# Patient Record
Sex: Female | Born: 1965 | Race: Black or African American | Hispanic: No | Smoking: Never smoker
Health system: Southern US, Community
[De-identification: ages and names within clinical notes are randomized; demographics above are authoritative.]

## PROBLEM LIST (undated history)

## (undated) DIAGNOSIS — R569 Unspecified convulsions: Secondary | ICD-10-CM

## (undated) DIAGNOSIS — M1990 Unspecified osteoarthritis, unspecified site: Secondary | ICD-10-CM

## (undated) DIAGNOSIS — J45909 Unspecified asthma, uncomplicated: Secondary | ICD-10-CM

## (undated) DIAGNOSIS — G4730 Sleep apnea, unspecified: Secondary | ICD-10-CM

## (undated) DIAGNOSIS — I509 Heart failure, unspecified: Secondary | ICD-10-CM

## (undated) DIAGNOSIS — Z86711 Personal history of pulmonary embolism: Secondary | ICD-10-CM

## (undated) DIAGNOSIS — F329 Major depressive disorder, single episode, unspecified: Secondary | ICD-10-CM

## (undated) DIAGNOSIS — E119 Type 2 diabetes mellitus without complications: Secondary | ICD-10-CM

## (undated) DIAGNOSIS — I2699 Other pulmonary embolism without acute cor pulmonale: Secondary | ICD-10-CM

## (undated) DIAGNOSIS — H3530 Unspecified macular degeneration: Secondary | ICD-10-CM

## (undated) DIAGNOSIS — Z7901 Long term (current) use of anticoagulants: Secondary | ICD-10-CM

## (undated) DIAGNOSIS — M5136 Other intervertebral disc degeneration, lumbar region: Secondary | ICD-10-CM

## (undated) DIAGNOSIS — I10 Essential (primary) hypertension: Secondary | ICD-10-CM

## (undated) DIAGNOSIS — F4310 Post-traumatic stress disorder, unspecified: Secondary | ICD-10-CM

## (undated) DIAGNOSIS — M51369 Other intervertebral disc degeneration, lumbar region without mention of lumbar back pain or lower extremity pain: Secondary | ICD-10-CM

## (undated) DIAGNOSIS — E7800 Pure hypercholesterolemia, unspecified: Secondary | ICD-10-CM

## (undated) DIAGNOSIS — I4892 Unspecified atrial flutter: Secondary | ICD-10-CM

## (undated) DIAGNOSIS — Z6841 Body Mass Index (BMI) 40.0 and over, adult: Secondary | ICD-10-CM

## (undated) DIAGNOSIS — G40909 Epilepsy, unspecified, not intractable, without status epilepticus: Secondary | ICD-10-CM

## (undated) DIAGNOSIS — K219 Gastro-esophageal reflux disease without esophagitis: Secondary | ICD-10-CM

## (undated) DIAGNOSIS — K567 Ileus, unspecified: Secondary | ICD-10-CM

## (undated) DIAGNOSIS — Z87828 Personal history of other (healed) physical injury and trauma: Secondary | ICD-10-CM

## (undated) DIAGNOSIS — R011 Cardiac murmur, unspecified: Secondary | ICD-10-CM

## (undated) DIAGNOSIS — K449 Diaphragmatic hernia without obstruction or gangrene: Secondary | ICD-10-CM

## (undated) DIAGNOSIS — I499 Cardiac arrhythmia, unspecified: Secondary | ICD-10-CM

## (undated) DIAGNOSIS — G4733 Obstructive sleep apnea (adult) (pediatric): Secondary | ICD-10-CM

## (undated) DIAGNOSIS — E66813 Obesity, class 3: Secondary | ICD-10-CM

## (undated) DIAGNOSIS — I4891 Unspecified atrial fibrillation: Secondary | ICD-10-CM

## (undated) DIAGNOSIS — F419 Anxiety disorder, unspecified: Secondary | ICD-10-CM

## (undated) DIAGNOSIS — Z95828 Presence of other vascular implants and grafts: Secondary | ICD-10-CM

## (undated) DIAGNOSIS — I639 Cerebral infarction, unspecified: Secondary | ICD-10-CM

## (undated) DIAGNOSIS — E559 Vitamin D deficiency, unspecified: Secondary | ICD-10-CM

## (undated) DIAGNOSIS — R519 Headache, unspecified: Secondary | ICD-10-CM

## (undated) DIAGNOSIS — J189 Pneumonia, unspecified organism: Secondary | ICD-10-CM

## (undated) DIAGNOSIS — R7303 Prediabetes: Secondary | ICD-10-CM

## (undated) DIAGNOSIS — F32A Depression, unspecified: Secondary | ICD-10-CM

## (undated) HISTORY — DX: Unspecified convulsions: R56.9

## (undated) HISTORY — PX: HERNIA REPAIR: SHX51

## (undated) HISTORY — DX: Other pulmonary embolism without acute cor pulmonale: I26.99

## (undated) HISTORY — PX: DIAGNOSTIC LAPAROSCOPY: SUR761

## (undated) HISTORY — PX: OTHER SURGICAL HISTORY: SHX169

## (undated) HISTORY — DX: Pure hypercholesterolemia, unspecified: E78.00

## (undated) HISTORY — DX: Diaphragmatic hernia without obstruction or gangrene: K44.9

## (undated) HISTORY — DX: Vitamin D deficiency, unspecified: E55.9

## (undated) HISTORY — DX: Unspecified osteoarthritis, unspecified site: M19.90

## (undated) HISTORY — PX: UPPER GI ENDOSCOPY: SHX6162

## (undated) HISTORY — PX: CHOLECYSTECTOMY: SHX55

## (undated) HISTORY — PX: HYSTERECTOMY: SHX81

---
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
## 2001-09-17 HISTORY — PX: TOTAL ABDOMINAL HYSTERECTOMY: SHX209

## 2001-09-17 HISTORY — PX: ABDOMINAL HYSTERECTOMY: SHX81

## 2001-09-17 HISTORY — PX: IVC FILTER INSERTION: CATH118245

## 2001-09-17 HISTORY — PX: CHOLECYSTECTOMY: SHX55

## 2015-09-18 DIAGNOSIS — G40909 Epilepsy, unspecified, not intractable, without status epilepticus: Secondary | ICD-10-CM

## 2015-09-18 HISTORY — PX: CARDIAC CATHETERIZATION: SHX172

## 2015-09-18 HISTORY — DX: Epilepsy, unspecified, not intractable, without status epilepticus: G40.909

## 2017-08-28 HISTORY — PX: SHOULDER ARTHROSCOPY W/ ROTATOR CUFF REPAIR: SHX2400

## 2018-02-03 ENCOUNTER — Emergency Department: Payer: Medicare Other

## 2018-02-03 ENCOUNTER — Observation Stay
Admission: EM | Admit: 2018-02-03 | Discharge: 2018-02-07 | Disposition: A | Discharge status: Home / Self Care | Payer: Medicare Other | Attending: Internal Medicine | Admitting: Internal Medicine

## 2018-02-03 DIAGNOSIS — R079 Chest pain, unspecified: Secondary | ICD-10-CM

## 2018-02-03 DIAGNOSIS — R2689 Other abnormalities of gait and mobility: Secondary | ICD-10-CM | POA: Insufficient documentation

## 2018-02-03 DIAGNOSIS — Z95828 Presence of other vascular implants and grafts: Secondary | ICD-10-CM | POA: Insufficient documentation

## 2018-02-03 DIAGNOSIS — Z86711 Personal history of pulmonary embolism: Secondary | ICD-10-CM | POA: Insufficient documentation

## 2018-02-03 DIAGNOSIS — E669 Obesity, unspecified: Secondary | ICD-10-CM | POA: Insufficient documentation

## 2018-02-03 DIAGNOSIS — Z794 Long term (current) use of insulin: Secondary | ICD-10-CM | POA: Insufficient documentation

## 2018-02-03 DIAGNOSIS — I509 Heart failure, unspecified: Secondary | ICD-10-CM | POA: Insufficient documentation

## 2018-02-03 DIAGNOSIS — Z6841 Body Mass Index (BMI) 40.0 and over, adult: Secondary | ICD-10-CM | POA: Insufficient documentation

## 2018-02-03 DIAGNOSIS — R0602 Shortness of breath: Secondary | ICD-10-CM | POA: Insufficient documentation

## 2018-02-03 DIAGNOSIS — I110 Hypertensive heart disease with heart failure: Secondary | ICD-10-CM | POA: Insufficient documentation

## 2018-02-03 DIAGNOSIS — R0789 Other chest pain: Secondary | ICD-10-CM | POA: Insufficient documentation

## 2018-02-03 DIAGNOSIS — Z7901 Long term (current) use of anticoagulants: Secondary | ICD-10-CM | POA: Insufficient documentation

## 2018-02-03 DIAGNOSIS — R55 Syncope and collapse: Principal | ICD-10-CM | POA: Insufficient documentation

## 2018-02-03 DIAGNOSIS — E119 Type 2 diabetes mellitus without complications: Secondary | ICD-10-CM | POA: Insufficient documentation

## 2018-02-03 HISTORY — DX: Sleep apnea, unspecified: G47.30

## 2018-02-03 HISTORY — DX: Heart failure, unspecified: I50.9

## 2018-02-03 HISTORY — DX: Unspecified osteoarthritis, unspecified site: M19.90

## 2018-02-03 LAB — COMPREHENSIVE METABOLIC PANEL
ALT: 12 U/L (ref 0–55)
AST (SGOT): 12 U/L (ref 5–34)
Albumin/Globulin Ratio: 0.9 (ref 0.9–2.2)
Albumin: 3.5 g/dL (ref 3.5–5.0)
Alkaline Phosphatase: 211 U/L — ABNORMAL HIGH (ref 37–106)
Anion Gap: 9 (ref 5.0–15.0)
BUN: 18 mg/dL (ref 7.0–19.0)
Bilirubin, Total: 0.3 mg/dL (ref 0.2–1.2)
CO2: 27 mEq/L (ref 22–29)
Calcium: 9.9 mg/dL (ref 8.5–10.5)
Chloride: 107 mEq/L (ref 100–111)
Creatinine: 0.9 mg/dL (ref 0.6–1.0)
Globulin: 3.8 g/dL — ABNORMAL HIGH (ref 2.0–3.6)
Glucose: 104 mg/dL — ABNORMAL HIGH (ref 70–100)
Potassium: 3.7 mEq/L (ref 3.5–5.1)
Protein, Total: 7.3 g/dL (ref 6.0–8.3)
Sodium: 143 mEq/L (ref 136–145)

## 2018-02-03 LAB — URINALYSIS, REFLEX TO MICROSCOPIC EXAM IF INDICATED
Bilirubin, UA: NEGATIVE
Blood, UA: NEGATIVE
Glucose, UA: NEGATIVE
Ketones UA: NEGATIVE
Leukocyte Esterase, UA: NEGATIVE
Nitrite, UA: NEGATIVE
Protein, UR: NEGATIVE
Specific Gravity UA: 1.011 (ref 1.001–1.035)
Urine pH: 6 (ref 5.0–8.0)
Urobilinogen, UA: NEGATIVE mg/dL

## 2018-02-03 LAB — CBC AND DIFFERENTIAL
Absolute NRBC: 0 10*3/uL (ref 0.00–0.00)
Basophils Absolute Automated: 0.03 10*3/uL (ref 0.00–0.08)
Basophils Automated: 0.4 %
Eosinophils Absolute Automated: 0.11 10*3/uL (ref 0.00–0.44)
Eosinophils Automated: 1.3 %
Hematocrit: 31.2 % — ABNORMAL LOW (ref 34.7–43.7)
Hgb: 9.8 g/dL — ABNORMAL LOW (ref 11.4–14.8)
Immature Granulocytes Absolute: 0.02 10*3/uL (ref 0.00–0.07)
Immature Granulocytes: 0.2 %
Lymphocytes Absolute Automated: 2.34 10*3/uL (ref 0.42–3.22)
Lymphocytes Automated: 28.5 %
MCH: 26.2 pg (ref 25.1–33.5)
MCHC: 31.4 g/dL — ABNORMAL LOW (ref 31.5–35.8)
MCV: 83.4 fL (ref 78.0–96.0)
MPV: 10.4 fL (ref 8.9–12.5)
Monocytes Absolute Automated: 0.63 10*3/uL (ref 0.21–0.85)
Monocytes: 7.7 %
Neutrophils Absolute: 5.08 10*3/uL (ref 1.10–6.33)
Neutrophils: 61.9 %
Nucleated RBC: 0 /100 WBC (ref 0.0–0.0)
Platelets: 257 10*3/uL (ref 142–346)
RBC: 3.74 10*6/uL — ABNORMAL LOW (ref 3.90–5.10)
RDW: 15 % (ref 11–15)
WBC: 8.21 10*3/uL (ref 3.10–9.50)

## 2018-02-03 LAB — GLUCOSE WHOLE BLOOD - POCT: Whole Blood Glucose POCT: 86 mg/dL (ref 70–100)

## 2018-02-03 LAB — HEMOLYSIS INDEX
Hemolysis Index: 0 (ref 0–18)
Hemolysis Index: 1 (ref 0–18)

## 2018-02-03 LAB — CK: Creatine Kinase (CK): 66 U/L (ref 29–168)

## 2018-02-03 LAB — LIPASE: Lipase: 45 U/L (ref 8–78)

## 2018-02-03 LAB — GFR: EGFR: >60

## 2018-02-03 LAB — IHS D-DIMER: D-Dimer: 0.36 ug/mL FEU (ref 0.00–0.70)

## 2018-02-03 LAB — TROPONIN I
Troponin I: 0.03 ng/mL (ref 0.00–0.09)
Troponin I: 0.01 ng/mL (ref 0.00–0.09)

## 2018-02-03 MED ORDER — INSULIN LISPRO 100 UNIT/ML SC SOLN
1.00 [IU] | Freq: Three times a day (TID) | SUBCUTANEOUS | Status: DC
Start: 2018-02-04 — End: 2018-02-07

## 2018-02-03 MED ORDER — RIVAROXABAN 10 MG PO TABS
10.00 mg | ORAL_TABLET | Freq: Every day | ORAL | Status: DC
Start: 2018-02-04 — End: 2018-02-04
  Filled 2018-02-03: qty 1

## 2018-02-03 MED ORDER — DEXTROSE 50 % IV SOLN
12.50 g | INTRAVENOUS | Status: DC | PRN
Start: 2018-02-03 — End: 2018-02-07

## 2018-02-03 MED ORDER — ONDANSETRON 4 MG PO TBDP
4.00 mg | ORAL_TABLET | Freq: Four times a day (QID) | ORAL | Status: DC | PRN
Start: 2018-02-03 — End: 2018-02-07

## 2018-02-03 MED ORDER — ACETAMINOPHEN 650 MG RE SUPP
650.00 mg | RECTAL | Status: DC | PRN
Start: 2018-02-03 — End: 2018-02-07

## 2018-02-03 MED ORDER — ACETAMINOPHEN 325 MG PO TABS
650.00 mg | ORAL_TABLET | ORAL | Status: DC | PRN
Start: 2018-02-03 — End: 2018-02-07
  Administered 2018-02-03 – 2018-02-05 (×4): 650 mg via ORAL
  Filled 2018-02-03 (×5): qty 2

## 2018-02-03 MED ORDER — ASPIRIN 81 MG PO CHEW
324.00 mg | CHEWABLE_TABLET | Freq: Once | ORAL | Status: AC
Start: 2018-02-03 — End: 2018-02-03
  Administered 2018-02-03: 19:00:00 324 mg via ORAL
  Filled 2018-02-03: qty 4

## 2018-02-03 MED ORDER — SODIUM CHLORIDE 0.9 % IV BOLUS
500.00 mL | Freq: Once | INTRAVENOUS | Status: AC
Start: 2018-02-03 — End: 2018-02-03
  Administered 2018-02-03: 20:00:00 500 mL via INTRAVENOUS

## 2018-02-03 MED ORDER — NALOXONE HCL 0.4 MG/ML IJ SOLN (WRAP)
0.20 mg | INTRAMUSCULAR | Status: DC | PRN
Start: 2018-02-03 — End: 2018-02-07

## 2018-02-03 MED ORDER — GLUCOSE 40 % PO GEL
15.00 g | ORAL | Status: DC | PRN
Start: 2018-02-03 — End: 2018-02-07

## 2018-02-03 MED ORDER — GLUCAGON 1 MG IJ SOLR (WRAP)
1.00 mg | INTRAMUSCULAR | Status: DC | PRN
Start: 2018-02-03 — End: 2018-02-07

## 2018-02-03 MED ORDER — FUROSEMIDE 20 MG PO TABS
80.00 mg | ORAL_TABLET | Freq: Two times a day (BID) | ORAL | Status: DC
Start: 2018-02-04 — End: 2018-02-07
  Administered 2018-02-04 – 2018-02-07 (×6): 80 mg via ORAL
  Filled 2018-02-03 (×3): qty 4
  Filled 2018-02-03: qty 3
  Filled 2018-02-03: qty 1
  Filled 2018-02-03 (×3): qty 4

## 2018-02-03 MED ORDER — ACETAMINOPHEN 325 MG PO TABS
650.00 mg | ORAL_TABLET | Freq: Once | ORAL | Status: DC | PRN
Start: 2018-02-03 — End: 2018-02-03

## 2018-02-03 MED ORDER — INSULIN LISPRO 100 UNIT/ML SC SOLN
1.00 [IU] | Freq: Every evening | SUBCUTANEOUS | Status: DC
Start: 2018-02-03 — End: 2018-02-07

## 2018-02-03 MED ORDER — ONDANSETRON HCL 4 MG/2ML IJ SOLN
4.00 mg | Freq: Once | INTRAMUSCULAR | Status: DC | PRN
Start: 2018-02-03 — End: 2018-02-03

## 2018-02-03 MED ORDER — ONDANSETRON HCL 4 MG/2ML IJ SOLN
4.00 mg | Freq: Four times a day (QID) | INTRAMUSCULAR | Status: DC | PRN
Start: 2018-02-03 — End: 2018-02-07

## 2018-02-03 NOTE — H&P (Signed)
[facility]      Patient: Veronica Franco  Date: 02/03/2018   DOB: August 18, 1966  Admission Date: 02/03/2018   MRN: [id number]  Attending: Marya Amsler         CC:  1. Syncope.     History Gathered From: Patient and ED physician.    HISTORY AND PHYSICAL     Cloie Onia Franco is a 52 y.o. 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    Patient states that she had syncopal episode today.  She was outside all day long, not eating or drinking, felt lightheaded, associated with chest pain and palpitation. She sat on the floor and passed out for a few minutes.  She did not hit her head.  When she woke up, she was not confused.  She denies tongue bite, urine/bowel incontinence.  She denies fever, chills, cold/flulike symptoms, nausea, vomiting, change in urinary/bowel habit. She denies weakness/numbness, swallowing or speech problems.     Past Medical History:   Diagnosis Date   . Arthritis, PE s/p IVC filter, DM 2    . Congestive heart failure        Past Surgical History:   Procedure Laterality Date   . CHOLECYSTECTOMY     . HERNIA REPAIR     . HYSTERECTOMY         Prior to Admission medications    Medication Sig Start Date End Date Taking? Authorizing Provider   furosemide (LASIX) 80 MG tablet Take 80 mg by mouth 2 (two) times daily   Yes [provider]   metFORMIN (GLUCOPHAGE) 500 MG tablet Take 500 mg by mouth 2 (two) times daily with meals   Yes [provider]   Multiple Vitamins-Minerals (MULTIVITAMIN WITH MINERALS) tablet Take 1 tablet by mouth daily   Yes [provider]   rivaroxaban (XARELTO) 10 MG Tab Take by mouth daily with dinner   Yes [provider]       Allergies   Allergen Reactions   . Codeine Hives       CODE STATUS: Full.     PRIMARY CARE MD: Patsy Lager, MD    FHx:  Dad: CAD in 3s.    Social History   Substance Use Topics   . Smoking status: Never Smoker   . Smokeless tobacco: Never Used    . Alcohol use No       REVIEW OF SYSTEMS   Positive for: As in HPI  Negative for: As in HPI  All ROS completed and otherwise negative.    PHYSICAL EXAM     Vital Signs (most recent): BP 123/57   Pulse 73   Temp 98 F (36.7 C) (Oral)   Resp 16   Ht 1.651 m (5\' 5" )   Wt 139.7 kg (308 lb) Comment: 3 weeks ago  SpO2 96%   BMI 51.25 kg/m   Constitutional: NAD. Patient speaks freely in full sentences.   HEENT: NC/AT, PERRL, no scleral icterus or conjunctival pallor, no nasal discharge, MMM.  Neck: trachea midline, supple.  Cardiovascular: RRR, normal S1 S2, no murmurs, gallops, palpable thrills, carotid bruit. + chronic lefs edema. Respiratory: Normal rate. No retractions or increased work of breathing. Clear to auscultation bilaterally.  Gastrointestinal: +BS, non-distended, soft, non-tender.  Genitourinary: no suprapubic tenderness.  Musculoskeletal: ROM and motor strength grossly normal.   Skin exam:  Normal.  Neurologic: EOMI, CN 2-12 grossly intact. no  gross motor or sensory deficits. Negative pronator drift.  Psychiatric: AAOx3, affect and mood appropriate. The patient is alert, interactive, appropriate.  Capillary refill:  Normal.    Exam done by Marya Amsler, MD on 02/03/18 at 9:22 PM    LABS & IMAGING     Recent Results (from the past 24 hour(s))   CBC with differential    Collection Time: 02/03/18  7:38 PM   Result Value Ref Range    WBC 8.21 3.10 - 9.50 x10 3/uL    Hgb 9.8 (L) 11.4 - 14.8 g/dL    Hematocrit 16.1 (L) 34.7 - 43.7 %    Platelets 257 142 - 346 x10 3/uL    RBC 3.74 (L) 3.90 - 5.10 x10 6/uL    MCV 83.4 78.0 - 96.0 fL    MCH 26.2 25.1 - 33.5 pg    MCHC 31.4 (L) 31.5 - 35.8 g/dL    RDW 15 11 - 15 %    MPV 10.4 8.9 - 12.5 fL    Neutrophils 61.9 None %    Lymphocytes Automated 28.5 None %    Monocytes 7.7 None %    Eosinophils Automated 1.3 None %    Basophils Automated 0.4 None %    Immature Granulocyte 0.2 None %    Nucleated RBC 0.0 0.0 - 0.0 /100 WBC    Neutrophils Absolute 5.08 1.10 -  6.33 x10 3/uL    Abs Lymph Automated 2.34 0.42 - 3.22 x10 3/uL    Abs Mono Automated 0.63 0.21 - 0.85 x10 3/uL    Abs Eos Automated 0.11 0.00 - 0.44 x10 3/uL    Absolute Baso Automated 0.03 0.00 - 0.08 x10 3/uL    Absolute Immature Granulocyte 0.02 0.00 - 0.07 x10 3/uL    Absolute NRBC 0.00 0.00 - 0.00 x10 3/uL   D-Dimer    Collection Time: 02/03/18  7:38 PM   Result Value Ref Range    D-Dimer 0.36 0.00 - 0.70 ug/mL FEU   Comprehensive metabolic panel    Collection Time: 02/03/18  7:38 PM   Result Value Ref Range    Glucose 104 (H) 70 - 100 mg/dL    BUN 09.6 7.0 - 04.5 mg/dL    Creatinine 0.9 0.6 - 1.0 mg/dL    Sodium 409 811 - 914 mEq/L    Potassium 3.7 3.5 - 5.1 mEq/L    Chloride 107 100 - 111 mEq/L    CO2 27 22 - 29 mEq/L    Calcium 9.9 8.5 - 10.5 mg/dL    Protein, Total 7.3 6.0 - 8.3 g/dL    Albumin 3.5 3.5 - 5.0 g/dL    AST (SGOT) 12 5 - 34 U/L    ALT 12 0 - 55 U/L    Alkaline Phosphatase 211 (H) 37 - 106 U/L    Bilirubin, Total 0.3 0.2 - 1.2 mg/dL    Globulin 3.8 (H) 2.0 - 3.6 g/dL    Albumin/Globulin Ratio 0.9 0.9 - 2.2    Anion Gap 9.0 5.0 - 15.0   Lipase    Collection Time: 02/03/18  7:38 PM   Result Value Ref Range    Lipase 45 8 - 78 U/L   Troponin I    Collection Time: 02/03/18  7:38 PM   Result Value Ref Range    Troponin I 0.03 0.00 - 0.09 ng/mL   Hemolysis index    Collection Time: 02/03/18  7:38 PM   Result Value Ref Range  Hemolysis Index 0 0 - 18   GFR    Collection Time: 02/03/18  7:38 PM   Result Value Ref Range    EGFR >60.0    CREATINE KINASE LEVEL (CK)    Collection Time: 02/03/18  7:38 PM   Result Value Ref Range    Creatine Kinase (CK) 66 29 - 168 U/L   Hemolysis index    Collection Time: 02/03/18  7:38 PM   Result Value Ref Range    Hemolysis Index 1 0 - 18   UA, Reflex to Microscopic (pts 3 + yrs)    Collection Time: 02/03/18  8:38 PM   Result Value Ref Range    Urine Type Clean Catch     Color, UA Yellow Clear - Yellow    Clarity, UA Hazy Clear - Hazy    Specific Gravity UA 1.011 1.001  - 1.035    Urine pH 6.0 5.0 - 8.0    Leukocyte Esterase, UA Negative Negative    Nitrite, UA Negative Negative    Protein, UR Negative Negative    Glucose, UA Negative Negative    Ketones UA Negative Negative    Urobilinogen, UA Negative 0.2 - 2.0 mg/dL    Bilirubin, UA Negative Negative    Blood, UA Negative Negative       MICROBIOLOGY:  Blood Culture: NA  Urine Culture: NA  Antibiotics Started: N    IMAGING:  Upon my review:   1. CXR shows cardiomegaly with venous congestion.    CARDIAC:  EKG Interpretation (upon my review):  NSR. TWI in V1-2. First degree AVB.    Markers:    Recent Labs  Lab 02/03/18  1938   Creatine Kinase (CK) 66   Troponin I 0.03       EMERGENCY DEPARTMENT COURSE:  Orders Placed This Encounter   Procedures   . XR Chest  AP Portable   . CBC with differential   . D-Dimer   . Comprehensive metabolic panel   . Lipase   . Troponin I   . UA, Reflex to Microscopic (pts 3 + yrs)   . Hemolysis index   . GFR   . CREATINE KINASE LEVEL (CK)   . Hemolysis index   . Basic Metabolic Panel   . CBC   . Troponin I   . Diet consistent carbohydrate   . Pulse Oximetry   . Progressive Mobility Protocol   . Notify physician   . I/O   . Height   . Weight   . Skin assessment   . Place sequential compression device   . Maintain sequential compression device   . Education: Activity   . Education: Disease Process & Condition   . Education: Pain Management   . Education: Falls Risk   . Telemetry 24 Hour Protocol   . Full Code   . ED [employer]   . ECG 12 lead   . Place (admit) for Observation Services   . [facility] ED Bed Request (Observation)       ASSESSMENT & PLAN     Anureet Deasia Chiu is a 52 y.o. 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    Patient Active Hospital Problem List: 02/03/18    1. Syncope and collapse  2. CP  3. Palpitation  4. PE s/p IVC filter  5. CHF: unspecify  6. DM2    DDx; Dehydration (was outside all day  long with  decrease PO intake), vasovagal syncope, cardiogenic. Less likely seizure activity-no postictal confusion, jerky movement, CPK not elevated.    -Reviewed CXR: shows cardiomegaly with venous congestion  -Reviewed EKG: NSR. TWI in V1-2. First degree AVB. No prior EKG to compare.  -Telemetry monitoring.  -Orthostatic vital signs  -IVF  -Fall precaution.  -FU troponin.  -CSI with hypoglycemia protocol.  -Not in CHF exacerbation, continue home meds.    Nutrition  2 gms sodium diet, water restriction and consistent carb diet    DVT/VTE Prophylaxis  SCD/Heparin.    Anticipated medical stability for discharge: 1-2 days.    Service status/Reason for ongoing hospitalization: Observation/syncope.  Anticipated Discharge Needs: To be determined.    Signed,  Marya Amsler    Time Elapsed: 45 min.

## 2018-02-03 NOTE — ED Notes (Signed)
NURSING NOTE FOR THE RECEIVING INPATIENT NURSE   ED NURSE [phone]   [facility] Veronica Franco   ADMISSION INFORMATION   Veronica Franco is a 52 y.o. female admitted with a diagnosis of:    1. Syncope and collapse    2. Chest pain in adult       Isolation: None  Holding Orders Confirmed:  Will confirm   NURSING CARE   Mental Status alert and oriented   ADL ADLs:            Needs assistance with ADLs  Ambulation:  mild difficulty   Pertinent Information  and Safety Concerns Pt had syncopal episode pta and is feeling weak. Pt able to ambulate to bathroom. Will update vitals before taking pt upstairs.       VITAL SIGNS     Time of Last Set of Vitals 1930   Temperature 98.1   BP 106/55   Pulse 79   Respirations 18   Pulse OX 97        IV LINES     IV Catheter Size: 20 g    Peripheral IV 02/03/18 Left Antecubital (Active)   Site Assessment Clean;Dry;Intact 02/03/2018  8:03 PM   Line Status Infusing 02/03/2018  8:03 PM   Dressing Status Clean;Dry;Intact 02/03/2018  8:03 PM   Number of days: 0          LAB RESULTS     Labs Reviewed   CBC AND DIFFERENTIAL - Abnormal; Notable for the following:        Result Value    Hgb 9.8 (*)     Hematocrit 31.2 (*)     RBC 3.74 (*)     MCHC 31.4 (*)     All other components within normal limits   COMPREHENSIVE METABOLIC PANEL - Abnormal; Notable for the following:     Glucose 104 (*)     Alkaline Phosphatase 211 (*)     Globulin 3.8 (*)     All other components within normal limits   [facility] D-DIMER   LIPASE   TROPONIN I   HEMOLYSIS INDEX   GFR   CK   HEMOLYSIS INDEX   URINALYSIS, REFLEX TO MICROSCOPIC EXAM IF INDICATED

## 2018-02-03 NOTE — ED Provider Notes (Signed)
EMERGENCY DEPARTMENT HISTORY AND PHYSICAL EXAM     Physician/Midlevel provider first contact with patient: 02/03/18 1810         Date: 02/03/2018  Patient Name: Veronica Franco    History of Presenting Illness     Chief Complaint   Patient presents with   . Heat Exposure       History Provided By: Patient    Chief Complaint: Syncope  Onset: 5 PM today   Timing:  Acute  Location: Neuro  Quality: NA  Severity: Moderate  Exacerbating factors: None.   Alleviating factors: None.   Associated Symptoms: Lightheaded, CP, heart palpitations, SOB, and diaphoresis  Pertinent Negatives: Denies h/o CAD    Additional History: Veronica Franco is a 52 y.o. female h/o CHF and asthma BIBA to the ED with syncopal episode after feeling lightheaded, diaphoretic, chest tightness, heart palpitations, and SOB at 5PM tonight. She denies h/o CAD. Pt reports that she was running errands all day, which required her to travel by bus, and hasn't been eating/drinking as much today. When she had sxs, her daughter had her sit down outside a restaurant and pt did not fall.     PCP: Pcp, [place], MD  SPECIALISTS:    No current facility-administered medications for this encounter.      Current Outpatient Prescriptions   Medication Sig Dispense Refill   . furosemide (LASIX) 80 MG tablet Take 80 mg by mouth 2 (two) times daily     . metFORMIN (GLUCOPHAGE) 500 MG tablet Take 500 mg by mouth 2 (two) times daily with meals     . Multiple Vitamins-Minerals (MULTIVITAMIN WITH MINERALS) tablet Take 1 tablet by mouth daily     . rivaroxaban (XARELTO) 10 MG Tab Take by mouth daily with dinner         Past History     Past Medical History:  Past Medical History:   Diagnosis Date   . Arthritis    . Congestive heart failure        Past Surgical History:  Past Surgical History:   Procedure Laterality Date   . CHOLECYSTECTOMY     . HERNIA REPAIR     . HYSTERECTOMY         Family History:  History reviewed. No pertinent family history.    Social  History:  Social History   Substance Use Topics   . Smoking status: Never Smoker   . Smokeless tobacco: Never Used   . Alcohol use No       Allergies:  Allergies   Allergen Reactions   . Codeine Hives       Review of Systems     Review of Systems   Constitutional: Positive for diaphoresis. Negative for activity change and fever.   HENT: Negative for trouble swallowing.    Eyes: Negative for discharge.   Respiratory: Positive for shortness of breath. Negative for cough.    Cardiovascular: Positive for chest pain and palpitations.   Gastrointestinal: Negative for abdominal pain, nausea and vomiting.   Genitourinary: Negative for dysuria.   Musculoskeletal: Negative for neck pain.   Skin: Negative for rash.   Neurological: Positive for syncope. Negative for headaches.   Psychiatric/Behavioral: Negative for confusion.       Physical Exam   BP 106/55   Pulse 79   Temp 98.1 F (36.7 C)   Resp 16   Ht 5\' 5"  (1.651 m)   Wt 139.7 kg Comment: 3 weeks ago  SpO2 97%  BMI 51.25 kg/m     Physical Exam   Constitutional: She is oriented to person, place, and time. She appears well-developed.   obese   HENT:   Head: Normocephalic.   Mouth/Throat: Oropharynx is clear and moist.   Eyes: Pupils are equal, round, and reactive to light. Conjunctivae are normal.   Neck: Normal range of motion.   Cardiovascular: Normal rate, regular rhythm and normal heart sounds.    Pulmonary/Chest: Effort normal and breath sounds normal.   Abdominal: Soft. There is no tenderness.   Musculoskeletal: Normal range of motion.   Neurological: She is alert and oriented to person, place, and time.   Skin: Skin is warm.   Psychiatric: She has a normal mood and affect.   Nursing note and vitals reviewed.        Diagnostic Study Results     Labs -     Results     Procedure Component Value Units Date/Time    UA, Reflex to Microscopic (pts 3 + yrs) [[id number]] Collected:  02/03/18 2038    Specimen:  Urine Updated:  02/03/18 2038    CREATINE KINASE LEVEL  (CK) [[id number]] Collected:  02/03/18 1938    Specimen:  Blood Updated:  02/03/18 2033     Creatine Kinase (CK) 66 U/L     Hemolysis index [[id number]] Collected:  02/03/18 1938     Updated:  02/03/18 2033     Hemolysis Index 1    Troponin I [[id number]] Collected:  02/03/18 1938    Specimen:  Blood Updated:  02/03/18 2020     Troponin I 0.03 ng/mL     Comprehensive metabolic panel [[id number]]  (Abnormal) Collected:  02/03/18 1938    Specimen:  Blood Updated:  02/03/18 2014     Glucose 104 (H) mg/dL      BUN 87.5 mg/dL      Creatinine 0.9 mg/dL      Sodium 643 mEq/L      Potassium 3.7 mEq/L      Chloride 107 mEq/L      CO2 27 mEq/L      Calcium 9.9 mg/dL      Protein, Total 7.3 g/dL      Albumin 3.5 g/dL      AST (SGOT) 12 U/L      ALT 12 U/L      Alkaline Phosphatase 211 (H) U/L      Bilirubin, Total 0.3 mg/dL      Globulin 3.8 (H) g/dL      Albumin/Globulin Ratio 0.9     Anion Gap 9.0    Lipase [[id number]] Collected:  02/03/18 1938    Specimen:  Blood Updated:  02/03/18 2014     Lipase 45 U/L     Hemolysis index [[id number]] Collected:  02/03/18 1938     Updated:  02/03/18 2014     Hemolysis Index 0    GFR [[id number]] Collected:  02/03/18 1938     Updated:  02/03/18 2014     EGFR >60.0    D-Dimer [[id number]] Collected:  02/03/18 1938     Updated:  02/03/18 2005     D-Dimer 0.36 ug/mL FEU     CBC with differential [[id number]]  (Abnormal) Collected:  02/03/18 1938    Specimen:  Blood from Blood Updated:  02/03/18 1956     WBC 8.21 x10 3/uL      Hgb 9.8 (L) g/dL      Hematocrit 15.1 (L) %      Platelets 257  x10 3/uL      RBC 3.74 (L) x10 6/uL      MCV 83.4 fL      MCH 26.2 pg      MCHC 31.4 (L) g/dL      RDW 15 %      MPV 10.4 fL      Neutrophils 61.9 %      Lymphocytes Automated 28.5 %      Monocytes 7.7 %      Eosinophils Automated 1.3 %      Basophils Automated 0.4 %      Immature Granulocyte 0.2 %      Nucleated RBC 0.0 /100 WBC      Neutrophils Absolute 5.08 x10 3/uL      Abs Lymph Automated 2.34 x10 3/uL      Abs  Mono Automated 0.63 x10 3/uL      Abs Eos Automated 0.11 x10 3/uL      Absolute Baso Automated 0.03 x10 3/uL      Absolute Immature Granulocyte 0.02 x10 3/uL      Absolute NRBC 0.00 x10 3/uL           Radiologic Studies -   Radiology Results (24 Hour)     Procedure Component Value Units Date/Time    XR Chest  AP Portable [[id number]] Collected:  02/03/18 1925    Order Status:  Completed Updated:  02/03/18 1930    Narrative:       History: chest pain    Technique: Single Portable View    Comparison: None.    Findings:  The lungs appear clear, except for some vascular crowding bilaterally,  related to a suboptimal inspiration.   There is no pneumothorax.  The heart is normal in size.    The mediastinum is within normal limits.             Impression:        No active disease is seen in the chest.    Laurena Slimmer, MD   02/03/2018 7:26 PM      .    Medical Decision Making   I am the first provider for this patient.    I reviewed the vital signs, available nursing notes, past medical history, past surgical history, family history and social history.    Vital Signs-Reviewed the patient's vital signs.     Patient Vitals for the past 12 hrs:   BP Temp Pulse Resp   02/03/18 1930 106/55 - 79 16   02/03/18 1808 110/50 98.1 F (36.7 C) 88 17       Pulse Oximetry Analysis - Normal 97% on RA    Cardiac Monitor:  Rate: 77  Rhythm: Sinus Rhythm w/ 1st degree AVB    EKG:  Interpreted by the EP.   Time Interpreted: 76   Rate: 77   Rhythm: Sinus Rhythm w/ 1st degree AVB   Interpretation: No ST changes   Comparison: No prior study is available for comparison.    Clinical Decision Support:       Old Medical Records: Nursing notes.     ED Course:     6:38 PM - Discussed plan for IVF, ASA, blood work, UA, and CXR in ED. Pt is agreeable.     8:37 PM - Updated pt on results. Discussed plan for hospitalization. Pt is agreeable.     8:55 PM - Discussed pt's case with Dr. Greig Castilla [facility] hospitalist) who accepts pt for IMCU, observation  hospitalization.  Provider Notes: 52 yo female with h/o DM, CHF presents s/p syncopal episode after walking around in the sun all day.  Did have CP and SOB prior to episode.  No head injury.  EKG without stigmata of arrhythmia.  Labs reassuring, but given history of CHF and chest pain prior to syncope will admit for continued eval of syncope    Critical Care Time:    For Hospitalized Patients:    1. Hospitalization Decision Time:  The decision to admit this patient was made by the emergency provider at 8:55 PM on 02/03/2018     2. Aspirin: Aspirin was given    Diagnosis     Clinical Impression:   1. Syncope and collapse    2. Chest pain in adult        Treatment Plan:   ED Disposition     ED Disposition Condition Date/Time Comment    Observation  Mon Feb 03, 2018  8:56 PM Admitting Physician: Marya Amsler [[id number]]   Diagnosis: Syncope and collapse [780.2.ICD-9-CM]   Estimated Length of Stay: < 2 midnights   Tentative Discharge Plan?: Home or Self Care [1]   Patient Class: Observation [104]              _______________________________      Attestations: This note is prepared by Dillard Essex, acting as scribe for Caroline Sauger, MD.      Caroline Sauger, MD - The scribe's documentation has been prepared under my direction and personally reviewed by me in its entirety.  I confirm that the note above accurately reflects all work, treatment, procedures, and medical decision making performed by me.    _______________________________     Ashley Jacobs, MD  02/04/18 [phone]

## 2018-02-03 NOTE — ED Notes (Signed)
ED Nursing Note For The Receiving Inpatient Nurse - UPDATE     The following information is an update to the previous ED Nursing Admission Note:    Updated VS are as follows:   Visit Vitals  BP 123/57   Pulse 73   Temp 98 F (36.7 C) (Oral)   Resp 16   Ht 5\' 5"  (1.651 m)   Wt 139.7 kg   SpO2 96%   BMI 51.25 kg/m

## 2018-02-03 NOTE — ED Notes (Signed)
Bed: BL20  Expected date: 02/03/18  Expected time: 5:45 PM  Means of arrival: [facility]  Comments:  [facility] 825

## 2018-02-03 NOTE — ED Triage Notes (Addendum)
Pt BIBA. Pt running errands all day, began to feel overheated, blrry vision approx 10 mins then resolved.   Pt reports not well hydrated.   Per daughter, pt slumped over while seated outside restaurant. Approx LOC 2 mins, daughter held pt, pt did not fall or hit her head.

## 2018-02-04 ENCOUNTER — Observation Stay: Payer: Medicare Other

## 2018-02-04 DIAGNOSIS — R079 Chest pain, unspecified: Secondary | ICD-10-CM

## 2018-02-04 LAB — GLUCOSE WHOLE BLOOD - POCT
Whole Blood Glucose POCT: 93 mg/dL (ref 70–100)
Whole Blood Glucose POCT: 114 mg/dL — ABNORMAL HIGH (ref 70–100)
Whole Blood Glucose POCT: 105 mg/dL — ABNORMAL HIGH (ref 70–100)

## 2018-02-04 LAB — BASIC METABOLIC PANEL
Anion Gap: 6 (ref 5.0–15.0)
BUN: 18 mg/dL (ref 7.0–19.0)
CO2: 30 mEq/L — ABNORMAL HIGH (ref 22–29)
Calcium: 9.6 mg/dL (ref 8.5–10.5)
Chloride: 108 mEq/L (ref 100–111)
Creatinine: 1 mg/dL (ref 0.6–1.0)
Glucose: 112 mg/dL — ABNORMAL HIGH (ref 70–100)
Potassium: 3.7 mEq/L (ref 3.5–5.1)
Sodium: 144 mEq/L (ref 136–145)

## 2018-02-04 LAB — CBC
Absolute NRBC: 0 10*3/uL (ref 0.00–0.00)
Hematocrit: 29.9 % — ABNORMAL LOW (ref 34.7–43.7)
Hgb: 9.2 g/dL — ABNORMAL LOW (ref 11.4–14.8)
MCH: 26.4 pg (ref 25.1–33.5)
MCHC: 30.8 g/dL — ABNORMAL LOW (ref 31.5–35.8)
MCV: 85.9 fL (ref 78.0–96.0)
MPV: 10.7 fL (ref 8.9–12.5)
Nucleated RBC: 0 /100 WBC (ref 0.0–0.0)
Platelets: 228 10*3/uL (ref 142–346)
RBC: 3.48 10*6/uL — ABNORMAL LOW (ref 3.90–5.10)
RDW: 15 % (ref 11–15)
WBC: 5.55 10*3/uL (ref 3.10–9.50)

## 2018-02-04 LAB — HEMOGLOBIN A1C
Average Estimated Glucose: 116.9 mg/dL
Hemoglobin A1C: 5.7 % (ref 4.6–5.9)

## 2018-02-04 LAB — GFR: EGFR: >60

## 2018-02-04 LAB — TROPONIN I: Troponin I: <0.01 ng/mL (ref 0.00–0.09)

## 2018-02-04 LAB — HEMOLYSIS INDEX: Hemolysis Index: 5 (ref 0–18)

## 2018-02-04 MED ORDER — RIVAROXABAN 20 MG PO TABS
20.00 mg | ORAL_TABLET | Freq: Every day | ORAL | Status: DC
Start: 2018-02-04 — End: 2018-02-07
  Administered 2018-02-04 – 2018-02-07 (×4): 20 mg via ORAL
  Filled 2018-02-04 (×4): qty 1

## 2018-02-04 MED ORDER — KETOROLAC TROMETHAMINE 15 MG/ML IJ SOLN
15.00 mg | Freq: Once | INTRAMUSCULAR | Status: AC
Start: 2018-02-04 — End: 2018-02-04
  Administered 2018-02-04: 09:00:00 15 mg via INTRAVENOUS
  Filled 2018-02-04: qty 1

## 2018-02-04 NOTE — Progress Notes (Signed)
Orthostatic BP taken this AM. Pt denies SOB, Dizziness.     02/04/18 0525 02/04/18 0528 02/04/18 0533   Vital Signs   Heart Rate (!) 53 73 --    BP 119/67 119/65 122/77   BP Location Right forearm Right forearm Right forearm   BP Method Automatic Automatic Automatic   Patient Position Lying Sitting Standing   Pt reports HA 10/10 and CP 10/10 that is dull at mid chest and Left arm tightness after walking back from bathroom. Tylenol provided. Monitoring pain and VS. HA went down to 8/10 and CP down to 5/10.

## 2018-02-04 NOTE — Progress Notes (Signed)
Nutritional Support Services  Nutrition Note    Veronica Franco 52 y.o. female   MRN: [id number]      Referral Source: RN Screen  Reason for Referral: MST 2 - wt loss and decreased appetite     Diet: Cardiac     Education Provided: RDN discussed pt's current diet order and how pt is compliant/improving with compliance for recommendations. Strong compliance predicted with ongoing nutrition counseling.     Clinical Nutrition Summary: Pt is a 52 yo female with syncope and collapse, DM type 2, DVT/VTE prophylaxis per MD assessment.     Pt was awake and alert at time of assessment and able to answer all nutrition related questions. Pt reports poor PO intake x a few days PTA 2/2 loss of appetite. Pt reports that her appetite has never been "normal", clarifying that she has never had much of an appetite. Pt does report that she has started working with a nutritionist to help with dietary compliance and wt loss. Pt reports wt loss, but has been intentional; estimates she has lost ~50 lbs over unspecified time-frame.  Pt states that at one point, she was close to 400 lbs; currently 319 lbs. Pt reports that she has had discussions regarding bariatric surgery in the past, but is against surgery and would like to continue to lose wt on her own. Pt reports that she checks her wt daily 2/2 CHF and fluid retention; noted, pt with no edema per flowsheets. Pt with history of DM, but does not check her BG at home. Pt reports that her PCP states she does not need to check her BG regularly because they are so well controlled. RDN discussed elevated BG with pt (112 mg/dL); noted both POCTBG have been < 100. HgbA1c ordered to determine DM control. Pt with no reports of chewing/swallowing or altered GI function. Per flowsheets, GI WDL w/ last documented BM 5/40. Labs reviewed; BG 112, HgbA1c ordered. Meds reviewed; lasix, Humalog (1-3 units) at bedtime, Humalog (1-5 units) TID before meals. Pt to remain on cardiac diet at this time. If  BG continues to be elevated, recommend adding consistent carbohydrate restriction.       Patient is at low nutritional risk; will be available within 10 days and PRN.    Buzzy Han, MS, RDN  Office: [phone]  [employer] [phone]

## 2018-02-04 NOTE — UM Notes (Signed)
Primary Payor: MEDICARE PART A AND B  Secondary Payor: [place]ICA[place]    02/03/18 2056  Place (admit) for Observation Services (Adult Observation Admit Panel (AX)) Once         Clinical Impression:   1. Syncope and collapse   2. Chest pain in adult     Veronica Franco is a 52 y.o. female h/o CHF and asthma BIBA to the ED with syncopal episode after feeling lightheaded, diaphoretic, chest tightness, heart palpitations, and SOB at 5PM tonight. She denies h/o CAD. Pt reports that she was running errands all day, which required her to travel by bus, and hasn't been eating/drinking as much today. When she had sxs, her daughter had her sit down outside a restaurant and pt did not fall.     Past Medical History:  Diagnosis Date  . Arthritis   . Congestive heart failure   . Sleep apnea     BP 114/70   Pulse (!) 58   Temp 98.6 F (37 C) (Oral)   Resp 16   Ht 1.651 m (5\' 5" )   Wt 144 kg (317 lb 8 oz)   SpO2 100%   BMI 52.83 kg/m     Temp:  [96.3 F (35.7 C)-98.6 F (37 C)]   Heart Rate:  [53-88]   Resp Rate:  [16-18]   BP: (106-123)/(50-77)   SpO2:  [95 %-100 %]   Height:  [165.1 cm (5\' 5" )]   Weight:  [139.7 kg (308 lb)-144 kg (317 lb 8 oz)]   BMI (calculated):  [51.4-52.9]     Last recorded pain score: 10/10  O2: 95% RA     Lab Results last 48 Hours     Procedure Component Value Units Date/Time    Troponin I [[id number]] Collected:  02/04/18 0439    Specimen:  Blood Updated:  02/04/18 0545     Troponin I <0.01 ng/mL     Basic Metabolic Panel [[id number]]  (Abnormal) Collected:  02/04/18 0439    Specimen:  Blood Updated:  02/04/18 0538     Glucose 112 (H) mg/dL      BUN 78.2 mg/dL      Creatinine 1.0 mg/dL      Calcium 9.6 mg/dL      Sodium 956 mEq/L      Potassium 3.7 mEq/L      Chloride 108 mEq/L      CO2 30 (H) mEq/L      Anion Gap 6.0    Hemolysis index [[id number]] Collected:  02/04/18 0439     Updated:  02/04/18 0538     Hemolysis Index 5    GFR [[id number]] Collected:  02/04/18 0439     Updated:   02/04/18 0538     EGFR >60.0    CBC [[id number]]  (Abnormal) Collected:  02/04/18 0439    Specimen:  Blood from Blood Updated:  02/04/18 0525     WBC 5.55 x10 3/uL      Hgb 9.2 (L) g/dL      Hematocrit 27.2 (L) %      Platelets 228 x10 3/uL      RBC 3.48 (L) x10 6/uL      MCV 85.9 fL      MCH 26.4 pg      MCHC 30.8 (L) g/dL      RDW 15 %      MPV 10.7 fL      Nucleated RBC 0.0 /100 WBC      Absolute NRBC 0.00  x10 3/uL     Troponin I [[id number]] Collected:  02/03/18 2252    Specimen:  Blood Updated:  02/03/18 2318     Troponin I 0.01 ng/mL     Glucose Whole Blood - POCT [[id number]] Collected:  02/03/18 2243     Updated:  02/03/18 2254     POCT - Glucose Whole blood 86 mg/dL     UA, Reflex to Microscopic (pts 3 + yrs) [[id number]] Collected:  02/03/18 2038    Specimen:  Urine Updated:  02/03/18 2110     Urine Type Clean Catch     Color, UA Yellow     Clarity, UA Hazy     Specific Gravity UA 1.011     Urine pH 6.0     Leukocyte Esterase, UA Negative     Nitrite, UA Negative     Protein, UR Negative     Glucose, UA Negative     Ketones UA Negative     Urobilinogen, UA Negative mg/dL      Bilirubin, UA Negative     Blood, UA Negative    CREATINE KINASE LEVEL (CK) [[id number]] Collected:  02/03/18 1938    Specimen:  Blood Updated:  02/03/18 2033     Creatine Kinase (CK) 66 U/L     Hemolysis index [[id number]] Collected:  02/03/18 1938     Updated:  02/03/18 2033     Hemolysis Index 1    Troponin I [[id number]] Collected:  02/03/18 1938    Specimen:  Blood Updated:  02/03/18 2020     Troponin I 0.03 ng/mL     Comprehensive metabolic panel [[id number]]  (Abnormal) Collected:  02/03/18 1938    Specimen:  Blood Updated:  02/03/18 2014     Glucose 104 (H) mg/dL      BUN 74.2 mg/dL      Creatinine 0.9 mg/dL      Sodium 595 mEq/L      Potassium 3.7 mEq/L      Chloride 107 mEq/L      CO2 27 mEq/L      Calcium 9.9 mg/dL      Protein, Total 7.3 g/dL      Albumin 3.5 g/dL      AST (SGOT) 12 U/L      ALT 12 U/L      Alkaline Phosphatase  211 (H) U/L      Bilirubin, Total 0.3 mg/dL      Globulin 3.8 (H) g/dL      Albumin/Globulin Ratio 0.9     Anion Gap 9.0    Lipase [[id number]] Collected:  02/03/18 1938    Specimen:  Blood Updated:  02/03/18 2014     Lipase 45 U/L     Hemolysis index [[id number]] Collected:  02/03/18 1938     Updated:  02/03/18 2014     Hemolysis Index 0    GFR [[id number]] Collected:  02/03/18 1938     Updated:  02/03/18 2014     EGFR >60.0    D-Dimer [[id number]] Collected:  02/03/18 1938     Updated:  02/03/18 2005     D-Dimer 0.36 ug/mL FEU     CBC with differential [[id number]]  (Abnormal) Collected:  02/03/18 1938    Specimen:  Blood from Blood Updated:  02/03/18 1956     WBC 8.21 x10 3/uL      Hgb 9.8 (L) g/dL      Hematocrit 62.3 (L) %      Platelets 257 x10 3/uL  RBC 3.74 (L) x10 6/uL      MCV 83.4 fL      MCH 26.2 pg      MCHC 31.4 (L) g/dL      RDW 15 %      MPV 10.4 fL      Neutrophils 61.9 %      Lymphocytes Automated 28.5 %      Monocytes 7.7 %      Eosinophils Automated 1.3 %      Basophils Automated 0.4 %      Immature Granulocyte 0.2 %      Nucleated RBC 0.0 /100 WBC      Neutrophils Absolute 5.08 x10 3/uL      Abs Lymph Automated 2.34 x10 3/uL      Abs Mono Automated 0.63 x10 3/uL      Abs Eos Automated 0.11 x10 3/uL      Absolute Baso Automated 0.03 x10 3/uL      Absolute Immature Granulocyte 0.02 x10 3/uL      Absolute NRBC 0.00 x10 3/uL         MD Note 02/03/18  ASSESSMENT & PLAN  Veronica Franco is a 52 y.o. 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  Patient Active Hospital Problem List: 02/03/18  1. Syncope and collapse  2. CP  3. Palpitation  4. PE s/p IVC filter  5. CHF: unspecify  6. DM2  DDx; Dehydration (was outside all day long with decrease PO intake), vasovagal syncope, cardiogenic. Less likely seizure activity-no postictal confusion, jerky movement, CPK not elevated.  -Reviewed CXR: shows cardiomegaly with venous congestion  -Reviewed EKG:  NSR. TWI in V1-2. First degree AVB. No prior EKG to compare.  -Telemetry monitoring.  -Orthostatic vital signs  -IVF  -Fall precaution.  -FU troponin.  -CSI with hypoglycemia protocol.  -Not in CHF exacerbation, continue home meds.  Nutrition  2 gms sodium diet, water restriction and consistent carb diet  DVT/VTE Prophylaxis  SCD/Heparin.  Anticipated medical stability for discharge: 1-2 days.  Service status/Reason for ongoing hospitalization: Observation/syncope.    Orders  Pulse Ox, I/O, V/S, Tele, PT/OT, SCD    Scheduled Meds:  Current Facility-Administered Medications   Medication Dose Route Frequency   . furosemide  80 mg Oral BID   . insulin lispro  1-3 Units Subcutaneous QHS   . insulin lispro  1-5 Units Subcutaneous TID AC   . rivaroxaban  20 mg Oral Daily with dinner     Continuous Infusions:  PRN Meds:.acetaminophen **OR** acetaminophen, Nursing communication: Adult Hypoglycemia Treatment Algorithm **AND** dextrose **AND** dextrose **AND** glucagon (rDNA), naloxone, ondansetron **OR** ondansetron   aspirin chewable tablet 324 mg : Dose 324 mg : Oral : Once   ketorolac (TORADOL) injection 15 mg : Dose 15 mg : Intravenous : Once  sodium chloride 0.9 % bolus 500 mL : Dose 500 mL : 500 mL/hr : Intravenous : Once    Ky Barban  MS Health Informatics/Healthcare Administration, Cert. Pension scheme manager, Comptroller, BS Biology   Utilization Review Case Manager  Case Management Department   [facility]  [address]  [place], [place] [address]  T [phone] Judie Petit [phone]  [email or web address]  Please submit all clinical review request via fax to [phone]

## 2018-02-04 NOTE — Plan of Care (Signed)
Problem: Occupational Therapy  Goal: By discharge, patient will perform self-care at the patient's highest functional potential.  See OT evaluation/note for goals.  Outcome: Adequate for Discharge  Discharge Recommendation: Home with home health PT (continue [facility] PT that she was participating in)   DME Recommended for Discharge: Sock aid, [facility]    OT Frequency Recommended: one time visit     Is PT evaluation indicated at this time? No, the patient does not require a PT evaluation.    PMP Activity: Step 7 - Walks out of Room  Distance Walked (ft) (Step 6,7): 120 Feet (including up & down flight of stairs)  (Please See Therapy Evaluation for device and assistance level needed)    Treatment Interventions: Functional transfer training, Endurance training, Equipment eval/education, Compensatory technique education       Goal Formulation: Patient  Time For Goal Achievement: 1 visit     Mobility and Transfer Goals  Other Goal: Pt will ascend/descend 12 steps w/ supervision to access ADL items, VSS, goal met

## 2018-02-04 NOTE — Plan of Care (Signed)
Problem: Everyday - Heart Failure  Goal: Stable Vital Signs and Fluid Balance  Outcome: Progressing   02/04/18 1355   Goal/Interventions addressed this shift   Stable Vital Signs and Fluid Balance Daily Standing Weights in the morning using the same scale, after using the bathroom and before breadfast. If unable to stand, zero the bed and use the bed scale;Monitor, assess vital signs and telemetry per policy;Assess for swelling/edema;Wean oxygen as needed if appropriate;Monitor labs and report abnormalities to physician;Strict Intake/Output;Fluid Restriction     Goal: Mobility/Activity is Maintained at Optimal Level for Patient  Outcome: Progressing   02/04/18 1355   Goal/Interventions addressed this shift   Mobility/Activity is Maintained at Optimal Level for Patient Increase mobility as tolerated/progressive mobility protocol;Perform active/passive ROM     Goal: Nutritional Intake is Adequate  Outcome: Progressing   02/04/18 1355   Goal/Interventions addressed this shift   Nutritional Intake is Adequate Cardiac diet-2 gm Sodium;Fluid Restricction if needed;Consult/Collaborate with Nutritionist;Patient and family teaching on low sodium diet;Assess appetite,anorexia and amount of meal/food tolerated;Encourage/perform oral hygiene as appropriate       Problem: Diabetes: Glucose Imbalance  Goal: Blood glucose stable at established goal  Outcome: Progressing

## 2018-02-04 NOTE — Progress Notes (Signed)
[facility]  PROGRESS NOTE      Patient: Veronica Franco  Date: 02/04/2018   LOS: 0 Days  Admission Date: 02/03/2018   MRN: [id number]  Attending: Asher Muir Kenshin Splawn  Please contact me on the following   Pager: [phone]       ASSESSMENT/PLAN     Veronica Franco is a 52 y.o. female admitted with <principal problem not specified>    Interval Summary:     Active Hospital Problems    Diagnosis   . Syncope and collapse       # Syncope and collapse, possibly vasovagal syncope  Denies any chest pain   Patient with history of PE s/p IVC filter  Will order Echo today   Will order carotid study   CXR: shows cardiomegaly with venous congestion  Telemetry monitoring    # DM2  Monitor FS   On insulin     # DVT/VTE Prophylaxis  SCD/Heparin.       Code Status: Full          SUBJECTIVE     Veronica Franco states that she's ok and denies any new complain     MEDICATIONS     Current Facility-Administered Medications   Medication Dose Route Frequency   . furosemide  80 mg Oral BID   . insulin lispro  1-3 Units Subcutaneous QHS   . insulin lispro  1-5 Units Subcutaneous TID AC   . rivaroxaban  20 mg Oral Daily with dinner       PHYSICAL EXAM     Vitals:    02/04/18 1110   BP: 115/62   Pulse: 65   Resp: 20   Temp: 97.6 F (36.4 C)   SpO2: 96%       Temperature: Temp  Min: 96.3 F (35.7 C)  Max: 98.6 F (37 C)  Pulse: Pulse  Min: 53  Max: 88  Respiratory: Resp  Min: 16  Max: 20  Non-Invasive BP: BP  Min: 106/55  Max: 123/57  Pulse Oximetry SpO2  Min: 95 %  Max: 100 %    Intake and Output Summary (Last 24 hours) at Date Time    Intake/Output Summary (Last 24 hours) at 02/04/18 1258  Last data filed at 02/04/18 0600   Gross per 24 hour   Intake              310 ml   Output              300 ml   Net               10 ml         GEN APPEARANCE: Normal;  A&OX3  HEENT: PERLA; EOMI; Conjunctiva Clear  NECK: Supple; No bruits  CVS: RRR, S1, S2; No M/G/R  LUNGS: CTAB; No Wheezes; No Rhonchi: No rales  ABD: Soft; No TTP; +  Normoactive BS  EXT: No edema; Pulses 2+ and intact  NEURO: CN 2-12 intact; No Focal neurological deficits    Exam done by Noralee Space, MD on 02/04/18 at 12:58 PM      LABS       Recent Labs  Lab 02/04/18  0439 02/03/18  1938   WBC 5.55 8.21   RBC 3.48* 3.74*   Hgb 9.2* 9.8*   Hematocrit 29.9* 31.2*   MCV 85.9 83.4   Platelets 228 257         Recent Labs  Lab 02/04/18  0439 02/03/18  1938   Sodium 144 143   Potassium 3.7 3.7   Chloride 108 107   CO2 30* 27   BUN 18.0 18.0   Creatinine 1.0 0.9   Glucose 112* 104*   Calcium 9.6 9.9         Recent Labs  Lab 02/03/18  1938   ALT 12   AST (SGOT) 12   Bilirubin, Total 0.3   Albumin 3.5   Alkaline Phosphatase 211*         Recent Labs  Lab 02/04/18  0439 02/03/18  2252 02/03/18  1938   Creatine Kinase (CK)  --   --  66   Troponin I <0.01 0.01 0.03             Microbiology Results     None           Xr Chest  Ap Portable    Result Date: 02/03/2018   No active disease is seen in the chest. Laurena Slimmer, MD 02/03/2018 7:26 PM        Echo Results     None          RADIOLOGY     Upon my review:    Veronica Franco  12:58 PM 02/04/2018

## 2018-02-04 NOTE — OT Eval Note (Signed)
Occupational Therapy Attempt Note    Patient: Veronica Franco  ZOX:[id number]    Unit: J8119/J4782.95    Patient not seen for occupational therapy secondary to being off unit for tests.  Will f/u as able.    [address] Gwendolyn Fill [place], [place] A2130  2:30 PM 02/04/2018

## 2018-02-04 NOTE — OT Eval Note (Signed)
[facility]  Occupational Therapy Evaluation and Treatment    Patient: Veronica Franco  MRN#: [id number]   Unit: [facility] 4 NORTH  Bed: A4022/A4022.01    Time of Evaluation and Treatment:  Time Calculation  OT Received On: 02/04/18  Start Time: 1521  Stop Time: 1549  Time Calculation (min): 28 min    Chart Review and Collaboration with Care Team: 6 minutes, not included in above time.    Evaluation: 14 minutes  Treatment:  14 minutes    OT Visit Number: 1    Consult received for Veronica Franco for OT Evaluation and Treatment.  Patient's medical condition is appropriate for Occupational therapy intervention at this time.    Activity Orders:  Progressive mobility protocol    Precautions and Contraindications:  Precautions  Other Precautions: falls    Medical Diagnosis:  Syncope and collapse [R55]  Chest pain in adult [R07.9]    History of Present Illness:  Veronica Franco is a 52 y.o. female admitted on 02/03/2018 with LOC post being outside, not eating or drinking.    Patient Active Problem List   Diagnosis   . Syncope and collapse        Past Medical/Surgical History:  Past Medical History:   Diagnosis Date   . Arthritis    . Congestive heart failure    . Sleep apnea      Past Surgical History:   Procedure Laterality Date   . CHOLECYSTECTOMY     . HERNIA REPAIR     . HYSTERECTOMY          X-Rays/Tests/Labs  Lab Results   Component Value Date/Time    HGB 9.2 (L) 02/04/2018 04:39 AM    HCT 29.9 (L) 02/04/2018 04:39 AM    K 3.7 02/04/2018 04:39 AM    NA 144 02/04/2018 04:39 AM    TROPI <0.01 02/04/2018 04:39 AM    TROPI 0.01 02/03/2018 10:52 PM    TROPI 0.03 02/03/2018 07:38 PM       All imaging reviewed. Please see chart for details      Social History:  Prior Level of Function  Prior level of function: Needs assistance with ADLs, Ambulates with assistive device  Assistive Device: Single point cane  Baseline Activity Level: Community ambulation  Driving: does not  drive  Dressing - Lower Body: minimal assist (W L sock & shoe)  Cooking: Yes  Employment: Disabled    Home Living Arrangements  Living Arrangements: Children, Family members  Type of Home: Apartment  Home Layout: One level, Stairs to enter with rails (add number in comment) (3 flights of stairs w/ 1 rail)  Bathroom Shower/Tub: Medical sales representative: Standard  Home Living - Notes / Comments: daughter & are home part time. Pt has HH PT for post rotator cuff surgery & arthritic knee which will be ending soon      Subjective:  Subjective: Pt feels close to her usual for mobility & ADL's   Patient is agreeable to participation in the therapy session. Nursing clears patient for therapy.   Patient Goal: return home  Pain Assessment  Pain Assessment: Numeric Scale (0-10)  Pain Score: 5-moderate pain  POSS Score: Awake and Alert  Pain Location: Head  Pain Orientation:  (all over)  Pain Descriptors: Aching  Pain Frequency: Continuous  Effect of Pain on Daily Activities: mild      Objective:      Observation of Patient/Vital Signs: VSS  Patient  received in bed with telemetry  in place.    Cognitive Status and Neuro Exam:  Cognition/Neuro Status  Safety Awareness: independent  Behavior: attentive, calm, cooperative  Coordination: GMC impaired  Hand Dominance: mixed dominance    Musculoskeletal Examination  Gross ROM  Right Upper Extremity ROM: within functional limits (except GH flex 25 degrees)  Left Upper Extremity ROM: within functional limits  Right Lower Extremity ROM: within functional limits  Left Lower Extremity ROM: within functional limits          Sensory/Oculomotor Examination            Activities of Daily Living  Self-care and Home Management  Eating: Independent  Grooming: Independent  Bathing: Supervision  UB Dressing: Independent  LB Dressing: Minimal Assist  Toileting: Independent  Functional Transfers: increased time to complete, Modified Independent (SPc)    Functional Mobility:  Mobility and  Transfers  Supine to Sit: Independent  Sit to Stand: Modified Independent, Increased Time, Increased Effort  Functional Mobility/Ambulation: Modified Independent (SPc)     Balance  Balance  Static Sitting Balance: normal  Dyanamic Sitting Balance: normal  Static Standing Balance: good  Dynamic Standing Balance: good    Participation and Activity Tolerance  Participation and Endurance  Endurance: Tolerates 30 min exercise with multiple rests  [place] Dyspnea Scale: 1+ Dyspnea    Educated the patient to role of occupational therapy, plan of care, goals of therapy and safety with mobility and ADLs.    Patient left in bed with call bell and all personal items/needs within reach. RN notified of session outcome.       Assessment:  Veronica Franco is a 52 y.o. female admitted 02/03/2018. Patient would benefit from continued skilled OT to address deficits listed below and increase functional independence.      A Expanded chart review was completed including review of labs, review of imaging and review of vitals.  There are few comorbidities or other factors that affect plan of care and require modification of task including:recent surgery, assistive device at baseline, challenging home environment .  Pt demonstrates performance deficits with bathing and functional mobility.  [name] ability to complete ADLs and functional transfers is impaired due to the following deficits:  gait and strength.     Assessment: balance deficits;decreased ROM;decreased strength;decreased endurance/activity tolerance    Complexity Chart  Review Performance  Deficits Clinical Decision  Making Hx/Co-  morbidities Assistance needed   Low Brief 1-3 Limited options None None (or at baseline)       Treatment:  ADL's: Educated Pt on function & resources for sock aide. Pt able to Don/doff L sock modified I w/ L LE placed in 1/2 tailor sit at EOb    Mobility: Pt ambulates 110 ft, including ascending/descending 12 stairs w/ 1 rail & SPC w/  supervision    Primary limiting factor: decreased ROM & str    Rehabilitation Potential: Prognosis: Good, With family    Plan:  OT Frequency Recommended: one time visit   Treatment Interventions: Functional transfer training, Endurance training, Equipment eval/education, Compensatory technique education     PMP Activity: Step 7 - Walks out of Room  Distance Walked (ft) (Step 6,7): 120 Feet (including up & down flight of stairs)    Risks/benefits/POC discussed    AM-PAC:yes  OT Daily Activity Raw Score: 22  CMS 0-100% Score: 25.80%             Goals:  Time For Goal Achievement: 1 visit  Mobility and Transfer Goals  Other Goal: Pt will ascend/descend 12 steps w/ supervision to access ADL items, VSS, goal met                             DME Recommended for Discharge: Sock aid, [facility]  Discharge Recommendation: Home with home health PT (continue [facility] PT that she was participating in)      Jon Gills [place], [place] [id number]  4:14 PM 02/04/2018

## 2018-02-04 NOTE — Plan of Care (Signed)
Problem: Safety  Goal: Patient will be free from injury during hospitalization  Outcome: Progressing   02/04/18 0016   Goal/Interventions addressed this shift   Patient will be free from injury during hospitalization  Assess patient's risk for falls and implement fall prevention plan of care per policy;Provide and maintain safe environment;Ensure appropriate safety devices are available at the bedside;Hourly rounding;Use appropriate transfer methods;Include patient/ family/ care giver in decisions related to safety;Provide alternative method of communication if needed (communication boards, writing);Assess for patients risk for elopement and implement Elopement Risk Plan per policy   Pt educated on fall precautions and safety prevention plan. Pt encouraged to use call bell for any assistance. Rounding hourly to monitor for toileting needs and comfort measures. Fall precautions in place. Bed alarm in place. High Fall Risk d/t syncope and collapse. Pt ambulates w/ cane and assistance. Limitation on Left knee d/t arthritis and R. Shoulder d/t rotator cuff sx Dec 2018. Pt on PT/OT at home. Bleeding risk band in place.     Problem: Pain  Goal: Pain at adequate level as identified by patient  Outcome: Progressing   02/04/18 0016   Goal/Interventions addressed this shift   Pain at adequate level as identified by patient Identify patient comfort function goal;Assess pain on admission, during daily assessment and/or before any "as needed" intervention(s);Reassess pain within 30-60 minutes of any procedure/intervention, per Pain Assessment, Intervention, Reassessment (AIR) Cycle;Evaluate if patient comfort function goal is met;Offer non-pharmacological pain management interventions;Evaluate patient's satisfaction with pain management progress;Include patient/patient care companion in decisions related to pain management as needed   Pt denies CP, SOB, but reports HA upon admission 10/10. Tylenol provided PRN. Tolerated and  brought to 7/10. Monitoring Pain.     Problem: Day of Admission - Heart Failure  Goal: Heart Failure Admission  Outcome: Progressing   02/04/18 0016   Goal/Interventions addressed this shift   Heart Failure Admission Standing Weight on admission, if unable to stand zero the bed and use the bed scale;Strict Intake/Output;Fluid restriction;Assess for swelling/edema and document;Vital signs and telemetry per policy;Initiate education with patient and caregiver using CHF Warning Zones and Educational Videos ([employer] or [employer])   Pt denies CP, SOB. Monitoring tele. NSR on tele w/ HR in 60-70s. First degree AV Block on EKG. Monitoring troponin. Neg for the first 2. Pt pulses +2 on radial, weak at pedal and posterior tibialis. Some edema at ankles non-pitting and pt reports some swelling at wrists/hands. Skin warm, but dry. I/Os.    Problem: Fluid and Electrolyte Imbalance/ Endocrine  Goal: Fluid and electrolyte balance are achieved/maintained  Outcome: Progressing   02/04/18 0016   Goal/Interventions addressed this shift   Fluid and electrolyte balance are achieved/maintained Monitor intake and output every shift;Monitor/assess lab values and report abnormal values;Provide adequate hydration;Assess for confusion/personality changes;Monitor daily weight;Assess and reassess fluid and electrolyte status;Observe for cardiac arrhythmias;Monitor for muscle weakness       Problem: Diabetes: Glucose Imbalance  Goal: Blood glucose stable at established goal  Outcome: Progressing   02/04/18 0016   Goal/Interventions addressed this shift   Blood glucose stable at established goal  Monitor lab values;Monitor intake and output. Notify LIP if urine output is < 30 mL/hour.;Assess for hypoglycemia /hyperglycemia;Monitor/assess vital signs;Ensure appropriate diet and assess tolerance;Ensure adequate hydration   BG 86. SS in place PRN. Cardiac diet. Accucheck. Provided graham crackers and orange juice by bedside.

## 2018-02-05 ENCOUNTER — Other Ambulatory Visit: Payer: Medicare Other

## 2018-02-05 ENCOUNTER — Observation Stay: Payer: Medicare Other

## 2018-02-05 DIAGNOSIS — I10 Essential (primary) hypertension: Secondary | ICD-10-CM

## 2018-02-05 DIAGNOSIS — E669 Obesity, unspecified: Secondary | ICD-10-CM

## 2018-02-05 DIAGNOSIS — R55 Syncope and collapse: Secondary | ICD-10-CM

## 2018-02-05 DIAGNOSIS — E119 Type 2 diabetes mellitus without complications: Secondary | ICD-10-CM

## 2018-02-05 LAB — BASIC METABOLIC PANEL
Anion Gap: 9 (ref 5.0–15.0)
BUN: 20 mg/dL — ABNORMAL HIGH (ref 7.0–19.0)
CO2: 26 mEq/L (ref 22–29)
Calcium: 9.6 mg/dL (ref 8.5–10.5)
Chloride: 107 mEq/L (ref 100–111)
Creatinine: 0.9 mg/dL (ref 0.6–1.0)
Glucose: 96 mg/dL (ref 70–100)
Potassium: 4.2 mEq/L (ref 3.5–5.1)
Sodium: 142 mEq/L (ref 136–145)

## 2018-02-05 LAB — CBC
Absolute NRBC: 0 10*3/uL (ref 0.00–0.00)
Hematocrit: 32.3 % — ABNORMAL LOW (ref 34.7–43.7)
Hgb: 9.8 g/dL — ABNORMAL LOW (ref 11.4–14.8)
MCH: 26.1 pg (ref 25.1–33.5)
MCHC: 30.3 g/dL — ABNORMAL LOW (ref 31.5–35.8)
MCV: 85.9 fL (ref 78.0–96.0)
MPV: 11.2 fL (ref 8.9–12.5)
Nucleated RBC: 0 /100 WBC (ref 0.0–0.0)
Platelets: 248 10*3/uL (ref 142–346)
RBC: 3.76 10*6/uL — ABNORMAL LOW (ref 3.90–5.10)
RDW: 15 % (ref 11–15)
WBC: 6.17 10*3/uL (ref 3.10–9.50)

## 2018-02-05 LAB — HEMOLYSIS INDEX: Hemolysis Index: 30 — ABNORMAL HIGH (ref 0–18)

## 2018-02-05 LAB — GLUCOSE WHOLE BLOOD - POCT
Whole Blood Glucose POCT: 94 mg/dL (ref 70–100)
Whole Blood Glucose POCT: 93 mg/dL (ref 70–100)
Whole Blood Glucose POCT: 129 mg/dL — ABNORMAL HIGH (ref 70–100)
Whole Blood Glucose POCT: 123 mg/dL — ABNORMAL HIGH (ref 70–100)

## 2018-02-05 LAB — GFR: EGFR: >60

## 2018-02-05 MED ORDER — GADOBUTROL 1 MMOL/ML IV SOLN
10.00 mL | Freq: Once | INTRAVENOUS | Status: AC | PRN
Start: 2018-02-05 — End: 2018-02-05
  Administered 2018-02-05: 14:00:00 10 mmol via INTRAVENOUS

## 2018-02-05 NOTE — Plan of Care (Signed)
Problem: Safety  Goal: Patient will be free from injury during hospitalization  Outcome: Progressing   02/05/18 2251   Goal/Interventions addressed this shift   Patient will be free from injury during hospitalization  Assess patient's risk for falls and implement fall prevention plan of care per policy;Provide and maintain safe environment;Use appropriate transfer methods;Ensure appropriate safety devices are available at the bedside;Include patient/ family/ care giver in decisions related to safety;Hourly rounding;Assess for patients risk for elopement and implement Elopement Risk Plan per policy;Provide alternative method of communication if needed (communication boards, writing)   Pt  re-educated on fall precautions and safety prevention plan. Pt encouraged to use call bell for any assistance. Rounding hourly to monitor for toileting needs and comfort measures. Fall precautions in place. Bed alarm in place. Pt ambulatory to restroom w/ cane.      Problem: Pain  Goal: Pain at adequate level as identified by patient  Outcome: Progressing   02/05/18 2251   Goal/Interventions addressed this shift   Pain at adequate level as identified by patient Identify patient comfort function goal;Assess pain on admission, during daily assessment and/or before any "as needed" intervention(s);Reassess pain within 30-60 minutes of any procedure/intervention, per Pain Assessment, Intervention, Reassessment (AIR) Cycle;Evaluate if patient comfort function goal is met;Evaluate patient's satisfaction with pain management progress;Offer non-pharmacological pain management interventions;Include patient/patient care companion in decisions related to pain management as needed   Pt reports HA at 5/10. Tylenol provided. Pt also reports L. Knee pain/discomfort. Provided heat pack. Monitoring HA. Pt denies CP, SOB (except w/ exertion)    Problem: Everyday - Heart Failure  Goal: Stable Vital Signs and Fluid Balance  Outcome: Progressing    02/05/18 2251   Goal/Interventions addressed this shift   Stable Vital Signs and Fluid Balance Daily Standing Weights in the morning using the same scale, after using the bathroom and before breadfast. If unable to stand, zero the bed and use the bed scale;Monitor, assess vital signs and telemetry per policy;Monitor labs and report abnormalities to physician;Strict Intake/Output;Assess for swelling/edema   VSS. Monitoring tele. HR NSR mid 60-70s, sometimes [place]. Denies CP. Monitoring UOP and I/Os. Pt reports gaining weight from a couple of days.     Pt to be NPO MN for stress test in the AM.     Problem: Diabetes: Glucose Imbalance  Goal: Blood glucose stable at established goal  Outcome: Progressing   02/05/18 2251   Goal/Interventions addressed this shift   Blood glucose stable at established goal  Monitor lab values;Assess for hypoglycemia /hyperglycemia;Monitor/assess vital signs;Coordinate medication administration with meals, as indicated;Ensure appropriate diet and assess tolerance;Ensure adequate hydration   BG stable. SS not given.

## 2018-02-05 NOTE — Progress Notes (Addendum)
Patient Veronica Franco from home with dtr and grand children.  Lives in basement apt that has three flights of stairs to the front door. Use SPC and standard walker. No hx of SNF or ARF.  HH hx via [facility]. Patient sees PCP Veronica Henson, MD @ [facility] but since PCP will be retiring in June, PCP listing for [place] given to patient. Current PCP not on file.    DCP: Home w/ HH PT.  WC van to transport. Will f/u.        02/05/18 1122   Patient Type   Within 30 Days of Previous Admission? No   Healthcare Decisions   Interviewed: Patient   Orientation/Decision Making Abilities of Patient Alert and Oriented x3, able to make decisions   Healthcare Agent Appointed Yes   Healthcare Agent's Name Veronica Franco (str-in-law)    Healthcare Agent's Phone Number [phone]   Prior to admission   Prior level of function Independent with ADLs;Ambulates with assistive device   Type of Residence Private residence   Home Layout One level  (3 flights of stairs to front door of apt)   Have running water, electricity, heat, etc? Yes   Living Arrangements Family members   How do you get to your MD appointments? self   How do you get your groceries? self   Who fixes your meals? self   Who does your laundry? self   Who picks up your prescriptions? self   Dressing Independent   Grooming Independent   Feeding Independent   Bathing Independent   Toileting Independent   DME Currently at Home Single point cane;Standard walker   Name of Prior Assisted Living Facility n/a   Home Care/Community Services Home PT/OT/Speech   Name of Agency [facility]   Prior SNF admission? (Detail) n/a   Discharge Planning   Support Systems Children;Family members   Patient expects to be discharged to: Home w/ [facility] PT   Anticipated [place] plan discussed with: Same as interviewed   Mode of transportation: Private car (family member)   Consults/Providers   PT Evaluation Needed 1   OT Evalulation Needed 1   SLP Evaluation Needed 2   Correct PCP listed in Epic? No  (comment)  (Pt seen by Dr. [place] Franco @ [facility]. MD retiring in June)   Important Message from [facility] Notice   Patient received 1st IMM Letter? Yes   Date of most recent IMM given: 02/03/18       Edmon Crape, RN, BSN  RN Case Manager  [phone]

## 2018-02-05 NOTE — Plan of Care (Signed)
Problem: Fluid and Electrolyte Imbalance/ Endocrine  Goal: Fluid and electrolyte balance are achieved/maintained  Outcome: Progressing   02/05/18 1706   Goal/Interventions addressed this shift   Fluid and electrolyte balance are achieved/maintained Monitor intake and output every shift;Provide adequate hydration;Assess for confusion/personality changes;Observe for seizure activity and initiate seizure precautions if indicated;Observe for cardiac arrhythmias       Comments: Patient electrolytes  Most of them normals,still complaints for h/a tylenol was given earlier.npo after mn for STT TOM.

## 2018-02-05 NOTE — Plan of Care (Signed)
Problem: Safety  Goal: Patient will be free from injury during hospitalization  Outcome: Progressing   02/05/18 0037   Goal/Interventions addressed this shift   Patient will be free from injury during hospitalization  Assess patient's risk for falls and implement fall prevention plan of care per policy;Provide and maintain safe environment;Use appropriate transfer methods;Ensure appropriate safety devices are available at the bedside;Include patient/ family/ care giver in decisions related to safety;Hourly rounding         Problem: Everyday - Heart Failure  Goal: Teaching-Using CHF Warning Zones and Educational Videos  Outcome: Progressing   02/05/18 0037   Goal/Interventions addressed this shift   Teaching-Using CHF Warning Zones and Educational Videos Daily Standing Weights & record;Signs & Symptoms of CHF;CHF Warning Zones and when to call for help;Medications;Sodium Restriction;Fluid Restriction if appropriate;Document in the Education Tab in EPIC with Teach-back       Problem: Diabetes: Glucose Imbalance  Goal: Blood glucose stable at established goal  Outcome: Progressing   02/05/18 0037   Goal/Interventions addressed this shift   Blood glucose stable at established goal  Monitor lab values;Monitor intake and output. Notify LIP if urine output is < 30 mL/hour.;Follow fluid restrictions/IV/PO parameters;Include patient/family in decisions related to nutrition/dietary selections;Assess for hypoglycemia /hyperglycemia;Monitor/assess vital signs;Coordinate medication administration with meals, as indicated;Ensure appropriate diet and assess tolerance;Ensure adequate hydration;Ensure appropriate consults are obtained (Nutrition, Diabetes Education, and Case Management);Ensure patient/family has adequate teaching materials     Comments:  learning and cognitive abilities were assessed and determined to be appropriate for understanding of her plan of care during this shift.  individualized plan of care for this shift  include: safety, monitor blood sugar, monitor telemetry & VS, medication administration per orders. The plan of care was discussed with pt during bed side report at the beginning of the shift and she agreed to the plan. Patient demonstrated understanding of disease process or health condition, treatment plan, medications and consequences of noncompliance. All questions were answered and addressed.     Patient is A/O x4, denies any pain or SOB, remained on room air with oxygen saturation of 96% , Temp:97.4, RR:18, HR:61,  BP: 123/56. Will continue to monitor per plan of care.

## 2018-02-05 NOTE — Progress Notes (Signed)
CONSULTATION    [facility]    Date Time: 02/05/18 12:51 PM  Patient Name: Veronica Franco  Requesting Physician: Noralee Space, MD      Reason for Consultation:   Syncope      Assessment:     Syncope  Chest pain  DM  HTN  Hx PE  Obesity    Plan:     Check old records - apparently had a cardiac cath 1 year ago @ [facility]  Continue telemetry  Stress test in am ( pending old records)  Will need outpatient event monitor         History:   Veronica Franco is a 52 y.o. female who presents to the hospital on 02/03/2018 with history of  diabetes and hypertension, PE complaining of  syncope     Syncope: Patient complains of syncope. Onset was 1 day ago, with resolved course since that time. Patient describes the episode as syncopal episode occurred at rest. Patient also has associated symptoms of  general feeling of lightheadedness and chest pain, dyspnea. The patient denies abdominal pain, diarrhea and melena.     Chest pain - left sternal, pressure, no radiation, occurred 2 minutes before syncope  Dyspnea - occurred before syncope    No palpitations  No LE edema  Chronic orthopnea  Daily lasix        Past Medical History:     Hx PE  Hx of CHF ??  Per pt  Hx of OSA    Past Surgical History:     Past Surgical History:   Procedure Laterality Date   . CHOLECYSTECTOMY     . HERNIA REPAIR     . HYSTERECTOMY         Family History:   History reviewed. No pertinent family history.    Social History:     Social History     Social History   . Marital status: Divorced     Spouse name: N/A   . Number of children: N/A   . Years of education: N/A     Social History Main Topics   . Smoking status: Never Smoker   . Smokeless tobacco: Never Used   . Alcohol use No   . Drug use: No   . Sexual activity: Not on file     Other Topics Concern   . Not on file     Social History Narrative   . No narrative on file       Allergies:     Allergies   Allergen Reactions   . Codeine Hives       Medications:     Current  Facility-Administered Medications   Medication Dose Route Frequency   . furosemide  80 mg Oral BID   . insulin lispro  1-3 Units Subcutaneous QHS   . insulin lispro  1-5 Units Subcutaneous TID AC   . rivaroxaban  20 mg Oral Daily with dinner         Review of Systems:       As per hpi otherwise negative       Physical Exam:     Vitals:    02/05/18 1147   BP: 128/67   Pulse: 64   Resp: 18   Temp: 97.6 F (36.4 C)   SpO2: 98%        Intake and Output Summary (Last 24 hours) at Date Time    Intake/Output Summary (Last 24 hours) at 02/05/18 1251  Last data  filed at 02/05/18 1147   Gross per 24 hour   Intake              800 ml   Output              200 ml   Net              600 ml       General appearance - alert, well appearing, and in no distress  Mental status - alert, oriented to person, place, and time  Chest - clear to auscultation,   Heart - normal rate, regular rhythm,   Abdomen - soft, nontender, nondistended,   Extremities - no LE edema  Skin - warm to touch  Neuro - nonfocal  Skin warm to touch  msk - no atrophy      Labs Reviewed:     Results     Procedure Component Value Units Date/Time    Glucose Whole Blood - POCT [[id number]] Collected:  02/05/18 1148     Updated:  02/05/18 1209     POCT - Glucose Whole blood 93 mg/dL     Glucose Whole Blood - POCT [[id number]] Collected:  02/05/18 0745     Updated:  02/05/18 0800     POCT - Glucose Whole blood 94 mg/dL     Basic Metabolic Panel [[id number]]  (Abnormal) Collected:  02/05/18 0357    Specimen:  Blood Updated:  02/05/18 0600     Glucose 96 mg/dL      BUN 08.6 (H) mg/dL      Creatinine 0.9 mg/dL      Calcium 9.6 mg/dL      Sodium 578 mEq/L      Potassium 4.2 mEq/L      Chloride 107 mEq/L      CO2 26 mEq/L      Anion Gap 9.0    Hemolysis index [[id number]]  (Abnormal) Collected:  02/05/18 0357     Updated:  02/05/18 0600     Hemolysis Index 30 (H)    GFR [[id number]] Collected:  02/05/18 0357     Updated:  02/05/18 0600     EGFR >60.0    CBC [[id number]]   (Abnormal) Collected:  02/05/18 0357    Specimen:  Blood from Blood Updated:  02/05/18 0537     WBC 6.17 x10 3/uL      Hgb 9.8 (L) g/dL      Hematocrit 03.4 (L) %      Platelets 248 x10 3/uL      RBC 3.76 (L) x10 6/uL      MCV 85.9 fL      MCH 26.1 pg      MCHC 30.3 (L) g/dL      RDW 15 %      MPV 11.2 fL      Nucleated RBC 0.0 /100 WBC      Absolute NRBC 0.00 x10 3/uL     Hemoglobin A1C [[id number]] Collected:  02/04/18 0439    Specimen:  Blood Updated:  02/04/18 2151     Hemoglobin A1C 5.7 %      Average Estimated Glucose 116.9 mg/dL     Glucose Whole Blood - POCT [[id number]]  (Abnormal) Collected:  02/04/18 2123     Updated:  02/04/18 2128     POCT - Glucose Whole blood 105 (H) mg/dL     Glucose Whole Blood - POCT [[id number]]  (Abnormal) Collected:  02/04/18 1507     Updated:  02/04/18  1532     POCT - Glucose Whole blood 114 (H) mg/dL         ekg - independently reviewed by myself - nsr, 1 avb  Echo - normal LV function; no significant valvular heart disease    Signed by: Darnelle Maffucci

## 2018-02-05 NOTE — UM Notes (Signed)
Primary Payor: MEDICARE/MEDICARE PART A AND B   Secondary Payor: MEDICAID/[place]ICAID [place]  CSR: 02/05/18    [name] Shirelle    BP 128/67   Pulse 64   Temp 97.6 F (36.4 C) (Oral)   Resp 18   Ht 1.651 m (5\' 5" )   Wt 144.9 kg (319 lb 8 oz)   SpO2 98%   BMI 53.17 kg/m     Temp:  [96.6 F (35.9 C)-97.6 F (36.4 C)]   Heart Rate:  [61-66]   Resp Rate:  [18-20]   BP: (104-131)/(56-67)   SpO2:  [96 %-99 %]   Weight:  [144.9 kg (319 lb 8 oz)]     Last recorded pain score:  Pain Scale Used: Numeric Scale (0-10)  Pain Score: 10-severe pain   O2: 96% RA     Lab Results last 48 Hours     Procedure Component Value Units Date/Time    Glucose Whole Blood - POCT [[id number]] Collected:  02/05/18 1148     Updated:  02/05/18 1209     POCT - Glucose Whole blood 93 mg/dL     Glucose Whole Blood - POCT [[id number]] Collected:  02/05/18 0745     Updated:  02/05/18 0800     POCT - Glucose Whole blood 94 mg/dL     Basic Metabolic Panel [[id number]]  (Abnormal) Collected:  02/05/18 0357    Specimen:  Blood Updated:  02/05/18 0600     Glucose 96 mg/dL      BUN 08.6 (H) mg/dL      Creatinine 0.9 mg/dL      Calcium 9.6 mg/dL      Sodium 578 mEq/L      Potassium 4.2 mEq/L      Chloride 107 mEq/L      CO2 26 mEq/L      Anion Gap 9.0    Hemolysis index [[id number]]  (Abnormal) Collected:  02/05/18 0357     Updated:  02/05/18 0600     Hemolysis Index 30 (H)    GFR [[id number]] Collected:  02/05/18 0357     Updated:  02/05/18 0600     EGFR >60.0    CBC [[id number]]  (Abnormal) Collected:  02/05/18 0357    Specimen:  Blood from Blood Updated:  02/05/18 0537     WBC 6.17 x10 3/uL      Hgb 9.8 (L) g/dL      Hematocrit 03.4 (L) %      Platelets 248 x10 3/uL      RBC 3.76 (L) x10 6/uL      MCV 85.9 fL      MCH 26.1 pg      MCHC 30.3 (L) g/dL      RDW 15 %      MPV 11.2 fL      Nucleated RBC 0.0 /100 WBC      Absolute NRBC 0.00 x10 3/uL     Hemoglobin A1C [[id number]] Collected:  02/04/18 0439    Specimen:  Blood Updated:  02/04/18 2151      Hemoglobin A1C 5.7 %      Average Estimated Glucose 116.9 mg/dL     Glucose Whole Blood - POCT [[id number]]  (Abnormal) Collected:  02/04/18 2123     Updated:  02/04/18 2128     POCT - Glucose Whole blood 105 (H) mg/dL     Glucose Whole Blood - POCT [[id number]]  (Abnormal) Collected:  02/04/18 1507     Updated:  02/04/18 1532  POCT - Glucose Whole blood 114 (H) mg/dL     Glucose Whole Blood - POCT [[id number]] Collected:  02/04/18 1158     Updated:  02/04/18 1202     POCT - Glucose Whole blood 93 mg/dL     Troponin I [[id number]] Collected:  02/04/18 0439    Specimen:  Blood Updated:  02/04/18 0545     Troponin I <0.01 ng/mL     Basic Metabolic Panel [[id number]]  (Abnormal) Collected:  02/04/18 0439    Specimen:  Blood Updated:  02/04/18 0538     Glucose 112 (H) mg/dL      BUN 08.6 mg/dL      Creatinine 1.0 mg/dL      Calcium 9.6 mg/dL      Sodium 578 mEq/L      Potassium 3.7 mEq/L      Chloride 108 mEq/L      CO2 30 (H) mEq/L      Anion Gap 6.0    Hemolysis index [[id number]] Collected:  02/04/18 0439     Updated:  02/04/18 0538     Hemolysis Index 5    GFR [[id number]] Collected:  02/04/18 0439     Updated:  02/04/18 0538     EGFR >60.0    CBC [[id number]]  (Abnormal) Collected:  02/04/18 0439    Specimen:  Blood from Blood Updated:  02/04/18 0525     WBC 5.55 x10 3/uL      Hgb 9.2 (L) g/dL      Hematocrit 03.4 (L) %      Platelets 228 x10 3/uL      RBC 3.48 (L) x10 6/uL      MCV 85.9 fL      MCH 26.4 pg      MCHC 30.8 (L) g/dL      RDW 15 %      MPV 10.7 fL      Nucleated RBC 0.0 /100 WBC      Absolute NRBC 0.00 x10 3/uL     Troponin I [[id number]] Collected:  02/03/18 2252    Specimen:  Blood Updated:  02/03/18 2318     Troponin I 0.01 ng/mL     Glucose Whole Blood - POCT [[id number]] Collected:  02/03/18 2243     Updated:  02/03/18 2254     POCT - Glucose Whole blood 86 mg/dL     UA, Reflex to Microscopic (pts 3 + yrs) [[id number]] Collected:  02/03/18 2038    Specimen:  Urine Updated:  02/03/18 2110     Urine Type  Clean Catch     Color, UA Yellow     Clarity, UA Hazy     Specific Gravity UA 1.011     Urine pH 6.0     Leukocyte Esterase, UA Negative     Nitrite, UA Negative     Protein, UR Negative     Glucose, UA Negative     Ketones UA Negative     Urobilinogen, UA Negative mg/dL      Bilirubin, UA Negative     Blood, UA Negative    CREATINE KINASE LEVEL (CK) [[id number]] Collected:  02/03/18 1938    Specimen:  Blood Updated:  02/03/18 2033     Creatine Kinase (CK) 66 U/L     Hemolysis index [[id number]] Collected:  02/03/18 1938     Updated:  02/03/18 2033     Hemolysis Index 1    Troponin I [[id number]] Collected:  02/03/18 1938  Specimen:  Blood Updated:  02/03/18 2020     Troponin I 0.03 ng/mL     Comprehensive metabolic panel [[id number]]  (Abnormal) Collected:  02/03/18 1938    Specimen:  Blood Updated:  02/03/18 2014     Glucose 104 (H) mg/dL      BUN 40.9 mg/dL      Creatinine 0.9 mg/dL      Sodium 811 mEq/L      Potassium 3.7 mEq/L      Chloride 107 mEq/L      CO2 27 mEq/L      Calcium 9.9 mg/dL      Protein, Total 7.3 g/dL      Albumin 3.5 g/dL      AST (SGOT) 12 U/L      ALT 12 U/L      Alkaline Phosphatase 211 (H) U/L      Bilirubin, Total 0.3 mg/dL      Globulin 3.8 (H) g/dL      Albumin/Globulin Ratio 0.9     Anion Gap 9.0    Lipase [[id number]] Collected:  02/03/18 1938    Specimen:  Blood Updated:  02/03/18 2014     Lipase 45 U/L     Hemolysis index [[id number]] Collected:  02/03/18 1938     Updated:  02/03/18 2014     Hemolysis Index 0    GFR [[id number]] Collected:  02/03/18 1938     Updated:  02/03/18 2014     EGFR >60.0    D-Dimer [[id number]] Collected:  02/03/18 1938     Updated:  02/03/18 2005     D-Dimer 0.36 ug/mL FEU     CBC with differential [[id number]]  (Abnormal) Collected:  02/03/18 1938    Specimen:  Blood from Blood Updated:  02/03/18 1956     WBC 8.21 x10 3/uL      Hgb 9.8 (L) g/dL      Hematocrit 03.4 (L) %      Platelets 257 x10 3/uL      RBC 3.74 (L) x10 6/uL      MCV 83.4 fL      MCH 26.2 pg       MCHC 31.4 (L) g/dL      RDW 15 %      MPV 10.4 fL      Neutrophils 61.9 %      Lymphocytes Automated 28.5 %      Monocytes 7.7 %      Eosinophils Automated 1.3 %      Basophils Automated 0.4 %      Immature Granulocyte 0.2 %      Nucleated RBC 0.0 /100 WBC      Neutrophils Absolute 5.08 x10 3/uL      Abs Lymph Automated 2.34 x10 3/uL      Abs Mono Automated 0.63 x10 3/uL      Abs Eos Automated 0.11 x10 3/uL      Absolute Baso Automated 0.03 x10 3/uL      Absolute Immature Granulocyte 0.02 x10 3/uL      Absolute NRBC 0.00 x10 3/uL         MD Note 02/05/18  # Syncope and collapse, possibly vasovagal syncope  Denies any chest pain   Patient with history of PE s/p IVC filter  ECHO Completed   Will order carotid study    DM2  Monitor FS   On insulin   # DVT/VTE Prophylaxis  SCD/Heparin    Ky Barban  MS Health Informatics/Healthcare  Administration, Cert. Careers information officer, Chief Financial Officer, BS Biology   Utilization Review Case Manager  Case Management [facility]  [address]  [place], [place]  T [phone] Jerilynn Mages [phone]  [email or web address]  Please submit all clinical review request via fax to [phone]

## 2018-02-05 NOTE — Progress Notes (Signed)
[facility]  PROGRESS NOTE      Patient: Veronica Franco  Date: 02/05/2018   LOS: 0 Days  Admission Date: 02/03/2018   MRN: [id number]  Attending: Asher Muir Chelcie Estorga  Please contact me on the following   Pager: [phone]       ASSESSMENT/PLAN     Tanita Terianna Peggs is a 52 y.o. female admitted with <principal problem not specified>    Interval Summary:     Active Hospital Problems    Diagnosis   . Syncope and collapse       # Syncope and collapse, possibly vasovagal syncope  MRI ordered today   Cardiology was consulted, will follow   Denies any chest pain   Patient with history of PE s/p IVC filter   carotid study reviewed   CXR: shows cardiomegaly with venous congestion  Telemetry monitoring    # DM2  Monitor FS   On insulin     # DVT/VTE Prophylaxis  SCD/Heparin.       Code Status: Full          SUBJECTIVE     Elner Dallas Schimke states that she's ok and denies any new complain     MEDICATIONS     Current Facility-Administered Medications   Medication Dose Route Frequency   . furosemide  80 mg Oral BID   . insulin lispro  1-3 Units Subcutaneous QHS   . insulin lispro  1-5 Units Subcutaneous TID AC   . rivaroxaban  20 mg Oral Daily with dinner       PHYSICAL EXAM     Vitals:    02/05/18 1147   BP: 128/67   Pulse: 64   Resp: 18   Temp: 97.6 F (36.4 C)   SpO2: 98%       Temperature: Temp  Min: 96.6 F (35.9 C)  Max: 97.6 F (36.4 C)  Pulse: Pulse  Min: 61  Max: 66  Respiratory: Resp  Min: 18  Max: 20  Non-Invasive BP: BP  Min: 104/59  Max: 131/62  Pulse Oximetry SpO2  Min: 96 %  Max: 99 %    Intake and Output Summary (Last 24 hours) at Date Time    Intake/Output Summary (Last 24 hours) at 02/05/18 1259  Last data filed at 02/05/18 1147   Gross per 24 hour   Intake              800 ml   Output              200 ml   Net              600 ml         GEN APPEARANCE: Normal;  A&OX3  HEENT: PERLA; EOMI; Conjunctiva Clear  NECK: Supple; No bruits  CVS: RRR, S1, S2; No M/G/R  LUNGS: CTAB; No Wheezes; No Rhonchi: No  rales  ABD: Soft; No TTP; + Normoactive BS  EXT: No edema; Pulses 2+ and intact  NEURO: CN 2-12 intact; No Focal neurological deficits    Exam done by Noralee Space, MD on 02/04/18 at 12:58 PM      LABS       Recent Labs  Lab 02/05/18  0357 02/04/18  0439 02/03/18  1938   WBC 6.17 5.55 8.21   RBC 3.76* 3.48* 3.74*   Hgb 9.8* 9.2* 9.8*   Hematocrit 32.3* 29.9* 31.2*   MCV 85.9 85.9 83.4   Platelets 248 228 257  Recent Labs  Lab 02/05/18  0357 02/04/18  0439 02/03/18  1938   Sodium 142 144 143   Potassium 4.2 3.7 3.7   Chloride 107 108 107   CO2 26 30* 27   BUN 20.0* 18.0 18.0   Creatinine 0.9 1.0 0.9   Glucose 96 112* 104*   Calcium 9.6 9.6 9.9         Recent Labs  Lab 02/03/18  1938   ALT 12   AST (SGOT) 12   Bilirubin, Total 0.3   Albumin 3.5   Alkaline Phosphatase 211*         Recent Labs  Lab 02/04/18  0439 02/03/18  2252 02/03/18  1938   Creatine Kinase (CK)  --   --  66   Troponin I <0.01 0.01 0.03             Microbiology Results     None           Xr Chest  Ap Portable    Result Date: 02/03/2018   No active disease is seen in the chest. Laurena Slimmer, MD 02/03/2018 7:26 PM    US Carotid Duplex Dopp Comp Bilateral    Result Date: 02/04/2018  1. Normal evaluation of the cervical carotid arteries and vertebral arteries. No evidence of significant stenotic disease. Georgiana Spinner, MD 02/04/2018 3:04 PM        Echo Results     None          RADIOLOGY     Upon my review:    Cephus Slater Lynnwood Beckford  12:59 PM 02/05/2018

## 2018-02-06 ENCOUNTER — Observation Stay (HOSPITAL_BASED_OUTPATIENT_CLINIC_OR_DEPARTMENT_OTHER): Payer: Medicare Other

## 2018-02-06 DIAGNOSIS — R079 Chest pain, unspecified: Secondary | ICD-10-CM

## 2018-02-06 LAB — BASIC METABOLIC PANEL
Anion Gap: 9 (ref 5.0–15.0)
BUN: 18 mg/dL (ref 7.0–19.0)
CO2: 26 mEq/L (ref 22–29)
Calcium: 9.8 mg/dL (ref 8.5–10.5)
Chloride: 106 mEq/L (ref 100–111)
Creatinine: 0.8 mg/dL (ref 0.6–1.0)
Glucose: 101 mg/dL — ABNORMAL HIGH (ref 70–100)
Potassium: 4.6 mEq/L (ref 3.5–5.1)
Sodium: 141 mEq/L (ref 136–145)

## 2018-02-06 LAB — ECG 12-LEAD
Atrial Rate: 77 {beats}/min
Atrial Rate: 65 {beats}/min
P Axis: 53 degrees
P Axis: 49 degrees
P-R Interval: 216 ms
P-R Interval: 216 ms
Q-T Interval: 374 ms
Q-T Interval: 408 ms
QRS Duration: 88 ms
QRS Duration: 86 ms
QTC Calculation (Bezet): 423 ms
QTC Calculation (Bezet): 424 ms
R Axis: 42 degrees
R Axis: 46 degrees
T Axis: 35 degrees
T Axis: 39 degrees
Ventricular Rate: 77 {beats}/min
Ventricular Rate: 65 {beats}/min

## 2018-02-06 LAB — CBC
Absolute NRBC: 0 10*3/uL (ref 0.00–0.00)
Hematocrit: 31.9 % — ABNORMAL LOW (ref 34.7–43.7)
Hgb: 9.8 g/dL — ABNORMAL LOW (ref 11.4–14.8)
MCH: 26 pg (ref 25.1–33.5)
MCHC: 30.7 g/dL — ABNORMAL LOW (ref 31.5–35.8)
MCV: 84.6 fL (ref 78.0–96.0)
MPV: 10.4 fL (ref 8.9–12.5)
Nucleated RBC: 0 /100 WBC (ref 0.0–0.0)
Platelets: 225 10*3/uL (ref 142–346)
RBC: 3.77 10*6/uL — ABNORMAL LOW (ref 3.90–5.10)
RDW: 15 % (ref 11–15)
WBC: 5.61 10*3/uL (ref 3.10–9.50)

## 2018-02-06 LAB — GLUCOSE WHOLE BLOOD - POCT
Whole Blood Glucose POCT: 91 mg/dL (ref 70–100)
Whole Blood Glucose POCT: 85 mg/dL (ref 70–100)
Whole Blood Glucose POCT: 81 mg/dL (ref 70–100)
Whole Blood Glucose POCT: 132 mg/dL — ABNORMAL HIGH (ref 70–100)

## 2018-02-06 LAB — HEMOLYSIS INDEX: Hemolysis Index: 17 (ref 0–18)

## 2018-02-06 LAB — GFR: EGFR: >60

## 2018-02-06 MED ORDER — REGADENOSON 0.4 MG/5ML IV SOLN
INTRAVENOUS | Status: AC
Start: 2018-02-06 — End: 2018-02-06
  Administered 2018-02-06: 11:00:00 0.4 mg via INTRAVENOUS
  Filled 2018-02-06: qty 5

## 2018-02-06 NOTE — Plan of Care (Signed)
Problem: Inadequate Tissue Perfusion  Goal: Adequate tissue perfusion will be maintained   02/06/18 0900   Goal/Interventions addressed this shift   Adequate tissue perfusion will be maintained Monitor/assess lab values and report abnormal values;Monitor/assess vital signs;Monitor/assess neurovascular status (pulses, capillary refill, pain, paresthesia, paralysis, presence of edema);Monitor intake and output;Monitor/assess for signs of VTE (edema of calf/thigh redness, pain);Monitor for signs and symptoms of a pulmonary embolism (dyspnea, tachypnea, tachycardia, confusion);VTE Prevention: Administer anticoagulant(s) and/or apply anti-embolism stockings/devices as ordered;Encourage/assist patient as needed to turn, cough, and perform deep breathing every 2 hours;Reinforce use of ordered respiratory interventions (i.e. CPAP, BiPAP, Incentive Spirometer, Acapella, etc.);Perform active/passive ROM;Increase mobility as tolerated/progressive mobility;Position patient for maximum circulation/cardiac output;Place shoes or other foot protection on patient;Assess and monitor skin integrity       Comments: Denies cp or sob ,tele on SR .npo for STT today, labs within normal limits.CPAP IN use hs.,been ambulating in the room.VSS.

## 2018-02-06 NOTE — Progress Notes (Signed)
Patient came back STT ,RESUMED DIET AND AM MEDS GIVEN.RESULT OF STT PENDING,DENIES CP,TELE SR HR 63.

## 2018-02-06 NOTE — UM Notes (Addendum)
Primary Payor: MEDICARE/MEDICARE PART A AND B   Secondary Payor: MEDICAID/[place]ID [place]  CSR: 02/06/18    Veronica Franco    BP 115/66   Pulse 68   Temp 97.5 F (36.4 C)   Resp 20   Ht 1.651 m (5\' 5" )   Wt 145.8 kg (321 lb 6.9 oz)   SpO2 98%   BMI 53.49 kg/m     Temp:  [95.5 F (35.3 C)-97.8 F (36.6 C)]   Heart Rate:  [59-68]   Resp Rate:  [18-20]   BP: (115-127)/(60-79)   SpO2:  [98 %-100 %]   Weight:  [145.8 kg (321 lb 6.9 oz)-147.3 kg (324 lb 11.2 oz)]     Last recorded pain score:  Pain Scale Used: Numeric Scale (0-10)  Pain Score: 0-No pain   O2: 98% RA     Lab Results last 48 Hours     Procedure Component Value Units Date/Time    Glucose Whole Blood - POCT [[id number]] Collected:  02/06/18 1229     Updated:  02/06/18 1232     POCT - Glucose Whole blood 85 mg/dL     GFR [[id number]] Collected:  02/06/18 0831     Updated:  02/06/18 0919     EGFR >60.0    Basic Metabolic Panel [[id number]]  (Abnormal) Collected:  02/06/18 0831    Specimen:  Blood Updated:  02/06/18 0919     Glucose 101 (H) mg/dL      BUN 18.8 mg/dL      Creatinine 0.8 mg/dL      Calcium 9.8 mg/dL      Sodium 416 mEq/L      Potassium 4.6 mEq/L      Chloride 106 mEq/L      CO2 26 mEq/L      Anion Gap 9.0    Hemolysis index [[id number]] Collected:  02/06/18 0831     Updated:  02/06/18 0919     Hemolysis Index 17    CBC [[id number]]  (Abnormal) Collected:  02/06/18 0831    Specimen:  Blood from Blood Updated:  02/06/18 0904     WBC 5.61 x10 3/uL      Hgb 9.8 (L) g/dL      Hematocrit 22.0 (L) %      Platelets 225 x10 3/uL      RBC 3.77 (L) x10 6/uL      MCV 84.6 fL      MCH 26.0 pg      MCHC 30.7 (L) g/dL      RDW 15 %      MPV 10.4 fL      Nucleated RBC 0.0 /100 WBC      Absolute NRBC 0.00 x10 3/uL     Glucose Whole Blood - POCT [[id number]] Collected:  02/06/18 0702     Updated:  02/06/18 0724     POCT - Glucose Whole blood 91 mg/dL     Glucose Whole Blood - POCT [[id number]]  (Abnormal) Collected:  02/05/18 2047     Updated:  02/05/18  2102     POCT - Glucose Whole blood 123 (H) mg/dL     Glucose Whole Blood - POCT [[id number]]  (Abnormal) Collected:  02/05/18 1606     Updated:  02/05/18 1630     POCT - Glucose Whole blood 129 (H) mg/dL     Glucose Whole Blood - POCT [[id number]] Collected:  02/05/18 1148     Updated:  02/05/18 1209     POCT -  Glucose Whole blood 93 mg/dL     Glucose Whole Blood - POCT [[id number]] Collected:  02/05/18 0745     Updated:  02/05/18 0800     POCT - Glucose Whole blood 94 mg/dL     Basic Metabolic Panel [[id number]]  (Abnormal) Collected:  02/05/18 0357    Specimen:  Blood Updated:  02/05/18 0600     Glucose 96 mg/dL      BUN 32.4 (H) mg/dL      Creatinine 0.9 mg/dL      Calcium 9.6 mg/dL      Sodium 401 mEq/L      Potassium 4.2 mEq/L      Chloride 107 mEq/L      CO2 26 mEq/L      Anion Gap 9.0    Hemolysis index [[id number]]  (Abnormal) Collected:  02/05/18 0357     Updated:  02/05/18 0600     Hemolysis Index 30 (H)    GFR [[id number]] Collected:  02/05/18 0357     Updated:  02/05/18 0600     EGFR >60.0    CBC [[id number]]  (Abnormal) Collected:  02/05/18 0357    Specimen:  Blood from Blood Updated:  02/05/18 0537     WBC 6.17 x10 3/uL      Hgb 9.8 (L) g/dL      Hematocrit 32.9 (L) %      Platelets 248 x10 3/uL      RBC 3.76 (L) x10 6/uL      MCV 85.9 fL      MCH 26.1 pg      MCHC 30.3 (L) g/dL      RDW 15 %      MPV 11.2 fL      Nucleated RBC 0.0 /100 WBC      Absolute NRBC 0.00 x10 3/uL     Hemoglobin A1C [[id number]] Collected:  02/04/18 0439    Specimen:  Blood Updated:  02/04/18 2151     Hemoglobin A1C 5.7 %      Average Estimated Glucose 116.9 mg/dL     Glucose Whole Blood - POCT [[id number]]  (Abnormal) Collected:  02/04/18 2123     Updated:  02/04/18 2128     POCT - Glucose Whole blood 105 (H) mg/dL     Glucose Whole Blood - POCT [[id number]]  (Abnormal) Collected:  02/04/18 1507     Updated:  02/04/18 1532     POCT - Glucose Whole blood 114 (H) mg/dL         Cardiology MD Note 02/06/18  Assessment:  Syncope  Chest  pain  DM  HTN  Hx PE  Obesity  Plan:  Will await results of stress images and if normal no further Cv w/u needed.     RN Note 02/06/18  Denies cp or sob ,tele on SR .npo for STT today, labs within normal limits.CPAP IN use hs.,been ambulating in the room.VSS

## 2018-02-06 NOTE — Progress Notes (Signed)
PATIENT NPO  Tom. For the second part of the STT TOM., NPO AFTER MN.

## 2018-02-06 NOTE — Progress Notes (Signed)
[facility]  Progress Note      Date Time: 02/06/18 10:54 AM  Patient Name: Veronica Franco, Veronica Franco     Assessment:     Syncope  Chest pain  DM  HTN  Hx PE  Obesity    Plan:   Will await results of stress images and if normal no further Cv w/u needed.       Signed by: Fortino Sic, MD  [facility])  [phone]  MD LINE [phone]    Patient Active Problem List   Diagnosis   . Syncope and collapse       Subjective:   Denies chest pain, SOB or palpitations.    Medications:     Current Facility-Administered Medications   Medication Dose Route Frequency Provider Last Rate Last Dose   . acetaminophen (TYLENOL) tablet 650 mg  650 mg Oral Q4H PRN Marya Amsler, MD   650 mg at 02/05/18 2023    Or   . acetaminophen (TYLENOL) suppository 650 mg  650 mg Rectal Q4H PRN Marya Amsler, MD       . dextrose (GLUCOSE) 40 % oral gel 15 g of glucose  15 g of glucose Oral PRN Marya Amsler, MD        And   . dextrose 50 % bolus 12.5 g  12.5 g Intravenous PRN Marya Amsler, MD        And   . glucagon (rDNA) (GLUCAGEN) injection 1 mg  1 mg Intramuscular PRN Marya Amsler, MD       . furosemide (LASIX) tablet 80 mg  80 mg Oral BID Marya Amsler, MD   80 mg at 02/05/18 1922   . insulin lispro (HumaLOG) injection 1-3 Units  1-3 Units Subcutaneous QHS Marya Amsler, MD       . insulin lispro (HumaLOG) injection 1-5 Units  1-5 Units Subcutaneous TID AC Jabbar, Fredirick Lathe, MD       . naloxone Jonelle Sports) injection 0.2 mg  0.2 mg Intravenous PRN Marya Amsler, MD       . ondansetron (ZOFRAN-ODT) disintegrating tablet 4 mg  4 mg Oral Q6H PRN Marya Amsler, MD        Or   . ondansetron (ZOFRAN) injection 4 mg  4 mg Intravenous Q6H PRN Marya Amsler, MD       . regadenoson (LEXISCAN) 0.4 MG/5ML injection            . rivaroxaban (XARELTO) tablet 20 mg  20 mg Oral Daily with dinner Noralee Space, MD   20 mg at 02/05/18 1924       Physical Exam:     Vitals:    02/06/18 0700   BP: 127/79   Pulse: 63   Resp: 18   Temp: (!) 95.5 F  (35.3 C)   SpO2: 99%     Temp (24hrs), Avg:97 F (36.1 C), Min:95.5 F (35.3 C), Max:97.8 F (36.6 C)      Telemetry reviewed no changes.     Intake and Output Summary (Last 24 hours) at Date Time    Intake/Output Summary (Last 24 hours) at 02/06/18 1054  Last data filed at 02/06/18 0913   Gross per 24 hour   Intake              790 ml   Output             2350 ml   Net            -1560 ml  General Appearance:  Breathing comfortable, no acute distress  Head:  normocephalic  Eyes:  EOM's intact  Neck:  No carotid bruit or jugular venous distension, brisk carotid upstroke  Lungs:  Clear to auscultation throughout, no wheezes, rhonchi or rales, good respiratory effort   Chest Wall:  No tenderness or deformity  Heart:  S1, S2 normal, no S3, no S4, no murmur, PMI not displaced, no rub   Abdomen:  Soft, non-tender, positive bowel sounds, no hepatojugular reflux  Extremities:  No cyanosis, clubbing or edema  Pulses:  Equal radial pulses, 4/4 symmetric  Neurologic:  Alert and oriented x3, mood and affect normal    Labs:   Recent CMP   Recent Labs      02/06/18   0831   Glucose  101*   BUN  18.0   Creatinine  0.8   Sodium  141   CO2  26   Calcium  9.8     Recent CARDIAC ENZYMES No results for input(s): TROPONIN, ISTATTROPONI, CK in the last 24 hours.    Invalid input(s): TROPONINT, CKMB[24  Recent TSH Invalid input(s): TSH[24  Recent PT/PTT No results for input(s): INR, PTT in the last 24 hours.    Invalid input(s): PTI, COUM, COUMP, ACOAG, ACOAP[24  Recent CBC WITH DIFF Recent Labs      02/06/18   0831   RBC  3.77*   Hgb  9.8*   Hematocrit  31.9*   MCV  84.6   MCHC  30.7*   RDW  15   MPV  10.4   Platelets  225     Recent LIPID PANEL No results for input(s): CHOL, TRIG, HDL in the last 24 hours.    Invalid input(s): LDLC, VLDLC, LRAT[24  Recent ABG No results for input(s): TEMP, FIO2, RATE, MODE, ETCO2, PEEP in the last 24 hours.    Invalid input(s): APH, APCO2, APO2, AHCO3, ATCO2, ABE, AOSAT, ABGS, ALLEN, STATS,  O2DEL, O2FLO, PRESS, VNTMN, PRSUP, TIVOL[24      Rads:   Radiological Procedure reviewed.    ECG- nsr, wnl

## 2018-02-06 NOTE — Progress Notes (Signed)
[facility]  PROGRESS NOTE      Patient: Veronica Franco  Date: 02/06/2018   LOS: 0 Days  Admission Date: 02/03/2018   MRN: [id number]  Attending: Asher Muir Jakeel Starliper  Please contact me on the following   Pager: [phone]       ASSESSMENT/PLAN     Veronica Franco is a 52 y.o. female admitted with <principal problem not specified>    Interval Summary:     Active Hospital Problems    Diagnosis   . Syncope and collapse     # Syncope and collapse, possibly vasovagal syncope  Patient will have stress test today   MRI was within normal limits.  Denies any chest pain   Patient with history of PE s/p IVC filter   carotid study reviewed   CXR: shows cardiomegaly with venous congestion  Telemetry monitoring  Cardiology follow the patient     # DM2  Monitor FS   On insulin     # DVT/VTE Prophylaxis  SCD/Heparin.       Code Status: Full          SUBJECTIVE     Veronica Franco states that she's ok and denies any new complain     MEDICATIONS     Current Facility-Administered Medications   Medication Dose Route Frequency   . furosemide  80 mg Oral BID   . insulin lispro  1-3 Units Subcutaneous QHS   . insulin lispro  1-5 Units Subcutaneous TID AC   . rivaroxaban  20 mg Oral Daily with dinner       PHYSICAL EXAM     Vitals:    02/06/18 1235   BP: 115/66   Pulse: 68   Resp: 20   Temp: 97.5 F (36.4 C)   SpO2: 98%       Temperature: Temp  Min: 95.5 F (35.3 C)  Max: 97.8 F (36.6 C)  Pulse: Pulse  Min: 59  Max: 68  Respiratory: Resp  Min: 18  Max: 20  Non-Invasive BP: BP  Min: 115/66  Max: 127/79  Pulse Oximetry SpO2  Min: 98 %  Max: 100 %    Intake and Output Summary (Last 24 hours) at Date Time    Intake/Output Summary (Last 24 hours) at 02/06/18 1414  Last data filed at 02/06/18 1100   Gross per 24 hour   Intake              550 ml   Output             2250 ml   Net            -1700 ml         GEN APPEARANCE: Normal;  A&OX3  HEENT: PERLA; EOMI; Conjunctiva Clear  NECK: Supple; No bruits  CVS: RRR, S1, S2; No  M/G/R  LUNGS: CTAB; No Wheezes; No Rhonchi: No rales  ABD: Soft; No TTP; + Normoactive BS  EXT: No edema; Pulses 2+ and intact  NEURO: CN 2-12 intact; No Focal neurological deficits    Exam done by Noralee Space, MD on 02/04/18 at 12:58 PM      LABS       Recent Labs  Lab 02/06/18  0831 02/05/18  0357 02/04/18  0439   WBC 5.61 6.17 5.55   RBC 3.77* 3.76* 3.48*   Hgb 9.8* 9.8* 9.2*   Hematocrit 31.9* 32.3* 29.9*   MCV 84.6 85.9 85.9   Platelets 225 248  228         Recent Labs  Lab 02/06/18  0831 02/05/18  0357 02/04/18  0439 02/03/18  1938   Sodium 141 142 144 143   Potassium 4.6 4.2 3.7 3.7   Chloride 106 107 108 107   CO2 26 26 30* 27   BUN 18.0 20.0* 18.0 18.0   Creatinine 0.8 0.9 1.0 0.9   Glucose 101* 96 112* 104*   Calcium 9.8 9.6 9.6 9.9         Recent Labs  Lab 02/03/18  1938   ALT 12   AST (SGOT) 12   Bilirubin, Total 0.3   Albumin 3.5   Alkaline Phosphatase 211*         Recent Labs  Lab 02/04/18  0439 02/03/18  2252 02/03/18  1938   Creatine Kinase (CK)  --   --  66   Troponin I <0.01 0.01 0.03             Microbiology Results     None           Mri Brain W Wo Contrast    Result Date: 02/05/2018    Examination within normal limits. Georgana Curio, MD 02/05/2018 2:09 PM    Xr Chest  Ap Portable    Result Date: 02/03/2018   No active disease is seen in the chest. Laurena Slimmer, MD 02/03/2018 7:26 PM    US Carotid Duplex Dopp Comp Bilateral    Result Date: 02/04/2018  1. Normal evaluation of the cervical carotid arteries and vertebral arteries. No evidence of significant stenotic disease. Georgiana Spinner, MD 02/04/2018 3:04 PM        Echo Results     None          RADIOLOGY     Upon my review:    SignedAsher Muir Haleema Vanderheyden  2:14 PM 02/06/2018

## 2018-02-07 ENCOUNTER — Encounter (INDEPENDENT_AMBULATORY_CARE_PROVIDER_SITE_OTHER): Payer: Self-pay

## 2018-02-07 LAB — CBC
Absolute NRBC: 0 10*3/uL (ref 0.00–0.00)
Hematocrit: 31.9 % — ABNORMAL LOW (ref 34.7–43.7)
Hgb: 9.8 g/dL — ABNORMAL LOW (ref 11.4–14.8)
MCH: 26.1 pg (ref 25.1–33.5)
MCHC: 30.7 g/dL — ABNORMAL LOW (ref 31.5–35.8)
MCV: 84.8 fL (ref 78.0–96.0)
MPV: 10.5 fL (ref 8.9–12.5)
Nucleated RBC: 0 /100 WBC (ref 0.0–0.0)
Platelets: 232 10*3/uL (ref 142–346)
RBC: 3.76 10*6/uL — ABNORMAL LOW (ref 3.90–5.10)
RDW: 15 % (ref 11–15)
WBC: 5.68 10*3/uL (ref 3.10–9.50)

## 2018-02-07 LAB — BASIC METABOLIC PANEL
Anion Gap: 8 (ref 5.0–15.0)
BUN: 18 mg/dL (ref 7.0–19.0)
CO2: 28 mEq/L (ref 22–29)
Calcium: 9.6 mg/dL (ref 8.5–10.5)
Chloride: 105 mEq/L (ref 100–111)
Creatinine: 0.9 mg/dL (ref 0.6–1.0)
Glucose: 106 mg/dL — ABNORMAL HIGH (ref 70–100)
Potassium: 4.1 mEq/L (ref 3.5–5.1)
Sodium: 141 mEq/L (ref 136–145)

## 2018-02-07 LAB — GLUCOSE WHOLE BLOOD - POCT
Whole Blood Glucose POCT: 88 mg/dL (ref 70–100)
Whole Blood Glucose POCT: 109 mg/dL — ABNORMAL HIGH (ref 70–100)
Whole Blood Glucose POCT: 130 mg/dL — ABNORMAL HIGH (ref 70–100)

## 2018-02-07 LAB — GFR: EGFR: >60

## 2018-02-07 LAB — HEMOLYSIS INDEX: Hemolysis Index: 2 (ref 0–18)

## 2018-02-07 MED ORDER — TECHNETIUM TC 99M TETROFOSMIN INJECTION
1.00 | Freq: Once | Status: AC | PRN
Start: 2018-02-06 — End: 2018-02-06
  Administered 2018-02-06: 13:00:00 1 via INTRAVENOUS

## 2018-02-07 MED ORDER — FUROSEMIDE 80 MG PO TABS
40.00 mg | ORAL_TABLET | Freq: Two times a day (BID) | ORAL | 0 refills | Status: AC
Start: 2018-02-07 — End: ?

## 2018-02-07 MED ORDER — TECHNETIUM TC 99M TETROFOSMIN INJECTION
1.00 | Freq: Once | Status: AC | PRN
Start: 2018-02-07 — End: 2018-02-07
  Administered 2018-02-07: 09:00:00 1 via INTRAVENOUS

## 2018-02-07 MED ORDER — FUROSEMIDE 80 MG PO TABS
40.00 mg | ORAL_TABLET | Freq: Every day | ORAL | 0 refills | Status: DC
Start: 2018-02-07 — End: 2018-02-07

## 2018-02-07 NOTE — Progress Notes (Signed)
[facility] PROGRESS NOTE      Date Time: 02/07/18 11:30 AM  Patient Name: Veronica Franco    Case Discussed with [facility] Cardiology Attending [facility]).      Assessment:     1. Chest pain: She has no ischemic symptoms this am. MPI did not show evidence of reversible defects.      2. Shortness of breath: her LV EF is normal. She has grade II diastolic dysfunction. There is pulmonary vascular congestion on CXR. Exam looks euvolemic today.     3. Discharge planning.   Plan:   1. Decrease Lasix to 40 mg PO bid.   2. Discharge planning...      I agree with the above impression, assessment, and plan, as stated. I saw and evaluated the patient independently. I performed a full physical examination and critically evaluated the assessment and plan as stated.      Arlyss Queen, MD, MPH, [facility], [facility]  Interventional Cardiology and Structural Heart Disease  [facility]    Patient Active Problem List   Diagnosis   . Syncope and collapse       I have spent 25 minutes of total face to face time with more than 50% spent counseling and/or coordination of care regarding chest pain and shortness of breath.     Subjective:   Denies chest pain, palpitations, lightheadedness, dizziness, SOB, orthopnea, PND, near syncope or syncope.    Medications:   Medications reviewed.    Current Medications   Current Facility-Administered Medications   Medication Dose Route Frequency   . furosemide  80 mg Oral BID   . insulin lispro  1-3 Units Subcutaneous QHS   . insulin lispro  1-5 Units Subcutaneous TID AC   . rivaroxaban  20 mg Oral Daily with dinner              Physical Exam:     Vitals:    02/07/18 0715   BP: 123/63   Pulse: 61   Resp: 18   Temp: 97.9 F (36.6 C)   SpO2: 100%     Temp (24hrs), Avg:97.7 F (36.5 C), Min:97.4 F (36.3 C), Max:98.5 F (36.9 C)    Intake and Output Summary (Last 24 hours) at Date Time    Intake/Output Summary (Last 24 hours) at 02/07/18 1130  Last data filed at 02/07/18  0756   Gross per 24 hour   Intake              250 ml   Output                0 ml   Net              250 ml     Wt Readings from Last 3 Encounters:   02/07/18 145.2 kg (320 lb 1.7 oz)     Vital signs reviewed    Telemetry reviewed:    General Appearance:  Breathing comfortable, no acute distress  Head:  normocephalic  Eyes:  EOM's intact, nonicteric sclera  Neck:  No carotid bruit or jugular venous distension  Lungs:  Clear to auscultation throughout, no wheezes, rhonchi or rales, good respiratory effort, symmetric chest expansion  Cardiac:  RRR, normal S1, S2, no S3, no S4, no rub, no murmurs   Abdomen:  Soft, non-tender, no rebound or guarding, non-distended, positive bowel sounds  Extremities:  No cyanosis or clubbing. No arthritis or deformities. No LE edema  Vascular: 2+  radial and distal pulses bilaterally  Neurologic:  Alert and oriented x3, mood and affect normal    Labs:   Labs reviewed    CBC w/Diff     Recent Labs  Lab 02/07/18  0450 02/06/18  0831 02/05/18  0357   WBC 5.68 5.61 6.17   Hgb 9.8* 9.8* 9.8*   Hematocrit 31.9* 31.9* 32.3*   Platelets 232 225 248          Basic Metabolic Profile     Recent Labs  Lab 02/07/18  0450 02/06/18  0831 02/05/18  0357   Sodium 141 141 142   Potassium 4.1 4.6 4.2   Chloride 105 106 107   CO2 28 26 26    BUN 18.0 18.0 20.0*   Creatinine 0.9 0.8 0.9   EGFR >60.0 >60.0 >60.0   Glucose 106* 101* 96   Calcium 9.6 9.8 9.6            Cardiac Enzymes     Recent Labs  Lab 02/04/18  0439 02/03/18  2252 02/03/18  1938   Creatine Kinase (CK)  --   --  66   Troponin I <0.01 0.01 0.03          Thyroid Studies         Invalid input(s): FREET4       Cholesterol Panel            Coagulation Studies                Imaging:     Independent review of telemetry shows     Independent review of EKG shows:  EKG ():   bpm  PR  ms  QRS  ms  QT/QT  ms    CXR():    2D Echo ():    Cardiac Cath ():    NM Stress Test ():

## 2018-02-07 NOTE — Plan of Care (Addendum)
Problem: Safety  Goal: Patient will be free from injury during hospitalization  Outcome: Progressing   02/07/18 0900   Goal/Interventions addressed this shift   Patient will be free from injury during hospitalization  Assess patient's risk for falls and implement fall prevention plan of care per policy;Provide and maintain safe environment;Use appropriate transfer methods;Ensure appropriate safety devices are available at the bedside;Include patient/ family/ care giver in decisions related to safety;Hourly rounding;Assess for patients risk for elopement and implement Elopement Risk Plan per policy       Problem: Everyday - Heart Failure  Goal: Stable Vital Signs and Fluid Balance  Outcome: Progressing   02/07/18 0900   Goal/Interventions addressed this shift   Stable Vital Signs and Fluid Balance Daily Standing Weights in the morning using the same scale, after using the bathroom and before breadfast. If unable to stand, zero the bed and use the bed scale;Monitor, assess vital signs and telemetry per policy;Assess for swelling/edema;Wean oxygen as needed if appropriate;Monitor labs and report abnormalities to physician;Strict Intake/Output;Fluid Restriction       Problem: Diabetes: Glucose Imbalance  Goal: Blood glucose stable at established goal  Outcome: Progressing   02/07/18 0900   Goal/Interventions addressed this shift   Blood glucose stable at established goal  Monitor lab values;Monitor intake and output. Notify LIP if urine output is < 30 mL/hour.;Follow fluid restrictions/IV/PO parameters;Include patient/family in decisions related to nutrition/dietary selections;Assess for hypoglycemia /hyperglycemia;Monitor/assess vital signs;Coordinate medication administration with meals, as indicated;Ensure appropriate diet and assess tolerance;Ensure adequate hydration;Ensure appropriate consults are obtained (Nutrition, Diabetes Education, and Case Management);Ensure patient/family has adequate teaching materials        Problem: Hemodynamic Status: Cardiac  Goal: Stable vital signs and fluid balance  Outcome: Progressing   02/07/18 0900   Goal/Interventions addressed this shift   Stable vital signs and fluid balance Monitor/assess vital signs and telemetry per unit protocol;Weigh on admission and record weight daily;Assess signs and symptoms associated with cardiac rhythm changes;Monitor intake/output per unit protocol and/or LIP order;Monitor lab values;Monitor for leg swelling/edema and report to LIP if abnormal       Problem: Inadequate Tissue Perfusion  Goal: Adequate tissue perfusion will be maintained  Outcome: Progressing   02/07/18 0900   Goal/Interventions addressed this shift   Adequate tissue perfusion will be maintained Monitor/assess vital signs;Monitor/assess lab values and report abnormal values;Monitor/assess neurovascular status (pulses, capillary refill, pain, paresthesia, paralysis, presence of edema);Monitor intake and output;Monitor/assess for signs of VTE (edema of calf/thigh redness, pain);Monitor for signs and symptoms of a pulmonary embolism (dyspnea, tachypnea, tachycardia, confusion)       Comments:   Pt is pleasant and kind.  Pt kept NPO for the second part of stress test today.  Denies chest pain or discomfort at this time.  VSS. On RA. o2 sat 100 %. Hx of CHF education provided.   Blood sugar closely monitored. SSI PRN.   Hourly rounding continued. Safety and fall precautions maintained.

## 2018-02-07 NOTE — Progress Notes (Signed)
Patient seen & cleared for discharge home per Dr. Saddie Benders. Patient education was conducted regarding the discharge instructions, new prescriptions and follow up appointments. Pt verbalized understanding. Opportunity for questions provided. All questions and concerns addressed.   IV d/cd and tele discontinued. Copy of med rec & discharge instruction given to pt. Patient left the unit with stable vital signs via wheelchair escorted by clinical technician with personal belongings. Wheelchair van to transport pt home.

## 2018-02-07 NOTE — Plan of Care (Signed)
Problem: Safety  Goal: Patient will be free from infection during hospitalization  Outcome: Progressing   02/07/18 0335   Goal/Interventions addressed this shift   Free from Infection during hospitalization Assess and monitor for signs and symptoms of infection;Monitor lab/diagnostic results       Problem: Everyday - Heart Failure  Goal: Stable Vital Signs and Fluid Balance  Outcome: Progressing   02/07/18 0335   Goal/Interventions addressed this shift   Stable Vital Signs and Fluid Balance Daily Standing Weights in the morning using the same scale, after using the bathroom and before breadfast. If unable to stand, zero the bed and use the bed scale;Monitor, assess vital signs and telemetry per policy;Monitor labs and report abnormalities to physician;Strict Intake/Output;Assess for swelling/edema       Problem: Hemodynamic Status: Cardiac  Goal: Stable vital signs and fluid balance  Outcome: Progressing   02/07/18 0335   Goal/Interventions addressed this shift   Stable vital signs and fluid balance Monitor/assess vital signs and telemetry per unit protocol;Weigh on admission and record weight daily;Assess signs and symptoms associated with cardiac rhythm changes;Monitor intake/output per unit protocol and/or LIP order;Monitor lab values;Monitor for leg swelling/edema and report to LIP if abnormal       Comments: Pt is NPO after MN for stress test today. VSS. On C-pap at night. Denies pain. continue to monitor changes.

## 2018-02-07 NOTE — Progress Notes (Signed)
Home Health Referral          Referral from [facility] (Case Manager) for home health care upon discharge.    By [name], the patient has the right to freely choose a home care provider.  Arrangements have been made with:     A company of the patients choosing. We have supplied the patient with a listing of providers in your area who asked to be included and participate in Medicare.   [facility], formerly [facility], a home care agency that provides adult home care services and participates in Medicare   The preferred provider of your insurance company. Choosing a home care provider other than your insurance company's preferred provider may affect your insurance coverage.      Home Health Discharge Information     Your doctor has ordered Physical Therapy in-home service(s) for you while you recuperate at home, to assist you in the transition from hospital to home.      The agency that you or your representative chose to provide the service:  Name of Home Health Agency Placement: Other (comment box) ([facility])] [phone]       The above services were set up by:  Hoover Brunette, RN  [facility] Liaison)   Phone [phone]       IF YOU HAVE NOT HEARD FROM YOUR HOME YOUR HOME HEALTH AGENCY WITHIN 24-48 HOURS AFTER DISCHARGE PLEASE CALL YOUR AGENCY TO ARRANGE A TIME FOR YOUR FIRST VISIT. FOR ANY SCHEDULING CONCERNS OR QUESTIONS RELATED TO HOME HEALTH, SUCH AS TIME OR DATE PLEASE CONTACT YOUR HOME HEALTH AGENCY AT THE NUMBER LISTED ABOVE.      Home Health face-to-face (FTF) Encounter (Order [id number])   Consult   Date: 02/07/2018 Department: Wyatt Mage Ordering/Authorizing: Noralee Space, MD   Order Information     Order Date/Time Release Date/Time Start Date/Time End Date/Time   02/07/18 03:06 PM None 02/07/18 03:04 PM 02/07/18 03:04 PM   Order Details     Frequency Duration Priority Order Class   Once 1 occurrence Routine  Hospital Performed   Standing Order Information     Remaining Occurrences Interval Last Released      0/1 Once 02/07/2018            Provider Information     Ordering User Ordering Provider Authorizing Provider   Hoover Brunette, RN Noralee Space, MD Noralee Space, MD   Attending Provider(s) Admitting Provider PCP   Ashley Jacobs, MD; Marya Amsler, MD; Noralee Space, MD Marya Amsler, MD Patsy Lager, MD   Verbal Order Info     Action Created on Order Mode Entered by Responsible Provider Signed by Signed on   Ordering 02/07/18 1506 Telephone with readback Hoover Brunette, RN Noralee Space, MD     Order Questions     Question Answer Comment   Date of face-to-face (FTF) encounter: 02/07/2018    Medical conditions that necessitate Home Health care: B. Functional impairment due to recent hospitalization/procedure/treatment    Clinical findings that support the need for Skilled Nursing. SN will: O. N/A    Clinical findings that support the need for Physical Therapy. PT will A. Evaluate and treat functional impairment and improve mobility     C. Educate on weight bearing status, stair/gait training, balance & coordination     D. Provide services to help restore function, mobility, and releive pain  F. Perform home safety assessment & develop safe in home exercise program     G. Implement activities to improve stance time, cadence & step length     I. Instruct on restorative activities to restore ability to perform ADL    Clinical findings support the need for OT (needs SN/PT order).OT will F. N/A    Clinical findings that support the need for SLP. ST will F. N/A    Per clinical findings, following services are medically necessary: PT    Evidence this patient is homebound because: B. Profound weakness, poor balance/unsteady gait d/t illness/treatment/procedure    Other (please specify) Dr. Lianne Bushy at [facility] clinic__Phone: [phone]          Process  Instructions     Please select Home Care Services medically necessary.     Based on the above findings, I certify that this patient is confined to the home and needs intermittent skilled nursing care, physical therapry and / or speech therapy or continues to need occupational therapy. The patient is under my care, and I have initiated the establishment of the plan of care. This patient will be followed by a physician who will periodically review the plan of care.         Collection Information     Consult Order Info     ID Description Priority Start Date Start Time   [id number] Home Health face-to-face (FTF) Encounter Routine 02/07/2018 3:04 PM   Provider Specialty Referred to   ______________________________________ _____________________________________   Acknowledgement Info     For At Acknowledged By Acknowledged On   Placing Order 02/07/18 1506 Hoover Brunette, RN 02/07/18 1506   Verbal Order Info     Action Created on Order Mode Entered by Responsible Provider Signed by Signed on   Ordering 02/07/18 1506 Telephone with Cornelious Bryant, RN Noralee Space, MD     Patient Information     Patient Name  Veronica Franco Sex  Female DOB  [birth year]   Additional Information     Associated Reports External References   Priority and Order Details [employer]       [facility] HEALTH REFERRAL       Patient's Name: Veronica Franco, Veronica Franco [facility] Date: 02/08/2018 for PT   DOB: 09-Dec-1965 From: Toniann Ket, RN  Post- Acute Care Coordinator  [name]  [phone]  [phone]     MRN: [id number] For Physician: Lianne Bushy at [facility]   Attending Physician: Noralee Space, MD For (PT,                Language/Communication Needs:   [place]          Isolation Precautions:   No active isolations      Diagnoses:     Patient Active Problem List    Diagnosis Date Noted   . HSyncope and collapse [R55] 02/03/2018       Admission Date:   02/03/2018    Discharge Date:    02/07/2018     Recent Surgery, Date of Surgery, and Provider Performing:       * Surgery not found *    Allergies:     Allergies   Allergen Reactions   . Codeine Hives           Height and Weight:     Ht Readings from Last 1 Encounters:   02/03/18 1.651 m (5\' 5" )     Wt Readings from  Last 1 Encounters:   02/07/18 145.2 kg (320 lb 1.7 oz)       Immunizations:     There is no immunization history on file for this patient.    Past Medical History:        Past Medical History:   Diagnosis Date   . Arthritis    . Congestive heart failure    . Sleep apnea        Past Surgical History:     Past Surgical History:   Procedure Laterality Date   . CHOLECYSTECTOMY     . HERNIA REPAIR     . HYSTERECTOMY         Social History:     Social History     Social History   . Marital status: Divorced     Spouse name: N/A   . Number of children: N/A   . Years of education: N/A     Social History Main Topics   . Smoking status: Never Smoker   . Smokeless tobacco: Never Used   . Alcohol use No   . Drug use: No   . Sexual activity: Not on file     Other Topics Concern   . Not on file     Social History Narrative   . No narrative on file       Willing and Able Caregiver:   Yes     SKILLED NURSING (SN): Skilled assessment and medication instruction   Veronica Franco is a 52 y.o. 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    Patient states that she had syncopal episode today.  She was outside all day long, not eating or drinking, felt lightheaded, associated with chest pain and palpitation. She sat on the floor and passed out for a few minutes.  She did not hit her head.  When she woke up, she was not confused.  She denies tongue bite, urine/bowel incontinence.  She denies fever, chills, cold/flulike symptoms, nausea, vomiting, change in urinary/bowel habit. She denies weakness/numbness, swallowing or speech problems.    PHYSICAL THERAPY (PT):     Social History:  Prior Level  of Function  Prior level of function: Needs assistance with ADLs, Ambulates with assistive device  Assistive Device: Single point cane  Baseline Activity Level: Community ambulation  Driving: does not drive  Dressing - Lower Body: minimal assist (W L sock & shoe)  Cooking: Yes  Employment: Disabled    Home Living Arrangements  Living Arrangements: Children, Family members  Type of Home: Apartment  Home Layout: One level, Stairs to enter with rails (add number in comment) (3 flights of stairs w/ 1 rail)  Bathroom Shower/Tub: Medical sales representative: Standard  Home Living - Notes / Comments: daughter & are home part time. Pt has HH PT for post rotator cuff surgery & arthritic knee which will be ending soon      Subjective:  Subjective: Pt feels close to her usual for mobility & ADL's   Patient is agreeable to participation in the therapy session. Nursing clears patient for therapy.   Patient Goal: return home  Pain Assessment  Pain Assessment: Numeric Scale (0-10)  Pain Score: 5-moderate pain  POSS Score: Awake and Alert  Pain Location: Head  Pain Orientation:  (all over)  Pain Descriptors: Aching  Pain Frequency: Continuous  Effect of Pain on Daily Activities: mild  Objective:   Observation of Patient/Vital Signs: VSS  Patient received in bed with telemetry  in place.    Cognitive Status and Neuro Exam:  Cognition/Neuro Status  Safety Awareness: independent  Behavior: attentive, calm, cooperative  Coordination: GMC impaired  Hand Dominance: mixed dominance    Musculoskeletal Examination  Gross ROM  Right Upper Extremity ROM: within functional limits (except GH flex 25 degrees)  Left Upper Extremity ROM: within functional limits  Right Lower Extremity ROM: within functional limits  Left Lower Extremity ROM: within functional limits    Sensory/Oculomotor Examination      Activities of Daily Living  Self-care and Home Management  Eating: Independent  Grooming: Independent  Bathing: Supervision  UB  Dressing: Independent  LB Dressing: Minimal Assist  Toileting: Independent  Functional Transfers: increased time to complete, Modified Independent (SPc)    Functional Mobility:  Mobility and Transfers  Supine to Sit: Independent  Sit to Stand: Modified Independent, Increased Time, Increased Effort  Functional Mobility/Ambulation: Modified Independent (SPc)     Balance  Balance  Static Sitting Balance: normal  Dyanamic Sitting Balance: normal  Static Standing Balance: good  Dynamic Standing Balance: good    Participation and Activity Tolerance  Participation and Endurance  Endurance: Tolerates 30 min exercise with multiple rests  [place] Dyspnea Scale: 1+ Dyspnea    Educated the patient to role of occupational therapy, plan of care, goals of therapy and safety with mobility and ADLs.    Patient left in bed with call bell and all personal items/needs within reach. RN notified of session outcome.       Assessment:  Veronica Franco is a 53 y.o. female admitted 02/03/2018. Patient would benefit from continued skilled OT to address deficits listed below and increase functional independence.      A Expanded chart review was completed including review of labs, review of imaging and review of vitals.  There are few comorbidities or other factors that affect plan of care and require modification of task including:recent surgery, assistive device at baseline, challenging home environment .  Pt demonstrates performance deficits with bathing and functional mobility.  [name] ability to complete ADLs and functional transfers is impaired due to the following deficits:  gait and strength.     Assessment: balance deficits;decreased ROM;decreased strength;decreased endurance/activity tolerance    Complexity Chart  Review Performance  Deficits Clinical Decision  Making Hx/Co-  morbidities Assistance needed   Low Brief 1-3 Limited options None None (or at baseline)       Treatment:  ADL's: Educated Pt on function &  resources for sock aide. Pt able to Don/doff L sock modified I w/ L LE placed in 1/2 tailor sit at EOb    Mobility: Pt ambulates 110 ft, including ascending/descending 12 stairs w/ 1 rail & SPC w/ supervision    Primary limiting factor: decreased ROM & str    Rehabilitation Potential: Prognosis: Good, With family    Plan:  OT Frequency Recommended: one time visit   Treatment Interventions: Functional transfer training, Endurance training, Equipment eval/education, Compensatory technique education     PMP Activity: Step 7 - Walks out of Room  Distance Walked (ft) (Step 6,7): 120 Feet (including up & down flight of stairs)    Risks/benefits/POC discussed    AM-PAC:yes  OT Daily Activity Raw Score: 22  CMS 0-100% Score: 25.80%    Goals:  Time For Goal Achievement: 1 visit  Mobility and Transfer Goals  Other Goal: Pt will ascend/descend 12  steps w/ supervision to access ADL items, VSS, goal met        DME Recommended for Discharge: Sock aid, [facility]  Discharge Recommendation: Home with home health PT (continue [facility] PT that she was participating in)        Demographics:      [facility]        Patient Name: Veronica Franco, Veronica Franco     MRN: [id number]     CSN: [id number]       Account Information    Hosp Acct #   [id number] Patient Class   Observation Service  Medicine Accommodation Code  Intermediate Care     Admission Information    Admitting Physician:  Attending Physician: Marya Amsler, MD  Noralee Space, MD Unit  AX 4 NORTH  L&D Status     Admitting Diagnosis: Syncope and collapse; Chest pain in adult; Syncope and collapse Room / Bed  A4022/A4022.01 L&D - Last Menstrual Cycle     Chief Complaint: Heat Exposure     Admit Type:  Admit Date/Time:  Discharge Date/Time: Emergency  02/03/2018 / 1755   /  Length of Stay: 0 Days   L&D EDD   Estimated Date of Delivery: None noted.     Patient Information            Home Address: [address] [place]  [place]  [address] Employer:  Employer Address:     ,     Main Phone: [phone] Employer Phone:    SSN: [id number]     DOB: 30-Apr-1966 (51 yrs)     Sex: Female Primary Care Physician: Patsy Lager, MD   Marital Status: Divorced Referring Physician:       No ref. provider found   Race: Black or African American     Ethnicity: Non Hispanic/Latino     Emergency Contacts  Name Home Phone Work Phone Mobile Phone Relationship Blaine Hamper   [phone] Sister in l*         Guarantor Information    Guarantor Name: Veronica Franco, Veronica Franco Guarantor ID: [id number]   Guarantor Relationship to Pt: Self Guarantor Type: Personal/Family   Guarantor DOB:   05/10/1966     Guarantor Address: [address]   [place] [address]       Guarantor Home Phone: [phone] Guarantor Employer:        Guarantor Work Phone:  Pensions consultant Emp Phone:               Chief Technology Officer Name: [employer] A [employer] B Subscriber Name: [facility]   Insurance Address:   [address]  [place], [place] [address] Subscriber DOB: 1966-07-29     Subscriber ID: [id number]   Insurance Phone:  Pt Relationship to Sub:   Self   Insurance ID:      Group Name:  Preauthorization #: [id number]   Group #:  Preauthorization Days:      Art gallery manager Name: Angelina Pih Subscriber Name: Veronica Franco   Insurance Address:   [address] [place], [place] [address] Subscriber DOB: 10-Jan-1966     Subscriber ID: [id number]   Insurance Phone: [phone] Pt Relationship to Sub:   Self   Insurance ID:      Group Name:  Preauthorization #:    Group #:  Preauthorization Days:  02/07/2018 3:16 PM           Signed by: Hoover Brunette, RN  Date/Time: 02/07/2018, 3:10 PM

## 2018-02-07 NOTE — Progress Notes (Signed)
[place] WRITTEN @ 1058  DCP: Home w/ HH PT (referred and will see pt prior to [place]).     TCM appt made w/ NP Williams for 02/14/18 @ 0930. WC Van to transport. Will f/u PRN.        02/07/18 1215   Discharge Disposition   Patient preference/choice provided? Yes   Physical Discharge Disposition Home, Home Health   Name of Referred Home Health Agency (TBD)   Name of Home Health Agency Placement (TBD)   Mode of Transportation Wheelchair [place]  (pending stress test results)   Patient/Family/POA notified of transfer plan Yes;Patient informed only   Patient agreeable to discharge plan/expected d/c date? Yes   Family/POA agreeable to discharge plan/expected d/c date? Yes   Outpatient Services   Home Health Home PT/OT/ST   CM Interventions   Follow up appointment scheduled? Yes  (TCM 5/31 @ 0930 W/ NP Williams)   Medicare Checklist   Is this a Medicare patient? Yes   Patient received 1st IMM Letter? Yes  (02/03/18)       Edmon Crape, RN, BSN  RN Case Manager  [phone]

## 2018-02-07 NOTE — Progress Notes (Signed)
WC Van @ 1900.  Patient and RN updated. Will f/u PRN.     Edmon Crape, RN, Tax adviser Case Manager  [phone]

## 2018-02-07 NOTE — Discharge Instr - AVS First Page (Addendum)
Reason for your Hospital Admission:  Chest pain       Instructions for after your discharge:  Follow with PCP and Cardiology as an outpatient     Home Health Discharge Information     Your doctor has ordered Physical Therapy in-home service(s) for you while you recuperate at home, to assist you in the transition from hospital to home.      The agency that you or your representative chose to provide the service:  Name of Home Health Agency Placement: Other (comment box) ([facility])] [phone]       The above services were set up by:  Hoover Brunette, RN  [facility] Liaison)   Phone [phone]       IF YOU HAVE NOT HEARD FROM YOUR HOME YOUR HOME HEALTH AGENCY WITHIN 24-48 HOURS AFTER DISCHARGE PLEASE CALL YOUR AGENCY TO ARRANGE A TIME FOR YOUR FIRST VISIT. FOR ANY SCHEDULING CONCERNS OR QUESTIONS RELATED TO HOME HEALTH, SUCH AS TIME OR DATE PLEASE CONTACT YOUR HOME HEALTH AGENCY AT THE NUMBER LISTED ABOVE.

## 2018-02-07 NOTE — Discharge Summary (Signed)
[facility]      Patient: Veronica Franco  Admission Date: 02/03/2018   DOB: 28-Mar-1966  Discharge Date:    MRN: [id number]  Discharge Attending:Nadir Vasques Vashti Hey     Referring Physician: Patsy Lager, MD  PCP: Patsy Lager, MD       DISCHARGE SUMMARY     Discharge Information   Admission Diagnosis:   <principal problem not specified>    Discharge Diagnosis:   Active Hospital Problems    Diagnosis   . Syncope and collapse        Admission Condition: Guarded  Discharge Condition: Stable  Consultants:   Functional Status:   Discharged to:   Procedures:  Surgeries:  Procedures:    Imaging:     Mri Brain W Wo Contrast    Result Date: 02/05/2018    Examination within normal limits. Georgana Curio, MD 02/05/2018 2:09 PM    Xr Chest  Ap Portable    Result Date: 02/03/2018   No active disease is seen in the chest. Laurena Slimmer, MD 02/03/2018 7:26 PM    US Carotid Duplex Dopp Comp Bilateral    Result Date: 02/04/2018  1. Normal evaluation of the cervical carotid arteries and vertebral arteries. No evidence of significant stenotic disease. Georgiana Spinner, MD 02/04/2018 3:04 PM     Echo Results     Procedure Component Value Units Date/Time    Echocardiogram Adult Complete W Color Doppler Waveform [[id number]] Collected:  02/04/18 1352     Updated:  02/05/18 0826    Narrative:       Name:     Veronica Franco  Age:     52 years  DOB:     November 03, 1965  Gender:     Female  MRN:     [id number]  Ht:     65 in  Wt:     319 lb  BSA:     2.67 m2  HR:     65 bpm  Systolic BP:     130 mmHg  Diastolic BP:     62 mmHg  Technical Quality:     Good  Exam Date/Time:     02/04/2018 1:52 PM  Exam Type:     ECHOCARDIOGRAM ADULT COMPLETE W CLR/ DOPP WAVEFORM  Technically difficult due to:     Body Habitus    Staff  Sonographer:     Bubba Camp, RDCS  Ordering Physician:     Noralee Space    Study Info  Indications       - syncope  Procedure    Complete two-dimensional, color flow and spectral Doppler  transthoracic  echocardiogram is performed.      History/Risk Factors  Diabetes Mellitus:     Yes    Additional Patient History    History of CHF and pulmonary embolism with IVC filter placement. No prior  echocardiogram history available for comparison.      Summary    * Left ventricular ejection fraction is normal with an estimated ejection  fraction of 60-65%.    * No pericardial effusion visualized.      Findings  Left Ventricle    The left ventricle is normal in size.    There is moderate concentric left ventricular hypertrophy.    Left ventricular ejection fraction is normal with an estimated ejection  fraction of 60-65%.    Left ventricular diastolic filling parameters are consistent with Grade II  diastolic dysfunction (pseudonormal pattern).  Right Ventricle  The right ventricular cavity size is normal in size.    Normal right ventricular systolic function.      Left Atrium    The left atrium is normal in size.    Right Atrium    The right atrium is normal in size.    Atrial Septum    No evidence of interatrial shunt by color Doppler.      Aortic Valve    The aortic valve is tricuspid.    There is no aortic stenosis.    There is no aortic regurgitation.    Pulmonary Valve    The pulmonic valve is structurally normal.    There is no pulmonic regurgitation.    Mitral Valve    The mitral valve is structurally normal.    There is no mitral regurgitation.    Tricuspid Valve    The tricuspid valve is structurally normal.    There is no tricuspid regurgitation.    No pulmonary hypertension with estimated right ventricular systolic pressure  of 27.75 mmHg.      Pericardium / Pleural Effusion    No pericardial effusion visualized.    Inferior Vena Cava    The IVC is normal in size with > 50% respiratory variance consistent with  normal RA pressure of 3 mmHg.    Aorta    The aortic root is normal in size.    The ascending aorta is normal in size.      Measurements  2D  Measurements  ----------------------------------------------------------------------  Name                                 Value        Normal  ----------------------------------------------------------------------    Parasternal 2D  ----------------------------------------------------------------------  RVID Diastole (2D)                 4.68 cm     2.50-3.50   IVS Diastolic Thickness (2D)       4.40 cm     0.60-0.90   LVID Diastole (2D)                 5.27 cm     3.80-5.20   LVIW Diastolic Thickness  (2D)                               1.30 cm     0.60-0.90   LVID Systole (2D)                  3.18 cm     2.20-3.50     Apical 2D Dimensions  ----------------------------------------------------------------------  LA Volume Index (BP A-L)       29.75 ml/m2   16.00-34.00  M-mode Measurements  ----------------------------------------------------------------------  Name                                 Value        Normal  ----------------------------------------------------------------------    M-Mode  ----------------------------------------------------------------------  Ao Root Diameter (MM)              3.31 cm                 LA Dimension (MM)                  4.38 cm  2.70-3.80   AV Cusp Sep (MM)                   2.05 cm                 TAPSE                              2.40 cm        >=1.60  LVOT/Aortic Valve Doppler Measurements  ----------------------------------------------------------------------  Name                                 Value        Normal  ----------------------------------------------------------------------    LVOT Doppler  ----------------------------------------------------------------------  LVOT Peak Velocity                1.32 m/s                   AoV Doppler  ----------------------------------------------------------------------  AV Peak Velocity                  2.01 m/s                 AV Peak Gradient                16.24 mmHg  RVOT/Pulmonic Valve Doppler  Measurements  ----------------------------------------------------------------------  Name                                 Value        Normal  ----------------------------------------------------------------------    PV Doppler  ----------------------------------------------------------------------  PV Peak Velocity                  1.25 m/s  Mitral Valve Measurements  ----------------------------------------------------------------------  Name                                 Value        Normal  ----------------------------------------------------------------------    MV Doppler  ----------------------------------------------------------------------  MV E Peak Velocity                0.79 m/s                 MV A Peak Velocity                0.49 m/s                 MV E/A                                1.61                   MV Annular TDI  ----------------------------------------------------------------------  MV E/e' (Septal)                      7.10        <=8.00   MV E/e' (Lateral)                     7.04        <=8.00  Tricuspid Valve Measurements  ----------------------------------------------------------------------  Name  Value        Normal  ----------------------------------------------------------------------    TV Doppler  ----------------------------------------------------------------------  TV E Peak Velocity                0.60 m/s                   TV Regurgitation Doppler  ----------------------------------------------------------------------  TR Peak Velocity                  2.49 m/s                 TR Peak Gradient                24.75 mmHg                 RA Pressure                      3.00 mmHg        <=3.00   RV Systolic Pressure            27.75 mmHg        <36.00  Aorta / Venous Measurements  ----------------------------------------------------------------------  Name                                 Value         Normal  ----------------------------------------------------------------------    IVC/SVC  ----------------------------------------------------------------------  IVC Diameter (Exp 2D)              1.66 cm        <=2.10      Report Signatures  Finalized by:M. Rafiq  Zaheer on 02/05/2018 8:26:13 AM  Promoted [id number]  [place] on 02/04/2018 3:15:39 PM        Discharge Medications:     Medication List      CONTINUE taking these medications    atorvastatin 20 MG tablet  Commonly known as:  LIPITOR     furosemide 80 MG tablet  Commonly known as:  LASIX     metFORMIN 500 MG tablet  Commonly known as:  GLUCOPHAGE     multivitamin with minerals tablet     topiramate 100 MG tablet  Commonly known as:  TOPAMAX     XARELTO 20 MG Tabs  Generic drug:  rivaroxaban                Hospital Course   Presentation History / Hospital Course (0 Days):    This is a 52 yo female was admitted to the hospital because of Syncope and collapse,  MRI and carotid study done    Cardiology was consulted,   Denies any chest pain   Patient with history of PE s/p IVC filter   CXR: shows cardiomegaly with venous congestion  Patient with history of DM2  Patient will be [place] home to follow with cardio and PCP as an outpatient     RECOMMENDATIONS:    - Patient was informed of abnormal and incidental imaging findings during hospitalization, and advised to review this information with their  medical provider.             Best Practices   Was the patient admitted with either a CHF Exacerbation or Pneumonia? NO     Progress Note/Physical Exam at Discharge     Subjective: Patient reported feeling well, and is ready for discharge.    Vitals:  02/06/18 2300 02/07/18 0445 02/07/18 0715 02/07/18 1201   BP: 106/61 118/60 123/63 120/64   Pulse: 63 (!) 58 61 61   Resp: 18 18 18 18    Temp: 97.4 F (36.3 C) 98.5 F (36.9 C) 97.9 F (36.6 C) 97.9 F (36.6 C)   TempSrc: Oral Oral Oral Oral   SpO2: 98% 99% 100% 97%   Weight:  145.2 kg (320 lb 1.7 oz)     Height:            General: NAD  HEENT: sclera anicteric, OP: Clear, MMM  Cardiovascular: RRR, no m/r/g  Lungs: CTAB, no w/r/r  Abdomen: soft, +BS, NT/ND, no masses, no g/r  Extremities: Warm and well perfused  Skin: no rashes or lesions noted on exposed surfaces  Neuro: Answers questions appropriately, responds to commands       Diagnostics     Labs/Studies Pending at Discharge:    Last Labs     Recent Labs  Lab 02/07/18  0450 02/06/18  0831 02/05/18  0357   WBC 5.68 5.61 6.17   RBC 3.76* 3.77* 3.76*   Hgb 9.8* 9.8* 9.8*   Hematocrit 31.9* 31.9* 32.3*   MCV 84.8 84.6 85.9   Platelets 232 225 248         Recent Labs  Lab 02/07/18  0450 02/06/18  0831 02/05/18  0357 02/04/18  0439 02/03/18  1938   Sodium 141 141 142 144 143   Potassium 4.1 4.6 4.2 3.7 3.7   Chloride 105 106 107 108 107   CO2 28 26 26  30* 27   BUN 18.0 18.0 20.0* 18.0 18.0   Creatinine 0.9 0.8 0.9 1.0 0.9   Glucose 106* 101* 96 112* 104*   Calcium 9.6 9.8 9.6 9.6 9.9       Microbiology Results     None           Patient Instructions   Discharge Diet: Heart healthy  Discharge Activity: As tolerated  LABS/TESTING recommended after discharge    Follow Up Appointment:  Follow-up Information     Pcp, [place], MD .                  Time spent examining patient, discussing with patient/family regarding hospital course, chart review, reconciling medications and discharge planning: > 35 minutes.  This patient was examined by me on , the day of discharge.    Cephus Slater Mylani Gentry    2:15 PM 02/07/2018

## 2018-02-07 NOTE — Progress Notes (Signed)
[facility] ([facility])    Received a referral to schedule a follow up appointment with the [facility]  Appointment scheduled for Friday 02/14/18 at 9:30AM at the [facility] location.      Clinic address is as follows:      [address]. [place], [address]      Please notify patient to arrive 15 minutes early to the appointment and bring the following materials with them:    Insurance card (if insured) and photo ID  Medications in their original bottles  Glucometer/blood sugar log (if diabetic)  Weight log (if heart failure)  Proof of income (to enroll in medication assistance programs-first two pages of signed 1040 tax forms or last 2 months of pay stubs)      Terressa Koyanagi  Transitional Services   Sched Reg Rep II  T [phone]  F [phone]

## 2018-02-14 ENCOUNTER — Encounter (INDEPENDENT_AMBULATORY_CARE_PROVIDER_SITE_OTHER): Payer: Self-pay

## 2018-02-14 ENCOUNTER — Other Ambulatory Visit: Payer: Medicare Other

## 2018-02-14 ENCOUNTER — Other Ambulatory Visit (FREE_STANDING_LABORATORY_FACILITY): Payer: Medicare Other

## 2018-02-14 ENCOUNTER — Ambulatory Visit (INDEPENDENT_AMBULATORY_CARE_PROVIDER_SITE_OTHER): Payer: Medicare Other | Admitting: Nurse Practitioner

## 2018-02-14 VITALS — BP 112/77 | HR 70 | Temp 98.0°F | Resp 16 | Wt 321.0 lb

## 2018-02-14 DIAGNOSIS — E119 Type 2 diabetes mellitus without complications: Secondary | ICD-10-CM

## 2018-02-14 DIAGNOSIS — J4540 Moderate persistent asthma, uncomplicated: Secondary | ICD-10-CM

## 2018-02-14 DIAGNOSIS — I509 Heart failure, unspecified: Secondary | ICD-10-CM

## 2018-02-14 DIAGNOSIS — R739 Hyperglycemia, unspecified: Secondary | ICD-10-CM

## 2018-02-14 DIAGNOSIS — I2699 Other pulmonary embolism without acute cor pulmonale: Secondary | ICD-10-CM | POA: Insufficient documentation

## 2018-02-14 DIAGNOSIS — G4733 Obstructive sleep apnea (adult) (pediatric): Secondary | ICD-10-CM

## 2018-02-14 DIAGNOSIS — E1165 Type 2 diabetes mellitus with hyperglycemia: Secondary | ICD-10-CM

## 2018-02-14 DIAGNOSIS — Z9989 Dependence on other enabling machines and devices: Secondary | ICD-10-CM

## 2018-02-14 DIAGNOSIS — R569 Unspecified convulsions: Secondary | ICD-10-CM | POA: Insufficient documentation

## 2018-02-14 LAB — POCT GLUCOSE: Whole Blood Glucose POCT: 139 mg/dL — AB (ref 70–100)

## 2018-02-14 LAB — LIPID PANEL
Cholesterol / HDL Ratio: 5.8
Cholesterol: 185 mg/dL (ref 0–199)
HDL: 32 mg/dL — ABNORMAL LOW (ref 40–9999)
LDL Calculated: 118 mg/dL — ABNORMAL HIGH (ref 0–99)
Triglycerides: 176 mg/dL — ABNORMAL HIGH (ref 34–149)
VLDL Calculated: 35 mg/dL (ref 10–40)

## 2018-02-14 LAB — HEMOLYSIS INDEX: Hemolysis Index: 1 (ref 0–18)

## 2018-02-14 NOTE — Progress Notes (Signed)
Chief Complaint   Patient presents with   . Shortness of Breath       History of Present Illness:     This patient is a 52 y.o. female here for f/u after recent ER visit for syncope.     She presents today without complaints. She states that she had been walking in the heat and was dehydrated prior to her syncopal episode. She is currently being treated with the [facility].     She reports medication compliance. She denies cp, sob or edema.    Past Medical History:   Diagnosis Date   . Arthritis    . Congestive heart failure    . Sleep apnea        Past Surgical History:   Procedure Laterality Date   . CHOLECYSTECTOMY     . HERNIA REPAIR     . HYSTERECTOMY         No family history on file.    Social History     Social History   . Marital status: Divorced     Spouse name: N/A   . Number of children: N/A   . Years of education: N/A     Occupational History   . Not on file.     Social History Main Topics   . Smoking status: Never Smoker   . Smokeless tobacco: Never Used   . Alcohol use No   . Drug use: No   . Sexual activity: Not on file     Other Topics Concern   . Not on file     Social History Narrative   . No narrative on file       Allergies   Allergen Reactions   . Codeine Hives         Current Outpatient Prescriptions:   .  albuterol (PROVENTIL HFA;VENTOLIN HFA) 108 (90 Base) MCG/ACT inhaler, Inhale 2 puffs into the lungs every 4 (four) hours as needed for Wheezing, Disp: , Rfl:   .  atorvastatin (LIPITOR) 20 MG tablet, Take 20 mg by mouth nightly, Disp: , Rfl:   .  budesonide-formoterol (SYMBICORT) 160-4.5 MCG/ACT inhaler, Inhale 2 puffs into the lungs 2 (two) times daily, Disp: , Rfl:   .  furosemide (LASIX) 80 MG tablet, Take 0.5 tablets (40 mg total) by mouth 2 (two) times daily, Disp: 60 tablet, Rfl: 0  .  metFORMIN (GLUCOPHAGE) 500 MG tablet, Take 500 mg by mouth 2 (two) times daily with meals, Disp: , Rfl:   .  Multiple Vitamins-Minerals (MULTIVITAMIN WITH MINERALS) tablet, Take 1 tablet by  mouth daily, Disp: , Rfl:   .  rivaroxaban (XARELTO) 20 MG Tab, Take 20 mg by mouth daily with dinner   , Disp: , Rfl:   .  topiramate (TOPAMAX) 100 MG tablet, Take 100 mg by mouth 2 (two) times daily, Disp: , Rfl:     ROS    All systems reviewed and negative except as described above.    Physical Exam:     Vitals:    02/14/18 0855   BP: 112/77   Pulse: 70   Resp: 16   Temp: 98 F (36.7 C)   SpO2: 98%     Wt Readings from Last 3 Encounters:   02/14/18 145.6 kg (321 lb)   02/07/18 145.2 kg (320 lb 1.7 oz)       General: awake, alert, oriented x 3  HEENT: perrla, eomi, sclera anicteric,oropharynx clear without lesions, mucous membranes moist  Neck: supple, no lymphadenopathy, no thyromegaly, no JVD, no carotid bruits  Cardiovascular: S1, S2, regular rate and rhythm, no murmurs, rubs, or gallops  Lungs: clear to auscultation bilaterally, without wheezing, rhonchi, or rales  Abdomen: soft, non tender, normal bowel sounds  Extremities: no clubbing, cyanosis, or edema  Skin: no rashes or lesions noted      Diagnostics:     Lab Results   Component Value Date    WBC 5.68 02/07/2018    HGB 9.8 (L) 02/07/2018    HCT 31.9 (L) 02/07/2018    PLT 232 02/07/2018    CHOL 185 02/14/2018    TRIG 176 (H) 02/14/2018    HDL 32 (L) 02/14/2018    LDL 118 (H) 02/14/2018    ALT 12 02/03/2018    AST 12 02/03/2018    NA 141 02/07/2018    K 4.1 02/07/2018    CL 105 02/07/2018    CREAT 0.9 02/07/2018    BUN 18.0 02/07/2018    CO2 28 02/07/2018    GLU 106 (H) 02/07/2018    HGBA1C 5.7 02/04/2018       No results found.    Active Problems:     Patient Active Problem List   Diagnosis   . Syncope and collapse   . Seizures   . Moderate persistent asthma without complication   . OSA on CPAP   . PE (pulmonary thromboembolism)       Assessment/Plan:       Aliyana was seen today for shortness of breath.    Diagnoses and all orders for this visit:      #Chronic congestive heart failure, unspecified heart failure type  -  Check Lipid panel; Future  - Cont  lipitor, lasix    #Type 2 diabetes mellitus without complication, without long-term current use of insulin  - Cont metformin    #Seizures  - Cont topamax    #Moderate persistent asthma without complication  - Cont symbicort, albuterol prn    OSA on CPAP    #PE (pulmonary thromboembolism)  - Cont xarelto        Follow up: 2 weeks  Discharged from [facility]?: no  Medical Home Referred to/Referred Back to: [facility]     Med Rec Complete: yes  Referred to [facility] for diabetes A1c > 8.5: na  Given commitment to care documents when appropriate: CM  Teach back for HF, COPD, DM, AMI, PNA: HF

## 2018-02-14 NOTE — Progress Notes (Signed)
Discussed the importance of establishing a medical home for ongoing care.  Provided patient with informational brochure for [facility] ([facility]). Patient verbalized understanding. No questions at this time.

## 2018-02-27 ENCOUNTER — Ambulatory Visit (INDEPENDENT_AMBULATORY_CARE_PROVIDER_SITE_OTHER): Payer: Medicare Other | Admitting: Nurse Practitioner

## 2018-02-27 ENCOUNTER — Ambulatory Visit (INDEPENDENT_AMBULATORY_CARE_PROVIDER_SITE_OTHER): Payer: Medicare Other

## 2018-02-27 ENCOUNTER — Emergency Department
Admission: EM | Admit: 2018-02-27 | Discharge: 2018-02-27 | Disposition: A | Discharge status: Home / Self Care | Payer: Medicare Other | Attending: Emergency Medicine | Admitting: Emergency Medicine

## 2018-02-27 ENCOUNTER — Emergency Department: Payer: Medicare Other

## 2018-02-27 VITALS — BP 119/70 | HR 66 | Temp 98.1°F | Resp 16 | Wt 323.0 lb

## 2018-02-27 DIAGNOSIS — R0789 Other chest pain: Secondary | ICD-10-CM | POA: Insufficient documentation

## 2018-02-27 DIAGNOSIS — Z7901 Long term (current) use of anticoagulants: Secondary | ICD-10-CM | POA: Insufficient documentation

## 2018-02-27 DIAGNOSIS — R079 Chest pain, unspecified: Secondary | ICD-10-CM

## 2018-02-27 DIAGNOSIS — I509 Heart failure, unspecified: Secondary | ICD-10-CM | POA: Insufficient documentation

## 2018-02-27 DIAGNOSIS — J45909 Unspecified asthma, uncomplicated: Secondary | ICD-10-CM | POA: Insufficient documentation

## 2018-02-27 DIAGNOSIS — F419 Anxiety disorder, unspecified: Secondary | ICD-10-CM | POA: Insufficient documentation

## 2018-02-27 DIAGNOSIS — I440 Atrioventricular block, first degree: Secondary | ICD-10-CM

## 2018-02-27 DIAGNOSIS — Z79899 Other long term (current) drug therapy: Secondary | ICD-10-CM | POA: Insufficient documentation

## 2018-02-27 DIAGNOSIS — F418 Other specified anxiety disorders: Secondary | ICD-10-CM

## 2018-02-27 HISTORY — DX: Unspecified convulsions: R56.9

## 2018-02-27 HISTORY — DX: Unspecified asthma, uncomplicated: J45.909

## 2018-02-27 LAB — CBC AND DIFFERENTIAL
Absolute NRBC: 0 10*3/uL (ref 0.00–0.00)
Basophils Absolute Automated: 0.03 10*3/uL (ref 0.00–0.08)
Basophils Automated: 0.4 %
Eosinophils Absolute Automated: 0.13 10*3/uL (ref 0.00–0.44)
Eosinophils Automated: 1.5 %
Hematocrit: 34.5 % — ABNORMAL LOW (ref 34.7–43.7)
Hgb: 10.5 g/dL — ABNORMAL LOW (ref 11.4–14.8)
Immature Granulocytes Absolute: 0.02 10*3/uL (ref 0.00–0.07)
Immature Granulocytes: 0.2 %
Lymphocytes Absolute Automated: 2.18 10*3/uL (ref 0.42–3.22)
Lymphocytes Automated: 25.9 %
MCH: 25.7 pg (ref 25.1–33.5)
MCHC: 30.4 g/dL — ABNORMAL LOW (ref 31.5–35.8)
MCV: 84.6 fL (ref 78.0–96.0)
MPV: 10.6 fL (ref 8.9–12.5)
Monocytes Absolute Automated: 0.53 10*3/uL (ref 0.21–0.85)
Monocytes: 6.3 %
Neutrophils Absolute: 5.54 10*3/uL (ref 1.10–6.33)
Neutrophils: 65.7 %
Nucleated RBC: 0 /100 WBC (ref 0.0–0.0)
Platelets: 236 10*3/uL (ref 142–346)
RBC: 4.08 10*6/uL (ref 3.90–5.10)
RDW: 15 % (ref 11–15)
WBC: 8.43 10*3/uL (ref 3.10–9.50)

## 2018-02-27 LAB — COMPREHENSIVE METABOLIC PANEL
ALT: 12 U/L (ref 0–55)
AST (SGOT): 16 U/L (ref 5–34)
Albumin/Globulin Ratio: 0.9 (ref 0.9–2.2)
Albumin: 3.7 g/dL (ref 3.5–5.0)
Alkaline Phosphatase: 224 U/L — ABNORMAL HIGH (ref 37–106)
Anion Gap: 10 (ref 5.0–15.0)
BUN: 25 mg/dL — ABNORMAL HIGH (ref 7.0–19.0)
Bilirubin, Total: 0.3 mg/dL (ref 0.2–1.2)
CO2: 27 mEq/L (ref 22–29)
Calcium: 10.1 mg/dL (ref 8.5–10.5)
Chloride: 103 mEq/L (ref 100–111)
Creatinine: 1 mg/dL (ref 0.6–1.0)
Globulin: 4.2 g/dL — ABNORMAL HIGH (ref 2.0–3.6)
Glucose: 98 mg/dL (ref 70–100)
Potassium: 4.4 mEq/L (ref 3.5–5.1)
Protein, Total: 7.9 g/dL (ref 6.0–8.3)
Sodium: 140 mEq/L (ref 136–145)

## 2018-02-27 LAB — B-TYPE NATRIURETIC PEPTIDE: B-Natriuretic Peptide: 24 pg/mL (ref 0–100)

## 2018-02-27 LAB — HEMOLYSIS INDEX: Hemolysis Index: 33 — ABNORMAL HIGH (ref 0–18)

## 2018-02-27 LAB — GFR: EGFR: >60

## 2018-02-27 LAB — TROPONIN I: Troponin I: <0.01 ng/mL (ref 0.00–0.09)

## 2018-02-27 MED ORDER — IOHEXOL 350 MG/ML IV SOLN
100.00 mL | Freq: Once | INTRAVENOUS | Status: AC | PRN
Start: 2018-02-27 — End: 2018-02-27
  Administered 2018-02-27: 12:00:00 100 mL via INTRAVENOUS

## 2018-02-27 MED ORDER — LORAZEPAM 2 MG/ML IJ SOLN
1.00 mg | Freq: Once | INTRAMUSCULAR | Status: AC
Start: 2018-02-27 — End: 2018-02-27
  Administered 2018-02-27: 12:00:00 1 mg via INTRAVENOUS
  Filled 2018-02-27: qty 1

## 2018-02-27 MED ORDER — ALUM & MAG HYDROXIDE-SIMETH 200-200-20 MG/5ML PO SUSP
30.00 mL | Freq: Once | ORAL | Status: AC
Start: 2018-02-27 — End: 2018-02-27
  Administered 2018-02-27: 12:00:00 30 mL via ORAL
  Filled 2018-02-27: qty 30

## 2018-02-27 NOTE — Discharge Instructions (Signed)
Musculoskeletal Chest Pain    You have been diagnosed with musculoskeletal chest pain.    Your pain is due to an injury or inflammation (swelling) of the muscles, ligaments, cartilage (soft bone), or bone in your chest. The pain is usually sharp and knife-like and becomes worse with twisting, bending, or moving. It commonly occurs in a small area, and can be irritated by pressing on it. There is usually no shortness of breath, lightheadedness, weakness, or sweaty feeling. Some children will have pain when taking a deep breath or when coughing. Exercise usually does not affect these symptoms.    Musculoskeletal chest pain is treated with anti-inflammatory medications like ibuprofen (Advil or Motrin) or naproxen (Aleve). Other pain medications are usually not needed. Depending on the reason for your symptoms, either warm or cool compresses (damp washcloths laid on the skin) may be helpful.    Most musculoskeletal chest pain improves over several days.    YOU SHOULD SEEK MEDICAL ATTENTION IMMEDIATELY, EITHER HERE OR AT THE NEAREST EMERGENCY DEPARTMENT, IF ANY OF THE FOLLOWING OCCURS:   Your pain gets worse.   Your pain makes you feel short of breath, nauseated, or sweaty.   You notice that your pain gets worse as you walk, go up stairs, or exert yourself.   You have any weakness or lightheadedness with your pain.   Your pain makes breathing difficult.   You develop a swollen leg.   Your symptoms get worse or you have other concerns.     If you can't follow up with your doctor, or if at any time you feel you need to be rechecked or seen again, come back here or go to the nearest emergency department.

## 2018-02-27 NOTE — Progress Notes (Signed)
LPN A. Julio Alm reported patient complaint of SOB and chest pain while obtaining vitals signs.    Patient reports persistent, left-sided chest pain, mid-clavicular, at rib border radiating to neck and jaw.  Began this am when she awoke.  Reports feeling "gassy".  No nausea/vommiting reported. Obtained EKG per NP N. Williams.     Upon assessment from NP [facility] called ambulance and administered q=4 81 mg tablets chewable aspirin PO (324 mg total) per NP Williams.    Per patient request, called patient's daughter [phone]).

## 2018-02-27 NOTE — ED Provider Notes (Signed)
EMERGENCY DEPARTMENT HISTORY AND PHYSICAL EXAM    Date: 02/27/2018  Patient Name: Veronica Franco  Attending Physician:  Lavonda Jumbo, MD, FACEP  Diagnosis and Treatment Plan       Clinical Impression:   Final diagnoses:   Chest wall pain   Anxiety about health       Treatment Plan:   ED Disposition     ED Disposition Condition Date/Time Comment    Discharge  Thu Feb 27, 2018  1:14 PM Veronica Franco discharge to home/self care.    Condition at disposition: Stable          History of Presenting Illness     Chief Complaint   Patient presents with   . Chest Pain       History Provided By: Pt  Chief Complaint: Chest pain  Onset: Five hours ago  Timing: Constant   Location: L side  Quality:   Severity: Moderate  Modifying Factors: None  Associated sxs: None    Additional History: Veronica Franco is a 52 y.o. female with a hx of CHF and PE presents with constant L sided chest pain x five hours ago. She says that she has been having this same kind of pain daily for "months"  Denies smoking, SOB, or drug use.    PCP: Pcp, [place], MD      No current facility-administered medications for this encounter.     Current Outpatient Prescriptions:   .  albuterol (PROVENTIL HFA;VENTOLIN HFA) 108 (90 Base) MCG/ACT inhaler, Inhale 2 puffs into the lungs every 4 (four) hours as needed for Wheezing, Disp: , Rfl:   .  atorvastatin (LIPITOR) 20 MG tablet, Take 20 mg by mouth nightly, Disp: , Rfl:   .  budesonide-formoterol (SYMBICORT) 160-4.5 MCG/ACT inhaler, Inhale 2 puffs into the lungs 2 (two) times daily, Disp: , Rfl:   .  furosemide (LASIX) 80 MG tablet, Take 0.5 tablets (40 mg total) by mouth 2 (two) times daily, Disp: 60 tablet, Rfl: 0  .  metFORMIN (GLUCOPHAGE) 500 MG tablet, Take 500 mg by mouth 2 (two) times daily with meals, Disp: , Rfl:   .  Multiple Vitamins-Minerals (MULTIVITAMIN WITH MINERALS) tablet, Take 1 tablet by mouth daily, Disp: , Rfl:   .  rivaroxaban (XARELTO) 20 MG Tab, Take 20 mg by mouth daily  with dinner   , Disp: , Rfl:   .  topiramate (TOPAMAX) 100 MG tablet, Take 100 mg by mouth 2 (two) times daily, Disp: , Rfl:     Past Medical History     Past Medical History:   Diagnosis Date   . Arthritis    . Asthma    . Congestive heart failure    . Convulsions    . Sleep apnea      Past Surgical History:   Procedure Laterality Date   . CHOLECYSTECTOMY     . HERNIA REPAIR     . HYSTERECTOMY         Family History     No family history on file.    Social History     Social History     Social History   . Marital status: Divorced     Spouse name: N/A   . Number of children: N/A   . Years of education: N/A     Social History Main Topics   . Smoking status: Never Smoker   . Smokeless tobacco: Never Used   . Alcohol use No   .  Drug use: No   . Sexual activity: Not on file     Other Topics Concern   . Not on file     Social History Narrative   . No narrative on file       Allergies     Allergies   Allergen Reactions   . Codeine Hives       Review of Systems     Review of Systems   Respiratory: Negative for choking and shortness of breath.    Cardiovascular: Positive for chest pain. Negative for leg swelling.   All other systems reviewed and are negative.      Physical Exam     BP 123/58   Pulse 66   Temp 97.5 F (36.4 C) (Oral)   Resp 18   Ht 5\' 5"  (1.651 m)   Wt 146.5 kg   SpO2 97%   BMI 53.75 kg/m   Pulse Oximetry Analysis - Normal             Physical Examination: General appearance - alert, well appearing, and in no distress  Mental status - alert, oriented to person, place, and time  Eyes - pupils equal and reactive, extraocular eye movements intact  Nose - normal and patent, no discharge  Neck - grossly normal rom  Chest - clear to auscultation, no wheezes, rales or rhonchi, symmetric air entry  Heart - normal rate, regular rhythm  Abdomen - soft, nontender, nondistended  Neurological - maex4, speech clear  Musculoskeletal - no joint tenderness, deformity or swelling  Extremities - distal blood flow  grossly normal, no pedal edema  Skin - normal coloration, no rashes where visualized   Psychiatric- alert, oriented, anxious                  EKG:   Interpreted by EDP. NSR with 1st degree AVB. Nonspecific ST/Twave changes.  Monitor: NSR with 1st degree AVB.            Diagnostic Study Results     Labs -     Results     Procedure Component Value Units Date/Time    B-type Natriuretic Peptide [[id number]] Collected:  02/27/18 1103    Specimen:  Blood Updated:  02/27/18 1247     B-Natriuretic Peptide 24 pg/mL     Troponin I [[id number]] Collected:  02/27/18 1103    Specimen:  Blood Updated:  02/27/18 1135     Troponin I <0.01 ng/mL     Comprehensive metabolic panel [[id number]]  (Abnormal) Collected:  02/27/18 1103    Specimen:  Blood Updated:  02/27/18 1129     Glucose 98 mg/dL      BUN 08.6 (H) mg/dL      Creatinine 1.0 mg/dL      Sodium 578 mEq/L      Potassium 4.4 mEq/L      Chloride 103 mEq/L      CO2 27 mEq/L      Calcium 10.1 mg/dL      Protein, Total 7.9 g/dL      Albumin 3.7 g/dL      AST (SGOT) 16 U/L      ALT 12 U/L      Alkaline Phosphatase 224 (H) U/L      Bilirubin, Total 0.3 mg/dL      Globulin 4.2 (H) g/dL      Albumin/Globulin Ratio 0.9     Anion Gap 10.0    Hemolysis index [[id number]]  (Abnormal) Collected:  02/27/18 1103  Updated:  02/27/18 1129     Hemolysis Index 33 (H)    GFR [[id number]] Collected:  02/27/18 1103     Updated:  02/27/18 1129     EGFR >60.0    CBC with differential [[id number]]  (Abnormal) Collected:  02/27/18 1103    Specimen:  Blood from Blood Updated:  02/27/18 1113     WBC 8.43 x10 3/uL      Hgb 10.5 (L) g/dL      Hematocrit 64.3 (L) %      Platelets 236 x10 3/uL      RBC 4.08 x10 6/uL      MCV 84.6 fL      MCH 25.7 pg      MCHC 30.4 (L) g/dL      RDW 15 %      MPV 10.6 fL      Neutrophils 65.7 %      Lymphocytes Automated 25.9 %      Monocytes 6.3 %      Eosinophils Automated 1.5 %      Basophils Automated 0.4 %      Immature Granulocyte 0.2 %      Nucleated RBC 0.0 /100 WBC       Neutrophils Absolute 5.54 x10 3/uL      Abs Lymph Automated 2.18 x10 3/uL      Abs Mono Automated 0.53 x10 3/uL      Abs Eos Automated 0.13 x10 3/uL      Absolute Baso Automated 0.03 x10 3/uL      Absolute Immature Granulocyte 0.02 x10 3/uL      Absolute NRBC 0.00 x10 3/uL           Radiologic Studies -   Radiology Results (24 Hour)     Procedure Component Value Units Date/Time    CT Angiogram Chest [[id number]] Collected:  02/27/18 1227    Order Status:  Completed Updated:  02/27/18 1235    Narrative:       CT ANGIOGRAM CHEST 02/27/2018 12:19 PM    Clinical history: CHEST PAIN, ACUTE, PE SUSPECTED    Comparison: none available.      Technique: Axial images are obtained through the chest after  administration of intravenous contrast. Sagittal and coronal  reformations and 3-D MIPS were made.      Contrast dose: Omnipaque 350,    The following ?dose reduction techniques were utilized: automated  exposure control and/or adjustment of the mA and/or kV according  to patient size, and the use of iterative reconstruction technique.    FINDINGS:    There is prominence of the main pulmonary artery that measures 4.4 cm in  diameter, the right main pulmonary artery measures 2.7 cm in diameter  and the left main pulmonary artery measures 2.7 cm in diameter. Clinical  correlation for pulmonary hypertension is helpful.    No evidence of pulmonary emboli or aortic dissection. There is mild  cardiomegaly. There is no compromise upon the airway. Linear atelectasis  at the lung bases. There is no effusion or consolidation. The esophagus  imaging normal. Limited examination of the upper abdomen is  unremarkable. Cholecystectomy changes.    Multilevel degenerative changes in the thoracic spine.      Impression:        No evidence of pulmonary emboli or aortic dissection.    Enlargement of the main pulmonary arteries concerning for pulmonary  hypertension.    Joselyn Glassman, MD   02/27/2018 12:31 PM  Chest AP Portable [[id number]]  Collected:  02/27/18 1140    Order Status:  Completed Updated:  02/27/18 1144    Narrative:       Frontal chest x-ray    Indication: Unspecified chest pain    Comparison: X-ray 02/03/2018    Findings: Cardio mediastinal silhouette is stable. Low lung volumes  vascular crowding. No large pleural effusion.      Impression:        Low lung volumes    Lorinda Creed, MD   02/27/2018 11:40 AM      .    [facility] Notes       11:12 AM - Pt agreeable to ED plan, including having lab work performed.     1:14 PM - Discussed all results with pt and counseled on diagnosis, f/u plans, medication use, and signs and symptoms when to return to ED. Will give Cardiology referral. Pt is stable and ready for discharge.       Old Records:   Hospitalized last month for syncope. Nuclear study on 02/07/2018 was neg.       Attestations:     Physician/Midlevel provider first contact with patient: 02/27/18 1100         This note is prepared for Lavonda Jumbo, MD, FACEP. The scribe's documentation has been prepared under my direction and personally reviewed by me in its entirety.  I confirm that the note above accurately reflects all work, treatment, procedures, and medical decision making performed by me.     I am the first provider for this patient.      Lavonda Jumbo, MD, FACEP is the primary emergency doctor of record.      I reviewed the available vital signs, nursing notes, past medical history, past surgical history, family history and social history at the time of initial evaluation.    _______________________________             Donny Pique, MD  02/27/18 1550

## 2018-02-27 NOTE — ED Notes (Signed)
Bed: GR10  Expected date: 02/27/18  Expected time: 10:52 AM  Means of arrival: [facility] [place]  Comments:  [facility]

## 2018-02-27 NOTE — ED Triage Notes (Signed)
Veronica Franco is a 52 y.o. female BIBA from an [place] clinic due to a constant chest pain that started at 0600 this morning. Per EMS, pt. Received 2 baby ASA at the clinic. Pt. Denies any N/V/D. A & O x 4.

## 2018-02-28 ENCOUNTER — Ambulatory Visit (INDEPENDENT_AMBULATORY_CARE_PROVIDER_SITE_OTHER): Payer: Medicare Other | Admitting: Nurse Practitioner

## 2018-02-28 LAB — ECG 12-LEAD
Atrial Rate: 62 {beats}/min
Atrial Rate: 65 {beats}/min
P Axis: 21 degrees
P Axis: 50 degrees
P-R Interval: 194 ms
P-R Interval: 222 ms
Q-T Interval: 402 ms
Q-T Interval: 396 ms
QRS Duration: 86 ms
QRS Duration: 88 ms
QTC Calculation (Bezet): 408 ms
QTC Calculation (Bezet): 411 ms
R Axis: 53 degrees
R Axis: 50 degrees
T Axis: 38 degrees
T Axis: 40 degrees
Ventricular Rate: 62 {beats}/min
Ventricular Rate: 65 {beats}/min

## 2018-04-29 NOTE — Progress Notes (Signed)
Chief Complaint   Patient presents with   . Chest Pain       History of Present Illness:     This patient is a 52 y.o. female here for f/u. C/o chest pain radiating to L arm. +nausea.  Given 324 mg ASA  911 called and patient was transported to the hospital.    Past Medical History:   Diagnosis Date   . Arthritis    . Asthma    . Congestive heart failure    . Convulsions    . Sleep apnea        Past Surgical History:   Procedure Laterality Date   . CHOLECYSTECTOMY     . HERNIA REPAIR     . HYSTERECTOMY             Social History     Social History   . Marital status: Divorced     Spouse name: N/A   . Number of children: N/A   . Years of education: N/A     Occupational History   . Not on file.     Social History Main Topics   . Smoking status: Never Smoker   . Smokeless tobacco: Never Used   . Alcohol use No   . Drug use: No   . Sexual activity: Not on file     Other Topics Concern   . Not on file     Social History Narrative   . No narrative on file       Allergies   Allergen Reactions   . Codeine Hives         Current Outpatient Prescriptions:   .  albuterol (PROVENTIL HFA;VENTOLIN HFA) 108 (90 Base) MCG/ACT inhaler, Inhale 2 puffs into the lungs every 4 (four) hours as needed for Wheezing, Disp: , Rfl:   .  atorvastatin (LIPITOR) 20 MG tablet, Take 20 mg by mouth nightly, Disp: , Rfl:   .  budesonide-formoterol (SYMBICORT) 160-4.5 MCG/ACT inhaler, Inhale 2 puffs into the lungs 2 (two) times daily, Disp: , Rfl:   .  furosemide (LASIX) 80 MG tablet, Take 0.5 tablets (40 mg total) by mouth 2 (two) times daily, Disp: 60 tablet, Rfl: 0  .  metFORMIN (GLUCOPHAGE) 500 MG tablet, Take 500 mg by mouth 2 (two) times daily with meals, Disp: , Rfl:   .  Multiple Vitamins-Minerals (MULTIVITAMIN WITH MINERALS) tablet, Take 1 tablet by mouth daily, Disp: , Rfl:   .  rivaroxaban (XARELTO) 20 MG Tab, Take 20 mg by mouth daily with dinner   , Disp: , Rfl:   .  topiramate (TOPAMAX) 100 MG tablet, Take 100 mg by mouth 2 (two)  times daily, Disp: , Rfl:     Review of Systems   Constitutional: Positive for diaphoresis.   Respiratory: Positive for shortness of breath. Negative for cough.    Cardiovascular: Positive for chest pain.   Gastrointestinal: Positive for nausea. Negative for vomiting.       All systems reviewed and negative except as described above.    Physical Exam:     Vitals:    02/27/18 0915   BP: 119/70   Pulse: 66   Resp: 16   Temp: 98.1 F (36.7 C)   SpO2: 99%     Wt Readings from Last 3 Encounters:   02/27/18 146.5 kg (323 lb)   02/27/18 146.5 kg (323 lb)   02/14/18 145.6 kg (321 lb)       General: awake, alert, oriented  x 3  HEENT: perrla, eomi, sclera anicteric,oropharynx clear without lesions, mucous membranes moist  Neck: supple, no lymphadenopathy, no thyromegaly, no JVD, no carotid bruits  Cardiovascular: S1, S2, regular rate and rhythm, no murmurs, rubs, or gallops  Lungs: clear to auscultation bilaterally, without wheezing, rhonchi, or rales  Abdomen: soft, non tender, normal bowel sounds  Extremities: no clubbing, cyanosis, or edema  Skin: no rashes or lesions noted      Diagnostics:     Lab Results   Component Value Date    WBC 8.43 02/27/2018    HGB 10.5 (L) 02/27/2018    HCT 34.5 (L) 02/27/2018    PLT 236 02/27/2018    CHOL 185 02/14/2018    TRIG 176 (H) 02/14/2018    HDL 32 (L) 02/14/2018    LDL 118 (H) 02/14/2018    ALT 12 02/27/2018    AST 16 02/27/2018    NA 140 02/27/2018    K 4.4 02/27/2018    CL 103 02/27/2018    CREAT 1.0 02/27/2018    BUN 25.0 (H) 02/27/2018    CO2 27 02/27/2018    GLU 98 02/27/2018    HGBA1C 5.7 02/04/2018       No results found.    Active Problems:     Patient Active Problem List   Diagnosis   . Syncope and collapse   . Seizures   . Moderate persistent asthma without complication   . OSA on CPAP   . PE (pulmonary thromboembolism)       Assessment/Plan:     52 y.o. female     There are no diagnoses linked to this encounter.    Follow up: upon discharge  Discharged from [facility]  Clinic?: yes  Medical Home Referred to/Referred Back to: IMG     Med Rec Complete: yes  Referred to [facility] for diabetes A1c > 8.5: na  Given commitment to care documents when appropriate: na   Teach back for HF, COPD, DM, AMI, PNA: na

## 2019-01-16 DIAGNOSIS — I5032 Chronic diastolic (congestive) heart failure: Secondary | ICD-10-CM

## 2019-01-16 DIAGNOSIS — I509 Heart failure, unspecified: Secondary | ICD-10-CM

## 2019-01-16 DIAGNOSIS — I5030 Unspecified diastolic (congestive) heart failure: Secondary | ICD-10-CM

## 2019-01-16 HISTORY — DX: Heart failure, unspecified: I50.9

## 2019-01-16 HISTORY — DX: Chronic diastolic (congestive) heart failure: I50.32

## 2019-01-16 HISTORY — DX: Unspecified diastolic (congestive) heart failure: I50.30

## 2019-02-16 DIAGNOSIS — I4891 Unspecified atrial fibrillation: Secondary | ICD-10-CM

## 2019-02-16 HISTORY — DX: Unspecified atrial fibrillation: I48.91

## 2019-04-17 DIAGNOSIS — E119 Type 2 diabetes mellitus without complications: Secondary | ICD-10-CM | POA: Insufficient documentation

## 2019-04-17 DIAGNOSIS — Z86711 Personal history of pulmonary embolism: Secondary | ICD-10-CM | POA: Insufficient documentation

## 2019-04-17 DIAGNOSIS — I4891 Unspecified atrial fibrillation: Secondary | ICD-10-CM | POA: Insufficient documentation

## 2019-05-11 ENCOUNTER — Other Ambulatory Visit: Payer: Self-pay | Admitting: Gastroenterology

## 2019-06-01 DIAGNOSIS — M1710 Unilateral primary osteoarthritis, unspecified knee: Secondary | ICD-10-CM | POA: Insufficient documentation

## 2019-06-01 DIAGNOSIS — E66813 Obesity, class 3: Secondary | ICD-10-CM | POA: Insufficient documentation

## 2019-06-01 DIAGNOSIS — Z6841 Body Mass Index (BMI) 40.0 and over, adult: Secondary | ICD-10-CM | POA: Insufficient documentation

## 2019-06-02 ENCOUNTER — Encounter (HOSPITAL_COMMUNITY): Payer: Self-pay | Admitting: *Deleted

## 2019-06-02 ENCOUNTER — Other Ambulatory Visit: Payer: Self-pay

## 2019-06-02 ENCOUNTER — Other Ambulatory Visit (HOSPITAL_COMMUNITY)
Admission: RE | Admit: 2019-06-02 | Discharge: 2019-06-02 | Disposition: A | Discharge status: Home / Self Care | Payer: Medicare Other | Source: Ambulatory Visit | Attending: Gastroenterology | Admitting: Gastroenterology

## 2019-06-02 DIAGNOSIS — Z20828 Contact with and (suspected) exposure to other viral communicable diseases: Secondary | ICD-10-CM | POA: Diagnosis not present

## 2019-06-02 DIAGNOSIS — Z01812 Encounter for preprocedural laboratory examination: Secondary | ICD-10-CM | POA: Insufficient documentation

## 2019-06-03 ENCOUNTER — Encounter (HOSPITAL_COMMUNITY): Payer: Self-pay | Admitting: *Deleted

## 2019-06-03 LAB — NOVEL CORONAVIRUS, NAA (HOSP ORDER, SEND-OUT TO REF LAB; TAT 18-24 HRS): SARS-CoV-2, NAA: NOT DETECTED

## 2019-06-05 ENCOUNTER — Ambulatory Visit (HOSPITAL_COMMUNITY)
Admission: RE | Admit: 2019-06-05 | Discharge: 2019-06-05 | Disposition: A | Discharge status: Home / Self Care | Payer: Medicare Other | Attending: Gastroenterology | Admitting: Gastroenterology

## 2019-06-05 ENCOUNTER — Other Ambulatory Visit: Payer: Self-pay

## 2019-06-05 ENCOUNTER — Ambulatory Visit (HOSPITAL_COMMUNITY): Payer: Medicare Other

## 2019-06-05 ENCOUNTER — Encounter (HOSPITAL_COMMUNITY): Payer: Self-pay | Admitting: Anesthesiology

## 2019-06-05 ENCOUNTER — Encounter (HOSPITAL_COMMUNITY)
Admission: RE | Disposition: A | Discharge status: Home / Self Care | Payer: Self-pay | Source: Home / Self Care | Attending: Gastroenterology

## 2019-06-05 DIAGNOSIS — I509 Heart failure, unspecified: Secondary | ICD-10-CM | POA: Insufficient documentation

## 2019-06-05 DIAGNOSIS — D120 Benign neoplasm of cecum: Secondary | ICD-10-CM | POA: Diagnosis not present

## 2019-06-05 DIAGNOSIS — Z885 Allergy status to narcotic agent status: Secondary | ICD-10-CM | POA: Diagnosis not present

## 2019-06-05 DIAGNOSIS — I69354 Hemiplegia and hemiparesis following cerebral infarction affecting left non-dominant side: Secondary | ICD-10-CM | POA: Insufficient documentation

## 2019-06-05 DIAGNOSIS — I4891 Unspecified atrial fibrillation: Secondary | ICD-10-CM | POA: Diagnosis not present

## 2019-06-05 DIAGNOSIS — Z6841 Body Mass Index (BMI) 40.0 and over, adult: Secondary | ICD-10-CM | POA: Insufficient documentation

## 2019-06-05 DIAGNOSIS — K5730 Diverticulosis of large intestine without perforation or abscess without bleeding: Secondary | ICD-10-CM | POA: Insufficient documentation

## 2019-06-05 DIAGNOSIS — M1712 Unilateral primary osteoarthritis, left knee: Secondary | ICD-10-CM | POA: Diagnosis not present

## 2019-06-05 DIAGNOSIS — M1990 Unspecified osteoarthritis, unspecified site: Secondary | ICD-10-CM | POA: Insufficient documentation

## 2019-06-05 DIAGNOSIS — G4730 Sleep apnea, unspecified: Secondary | ICD-10-CM | POA: Insufficient documentation

## 2019-06-05 DIAGNOSIS — Z886 Allergy status to analgesic agent status: Secondary | ICD-10-CM | POA: Diagnosis not present

## 2019-06-05 DIAGNOSIS — E119 Type 2 diabetes mellitus without complications: Secondary | ICD-10-CM | POA: Insufficient documentation

## 2019-06-05 DIAGNOSIS — Z7901 Long term (current) use of anticoagulants: Secondary | ICD-10-CM | POA: Diagnosis not present

## 2019-06-05 DIAGNOSIS — Z86711 Personal history of pulmonary embolism: Secondary | ICD-10-CM | POA: Diagnosis not present

## 2019-06-05 DIAGNOSIS — J45909 Unspecified asthma, uncomplicated: Secondary | ICD-10-CM | POA: Insufficient documentation

## 2019-06-05 DIAGNOSIS — Z1211 Encounter for screening for malignant neoplasm of colon: Secondary | ICD-10-CM | POA: Insufficient documentation

## 2019-06-05 HISTORY — DX: Personal history of pulmonary embolism: Z86.711

## 2019-06-05 HISTORY — DX: Cardiac arrhythmia, unspecified: I49.9

## 2019-06-05 HISTORY — DX: Unspecified macular degeneration: H35.30

## 2019-06-05 HISTORY — DX: Unspecified asthma, uncomplicated: J45.909

## 2019-06-05 HISTORY — DX: Sleep apnea, unspecified: G47.30

## 2019-06-05 HISTORY — PX: POLYPECTOMY: SHX5525

## 2019-06-05 HISTORY — PX: COLONOSCOPY WITH PROPOFOL: SHX5780

## 2019-06-05 HISTORY — DX: Cerebral infarction, unspecified: I63.9

## 2019-06-05 HISTORY — DX: Unspecified osteoarthritis, unspecified site: M19.90

## 2019-06-05 HISTORY — DX: Pneumonia, unspecified organism: J18.9

## 2019-06-05 LAB — GLUCOSE, CAPILLARY: Glucose-Capillary: 85 mg/dL (ref 70–99)

## 2019-06-05 SURGERY — COLONOSCOPY WITH PROPOFOL
Anesthesia: Monitor Anesthesia Care

## 2019-06-05 MED ORDER — LACTATED RINGERS IV SOLN
INTRAVENOUS | Status: DC
  Administered 2019-06-05: 1000 mL via INTRAVENOUS

## 2019-06-05 MED ORDER — PROPOFOL 10 MG/ML IV BOLUS
INTRAVENOUS | Status: AC
  Filled 2019-06-05: qty 20

## 2019-06-05 MED ORDER — FENTANYL CITRATE (PF) 100 MCG/2ML IJ SOLN
INTRAMUSCULAR | Status: DC | PRN
  Administered 2019-06-05: 25 ug via INTRAVENOUS

## 2019-06-05 MED ORDER — FENTANYL CITRATE (PF) 100 MCG/2ML IJ SOLN
INTRAMUSCULAR | Status: AC
  Filled 2019-06-05: qty 2

## 2019-06-05 MED ORDER — LIDOCAINE 2% (20 MG/ML) 5 ML SYRINGE
INTRAMUSCULAR | Status: DC | PRN
  Administered 2019-06-05: 100 mg via INTRAVENOUS

## 2019-06-05 MED ORDER — SODIUM CHLORIDE 0.9 % IV SOLN: INTRAVENOUS | Status: DC

## 2019-06-05 MED ORDER — PROPOFOL 500 MG/50ML IV EMUL
INTRAVENOUS | Status: DC | PRN
  Administered 2019-06-05: 125 ug/kg/min via INTRAVENOUS

## 2019-06-05 MED ORDER — PROPOFOL 10 MG/ML IV BOLUS
INTRAVENOUS | Status: DC | PRN
  Administered 2019-06-05 (×7): 20 mg via INTRAVENOUS

## 2019-06-05 MED ORDER — PROPOFOL 10 MG/ML IV BOLUS
INTRAVENOUS | Status: AC
  Filled 2019-06-05: qty 40

## 2019-06-05 SURGICAL SUPPLY — 21 items

## 2019-06-05 NOTE — H&P (Signed)
  [name] HPI: At this time the patient denies any problems with nausea, vomiting, fevers, chills, abdominal pain, diarrhea, constipation, hematochezia, melena, GERD, or dysphagia. The patient denies any known family history of colon cancers. No complaints of chest pain, SOB, MI, or sleep apnea. She has a history of CHF and her last exacerbation was in 2016. She takes Xarelto for atrial fibrillation.   Past Medical History:  Diagnosis Date  . Arthritis    left knee  . Asthma   . CHF (congestive heart failure) ([place]) 01/2019   Physician from [place] informed her she had this   . Dysrhythmia    Atrial fibrillation  . History of pulmonary embolus (PE)   . Macular degeneration    of left eye  . Pneumonia    in the past  . Sleep apnea     Past Surgical History:  Procedure Laterality Date  . ABDOMINAL HYSTERECTOMY  2003   complete  . CARDIAC CATHETERIZATION  2017   in [facility] in [place]  . CESAREAN SECTION  [id number]   x 2  . CESAREAN SECTION     x 2  . CHOLECYSTECTOMY  2003  . HERNIA REPAIR  [id number], [id number]   umbilical hernia repair  . IVC FILTER INSERTION  2003   Hx pulmonary embolus  . SHOULDER ARTHROSCOPY W/ ROTATOR CUFF REPAIR  08/28/2017   right shoulder    History reviewed. No pertinent family history.  Social History:  reports that she has never smoked. She has never used smokeless tobacco. She reports that she does not drink alcohol or use drugs.  Allergies:  Allergies  Allergen Reactions  . Food     Tomatoes-rash Coconuts-rash  . Acetaminophen-Codeine Swelling and Rash    Tylenol with Codeine, Tylenol #3,  (facial swelling)  . Coconut Oil Rash  . Tomato Rash    Medications:  Scheduled:  Continuous: . sodium chloride      No results found for this or any previous visit (from the past 24 hour(s)).   No results found.  ROS:  As stated above in the HPI otherwise negative.  Blood pressure (!) 148/81, pulse 64, temperature 97.8 F (36.6 C),  temperature source Oral, resp. rate (!) 26, height 5\' 5"  ([id number] m), weight (!) 137.4 kg, SpO2 100 %.    PE: Gen: NAD, Alert and Oriented HEENT:  [place]/AT, EOMI Neck: Supple, no LAD Lungs: CTA Bilaterally CV: RRR without M/G/R ABM: Soft, NTND, +BS Ext: No C/C/E  Assessment/Plan: 1) Screening colonoscopy.  Meghan [name] D 06/05/2019, 10:17 AM

## 2019-06-05 NOTE — Discharge Instructions (Signed)

## 2019-06-05 NOTE — Anesthesia Preprocedure Evaluation (Addendum)
Anesthesia Evaluation  Patient identified by MRN, date of birth, ID band Patient awake    Reviewed: Allergy & Precautions, NPO status , Patient's Chart, lab work & pertinent test results  History of Anesthesia Complications Negative for: history of anesthetic complications  Airway Mallampati: III  TM Distance: >3 FB Neck ROM: Full    Dental  (+) Dental Advisory Given, Chipped, Missing,    Pulmonary asthma , sleep apnea and Continuous Positive Airway Pressure Ventilation , neg recent URI,    breath sounds clear to auscultation       Cardiovascular +CHF  + dysrhythmias  Rhythm:Regular     Neuro/Psych Seizures -,  Left weakness CVA, Residual Symptoms negative psych ROS   GI/Hepatic negative GI ROS, Neg liver ROS,   Endo/Other  diabetesMorbid obesity  Renal/GU negative Renal ROS     Musculoskeletal  (+) Arthritis ,   Abdominal   Peds  Hematology xarelto    Anesthesia Other Findings   Reproductive/Obstetrics                            Anesthesia Physical Anesthesia Plan  ASA: IV  Anesthesia Plan: MAC   Post-op Pain Management:    Induction: Intravenous  PONV Risk Score and Plan: 2 and Treatment may vary due to age or medical condition and Propofol infusion  Airway Management Planned: Nasal Cannula  Additional Equipment: None  Intra-op Plan:   Post-operative Plan:   Informed Consent: I have reviewed the patients History and Physical, chart, labs and discussed the procedure including the risks, benefits and alternatives for the proposed anesthesia with the patient or authorized representative who has indicated his/her understanding and acceptance.     Dental advisory given  Plan Discussed with: CRNA and Surgeon  Anesthesia Plan Comments:        Anesthesia Quick Evaluation

## 2019-06-05 NOTE — Op Note (Addendum)
[facility] Patient Name: Meghan [name] Procedure Date: 06/05/2019 MRN: [id number] Attending MD: Carol Ada , MD Date of Birth: 1966/01/30 CSN: [id number] Age: 53 Admit Type: Outpatient Procedure:                Colonoscopy Indications:              Screening for colorectal malignant neoplasm Providers:                Carol Ada, MD, [name] Daub, RN, Truddie Coco, RN Referring MD:              Medicines:                 Complications:            No immediate complications. Estimated Blood Loss:     Estimated blood loss: none. Procedure:                Pre-Anesthesia Assessment:                           - Prior to the procedure, a History and Physical                            was performed, and patient medications and                            allergies were reviewed. The patient's tolerance of                            previous anesthesia was also reviewed. The risks                            and benefits of the procedure and the sedation                            options and risks were discussed with the patient.                            All questions were answered, and informed consent                            was obtained. Prior Anticoagulants: The patient has                            taken no previous anticoagulant or antiplatelet                            agents. ASA Grade Assessment: III - A patient with                            severe systemic disease. After reviewing the risks                            and benefits, the patient was deemed in  satisfactory condition to undergo the procedure.                           - Sedation was administered by an anesthesia                            professional. Deep sedation was attained.                           After obtaining informed consent, the colonoscope                            was passed under direct vision. Throughout the                            procedure, the  patient's blood pressure, pulse, and                            oxygen saturations were monitored continuously. The                            [id number]) Olympus pediatric colonscope                            was introduced through the anus and advanced to the                            the cecum, identified by appendiceal orifice and                            ileocecal valve. The colonoscopy was somewhat                            difficult due to significant looping. Successful                            completion of the procedure was aided by                            straightening and shortening the scope to obtain                            bowel loop reduction. The patient tolerated the                            procedure well. The quality of the bowel                            preparation was excellent. The ileocecal valve,                            appendiceal orifice, and rectum were photographed. Scope In: 10:46:23 AM Scope Out: 11:14:00 AM Scope Withdrawal Time: 0 hours 18 minutes 54 seconds  Total Procedure Duration: 0 hours 27 minutes  37 seconds  Findings:      A 2 mm polyp was found in the cecum. The polyp was sessile. The polyp       was removed with a cold snare. Resection and retrieval were complete.      Scattered small and large-mouthed diverticula were found in the sigmoid       colon and descending colon. Impression:               - One 2 mm polyp in the cecum, removed with a cold                            snare. Resected and retrieved.                           - Diverticulosis in the sigmoid colon and in the                            descending colon. Moderate Sedation:      Not Applicable - Patient had care per Anesthesia. Recommendation:           - Patient has a contact number available for                            emergencies. The signs and symptoms of potential                            delayed complications were discussed with the                             patient. Return to normal activities tomorrow.                            Written discharge instructions were provided to the                            patient.                           - Resume previous diet.                           - Continue present medications.                           - Await pathology results.                           - Repeat colonoscopy in 7 years for surveillance. Procedure Code(s):        --- Professional ---                           [phone], Colonoscopy, flexible; with removal of                            tumor(s), polyp(s), or other lesion(s) by snare  technique Diagnosis Code(s):        --- Professional ---                           Z12.11, Encounter for screening for malignant                            neoplasm of colon                           K63.5, Polyp of colon                           K57.30, Diverticulosis of large intestine without                            perforation or abscess without bleeding CPT copyright 2019 [employer]. All rights reserved. The codes documented in this report are preliminary and upon coder review may  be revised to meet current compliance requirements. Carol Ada, MD Carol Ada, MD 06/05/2019 11:22:08 AM This report has been signed electronically. Number of Addenda: 0

## 2019-06-05 NOTE — Anesthesia Procedure Notes (Signed)
Date/Time: 06/05/2019 10:36 AM Performed by: Sharlette Dense, CRNA Oxygen Delivery Method: Simple face mask

## 2019-06-05 NOTE — Transfer of Care (Signed)
Immediate Anesthesia Transfer of Care Note  Patient: Meghan [name]  Procedure(s) Performed: COLONOSCOPY WITH PROPOFOL (N/A ) POLYPECTOMY  Patient Location: Endoscopy Unit  Anesthesia Type:MAC  Level of Consciousness: awake  Airway & Oxygen Therapy: Patient Spontanous Breathing and Patient connected to face mask oxygen  Post-op Assessment: Report given to RN and Post -op Vital signs reviewed and stable  Post vital signs: Reviewed and stable  Last Vitals:  Vitals Value Taken Time  BP    Temp    Pulse 65 06/05/19 1119  Resp 11 06/05/19 1119  SpO2 98 % 06/05/19 1119  Vitals shown include unvalidated device data.  Last Pain:  Vitals:   06/05/19 0957  TempSrc: Oral  PainSc: 0-No pain         Complications: No apparent anesthesia complications

## 2019-06-08 LAB — SURGICAL PATHOLOGY

## 2019-06-08 NOTE — Anesthesia Postprocedure Evaluation (Signed)
Anesthesia Post Note  Patient: Meghan [name]  Procedure(s) Performed: COLONOSCOPY WITH PROPOFOL (N/A ) POLYPECTOMY     Patient location during evaluation: Endoscopy Anesthesia Type: MAC Level of consciousness: awake and alert Pain management: pain level controlled Vital Signs Assessment: post-procedure vital signs reviewed and stable Respiratory status: spontaneous breathing, nonlabored ventilation, respiratory function stable and patient connected to nasal cannula oxygen Cardiovascular status: stable and blood pressure returned to baseline Postop Assessment: no apparent nausea or vomiting Anesthetic complications: no    Last Vitals:  Vitals:   06/05/19 1119 06/05/19 1130  BP: 131/62 (!) 155/74  Pulse: 61 (!) 59  Resp: 16 (!) 25  Temp:    SpO2: 100% 97%    Last Pain:  Vitals:   06/05/19 1119  TempSrc: Temporal  PainSc: 0-No pain                 Deunte Bledsoe

## 2019-07-10 DIAGNOSIS — S83241A Other tear of medial meniscus, current injury, right knee, initial encounter: Secondary | ICD-10-CM | POA: Insufficient documentation

## 2019-08-31 ENCOUNTER — Other Ambulatory Visit: Payer: Self-pay

## 2019-08-31 ENCOUNTER — Ambulatory Visit (INDEPENDENT_AMBULATORY_CARE_PROVIDER_SITE_OTHER): Payer: Medicare Other | Admitting: Licensed Clinical Social Worker

## 2019-08-31 DIAGNOSIS — F4310 Post-traumatic stress disorder, unspecified: Secondary | ICD-10-CM | POA: Diagnosis not present

## 2019-08-31 DIAGNOSIS — F331 Major depressive disorder, recurrent, moderate: Secondary | ICD-10-CM | POA: Diagnosis not present
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
## 2019-09-03 ENCOUNTER — Emergency Department (HOSPITAL_COMMUNITY): Payer: Medicare Other

## 2019-09-03 ENCOUNTER — Emergency Department (HOSPITAL_COMMUNITY)
Admission: EM | Admit: 2019-09-03 | Discharge: 2019-09-03 | Disposition: A | Discharge status: Home / Self Care | Payer: Medicare Other | Attending: Emergency Medicine | Admitting: Emergency Medicine

## 2019-09-03 ENCOUNTER — Encounter (HOSPITAL_COMMUNITY): Payer: Self-pay | Admitting: Emergency Medicine

## 2019-09-03 DIAGNOSIS — I4891 Unspecified atrial fibrillation: Secondary | ICD-10-CM | POA: Diagnosis not present

## 2019-09-03 DIAGNOSIS — Z86711 Personal history of pulmonary embolism: Secondary | ICD-10-CM | POA: Insufficient documentation

## 2019-09-03 DIAGNOSIS — I509 Heart failure, unspecified: Secondary | ICD-10-CM | POA: Insufficient documentation

## 2019-09-03 DIAGNOSIS — R635 Abnormal weight gain: Secondary | ICD-10-CM | POA: Diagnosis not present

## 2019-09-03 DIAGNOSIS — Z79899 Other long term (current) drug therapy: Secondary | ICD-10-CM | POA: Diagnosis not present

## 2019-09-03 DIAGNOSIS — R000 Tachycardia, unspecified: Secondary | ICD-10-CM | POA: Diagnosis present

## 2019-09-03 DIAGNOSIS — Z7901 Long term (current) use of anticoagulants: Secondary | ICD-10-CM | POA: Diagnosis not present

## 2019-09-03 LAB — BASIC METABOLIC PANEL
Anion gap: 12 (ref 5–15)
BUN: 20 mg/dL (ref 6–20)
CO2: 24 mmol/L (ref 22–32)
Calcium: 10.1 mg/dL (ref 8.9–10.3)
Chloride: 103 mmol/L (ref 98–111)
Creatinine, Ser: 0.95 mg/dL (ref 0.44–1.00)
GFR calc Af Amer: >60 mL/min (ref >60)
GFR calc non Af Amer: >60 mL/min (ref >60)
Glucose, Bld: 113 mg/dL — ABNORMAL HIGH (ref 70–99)
Potassium: 4.1 mmol/L (ref 3.5–5.1)
Sodium: 139 mmol/L (ref 135–145)

## 2019-09-03 LAB — CBC
HCT: 40.1 % (ref 36.0–46.0)
Hemoglobin: 12.9 g/dL (ref 12.0–15.0)
MCH: 26.8 pg (ref 26.0–34.0)
MCHC: 32.2 g/dL (ref 30.0–36.0)
MCV: 83.4 fL (ref 80.0–100.0)
Platelets: 312 10*3/uL (ref 150–400)
RBC: 4.81 MIL/uL (ref 3.87–5.11)
RDW: 14.5 % (ref 11.5–15.5)
WBC: 8.5 10*3/uL (ref 4.0–10.5)
nRBC: 0 % (ref 0.0–0.2)

## 2019-09-03 LAB — BRAIN NATRIURETIC PEPTIDE: B Natriuretic Peptide: 131 pg/mL — ABNORMAL HIGH (ref 0.0–100.0)

## 2019-09-03 LAB — TROPONIN I (HIGH SENSITIVITY)
Troponin I (High Sensitivity): 4 ng/L (ref <18)
Troponin I (High Sensitivity): 5 ng/L (ref <18)

## 2019-09-03 LAB — I-STAT BETA HCG BLOOD, ED (MC, WL, AP ONLY): I-stat hCG, quantitative: <5 m[IU]/mL (ref <5)

## 2019-09-03 MED ORDER — SODIUM CHLORIDE 0.9% FLUSH: 3.0000 mL | Freq: Once | INTRAVENOUS | Status: DC

## 2019-09-03 MED ORDER — FENTANYL CITRATE (PF) 100 MCG/2ML IJ SOLN
50.0000 ug | Freq: Once | INTRAMUSCULAR | Status: AC
  Administered 2019-09-03: 50 ug via INTRAVENOUS
  Filled 2019-09-03: qty 2

## 2019-09-03 MED ORDER — FUROSEMIDE 10 MG/ML IJ SOLN
40.0000 mg | Freq: Once | INTRAMUSCULAR | Status: AC
  Administered 2019-09-03: 40 mg via INTRAVENOUS
  Filled 2019-09-03: qty 4

## 2019-09-03 NOTE — ED Notes (Signed)
Pt requested to get out of bed. Pt stated not feeling faint any longer. Pt ambulated to restroom with no assistance.

## 2019-09-03 NOTE — ED Triage Notes (Signed)
Pt to ER for evaluation of palpitations and tachycardia, history of a fib, is currently in a flutter per EMS. She is reporting left chest pain with radiation to left arm. On cardizem for a fib and xarelto and denies any missed doses. HR 110 at this time. She is in no distress on arrival with EMS. 18 gauge to left LAC.

## 2019-09-03 NOTE — ED Provider Notes (Signed)
[facility] EMERGENCY DEPARTMENT Provider Note   CSN: [id number] Arrival date & time: 09/03/19  1415     History Chief Complaint  Patient presents with  . Tachycardia    Meghan [name] is a 53 y.o. female.  The history is provided by the patient and medical records. No language interpreter was used.       53 year old female with history of atrial fibrillation currently on Xarelto, asthma, CHF, prior PE and stroke brought here via EMS from home for evaluation of heart palpitation.  Patient reported she got up this morning, felt fine, had breakfast until she developed sensation of heart racing along with achiness to her left chest which scares her.  Her chest discomfort has been waxing waning however the heart palpitation has been persistent throughout the day.  She described the pain in her chest as an achy sensation, mild in severity without any lightheadedness, dizziness, nausea, shortness of breath, abdominal pain or back pain.  She also report noticing approximately 6 pounds of increased weight gain in the past 4 days with increased welling to her legs bilaterally.  States that she is currently on Lasix 40 mg once daily.  She also mention that she has been taking his Xarelto as prescribed without missing any single dose.  She has history of left-sided weakness secondary to prior stroke and denies any increasing weakness.    Past Medical History:  Diagnosis Date  . Arthritis    left knee  . Asthma   . CHF (congestive heart failure) ([place]) 01/2019   Physician from [place] informed her she had this   . Dysrhythmia    Atrial fibrillation  . History of pulmonary embolus (PE)   . Macular degeneration    of left eye  . Pneumonia    in the past  . Seizure disorder ([place]) 2017  . Sleep apnea   . Stroke [facility])    1989 and 1995 (left sided weakness)    There are no problems to display for this patient.   Past Surgical History:  Procedure Laterality Date  . ABDOMINAL  HYSTERECTOMY  2003   complete  . CARDIAC CATHETERIZATION  2017   in [facility] in [place]  . CESAREAN SECTION  [id number]   x 2  . CESAREAN SECTION     x 2  . CHOLECYSTECTOMY  2003  . COLONOSCOPY WITH PROPOFOL N/A 06/05/2019   Procedure: COLONOSCOPY WITH PROPOFOL;  Surgeon: Carol Ada, MD;  Location: [facility] ENDOSCOPY;  Service: Endoscopy;  Laterality: N/A;  . HERNIA REPAIR  [id number], [id number]   umbilical hernia repair  . IVC FILTER INSERTION  2003   Hx pulmonary embolus  . POLYPECTOMY  06/05/2019   Procedure: POLYPECTOMY;  Surgeon: Carol Ada, MD;  Location: [facility] ENDOSCOPY;  Service: Endoscopy;;  . SHOULDER ARTHROSCOPY W/ ROTATOR CUFF REPAIR  08/28/2017   right shoulder     OB History   No obstetric history on file.     No family history on file.  Social History   Tobacco Use  . Smoking status: Never Smoker  . Smokeless tobacco: Never Used  Substance Use Topics  . Alcohol use: Never  . Drug use: Never    Home Medications Prior to Admission medications   Medication Sig Start Date End Date Taking? Authorizing Provider  budesonide-formoterol (SYMBICORT) 160-4.5 MCG/ACT inhaler Inhale 2 puffs into the lungs 2 (two) times daily.    [provider]  diltiazem (DILT-XR) 120 MG 24 hr capsule Take 120  mg by mouth daily.    [provider]  furosemide (LASIX) 40 MG tablet Take 40 mg by mouth daily. In the morning    [provider]  metFORMIN (GLUCOPHAGE) 500 MG tablet Take 250 mg by mouth daily.    [provider]  Multiple Vitamins-Minerals (PRESERVISION AREDS 2 PO) Take 2 tablets by mouth daily. softgels    [provider]  rivaroxaban (XARELTO) 20 MG TABS tablet Take 20 mg by mouth every evening.    [provider]  tiZANidine (ZANAFLEX) 4 MG tablet Take 4 mg by mouth every 8 (eight) hours as needed for muscle spasms.    [provider]  vitamin B-12 (CYANOCOBALAMIN) 1000 MCG tablet Take 1,000 mcg by mouth daily.     [provider]    Allergies    Food, Acetaminophen-codeine, Coconut oil, and Tomato  Review of Systems   Review of Systems  All other systems reviewed and are negative.   Physical Exam Updated Vital Signs BP (!) 106/42 (BP Location: Right Arm)   Pulse (!) 112   Temp 98.8 F (37.1 C) (Oral)   Resp 20   SpO2 99%   Physical Exam Vitals and nursing note reviewed.  Constitutional:      General: She is not in acute distress.    Appearance: She is well-developed. She is obese.  HENT:     Head: Atraumatic.  Eyes:     Conjunctiva/sclera: Conjunctivae normal.  Cardiovascular:     Rate and Rhythm: Tachycardia present. Rhythm irregular.     Pulses: Normal pulses.     Heart sounds: Normal heart sounds.  Pulmonary:     Breath sounds: Normal breath sounds. No wheezing, rhonchi or rales.  Abdominal:     Palpations: Abdomen is soft.     Tenderness: There is no abdominal tenderness.  Musculoskeletal:        General: Swelling (Trace edema to bilateral lower extremities) present.     Cervical back: Neck supple.  Skin:    Capillary Refill: Capillary refill takes less than 2 seconds.     Findings: No rash.  Neurological:     Mental Status: She is alert and oriented to person, place, and time.  Psychiatric:        Mood and Affect: Mood normal.     ED Results / Procedures / Treatments   Labs (all labs ordered are listed, but only abnormal results are displayed) Labs Reviewed  BASIC METABOLIC PANEL - Abnormal; Notable for the following components:      Result Value   Glucose, Bld 113 (*)    All other components within normal limits  BRAIN NATRIURETIC PEPTIDE - Abnormal; Notable for the following components:   B Natriuretic Peptide 131.0 (*)    All other components within normal limits  CBC  I-STAT BETA HCG BLOOD, ED ([facility], [facility], [facility] ONLY)  TROPONIN I (HIGH SENSITIVITY)  TROPONIN I (HIGH SENSITIVITY)    EKG EKG Interpretation  Date/Time:  Thursday September 03 2019  14:23:15 EST Ventricular Rate:  114 PR Interval:    QRS Duration: 78 QT Interval:  150 QTC Calculation: 206 R Axis:   76 Text Interpretation: Atrial flutter with variable A-V block Nonspecific ST and T wave abnormality Abnormal ECG No old tracing to compare Confirmed by Aletta Edouard [phone]) on 09/03/2019 7:00:51 PM   Radiology DG Chest 2 View  Result Date: 09/03/2019 CLINICAL DATA:  Chest pain.  Tachycardia. EXAM: CHEST - 2 VIEW COMPARISON:  None. FINDINGS: The  heart size and mediastinal contours are within normal limits. Both lungs are clear. The visualized skeletal structures are unremarkable. IMPRESSION: Normal exam. Electronically Signed   By: Lorriane Shire M.D.   On: 09/03/2019 15:01    Procedures Procedures (including critical care time)  Medications Ordered in ED Medications  sodium chloride flush (NS) 0.9 % injection 3 mL (has no administration in time range)  furosemide (LASIX) injection 40 mg (40 mg Intravenous Given 09/03/19 2033)  fentaNYL (SUBLIMAZE) injection 50 mcg (50 mcg Intravenous Given 09/03/19 2037)    ED Course  I have reviewed the triage vital signs and the nursing notes.  Pertinent labs & imaging results that were available during my care of the patient were reviewed by me and considered in my medical decision making (see chart for details).    MDM Rules/Calculators/A&P                      BP 105/67   Pulse (!) 102   Temp 98.8 F (37.1 C) (Oral)   Resp (!) 28   SpO2 98%   Final Clinical Impression(s) / ED Diagnoses Final diagnoses:  Atrial fibrillation with RVR ([place])    Rx / DC Orders ED Discharge Orders    None     7:42 PM Patient with history of atrial fibrillation, presenting complaining of heart palpitation and chest discomfort.  Patient appears to be in atrial flutter with RVR.  Her blood pressure is a bit soft at 105/67 however she has been mostly stable throughout the day.  She report not missing any of her anticoagulant,  Xarelto except her nightly dose today because she has been in the ED waiting.  No significant chest discomfort at this time.  Patient also report increased fluid gain approximately 6 pounds within the past 4 days, history of CHF currently on Lasix 40 mg daily.  Chest x-ray unremarkable.  BNP is pending.  High-sensitivity troponin is normal.  Patient is also on rate control diltiazem.  I discussed care with Dr. Melina Copa.  9:41 PM Normal orthostatic vital sign.  Patient able to ambulate.  Normal troponin.  Mildly elevated BNP of 131.  Electrolytes are within normal limit.  Negative delta high-sensitivity troponin.  Chest x-ray unremarkable.  We did discuss option of cardioversion if patient is symptomatic however at this time after discussed with Dr. Melina Copa, we felt it would be best if patient follow-up closely with her cardiologist for further management of her condition.  She may increase her Lasix to 40 mg twice daily for the next several days.  And to be rechecked by her PCP.  She will also need to have a blood pressure recheck as it is low today.  10:10 PM At this time pt felt comfortable going home.  Pt understand to f/u closely with her cardiologist and also to return if her condition worsen.  We have elected not to cardiovert as her HR stabilize in the 90s and normal orthostatic vital sign.  Return precaution discussed.    Domenic Moras, PA-C 09/03/19 2213    Hayden Rasmussen, MD 09/04/19 1106

## 2019-09-03 NOTE — ED Notes (Signed)
Patient verbalizes understanding of discharge instructions. Opportunity for questioning and answers were provided. Armband removed by staff, pt discharged from ED ambulatory.   

## 2019-09-03 NOTE — Discharge Instructions (Signed)
Please take lasix 40mg  twice daily for the next 3 days.  Call and follow up closely with your heart doctor for further management of your heart palpitation.  Return promptly if you notice worsening of your symptoms or if you have other concerns.

## 2019-09-04 ENCOUNTER — Telehealth (HOSPITAL_COMMUNITY): Payer: Self-pay

## 2019-09-04 DIAGNOSIS — G4733 Obstructive sleep apnea (adult) (pediatric): Secondary | ICD-10-CM | POA: Insufficient documentation

## 2019-09-04 NOTE — Telephone Encounter (Signed)
Reached out to patient to schedule ED F/U patient states that she current have an appointment with her cardiologist with [facility]

## 2019-09-07 ENCOUNTER — Other Ambulatory Visit: Payer: Self-pay

## 2019-09-07 ENCOUNTER — Ambulatory Visit (INDEPENDENT_AMBULATORY_CARE_PROVIDER_SITE_OTHER): Payer: Medicare Other | Admitting: Licensed Clinical Social Worker

## 2019-09-07 DIAGNOSIS — F331 Major depressive disorder, recurrent, moderate: Secondary | ICD-10-CM | POA: Diagnosis not present

## 2019-09-07 DIAGNOSIS — F4310 Post-traumatic stress disorder, unspecified: Secondary | ICD-10-CM

## 2019-09-07 NOTE — Progress Notes (Signed)
Virtual Visit via Video Note   I connected with Meghan [name] on 09/07/2019 at 2:00pm by [employer] video meeting and verified that I am speaking with the correct person using two identifiers.   I discussed the limitations, risks, security and privacy concerns of performing an evaluation and management service by telephone and the availability of in person appointments. I also discussed with the patient that there may be a patient responsible charge related to this service. The patient expressed understanding and agreed to proceed.   I discussed the assessment and treatment plan with the patient. The patient was provided an opportunity to ask questions and all were answered. The patient agreed with the plan and demonstrated an understanding of the instructions.   The patient was advised to call back or seek an in-person evaluation if the symptoms worsen or if the condition fails to improve as anticipated.   I provided 30 minutes of non-face-to-face time during this encounter.     [name], LCSW, LCASA __________________________ THERAPIST PROGRESS NOTE  Session Time: 2:00pm - 2:30pm   Participation Level: Active  Behavioral Response: Alert, casually dressed, neatly groomed, euthymic   Type of Therapy:  Individual Therapy  Treatment Goals addressed: Anxiety reduction, family socialization, productivity on book   Interventions: CBT, mindfulness/grounding techniques   Summary: Meghan [name] presented to virtual therapy session today on time via Webex.  Meghan [name] was alert, oriented x5, with no evidence or self-report of SI/HI or A/V H.  Meghan [name] reported emotional ratings of 0/10 for depression and 2/10 for anxiety.  Meghan [name] reported that her birthday was yesterday, and she had positive interactions with family over the past few days which reduced her depression, despite the pandemic requiring much of it to be virtual.  Meghan [name] reported that her struggle since the last session was having an episode of atrial  fibrillation on last Thursday when she noticed that her heart rate had increased, leading her to go to the hospital for stabilization.  Meghan [name] stated "I stoped what I was doing, sat down, and took my pulse to monitor it.  I also called 911 and talked to them about what to do".  Meghan [name] reported that she plans to take it easy this week by committing to 2 hours per day working on her book, and journaling about any difficult memories that come up as she completes this memoir.  She reported that she would like to speak about these entries in future therapy sessions for assistance, since much of the current section involves processing difficult events from her childhood.  Meghan [name] was agreeable to trying 5-4-3-2-1 grounding technique and reported that this led to increase sense of calm today following recent stressful event.  Meghan [name] reported that she would follow up in one week.    Suicidal/Homicidal: None, without intent or plan   Therapist Response: Clinician met with Meghan [name] today for virtual Webex session.  Clinician assessed for safety.  Clinician inquired about Meghan [name]'s present emotional ratings at this time, including any significant changes in thoughts, feelings, or behavior since past conversation.  Clinician wished Meghan [name] a happy late birthday, and empathized with her regarding recent health scare.  Clinician inquired about how Meghan [name] attempted to cope during this stressful event, and how effective it was in helping manage crisis situation.  Clinician praised Meghan [name] for thinking calmly through crisis and offered to demonstrate 5-4-3-2-1 grounding technique as additional skill that could be used for stabilization in similar panic-inducing scenarios.  Clinician walked Meghan [name] through this exercise, which involved coaching her  to breathe in a relaxed manner while listing out loud 5 things she could see, 4 things she could touch, 3 things she could hear, 2 she could smell, and 1 she could taste.  Clinician inquired about  effectiveness of exercise and encouraged Meghan [name] to keep practicing it so that she can apply it easily when needed.  Clinician will continue to monitor.    Plan: Follow up in 1 week virtually.    Diagnosis:  Major depressive disorder, recurrent episode, moderate; PTSD  [name], [place], LCASA 09/07/19

## 2019-09-13 ENCOUNTER — Emergency Department (HOSPITAL_COMMUNITY): Payer: Medicare Other

## 2019-09-13 ENCOUNTER — Other Ambulatory Visit: Payer: Self-pay

## 2019-09-13 ENCOUNTER — Encounter (HOSPITAL_COMMUNITY): Payer: Self-pay | Admitting: Emergency Medicine

## 2019-09-13 ENCOUNTER — Emergency Department (HOSPITAL_COMMUNITY)
Admission: EM | Admit: 2019-09-13 | Discharge: 2019-09-13 | Disposition: A | Discharge status: Home / Self Care | Payer: Medicare Other | Attending: Emergency Medicine | Admitting: Emergency Medicine

## 2019-09-13 DIAGNOSIS — F419 Anxiety disorder, unspecified: Secondary | ICD-10-CM | POA: Insufficient documentation

## 2019-09-13 DIAGNOSIS — I480 Paroxysmal atrial fibrillation: Secondary | ICD-10-CM | POA: Diagnosis not present

## 2019-09-13 DIAGNOSIS — R519 Headache, unspecified: Secondary | ICD-10-CM | POA: Insufficient documentation

## 2019-09-13 DIAGNOSIS — Z79899 Other long term (current) drug therapy: Secondary | ICD-10-CM | POA: Insufficient documentation

## 2019-09-13 DIAGNOSIS — J45909 Unspecified asthma, uncomplicated: Secondary | ICD-10-CM | POA: Diagnosis not present

## 2019-09-13 DIAGNOSIS — Z7984 Long term (current) use of oral hypoglycemic drugs: Secondary | ICD-10-CM | POA: Diagnosis not present

## 2019-09-13 DIAGNOSIS — I509 Heart failure, unspecified: Secondary | ICD-10-CM | POA: Diagnosis not present

## 2019-09-13 DIAGNOSIS — R0602 Shortness of breath: Secondary | ICD-10-CM | POA: Diagnosis not present

## 2019-09-13 DIAGNOSIS — R0789 Other chest pain: Secondary | ICD-10-CM | POA: Diagnosis present

## 2019-09-13 LAB — BASIC METABOLIC PANEL
Anion gap: 8 (ref 5–15)
BUN: 15 mg/dL (ref 6–20)
CO2: 28 mmol/L (ref 22–32)
Calcium: 9.4 mg/dL (ref 8.9–10.3)
Chloride: 104 mmol/L (ref 98–111)
Creatinine, Ser: 0.97 mg/dL (ref 0.44–1.00)
GFR calc Af Amer: >60 mL/min (ref >60)
GFR calc non Af Amer: >60 mL/min (ref >60)
Glucose, Bld: 118 mg/dL — ABNORMAL HIGH (ref 70–99)
Potassium: 3.8 mmol/L (ref 3.5–5.1)
Sodium: 140 mmol/L (ref 135–145)

## 2019-09-13 LAB — TROPONIN I (HIGH SENSITIVITY)
Troponin I (High Sensitivity): <2 ng/L (ref <18)
Troponin I (High Sensitivity): <2 ng/L (ref <18)

## 2019-09-13 LAB — CBC
HCT: 37.1 % (ref 36.0–46.0)
Hemoglobin: 11.6 g/dL — ABNORMAL LOW (ref 12.0–15.0)
MCH: 26.2 pg (ref 26.0–34.0)
MCHC: 31.3 g/dL (ref 30.0–36.0)
MCV: 83.9 fL (ref 80.0–100.0)
Platelets: 275 10*3/uL (ref 150–400)
RBC: 4.42 MIL/uL (ref 3.87–5.11)
RDW: 14.9 % (ref 11.5–15.5)
WBC: 6.9 10*3/uL (ref 4.0–10.5)
nRBC: 0 % (ref 0.0–0.2)

## 2019-09-13 LAB — I-STAT BETA HCG BLOOD, ED (MC, WL, AP ONLY): I-stat hCG, quantitative: <5 m[IU]/mL (ref <5)

## 2019-09-13 MED ORDER — DIPHENHYDRAMINE HCL 50 MG/ML IJ SOLN
12.5000 mg | Freq: Once | INTRAMUSCULAR | Status: AC
  Administered 2019-09-13: 12.5 mg via INTRAVENOUS
  Filled 2019-09-13: qty 1

## 2019-09-13 MED ORDER — SODIUM CHLORIDE 0.9% FLUSH
3.0000 mL | Freq: Once | INTRAVENOUS | Status: AC
  Administered 2019-09-13: 3 mL via INTRAVENOUS

## 2019-09-13 MED ORDER — METOCLOPRAMIDE HCL 5 MG/ML IJ SOLN
10.0000 mg | Freq: Once | INTRAMUSCULAR | Status: AC
  Administered 2019-09-13: 10 mg via INTRAVENOUS
  Filled 2019-09-13: qty 2

## 2019-09-13 NOTE — Discharge Instructions (Addendum)
Continue taking all your home medications as prescribed. Follow-up with your cardiologist this week for medication management and further evaluation of your symptoms. Make sure stay well-hydrated water. Return to the emergency room with any new, worsening, concerning symptoms.

## 2019-09-13 NOTE — ED Triage Notes (Addendum)
Pt to triage via [facility] for L sided chest tightness that started at 0945.  Vomited x 1 around 7am.  Reports mild SOB.  EMS administered 1 NTG and 4 Baby ASA.  Chest pain unchanged with NTG but now reports headache.

## 2019-09-13 NOTE — ED Provider Notes (Signed)
  Physical Exam  BP (!) 116/96   Pulse 69   Temp 98.9 F (37.2 C) (Oral)   Resp 18   SpO2 99%   Physical Exam  Gen: appears nontoxic Cards: NSR  Pulm: speaking in full sentences. SpO2 stable on RA. No respiratory distress.   ED Course/Procedures   Clinical Course as of Sep 12 1708  Sun Sep 13, 1627  [id number] 53 year old female with history of paroxysmal A. fib here with increased rapid heart rate and chest pain.  She was in A. fib but not particularly rapid.  Initial troponin less than 2.  Also complaining of headache.  Currently now back in sinus.  Still complaining of headache which we will treat.  We will likely discharge and have her follow-up with her cardiologist for continued management of her A. fib.   [MB]    Clinical Course User Index [MB] Hayden Rasmussen, MD    Procedures  MDM   Patient signed out to me by S of still, PA-C.  Please see previous notes for further history.  In brief, patient presented for evaluation of chest tightness and palpitations.  History of paroxysmal A. fib.  Upon arrival, she was in A. fib without RVR.  Currently reporting just a headache.  She converted to normal sinus rhythm without intervention.  Labs reassuring, initial troponin negative.  Delta pending.  No obvious electrolyte abnormalities.  Chest x-ray without acute abnormality.  Headache cocktail being given for headache.  If resolved and patient remains in sinus rhythm, plan for discharge.  Patient reported worsening shortness of breath to tach.  On reevaluation, patient was resting comfortably, sats stable.  She had just received Reglan, consider dystonic rxn. 12.5 benadryl given.  Repeat EKG continues to show sinus rhythm without A. fib.  On reassessment, patient reports headache is resolved.  Shortness of breath has resolved.  She has ambulated with stable sats.  Blood pressure stable.  Discussed follow-up with her cardiologist this week for medication management and reevaluation.  At  this time, patient appears safe for discharge.  Return precautions given.  Patient states she understands and agrees to plan.      Franchot Heidelberg, PA-C 09/13/19 1712    Little, Wenda Over[place], MD 09/13/19 [phone]

## 2019-09-13 NOTE — ED Notes (Signed)
Patient able to ambulate to and from bathroom without assist. spo2 >98%

## 2019-09-13 NOTE — ED Provider Notes (Signed)
[facility] EMERGENCY DEPARTMENT Provider Note   CSN: [id number] Arrival date & time: 09/13/19  1226     History Chief Complaint  Patient presents with  . Chest Pain    Meghan [name] is a 53 y.o. female.  Patient to ED with complaint of chest tightness in left chest to shoulder, and fast-rate palpitations similar to episodes of her paroxysmal atrial fibrillation. She was seen in the ED for A-fib with RVR recently and on office follow up with her cardiologist, Dr. Dorthula Nettles, her diltiazem dose with increased and she was started on Flecainide. She states she feels like her heart rate is increased after each dose of the later since starting. No fever, cough, nausea, diaphoresis. She also reports having a headache that is frontal, throbbing, bilateral. It does not feel like a sinus headache, which she is familiar with. No visual change, speech difficulty, imbalance or weakness.   The history is provided by the patient. No language interpreter was used.  Chest Pain Associated symptoms: headache and palpitations   Associated symptoms: no cough, no fever, no vomiting and no weakness        Past Medical History:  Diagnosis Date  . Arthritis    left knee  . Asthma   . Atrial fibrillation ([place])   . CHF (congestive heart failure) ([place]) 01/2019   Physician from [place] informed her she had this   . Dysrhythmia    Atrial fibrillation  . History of pulmonary embolus (PE)   . Macular degeneration    of left eye  . Pneumonia    in the past  . Seizure disorder ([place]) 2017  . Sleep apnea   . Stroke [facility])    1989 and 1995 (left sided weakness)    There are no problems to display for this patient.   Past Surgical History:  Procedure Laterality Date  . ABDOMINAL HYSTERECTOMY  2003   complete  . CARDIAC CATHETERIZATION  2017   in [facility] in [place]  . CESAREAN SECTION  [id number]   x 2  . CESAREAN SECTION     x 2  . CHOLECYSTECTOMY  2003  .  COLONOSCOPY WITH PROPOFOL N/A 06/05/2019   Procedure: COLONOSCOPY WITH PROPOFOL;  Surgeon: Carol Ada, MD;  Location: [facility] ENDOSCOPY;  Service: Endoscopy;  Laterality: N/A;  . HERNIA REPAIR  [id number], [id number]   umbilical hernia repair  . IVC FILTER INSERTION  2003   Hx pulmonary embolus  . POLYPECTOMY  06/05/2019   Procedure: POLYPECTOMY;  Surgeon: Carol Ada, MD;  Location: [facility] ENDOSCOPY;  Service: Endoscopy;;  . SHOULDER ARTHROSCOPY W/ ROTATOR CUFF REPAIR  08/28/2017   right shoulder     OB History   No obstetric history on file.     No family history on file.  Social History   Tobacco Use  . Smoking status: Never Smoker  . Smokeless tobacco: Never Used  Substance Use Topics  . Alcohol use: Never  . Drug use: Never    Home Medications Prior to Admission medications   Medication Sig Start Date End Date Taking? Authorizing Provider  budesonide-formoterol (SYMBICORT) 160-4.5 MCG/ACT inhaler Inhale 2 puffs into the lungs 2 (two) times daily.    [provider]  diltiazem (DILT-XR) 120 MG 24 hr capsule Take 120 mg by mouth daily.    [provider]  furosemide (LASIX) 40 MG tablet Take 40 mg by mouth daily. In the morning    [provider]  metFORMIN (GLUCOPHAGE) 500 MG  tablet Take 250 mg by mouth daily.    [provider]  Multiple Vitamins-Minerals (PRESERVISION AREDS 2 PO) Take 2 tablets by mouth daily. softgels    [provider]  rivaroxaban (XARELTO) 20 MG TABS tablet Take 20 mg by mouth every evening.    [provider]  tiZANidine (ZANAFLEX) 4 MG tablet Take 4 mg by mouth every 8 (eight) hours as needed for muscle spasms.    [provider]  vitamin B-12 (CYANOCOBALAMIN) 1000 MCG tablet Take 1,000 mcg by mouth daily.    [provider]    Allergies    Food, Acetaminophen-codeine, Coconut oil, and Tomato  Review of Systems   Review of Systems  Constitutional: Negative for chills and fever.  HENT:  Negative.   Respiratory: Positive for chest tightness. Negative for cough.   Cardiovascular: Positive for chest pain and palpitations.  Gastrointestinal: Negative.  Negative for vomiting.  Musculoskeletal: Negative.   Skin: Negative.   Neurological: Positive for headaches. Negative for facial asymmetry, speech difficulty and weakness.    Physical Exam Updated Vital Signs BP 96/61   Pulse 71   Temp 98.9 F (37.2 C) (Oral)   Resp 18   SpO2 98%   Physical Exam Vitals and nursing note reviewed.  Constitutional:      Appearance: She is well-developed.  HENT:     Head: Normocephalic.     Nose:     Comments: No sinus tenderness.  Cardiovascular:     Rate and Rhythm: Normal rate and regular rhythm.     Heart sounds: No murmur.  Pulmonary:     Effort: Pulmonary effort is normal.     Breath sounds: Normal breath sounds. No wheezing, rhonchi or rales.  Chest:     Chest wall: No tenderness.  Abdominal:     General: Bowel sounds are normal.     Palpations: Abdomen is soft.     Tenderness: There is no abdominal tenderness. There is no guarding or rebound.  Musculoskeletal:        General: Normal range of motion.     Cervical back: Normal range of motion and neck supple.     Right lower leg: No edema.     Left lower leg: No edema.  Skin:    General: Skin is warm and dry.     Findings: No rash.  Neurological:     Mental Status: She is alert and oriented to person, place, and time.     Comments: CN's 3-12 grossly intact. Speech is clear and focused. No facial asymmetry. No lateralizing weakness. No deficits of coordination.      ED Results / Procedures / Treatments   Labs (all labs ordered are listed, but only abnormal results are displayed) Labs Reviewed  BASIC METABOLIC PANEL - Abnormal; Notable for the following components:      Result Value   Glucose, Bld 118 (*)    All other components within normal limits  CBC - Abnormal; Notable for the following components:    Hemoglobin 11.6 (*)    All other components within normal limits  I-STAT BETA HCG BLOOD, ED ([facility], [facility], [facility] ONLY)  TROPONIN I (HIGH SENSITIVITY)  TROPONIN I (HIGH SENSITIVITY)    EKG EKG Interpretation  Date/Time:  Sunday September 13 2019 12:39:01 EST Ventricular Rate:  92 PR Interval:    QRS Duration: 84 QT Interval:  440 QTC Calculation: 544 R Axis:   51 Text Interpretation: Atrial fibrillation T wave abnormality, consider anterior ischemia Abnormal  ECG Confirmed by Aletta Edouard [phone]) on 09/13/2019 2:20:07 PM   Radiology DG Chest 2 View  Result Date: 09/13/2019 CLINICAL DATA:  Chest pain, LEFT side chest tightness since 0945 hours, mild shortness of breath, history of atrial fibrillation EXAM: CHEST - 2 VIEW COMPARISON:  09/03/2019 FINDINGS: Enlargement of cardiac silhouette. Mediastinal contours and pulmonary vascularity normal. Mild chronic accentuation of basilar markings. No acute infiltrate, pleural effusion or pneumothorax. Prominent vascular marking in the RIGHT upper lobe appears unchanged. Bones demineralized without acute osseous findings. IMPRESSION: Enlargement of cardiac silhouette. No acute abnormalities. Electronically Signed   By: Lavonia Dana M.D.   On: 09/13/2019 13:43    Procedures Procedures (including critical care time)  Medications Ordered in ED Medications  sodium chloride flush (NS) 0.9 % injection 3 mL (has no administration in time range)    ED Course  I have reviewed the triage vital signs and the nursing notes.  Pertinent labs & imaging results that were available during my care of the patient were reviewed by me and considered in my medical decision making (see chart for details).  Clinical Course as of Sep 12 1499  Sun Dec 27, 226  3490 53 year old female with history of paroxysmal A. fib here with increased rapid heart rate and chest pain.  She was in A. fib but not particularly rapid.  Initial troponin less than 2.  Also complaining of  headache.  Currently now back in sinus.  Still complaining of headache which we will treat.  We will likely discharge and have her follow-up with her cardiologist for continued management of her A. fib.   [MB]    Clinical Course User Index [MB] Hayden Rasmussen, MD   MDM Rules/Calculators/A&P                      Patient to ED with chest tightness and palpitations. On arrival she was found to be in atrial fibrillation without RVR. On exam, this spontaneously converted to NSR, and her symptoms of tightness and palpitations have resolved. She complains of headache.   No neurologic deficits present. Reglan and Benadryl provided for headache relief.   On re-evaluation, the patient reports SOB since receiving the Reglan. She feels restless, uncomfortable, anxious. Additional 12.5 mg Benadryl ordered as these symptoms were felt likely Reglan reaction. She remains in NSR, normal rate.   Patient care signed out to [facility], PA-C, pending re-evaluation and review of labs.  Final Clinical Impression(s) / ED Diagnoses Final diagnoses:  None   1. Chest tightness 2. Paroxysmal Atrial fibrillation 3. Nonspecific headache  Rx / DC Orders ED Discharge Orders    None       Dennie Bible 09/15/19 [id number]    Hayden Rasmussen, MD 09/15/19 [phone]

## 2019-09-14 ENCOUNTER — Ambulatory Visit (HOSPITAL_COMMUNITY): Payer: Medicare Other | Admitting: Licensed Clinical Social Worker

## 2019-09-14 ENCOUNTER — Telehealth (HOSPITAL_COMMUNITY): Payer: Self-pay

## 2019-09-14 NOTE — Telephone Encounter (Signed)
Contacted patient and she states she called and scheduled herself an appointment with her Cardiologist- Dr. Shirlee More on January 4th 2021.

## 2019-09-21 ENCOUNTER — Ambulatory Visit (INDEPENDENT_AMBULATORY_CARE_PROVIDER_SITE_OTHER): Payer: Medicare Other | Admitting: Licensed Clinical Social Worker

## 2019-09-21 ENCOUNTER — Other Ambulatory Visit: Payer: Self-pay

## 2019-09-21 DIAGNOSIS — F4310 Post-traumatic stress disorder, unspecified: Secondary | ICD-10-CM

## 2019-09-21 DIAGNOSIS — F331 Major depressive disorder, recurrent, moderate: Secondary | ICD-10-CM | POA: Diagnosis not present
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
## 2019-09-28 ENCOUNTER — Ambulatory Visit (INDEPENDENT_AMBULATORY_CARE_PROVIDER_SITE_OTHER): Payer: Medicare Other | Admitting: Licensed Clinical Social Worker

## 2019-09-28 ENCOUNTER — Other Ambulatory Visit: Payer: Self-pay

## 2019-09-28 DIAGNOSIS — F4310 Post-traumatic stress disorder, unspecified: Secondary | ICD-10-CM

## 2019-09-28 DIAGNOSIS — F331 Major depressive disorder, recurrent, moderate: Secondary | ICD-10-CM | POA: Diagnosis not present

## 2019-09-28 NOTE — Progress Notes (Signed)
Virtual Visit via Video Note   I connected with [name] on 09/28/19 at 3:00pm by [employer] video meeting and verified that I am speaking with the correct person using two identifiers.   I discussed the limitations, risks, security and privacy concerns of performing an evaluation and management service by telephone and the availability of in person appointments. I also discussed with the patient that there may be a patient responsible charge related to this service. The patient expressed understanding and agreed to proceed.   I discussed the assessment and treatment plan with the patient. The patient was provided an opportunity to ask questions and all were answered. The patient agreed with the plan and demonstrated an understanding of the instructions.   The patient was advised to call back or seek an in-person evaluation if the symptoms worsen or if the condition fails to improve as anticipated.   I provided 45 minutes of non-face-to-face time during this encounter.     [name], LCSW, LCASA _______________________ THERAPIST PROGRESS NOTE  Session Time: 3:00pm - 3:45pm   Participation Level: Active   Behavioral Response: Alert, casually dressed, anxious and depressed mood  Type of Therapy:  Individual Therapy  Treatment Goals addressed: Anxiety/depression reduction; Exercise/diet; Grieving past losses   Interventions: CBT, solution focused therapy   Summary: Sherell presented on time, alert, and oriented x5 for virtual session today via Webex.  Lurine denied SI/HI and A/V H at this time.  Trinty reported scores of 7/10 for both depression and anxiety today and stated "I haven't been very happy this week".  Gisselle reported that her daughter is currently pregnant with her 5th child, and she got into an argument with her the other day about this, since they don't see eye to eye.  Sanjuanita acknowledged that she is worried about how this pregnancy will affect her own stress, since it  seems like even after moving to [place], she cannot get away from the struggles of her children.  Melrose reported that she would minimize contact with daughter to follow treatment goal, and try to speak to her daughter's boyfriend instead to check in with minimal anxiety.  Antonio stated "She is a grown woman and I need to back up and give her space".  Journe reported that an additional stressor was speaking with her cardiologist the other day, and learning that there isn't a cure for her heart issues. Derrian reported that she was recommended to add an additional medication to her regimen, and will meet with a nutritionist/dietician on the 19th to discuss a care plan.  Jossalin reported that she will go ahead and start working out for 10 minutes, 3 times per day using light cardio instructional videos on Youtube so that she can reach weight loss goal suggested by doctor of 80lbs.  Kanai also admitted that she likely feels more depressed today because she has been bottling up emotions for decades, and has started reflecting on this more since beginning therapy.  Greenly reported that due to her childhood, she had to be strong, and has 'decades of tears' which have built up, and acknowledged that she has not cried at any funerals in recent years, which has bothered her significantly.  Donabelle reported that she will try using writing as an outlet for her emotions to get more in touch with how she is feeling moment to moment for healthier coping.  Naama reported that she would also try to be more honest with family and friends about how she is  feeling so that she will understand her better and be able to support her during difficult times.  Annalynn reported that she would follow up virtually in one week.     Suicidal/Homicidal: None, without plan or intent    Therapist Response: Clinician met with Tondalaya virtually via Webex today.  Clinician assessed for safety.  Clinician inquired about Annais's present emotional ratings, as well as  any significant changes in thoughts, feelings, or behaviors since last session.  Clinician empathized with Shaivi regarding family conflict, and inquired about how to plans to continue implementing healthier boundaries to protect her mental and physical health. Clinician was supportive of plan of moderating contact with Nadina's daughter based upon the stress Natalija was facing prior to starting treatment. Clinician inquired about the plan Kimyatta's doctor recommended for her to improve heart health in following months, and how she plans to go about implementing this. Clinician explored Alois's coping pattern of bottling emotions over time with her, including when it began, and how she can begin to open up in healthier ways rather than continue repression.  Clinician will continue to monitor.       Plan: Follow up again in 1 week virtually.    Diagnosis: Major depressive disorder, recurrent, moderate; PTSD   [name], [place], LCASA 09/28/19

## 2019-10-05 ENCOUNTER — Ambulatory Visit (INDEPENDENT_AMBULATORY_CARE_PROVIDER_SITE_OTHER): Payer: Medicare Other | Admitting: Licensed Clinical Social Worker

## 2019-10-05 ENCOUNTER — Other Ambulatory Visit: Payer: Self-pay

## 2019-10-05 DIAGNOSIS — F331 Major depressive disorder, recurrent, moderate: Secondary | ICD-10-CM | POA: Diagnosis not present

## 2019-10-05 DIAGNOSIS — F4310 Post-traumatic stress disorder, unspecified: Secondary | ICD-10-CM
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
## 2019-10-12 ENCOUNTER — Ambulatory Visit (INDEPENDENT_AMBULATORY_CARE_PROVIDER_SITE_OTHER): Payer: Medicare Other | Admitting: Licensed Clinical Social Worker

## 2019-10-12 DIAGNOSIS — F4310 Post-traumatic stress disorder, unspecified: Secondary | ICD-10-CM | POA: Diagnosis not present

## 2019-10-12 DIAGNOSIS — F331 Major depressive disorder, recurrent, moderate: Secondary | ICD-10-CM | POA: Diagnosis not present
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
## 2019-10-19 ENCOUNTER — Other Ambulatory Visit: Payer: Self-pay

## 2019-10-19 ENCOUNTER — Ambulatory Visit (INDEPENDENT_AMBULATORY_CARE_PROVIDER_SITE_OTHER): Payer: Medicare Other | Admitting: Licensed Clinical Social Worker

## 2019-10-19 DIAGNOSIS — F4310 Post-traumatic stress disorder, unspecified: Secondary | ICD-10-CM | POA: Diagnosis not present

## 2019-10-19 DIAGNOSIS — F331 Major depressive disorder, recurrent, moderate: Secondary | ICD-10-CM

## 2019-10-19 NOTE — Progress Notes (Signed)
Virtual Visit via Video Note  I connected withRenee Woodon2/1/21at1:00pmbyCisco Webex video meetingand verified that I am speaking with the correct person using two identifiers.  I discussed the limitations, risks, security and privacy concerns of performing an evaluation and management service by telephone and the availability of in person appointments. I also discussed with the patient that there may be a patient responsible charge related to this service. The patient expressed understanding and agreed to proceed.  I discussed the assessment and treatment plan with the patient. The patient was provided an opportunity to ask questions and all were answered. The patient agreed with the plan and demonstrated an understanding of the instructions.  The patient was advised to call back or seek an in-person evaluation if the symptoms worsen or if the condition fails to improve as anticipated.  I provided45 minutes of non-face-to-face time during this encounter.   [name], LCSW, LCASA _______________________ THERAPIST PROGRESS NOTE  Session Time:1:00pm -1:45pm  Participation Level:Active  Behavioral Response:Alert, casually dressed,anxious mood  Type of Therapy: Individual Therapy  Treatment Goals addressed:Anxietymanagement; Self-care routine; Healthy Boundaries   Interventions:CBT  Summary: Meghan [name] presented on time, alert, and oriented x5 for virtual session today via Webex. Meghan [name] denied SI/HI and A/V H at this time.  Meghan [name] reported scores of 3/10 for depression and 10/10 for anxiety today.  Meghan [name] reported that this past week has been challenging and driven her anxiety up because she learned that her daughter has COVID and was kicked out of the place she was staying because of this.  Meghan [name] reported that she is continuing to implement healthy boundaries despite this, and avoid flying down to help her.  Meghan [name] reported that she tries to think rationally about  this, but still finds herself worrying about her daughter, as well as her own health, since she has to see her neurologist on Friday and doesn't know what to expect.  Meghan [name] reported that she has not been using her relaxation skills or writing very much, and admitted she stayed in bed nearly all day yesterday, except when she got up for virtual bible study. Greg reported that by discussing these recent issues in session today for perspective, she did find some relief, stating "It feels good to get all of this off my chest".  She reported that her anxiety decreased to 3-4/10 in severity and she felt more confident that her higher power has a plan in place for her. Meghan [name] reported that she would go for a 20 minute walk after conversation and then plan to write for 1-2 hours so that she can stay productive and on track with publishing deadline. She reported that she would follow up again in 1 week.       Suicidal/Homicidal: None, without plan or intent    Therapist Response: Clinician met with Meghan [name] virtually via Webex application today.  Clinician assessed for safety.  Clinician inquired about Meghan [name]'s emotional ratings at this time, as well as any significant changes in thoughts, feelings, or behaviors since last meeting.  Clinician inquired about Meghan [name]'s successes and struggles in previous week.  Clinician empathized with Meghan [name] regarding her and her family's health struggles at this time, and inquired about how she has been attempting to cope with these stressors.  Clinician worked with Meghan [name] to reframe current challenges to highlight her resiliency and noted that her use of boundaries has increased her ability to focus on elements of life she can control, rather than those that she cannot, in addition to increasing daughter's sense  of independence as a secondary benefit.  Clinician inquired about how much time Meghan [name] has been committing to treatment goals at this time, and encouraged her to utilize her writing has  an outlet to process recent struggles, in addition to light daily exercise for her health and additional stress relief.  Clinician will continue to monitor.                Plan:Follow up again in 1 week virtually.   Diagnosis: Major depressive disorder, recurrent, moderate; PTSD  [name], [place], LCASA 10/19/19

## 2019-10-26 ENCOUNTER — Ambulatory Visit (INDEPENDENT_AMBULATORY_CARE_PROVIDER_SITE_OTHER): Payer: Medicare Other | Admitting: Licensed Clinical Social Worker

## 2019-10-26 ENCOUNTER — Other Ambulatory Visit: Payer: Self-pay

## 2019-10-26 DIAGNOSIS — F4310 Post-traumatic stress disorder, unspecified: Secondary | ICD-10-CM | POA: Diagnosis not present

## 2019-10-26 DIAGNOSIS — F331 Major depressive disorder, recurrent, moderate: Secondary | ICD-10-CM | POA: Diagnosis not present

## 2019-10-26 NOTE — Progress Notes (Signed)
Virtual Visit via Video Note  I connected withRenee Woodon2/8/21at1:00pmbyCisco Webex video meetingand verified that I am speaking with the correct person using two identifiers.  I discussed the limitations, risks, security and privacy concerns of performing an evaluation and management service by telephone and the availability of in person appointments. I also discussed with the patient that there may be a patient responsible charge related to this service. The patient expressed understanding and agreed to proceed.  I discussed the assessment and treatment plan with the patient. The patient was provided an opportunity to ask questions and all were answered. The patient agreed with the plan and demonstrated an understanding of the instructions.  The patient was advised to call back or seek an in-person evaluation if the symptoms worsen or if the condition fails to improve as anticipated.  I provided45 minutesof non-face-to-face time during this encounter.   [name], LCSW, LCASA _______________________ THERAPIST PROGRESS NOTE  Session Time:1:00pm -1:45pm  Participation Level:Active  Behavioral Response:Alert, casually dressed, euthymic mood/affect  Type of Therapy: Individual Therapy  Treatment Goals addressed:Anxietymanagement; Exercise routine; PCP appointments; Sleep hygiene; Socialization with family; Health boundaries; Writing progress   Interventions:CBT, mindfulness   Summary: Safiya presented to [facility] session on time, alert, and oriented x5, with no evidence or self-report of SI/HI or A/V H at this time.  Marwah reported scores of 0/10 for both depression and anxiety today.  Daniah reported that her anxiety was at 5/10 on Friday while she began 3 day monitoring at home for her EEG along with 3 consecutive doses of medication, but she handled this well despite a few side effects of weakness, and body aches, which passed.  Aerith stated "It was a sigh  of relief".  Majestic reported that she continues enforcing healthy boundaries with her daughter, who remains in quarantine at a hotel while she recovers from recent COVID diagnosis. Khamiya reported that her daughter has become more accepting of their arrangement now and doesn't speak as much about things concerning her as often as she becomes more independent.  Dyanna reported that she has not been able to work out much due to her 3 day medical monitoring, but her sister sent her a workout video and she plans to begin doing this x3 daily, in addition to checking in with the [employer] to see if they have begun water aerobics again.  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  Ossie reported that she is getting 6-7 hours of sleep per night now and tends to go to bed each night around 10am.  Loma reported that she has been using relaxation techniques, but not as frequently due to improved mood.  She stated "If there is stupid stuff or bad news going on, I will breathe or imagine myself at the beach and that calms me down".  Jamielyn reported that she has noticed that when she bottles up feelings like stress, her body feels more tense, particularly in her chest, and she tends to clench her jaw too.  Zaelyn was agreeable to trying exercise today in session and participated in muscle relaxation.  Mariea reported that she felt better afterward and more energized, and would consider trying this at night as well if she feels areas of tension interfering with her rest.  Kyona reported that she would follow up again in 1 week.  Suicidal/Homicidal: None, without plan or intent    Therapist Response: Clinician met with Dru virtually today for Webex virtual session. Clinician assessed for safety.  Clinician inquired about Leonetta's current emotional ratings, as well as any significant  changes in thoughts, feelings, or behaviors since previous session.  Clinician inquired about progresses Janny has made towards treatment goals in past week, as well as areas that require focus. Clinician praised Darcia for her progress at this time and encouraged her to continue self-care routine, as well as healthy use of boundaries with family to avoid unnecessary stress to mental and physical health.  Clinician inquired about how stress manifests in Aarohi's life when she feels overwhelmed. Clinician offered to demonstrate progressive muscle relaxation exercise with Lilygrace today to assist her with stress related tension.  Clinician guided Yumalay through process of getting comfortable, finding relaxing breathing pattern, and then sequentially tensing muscle groups in body for 5 seconds (hand, arm, leg, chest, neck, etc), before allowing them to rest.  Clinician encouraged Astria not to tense injury areas. Clinician inquired about effectiveness afterward, and encouraged her to add this to relaxation routine.  Clinician will continue to monitor.    Plan:Follow up again in 1 week virtually.   Diagnosis: Major depressive disorder, recurrent, moderate; PTSD  [name], [place], LCASA 10/26/19

## 2019-11-02 ENCOUNTER — Ambulatory Visit (INDEPENDENT_AMBULATORY_CARE_PROVIDER_SITE_OTHER): Payer: Medicare Other | Admitting: Licensed Clinical Social Worker

## 2019-11-02 ENCOUNTER — Other Ambulatory Visit: Payer: Self-pay

## 2019-11-02 DIAGNOSIS — F4310 Post-traumatic stress disorder, unspecified: Secondary | ICD-10-CM

## 2019-11-02 DIAGNOSIS — F331 Major depressive disorder, recurrent, moderate: Secondary | ICD-10-CM

## 2019-11-02 NOTE — Progress Notes (Signed)
Virtual Visit via Video Note  I connected withRenee Woodon2/15/21at2:00pmbyCisco Webex video meetingand verified that I am speaking with the correct person using two identifiers.  I discussed the limitations, risks, security and privacy concerns of performing an evaluation and management service by telephone and the availability of in person appointments. I also discussed with the patient that there may be a patient responsible charge related to this service. The patient expressed understanding and agreed to proceed.  I discussed the assessment and treatment plan with the patient. The patient was provided an opportunity to ask questions and all were answered. The patient agreed with the plan and demonstrated an understanding of the instructions.  The patient was advised to call back or seek an in-person evaluation if the symptoms worsen or if the condition fails to improve as anticipated.  I provided1 hourof non-face-to-face time during this encounter.   [name], LCSW, LCAS _______________________ THERAPIST PROGRESS NOTE  Session Time:2:00pm -3:00pm  Participation Level:Active  Behavioral Response:Alert, casually dressed, anxious mood/affect  Type of Therapy: Individual Therapy  Treatment Goals addressed:Anxietymanagement; Self-care routine; Exercise routine; PCP appointments; Socialization with family; Healthy boundaries; Writing progress  Interventions:CBT, exploring coping skills/self-care activities   Summary: Johari presented for virtual Webex appointment today.  She was alert, oriented x5, with no evidence or self-report of SI/HI or A/V H at this time.  Mikaiya reported scores of 0/10 for depression and 10/10 for anxiety today, stating "I got some news from the doctor and they found a few things they weren't happy about".  Mazi reported that her recent assessment revealed that she has a blood clot in her head and she will need to go in for a  procedure on Wednesday.  Derek reported that she has gone in for clot-related work before, but never had one in her head, which is why she is more alarmed.  Elnore worked with clinician to organize her thoughts and created a list of questions ahead of appointment regarding what her chances are of getting sick or having side effects of treatment, revisiting process of self-injections to ensure she remembers how to do it properly, and a step-by-step breakdown of what is involved in the procedure so that she will be more at ease.  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  Carolynne reported that she also continues enforcing healthier boundaries with her daughter, who she just learned is having twins and wants Nayellie to visit in April around the due date.  Avon reported that she looks forward to having more free time upon book completion and expressed openness to exploring additional coping skills or self-care activities today in session.  Draven reported that she plans to go to [employer] to find new books to read, go swimming at [employer], do online puzzles, volunteer, play with her grandchildren, listen to uplifting gospel music, clean and organize her home, and schedule self-care days for pedicures.  Tomicka reported that she would follow up again in 1 week.       Suicidal/Homicidal: None, without plan or intent    Therapist Response: Clinician met with Trinty for Webex session today. Clinician assessed for safety.  Clinician inquired about Candela's emotional ratings at this time, as well as any significant changes in thoughts, feelings,  or behaviors since last conversation.  Clinician empathized with Lorali regarding recent medical news and assisted her in organizing a list  of questions she could ask the doctor when they meet again so that she will be able to gain a clearer picture of her next steps and reduce overall anxiety.  Clinician inquired about Fleta's successes and struggles in past week. Clinician praised Niya on her progress at this time, and inquired about how her daily routine might change once her book is completed, since this could lead to more boredom if she no longer has a task to focus on for 14-21 hours per week.  Clinician suggested exploring healthy coping skills and self-care activities today, and went through a list of 50 different ones with her, with examples such as drawing/painting/coloring, visiting a Art therapist, organizing the home, doing yoga, making a collage, starting a garden, getting a Designer, multimedia or pedicure, and more.  Clinician inquired about which ones Shondrea could see herself including in future routine.  Clinician will continue to monitor.                           Plan:Follow up again in 1 week virtually.   Diagnosis: Major depressive disorder, recurrent, moderate; PTSD  [name], [facility] 11/02/19

## 2019-11-09 ENCOUNTER — Ambulatory Visit (INDEPENDENT_AMBULATORY_CARE_PROVIDER_SITE_OTHER): Payer: Medicare Other | Admitting: Licensed Clinical Social Worker

## 2019-11-09 ENCOUNTER — Other Ambulatory Visit: Payer: Self-pay

## 2019-11-09 DIAGNOSIS — F331 Major depressive disorder, recurrent, moderate: Secondary | ICD-10-CM

## 2019-11-09 DIAGNOSIS — F4310 Post-traumatic stress disorder, unspecified: Secondary | ICD-10-CM

## 2019-11-09 NOTE — Progress Notes (Signed)
Virtual Visit via Video Note  I connected withRenee Woodon2/22/21at1:00pmbyCisco Webex video meetingand verified that I am speaking with the correct person using two identifiers.  I discussed the limitations, risks, security and privacy concerns of performing an evaluation and management service by telephone and the availability of in person appointments. I also discussed with the patient that there may be a patient responsible charge related to this service. The patient expressed understanding and agreed to proceed.  I discussed the assessment and treatment plan with the patient. The patient was provided an opportunity to ask questions and all were answered. The patient agreed with the plan and demonstrated an understanding of the instructions.  The patient was advised to call back or seek an in-person evaluation if the symptoms worsen or if the condition fails to improve as anticipated.  I provided1 hour of non-face-to-face time during this encounter.   [name], LCSW, LCAS _______________________ THERAPIST PROGRESS NOTE  Session Time:1:00pm -2:00pm  Participation Level:Active  Behavioral Response:Alert, casually dressed,combined anxious and depressed mood/affect  Type of Therapy: Individual Therapy  Treatment Goals addressed:Anxietymanagement;Self-care routine; Exercise routine; Socialization with family; Healthy boundaries  Interventions:CBT  Summary: Meghan [name] presented for Webex therapy session today.  Meghan [name] was alert, oriented x5, with no evidence or self-report of SI/HI or A/V H at this time.  Meghan [name] reported scores of 10/10 for both depression and anxiety today, stating "I'm concerned about my daughter's depression". Meghan [name] reported that things had been going well up until today, as she continues trying to exercise daily, follow a healthy diet, write for a minimum of 3 hours each day, socialize with supportive family, and use relaxation techniques  daily.  Meghan [name] reported that earlier today she received a phone call from her sister unexpectedly while she was making breakfast, and found this unusual, since her sister tends to be at work during that time, so her first thought was that something was wrong, and she frantically rushed to the phone, heart pounding, worried that there had been an accident.  Meghan [name] reported that it turned out that her sister had good news and couldn't wait to share it with her.  Meghan [name] experienced a similar situation once she received a call during this session from her daughter, and felt certain in that moment that something must be wrong since she is aware of Meghan [name]'s appointment times and rarely calls unexpectedly unless something is awry.  After calling her daughter back, Meghan [name] revealed that her daughter just wanted to check in on her, and assured her that nothing was wrong.  Meghan [name] appeared relieved after this and opened up about how her natural tendency to panic could be related to past losses in her life, such as when her grandmother passed, and she was the only one willing to go in and check on her.  Meghan [name] reported that these instances of panic lead her heart rate to increase, increase racing thoughts, and take some time to calm down from, so she will try to challenge these thoughts more often and consider realistic alternatives to avoid getting worked up.  Meghan [name] reported that following this discussion, she was more at ease and found her anxiety had decreased to 5/10.  Meghan [name] reported that she would follow up again in 1 week.       Suicidal/Homicidal: None, without plan or intent    Therapist Response: Clinician met with Meghan [name] for Webex session. Clinician assessed for safety.  Clinician inquired about Meghan [name]'s emotional ratings today.  Clinician inquired about progress Meghan [name] is making towards  goals, and also provided Jason with space to share about her challenges with family at this time, including how her anxiety remains heavily  influenced by concern for close supports like her daughter.  Clinician utilized cognitive behavioral model to highlight how events like unexpected phone calls can lead Meghan [name]'s anxiety to increase drastically as she ruminates on distressing scenarios that can unfold and paralyze her with fear when she feels helpless to do anything.  Clinician worked with Meghan [name] to substitute her irrational thoughts for more realistic, less distressing ones which will help her stay calm, including considering the fact that family could just be checking in with her, and not be in a crisis scenario.  Clinician encouraged Meghan [name] to journal about these experiences to better understand interplay between her thoughts, feelings and behavior week to week.  Clinician will continue to monitor.                           Plan:Follow up again in 1 week virtually.   Diagnosis: Major depressive disorder, recurrent, moderate; PTSD  [name], [facility] 11/09/19

## 2019-11-16 ENCOUNTER — Ambulatory Visit (INDEPENDENT_AMBULATORY_CARE_PROVIDER_SITE_OTHER): Payer: Medicare Other | Admitting: Licensed Clinical Social Worker

## 2019-11-16 ENCOUNTER — Other Ambulatory Visit: Payer: Self-pay

## 2019-11-16 DIAGNOSIS — F331 Major depressive disorder, recurrent, moderate: Secondary | ICD-10-CM | POA: Diagnosis not present

## 2019-11-16 DIAGNOSIS — F4310 Post-traumatic stress disorder, unspecified: Secondary | ICD-10-CM | POA: Diagnosis not present

## 2019-11-16 NOTE — Progress Notes (Signed)
Virtual Visit via Video Note  I connected withRenee Woodon3/1/21at2:00pmbyCisco Webex video meetingand verified that I am speaking with the correct person using two identifiers.  I discussed the limitations, risks, security and privacy concerns of performing an evaluation and management service by telephone and the availability of in person appointments. I also discussed with the patient that there may be a patient responsible charge related to this service. The patient expressed understanding and agreed to proceed.  I discussed the assessment and treatment plan with the patient. The patient was provided an opportunity to ask questions and all were answered. The patient agreed with the plan and demonstrated an understanding of the instructions.  The patient was advised to call back or seek an in-person evaluation if the symptoms worsen or if the condition fails to improve as anticipated.  I provided1 hour of non-face-to-face time during this encounter.   [name], LCSW, LCAS _______________________ THERAPIST PROGRESS NOTE  Session Time:2:00pm -3:00pm   Participation Level:Active  Behavioral Response:Alert, casually dressed,euthymic mood/affect  Type of Therapy: Individual Therapy  Treatment Goals addressed:Anxietymanagement;Self-care routine;Exercise routine; Socialization with family; Healthyboundaries; Writing progress   Interventions:CBT  Summary:Meghan [name] is a 54 year old African American female that presented for virtual therapy appointment today with Major Depressive Disorder, recurrent, moderate, and PTSD.    Suicidal/Homicidal:None; without plan or intent  Therapist Response:Clinician met with Meghan [name] for Webex session today assessed for safety and medication compliance. Meghan [name] presented on time, alert, oriented x5, with no evidence of SI/HI or A/V H.  She also reported ongoing compliance with medications.  Clinician also inquired  about Meghan [name]'s emotional ratings today, as well as any significant changes in thoughts, feelings, or behavior since last session.  Meghan [name] reported scores of 0/10 for both depression and anxiety today and stated "I'm doing good.  I've just been doing a lot of writing".  Clinician inquired about progress Meghan [name] has made towards treatment goals in past week, as well as any barriers present.  Meghan [name] reported that she has been very productive in past week, writing for around 16 hours in past week, working out for two days by taking walks when the weather was nice, staying in touch with family out of state while planning for upcoming birthdays and due date for grandchildren, as well as keeping up with medical appointments.  Meghan [name] reported that she is scheduled to meet with neurologist following week to discuss her recent blood clot, and she hopes it will continue shrinking.  Clinician inquired about Meghan [name]'s use of boundaries with her daughter to reduce stress and shift focus towards her own needs during treatment.  Meghan [name] reported that she has been resisting the urge to 'rescue' her daughter in times of crisis by abstaining from picking up phone calls immediately and challenging irrational thoughts.  Clinician worked with Meghan [name] to analyze recent situations when she received unexpected phone calls in an effort to identify changes in her ways of thinking and behaving following discussions on CBT principles such as thought replacement and irrational thinking.  Meghan [name] reported that her aunt and daughter called unexpectedly on the same day over the weekend, and her first thought upon hearing her aunt's ring tone was "Something is wrong", which made her feel sad, anxious, scared, and nervous.  Meghan [name] reported that she acknowledged this thought, but challenged herself to replace it with something more optimistic like "Maybe she's just checking in on me", which made her feel calm and relieved instead.  Meghan [name] reported that this turned  out to be  the case when she eventually called her back, and she kept this in mind when her daughter called her x3 on a row later that day.  This time, Meghan [name] reported that her first thought was that her daughter must be checking in, and when she did eventually call back, her daughter confirmed this, and Meghan [name] was able to show improved boundaries by telling her she would call her back later, since she had been in the middle of writing.  Clinician praised Meghan [name] on her progress, and inquired about any other areas of concern.  Meghan [name] reported that she has been feeling increased desire to socialize, but remains afraid of being around too many people and contracting COVID.  Clinician and Meghan [name] discussed social activities which cater to her interests, such as looking into safe exercise programs at the [facility] nearby, and taking virtual courses which will help her with networking and build skillset.  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  Clinician will continue to monitor.  Plan:Follow up again in 1 week virtually.   Diagnosis: Major depressive disorder, recurrent, moderate; PTSD  [name], LCSW, LCAS 11/16/19

## 2019-11-23 ENCOUNTER — Ambulatory Visit (INDEPENDENT_AMBULATORY_CARE_PROVIDER_SITE_OTHER): Payer: Medicare Other | Admitting: Licensed Clinical Social Worker

## 2019-11-23 ENCOUNTER — Other Ambulatory Visit: Payer: Self-pay

## 2019-11-23 DIAGNOSIS — F4310 Post-traumatic stress disorder, unspecified: Secondary | ICD-10-CM

## 2019-11-23 DIAGNOSIS — F331 Major depressive disorder, recurrent, moderate: Secondary | ICD-10-CM | POA: Diagnosis not present

## 2019-11-23 NOTE — Progress Notes (Signed)
Virtual Visit via Video Note   I connected with [name] on 11/23/19 at 2:00pm by [employer] video meeting and verified that I am speaking with the correct person using two identifiers.   I discussed the limitations, risks, security and privacy concerns of performing an evaluation and management service by telephone and the availability of in person appointments. I also discussed with the patient that there may be a patient responsible charge related to this service. The patient expressed understanding and agreed to proceed.   I discussed the assessment and treatment plan with the patient. The patient was provided an opportunity to ask questions and all were answered. The patient agreed with the plan and demonstrated an understanding of the instructions.   The patient was advised to call back or seek an in-person evaluation if the symptoms worsen or if the condition fails to improve as anticipated.   I provided 1 hour of non-face-to-face time during this encounter.     [name], LCSW, LCAS _______________________ THERAPIST PROGRESS NOTE   Session Time: 2:00pm - 3:00pm   Participation Level: Active    Behavioral Response: Alert, casually dressed, depressed mood/affect   Type of Therapy:  Individual Therapy   Treatment Goals addressed: Anxiety management; Self-care routine; Exercise routine; Socialization with family; Healthy boundaries; Writing progress   Interventions: CBT and DBT   Summary: Shanayah Kaffenberger is a 54 year old African American female that presented for virtual therapy appointment today with Major Depressive Disorder, recurrent, moderate, and PTSD.      Suicidal/Homicidal: None; without plan or intent    Therapist Response: Clinician met with [place] for virtual session via Webex.   Clinician assessed for safety and medication compliance.  Cathern presented to session on time, alert, oriented x5, with no evidence of SI/HI or A/V H.  Denette reported compliance with medication.   Clinician inquired about Maressa's current emotional ratings, as well as any significant changes in thoughts, feelings, or behavior since previous conversation.  Gayla reported scores of 5/10 for both depression and anxiety today and stated "Yesterday I got into an argument with one of my daughters and it hurt me really bad".  She reported that she and her daughter had a disagreement about an upcoming visit while talking on the phone, and she overheard her daughter trying to manipulate the granddaughter into saying things that would make Fina sad.  Gloria reported that she ended the phone call, but her daughter sent videos of the granddaughter saying similar things, which upset Welma enough that she had to call her pastor for emotional support.  Clinician praised Alyssabeth for outreaching supports in state of distress and inquired about how long communication patterns like this have persisted between [name] and her daughter, as well as additional strategies her daughter has used in an attempt to manipulate.  Torryn reported that her daughter has been like this since she was young, using guilt trips to get what she wants, not taking "No" for an answer, and attempting to split [place] against other family members like her ex-husband.  Clinician explained DEAR MAN (DBT) technique for improved communication to [place] today, involving Describing the facts, Expressing oneself through "I" statements, Asserting what you want or need clearly, Reinforcing positive behavior from the other party, being Mindful of goals to avoid being sidetracked, Appearing confident with body language, and Negotiating fairly, including compromising to reach mutual agreement.  Porsha reported that this was a helpful strategy to discuss and she would attempt to use new approach  when dealing with this daughter, but continue ending any conversations where the daughter will not respect boundaries or regress to previous tactics. Clinician also inquired about  Chikita's self-care plan for improving mood this week following recent stressor.  Reilynn reported that she has fallen behind on her writing routine in past days, and will aim for 2-3 hours spent on novel daily, as well as going on walks for exercise, engaging in choir at church, and volunteering for the soup kitchen.  Clinician will continue to monitor.                            Plan: Follow up again in 1 week virtually.    Diagnosis: Major depressive disorder, recurrent, moderate; PTSD    [name], LCSW, LCAS 11/23/19

## 2019-11-27 ENCOUNTER — Emergency Department (HOSPITAL_COMMUNITY): Payer: Medicare Other

## 2019-11-27 ENCOUNTER — Emergency Department (HOSPITAL_COMMUNITY)
Admission: EM | Admit: 2019-11-27 | Discharge: 2019-11-27 | Disposition: A | Discharge status: Home / Self Care | Payer: Medicare Other | Attending: Emergency Medicine | Admitting: Emergency Medicine

## 2019-11-27 DIAGNOSIS — Z79899 Other long term (current) drug therapy: Secondary | ICD-10-CM | POA: Diagnosis not present

## 2019-11-27 DIAGNOSIS — R072 Precordial pain: Secondary | ICD-10-CM

## 2019-11-27 DIAGNOSIS — I4891 Unspecified atrial fibrillation: Secondary | ICD-10-CM | POA: Diagnosis not present

## 2019-11-27 DIAGNOSIS — I509 Heart failure, unspecified: Secondary | ICD-10-CM | POA: Diagnosis not present

## 2019-11-27 DIAGNOSIS — R0789 Other chest pain: Secondary | ICD-10-CM | POA: Diagnosis present

## 2019-11-27 DIAGNOSIS — Z7901 Long term (current) use of anticoagulants: Secondary | ICD-10-CM | POA: Diagnosis not present

## 2019-11-27 LAB — CBC WITH DIFFERENTIAL/PLATELET
Abs Immature Granulocytes: 0.01 10*3/uL (ref 0.00–0.07)
Basophils Absolute: 0 10*3/uL (ref 0.0–0.1)
Basophils Relative: 0 %
Eosinophils Absolute: 0.1 10*3/uL (ref 0.0–0.5)
Eosinophils Relative: 1 %
HCT: 37.6 % (ref 36.0–46.0)
Hemoglobin: 11.6 g/dL — ABNORMAL LOW (ref 12.0–15.0)
Immature Granulocytes: 0 %
Lymphocytes Relative: 27 %
Lymphs Abs: 2.2 10*3/uL (ref 0.7–4.0)
MCH: 26 pg (ref 26.0–34.0)
MCHC: 30.9 g/dL (ref 30.0–36.0)
MCV: 84.3 fL (ref 80.0–100.0)
Monocytes Absolute: 0.6 10*3/uL (ref 0.1–1.0)
Monocytes Relative: 7 %
Neutro Abs: 5.3 10*3/uL (ref 1.7–7.7)
Neutrophils Relative %: 65 %
Platelets: 255 10*3/uL (ref 150–400)
RBC: 4.46 MIL/uL (ref 3.87–5.11)
RDW: 14.4 % (ref 11.5–15.5)
WBC: 8.2 10*3/uL (ref 4.0–10.5)
nRBC: 0 % (ref 0.0–0.2)

## 2019-11-27 LAB — COMPREHENSIVE METABOLIC PANEL
ALT: 12 U/L (ref 0–44)
AST: 14 U/L — ABNORMAL LOW (ref 15–41)
Albumin: 3.5 g/dL (ref 3.5–5.0)
Alkaline Phosphatase: 213 U/L — ABNORMAL HIGH (ref 38–126)
Anion gap: 8 (ref 5–15)
BUN: 16 mg/dL (ref 6–20)
CO2: 27 mmol/L (ref 22–32)
Calcium: 10 mg/dL (ref 8.9–10.3)
Chloride: 105 mmol/L (ref 98–111)
Creatinine, Ser: 0.89 mg/dL (ref 0.44–1.00)
GFR calc Af Amer: >60 mL/min (ref >60)
GFR calc non Af Amer: >60 mL/min (ref >60)
Glucose, Bld: 91 mg/dL (ref 70–99)
Potassium: 4.4 mmol/L (ref 3.5–5.1)
Sodium: 140 mmol/L (ref 135–145)
Total Bilirubin: 0.5 mg/dL (ref 0.3–1.2)
Total Protein: 7.6 g/dL (ref 6.5–8.1)

## 2019-11-27 LAB — BRAIN NATRIURETIC PEPTIDE: B Natriuretic Peptide: 50 pg/mL (ref 0.0–100.0)

## 2019-11-27 LAB — TROPONIN I (HIGH SENSITIVITY)
Troponin I (High Sensitivity): <2 ng/L (ref <18)
Troponin I (High Sensitivity): <2 ng/L (ref <18)

## 2019-11-27 MED ORDER — MORPHINE SULFATE (PF) 4 MG/ML IV SOLN
4.0000 mg | Freq: Once | INTRAVENOUS | Status: AC
  Administered 2019-11-27: 16:00:00 4 mg via INTRAVENOUS
  Filled 2019-11-27: qty 1

## 2019-11-27 MED ORDER — DICLOFENAC SODIUM 1 % EX GEL: 2.0000 g | Freq: Four times a day (QID) | CUTANEOUS | 0 refills | Status: DC | PRN

## 2019-11-27 NOTE — Discharge Instructions (Signed)
You were seen in the emergency department today with chest discomfort.  Your pain in the arm and neck seems more consistent with muscle strain.  Please use the topical pain medication prescribed for this pain.  If your chest discomfort worsens, you develop shortness of breath, numbness/weakness in the arms or legs you should return to the emergency department immediately.  Please call your cardiologist and primary care doctor on Monday to confirm your upcoming appointments.

## 2019-11-27 NOTE — ED Provider Notes (Signed)
Emergency Department Provider Note   I have reviewed the triage vital signs and the nursing notes.   HISTORY  Chief Complaint Chest Pain   HPI Meghan [name] is a 54 y.o. female with PMH of CHF and A-fib on Flecanide and Xarelto followed at [facility] by Dr. Shirlee More presents to the emergency department from her primary care physician's office complaining of chest pain.  Symptoms began this morning at approximately 10 AM.  Patient describes a "nagging" left sided pain radiating to the left shoulder and arm.  Her arm pain is worse with moving the arm and seems to radiate to the shoulder.  The pain is worse with touching the area but states that this does feel different than the pain she is experiencing in her chest.  She has no prior history of ACS.  She notes being compliant with her medication for CHF and A. fib.  She does not feel like she is retaining fluid.  She did feel somewhat lightheaded.  She had a regularly scheduled PCP appointment and mention the chest pain there and was immediately transferred to the ED.  With EMS the patient was given 324 mg of aspirin and 1 nitroglycerin after which she had an approximately 20 second episode of syncope. No change in pain with Nitro.    Past Medical History:  Diagnosis Date  . Arthritis    left knee  . Asthma   . Atrial fibrillation ([place])   . CHF (congestive heart failure) ([place]) 01/2019   Physician from [place] informed her she had this   . Dysrhythmia    Atrial fibrillation  . History of pulmonary embolus ([place])   . Macular degeneration    of left eye  . Pneumonia    in the past  . Seizure disorder ([place]) 2017  . Sleep apnea   . Stroke [facility])    1989 and 1995 (left sided weakness)    There are no problems to display for this patient.   Past Surgical History:  Procedure Laterality Date  . ABDOMINAL HYSTERECTOMY  2003   complete  . CARDIAC CATHETERIZATION  2017   in [facility] in [place]  . CESAREAN SECTION   [id number]   x 2  . CESAREAN SECTION     x 2  . CHOLECYSTECTOMY  2003  . COLONOSCOPY WITH PROPOFOL N/A 06/05/2019   Procedure: COLONOSCOPY WITH PROPOFOL;  Surgeon: Carol Ada, MD;  Location: [facility];  Service: Endoscopy;  Laterality: N/A;  . HERNIA REPAIR  [id number], [id number]   umbilical hernia repair  . IVC FILTER INSERTION  2003   Hx pulmonary embolus  . POLYPECTOMY  06/05/2019   Procedure: POLYPECTOMY;  Surgeon: Carol Ada, MD;  Location: [facility];  Service: Endoscopy;;  . SHOULDER ARTHROSCOPY W/ ROTATOR CUFF REPAIR  08/28/2017   right shoulder    Allergies Other, Acetaminophen-codeine, Coconut oil, and Tomato  No family history on file.  Social History Social History   Tobacco Use  . Smoking status: Never Smoker  . Smokeless tobacco: Never Used  Substance Use Topics  . Alcohol use: Never  . Drug use: Never    Review of Systems  Constitutional: No fever/chills Eyes: No visual changes. ENT: No sore throat. Cardiovascular: Positive chest pain and lightheadedness with syncope after Nitro.  Respiratory: Denies shortness of breath. Gastrointestinal: No abdominal pain.  No nausea, no vomiting.  No diarrhea.  No constipation. Genitourinary: Negative for dysuria. Musculoskeletal: Negative for back pain. Skin: Negative for rash. Neurological: Negative  for headaches, focal weakness or numbness.  10-point ROS otherwise negative.  ____________________________________________   PHYSICAL EXAM:  VITAL SIGNS: ED Triage Vitals  Enc Vitals Group     BP 11/27/19 1543 119/69     Pulse Rate 11/27/19 1543 78     Resp 11/27/19 1543 17     Temp 11/27/19 1543 98.9 F (37.2 C)     Temp src --      SpO2 11/27/19 1535 98 %   Constitutional: Alert and oriented. Well appearing and in no acute distress. Eyes: Conjunctivae are normal. Head: Atraumatic. Nose: No congestion/rhinnorhea. Mouth/Throat: Mucous membranes are moist. . Neck: No stridor. No midline c-spine  tenderness.  Cardiovascular: Normal rate, regular rhythm. Good peripheral circulation. Grossly normal heart sounds.   Respiratory: Normal respiratory effort.  No retractions. Lungs CTAB. Gastrointestinal: Soft and nontender. No distention.  Musculoskeletal: Tenderness to palpation along the left posterior shoulder and pain with passive range of motion of the left arm.  Neurologic:  Normal speech and language. Normal grip strength and sensation.  Skin:  Skin is warm, dry and intact. No rash noted.   ____________________________________________   LABS (all labs ordered are listed, but only abnormal results are displayed)  Labs Reviewed  COMPREHENSIVE METABOLIC PANEL - Abnormal; Notable for the following components:      Result Value   AST 14 (*)    Alkaline Phosphatase 213 (*)    All other components within normal limits  CBC WITH DIFFERENTIAL/PLATELET - Abnormal; Notable for the following components:   Hemoglobin 11.6 (*)    All other components within normal limits  BRAIN NATRIURETIC PEPTIDE  TROPONIN I (HIGH SENSITIVITY)  TROPONIN I (HIGH SENSITIVITY)   ____________________________________________  EKG   EKG Interpretation  Date/Time:  Friday November 27 2019 15:45:48 EST Ventricular Rate:  80 PR Interval:    QRS Duration: 88 QT Interval:  379 QTC Calculation: 438 R Axis:   63 Text Interpretation: Sinus rhythm Prolonged PR interval Consider right ventricular hypertrophy Nonspecific T abnrm, anterolateral leads No STEMI Confirmed by Nanda Quinton [phone]) on 11/27/2019 3:55:28 PM       ____________________________________________  RADIOLOGY  DG Chest Portable 1 View  Result Date: 11/27/2019 CLINICAL DATA:  Chest pain with abnormal electrocardiogram EXAM: PORTABLE CHEST 1 VIEW COMPARISON:  September 03, 2019 FINDINGS: Lungs are clear. There is cardiomegaly with pulmonary vascularity normal. No adenopathy. No bone lesions. No pneumothorax. IMPRESSION: Stable cardiac  prominence.  No edema or consolidation. Electronically Signed   By: Lowella Grip III M.D.   On: 11/27/2019 16:26    ____________________________________________   PROCEDURES  Procedure(s) performed:   Procedures  None  ____________________________________________   INITIAL IMPRESSION / ASSESSMENT AND PLAN / ED COURSE  Pertinent labs & imaging results that were available during my care of the patient were reviewed by me and considered in my medical decision making (see chart for details).   Patient presents to the emergency department for evaluation of CP.  Pain in the shoulder is reproducible the patient states that the pain in her chest feels different and I am not able to reproduce this with palpation of the chest.  No hypoxemia or other abnormal vital signs.  No fevers or chills.  Patient with HEART score of 4.   07:00 PM  Second troponin is less than 2.  Suspect the patient's pain is mostly musculoskeletal.  Patient has outpatient follow-up already scheduled with her cardiologist she believes for next week.  I will have her call on  Monday to confirm this.  We discussed ED return precautions including worsening chest pain, shortness of breath, numbness/weakness.  Patient is comfortable with plan at discharge.  Voltaren gel prescribed for the neck and arm discomfort which is very reproducible on exam with palpation over the left trapezius and movement of the left arm.  ____________________________________________  FINAL CLINICAL IMPRESSION(S) / ED DIAGNOSES  Final diagnoses:  Precordial chest pain     MEDICATIONS GIVEN DURING THIS VISIT:  Medications  morphine 4 MG/ML injection 4 mg (4 mg Intravenous Given 11/27/19 1624)     NEW OUTPATIENT MEDICATIONS STARTED DURING THIS VISIT:  New Prescriptions   DICLOFENAC SODIUM (VOLTAREN) 1 % GEL    Apply 2 g topically 4 (four) times daily as needed (pain).    Note:  This document was prepared using Dragon voice recognition  software and may include unintentional dictation errors.  Nanda Quinton, MD, [facility] Emergency Medicine    Quamel Fitzmaurice, [facility], MD 11/27/19 [phone]

## 2019-11-27 NOTE — ED Triage Notes (Signed)
Pt from urgent care via EMS for chest pain that developed this AM after waking. States the CP is constant. Given 324 aspirin at UC and 1 dose of nitro after which pt had a syncopal episode that lasted approx 20 seconds. Did not injure self during syncopal episode.

## 2019-11-30 ENCOUNTER — Ambulatory Visit (INDEPENDENT_AMBULATORY_CARE_PROVIDER_SITE_OTHER): Payer: Medicare Other | Admitting: Licensed Clinical Social Worker

## 2019-11-30 ENCOUNTER — Other Ambulatory Visit: Payer: Self-pay

## 2019-11-30 DIAGNOSIS — F331 Major depressive disorder, recurrent, moderate: Secondary | ICD-10-CM

## 2019-11-30 DIAGNOSIS — F4310 Post-traumatic stress disorder, unspecified: Secondary | ICD-10-CM
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
## 2019-12-07 ENCOUNTER — Ambulatory Visit (INDEPENDENT_AMBULATORY_CARE_PROVIDER_SITE_OTHER): Payer: Medicare Other | Admitting: Licensed Clinical Social Worker

## 2019-12-07 ENCOUNTER — Other Ambulatory Visit: Payer: Self-pay

## 2019-12-07 DIAGNOSIS — F4310 Post-traumatic stress disorder, unspecified: Secondary | ICD-10-CM | POA: Diagnosis not present

## 2019-12-07 DIAGNOSIS — F331 Major depressive disorder, recurrent, moderate: Secondary | ICD-10-CM
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
## 2019-12-14 ENCOUNTER — Other Ambulatory Visit: Payer: Self-pay

## 2019-12-14 ENCOUNTER — Ambulatory Visit (INDEPENDENT_AMBULATORY_CARE_PROVIDER_SITE_OTHER): Payer: Medicare Other | Admitting: Licensed Clinical Social Worker

## 2019-12-14 DIAGNOSIS — F331 Major depressive disorder, recurrent, moderate: Secondary | ICD-10-CM | POA: Diagnosis not present

## 2019-12-14 DIAGNOSIS — F4310 Post-traumatic stress disorder, unspecified: Secondary | ICD-10-CM | POA: Diagnosis not present

## 2019-12-14 NOTE — Progress Notes (Signed)
Virtual Visit via Video Note  I connected withRenee Woodon3/29/21at2:00pmby[employer] Webex video meetingand verified that I am speaking with the correct person using two identifiers.  I discussed the limitations, risks, security and privacy concerns of performing an evaluation and management service by telephone and the availability of in person appointments. I also discussed with the patient that there may be a patient responsible charge related to this service. The patient expressed understanding and agreed to proceed.  I discussed the assessment and treatment plan with the patient. The patient was provided an opportunity to ask questions and all were answered. The patient agreed with the plan and demonstrated an understanding of the instructions.  The patient was advised to call back or seek an in-person evaluation if the symptoms worsen or if the condition fails to improve as anticipated.  I provided 1 hour of non-face-to-face time during this encounter.   [name], LCSW, LCAS _______________________ THERAPIST PROGRESS NOTE  Session Time:2:00pm - 3:00pm  Participation Level:Active  Behavioral Response:Alert, casually dressed, euthymic mood/affect  Type of Therapy: Individual Therapy  Treatment Goals addressed:Anxietymanagement;Self-care routine;Exercise routine; Socialization with family; Healthyboundaries; Writing progress  Interventions:CBT  Summary:[name] is a 54 year old African American female thatpresented for virtual therapy appointment today with Major Depressive Disorder, recurrent, moderate, and PTSD.  Suicidal/Homicidal:None;without plan or intent  Therapist Response:Clinician met with [name] for virtual therapy session todayon Webex application. Clinician assessed for safetyand medication compliance. [name] presented to therapy session on time, and was alert, oriented x5, with no evidence of SI/HI or A/V H. Meghan [name]  reported that she remains compliant with medication at this time.  Clinicianinquired about [name]'s emotional ratings today, as well as any significant changes in thoughts, feelings, or behavior since last conversation.Meghan [name] reported scores of 0/10 for both depression and anxiety today, which is consistent with previous session, and stated "Its been a very good week".  Meghan [name] reported that she has been very productive, spending a lot of time helping out the Network engineer of her church with volunteer work, submitting the rough draft of her novel to [employer], maintaining healthier boundaries with her daughter, getting more rest each day, practicing relaxation skills daily, and taking walks in the park whenever the weather is nice.  Meghan [name] reported that her family is also checking in on her regularly, so she is more connected with them than ever, and socialization continues to improve while curbing isolation.  Clinician praised Meghan [name] for her progress and inquired about her next steps in publishing her book now that the rough draft is submitted.  Meghan [name] reported that she will be working on revisions next and has until June to accomplish this, so she will commit roughly 1-1.5 hours per days towards getting this done.  Clinician was supportive of this goal, and encouraged Meghan [name] to consider how she will use free time once book is completed to avoid boredom and continue healthy routine.  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  Meghan [name] reported that she has continued weighing pros and cons of decreasing frequency of therapy sessions due to progress she has made, but isn't completely ready yet.  Clinician empathized with Meghan [name] and discussed stages of change  model with her, with signs indicating that she is currently  in contemplation stage.  Clinician encouraged Meghan [name] to open up about her fears regarding transition so that risks could be properly assessed.  Meghan [name] reported that she worries that if there is too much time between sessions, she will become overwhelmed, stating "I feel like I lose 20lbs of emotional weight after we have sessions".  Clinician pointed out that Meghan [name] has shown good use of her coping skills and support network even when a previous session was cancelled, but ensured her that treatment would continue as normal in meantime until she feels more confident in her abilities.  Meghan [name] was thankful and stated "I think I'm changing, but I do need more time to grow".  Clinician will continue to monitor.  Plan:Follow up again in 1 week virtually.   Diagnosis: Major depressive disorder, recurrent, moderate; PTSD  Meghan [name], LCAS 12/14/19

## 2019-12-18 ENCOUNTER — Ambulatory Visit: Payer: Medicare Other | Attending: Internal Medicine

## 2019-12-18 DIAGNOSIS — Z23 Encounter for immunization: Secondary | ICD-10-CM

## 2019-12-18 NOTE — Progress Notes (Signed)
   Covid-19 Vaccination Clinic  Name:  Meghan [name]    MRN: [id number] DOB: 18-Jul-1966  12/18/2019  [name] was observed post Covid-19 immunization for 15 minutes without incident. She was provided with Vaccine Information Sheet and instruction to access the V-Safe system.   [name] was instructed to call 911 with any severe reactions post vaccine: Marland Kitchen Difficulty breathing  . Swelling of face and throat  . A fast heartbeat  . A bad rash all over body  . Dizziness and weakness   Immunizations Administered    Name Date Dose VIS Date Route   Pfizer COVID-19 Vaccine 12/18/2019 12:04 PM 0.3 mL 08/28/2019 Intramuscular   Manufacturer: [place]   Lot: [id number]   [place]: [id number]

## 2019-12-21 ENCOUNTER — Ambulatory Visit (INDEPENDENT_AMBULATORY_CARE_PROVIDER_SITE_OTHER): Payer: Medicare Other | Admitting: Licensed Clinical Social Worker

## 2019-12-21 ENCOUNTER — Other Ambulatory Visit: Payer: Self-pay

## 2019-12-21 DIAGNOSIS — F4310 Post-traumatic stress disorder, unspecified: Secondary | ICD-10-CM

## 2019-12-21 DIAGNOSIS — F331 Major depressive disorder, recurrent, moderate: Secondary | ICD-10-CM

## 2019-12-21 NOTE — Progress Notes (Signed)
Virtual Visit via Video Note  I connected withRenee Woodon4/5/21at2:00pmby[employer] Webex video meetingand verified that I am speaking with the correct person using two identifiers.  I discussed the limitations, risks, security and privacy concerns of performing an evaluation and management service by telephone and the availability of in person appointments. I also discussed with the patient that there may be a patient responsible charge related to this service. The patient expressed understanding and agreed to proceed.  I discussed the assessment and treatment plan with the patient. The patient was provided an opportunity to ask questions and all were answered. The patient agreed with the plan and demonstrated an understanding of the instructions.  The patient was advised to call back or seek an in-person evaluation if the symptoms worsen or if the condition fails to improve as anticipated.  I provided 45 minutes of non-face-to-face time during this encounter.   [name], LCSW, LCAS _______________________ THERAPIST PROGRESS NOTE  Session Time:2:00pm - 2:45pm  Participation Level:Active  Behavioral Response:Alert, casually dressed, euthymic mood/affect  Type of Therapy: Individual Therapy  Treatment Goals addressed:Anxietymanagement;Self-care routine;Exercise routine; Socialization with family; Healthyboundaries; Writing progress; Church engagement   Interventions:CBT  Summary:Meghan [name] is a 54 year old African American female thatpresented for virtual therapy appointment today with Major Depressive Disorder, recurrent, moderate, and PTSD.  Suicidal/Homicidal:None;without plan or intent  Therapist Response:Clinician met with Meghan [name] for Webex therapy session today. Clinician assessed for safetyand medication compliance. Meghan [name] presented for appointment on time and was alert, oriented x5, with no evidence of SI/HI or A/V H. Meghan [name] reported  that she has continued to take medication as prescribed.  Clinicianinquired about Meghan [name]'s current emotional ratings, as well as any significant changes in thoughts, feelings, or behavior since previous session.Meghan [name] reported scores of 0/10 for both depression and anxiety today, which is consistent with last session, and stated "I'm still feeling pretty good". Meghan [name] reported that the past 3 days have been extremely busy for her, as she attended 2 doctor's appointments Friday, volunteered at the church soup kitchen Saturday, along with dance practice, and helped with easter activities for the children.  Meghan [name] reported that it was nice to give back to her community, but she admitted to been 'worn out' afterward.  Meghan [name] reported that she also ended up spending time with her sister on Sunday relaxing and watched 3 movies back to back with her.  Clinician was supportive of Meghan [name]'s involvement with spiritual community and positive family, and inquired about other areas of progress, including revising her book, exercise, sleep, and boundaries with toxic family members.  Meghan [name] reported that she has not written as much due to her busy schedule, but plans to catch up this week.  She reported that she is walking 30 minutes to 1 hour almost each day and is getting out more overall due to pleasant weather, which her doctor was supportive of.  Meghan [name] reported that due to how productive she has been, she has been sleeping 8-10 hours on average most nights and feels more rested each day.  Meghan [name] reported that she is finding it easier to reinforce healthy boundaries with family and stated "I told them I'd call them if I need something.  They know to follow my rules, and that has helped with stress a lot".  Clinician praised Meghan [name] for her progress and inquired about whether she faced any challenges in past week.  Meghan [name] reported that there was only one instance on Wednesday where an aunt called unexpectedly after Meghan [name] had watched a  distressing  story on the news, and this led her to panic a bit.  Clinician applied CBT model to this event with Meghan [name] to determine the triggering event, and related thoughts and feelings that resulted, along with alternative approaches that could have been implemented to reduce sense of growing distress.  Meghan [name] reported that bad news can be a trigger for her, so when she picked up the phonecall from a family member she hadn't heard from in awhile, the default thought was "Something is wrong", which led to feelings of anxiety, fear, and panic.  Meghan [name] reported that this also led to physiological signs like shaking hands, a headache, and increased heartrate.  Meghan [name] reported that when she learned that this call was about something inconsequential, she calmed down by washing her face with cold water, laying on her bed, closing her eyes, and then practicing deep breathing.  Meghan [name] reported that she would try to slow down thinking more often to disrupt pattern of catastrophizing.  Clinician encouraged Meghan [name] to continue regular practice of relaxation techniques to aid in this goal, including grounding techniques to temporarily distract her from troubling thoughts and feelings if previous approach proves ineffective.  Clinician inquired about Meghan [name]'s plans for today to maintain productivity.  Meghan [name] reported that she would work on her book revision a bit, and then go outdoors again to socialize with her neighbors a bit.  Clinician was supportive of this plan and will continue to monitor.    Plan:Follow up again in 1 week virtually.   Diagnosis: Major depressive disorder, recurrent, moderate; PTSD  [place] [name], LCAS 12/21/19

## 2019-12-28 ENCOUNTER — Ambulatory Visit (INDEPENDENT_AMBULATORY_CARE_PROVIDER_SITE_OTHER): Payer: Medicare Other | Admitting: Licensed Clinical Social Worker

## 2019-12-28 ENCOUNTER — Other Ambulatory Visit: Payer: Self-pay

## 2019-12-28 DIAGNOSIS — F331 Major depressive disorder, recurrent, moderate: Secondary | ICD-10-CM

## 2019-12-28 DIAGNOSIS — F4310 Post-traumatic stress disorder, unspecified: Secondary | ICD-10-CM
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
## 2020-01-04 ENCOUNTER — Other Ambulatory Visit: Payer: Self-pay

## 2020-01-04 ENCOUNTER — Ambulatory Visit (INDEPENDENT_AMBULATORY_CARE_PROVIDER_SITE_OTHER): Payer: Medicare Other | Admitting: Licensed Clinical Social Worker

## 2020-01-04 DIAGNOSIS — F4310 Post-traumatic stress disorder, unspecified: Secondary | ICD-10-CM | POA: Diagnosis not present

## 2020-01-04 DIAGNOSIS — F331 Major depressive disorder, recurrent, moderate: Secondary | ICD-10-CM

## 2020-01-04 NOTE — Progress Notes (Signed)
Virtual Visit via Video Note   I connected with Meghan [name] on 01/04/20 at 2:00pm by [employer] video meeting and verified that I am speaking with the correct person using two identifiers.   I discussed the limitations, risks, security and privacy concerns of performing an evaluation and management service by telephone and the availability of in person appointments. I also discussed with the patient that there may be a patient responsible charge related to this service. The patient expressed understanding and agreed to proceed.   I discussed the assessment and treatment plan with the patient. The patient was provided an opportunity to ask questions and all were answered. The patient agreed with the plan and demonstrated an understanding of the instructions.   The patient was advised to call back or seek an in-person evaluation if the symptoms worsen or if the condition fails to improve as anticipated.   I provided 1 hour of non-face-to-face time during this encounter.     [name], LCSW, LCAS _______________________ THERAPIST PROGRESS NOTE   Session Time: 2:00pm - 3:00pm   Participation Level: Active    Behavioral Response: Alert, casually dressed, anxiety mood/affect   Type of Therapy:  Individual Therapy   Treatment Goals addressed: Anxiety management; Self-care routine; Socialization with family; Healthy boundaries; Exercise; Writing progress; Church engagement    Interventions: CBT, solution focused   Summary: Meghan [name] is a 54 year old African American female that presented for virtual therapy appointment today with Major Depressive Disorder, recurrent, moderate, and PTSD.      Suicidal/Homicidal: None; without plan or intent    Therapist Response: Clinician met with Meghan [name] for Webex virtual session today.  Clinician assessed for safety and medication compliance.  Meghan [name] presented for session on time and was alert, oriented x5, with no evidence of SI/HI or A/V H.  Meghan [name] reported  ongoing compliance with medication.  Clinician inquired about Meghan [name]'s current emotional ratings, as well as any significant changes in thoughts, feelings, or behavior since previous appointment.  Meghan [name] reported scores of 0/10 for depression and 10/10 for anxiety today, stating "I'm worried about getting my rapid COVID test tomorrow".  Meghan [name] reported that she is worried the test will come back positive and prevent her from [place] home to participate in her granddaughter's birthday.  Clinician inquired about progress Meghan [name] has made in past week towards goals, as well as present barriers.  Meghan [name] reported that she has stayed busy in past weekend attending and volunteering for her church, which has offered excellent exercise and socialization.  Meghan [name] reported that she has been balancing this with rest when needed, and practicing healthier boundaries by saying 'No' to unreasonable demands from supports.  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  Clinician revisited Meghan [name]'s initial concerns regarding her COVID test, and inquired about what her specific worries are in an effort to better understand problem, as well as potential solutions depending upon outcome of this test.  Meghan [name] was agreeable to processing this, and reported that she finds herself struggling with "What ifs" often, such as testing positive for COVID despite lack of symptoms, and having to cancel her trip, which she worries would let down her family.  Upon discussing the worst and best possible outcomes for this situation, Meghan [name] acknowledged that if she tests positive, she has a few days to re-test so that she can still travel, and  even if she tests positive twice, she can attend the birthday virtually, and schedule another trip in a few weeks.  Meghan [name] also reported that she will try to create a schedule to follow while she is on  the trip to avoid juggling too many responsibilities at once without any structure in place.  Meghan [name] reported that she felt better upon processing this and plans to pray about the outcome as well for additional spiritual support.  She stated "Its all a part of god's plan, whatever happens".  Clinician will continue to monitor.                Plan: Follow up again in 1 week virtually.    Diagnosis: Major depressive disorder, recurrent, moderate; PTSD    [name], LCSW, LCAS 01/04/20

## 2020-01-11 ENCOUNTER — Ambulatory Visit (HOSPITAL_COMMUNITY): Payer: Medicare Other | Admitting: Licensed Clinical Social Worker

## 2020-01-13 ENCOUNTER — Other Ambulatory Visit: Payer: Self-pay

## 2020-01-13 ENCOUNTER — Ambulatory Visit (INDEPENDENT_AMBULATORY_CARE_PROVIDER_SITE_OTHER): Payer: Medicare Other | Admitting: Licensed Clinical Social Worker

## 2020-01-13 DIAGNOSIS — F4310 Post-traumatic stress disorder, unspecified: Secondary | ICD-10-CM | POA: Diagnosis not present

## 2020-01-13 DIAGNOSIS — F331 Major depressive disorder, recurrent, moderate: Secondary | ICD-10-CM | POA: Diagnosis not present

## 2020-01-13 NOTE — Progress Notes (Signed)
Virtual Visit via Video Note  I connected withRenee Woodon4/28/21at2:00pmby[employer] Webex video meetingand verified that I am speaking with the correct person using two identifiers.  I discussed the limitations, risks, security and privacy concerns of performing an evaluation and management service by telephone and the availability of in person appointments. I also discussed with the patient that there may be a patient responsible charge related to this service. The patient expressed understanding and agreed to proceed.  I discussed the assessment and treatment plan with the patient. The patient was provided an opportunity to ask questions and all were answered. The patient agreed with the plan and demonstrated an understanding of the instructions.  The patient was advised to call back or seek an in-person evaluation if the symptoms worsen or if the condition fails to improve as anticipated.  I provided30 minutesof non-face-to-face time during this encounter.   [name], LCSW, LCAS _______________________ THERAPIST PROGRESS NOTE  Session Time:2:00pm - 2:30pm  Participation Level:Active  Behavioral Response:Alert, casually dressed, anxiety mood/affect  Type of Therapy: Individual Therapy  Treatment Goals addressed:Anxietymanagement;Medical appointments; Self-care routine;Socialization with family; Healthyboundaries; Exercise; Writing progress; Church engagement  Interventions:CBT  Summary:[name] is a 54 year old African American female thatpresented for virtual therapy appointment today with Major Depressive Disorder, recurrent, moderate, and PTSD.  Suicidal/Homicidal:None;without plan or intent  Therapist Response:Clinician met with Meghan [name] for [employer] virtual session.  Clinician assessed for safety and medication compliance.  [name] presented for appointment on time and was alert, oriented x5, with no evidence of SI/HI or A/V H.  [name]  reported that she continues taking medication as prescribed.  Clinician inquired about Meghan [name]'s emotional ratings today, as well as any significant changes in thoughts, feelings, or behavior since last session.  [name] reported scores of 0/10 for depression and 5/10 for anxiety today, stating "I was able to make my trip and everything went well with the party for my grandchild".  Clinician inquired about progress Meghan [name] has made since last session towards goals, as well as present challenges she is facing.  Meghan [name] reported that her flight back home to visit family went well, and she did not end up testing positive for COVID, so this previous worry did not come to fruition, reinforcing the importance of challenging her ruminating thoughts.  She also reported that she was able to socialize with several family members she has not seen in some time, and got a lot of exercise from walking around the city.  Meghan [name] reported that she was able to successfully use relaxation techniques on her trip to stay calm, such as breathing, counting her breaths, and focusing on things to be grateful for.  She reported that she was also able to maintain boundaries with a particular family member that 'tries to start drama' to avoid this impacting her positive mood.  Meghan [name] reported that she was able to attend church meetings virtually on the trip to maintain spiritual and community support.  Clinician praised Meghan [name] for her progress and inquired about her plans for the following week to maintain positive mood and success towards treatment goals.  Meghan [name] reported that she has two medical appointments in the upcoming week, including her second COVID vaccine, and a check-up with her pulmonologist.  She reported that she also needs to keep up with bible study and soup kitchen volunteering at her church, and go outside to walk early in the morning to maintain exercise routine.  Clinician inquired about Meghan [name]'s progress towards completing book  revisions at this time so that  she can publish on time and avoid procrastination.  Meghan [name] admitted that she has been so focused on family that she neglected working on this goal as much.  She reported that she would prioritize this in upcoming days, and dedicate 1-2 hours per day towards completion so that she will not reach end of deadline in a stressed stated, race to finish, and feel unsatisfied with results.  Clinician was supportive of plan and will continue to monitor.                 Plan:Follow up again in 1 week virtually.   Diagnosis: Major depressive disorder, recurrent, moderate; PTSD  Meghan [name], LCAS 01/13/20

## 2020-01-18 ENCOUNTER — Ambulatory Visit: Payer: Medicare Other | Attending: Internal Medicine

## 2020-01-18 DIAGNOSIS — Z23 Encounter for immunization: Secondary | ICD-10-CM

## 2020-01-18 NOTE — Progress Notes (Signed)
   Covid-19 Vaccination Clinic  Name:  Meghan [name]    MRN: [id number] DOB: [birth year]  01/18/2020  Meghan [name] was observed post Covid-19 immunization for 15 minutes without incident. She was provided with Vaccine Information Sheet and instruction to access the V-Safe system.   Meghan [name] was instructed to call 911 with any severe reactions post vaccine: Marland Kitchen Difficulty breathing  . Swelling of face and throat  . A fast heartbeat  . A bad rash all over body  . Dizziness and weakness   Immunizations Administered    Name Date Dose VIS Date Route   Pfizer COVID-19 Vaccine 01/18/2020  8:27 AM 0.3 mL 11/11/2018 Intramuscular   Manufacturer: [place]   Lot: [id number]   [place]: [id number]

## 2020-01-20 ENCOUNTER — Ambulatory Visit (INDEPENDENT_AMBULATORY_CARE_PROVIDER_SITE_OTHER): Payer: Medicare Other | Admitting: Licensed Clinical Social Worker

## 2020-01-20 ENCOUNTER — Other Ambulatory Visit: Payer: Self-pay

## 2020-01-20 DIAGNOSIS — F331 Major depressive disorder, recurrent, moderate: Secondary | ICD-10-CM

## 2020-01-20 DIAGNOSIS — F4310 Post-traumatic stress disorder, unspecified: Secondary | ICD-10-CM

## 2020-01-20 NOTE — Progress Notes (Signed)
Virtual Visit via Video Note   I connected with [name] on 01/20/20 at 2:00pm by [employer] meeting and verified that I am speaking with the correct person using two identifiers.   I discussed the limitations, risks, security and privacy concerns of performing an evaluation and management service by telephone and the availability of in person appointments. I also discussed with the patient that there may be a patient responsible charge related to this service. The patient expressed understanding and agreed to proceed.   I discussed the assessment and treatment plan with the patient. The patient was provided an opportunity to ask questions and all were answered. The patient agreed with the plan and demonstrated an understanding of the instructions.   The patient was advised to call back or seek an in-person evaluation if the symptoms worsen or if the condition fails to improve as anticipated.   I provided 1 hour of non-face-to-face time during this encounter.     [name], LCSW, LCAS _______________________ THERAPIST PROGRESS NOTE   Session Time: 2:00pm - 3:00pm   Participation Level: Active    Behavioral Response: Alert, casually dressed, depressed mood/affect   Type of Therapy:  Individual Therapy   Treatment Goals addressed: Anxiety management; Medical appointments; Self-care routine; Socialization with family; Healthy boundaries and communication skills; Exercise; Writing progress; Church engagement   Interventions: CBT   Summary: Alaney Witter is a 54 year old African American female that presented for virtual therapy appointment today with Major Depressive Disorder, recurrent, moderate, and PTSD.      Suicidal/Homicidal: None; without plan or intent    Therapist Response: Clinician met with Lavida for virtual Webex therapy session today.  Clinician assessed for safety and medication compliance.  Ahsha presented for session on time and was alert, oriented x5, with no  evidence of SI/HI or A/V H.  Ladona reported ongoing compliance with medication.  Clinician inquired about Tiannah's current emotional ratings, as well as any significant changes in thoughts, feelings, or behavior since previous appointment.  Onika reported scores of 2/10 for depression and 3-4/10 for anxiety today, stating "My numbers were much higher an hour ago".  Clinician inquired about progress Irem has made in past week towards goals, as well as current challenges she is facing.  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  She reported low energy, but has been getting good exercise and was able to also get her second COVID vaccine without side effects.  She admitted that she has not been sleeping well in the past few nights and has also had some unusual dreams.  Clinician inquired about context of these dreams to determine whether there were any clues that might suggest underlying stressors.  Alex opened up about how the most recent dream related to her best friend, who suffered a massive stroke 2 years ago around this same date.  Janeice reported that she had not realized the significance of this until bringing it up today.  Clinician encouraged Elain to be mindful of particular dates and/or seasons which might trigger mood/behavior shifts and try planning ahead in the future to implement a coping plan if necessary.  Daejah acknowledged that this could have influenced her earlier spike in anger, as she had been preparing for a few days to dance at church, only to receive  a phone call informing her that someone else's daughter would be stepping in instead.  Clinician inquired about how this impacted Teleshia's emotional state in the moment, and virtually shared a handout on 'the anger iceberg', which displayed how various underlying  emotions can be linked without initially realizing it.  Khaya reported that at first she was angry, but also felt hurt, sad, frustrated, threatened, disappointed, and stressed out.  Clinician praised Joneisha for her insight, and inquired about how she coped with these feelings in the moment without reacting impulsively.  Ita reported that in the past, she "Would have cussed someone out", but she was instead able to end the call, and spoke with some members of the church, which helped her normalize these feelings and move on.  Clinician inquired about whether Alin felt that the issue between her and this clergy member was resolved, or if practicing assertive communication skills might be warranted to express herself directly.  Ottie stated "I'm not feeling like I was before.  Talking about it some more made me feel even better.  I think its best just to let it go".  Clinician praised Stacee for her willingness to forgive and discuss her feelings today, encouraging her to continue using support system to adapt to day to day challenges.  Porshe reported that "I'm starting to let people know how I feel.  I don't have to hide anymore".  She reported that she was also feeling tired today and admitted that this could have influenced anger, so she planned to get some rest after session.  Clinician will continue to monitor.                            Plan: Follow up again in 1 week virtually.    Diagnosis: Major depressive disorder, recurrent, moderate; PTSD    [name], LCSW, LCAS 01/20/20

## 2020-01-25 ENCOUNTER — Other Ambulatory Visit: Payer: Self-pay

## 2020-01-25 ENCOUNTER — Ambulatory Visit (INDEPENDENT_AMBULATORY_CARE_PROVIDER_SITE_OTHER): Payer: Medicare Other | Admitting: Licensed Clinical Social Worker

## 2020-01-25 DIAGNOSIS — F4310 Post-traumatic stress disorder, unspecified: Secondary | ICD-10-CM | POA: Diagnosis not present

## 2020-01-25 DIAGNOSIS — F331 Major depressive disorder, recurrent, moderate: Secondary | ICD-10-CM | POA: Diagnosis not present

## 2020-01-25 NOTE — Progress Notes (Signed)
Virtual Visit via Video Note  I connected withCayce Priegoon5/10/21at2:00pmbyCisco Webex video meetingand verified that I am speaking with the correct person using two identifiers.  I discussed the limitations, risks, security and privacy concerns of performing an evaluation and management service by telephone and the availability of in person appointments. I also discussed with the patient that there may be a patient responsible charge related to this service. The patient expressed understanding and agreed to proceed.  I discussed the assessment and treatment plan with the patient. The patient was provided an opportunity to ask questions and all were answered. The patient agreed with the plan and demonstrated an understanding of the instructions.  The patient was advised to call back or seek an in-person evaluation if the symptoms worsen or if the condition fails to improve as anticipated.  I provided1 hourof non-face-to-face time during this encounter.   Cory Bates, LCSW, LCAS _______________________ THERAPIST PROGRESS NOTE  Session Time:2:00pm -3:00pm  Participation Level:Active  Behavioral Response:Alert, casually dressed, euthymic mood/affect  Type of Therapy: Individual Therapy  Treatment Goals addressed:Anxietymanagement;Socialization with family; Healthyboundaries and communication skills; Exercise; Writing progress; Church engagement  Interventions:CBT, assertive communication skills  Summary:RosalindaBress is a 53 year old African American female thatpresented for virtual therapy appointment today with Major Depressive Disorder, recurrent, moderate, and PTSD.  Suicidal/Homicidal:None;without plan or intent  Therapist Response: Clinician met with Wessie for virtual therapy session today.  Haylie was unable to access Caregility meeting through Epic, so Webex was utilized instead.  Clinician assessed for safety and medication compliance.   Jailah presented for appointment on time and was alert, oriented x5, with no evidence of SI/HI or A/V H.  Marthena reported that she is taking medication as prescribed.  Clinician inquired about Johnda's emotional ratings today, as well as any significant changes in thoughts, feelings, or behavior since last session.  Sakura reported scores of 0/10 for both depression and anxiety today, stating "I've been doing okay, just busy".  Clinician inquired about areas of progress Leya has been focused on in past week, as well as present struggles.  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  Deeanna reported that mother's day went well, as she received several phone calls from family which lifted her spirits, and she was also able to spend the day having dinner with her sister and watching movies after church.  Candus reported that interpersonal issues with a member at her church are her biggest issue at this time, as she had previously been scheduled to dance for her congregation Sunday, but this was unexpectedly cancelled.  Jennice reported that after venting to the church secretary, her pastor has since become involved, and overrode the decision, allowing her to dance after all.  Macaila reported that she performed well and received many compliments, but was left feeling like she might be better off leaving the team, since it has led to more stress and anxiety.  Amylee reported that her pastor proposed a meeting today with several of the involved members and Mickelle to resolve this, and she was not sure how to approach this.  Clinician proposed revisiting topic of assertive communication skills today to prepare her for this meeting, so that Idalys could clearly express her wants and needs while taking into consideration the viewpoints of others in a manner that wasn't passive or aggressive.     Clinician covered various traits of assertive communication she could embody, such as maintaining eye contact, avoiding interruptions, maintaining appropriate volume and tone, and keeping confident body language and posture.  Mechel reported that she tends to avoid conflict unless she builds up enough stress and resentment towards those infringing on her rights, and she struggles with maintaining eye contact, and managing tone if she becomes upset enough.  Clinician covered some examples of assertive statements one could provide based upon an assortment of scenarios, and inquired about how Priya might express her feelings in a calm, rational manner later today.  Sugar reported that she would use her relaxation skills to remain calm, express her feelings on how their actions made her feel using "I" statements, and stated "I want them to know that I'm not there to take over, just learn to work with each other".  Maddalyn reported that she does not desire drama, and if they cannot resolve these differences, then she will withdraw from extracurricular activities like this and simply attend church virtually since this would help her maintain support with less stress.  Jatasia reported that this was helpful to discuss and she plans to take a walk to clear her mind before the meeting, as well as rehearse her approach to prepare further.  Clinician will continue to monitor.                           Plan:Follow up again in 1 week virtually.   Diagnosis: Major depressive disorder, recurrent, moderate; PTSD  [name], LCAS 56/10/21

## 2020-02-01 ENCOUNTER — Ambulatory Visit (INDEPENDENT_AMBULATORY_CARE_PROVIDER_SITE_OTHER): Payer: Medicare Other | Admitting: Licensed Clinical Social Worker

## 2020-02-01 ENCOUNTER — Other Ambulatory Visit: Payer: Self-pay

## 2020-02-01 DIAGNOSIS — F4310 Post-traumatic stress disorder, unspecified: Secondary | ICD-10-CM

## 2020-02-01 DIAGNOSIS — F331 Major depressive disorder, recurrent, moderate: Secondary | ICD-10-CM | POA: Diagnosis not present

## 2020-02-01 NOTE — Progress Notes (Signed)
Virtual Visit via Video Note  I connected withCarleta Ebleon5/17/21at2:00pmbyCisco Webex video meetingand verified that I am speaking with the correct person using two identifiers.  I discussed the limitations, risks, security and privacy concerns of performing an evaluation and management service by telephone and the availability of in person appointments. I also discussed with the patient that there may be a patient responsible charge related to this service. The patient expressed understanding and agreed to proceed.  I discussed the assessment and treatment plan with the patient. The patient was provided an opportunity to ask questions and all were answered. The patient agreed with the plan and demonstrated an understanding of the instructions.  The patient was advised to call back or seek an in-person evaluation if the symptoms worsen or if the condition fails to improve as anticipated.  I provided1 hour of non-face-to-face time during this encounter.   Meghan Bates, LCSW, LCAS _______________________ THERAPIST PROGRESS NOTE  Session Time:2:00pm -3:00pm  Participation Level:Active  Behavioral Response:Alert, casually dressed, euthymic mood/affect  Type of Therapy: Individual Therapy  Treatment Goals addressed:Anxietymanagement;Socialization with family; Healthyboundaries and communication skills; Exercise; Writing progress  Interventions:CBT, healthy boundaries and assertive communication skills  Summary:JensenTyndall is a 53 year old African American female thatpresented for virtual therapy appointment today with Major Depressive Disorder, recurrent, moderate, and PTSD.  Suicidal/Homicidal:None;without plan or intent  Therapist Response: Clinician met with Cande for virtual session via Webex today.  Clinician assessed for safety and medication compliance.  Meghan [name] presented for session on time and was alert, oriented x5, with no evidence of  SI/HI or A/V H.  Nolene reported ongoing compliance with medication.  Clinician inquired about Meghan [name]'s present emotional ratings, as well as any significant changes in thoughts, feelings, or behavior since previous conversation.  Meghan [name] reported scores of 0/10 for both depression and anxiety today, but stated "Both were at a 10 about an hour ago, but they are better now".  Clinician inquired about areas of progress Meghan [name] has made towards goals in past week, as well as present challenges to success.  Meghan [name] reported that she drove to [place] her sister for her aunt's birthday over the weekend, and this largely derailed her goals, as she did not get a chance to work on her book, but was able to get plenty of exercise due to how much walking she did.  Meghan [name] reported that she was able to attend an appointment with her doctor today, and will be having a physical done tomorrow.  She reported that she needs to improve her diet by eating on a more regular basis, following recommendation of 3 meals every 4 hours, with 3 snacks in between those to reach goal of 2200 calories per day and continued weight loss.  Clinician recommended that Meghan [name] document this in her weekly schedule and consider setting an alarm meal/snack reminder to aid in improving consistency, and she was receptive to this.  Clinician inquired about the event which led to Meghan [name]'s mood instability earlier today, and how she utilized coping skills to effectively bring this back down to 0 in severity.  Meghan [name] reported that her issues started at the birthday party on Saturday, as her aunt has notoriously been rude to her and acted in passive aggressive ways.  Meghan [name] reported that she gave her aunt 3 strikes, and avoided conflict by first walking away to breathe and count to 100, and once she returned and the rudeness continued, she opted to leave early, stating "I know what she wanted me to do,   and I didn't give in".  Clinician revisited topic of healthy boundaries  and communication skills with Meghan [name] today, highlighting her ability to regulate difficult emotions, use timeout when growing upset, and walking away from the situation without engaging further once boundaries continued to be disrespected.  Meghan [name] reported that her behavioral change was reinforced by positive family supports at this birthday and stated "People were proud of me".  [name] reported that the same aunt texted her today requesting that Meghan [name] never call or text her again, which initially set her anxiety off again, but then she calmed herself down, and sent a reply text back that was mature, and offered to fix things between them when the aunt is ready to be respectful as well.  [name] acknowledged that healthy relationships are built on mutual respect and she wishes to continue improving support network that reinforces more stable mental health for her.  Clinician will continue to monitor.                           Plan:Follow up again in 1 week virtually.   Diagnosis: Major depressive disorder, recurrent, moderate; PTSD  Meghan [name], LCAS 02/01/20

## 2020-02-02 ENCOUNTER — Other Ambulatory Visit: Payer: Self-pay

## 2020-02-02 ENCOUNTER — Encounter (HOSPITAL_COMMUNITY): Payer: Self-pay

## 2020-02-02 ENCOUNTER — Emergency Department (HOSPITAL_COMMUNITY): Payer: Medicare Other

## 2020-02-02 ENCOUNTER — Emergency Department (HOSPITAL_COMMUNITY)
Admission: EM | Admit: 2020-02-02 | Discharge: 2020-02-02 | Disposition: A | Discharge status: Home / Self Care | Payer: Medicare Other | Attending: Emergency Medicine | Admitting: Emergency Medicine

## 2020-02-02 DIAGNOSIS — Z86711 Personal history of pulmonary embolism: Secondary | ICD-10-CM | POA: Insufficient documentation

## 2020-02-02 DIAGNOSIS — Z79899 Other long term (current) drug therapy: Secondary | ICD-10-CM | POA: Insufficient documentation

## 2020-02-02 DIAGNOSIS — I69354 Hemiplegia and hemiparesis following cerebral infarction affecting left non-dominant side: Secondary | ICD-10-CM | POA: Insufficient documentation

## 2020-02-02 DIAGNOSIS — I4891 Unspecified atrial fibrillation: Secondary | ICD-10-CM | POA: Diagnosis not present

## 2020-02-02 DIAGNOSIS — Z7901 Long term (current) use of anticoagulants: Secondary | ICD-10-CM | POA: Insufficient documentation

## 2020-02-02 DIAGNOSIS — R112 Nausea with vomiting, unspecified: Secondary | ICD-10-CM | POA: Diagnosis not present

## 2020-02-02 DIAGNOSIS — I509 Heart failure, unspecified: Secondary | ICD-10-CM | POA: Insufficient documentation

## 2020-02-02 DIAGNOSIS — R109 Unspecified abdominal pain: Secondary | ICD-10-CM

## 2020-02-02 DIAGNOSIS — R1031 Right lower quadrant pain: Secondary | ICD-10-CM | POA: Diagnosis not present

## 2020-02-02 LAB — COMPREHENSIVE METABOLIC PANEL
ALT: 16 U/L (ref 0–44)
AST: 15 U/L (ref 15–41)
Albumin: 3.5 g/dL (ref 3.5–5.0)
Alkaline Phosphatase: 174 U/L — ABNORMAL HIGH (ref 38–126)
Anion gap: 7 (ref 5–15)
BUN: 15 mg/dL (ref 6–20)
CO2: 27 mmol/L (ref 22–32)
Calcium: 9.4 mg/dL (ref 8.9–10.3)
Chloride: 106 mmol/L (ref 98–111)
Creatinine, Ser: 0.75 mg/dL (ref 0.44–1.00)
GFR calc Af Amer: >60 mL/min (ref >60)
GFR calc non Af Amer: >60 mL/min (ref >60)
Glucose, Bld: 95 mg/dL (ref 70–99)
Potassium: 4 mmol/L (ref 3.5–5.1)
Sodium: 140 mmol/L (ref 135–145)
Total Bilirubin: 0.6 mg/dL (ref 0.3–1.2)
Total Protein: 7.4 g/dL (ref 6.5–8.1)

## 2020-02-02 LAB — CBC WITH DIFFERENTIAL/PLATELET
Abs Immature Granulocytes: 0.03 10*3/uL (ref 0.00–0.07)
Basophils Absolute: 0 10*3/uL (ref 0.0–0.1)
Basophils Relative: 0 %
Eosinophils Absolute: 0.1 10*3/uL (ref 0.0–0.5)
Eosinophils Relative: 2 %
HCT: 35.1 % — ABNORMAL LOW (ref 36.0–46.0)
Hemoglobin: 10.8 g/dL — ABNORMAL LOW (ref 12.0–15.0)
Immature Granulocytes: 0 %
Lymphocytes Relative: 28 %
Lymphs Abs: 2.1 10*3/uL (ref 0.7–4.0)
MCH: 25.8 pg — ABNORMAL LOW (ref 26.0–34.0)
MCHC: 30.8 g/dL (ref 30.0–36.0)
MCV: 84 fL (ref 80.0–100.0)
Monocytes Absolute: 0.6 10*3/uL (ref 0.1–1.0)
Monocytes Relative: 8 %
Neutro Abs: 4.7 10*3/uL (ref 1.7–7.7)
Neutrophils Relative %: 62 %
Platelets: 256 10*3/uL (ref 150–400)
RBC: 4.18 MIL/uL (ref 3.87–5.11)
RDW: 14.9 % (ref 11.5–15.5)
WBC: 7.5 10*3/uL (ref 4.0–10.5)
nRBC: 0 % (ref 0.0–0.2)

## 2020-02-02 LAB — URINALYSIS, ROUTINE W REFLEX MICROSCOPIC
Bilirubin Urine: NEGATIVE
Glucose, UA: NEGATIVE mg/dL
Ketones, ur: NEGATIVE mg/dL
Leukocytes,Ua: NEGATIVE
Nitrite: NEGATIVE
Protein, ur: NEGATIVE mg/dL
Specific Gravity, Urine: <1.005 — ABNORMAL LOW (ref 1.005–1.030)
pH: 6 (ref 5.0–8.0)

## 2020-02-02 LAB — URINALYSIS, MICROSCOPIC (REFLEX)
Bacteria, UA: NONE SEEN
Squamous Epithelial / HPF: NONE SEEN (ref 0–5)

## 2020-02-02 LAB — I-STAT BETA HCG BLOOD, ED (MC, WL, AP ONLY): I-stat hCG, quantitative: <5 m[IU]/mL (ref <5)

## 2020-02-02 LAB — LIPASE, BLOOD: Lipase: 27 U/L (ref 11–51)

## 2020-02-02 MED ORDER — IOHEXOL 300 MG/ML  SOLN
100.0000 mL | Freq: Once | INTRAMUSCULAR | Status: AC | PRN
  Administered 2020-02-02: 100 mL via INTRAVENOUS

## 2020-02-02 MED ORDER — SODIUM CHLORIDE (PF) 0.9 % IJ SOLN
INTRAMUSCULAR | Status: AC
  Filled 2020-02-02: qty 50

## 2020-02-02 MED ORDER — ONDANSETRON 4 MG PO TBDP
4.0000 mg | ORAL_TABLET | Freq: Three times a day (TID) | ORAL | 0 refills | Status: DC | PRN
Start: 2020-02-02 — End: 2020-10-07

## 2020-02-02 MED ORDER — ONDANSETRON HCL 4 MG/2ML IJ SOLN
4.0000 mg | Freq: Once | INTRAMUSCULAR | Status: DC
  Filled 2020-02-02: qty 2

## 2020-02-02 MED ORDER — MORPHINE SULFATE (PF) 4 MG/ML IV SOLN
4.0000 mg | Freq: Once | INTRAVENOUS | Status: AC
  Administered 2020-02-02: 4 mg via INTRAVENOUS
  Filled 2020-02-02: qty 1

## 2020-02-02 MED ORDER — ACETAMINOPHEN ER 650 MG PO TBCR
650.0000 mg | EXTENDED_RELEASE_TABLET | Freq: Three times a day (TID) | ORAL | 0 refills | Status: DC | PRN
Start: 2020-02-02 — End: 2020-06-27

## 2020-02-02 MED ORDER — HYDROMORPHONE HCL 1 MG/ML IJ SOLN
1.0000 mg | Freq: Once | INTRAMUSCULAR | Status: AC
  Administered 2020-02-02: 1 mg via INTRAVENOUS
  Filled 2020-02-02: qty 1

## 2020-02-02 NOTE — ED Triage Notes (Signed)
Patient BIBA from dr office complaining of RLQ abdominal pain, n/v and headache that started approx 2 weeks ago. Patient reports pain radiates to back.   4 mg zofran 200 cc NS   BP 114/60 P 63  100% RA CBG 119

## 2020-02-02 NOTE — ED Notes (Signed)
Labeled urine specimen and culture sent to lab. ENMiles 

## 2020-02-02 NOTE — ED Provider Notes (Signed)
Care assumed from [facility] [place] at shift change pending UA. See his note for full HPI.  In short, patient is a 54 year old female who presents to the ED via EMS due to right lower quadrant abdominal pain that has been ongoing for the past 2 weeks. Patient denies vaginal and urinary symptoms. Abdominal pain associated with nausea and vomiting. Patient sent here from PCP to rule out appendicitis. Patient has not been sexually active in over 8 years and has no concern for STDs at this time.   Abdomen soft, nondistended, with tenderness to palpation in right lower quadrant and right flank region.  Negative CVA tenderness.  No overlying rash.  Upon arrival, patient is afebrile, not tachycardic or hypoxic. Patient in no acute distress and non-ill appearing.  Personally reviewed all labs from previous work-up.  Lipase normal at 27.  Doubt pancreatitis.  CBC reassuring with no leukocytosis.  CMP reassuring with normal renal function no major electrolyte abnormalities.  Alk phos has improved from 2 months ago. Patient has no RUQ abdominal pain.   IMPRESSION:  Midline lower abdominal wall ventral hernia containing nonobstructed  loop of small bowel.    Normal appendix.   Given patient is not having midline lower abdominal wall where the location of the hernia, no concern for incarceration or strangulation. Tenderness is more focused in RLQ/flank region. Discussed case with Dr. Zenia Resides who agrees with assessment and plan.   UA significant for hematuria, but negative for signs of infection. Symptoms possible related to passed kidney stone given hematuria and location of pain. No concern for pyelonephritis.   7:54 PM reassessed patient at bedside who notes mild improvement in symptoms. Will discharge patient with general surgery referral for further evaluation of hernia. No concern for incarceration or strangulation at this time. Will discharge patient with Tylenol and zofran. Strict ED precautions  discussed with patient. Patient states understanding and agrees to plan. Patient discharged home in no acute distress and stable vitals.      Karie Kirks 02/02/20 2004    Lacretia Leigh, MD 02/02/20 [phone]

## 2020-02-02 NOTE — ED Provider Notes (Signed)
[facility] DEPT Provider Note   CSN: [id number] Arrival date & time: 02/02/20  1148     History No chief complaint on file.   Meghan [name] is a 54 y.o. female history of A. fib on Xarelto, PE, CHF, obesity, sleep apnea, CVA, asthma, hysterectomy, cholecystectomy.  Patient presents today via EMS for right lower quadrant abdominal pain ongoing for 2 weeks describes a constant sharp pain radiating towards her right lower back worsened with palpation no alleviating factors gradually worsening over the past 2 weeks.  Associated with intermittent nausea and nonbloody/nonbilious emesis.  She was sent in today by her primary care doctor for concern of appendicitis.  She denies fever/chills, cough/hemoptysis, chest pain/shortness of breath, dysuria/hematuria, vaginal bleeding/discharge or concern for STI, fall/injury or any additional concerns.  HPI     Past Medical History:  Diagnosis Date  . Arthritis    left knee  . Asthma   . Atrial fibrillation ([place])   . CHF (congestive heart failure) ([place]) 01/2019   Physician from [place] informed her she had this   . Dysrhythmia    Atrial fibrillation  . History of pulmonary embolus ([place])   . Macular degeneration    of left eye  . Pneumonia    in the past  . Seizure disorder ([place]) 2017  . Sleep apnea   . Stroke [facility])    1989 and 1995 (left sided weakness)    There are no problems to display for this patient.   Past Surgical History:  Procedure Laterality Date  . ABDOMINAL HYSTERECTOMY  2003   complete  . CARDIAC CATHETERIZATION  2017   in [facility] in [place]  . CESAREAN SECTION  [id number]   x 2  . CESAREAN SECTION     x 2  . CHOLECYSTECTOMY  2003  . COLONOSCOPY WITH PROPOFOL N/A 06/05/2019   Procedure: COLONOSCOPY WITH PROPOFOL;  Surgeon: Carol Ada, MD;  Location: [facility] ENDOSCOPY;  Service: Endoscopy;  Laterality: N/A;  . HERNIA REPAIR  [id number]   umbilical hernia repair  . IVC FILTER  INSERTION  2003   Hx pulmonary embolus  . POLYPECTOMY  06/05/2019   Procedure: POLYPECTOMY;  Surgeon: Carol Ada, MD;  Location: [facility] ENDOSCOPY;  Service: Endoscopy;;  . SHOULDER ARTHROSCOPY W/ ROTATOR CUFF REPAIR  08/28/2017   right shoulder     OB History   No obstetric history on file.     No family history on file.  Social History   Tobacco Use  . Smoking status: Never Smoker  . Smokeless tobacco: Never Used  Substance Use Topics  . Alcohol use: Never  . Drug use: Never    Home Medications Prior to Admission medications   Medication Sig Start Date End Date Taking? Authorizing Provider  acetaminophen (TYLENOL) 650 MG CR tablet Take 1,300 mg by mouth at bedtime as needed for pain.    [provider]  albuterol (VENTOLIN HFA) 108 (90 Base) MCG/ACT inhaler Inhale 2 puffs into the lungs every 6 (six) hours as needed for wheezing or shortness of breath.    [provider]  budesonide-formoterol (SYMBICORT) 80-4.5 MCG/ACT inhaler Inhale 2 puffs into the lungs 2 (two) times daily.    [provider]  cycloSPORINE (RESTASIS) 0.05 % ophthalmic emulsion Place 2 drops into both eyes 2 (two) times daily.    [provider]  diclofenac Sodium (VOLTAREN) 1 % GEL Apply 2 g topically 4 (four) times daily as needed (pain). 11/27/19   Long,  Wonda Olds, MD  diltiazem (DILT-XR) 120 MG 24 hr capsule Take 120 mg by mouth daily with breakfast.     [provider]  flecainide (TAMBOCOR) 50 MG tablet Take 100 mg by mouth daily with breakfast.  09/04/19   [provider]  furosemide (LASIX) 40 MG tablet Take 40 mg by mouth daily with breakfast.     [provider]  metFORMIN (GLUCOPHAGE) 500 MG tablet Take 250 mg by mouth daily with breakfast.     [provider]  Multiple Vitamins-Minerals (PRESERVISION AREDS 2 PO) Take 2 capsules by mouth daily with breakfast.     [provider]  Omega-3 Fatty Acids (FISH OIL) 1000 MG  CAPS Take 1,000 mg by mouth daily with breakfast.     [provider]  rivaroxaban (XARELTO) 20 MG TABS tablet Take 20 mg by mouth daily with supper.     [provider]  tiZANidine (ZANAFLEX) 4 MG tablet Take 4 mg by mouth at bedtime as needed for muscle spasms.     [provider]  vitamin B-12 (CYANOCOBALAMIN) 1000 MCG tablet Take 1,000 mcg by mouth daily with breakfast.     [provider]    Allergies    Other, Acetaminophen-codeine, Coconut oil, and Tomato  Review of Systems   Review of Systems Ten systems are reviewed and are negative for acute change except as noted in the HPI  Physical Exam Updated Vital Signs There were no vitals taken for this visit.  Physical Exam Constitutional:      General: She is not in acute distress.    Appearance: Normal appearance. She is well-developed. She is obese. She is not ill-appearing or diaphoretic.  HENT:     Head: Normocephalic and atraumatic.     Right Ear: External ear normal.     Left Ear: External ear normal.     Nose: Nose normal.  Eyes:     General: Vision grossly intact. Gaze aligned appropriately.     Pupils: Pupils are equal, round, and reactive to light.  Neck:     Trachea: Trachea and phonation normal. No tracheal deviation.  Pulmonary:     Effort: Pulmonary effort is normal. No respiratory distress.  Abdominal:     General: There is no distension.     Palpations: Abdomen is soft.     Tenderness: There is abdominal tenderness in the right upper quadrant and right lower quadrant. There is guarding. There is no rebound.  Musculoskeletal:        General: Normal range of motion.     Cervical back: Normal range of motion.  Skin:    General: Skin is warm and dry.  Neurological:     Mental Status: She is alert.     GCS: GCS eye subscore is 4. GCS verbal subscore is 5. GCS motor subscore is 6.     Comments: Speech is clear and goal oriented, follows commands Major Cranial nerves without  deficit, no facial droop Moves extremities without ataxia, coordination intact  Psychiatric:        Behavior: Behavior normal.     ED Results / Procedures / Treatments   Labs (all labs ordered are listed, but only abnormal results are displayed) Labs Reviewed - No data to display  EKG None  Radiology No results found.  Procedures Procedures (including critical care time)  Medications Ordered in ED Medications - No data to display  ED Course  I have reviewed the triage vital signs and the  nursing notes.  Pertinent labs & imaging results that were available during my care of the patient were reviewed by me and considered in my medical decision making (see chart for details).    MDM Rules/Calculators/A&P                     54 year old female with history as detailed above presents today for right lower quadrant pain x2 weeks with nausea and vomiting.  She was sent in today by PCP for concern of appendicitis.  On exam she is stable appearing with unremarkable vital signs.  She has moderate right-sided abdominal tenderness without rebound.  Will obtain lab work including CBC, CMP, lipase, urinalysis in addition to CT abdomen pelvis for evaluation of patient's pain.  Patient reports she received Zofran prior to arrival which has helped with her nausea.  Will give 4 mg morphine for pain. - CBC shows no leukocytosis to suggest infection, hemoglobin of 10.8 baseline.  CMP shows no emergent electrolyte derangement, acute kidney injury, minimal elevation of alk phos at 174.  Lipase within normal limits no evidence of pancreatitis.  Present test negative.  CT AP:  IMPRESSION:  Midline lower abdominal wall ventral hernia containing nonobstructed  loop of small bowel.    Normal appendix.  = Patient reassessed reports continuation of pain, Dilaudid was ordered.  Will need urinalysis for evaluation of UTI and to see if this is possibly Pyelo?.  Care handoff given to Charmaine Downs PA-C  at shift change.  Plan of care is to follow-up on urinalysis, reassess patient if pain improved and patient tolerating p.o. anticipate discharge with outpatient follow-up.   Note: Portions of this report may have been transcribed using voice recognition software. Every effort was made to ensure accuracy; however, inadvertent computerized transcription errors may still be present. Final Clinical Impression(s) / ED Diagnoses Final diagnoses:  None    Rx / DC Orders ED Discharge Orders    None       Gari Crown 02/02/20 1516    Charlesetta Shanks, MD 02/24/20 1446

## 2020-02-02 NOTE — Discharge Instructions (Addendum)
As discussed, your CT scan was negative for appendicitis.  It did show a hernia.  Please follow-up with PCP for further evaluation.  I am sending home with Tylenol as needed for pain.  Also sending home with nausea medication.  Take as prescribed.  If symptoms do not improve within the next week, follow-up with PCP for further evaluation.  Return to the ER for new or worsening symptoms.  I have included the number for the general surgeon. Please call tomorrow to schedule an appointment for further evaluation.

## 2020-02-08 ENCOUNTER — Other Ambulatory Visit: Payer: Self-pay

## 2020-02-08 ENCOUNTER — Ambulatory Visit (INDEPENDENT_AMBULATORY_CARE_PROVIDER_SITE_OTHER): Payer: Medicare Other | Admitting: Licensed Clinical Social Worker

## 2020-02-08 DIAGNOSIS — F4310 Post-traumatic stress disorder, unspecified: Secondary | ICD-10-CM

## 2020-02-08 DIAGNOSIS — F331 Major depressive disorder, recurrent, moderate: Secondary | ICD-10-CM | POA: Diagnosis not present

## 2020-02-08 NOTE — Progress Notes (Signed)
Virtual Visit via Video Note  I connected withRenee Woodon5/24/21at2:00pmbyvideo enabled telephonic applicationand verified that I am speaking with the correct person using two identifiers.  I discussed the limitations, risks, security and privacy concerns of performing an evaluation and management service by telephone and the availability of in person appointments. I also discussed with the patient that there may be a patient responsible charge related to this service. The patient expressed understanding and agreed to proceed.  I discussed the assessment and treatment plan with the patient. The patient was provided an opportunity to ask questions and all were answered. The patient agreed with the plan and demonstrated an understanding of the instructions.  The patient was advised to call back or seek an in-person evaluation if the symptoms worsen or if the condition fails to improve as anticipated.  I provided45 minutes of non-face-to-face time during this encounter.   Meghan Flood, LCSW, LCAS _______________________ THERAPIST PROGRESS NOTE  Session Time:2:00pm - 2:45pm  Location: Patient: Patient Home Provider: Clinical Home Office   Participation Level:Active  Behavioral Response:Alert, casually dressed,anxiousmood/affect  Type of Therapy: Individual Therapy  Treatment Goals addressed:Anxietymanagement;Socialization with family; Healthyboundaries and communication skills; Exercise; Writing progress  Interventions:CBT, worry exploration  Summary:[name] is a 54 year old African American female thatpresented for virtual therapy appointment today with Major Depressive Disorder, recurrent, moderate, and PTSD.  Suicidal/Homicidal:None;without plan or intent  Therapist Response:Clinician met with Meghan [name] for virtual appointment via video enabled telephonic application.  Clinician assessed for safety and medication compliance.  Meghan [name]  presented for appointment on time and was alert, oriented x5, with no evidence of SI/HI or A/V H.  Meghan [name] reported that she continues taking medicine as prescribed.  Clinician inquired about Meghan [name]'s emotional ratings today, as well as any significant changes in thoughts, feelings, or behavior since last appointment. Meghan [name] reported scores of 0/10 for depression, and 20/10 for both anxiety and fear today, stating "I got a call from the doctor the other day and they said there is an area in my leg that doesn't look so good".  Clinician inquired about how long this issue has presented for, and whether her doctor has determined a course of treatment yet.  Meghan [name] reported that she has had varicose veins for over a decade, but one of her legs has begun to bother her more recently, and when she went in for a checkup, the doctor got a supervisor involved for a consult, and they ended up taking a sample from her leg, and may also have a biopsy done.  Meghan [name] reported that she now has to wait until the doctor completes the test, so she has been caught up in the 'what ifs', ruminating on worst case scenarios.  Clinician used worry exploration exercise with Meghan [name] to approach this issue, inquiring about whether there are any clues that suggest these worst case scenarios won't come true, how she will handle things if the worst case scenario should occur, and how she feels as a result of challenging cognitive distortions rather than allowing them to cycle. Meghan [name] acknowledged that she has had a number of medical issues crop up in the past which terrified her, but out of roughly 62 of these, only 1 case ended up resembling the worst case scenario she imagined.  Meghan [name] also acknowledged that she is resilient, and was able to overcome any challenges she has faced in life, so she ended up feeling more confident and calm after discussing this.  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  Meghan [name] stated "I know I'm overthinking it and I should know better at this point".  Meghan [name] reported that she would pray on it, and try to be patient until her doctor outreaches her again for result.  Clinician inquired about Meghan [name]'s plan for this upcoming week to continue making progress towards goals.  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  Interventions were effective, as evidenced by Meghan [name] reporting that her anxiety dropped to a 3-4/10 in severity due to session, and stating "I'm not as anxious or afraid now that I've talked about it, and I can focus on other things".  Clinician will continue to monitor.     Plan:Follow up again in 1 week virtually.   Diagnosis: Major depressive disorder, recurrent, moderate; PTSD  Meghan [name], LCAS 02/08/20

## 2020-02-16 ENCOUNTER — Ambulatory Visit (INDEPENDENT_AMBULATORY_CARE_PROVIDER_SITE_OTHER): Payer: Medicare Other | Admitting: Licensed Clinical Social Worker

## 2020-02-16 ENCOUNTER — Other Ambulatory Visit: Payer: Self-pay

## 2020-02-16 DIAGNOSIS — F331 Major depressive disorder, recurrent, moderate: Secondary | ICD-10-CM | POA: Diagnosis not present

## 2020-02-16 DIAGNOSIS — F4310 Post-traumatic stress disorder, unspecified: Secondary | ICD-10-CM

## 2020-02-16 NOTE — Progress Notes (Signed)
Virtual Visit via Video Note   I connected with Meghan [name] on 02/16/20 at 2:00pm by video enabled telephonic application and verified that I am speaking with the correct person using two identifiers.   I discussed the limitations, risks, security and privacy concerns of performing an evaluation and management service by telephone and the availability of in person appointments. I also discussed with the patient that there may be a patient responsible charge related to this service. The patient expressed understanding and agreed to proceed.   I discussed the assessment and treatment plan with the patient. The patient was provided an opportunity to ask questions and all were answered. The patient agreed with the plan and demonstrated an understanding of the instructions.   The patient was advised to call back or seek an in-person evaluation if the symptoms worsen or if the condition fails to improve as anticipated.   I provided 1 hour of non-face-to-face time during this encounter.     [name], LCSW, LCAS _______________________ THERAPIST PROGRESS NOTE   Session Time: 2:00pm - 3:00pm  Location: Patient: Patient Home Provider: [place] Office    Participation Level: Active    Behavioral Response: Alert, casually dressed, anxious and depressed mood/affect   Type of Therapy:  Individual Therapy   Treatment Goals addressed: Anxiety management; Socialization with family; Healthy boundaries and communication skills; Exercise; Writing progress   Interventions: CBT, Gestalt therapy, assertive communication and boundaries   Summary: Meghan [name] is a 54 year old African American female that presented for virtual therapy appointment today with Major Depressive Disorder, recurrent, moderate, and PTSD.      Suicidal/Homicidal: None; without plan or intent    Therapist Response:  Clinician met with Meghan [name] for virtual therapy session today.  Clinician assessed for safety and medication compliance.   Meghan [name] presented for session on time and was alert, oriented x5, with no evidence of SI/HI or A/V H.  Meghan [name] reported ongoing compliance with medication.  Clinician inquired about Meghan [name]'s current emotional ratings, as well as any significant changes in thoughts, feelings, or behavior since previous session. Meghan [name] reported scores of 10/10 for both depression, and anxiety today. Meghan [name] reported that her mood has been significantly impacted since visiting family in [place] in past week, stating "It all put me in a terrible mood, and I've put my phone on do not disturb today because I haven't wanted to talk to anyone".  Clinician inquired about significant events which transpired in days leading up to this trip, the trip itself, as well as what happened afterward.  Meghan [name] reported that she has been in pain due to her leg, and when she was on the trip the family member that she was visiting did not acknowledge her or validate her thoughtful gift or effort, so this made her 'shut down'.  Meghan [name] also reported that on the day she was returning from the trip, she learned of a rumor circulating about her at church, and stated "All of this was just too much and made me want to be alone".  Clinician facilitated discussion on each recent individual event with Meghan [name] to identify realistic solutions that could be explored to resolve mood, reinforce personal strengths and supports.  Meghan [name] acknowledged that there isn't much she can do about her leg pain besides take OTC pain medicine and stay connected with doctor, who she visited on Friday for an ultrasound.  She reported that she does not intend to speak with her cousin anymore due to realizing that the relationship  is imbalanced, and seeing that this person has been taking advantage of her kindness.  Clinician utilized 'empty chair' technique with Meghan [name] to increase insight into the interpersonal conflict with the cousin, having her sit across from an empty chair, express her thoughts and  feelings on what transpired, and then imagine this person's response to gain greater perspective and practice appropriate assertive responses to improve boundaries between them.  Meghan [name] participated in this activity successfully and reported that she did feel better, but is ambivalent about expressing herself to this cousin yet, although she acknowledged that she would not overextend herself again by putting their needs before her own.  Meghan [name] reported that her pastor outreached her about the other issue at church with her congregation, and she will be speaking with him later today to determine an appropriate course of action to resolve the rumor, stating "I'll hear him out, but I'm not trying to deal with anymore drama".  Clinician encouraged Meghan [name] to avoid isolation following recent mood shift, stay connected with positive supports that have her best interests in mind, and focus on goals this week to stay busy and improve mood.  Meghan [name] acknowledged that she hasn't written in a few days and this would be a good distraction, in addition to taking a walk this afternoon once it cools down to clear her head and get some fresh air.  Clinician will continue to monitor.                              Plan: Follow up again in 1 week virtually.    Diagnosis: Major depressive disorder, recurrent, moderate; PTSD    [name], LCSW, LCAS 02/16/20

## 2020-02-17 ENCOUNTER — Ambulatory Visit (HOSPITAL_COMMUNITY): Payer: Medicare Other | Admitting: Licensed Clinical Social Worker

## 2020-02-18 ENCOUNTER — Emergency Department (HOSPITAL_COMMUNITY): Payer: Medicare Other

## 2020-02-18 ENCOUNTER — Other Ambulatory Visit: Payer: Self-pay

## 2020-02-18 ENCOUNTER — Emergency Department (HOSPITAL_COMMUNITY)
Admission: EM | Admit: 2020-02-18 | Discharge: 2020-02-18 | Disposition: A | Discharge status: Home / Self Care | Payer: Self-pay

## 2020-02-18 ENCOUNTER — Encounter (HOSPITAL_COMMUNITY): Payer: Self-pay | Admitting: *Deleted

## 2020-02-18 ENCOUNTER — Emergency Department (HOSPITAL_COMMUNITY)
Admission: EM | Admit: 2020-02-18 | Discharge: 2020-02-18 | Disposition: A | Discharge status: Home / Self Care | Payer: Medicare Other | Attending: Emergency Medicine | Admitting: Emergency Medicine

## 2020-02-18 DIAGNOSIS — M79605 Pain in left leg: Secondary | ICD-10-CM | POA: Diagnosis not present

## 2020-02-18 DIAGNOSIS — W19XXXA Unspecified fall, initial encounter: Secondary | ICD-10-CM | POA: Diagnosis not present

## 2020-02-18 DIAGNOSIS — R55 Syncope and collapse: Secondary | ICD-10-CM | POA: Diagnosis present

## 2020-02-18 DIAGNOSIS — Y9301 Activity, walking, marching and hiking: Secondary | ICD-10-CM | POA: Diagnosis not present

## 2020-02-18 DIAGNOSIS — M542 Cervicalgia: Secondary | ICD-10-CM | POA: Diagnosis not present

## 2020-02-18 DIAGNOSIS — R519 Headache, unspecified: Secondary | ICD-10-CM | POA: Diagnosis not present

## 2020-02-18 DIAGNOSIS — R42 Dizziness and giddiness: Secondary | ICD-10-CM | POA: Insufficient documentation

## 2020-02-18 DIAGNOSIS — Y9289 Other specified places as the place of occurrence of the external cause: Secondary | ICD-10-CM | POA: Diagnosis not present

## 2020-02-18 DIAGNOSIS — Y998 Other external cause status: Secondary | ICD-10-CM | POA: Insufficient documentation

## 2020-02-18 DIAGNOSIS — R61 Generalized hyperhidrosis: Secondary | ICD-10-CM | POA: Diagnosis not present

## 2020-02-18 LAB — COMPREHENSIVE METABOLIC PANEL
ALT: 12 U/L (ref 0–44)
AST: 14 U/L — ABNORMAL LOW (ref 15–41)
Albumin: 3.5 g/dL (ref 3.5–5.0)
Alkaline Phosphatase: 176 U/L — ABNORMAL HIGH (ref 38–126)
Anion gap: 11 (ref 5–15)
BUN: 18 mg/dL (ref 6–20)
CO2: 26 mmol/L (ref 22–32)
Calcium: 10.1 mg/dL (ref 8.9–10.3)
Chloride: 103 mmol/L (ref 98–111)
Creatinine, Ser: 1.05 mg/dL — ABNORMAL HIGH (ref 0.44–1.00)
GFR calc Af Amer: >60 mL/min (ref >60)
GFR calc non Af Amer: 60 mL/min — ABNORMAL LOW (ref >60)
Glucose, Bld: 100 mg/dL — ABNORMAL HIGH (ref 70–99)
Potassium: 4.2 mmol/L (ref 3.5–5.1)
Sodium: 140 mmol/L (ref 135–145)
Total Bilirubin: 0.5 mg/dL (ref 0.3–1.2)
Total Protein: 7.6 g/dL (ref 6.5–8.1)

## 2020-02-18 LAB — CBC WITH DIFFERENTIAL/PLATELET
Abs Immature Granulocytes: 0.03 10*3/uL (ref 0.00–0.07)
Basophils Absolute: 0 10*3/uL (ref 0.0–0.1)
Basophils Relative: 0 %
Eosinophils Absolute: 0.1 10*3/uL (ref 0.0–0.5)
Eosinophils Relative: 1 %
HCT: 36.9 % (ref 36.0–46.0)
Hemoglobin: 11.6 g/dL — ABNORMAL LOW (ref 12.0–15.0)
Immature Granulocytes: 0 %
Lymphocytes Relative: 20 %
Lymphs Abs: 1.6 10*3/uL (ref 0.7–4.0)
MCH: 25.9 pg — ABNORMAL LOW (ref 26.0–34.0)
MCHC: 31.4 g/dL (ref 30.0–36.0)
MCV: 82.4 fL (ref 80.0–100.0)
Monocytes Absolute: 0.7 10*3/uL (ref 0.1–1.0)
Monocytes Relative: 9 %
Neutro Abs: 5.4 10*3/uL (ref 1.7–7.7)
Neutrophils Relative %: 70 %
Platelets: 280 10*3/uL (ref 150–400)
RBC: 4.48 MIL/uL (ref 3.87–5.11)
RDW: 14.8 % (ref 11.5–15.5)
WBC: 7.7 10*3/uL (ref 4.0–10.5)
nRBC: 0 % (ref 0.0–0.2)

## 2020-02-18 LAB — PROTIME-INR
INR: 1 (ref 0.8–1.2)
Prothrombin Time: 13.1 seconds (ref 11.4–15.2)

## 2020-02-18 MED ORDER — NAPROXEN 500 MG PO TABS
500.0000 mg | ORAL_TABLET | Freq: Two times a day (BID) | ORAL | 0 refills | Status: DC
Start: 2020-02-18 — End: 2020-02-18

## 2020-02-18 MED ORDER — SODIUM CHLORIDE 0.9 % IV BOLUS (SEPSIS)
500.0000 mL | Freq: Once | INTRAVENOUS | Status: AC
  Administered 2020-02-18: 500 mL via INTRAVENOUS

## 2020-02-18 MED ORDER — MORPHINE SULFATE (PF) 4 MG/ML IV SOLN
4.0000 mg | Freq: Once | INTRAVENOUS | Status: AC
  Administered 2020-02-18: 4 mg via INTRAVENOUS
  Filled 2020-02-18: qty 1

## 2020-02-18 MED ORDER — CYCLOBENZAPRINE HCL 10 MG PO TABS: 10.0000 mg | ORAL_TABLET | Freq: Two times a day (BID) | ORAL | 0 refills | Status: AC | PRN

## 2020-02-18 MED ORDER — CYCLOBENZAPRINE HCL 10 MG PO TABS
10.0000 mg | ORAL_TABLET | Freq: Two times a day (BID) | ORAL | 0 refills | Status: DC | PRN
Start: 2020-02-18 — End: 2020-02-18

## 2020-02-18 MED ORDER — NAPROXEN 500 MG PO TABS
500.0000 mg | ORAL_TABLET | Freq: Two times a day (BID) | ORAL | 0 refills | Status: DC
Start: 2020-02-18 — End: 2020-06-27

## 2020-02-18 MED ORDER — HYDROMORPHONE HCL 1 MG/ML IJ SOLN
1.0000 mg | Freq: Once | INTRAMUSCULAR | Status: AC
  Administered 2020-02-18: 1 mg via INTRAVENOUS
  Filled 2020-02-18: qty 1

## 2020-02-18 NOTE — ED Notes (Signed)
Orthostatic bp's  117/63 (78) lying  122/79 (91) sitting 138/73 (89) standing

## 2020-02-18 NOTE — ED Provider Notes (Signed)
[facility] EMERGENCY DEPARTMENT Provider Note   CSN: [id number] Arrival date & time: 02/18/20  1244     History Chief Complaint  Patient presents with   Lytle Michaels    Meghan [name] is a 54 y.o. female.  Patient is a 54 year old female with past medical history of atrial fibrillation on Xarelto presenting to the emergency department for syncope.  Patient reports that she remembers she was walking outside on the sidewalk to go and pay her rent and then remembers being woken up by bystanders.  According to EMS, bystanders report that the patient passed out and had some shaking for about 30 seconds.  There is no urinary incontinence, no tongue biting, no apparent postictal state. She does note that earlier in the day she had felt sweaty and lightheaded and tired so she went home and it subsided. Patient reports that currently she feels fine other than having some pain in her head, neck and left leg. She does report she has a hx of seizures in the past b ut was weaned off of her keppra by her neurologist due to not having seizures. She reports she usually has an aura with her seizures but she did not this time.  She denies any chest pain, shortness of breath.  She took her Xarelto last night but has not yet taken it today.        Past Medical History:  Diagnosis Date   Atrial fibrillation ([place])     There are no problems to display for this patient.   History reviewed. No pertinent surgical history.   OB History   No obstetric history on file.     No family history on file.  Social History   Tobacco Use   Smoking status: Never Smoker   Smokeless tobacco: Never Used  Substance Use Topics   Alcohol use: Never   Drug use: Never    Home Medications Prior to Admission medications   Medication Sig Start Date End Date Taking? Authorizing Provider  cyclobenzaprine (FLEXERIL) 10 MG tablet Take 1 tablet (10 mg total) by mouth 2 (two) times daily as needed for up to  7 days for muscle spasms. 02/18/20 02/25/20  Madilyn Hook A, PA-C  naproxen (NAPROSYN) 500 MG tablet Take 1 tablet (500 mg total) by mouth 2 (two) times daily. 02/18/20   Alveria Apley, PA-C    Allergies    Patient has no allergy information on record.  Review of Systems   Review of Systems  Constitutional: Negative for chills and fever.  HENT: Negative for nosebleeds.   Eyes: Negative for visual disturbance.  Respiratory: Negative for cough and shortness of breath.   Gastrointestinal: Negative for abdominal pain, diarrhea, nausea and vomiting.  Musculoskeletal: Positive for arthralgias, back pain and neck pain.  Skin: Positive for wound. Negative for rash.  Neurological: Positive for syncope and headaches. Negative for dizziness and light-headedness.  Hematological: Bruises/bleeds easily.  All other systems reviewed and are negative.   Physical Exam Updated Vital Signs BP 122/80    Pulse 69    Temp 98.2 F (36.8 C) (Oral)    Resp (!) 27    Ht 5\' 5"  (1.651 m)    Wt (!) 139.7 kg    SpO2 100%    BMI 51.25 kg/m   Physical Exam Vitals and nursing note reviewed.  Constitutional:      General: She is not in acute distress.    Appearance: Normal appearance. She is obese. She is not  ill-appearing, toxic-appearing or diaphoretic.  HENT:     Head: Normocephalic and atraumatic.     Comments: Tenderness to palpation of the posterior scalp    Nose: Nose normal.     Mouth/Throat:     Mouth: Mucous membranes are moist.  Eyes:     Extraocular Movements: Extraocular movements intact.     Conjunctiva/sclera: Conjunctivae normal.     Pupils: Pupils are equal, round, and reactive to light.  Neck:     Comments: In ccollar, complains of neck pain at rest Cardiovascular:     Rate and Rhythm: Normal rate and regular rhythm.  Pulmonary:     Effort: Pulmonary effort is normal.     Breath sounds: Normal breath sounds. No wheezing.  Chest:     Comments: Diffuse tenderness to palpation to the  anterior chest without visible signs of injury or trauma Abdominal:     General: Abdomen is flat.     Palpations: Abdomen is soft.     Tenderness: There is no abdominal tenderness.  Musculoskeletal:     Comments: She is moving all 4 extremities but reports that she has pain in the inside of her left thigh and knee.  No obvious signs of trauma to the lower extremities.  She just has a superficial abrasion to the left humerus  Skin:    General: Skin is dry.  Neurological:     General: No focal deficit present.     Mental Status: She is alert and oriented to person, place, and time.  Psychiatric:        Mood and Affect: Mood normal.     ED Results / Procedures / Treatments   Labs (all labs ordered are listed, but only abnormal results are displayed) Labs Reviewed  COMPREHENSIVE METABOLIC PANEL - Abnormal; Notable for the following components:      Result Value   Glucose, Bld 100 (*)    Creatinine, Ser 1.05 (*)    AST 14 (*)    Alkaline Phosphatase 176 (*)    GFR calc non Af Amer 60 (*)    All other components within normal limits  CBC WITH DIFFERENTIAL/PLATELET - Abnormal; Notable for the following components:   Hemoglobin 11.6 (*)    MCH 25.9 (*)    All other components within normal limits  PROTIME-INR    EKG EKG Interpretation  Date/Time:  Thursday February 18 2020 12:54:25 EDT Ventricular Rate:  71 PR Interval:    QRS Duration: 82 QT Interval:  364 QTC Calculation: 396 R Axis:   75 Text Interpretation: Sinus rhythm Short PR interval Borderline T abnormalities, anterior leads Confirmed by Veryl Speak [phone]) on 02/18/2020 2:34:34 PM   Radiology DG Lumbar Spine Complete  Result Date: 02/18/2020 CLINICAL DATA:  Pain following fall EXAM: LUMBAR SPINE - COMPLETE 4+ VIEW COMPARISON:  None. FINDINGS: Frontal, lateral, spot lumbosacral lateral, and bilateral oblique views were obtained. There are 5 non-rib-bearing lumbar type vertebral bodies. There is no fracture. There is 4  mm of anterolisthesis of L4 on L5. No other spondylolisthesis evident. There is disc space narrowing at L4-5. There is slight disc space narrowing at L5-S1. Other disc spaces appear unremarkable. There is facet osteoarthritic change at L5-S1 bilaterally. There is a filter in the inferior vena cava. IMPRESSION: Disc space narrowing at L4-5 and L5-S1. Mild spondylolisthesis at L4-5 is felt to be due to underlying spondylosis. No fracture or spondylolisthesis. Inferior vena cava filter present. Electronically Signed   By: Lowella Grip III M.D.  On: 02/18/2020 14:17   CT Head Wo Contrast  Result Date: 02/18/2020 CLINICAL DATA:  Seizure and fall. EXAM: CT HEAD WITHOUT CONTRAST CT CERVICAL SPINE WITHOUT CONTRAST TECHNIQUE: Multidetector CT imaging of the head and cervical spine was performed following the standard protocol without intravenous contrast. Multiplanar CT image reconstructions of the cervical spine were also generated. COMPARISON:  None. FINDINGS: CT HEAD FINDINGS Brain: No evidence of acute infarction, hemorrhage, hydrocephalus, extra-axial collection or mass lesion/mass effect. Vascular: No hyperdense vessel or unexpected calcification. Skull: Normal. Negative for fracture or focal lesion. Sinuses/Orbits: No acute finding. Other: None. CT CERVICAL SPINE FINDINGS Alignment: Reversal of the normal cervical lordosis. No traumatic malalignment. Skull base and vertebrae: No acute fracture. No primary bone lesion or focal pathologic process. Soft tissues and spinal canal: No prevertebral fluid or swelling. No visible canal hematoma. Retropharyngeal course of the bilateral internal carotid arteries. Disc levels: Mild multilevel disc height loss and anterior endplate spurring throughout the cervical spine. Mild facet arthropathy at C6-C7 and C7-T1. Upper chest: Negative. Other: None. IMPRESSION: 1. No acute intracranial abnormality. 2. No acute cervical spine fracture or traumatic malalignment. 3. Mild  multilevel degenerative changes of the cervical spine. Electronically Signed   By: Titus Dubin M.D.   On: 02/18/2020 14:37   CT Cervical Spine Wo Contrast  Result Date: 02/18/2020 CLINICAL DATA:  Seizure and fall. EXAM: CT HEAD WITHOUT CONTRAST CT CERVICAL SPINE WITHOUT CONTRAST TECHNIQUE: Multidetector CT imaging of the head and cervical spine was performed following the standard protocol without intravenous contrast. Multiplanar CT image reconstructions of the cervical spine were also generated. COMPARISON:  None. FINDINGS: CT HEAD FINDINGS Brain: No evidence of acute infarction, hemorrhage, hydrocephalus, extra-axial collection or mass lesion/mass effect. Vascular: No hyperdense vessel or unexpected calcification. Skull: Normal. Negative for fracture or focal lesion. Sinuses/Orbits: No acute finding. Other: None. CT CERVICAL SPINE FINDINGS Alignment: Reversal of the normal cervical lordosis. No traumatic malalignment. Skull base and vertebrae: No acute fracture. No primary bone lesion or focal pathologic process. Soft tissues and spinal canal: No prevertebral fluid or swelling. No visible canal hematoma. Retropharyngeal course of the bilateral internal carotid arteries. Disc levels: Mild multilevel disc height loss and anterior endplate spurring throughout the cervical spine. Mild facet arthropathy at C6-C7 and C7-T1. Upper chest: Negative. Other: None. IMPRESSION: 1. No acute intracranial abnormality. 2. No acute cervical spine fracture or traumatic malalignment. 3. Mild multilevel degenerative changes of the cervical spine. Electronically Signed   By: Titus Dubin M.D.   On: 02/18/2020 14:37   DG Chest Portable 1 View  Result Date: 02/18/2020 CLINICAL DATA:  Pain following fall EXAM: PORTABLE CHEST 1 VIEW COMPARISON:  November 27, 2019 FINDINGS: Lungs are clear. There is cardiomegaly with pulmonary vascularity normal. No adenopathy. No pneumothorax. No bone lesions. IMPRESSION: Stable cardiac  enlargement.  Lungs clear. Electronically Signed   By: Lowella Grip III M.D.   On: 02/18/2020 13:20   DG Knee Complete 4 Views Left  Result Date: 02/18/2020 CLINICAL DATA:  Pain following fall EXAM: LEFT KNEE - COMPLETE 4+ VIEW COMPARISON:  None. FINDINGS: Frontal, lateral, and bilateral oblique views were obtained. There is no fracture or dislocation. No joint effusion. There is moderately severe narrowing of the patellofemoral joint. There is moderate narrowing medially. There is spurring medially and arising from the posterior patella. No erosive change. 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 No fracture, dislocation, or joint effusion. Electronically Signed   By:  Lowella Grip III M.D.   On: 02/18/2020 14:16   DG Hips Bilat W or Wo Pelvis 3-4 Views  Result Date: 02/18/2020 CLINICAL DATA:  Pain following fall EXAM: DG HIP (WITH OR WITHOUT PELVIS) 3-4V BILAT COMPARISON:  None. FINDINGS: Frontal pelvis as well as frontal and lateral views of each hip-total 5-views obtained. No fracture or dislocation. Joint spaces appear unremarkable. No erosive change. IMPRESSION: No fracture or dislocation.  No evident arthropathy. Electronically Signed   By: Lowella Grip III M.D.   On: 02/18/2020 14:15    Procedures Procedures (including critical care time)  Medications Ordered in ED Medications  morphine 4 MG/ML injection 4 mg (4 mg Intravenous Given 02/18/20 1256)  sodium chloride 0.9 % bolus 500 mL (500 mLs Intravenous New Bag/Given 02/18/20 1256)  HYDROmorphone (DILAUDID) injection 1 mg (1 mg Intravenous Given 02/18/20 1509)    ED Course  I have reviewed the triage vital signs and the nursing notes.  Pertinent labs & imaging results that were available during my care of the patient were reviewed by me and considered in my medical decision making (see chart for details).  Clinical Course as of Feb 18 1516    Thu Feb 18, 2020  1335 Patient with fall from standing position today. +LOC. On anticoagulation for afib. Stable and at baseline on arrival.    [KM]  1442 Overall imaging without acute findings. Patient remains stable but reports still having headache and pain. She does have a hx of seizures in the past but reports she saw neurology and workup revealed no seizures so she was taken off of keppra. She reports today did not feel like the seizures she has had in the past. I favor vasovagal syncope for the cause of this as patient had been hot and diaphoretic prior to episode and was not reported to be post ictal. Plan is to control patient's pain, ambulate and hydrate gently. She is agreeable with d/c once improved and f/u with neurology as a precaution.    [KM]    Clinical Course User Index [KM] Kristine [name]   MDM Rules/Calculators/A&P                      Final Clinical Impression(s) / ED Diagnoses Final diagnoses:  Fall, initial encounter    Rx / DC Orders ED Discharge Orders         Ordered    naproxen (NAPROSYN) 500 MG tablet  2 times daily     02/18/20 1514    cyclobenzaprine (FLEXERIL) 10 MG tablet  2 times daily PRN     02/18/20 1514           Kristine [name] 02/18/20 1517    Veryl Speak, MD 02/19/20 [phone]

## 2020-02-18 NOTE — Progress Notes (Signed)
Orthopedic Tech Progress Note Patient Details:  Meghan [name] [birth year] [id number]  Level 2 Trauma: Fall unresponsive  [name] 02/18/2020, 2:30 PM

## 2020-02-18 NOTE — Discharge Instructions (Addendum)
You were seen today for a fall.  Thankfully, your imaging studies showed no acute fracture or dislocation.  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  You will be sore for the next several days.  We are giving some medication for pain and inflammation.  Please ice the painful areas.  Make sure you follow-up with your primary care doctor.  Also, please follow-up with a neurologist given your recurrent headaches and history of seizures. Thank you for allowing me to care for you today. Please return to the emergency department if you have new or worsening symptoms. Take your medications as instructed.

## 2020-02-18 NOTE — ED Notes (Signed)
Patient given discharge instructions. Questions were answered. Patient verbalized understanding of discharge instructions and care at home.  

## 2020-02-18 NOTE — ED Triage Notes (Signed)
Patient presents to ed via [facility] states the last thing she remembers is that she was walking to the rental office to pay her rent and she passed out. Bystanders states she had seizure like activity with tremors lasting approx. 30 sec. Denies incont. Or oral trauma . C/o headache neck pain skin scrape to her left arm. C/o left knee pain and lower back pain. Currently alert oriented, c/o pain with moving left leg however she is able to moves all ext.

## 2020-02-19 ENCOUNTER — Encounter (HOSPITAL_COMMUNITY): Payer: Self-pay

## 2020-02-22 ENCOUNTER — Encounter: Payer: Self-pay | Admitting: Neurology

## 2020-02-24 ENCOUNTER — Ambulatory Visit (INDEPENDENT_AMBULATORY_CARE_PROVIDER_SITE_OTHER): Payer: Medicare Other | Admitting: Licensed Clinical Social Worker

## 2020-02-24 ENCOUNTER — Other Ambulatory Visit: Payer: Self-pay

## 2020-02-24 DIAGNOSIS — F4310 Post-traumatic stress disorder, unspecified: Secondary | ICD-10-CM | POA: Diagnosis not present

## 2020-02-24 DIAGNOSIS — F331 Major depressive disorder, recurrent, moderate: Secondary | ICD-10-CM

## 2020-02-24 NOTE — Progress Notes (Signed)
Virtual Visit via Video Note   I connected with Meghan [name] on 02/24/20 at 2:00pm by video enabled telemedicine application and verified that I am speaking with the correct person using two identifiers.   I discussed the limitations, risks, security and privacy concerns of performing an evaluation and management service by telephone and the availability of in person appointments. I also discussed with the patient that there may be a patient responsible charge related to this service. The patient expressed understanding and agreed to proceed.   I discussed the assessment and treatment plan with the patient. The patient was provided an opportunity to ask questions and all were answered. The patient agreed with the plan and demonstrated an understanding of the instructions.   The patient was advised to call back or seek an in-person evaluation if the symptoms worsen or if the condition fails to improve as anticipated.   I provided 45 minutes of non-face-to-face time during this encounter.     [name], LCSW, LCAS _______________________ THERAPIST PROGRESS NOTE   Session Time: 2:00pm - 2:45pm   Location: Patient: Patient Home Provider: [place] [facility]    Participation Level: Active    Behavioral Response: Alert, casually dressed, euthymic mood/affect   Type of Therapy:  Individual Therapy   Treatment Goals addressed: Anxiety management; Socialization with family; Healthy boundaries and communication skills; Exercise; Writing progress   Interventions: CBT, assertive communication and boundaries   Summary: Meghan [name] is a 54 year old African American female that presented for virtual therapy appointment today with Major Depressive Disorder, recurrent, moderate, and PTSD.      Suicidal/Homicidal: None; without plan or intent    Therapist Response:  Clinician met with Taliah for virtual therapy session.  Clinician assessed for safety and medication compliance.  Marthann presented for  appointment on time and was alert, oriented x5, with no evidence of SI/HI or A/V H.  Zeeva reported that she continues taking medication as prescribed.  Clinician inquired about [name]'s emotional ratings today, as well as any significant changes in thoughts, feelings, or behavior since last appointment. [name] reported scores of 0/10 for depression, and 5/10 for anxiety today. [name] reported that her mood has improved significantly since last meeting, but she still had some anxiety related to medical concerns, as she followed up with her doctor today about the pain in her leg, and learned that she only needs to have infection drawn out, but this did not completely comfort her.  [name] reported that her doctor also informed her that she has a hernia pressing on her liver which has left it inflamed, and although it is curable, she worries that she might develop cirrhosis and pass away like her mother did.  Clinician assisted [name] in utilizing CBT to highlight connection between her automatic negative thoughts regarding medical issues leading to increase in feelings of anxiety, hopelessness, and powerlessness.  Clinician also noted similar events throughout [name]'s treatment where she has fallen into similar mindset when unexpected medical challenges arose, reinforcing catastrophic thinking pattern.  Lodema acknowledged that she needs to focus more on rational thinking instead of allowing herself to become emotional and assume worst case scenarios, stating "I have gotten better about it, but sometimes I can't help but worry".  Clinician inquired about other areas of progress which have contributed to [name]'s mood shift.  [name] reported that she isolated herself for a few days following return from recent trip for her cousin's birthday, but her pastor, sister, and friends from bible study made  efforts to draw her out, and get her to laugh and lighten up.  [name] reported that she also surprised herself by asking to talk  with the dance coordinator at church about recent issues, and utilized previously discussed assertive communication skills to advocate for herself and express repressed feelings in a calm, clear manner, which led to greater understanding between them.  She stated "We are gonna start working together now and I'm tired of not saying anything anymore".  Clinician praised [name] for successful use of support network, as well as Armed forces logistics/support/administrative officer and encouraged her to continue expressing her feelings rather than bottling them up as she has done in the past, noting that these recent events show substantial growth.  Clinician inquired about [name]'s plan for remainder of week to continue improving mood and progress towards goals.  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  She reported that she will make plans to visit family in following week, get exercise when physically able, and stay connected with church congregation due to positive impact it continues having on mental health.  [name] denied having any other issues to discuss today and agreed to follow up in 1 week.  Clinician will continue to monitor.                              Plan: Follow up again in 1 week virtually.    Diagnosis: Major depressive disorder, recurrent, moderate; PTSD    [name], LCSW, LCAS 02/24/20

## 2020-02-29 ENCOUNTER — Ambulatory Visit: Payer: Self-pay | Admitting: Surgery

## 2020-03-02 ENCOUNTER — Ambulatory Visit (INDEPENDENT_AMBULATORY_CARE_PROVIDER_SITE_OTHER): Payer: Medicare Other | Admitting: Licensed Clinical Social Worker

## 2020-03-02 ENCOUNTER — Other Ambulatory Visit (HOSPITAL_COMMUNITY): Payer: Self-pay | Admitting: Surgery

## 2020-03-02 ENCOUNTER — Other Ambulatory Visit: Payer: Self-pay

## 2020-03-02 ENCOUNTER — Other Ambulatory Visit: Payer: Self-pay | Admitting: Surgery

## 2020-03-02 DIAGNOSIS — F4310 Post-traumatic stress disorder, unspecified: Secondary | ICD-10-CM | POA: Diagnosis not present

## 2020-03-02 DIAGNOSIS — F331 Major depressive disorder, recurrent, moderate: Secondary | ICD-10-CM

## 2020-03-02 DIAGNOSIS — R112 Nausea with vomiting, unspecified: Secondary | ICD-10-CM

## 2020-03-02 NOTE — Progress Notes (Signed)
Virtual Visit via Video Note  I connected withRenee Woodon6/16/21at2:00pmbyvideoenabled telemedicine applicationand verified that I am speaking with the correct person using two identifiers.  I discussed the limitations, risks, security and privacy concerns of performing an evaluation and management service by telephone and the availability of in person appointments. I also discussed with the patient that there may be a patient responsible charge related to this service. The patient expressed understanding and agreed to proceed.  I discussed the assessment and treatment plan with the patient. The patient was provided an opportunity to ask questions and all were answered. The patient agreed with the plan and demonstrated an understanding of the instructions.  The patient was advised to call back or seek an in-person evaluation if the symptoms worsen or if the condition fails to improve as anticipated.  I provided45 minutesof non-face-to-face time during this encounter.   Meghan Flood, LCSW, [name] _______________________ THERAPIST PROGRESS NOTE  Session Time:2:00pm -2:45pm   Location: Patient: Patient Home Provider: [place] [name]  Participation Level:Active  Behavioral Response:Alert, casually dressed, depressedmood/affect  Type of Therapy: Individual Therapy  Treatment Goals addressed:Anxietymanagement;Socialization with family; Healthyboundaries and communication skills; Exercise; Writing progress  Interventions:CBT,assertive communication and healthy boundaries  Summary:Meghan [name] is a 54 year old African American female thatpresented for virtual therapy appointment today with Major Depressive Disorder, recurrent, moderate, and PTSD.  Suicidal/Homicidal:None;without plan or intent  Therapist Response:Clinician met with Meghan [name] for virtual therapy appointment.  Clinician assessed for safety and medication compliance.  Meghan [name] presented  for session on time and was alert, oriented x5, with no evidence of SI/HI or A/V H.  Meghan [name] reported ongoing compliance with medications, but noted that she was given a different kind of inhaler upon meeting with pulmonary doctor last Friday, and use of this caused an adverse reaction, so she had to receive a shot on Monday due to worsening symptoms of dizziness, nausea, and vomiting, although she reported feeling more stable today.  Clinician empathized with Meghan [name] and inquired about whether this impacted her emotional ratings as a result, or led to any significant changes in thoughts, feelings, or behavior.  Meghan [name] reported scores of 3/10 for depression, and 0/10 for anxiety today. Meghan [name] reported that the symptoms related to her medication change led to a decrease in sleep, so she is tired today, but her depression is more closely related to family issues.  Clinician inquired about what issues she is currently facing, and how she has been attempting to cope, including previous reinforcement of healthier boundaries with triggering family members.  Meghan [name] reported that on Sunday she spoke with one of her daughters and they had a disagreement about parenting approach for the granddaughter, so this upset Meghan [name] and she had to end the phone call before it escalated.  Meghan [name] reported that she used her breathing to center herself and took a time out, which made her realize that she was powerless to influence the situation at hand and could not find compromise.  Meghan [name] reported that usually she would isolate after an event like this, but she instead outreached her sister the same day, and they went out and had dinner and talked about it.  Meghan [name] also reported that when her daughter called again over following days, she maintained stronger boundaries by not answering immediately, and also outreached a church friend a few days later for more support, stating "She is like a mother figure to me.  I trust her and know I can look to  her for advice".  Meghan [name] reported that  by talking with these people, she felt better and it allowed her to focus more on what she can control, such as her own actions each day.  Clinician praised Meghan [name] for ongoing commitment to change and use of supports to curb isolation following difficult events.  Clinician inquired about Meghan [name] action plan for remainder of week/weekend to stay on track with progress and manage mood disturbances.  Meghan [name] reported that since she is still very tired from recent sickness, she will sleep for a few hours after session, get up at 5pm to eat dinner, then attend bible study virtually at 6:30pm, another church meeting at [facility], and go to bed thereafter.  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  Intervention was effective, as evidenced by Meghan [name] reporting that this session was helpful for receiving positive feedback, and allowing her space to express difficult feelings that she is working to avoid bottling up any longer.  Clinician concluded session at 45 minutes due to Meghan [name] increasing fatigue, encouraged her to lay down afterward, and will continue to monitor.              Plan:Follow up again in 1 week virtually.   Diagnosis: Major depressive disorder, recurrent, moderate; PTSD  Meghan [name] 03/02/20

## 2020-03-09 ENCOUNTER — Ambulatory Visit (HOSPITAL_COMMUNITY): Payer: Medicare Other | Admitting: Licensed Clinical Social Worker

## 2020-03-10 ENCOUNTER — Other Ambulatory Visit: Payer: Self-pay

## 2020-03-10 ENCOUNTER — Ambulatory Visit (INDEPENDENT_AMBULATORY_CARE_PROVIDER_SITE_OTHER): Payer: Medicare Other | Admitting: Licensed Clinical Social Worker

## 2020-03-10 DIAGNOSIS — F331 Major depressive disorder, recurrent, moderate: Secondary | ICD-10-CM

## 2020-03-10 DIAGNOSIS — F4310 Post-traumatic stress disorder, unspecified: Secondary | ICD-10-CM

## 2020-03-10 NOTE — Progress Notes (Signed)
Virtual Visit via Video Note  I connected withRenee Woodon6/24/21at8:00ambyvideoenabled telemedicine applicationand verified that I am speaking with the correct person using two identifiers.  I discussed the limitations, risks, security and privacy concerns of performing an evaluation and management service by telephone and the availability of in person appointments. I also discussed with the patient that there may be a patient responsible charge related to this service. The patient expressed understanding and agreed to proceed.  I discussed the assessment and treatment plan with the patient. The patient [name] provided an opportunity to ask questions and all were answered. The patient agreed with the plan and demonstrated an understanding of the instructions.  The patient [name] advised to call back or seek an in-person evaluation if the symptoms worsen or if the condition fails to improve as anticipated.  I provided1 hourof non-face-to-face time during this encounter.   [name], LCSW, LCAS _______________________ THERAPIST PROGRESS NOTE  Session Time:8:00am -9:00am  Location: Patient: Patient Home Provider: [place] [facility]  Participation Level:Active  Behavioral Response:Alert, casually dressed, anxious mood/affect  Type of Therapy: Individual Therapy  Treatment Goals addressed:Anxietymanagement;Socialization with family, church; Medical Appointment Attendance   Interventions:CBT  Summary:Meghan [name] is a 54 year old African American female thatpresented for virtual therapy appointment today with diagnoses of Major Depressive Disorder, recurrent, moderate, and PTSD.  Suicidal/Homicidal:None;without plan or intent  Therapist Response:Clinician met with Meghan [name] for virtual therapy session. Clinician assessed for safety and medication compliance. Meghan [name] presented for appointment on time and [name] alert, oriented x5, with no evidence of SI/HI  or A/V [name] that she continues taking medication as prescribed. Clinician inquired about Meghan [name] emotional ratings today, as well as any significant changes in thoughts, feelings, or behavior since last check-in.  Meghan [name] reported scores of 0/10 for depression, and 20/10 for anxiety today, [name] "I have an appointment with my cardiologist today at 1pm, and I'm nervous about an upcoming procedure".  Meghan [name] reported that she has had appointments with doctors each day this week and has an appointment at [facility] tomorrow for all day testing, and will have a biopsy performed, the results of which will need to be assessed by a doctor in [place] next week.  Meghan [name] reported that she fears the results will not be positive, and this has taken much of her focus since last session.  Clinician empathized with Meghan [name] regarding ongoing medical concerns, and explored this fear with her today in order to shift focus more upon aspects of her health and wellbeing that she is able to control at time.  Meghan [name] receptive to this approach, and acknowledged that although she must wait for these tests results and does not know how long she has to live, she can maintain current health by following diet, exercising, keeping up with doctor's appointments regularly, and maintain a calm mindset to avoid letting anxiety overtake her.  Meghan [name] stated "I've got to take my mind off of it".  Clinician also coached Meghan [name] through practice of positive self talk and affirmations that could be utilized to help her cope with upcoming medical challenges.  Alyscia successfully engaged in this exercise and stated "I can't go into that procedure with fear on my mind.  I have to have a positive outlook and know everything will turn out fine".  [name] also reported that she will speak with her pastor and church community members ahead of appointment for additional support, [name] "They prayed for me the other day and that made me feel really good".  Intervention [name] effective, as evidenced by Meghan [name] "Talking about this made me feel better about tomorrow.  I'll have all the support I need even if they aren't there physically".  Meghan [name] reported that as a result of discussion, her anxiety dropped from severity of 20/10 down to a 10/10, [name] "I'm still a little anxious, but I know it will be okay".  Clinician encouraged [name] to get ample rest ahead of appointment later today and will continue to monitor.   Plan:Follow up again in 1 week virtually.   Diagnosis: Major depressive disorder, recurrent, moderate; PTSD  [name], LCAS 03/10/20

## 2020-03-11 ENCOUNTER — Encounter (HOSPITAL_COMMUNITY)
Admission: RE | Admit: 2020-03-11 | Discharge: 2020-03-11 | Disposition: A | Discharge status: Home / Self Care | Payer: Medicare Other | Source: Ambulatory Visit | Attending: Surgery | Admitting: Surgery

## 2020-03-11 ENCOUNTER — Other Ambulatory Visit: Payer: Self-pay

## 2020-03-11 DIAGNOSIS — R112 Nausea with vomiting, unspecified: Secondary | ICD-10-CM | POA: Diagnosis not present

## 2020-03-11 MED ORDER — TECHNETIUM TC 99M SULFUR COLLOID
2.0000 | Freq: Once | INTRAVENOUS | Status: AC | PRN
  Administered 2020-03-11: 2 via ORAL

## 2020-03-16 ENCOUNTER — Other Ambulatory Visit: Payer: Self-pay

## 2020-03-16 ENCOUNTER — Ambulatory Visit (INDEPENDENT_AMBULATORY_CARE_PROVIDER_SITE_OTHER): Payer: Medicare Other | Admitting: Licensed Clinical Social Worker

## 2020-03-16 DIAGNOSIS — F331 Major depressive disorder, recurrent, moderate: Secondary | ICD-10-CM

## 2020-03-16 DIAGNOSIS — F4310 Post-traumatic stress disorder, unspecified: Secondary | ICD-10-CM | POA: Diagnosis not present

## 2020-03-16 NOTE — Progress Notes (Signed)
Virtual Visit via Video Note   I connected with Meghan [name] on 03/16/20 at 2:00pm by video enabled telemedicine application and verified that I am speaking with the correct person using two identifiers.   I discussed the limitations, risks, security and privacy concerns of performing an evaluation and management service by telephone and the availability of in person appointments. I also discussed with the patient that there may be a patient responsible charge related to this service. The patient expressed understanding and agreed to proceed.   I discussed the assessment and treatment plan with the patient. The patient [name] provided an opportunity to ask questions and all were answered. The patient agreed with the plan and demonstrated an understanding of the instructions.   The patient [name] advised to call back or seek an in-person evaluation if the symptoms worsen or if the condition fails to improve as anticipated.   I provided 45 minutes of non-face-to-face time during this encounter.     [name], LCSW, LCAS _______________________ THERAPIST PROGRESS NOTE   Session Time: 2:00pm - 2:45pm  Location: Patient: Patient Home Provider: [place] [facility]    Participation Level: Active    Behavioral Response: Alert, casually dressed, anxious mood/affect   Type of Therapy:  Individual Therapy   Treatment Goals addressed: Anxiety management; Socialization with family, church; Medical Appointment Attendance; Novel progress; Boundaries with family   Interventions: CBT, treatment planning   Summary: Meghan [name] is a 54 year old African American female that presented for virtual therapy appointment today with diagnoses of Major Depressive Disorder, recurrent, moderate, and PTSD.      Suicidal/Homicidal: None; without plan or intent    Therapist Response:  Clinician met with Meghan [name] for virtual therapy appointment and assessed for safety and medication compliance.  Meghan [name] presented for session on time  and [name] alert, oriented x5, with no evidence of SI/HI or A/V H.  Meghan [name] reported ongoing compliance with medication.  Clinician inquired about Meghan [name]'s current emotional ratings, as well as any significant changes in thoughts, feelings, or behavior since previous session.  Meghan [name] reported scores of 0/10 for depression, and 1/10 for anxiety today.  She reported that she [name] able to attend medical procedure without issue Friday and remaining anxiety related to this [name] alleviated by speaking with family and church members beforehand.  Meghan [name] reported that she will go to [place] tomorrow on a 7am train ride to see a specialist about the results, and then plans to spend time with her family upon return.  Clinician congratulated Meghan [name] on successful results, effective use of support system, and positive recovery plan, and inquired about how progress is coming along on book.  Meghan [name] reported that due to appointments, she has been dedicating less time, and needs to ensure 1 hour daily spent towards this to meet July deadline.  Meghan [name] reported that she did not have any pressing issues that needed to be processed today, so clinician proposed revisiting treatment plan to make updates/revisions as needed due to 90 days passing since last changes made. Meghan [name] agreeable to this, and collaborated with clinician to make following changes:  Meet with clinician once per week for virtual therapy sessions to update on goal progress and address any needs that arise; Maintain depression at average daily severity of 0/10-1/10 for the next 90 days by attending weekly therapy sessions, and engaging in healthy self-care activities daily to keep mind engaged such as reading, going for walks in the park, and speaking with family x3 weekly for support;  Reduce average daily anxiety level from severity of 5/10 down to a 3/10 in next 90 days by practicing relaxation techniques with proven efficacy 2-3x daily, in addition to challenging anxious thoughts  that arise to negate negative impact on outlook;  Exercise for at least 30 minutes of cardio daily, in addition to following heart-healthy diet to improve both physical and mental well-being per PCP recommendations; Attend appointments with cardiologist PRN and PCP MD x1 per month to address current and any future physical health needs that may arise during treatment; Maintain healthy sleep routine throughout week to ensure average of 8-9 hours rest nightly to reduce irritability, increase focus, and maintain daily motivation;  Attend church meetings with clergy members weekly on Monday, Tuesday and Wednesday to stay productive, engaged with supportive community, and maintain spiritual support, in addition to any other available activities (volunteering at soup kitchen) that can be added into schedule without leading to imbalance; Maintain healthier boundaries with daughter to avoid worsening anxiety and depression, as well as enforce assertive communication skills, with goal of limiting contact with daughter until more respectful communication patterns are established; Commit to at least 1 hour of daily writing to stay on track with completion of book by end of July 2021 and achieve creative emotional outlet/release from difficult events in past;  Voluntarily seek admission to hospital with assistance from family members or medical professionals if Meghan [name] begins to experience SI/HI or A/V H and safety is determined to be in danger.  Meghan [name] reported that she would follow up next week once she returns from [place].  Clinician will continue to monitor.                                        Plan: Follow up again in 1 week virtually.    Diagnosis: Major depressive disorder, recurrent, moderate; PTSD    [name], LCSW, LCAS 03/16/20

## 2020-03-22 ENCOUNTER — Ambulatory Visit (INDEPENDENT_AMBULATORY_CARE_PROVIDER_SITE_OTHER): Payer: Medicare Other | Admitting: Licensed Clinical Social Worker

## 2020-03-22 ENCOUNTER — Other Ambulatory Visit: Payer: Self-pay

## 2020-03-22 DIAGNOSIS — F331 Major depressive disorder, recurrent, moderate: Secondary | ICD-10-CM

## 2020-03-22 DIAGNOSIS — F4310 Post-traumatic stress disorder, unspecified: Secondary | ICD-10-CM | POA: Diagnosis not present

## 2020-03-22 NOTE — Progress Notes (Signed)
Virtual Visit via Video Note   I connected with Meghan [name] on 03/22/20 at 11:00am by video enabled telemedicine application and verified that I am speaking with the correct person using two identifiers.   I discussed the limitations, risks, security and privacy concerns of performing an evaluation and management service by telephone and the availability of in person appointments. I also discussed with the patient that there may be a patient responsible charge related to this service. The patient expressed understanding and agreed to proceed.   I discussed the assessment and treatment plan with the patient. The patient was provided an opportunity to ask questions and all were answered. The patient agreed with the plan and demonstrated an understanding of the instructions.   The patient was advised to call back or seek an in-person evaluation if the symptoms worsen or if the condition fails to improve as anticipated.   I provided 30 minutes of non-face-to-face time during this encounter.     Meghan Flood, LCSW, LCAS _______________________ THERAPIST PROGRESS NOTE   Session Time: 11:00am - 11:30am  Location: Patient: Patient Home Provider: [place] [facility]    Participation Level: Active    Behavioral Response: Alert, casually dressed, anxious mood/affect   Type of Therapy:  Individual Therapy   Treatment Goals addressed: Anxiety management; Socialization with family; Medical Appointment Attendance   Interventions: CBT, challenging automatic negative thoughts   Summary: Meghan [name] is a 54 year old African American female that presented for virtual therapy appointment today with diagnoses of Major Depressive Disorder, recurrent, moderate, and PTSD.      Suicidal/Homicidal: None; without plan or intent    Therapist Response:  Clinician met with Meghan [name] for virtual therapy session and assessed for safety and medication compliance.  Meghan [name] presented for appointment on time and was alert, oriented  x5, with no evidence of SI/HI or A/V H.  Meghan [name] reported ongoing compliance with medication.  Clinician inquired about Meghan [name]'s emotional ratings today, as well as any significant changes in thoughts, feelings, or behavior since last appointment.  Meghan [name] reported scores of 0/10 for depression, and 6/10 for anxiety today and stated "I was at a 10 with anxiety earlier at my doctor's appointment".  Meghan [name] reported that upon receiving news of her recent liver tests, she had a panic attack, but was able to calm down by breathing in the office with her doctor.  Meghan [name] reported that these tests showed she has an enlarged liver, and extra tissue surrounding it, so now she will need to have an ultrasound and additional tests done on the 12th, so she has been increasingly anxious about this.  Clinician worked with Meghan [name] in session today to explore automatic negative thoughts related to this medical related anxiety, and identify strategies to disrupt this pattern to avoid worsening impact on mood and outlook.  Intervention was effective, as evidenced by Meghan [name] noting that although she still engages in catastrophic thinking at times where she imagines the worst possible outcome, she acknowledged that it provided relief to challenge this by thinking of positive aspects of the current situation instead, including the fact that it was detected early, she has family visiting in following days to support her, and the possible diagnoses are both treatable conditions.  Meghan [name] stated "I'm worried, but I'm not going to let it get to me.  I know I'll get through this some kind of way".  Clinician encouraged Meghan [name] to keep in touch with her doctors, attend all upcoming appointments, and use relaxation techniques as needed  in addition to following daily self-care routine to reinforce more positive outlook.  Meghan [name] reported that her sleep has been poor, so she will take time to rest today to get her energy up, and was agreeable to suggestions.   Clinician will continue to monitor.                                        Plan: Follow up again in 1 week virtually.    Diagnosis: Major depressive disorder, recurrent, moderate; PTSD    Meghan Flood, LCSW, LCAS 03/22/20

## 2020-03-24 ENCOUNTER — Other Ambulatory Visit: Payer: Self-pay | Admitting: *Deleted

## 2020-03-24 ENCOUNTER — Encounter: Payer: Self-pay | Admitting: *Deleted

## 2020-03-24 DIAGNOSIS — M7989 Other specified soft tissue disorders: Secondary | ICD-10-CM

## 2020-03-25 ENCOUNTER — Other Ambulatory Visit: Payer: Self-pay

## 2020-03-25 ENCOUNTER — Encounter: Payer: Self-pay | Admitting: Neurology

## 2020-03-25 ENCOUNTER — Ambulatory Visit (INDEPENDENT_AMBULATORY_CARE_PROVIDER_SITE_OTHER): Payer: Medicare Other | Admitting: Neurology

## 2020-03-25 VITALS — BP 118/68 | HR 65 | Ht 65.0 in | Wt 320.2 lb

## 2020-03-25 DIAGNOSIS — R569 Unspecified convulsions: Secondary | ICD-10-CM | POA: Diagnosis not present

## 2020-03-25 DIAGNOSIS — Z87828 Personal history of other (healed) physical injury and trauma: Secondary | ICD-10-CM | POA: Insufficient documentation
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
## 2020-03-26 LAB — VITAMIN B12: Vitamin B-12: 773 pg/mL (ref 232–1245)

## 2020-03-26 LAB — TSH: TSH: 2.22 u[IU]/mL (ref 0.450–4.500)

## 2020-03-26 LAB — CK: Total CK: 45 U/L (ref 32–182)

## 2020-03-28 ENCOUNTER — Ambulatory Visit (INDEPENDENT_AMBULATORY_CARE_PROVIDER_SITE_OTHER): Payer: Medicare Other | Admitting: Physician Assistant

## 2020-03-28 ENCOUNTER — Ambulatory Visit (INDEPENDENT_AMBULATORY_CARE_PROVIDER_SITE_OTHER): Payer: Medicare Other | Admitting: Licensed Clinical Social Worker

## 2020-03-28 ENCOUNTER — Telehealth: Payer: Self-pay | Admitting: Neurology

## 2020-03-28 ENCOUNTER — Other Ambulatory Visit: Payer: Self-pay

## 2020-03-28 ENCOUNTER — Ambulatory Visit (HOSPITAL_COMMUNITY)
Admission: RE | Admit: 2020-03-28 | Discharge: 2020-03-28 | Disposition: A | Discharge status: Home / Self Care | Payer: Medicare Other | Source: Ambulatory Visit | Attending: Surgery | Admitting: Surgery

## 2020-03-28 ENCOUNTER — Telehealth: Payer: Self-pay | Admitting: *Deleted

## 2020-03-28 VITALS — BP 134/84 | HR 75 | Temp 97.7°F | Resp 20 | Ht 65.0 in | Wt 317.9 lb

## 2020-03-28 DIAGNOSIS — I872 Venous insufficiency (chronic) (peripheral): Secondary | ICD-10-CM | POA: Diagnosis not present

## 2020-03-28 DIAGNOSIS — M7989 Other specified soft tissue disorders: Secondary | ICD-10-CM | POA: Diagnosis not present

## 2020-03-28 DIAGNOSIS — F4310 Post-traumatic stress disorder, unspecified: Secondary | ICD-10-CM | POA: Diagnosis not present

## 2020-03-28 DIAGNOSIS — F331 Major depressive disorder, recurrent, moderate: Secondary | ICD-10-CM

## 2020-03-28 NOTE — Telephone Encounter (Signed)
Medicare/medicaid order sent to GI. No auth they will reach out to the patient to schedule.  °

## 2020-03-28 NOTE — Progress Notes (Signed)
Requested by:  [facility], [place],  [place] [id number]  Reason for consultation: varicose veins with swelling   History of Present Illness   Meghan [name] is a 54 y.o. (1966-03-18) female who presents for evaluation of varicose veins of left lower extremity with swelling and pain. She states that she started noticing the varicose veins and swelling approximately 3 years ago but within the last month her pain has greatly increased. She has extensive history of multiple pulmonary embolisms dating back to the 1990's and also has history of LLE DVT. Her last PE was in 2003 at which time she had an IVC filter placed. She additionally takes Xarelto daily for both DVT prophylaxis as well as for atrial fibrillation.  She thought more recently when her pain in her left leg worsened that she may have another blood clot but she says she had it looked at in the ED and was told she did not have a blood clot.  She describes the pain as a constant aching mostly in the left thigh. Occasionally a cramping pain especially medial left thigh where she has a large varicose vein. This discomfort occurs during the day and also at night sometimes waking her up. She does have arthritis in both of her knees so sometimes it is hard for her to differentiate the pain in her legs when she is ambulating. She takes tylenol arthritis, she has tried topical treatments without relief, Ice, heat and elevation. She has some relief with elevation but she feels that mostly it helps her knees feel better versus her legs. Her swelling is pretty constant but will worsen on prolonged standing and ambulation. She denies any symptoms in her right thigh or leg. 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 Arthritis    left knee  . Asthma   . Atrial fibrillation ([place])   . CHF (congestive heart failure) ([place]) 01/2019   Physician from [place] informed her she had this   . Dysrhythmia    Atrial fibrillation  . Headache   . Hiatal hernia   . History of pulmonary embolus (PE)   . Hypercholesterolemia   . Macular degeneration    of left eye  . Osteoarthritis    knees  . Pneumonia    in the past  . Pulmonary embolism ([place])    bilat  . Seizure ([place])   . Seizure disorder ([place]) 2017  . Sleep apnea CPAP   [facility]  . Stroke [facility])    1989 and 1995 (left sided weakness)  . Vitamin D deficiency     Past Surgical History:  Procedure Laterality Date  . ABDOMINAL HYSTERECTOMY  2003   complete  . CARDIAC CATHETERIZATION  2017   in [facility] in [place]  . CESAREAN SECTION  [id number]   x 2  . CESAREAN SECTION     x 2  . CHOLECYSTECTOMY  2003  . COLONOSCOPY WITH PROPOFOL N/A 06/05/2019   Procedure: COLONOSCOPY WITH PROPOFOL;  Surgeon: Carol Ada, MD;  Location: [facility] ENDOSCOPY;  Service: Endoscopy;  Laterality: N/A;  .  HERNIA REPAIR  1740, [id number]   umbilical hernia repair  . IVC FILTER INSERTION  2003   Hx pulmonary embolus  . POLYPECTOMY  06/05/2019   Procedure: POLYPECTOMY;  Surgeon: Carol Ada, MD;  Location: [facility] ENDOSCOPY;  Service: Endoscopy;;  . SHOULDER ARTHROSCOPY W/ ROTATOR CUFF REPAIR  08/28/2017   right shoulder    Social History   Socioeconomic History  . Marital status: Married    Spouse name: Not on file  . Number of children: 3  . Years of education: Not on file  . Highest education level: Not on file  Occupational History  . Not on file  Tobacco Use  . Smoking status: Never Smoker  . Smokeless tobacco: Never Used  Vaping Use  .  Vaping Use: Never used  Substance and Sexual Activity  . Alcohol use: Never  . Drug use: Never  . Sexual activity: Not on file  Other Topics Concern  . Not on file  Social History Narrative   ** Merged History Encounter **       Social Determinants of Health   Financial Resource Strain:   . Difficulty of Paying Living Expenses:   Food Insecurity:   . Worried About Charity fundraiser in the Last Year:   . Arboriculturist in the Last Year:   Transportation Needs:   . Film/video editor (Medical):   Marland Kitchen Lack of Transportation (Non-Medical):   Physical Activity:   . Days of Exercise per Week:   . Minutes of Exercise per Session:   Stress:   . Feeling of Stress :   Social Connections:   . Frequency of Communication with Friends and Family:   . Frequency of Social Gatherings with Friends and Family:   . Attends Religious Services:   . Active Member of Clubs or Organizations:   . Attends Archivist Meetings:   Marland Kitchen Marital Status:   Intimate Partner Violence:   . Fear of Current or Ex-Partner:   . Emotionally Abused:   Marland Kitchen Physically Abused:   . Sexually Abused:     Family History  Problem Relation Age of Onset  . Breast cancer Mother   . Breast cancer Maternal Grandmother     Current Outpatient Medications  Medication Sig Dispense Refill  . acetaminophen (TYLENOL 8 HOUR) 650 MG CR tablet Take 1 tablet (650 mg total) by mouth every 8 (eight) hours as needed for pain. 30 tablet 0  . albuterol (VENTOLIN HFA) 108 (90 Base) MCG/ACT inhaler Inhale 2 puffs into the lungs every 6 (six) hours as needed for wheezing or shortness of breath.    Marland Kitchen BREZTRI AEROSPHERE 160-9-4.8 MCG/ACT AERO     . cycloSPORINE (RESTASIS) 0.05 % ophthalmic emulsion Apply to eye.    . diltiazem (CARDIZEM CD) 120 MG 24 hr capsule Take 240 mg by mouth daily.    . flecainide (TAMBOCOR) 50 MG tablet Take 100 mg by mouth daily with breakfast.     . furosemide (LASIX) 40 MG tablet Take 40 mg by mouth  daily with breakfast.     . metFORMIN (GLUCOPHAGE) 500 MG tablet Take 1,000 mg by mouth daily with breakfast.     . Multiple Vitamins-Minerals (PRESERVISION AREDS 2 PO) Take 2 capsules by mouth daily with breakfast.     . naproxen (NAPROSYN) 500 MG tablet Take 1 tablet (500 mg total) by mouth 2 (two) times daily. 30 tablet 0  . Omega-3 Fatty Acids (FISH OIL) 1000 MG CAPS Take 1,000  mg by mouth daily with breakfast.     . ondansetron (ZOFRAN ODT) 4 MG disintegrating tablet Take 1 tablet (4 mg total) by mouth every 8 (eight) hours as needed for nausea or vomiting. 20 tablet 0  . OZEMPIC, 0.25 OR 0.5 MG/DOSE, 2 MG/1.5ML SOPN Apply 0.25 mg topically once a week.    . rivaroxaban (XARELTO) 20 MG TABS tablet Take 20 mg by mouth daily with supper.     Marland Kitchen tiZANidine (ZANAFLEX) 4 MG tablet Take 4 mg by mouth at bedtime as needed for muscle spasms.     . Vitamin D, Ergocalciferol, (DRISDOL) 1.25 MG (50000 UNIT) CAPS capsule Take 50,000 Units by mouth once a week.     No current facility-administered medications for this visit.    Allergies  Allergen Reactions  . Other Hives    Muscle relaxer that starts with a "t" caused hives (not tizanidine) January or February 2020  . Acetaminophen-Codeine Swelling and Rash    Tylenol with Codeine, Tylenol #3,  (facial swelling)  . Coconut Oil Rash    ANY coconut products   . Meloxicam Rash  . Tomato Rash    REVIEW OF SYSTEMS (negative unless checked):   Cardiac:  []  Chest pain or chest pressure? []  Shortness of breath upon activity? []  Shortness of breath when lying flat? []  Irregular heart rhythm?  Vascular:  []  Pain in calf, thigh, or hip brought on by walking? []  Pain in feet at night that wakes you up from your sleep? []  Blood clot in your veins? []  Leg swelling?  Pulmonary:  []  Oxygen at home? []  Productive cough? []  Wheezing?  Neurologic:  []  Sudden weakness in arms or legs? []  Sudden numbness in arms or legs? []  Sudden onset of  difficult speaking or slurred speech? []  Temporary loss of vision in one eye? []  Problems with dizziness?  Gastrointestinal:  []  Blood in stool? []  Vomited blood?  Genitourinary:  []  Burning when urinating? []  Blood in urine?  Psychiatric:  []  Major depression  Hematologic:  []  Bleeding problems? []  Problems with blood clotting?  Dermatologic:  []  Rashes or ulcers?  Constitutional:  []  Fever or chills?  Ear/Nose/Throat:  []  Change in hearing? []  Nose bleeds? []  Sore throat?  Musculoskeletal:  []  Back pain? []  Joint pain? []  Muscle pain?   Physical Examination     Vitals:   03/28/20 1329  BP: 134/84  Pulse: 75  Resp: 20  Temp: 97.7 F (36.5 C)  TempSrc: Temporal  SpO2: 100%  Weight: (!) 317 lb 14.4 oz (144.2 kg)  Height: 5\' 5"  (1.651 m)   Body mass index is 52.9 kg/m.  General:  WDWN in NAD; vital signs documented above Gait: Normal HENT: WNL, normocephalic Pulmonary: normal non-labored breathing without wheezing Cardiac: regular HR, without  Murmurs without carotid bruit Abdomen: morbidly obese, soft Vascular Exam/Pulses:2+ bilateral radial pulses, 2+ bilateral femoral, DP and PT pulses bilaterally  Extremities: with varicose veins of right thigh and leg, without reticular veins, without edema, with stasis pigmentation in the distal right medial leg, without lipodermatosclerosis, without ulcers Musculoskeletal: no muscle wasting or atrophy  Neurologic: A&O X 3;  No focal weakness or paresthesias are detected Psychiatric:  The pt has Normal affect.  Non-invasive Vascular Imaging   BLE Venous Insufficiency Duplex (03/28/20):   LLE:  No DVT and SVT  GSV reflux only at [facility]  GSV diameter 0.43-1.10  GSV leaves fascia in the distal thigh  SSV reflux at popliteal fossa  CFV and  femoral vein deep venous reflux  Anterior accessory vein reflux   Medical Decision Making   Mckenna Gamm is a 54 y.o. female who presents with: LLE chronic venous  insufficiency, with varicose veins of the left lower extremity. She has both deep and superficial reflux in the left leg. She possibly would be a candidate for ablation of the GSV although it does leave the fascia in the distal thigh   Based on the patient's history and examination, I recommend conservative therapy with compression, elevation and exercise therapy.  She would greatly benefit from weight reduction although due to her arthritis I know that her exercise therapy is very limited  I discussed with the patient the use of her 20-30 mm thigh high compression stockings for 3 months  She will follow up in 3 months with Dr. Scot Dock or Dr. Oneida Alar  Thank you for allowing [name] to participate in this patient's care.   Karoline Caldwell, PA-C [facility] Office: [phone]  03/28/2020, 2:24 PM  Clinic MD: Trula Slade

## 2020-03-28 NOTE — Progress Notes (Signed)
Virtual Visit via Video Note   I connected with Meghan [name] on 03/28/20 at 11:00am by video enabled telemedicine application and verified that I am speaking with the correct person using two identifiers.   I discussed the limitations, risks, security and privacy concerns of performing an evaluation and management service by telephone and the availability of in person appointments. I also discussed with the patient that there may be a patient responsible charge related to this service. The patient expressed understanding and agreed to proceed.   I discussed the assessment and treatment plan with the patient. The patient was provided an opportunity to ask questions and all were answered. The patient agreed with the plan and demonstrated an understanding of the instructions.   The patient was advised to call back or seek an in-person evaluation if the symptoms worsen or if the condition fails to improve as anticipated.   I provided 45 minutes of non-face-to-face time during this encounter.     Meghan Stain, LCSW, LCAS _______________________ THERAPIST PROGRESS NOTE   Session Time: 11:00am - 11:45am  Location: Patient: Patient Home Provider: [facility] OPT Office    Participation Level: Active    Behavioral Response: Alert, casually dressed, combined anxious and depressed mood, depressed affect   Type of Therapy:  Individual Therapy   Treatment Goals addressed: Anxiety management; Socialization with family and maintaining boundaries, being assertive; Medical Appointment Attendance; Book progress   Interventions: CBT, healthy boundaries, assertive communication skills   Summary: Meghan [name] is a 54 year old African American female that presented for virtual therapy appointment today with diagnoses of Major Depressive Disorder, recurrent, moderate, and PTSD.      Suicidal/Homicidal: None; without plan or intent    Therapist Response:  Clinician met with Meghan [name] for virtual therapy appointment and  assessed for safety and medication compliance.  Meghan [name] presented for session on time and was alert, oriented x5, with no evidence or self-report of SI/HI or A/V H.  Meghan [name] reported that she continues taking medication as prescribed.  Clinician inquired about Meghan [name]'s current emotional ratings, as well as any significant changes in thoughts, feelings, or behavior since previous session.  Meghan [name] reported scores of 10/10 for both depression, and anxiety and stated "I've had a lot going on this week".  Clinician inquired about Meghan [name]'s current stressors which could be contributing to higher ratings.  Meghan [name] reported that some family members have come from [place] to visit, and she is already wishing they would go back home due to their tendency to take advantage of her, stating "It reminds me of home, how it used to be".  Clinician inquired about whether Meghan [name] has been able to maintain healthier boundaries with these family members and reinforce more assertive communication style to successfully turn down unreasonable requests.  Meghan [name] acknowledged that she is not allowing them to stay in her apartment, so this has offered them some physical space apart, and she also successfully turned down their request to borrow money, reminding them that their hotel arrangements had already been discussed before heading down.  Clinician encouraged Meghan [name] to continue addressing these issues as they arise to protect her mental health during this stressful time.  Meghan [name] was agreeable, and reported that she will be having dinner with them tomorrow, and this issue about money is likely to arise again, so she will speak with them tonight about reclarifying expectations.  Meghan [name] also reported that she was already stressed about upcoming medical appointments, as her doctors do not have a definitive diagnosis yet,  so she has 2 appointments to attend in relation to this today, and finds herself worrying frequently about what they might discover.  Clinician  processed related troubling thoughts and feelings with Meghan [name] in order to alleviate anxiety related to medical concerns, and inquired about whether Meghan [name] has spoken with her supports about this concern.  Meghan [name] acknowledged that she has been isolating more, and stated "I know they are there for me, but I feel like a burden".  Clinician assisted Meghan [name] in weighing evidence that might prove or dispute this belief, and pointed out that whenever she has had depressive episodes in the past, her support network has been instrumental in coping successfully.  Meghan [name] acknowledged that her sister and several church members offer to help her all the time without even asking, and this has helped lift mood before, so she will try to be more honest with them about her stress for feedback.  Interventions were effective, as evidenced by Meghan [name] reporting that she felt less depressed (5/10 in severity) upon concluding this session, although she was still a bit anxious (6/10) in severity), and plans to get more rest, work on her book to distract herself in a productive way, and ask her doctors for more clarity on what to expect in future appointments so that she won't worry as much about 'what ifs'.  Meghan [name] reported that a church friend will also be accompanying her to appointment on the 19th she is especially worried about, and she believes this will greatly help.  Clinician will continue to monitor.                                        Plan: Follow up again in 1 week virtually.    Diagnosis: Major depressive disorder, recurrent, moderate; PTSD    Meghan Flood, LCSW, LCAS 03/28/20

## 2020-03-28 NOTE — Telephone Encounter (Signed)
Release faxed to [facility] [phone]

## 2020-04-04 ENCOUNTER — Encounter (HOSPITAL_COMMUNITY): Payer: Self-pay

## 2020-04-04 ENCOUNTER — Emergency Department (HOSPITAL_COMMUNITY): Payer: Medicare Other

## 2020-04-04 ENCOUNTER — Other Ambulatory Visit: Payer: Self-pay

## 2020-04-04 ENCOUNTER — Emergency Department (HOSPITAL_COMMUNITY)
Admission: EM | Admit: 2020-04-04 | Discharge: 2020-04-04 | Disposition: A | Discharge status: Home / Self Care | Payer: Medicare Other | Attending: Emergency Medicine | Admitting: Emergency Medicine

## 2020-04-04 ENCOUNTER — Ambulatory Visit (INDEPENDENT_AMBULATORY_CARE_PROVIDER_SITE_OTHER): Payer: Medicare Other | Admitting: Licensed Clinical Social Worker

## 2020-04-04 DIAGNOSIS — J45909 Unspecified asthma, uncomplicated: Secondary | ICD-10-CM | POA: Diagnosis not present

## 2020-04-04 DIAGNOSIS — F331 Major depressive disorder, recurrent, moderate: Secondary | ICD-10-CM

## 2020-04-04 DIAGNOSIS — R42 Dizziness and giddiness: Secondary | ICD-10-CM | POA: Insufficient documentation

## 2020-04-04 DIAGNOSIS — R079 Chest pain, unspecified: Secondary | ICD-10-CM | POA: Diagnosis not present

## 2020-04-04 DIAGNOSIS — R4689 Other symptoms and signs involving appearance and behavior: Secondary | ICD-10-CM | POA: Insufficient documentation

## 2020-04-04 DIAGNOSIS — F4310 Post-traumatic stress disorder, unspecified: Secondary | ICD-10-CM | POA: Diagnosis not present

## 2020-04-04 LAB — BASIC METABOLIC PANEL
Anion gap: 15 (ref 5–15)
BUN: 17 mg/dL (ref 6–20)
CO2: 20 mmol/L — ABNORMAL LOW (ref 22–32)
Calcium: 10.3 mg/dL (ref 8.9–10.3)
Chloride: 102 mmol/L (ref 98–111)
Creatinine, Ser: 0.8 mg/dL (ref 0.44–1.00)
GFR calc Af Amer: >60 mL/min (ref >60)
GFR calc non Af Amer: >60 mL/min (ref >60)
Glucose, Bld: 94 mg/dL (ref 70–99)
Potassium: 4.6 mmol/L (ref 3.5–5.1)
Sodium: 137 mmol/L (ref 135–145)

## 2020-04-04 LAB — CBC
HCT: 35.2 % — ABNORMAL LOW (ref 36.0–46.0)
Hemoglobin: 11.2 g/dL — ABNORMAL LOW (ref 12.0–15.0)
MCH: 26.3 pg (ref 26.0–34.0)
MCHC: 31.8 g/dL (ref 30.0–36.0)
MCV: 82.6 fL (ref 80.0–100.0)
Platelets: 258 10*3/uL (ref 150–400)
RBC: 4.26 MIL/uL (ref 3.87–5.11)
RDW: 15.4 % (ref 11.5–15.5)
WBC: 6.7 10*3/uL (ref 4.0–10.5)
nRBC: 0 % (ref 0.0–0.2)

## 2020-04-04 LAB — TROPONIN I (HIGH SENSITIVITY)
Troponin I (High Sensitivity): <2 ng/L (ref <18)
Troponin I (High Sensitivity): <2 ng/L (ref <18)

## 2020-04-04 MED ORDER — SODIUM CHLORIDE 0.9% FLUSH: 3.0000 mL | Freq: Once | INTRAVENOUS | Status: DC

## 2020-04-04 NOTE — Progress Notes (Signed)
Virtual Visit via Video Note   I connected with Meghan [name] on 04/04/20 at 11:00am by video enabled telemedicine application and verified that I am speaking with the correct person using two identifiers.   I discussed the limitations, risks, security and privacy concerns of performing an evaluation and management service by telephone and the availability of in person appointments. I also discussed with the patient that there may be a patient responsible charge related to this service. The patient expressed understanding and agreed to proceed.   I discussed the assessment and treatment plan with the patient. The patient was provided an opportunity to ask questions and all were answered. The patient agreed with the plan and demonstrated an understanding of the instructions.   The patient was advised to call back or seek an in-person evaluation if the symptoms worsen or if the condition fails to improve as anticipated.   I provided 1 hour of non-face-to-face time during this encounter.     [name], LCSW, LCAS _______________________ THERAPIST PROGRESS NOTE   Session Time: 11:00am - 12:00pm  Location: Patient: Patient Home Provider: [place] Office    Participation Level: Active    Behavioral Response: Alert, casually dressed, depressed mood/affect   Type of Therapy:  Individual Therapy   Treatment Goals addressed: Anxiety management; Socialization with family and maintaining boundaries; Medical Appointment Attendance   Interventions: CBT, stress management    Summary: Meghan [name] is a 54 year old African American female that presented for virtual therapy appointment today with diagnoses of Major Depressive Disorder, recurrent, moderate, and PTSD.      Suicidal/Homicidal: None; without plan or intent    Therapist Response:  Clinician met with Meghan [name] for virtual session and assessed for safety and medication compliance.  Meghan [name] presented for appointment on time and was alert, oriented x5,  with no evidence or self-report of SI/HI or A/V H.  Meghan [name] reported ongoing compliance with medication.  Clinician inquired about Meghan [name]'s emotional ratings today, as well as any significant changes in thoughts, feelings, or behavior since last appointment.  Meghan [name] reported scores of 2/10 for depression, and 0/10 for anxiety and stated "My anxiety was higher earlier because of my doctor's appointment.  I didn't know what to expect and was feeling scared".  Clinician inquired about how Meghan [name]'s appointment went, and whether she has future followup appointments scheduled.  Meghan [name] reported that the procedure went well, and she will be going to visit her GI doctor later today for more tests.  Meghan [name] reported that while she was being operated on, she apparently had 2 seizures and will likely need to go back on medication to address this.  Meghan [name] stated "I have to get an MRI done on Friday and then see my neurologist next Wednesday to go over the results".  Meghan [name] reported that her doctor believes her seizures to be related to increased stress, so clinician inquired about presence of stressors which could be influencing this issue.  Meghan [name] reported that current stressors include lack of relaxation routine being followed, increase in medical issues and frequent appointments, lack of rest, and recent visit from family members she doesn't always get along with.  Clinician collaborated with Meghan [name] to explore changes to be made this week to reduce severity of stress and frequency of seizures.  Interventions were effective, as evidenced by Meghan [name] reporting that her family has ended their visit in town, so she is reinforcing boundaries again, she plans to continue attending all medical appointments to ensure optimal health, she will  be setting aside ample time for rest each day, and she plans to get back into regular use of relaxation techniques such as deep breathing, positive self-talk, and guided imagery each morning.  Meghan [name] stated "All  I am doing this week is concentrating on me right now and that starts with taking a nap after this meeting".  Clinician will continue to monitor.                                        Plan: Follow up again in 1 week virtually.    Diagnosis: Major depressive disorder, recurrent, moderate; PTSD    [name], LCSW, LCAS 04/04/20

## 2020-04-04 NOTE — ED Notes (Signed)
Patient was being assisted back out to lobby after triage, EKG, and vital signs when patient stood up patient experienced two episodes of feeling light headed. Patient was given and wheelchair and was being wheeled out of triage room when patient stated " oh, I feel really weird." Patient asked to elaborate on same and stated " this is how it feels when I am going to have a seizure. But that can't be what's going to happen I have not had one in a while." Patient then states " I got taken off my seizures meds by my neurologist in [place] because I have not had enough seizures to be on medication. I am supposed to go to a new neurologist on Wednesday to be placed back on Keppra." Patient placed in front of safety screeners and advised patient that she would be called for blood work after shift change. Screeners made aware of same and that patient was not feeling right. This Probation officer was talking to the patient when Probation officer noticed patient was not responding to verbal stimulus and was no longer making eye contact with Probation officer. Patient noted to have several episodes of this. Triage nurse made aware room was needed. Patient wheeled back to treatment room and placed on bed with assistance from [place]. Patient placed back on cardiac monitor, pulse ox, blood pressure monitoring. Seizures pads also placed on bed.

## 2020-04-04 NOTE — ED Provider Notes (Signed)
[facility] Emergency Department Provider Note MRN:  [id number]  Arrival date & time: 04/04/20     Chief Complaint   Chest Pain and Shoulder Pain   History of Present Illness   Meghan [name] is a 54 y.o. year-old female with a history of PE, CHF, A. fib presenting to the ED with chief complaint of chest pain.  Chest pain since yesterday evening, left-sided with shoulder pain.  Described as burning but also worse with movement of the shoulder.  Worsening reflux recently.  Patient informed staff while in the waiting room that she was feeling "weird" like she was going to have a seizure.  She is currently acting abnormally and unable to provide a full history.  I was unable to obtain an accurate HPI, PMH, or ROS due to the patient's altered mental status.  Level 5 caveat.  Review of Systems  A complete 10 system review of systems was obtained and all systems are negative except as noted in the HPI and PMH.   Patient's Health History    Past Medical History:  Diagnosis Date  . Arthritis    left knee  . Asthma   . Atrial fibrillation ([place])   . CHF (congestive heart failure) ([place]) 01/2019   Physician from [place] informed her she had this   . Dysrhythmia    Atrial fibrillation  . Headache   . Hiatal hernia   . History of pulmonary embolus (PE)   . Hypercholesterolemia   . Macular degeneration    of left eye  . Osteoarthritis    knees  . Pneumonia    in the past  . Pulmonary embolism ([place])    bilat  . Seizure ([place])   . Seizure disorder ([place]) 2017  . Sleep apnea CPAP   [facility]  . Stroke [facility])    1989 and 1995 (left sided weakness)  . Vitamin D deficiency     Past Surgical History:  Procedure Laterality Date  . ABDOMINAL HYSTERECTOMY  2003   complete  . CARDIAC CATHETERIZATION  2017   in [facility] in [place]  . CESAREAN SECTION  [id number]   x 2  . CESAREAN SECTION     x 2  . CHOLECYSTECTOMY  2003  . COLONOSCOPY WITH PROPOFOL N/A  06/05/2019   Procedure: COLONOSCOPY WITH PROPOFOL;  Surgeon: Carol Ada, MD;  Location: [facility] ENDOSCOPY;  Service: Endoscopy;  Laterality: N/A;  . HERNIA REPAIR  2633, 3545   umbilical hernia repair  . IVC FILTER INSERTION  2003   Hx pulmonary embolus  . POLYPECTOMY  06/05/2019   Procedure: POLYPECTOMY;  Surgeon: Carol Ada, MD;  Location: [facility] ENDOSCOPY;  Service: Endoscopy;;  . SHOULDER ARTHROSCOPY W/ ROTATOR CUFF REPAIR  08/28/2017   right shoulder    Family History  Problem Relation Age of Onset  . Breast cancer Mother   . Breast cancer Maternal Grandmother     Social History   Socioeconomic History  . Marital status: Married    Spouse name: Not on file  . Number of children: 3  . Years of education: Not on file  . Highest education level: Not on file  Occupational History  . Not on file  Tobacco Use  . Smoking status: Never Smoker  . Smokeless tobacco: Never Used  Vaping Use  . Vaping Use: Never used  Substance and Sexual Activity  . Alcohol use: Never  . Drug use: Never  . Sexual activity: Not on file  Other Topics Concern  .  Not on file  Social History Narrative   ** Merged History Encounter **       Social Determinants of Health   Financial Resource Strain:   . Difficulty of Paying Living Expenses:   Food Insecurity:   . Worried About Charity fundraiser in the Last Year:   . Arboriculturist in the Last Year:   Transportation Needs:   . Film/video editor (Medical):   Marland Kitchen Lack of Transportation (Non-Medical):   Physical Activity:   . Days of Exercise per Week:   . Minutes of Exercise per Session:   Stress:   . Feeling of Stress :   Social Connections:   . Frequency of Communication with Friends and Family:   . Frequency of Social Gatherings with Friends and Family:   . Attends Religious Services:   . Active Member of Clubs or Organizations:   . Attends Archivist Meetings:   Marland Kitchen Marital Status:   Intimate Partner Violence:   . Fear of  Current or Ex-Partner:   . Emotionally Abused:   Marland Kitchen Physically Abused:   . Sexually Abused:      Physical Exam   Vitals:   04/04/20 0804 04/04/20 1001  BP: 129/86 133/84  Pulse: 71 67  Resp: (!) 25 (!) 22  Temp:    SpO2: 99% 100%    CONSTITUTIONAL: Well-appearing, NAD NEURO:  Alert and oriented to name, intermittently follows commands, moving all extremities EYES:  eyes equal and reactive ENT/NECK:  no LAD, no JVD CARDIO: Regular rate, well-perfused, normal S1 and S2 PULM:  CTAB no wheezing or rhonchi GI/GU:  normal bowel sounds, non-distended, non-tender MSK/SPINE:  No gross deformities, no edema SKIN:  no rash, atraumatic PSYCH:  Appropriate speech and behavior  *Additional and/or pertinent findings included in MDM below  Diagnostic and Interventional Summary    EKG Interpretation  Date/Time:  Monday April 04 2020 06:43:39 EDT Ventricular Rate:  78 PR Interval:    QRS Duration: 102 QT Interval:  374 QTC Calculation: 426 R Axis:   61 Text Interpretation: Sinus rhythm Prolonged PR interval Low voltage, precordial leads Borderline T abnormalities, anterior leads 12 Lead; [place] Confirmed by Gerlene Fee [phone]) on 04/04/2020 7:09:08 AM      Labs Reviewed  BASIC METABOLIC PANEL - Abnormal; Notable for the following components:      Result Value   CO2 20 (*)    All other components within normal limits  CBC - Abnormal; Notable for the following components:   Hemoglobin 11.2 (*)    HCT 35.2 (*)    All other components within normal limits  I-STAT BETA HCG BLOOD, ED ([facility], [facility], [facility] ONLY)  TROPONIN I (HIGH SENSITIVITY)  TROPONIN I (HIGH SENSITIVITY)    CT HEAD WO CONTRAST  Final Result    DG Chest 2 View  Final Result      Medications  sodium chloride flush (NS) 0.9 % injection 3 mL (3 mLs Intravenous Not Given 04/04/20 0730)     Procedures  /  Critical Care Procedures  ED Course and Medical Decision Making  I have reviewed the triage vital signs, the  nursing notes, and pertinent available records from the EMR.  Listed above are laboratory and imaging tests that I personally ordered, reviewed, and interpreted and then considered in my medical decision making (see below for details).      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  She is not exhibiting true tonic-clonic seizure behavior, will monitor closely I favor a nonepileptic cause of her spells witnessed by staff at the waiting room.  Regarding her chest pain, she has pain elicited with range of motion of the left shoulder, she has had a recent fall in the documentation, suspect MSK versus GERD.  She does have a PE history but endorses full compliance on Xarelto, making this diagnosis unlikely.  Also considering ACS, EKG is reassuring, awaiting troponin.  Work-up is normal, troponin negative x2, chest x-ray normal, CT head normal.  Patient continues to be resting comfortably with normal vital signs, no clear-cut seizure activity of any kind, no neurological deficits, appropriate for discharge.  Barth Kirks. Sedonia Small, MD [place] [email or web address]  Final Clinical Impressions(s) / ED Diagnoses     ICD-10-CM   1. Chest pain, unspecified type  R07.9   2. Spell of abnormal behavior  R46.89     ED Discharge Orders    None       Discharge Instructions Discussed with and Provided to Patient:     Discharge Instructions     You were evaluated in the Emergency Department and after careful evaluation, we did not find any emergent condition requiring admission or further testing in the hospital.  Your exam/testing today was overall reassuring.  We encourage you to follow-up with your primary care doctor as well as your neurologist for further testing of your seizure-like behavior.  Please return to the Emergency Department if you experience any  worsening of your condition.  Thank you for allowing [name] to be a part of your care.        Maudie Flakes, MD 04/04/20 1030

## 2020-04-04 NOTE — Discharge Instructions (Addendum)
You were evaluated in the Emergency Department and after careful evaluation, we did not find any emergent condition requiring admission or further testing in the hospital.  Your exam/testing today was overall reassuring.  We encourage you to follow-up with your primary care doctor as well as your neurologist for further testing of your seizure-like behavior.  Please return to the Emergency Department if you experience any worsening of your condition.  Thank you for allowing [name] to be a part of your care.

## 2020-04-04 NOTE — ED Triage Notes (Signed)
Patient arrived stating since 6pm yesterday she has been having left sided chest pain with some shortness of breath. Reports some burning in her chest that felt like reflux but worsened overnight.   Patient also has complaints of right shoulder pain over the last few days, no known injury.

## 2020-04-08 ENCOUNTER — Ambulatory Visit
Admission: RE | Admit: 2020-04-08 | Discharge: 2020-04-08 | Disposition: A | Discharge status: Home / Self Care | Payer: Medicare Other | Source: Ambulatory Visit | Attending: Neurology | Admitting: Neurology

## 2020-04-08 ENCOUNTER — Other Ambulatory Visit: Payer: Self-pay

## 2020-04-08 DIAGNOSIS — Z87828 Personal history of other (healed) physical injury and trauma: Secondary | ICD-10-CM

## 2020-04-08 DIAGNOSIS — R569 Unspecified convulsions: Secondary | ICD-10-CM | POA: Diagnosis not present

## 2020-04-08 MED ORDER — GADOBENATE DIMEGLUMINE 529 MG/ML IV SOLN
20.0000 mL | Freq: Once | INTRAVENOUS | Status: AC | PRN
  Administered 2020-04-08: 20 mL via INTRAVENOUS

## 2020-04-11 ENCOUNTER — Ambulatory Visit (INDEPENDENT_AMBULATORY_CARE_PROVIDER_SITE_OTHER): Payer: Medicare Other | Admitting: Licensed Clinical Social Worker

## 2020-04-11 ENCOUNTER — Other Ambulatory Visit: Payer: Self-pay

## 2020-04-11 DIAGNOSIS — F331 Major depressive disorder, recurrent, moderate: Secondary | ICD-10-CM | POA: Diagnosis not present

## 2020-04-11 DIAGNOSIS — F4310 Post-traumatic stress disorder, unspecified: Secondary | ICD-10-CM | POA: Diagnosis not present

## 2020-04-11 NOTE — Progress Notes (Signed)
Virtual Visit via Video Note   I connected with Meghan [name] on 04/11/20 at 11:00am by video enabled telemedicine application and verified that I am speaking with the correct person using two identifiers.   I discussed the limitations, risks, security and privacy concerns of performing an evaluation and management service by telephone and the availability of in person appointments. I also discussed with the patient that there may be a patient responsible charge related to this service. The patient expressed understanding and agreed to proceed.   I discussed the assessment and treatment plan with the patient. The patient was provided an opportunity to ask questions and all were answered. The patient agreed with the plan and demonstrated an understanding of the instructions.   The patient was advised to call back or seek an in-person evaluation if the symptoms worsen or if the condition fails to improve as anticipated.   I provided 1 hour of non-face-to-face time during this encounter.     [name], LCSW, LCAS _______________________ THERAPIST PROGRESS NOTE   Session Time: 11:00am - 12:00pm  Location: Patient: Patient Home Provider: [place] Office    Participation Level: Active    Behavioral Response: Alert, casually dressed, depressed mood/affect   Type of Therapy:  Individual Therapy   Treatment Goals addressed: Anxiety/depression management   Interventions: CBT, stress management, assertive communication   Summary: Meghan [name] is a 54 year old African American female that presented for virtual therapy appointment today with diagnoses of Major Depressive Disorder, recurrent, moderate, and PTSD.      Suicidal/Homicidal: None; without plan or intent    Therapist Response:  Clinician met with Meghan [name] for virtual appointment and assessed for safety and medication compliance.  Meghan [name] presented for session on time and was alert, oriented x5, with no evidence or self-report of SI/HI or A/V H.   Meghan [name] reported that she continues taking medication as prescribed.  Clinician inquired about Meghan [name]'s current emotional ratings, as well as any significant changes in thoughts, feelings, or behavior since previous check-in.  Meghan [name] reported scores of 10/10 for depression, and 0/10 for anxiety and stated "I've been in a 'funk' for the past 3 days and haven't really wanted to be bothered".  Meghan [name] denied occurrences of any panic attacks since last session.  Clinician inquired about recent events in previous week which might have influenced depression and increased isolation, including contributing stressors.  Meghan [name] reported that her biggest issue has been dealing with an upstairs neighbor that is very loud throughout day and night, and appears to be trying to antagonize her.  Meghan [name] reported that she also had a negative reaction to an antibiotic she was prescribed which led to side effects like fever, vomiting, and headaches, although she has since then been in touch with her doctor, had medications changed, and feels like her condition is improving.  Meghan [name] reported that she has also felt more bored recently, which has caused her to focus more on stressors overall.  Clinician empathized with Meghan [name] and shifted focus of conversation towards exploring realistic solutions which might resolve these issues, and provide some relief from depression.  These included assertively addressing ongoing issues with neighbor, or speaking with landlord about noise policies, and revisiting self-care routine to ensure schedule includes healthy distractions to shift attention from stressors, provide outlet, and reduce boredom.  Interventions were effective, as evidenced by Meghan [name] reporting that talking about these issues today 'pulled me out of my funk a lot', and helped her think of ideas regarding how to  improve situation.  Meghan [name] reported that she would go speak directly with her landlord after session today to address her complaints  regarding the neighbor, and to reduce boredom she would try to work on her novel, watch TV, listen to music, call her supports, and go out of town for a few days at the beginning of August to visit family.  Clinician will continue to monitor.                                        Plan: Follow up again in 1 week virtually.    Diagnosis: Major depressive disorder, recurrent, moderate; PTSD    [name], LCSW, LCAS 04/11/20

## 2020-04-13 ENCOUNTER — Ambulatory Visit (INDEPENDENT_AMBULATORY_CARE_PROVIDER_SITE_OTHER): Payer: Medicare Other | Admitting: Neurology

## 2020-04-13 DIAGNOSIS — R569 Unspecified convulsions: Secondary | ICD-10-CM

## 2020-04-13 DIAGNOSIS — Z87828 Personal history of other (healed) physical injury and trauma: Secondary | ICD-10-CM

## 2020-04-18 ENCOUNTER — Other Ambulatory Visit: Payer: Self-pay

## 2020-04-18 ENCOUNTER — Ambulatory Visit (INDEPENDENT_AMBULATORY_CARE_PROVIDER_SITE_OTHER): Payer: Medicare Other | Admitting: Licensed Clinical Social Worker

## 2020-04-18 DIAGNOSIS — F331 Major depressive disorder, recurrent, moderate: Secondary | ICD-10-CM

## 2020-04-18 DIAGNOSIS — F4310 Post-traumatic stress disorder, unspecified: Secondary | ICD-10-CM

## 2020-04-18 NOTE — Progress Notes (Signed)
Virtual Visit via Video Note   I connected with Meghan [name] on 04/18/20 at 2:00pm by video enabled telemedicine application and verified that I am speaking with the correct person using two identifiers.   I discussed the limitations, risks, security and privacy concerns of performing an evaluation and management service by telephone and the availability of in person appointments. I also discussed with the patient that there may be a patient responsible charge related to this service. The patient expressed understanding and agreed to proceed.   I discussed the assessment and treatment plan with the patient. The patient was provided an opportunity to ask questions and all were answered. The patient agreed with the plan and demonstrated an understanding of the instructions.   The patient was advised to call back or seek an in-person evaluation if the symptoms worsen or if the condition fails to improve as anticipated.   I provided 30 minutes of non-face-to-face time during this encounter.     [name], LCSW, LCAS _______________________ THERAPIST PROGRESS NOTE   Session Time: 2:00pm - 2:30pm  Location: Patient: Patient Home Provider: [place] Office    Participation Level: Active    Behavioral Response: Alert, casually dressed, depressed mood/affect   Type of Therapy:  Individual Therapy   Treatment Goals addressed: Anxiety/depression management; medication management   Interventions: CBT, healthy boundaries, stress management   Summary: Meghan [name] is a 54 year old African American female that presented for virtual therapy appointment today with diagnoses of Major Depressive Disorder, recurrent, moderate, and PTSD.      Suicidal/Homicidal: None; without plan or intent    Therapist Response:  Clinician met with Meghan [name] for virtual session and assessed for safety, sobriety, and medication compliance.  Meghan [name] presented for appointment on time and was alert, oriented x5, with no evidence or  self-report of SI/HI or A/V H.  Meghan [name] reported ongoing compliance with medication and denied any use of alcohol or illicit substances.  Clinician inquired about Meghan [name]'s emotional ratings today, as well as any significant changes in thoughts, feelings, or behavior since last check-in.  Meghan [name] reported scores of 5/10 for both depression, and anxiety and stated "I'm doing a little better, but on Saturday I was feeling like I didn't want to be bothered".  Clinician inquired about recent events which have influenced Meghan [name]'s mood, and how she attempted to cope.  Meghan [name] reported that on Friday she was taking her grandson on a bus ride and met a man, and they arranged to have dinner the following day, but he stood her up.  Meghan [name] reported that she was both hurt and angered by this event, and initially she isolated and would not answer phone calls from her support system asking how things went.  Meghan [name] reported that eventually she did talk to her sister, and stated "That did help a little bit to get things off my chest".  Meghan [name] reported that this man apologized and outreached her again the following day to try again, but she went to church instead, where she was surrounded by positive supports, and heard a good sermon.  Meghan [name] stated "My preacher said not to give up or throw in the towel when things are tough, and it was like he was speaking directly to me, so I just started crying".  Clinician revisited subject of healthy boundaries with Meghan [name] and their impact upon her mental health, emphasizing importance of engaging with positive supports who share similar values and treat her with respect.  Meghan [name] stated "I felt a  whole lot better after that sermon.  I'm trying to just spend time around people who care about me won't let me down".  Meghan [name] reported that she also has been stressed because of ongoing medical appointments, stating It feel like its one thing after another.  Clinician discussed healthy distractions Meghan [name] could  include in routine to avoid rumination upon medical anxiety.  Intervention was effective, as evidenced by Meghan [name] reporting that she is getting better at distracting herself from negative thinking, and would engage in church services, writing, talking with her sister, going on walks, and visiting the mall with a friend until her early morning biopsy Wednesday. Clinician will continue to monitor.                                        Plan: Follow up again in 1 week virtually.    Diagnosis: Major depressive disorder, recurrent, moderate; PTSD    [name], LCSW, LCAS 04/18/20

## 2020-04-21 NOTE — Procedures (Signed)
   HISTORY: 54 year old female passing out episode  TECHNIQUE:  This is a routine 16 channel EEG recording with one channel devoted to a limited EKG recording.  It was performed during wakefulness, drowsiness and asleep.  Hyperventilation and photic stimulation were performed as activating procedures.  There are minimum muscle and movement artifact noted.  Upon maximum arousal, posterior dominant waking rhythm consistent of rhythmic alpha range activity, with frequency of 10 hz. Activities are symmetric over the bilateral posterior derivations and attenuated with eye opening.  Hyperventilation produced mild/moderate buildup with higher amplitude and the slower activities noted.  Photic stimulation did not alter the tracing.  During EEG recording, patient developed drowsiness and no deeper stage of sleep was achieved During EEG recording, there was no epileptiform discharge noted.  EKG demonstrate sinus rhythm, with heart rate of 88 bpm  CONCLUSION: This is a  normal awake EEG.  There is no electrodiagnostic evidence of epileptiform discharge.  Marcial Pacas, M.D. Ph.D.  [facility] [place], [place] [address] Phone: [phone] Fax:      [phone]

## 2020-04-25 ENCOUNTER — Ambulatory Visit (INDEPENDENT_AMBULATORY_CARE_PROVIDER_SITE_OTHER): Payer: Medicare Other | Admitting: Licensed Clinical Social Worker

## 2020-04-25 ENCOUNTER — Other Ambulatory Visit: Payer: Self-pay

## 2020-04-25 DIAGNOSIS — F4310 Post-traumatic stress disorder, unspecified: Secondary | ICD-10-CM

## 2020-04-25 DIAGNOSIS — F331 Major depressive disorder, recurrent, moderate: Secondary | ICD-10-CM | POA: Diagnosis not present

## 2020-04-25 NOTE — Progress Notes (Signed)
Virtual Visit via Telephone Note   I connected with Meghan [name] on 04/25/20 at 3:00pm by telephone and verified that I am speaking with the correct person using two identifiers.   I discussed the limitations, risks, security and privacy concerns of performing an evaluation and management service by telephone and the availability of in person appointments. I also discussed with the patient that there may be a patient responsible charge related to this service. The patient expressed understanding and agreed to proceed.   I discussed the assessment and treatment plan with the patient. The patient was provided an opportunity to ask questions and all were answered. The patient agreed with the plan and demonstrated an understanding of the instructions.   The patient was advised to call back or seek an in-person evaluation if the symptoms worsen or if the condition fails to improve as anticipated.   I provided 30 minutes of non-face-to-face time during this encounter.     [name], LCSW, LCAS _______________________ THERAPIST PROGRESS NOTE   Session Time: 3:00pm - 3:30pm   Location: Patient: Patient Home Provider: [place] Office    Participation Level: Active    Behavioral Response: Alert, euthymic mood   Type of Therapy:  Individual Therapy   Treatment Goals addressed: Anxiety/depression management; medication management; [name] therapist; Medical Appointments; Sleep hygiene; Family boundaries    Interventions: CBT   Summary: Meghan [name] is a 54 year old African American female that presented for telephone appointment today with diagnoses of Major Depressive Disorder, recurrent, moderate, and PTSD.      Suicidal/Homicidal: None; without plan or intent    Therapist Response:  Clinician contacted Meghan [name] by phone today when she did not present for virtual session and assessed for safety, sobriety, and medication compliance.  Meghan [name] answered phone call for session and apologized for not being  able to access virtual meeting today, preferring to use telephone instead.  She spoke in a manner that was alert, oriented x5, with no evidence or self-report of SI/HI or A/V H.  Meghan [name] reported that she has had a lot going on in the past week, as she flew out of [place] to [place] so that she could attend her grandson's birthday party, as well as a funeral for her daughter's godmother.  She reported that she has maintained compliance with medications and denied any use of alcohol or illicit substances.  Clinician inquired about Meghan [name]'s current emotional ratings, as well as any significant changes in thoughts, feelings, or behavior since previous check-in.  Meghan [name] reported scores of 0/10 for both depression, and anxiety and stated "Things are going really well right now".  Clinician inquired about how Meghan [name]'s trip went, and whether she has been able to make additional progress towards goals despite these obligations.  Meghan [name] reported that the party went well, and she was able to keep herself emotionally stable at the funeral, stating "Someone there was having a panic attack and I got her to try the breathing technique for 10 or 15 minutes until she calmed down".  Meghan [name] reported that she was also able to visit a friend and spent time with her daughters too, stating "We are in a better place, one of them cut ties with a bunch of toxic people so she is growing up a bit".  Meghan [name] reported that she also attended her first Sunday back at church following recent surgeries and stated "People were happy to see me and gave me lots of love".  Meghan [name] denied having any issues to address  today, so clinician inquired about Meghan [name]'s self-care plan for remainder of week to help her stay positive and grounded.  Intervention was effective, as evidenced by Meghan [name] reporting that she would get plenty of rest following return to normal routine, stay closely connected with support system, and plan ahead for upcoming upper GI procedure on Friday, stating  "I'm learning not to worry as much with these appointments now.  I know that everything will be alright".  Clinician will continue to monitor.                                        Plan: Follow up again in 1 week virtually.    Diagnosis: Major depressive disorder, recurrent, moderate; PTSD    [name], LCSW, LCAS 04/25/20

## 2020-05-02 ENCOUNTER — Other Ambulatory Visit: Payer: Self-pay

## 2020-05-02 ENCOUNTER — Ambulatory Visit (INDEPENDENT_AMBULATORY_CARE_PROVIDER_SITE_OTHER): Payer: Medicare Other | Admitting: Licensed Clinical Social Worker

## 2020-05-02 DIAGNOSIS — F331 Major depressive disorder, recurrent, moderate: Secondary | ICD-10-CM | POA: Diagnosis not present

## 2020-05-02 DIAGNOSIS — F4310 Post-traumatic stress disorder, unspecified: Secondary | ICD-10-CM | POA: Diagnosis not present

## 2020-05-02 NOTE — Progress Notes (Signed)
Virtual Visit via Video Note   I connected with Meghan [name] on 05/02/20 at 2:00pm by video enabled telemedicine application and verified that I am speaking with the correct person using two identifiers.   I discussed the limitations, risks, security and privacy concerns of performing an evaluation and management service by telephone and the availability of in person appointments. I also discussed with the patient that there may be a patient responsible charge related to this service. The patient expressed understanding and agreed to proceed.   I discussed the assessment and treatment plan with the patient. The patient was provided an opportunity to ask questions and all were answered. The patient agreed with the plan and demonstrated an understanding of the instructions.   The patient was advised to call back or seek an in-person evaluation if the symptoms worsen or if the condition fails to improve as anticipated.   I provided 45 minutes of non-face-to-face time during this encounter.     [name], LCSW, LCAS _______________________ THERAPIST PROGRESS NOTE   Session Time: 2:00pm - 2:45pm   Location: Patient: Patient Home Provider: [facility]    Participation Level: Active    Behavioral Response: Alert, casually dressed, anxious mood/affect   Type of Therapy:  Individual Therapy   Treatment Goals addressed: Anxiety/depression management; medication management; Medical Appointments    Interventions: CBT, guided imagery   Summary: Meghan [name] is a 54 year old African American female that presented for virtual appointment today with diagnoses of Major Depressive Disorder, recurrent, moderate, and PTSD.      Suicidal/Homicidal: None; without plan or intent    Therapist Response:  Clinician met with Meghan [name] for virtual session and assessed for safety, sobriety, and medication compliance.  Meghan [name] presented for session on time and was alert, oriented x5, with no evidence or self-report  of SI/HI or A/V H.  Meghan [name] reported that she has maintained compliance with medications and denied any use of alcohol or illicit substances.  Clinician inquired about Meghan [name]'s emotional ratings today, as well as any significant changes in thoughts, feelings, or behavior since last check-in.  Meghan [name] reported scores of 0/10 for depression and 6/10 for anxiety, stating "I had my endoscopy Friday and they removed some polyps".  Meghan [name] reported that the surgery went well, but she had lost her voice slightly, so she spoke hoarsely today.  Meghan [name] reported that she has another medical appointment in the morning to go over recent test results, so she is slightly anxious about this.  During session, Meghan [name] received a phone call from her doctor's office informing her that she may have come in contact with someone that was COVID positive during recent visit, along with providing instructions on where to go for testing, and symptoms to watch out for.  Meghan [name] reported heightened anxiety following this call, so clinician offered to assist in de-escalation by coaching her through relaxation activity.  Meghan [name] reported that she would like to participate in guided imagery due to past efficacy, so clinician invited her to get comfortable, achieve a relaxing breathing rhythm, and then assisted her in imagining a safe, calm mental space with pleasant sensory details included to relax in for 10 minutes while her anxiety decreased. Intervention was effective, as evidenced by [facility] participating in activity successfully and reporting that she did end up feeling less anxious (decreased down to 3/10 in severity) and was able to imagine a peaceful walk on the beach in [place] with crashing waves and a cool breeze.  Meghan [name] reported that  this made her contemplate going on a vacation in the future as a reward for her hard work recently.  Clinician was supportive of this plan, and encouraged Meghan [name] to continue practice of relaxation techniques, keep up with  medical appointments, and challenge any automatic negative thoughts (I.e. ruminating upon worst case medical scenarios) which may arise and influence racing thoughts.  Clinician will continue to monitor.                                        Plan: Follow up again in 1 week virtually.    Diagnosis: Major depressive disorder, recurrent, moderate; PTSD    [name], LCSW, LCAS 05/02/20

## 2020-05-09 ENCOUNTER — Ambulatory Visit (INDEPENDENT_AMBULATORY_CARE_PROVIDER_SITE_OTHER): Payer: Medicare Other | Admitting: Licensed Clinical Social Worker

## 2020-05-09 ENCOUNTER — Other Ambulatory Visit: Payer: Self-pay

## 2020-05-09 DIAGNOSIS — F331 Major depressive disorder, recurrent, moderate: Secondary | ICD-10-CM

## 2020-05-09 DIAGNOSIS — F4310 Post-traumatic stress disorder, unspecified: Secondary | ICD-10-CM

## 2020-05-09 NOTE — Progress Notes (Signed)
Virtual Visit via Video Note   I connected with Meghan [name] on 05/09/20 at 9:00am by video enabled telemedicine application and verified that I am speaking with the correct person using two identifiers.   I discussed the limitations, risks, security and privacy concerns of performing an evaluation and management service by telephone and the availability of in person appointments. I also discussed with the patient that there may be a patient responsible charge related to this service. The patient expressed understanding and agreed to proceed.   I discussed the assessment and treatment plan with the patient. The patient was provided an opportunity to ask questions and all were answered. The patient agreed with the plan and demonstrated an understanding of the instructions.   The patient was advised to call back or seek an in-person evaluation if the symptoms worsen or if the condition fails to improve as anticipated.   I provided 45 minutes of non-face-to-face time during this encounter.     [name], LCSW, LCAS _______________________ THERAPIST PROGRESS NOTE   Session Time: 9:00am - 9:45am   Location: Patient: Patient Home Provider: [facility]    Participation Level: Active    Behavioral Response: Alert, casually dressed, anxious mood/affect   Type of Therapy:  Individual Therapy   Treatment Goals addressed: Anxiety/depression management; medication management; Medical Appointments    Interventions: CBT   Summary: Meghan [name] is a 54 year old African American female that presented for virtual appointment today with diagnoses of Major Depressive Disorder, recurrent, moderate, and PTSD.      Suicidal/Homicidal: None; without plan or intent    Therapist Response:  Clinician met with Meghan [name] for virtual appointment and assessed for safety, sobriety, and medication compliance.  Meghan [name] presented for appointment on time and was alert, oriented x5, with no evidence or self-report of SI/HI  or A/V H.  Meghan [name] reported ongoing compliance with medications and denied any use of alcohol or illicit substances.  Clinician inquired about Meghan [name]'s current emotional ratings, as well as any significant changes in thoughts, feelings, or behavior since previous check-in.  Meghan [name] reported scores of 0/10 for depression and 3/10 for anxiety, stating "There hasn't been too much going on.  I've been in the house a lot".  Clinician inquired about progress Meghan [name] has made towards goals in last week, along with present barriers to success. Meghan [name] reported that she attended 2 medical appointments, has been working on her book more, and attended a business expo at her church where she sold some of her daughter's jewelry and secured some party planning events in upcoming months for herself.  Meghan [name] reported that the only issue at present is occasionally worrying about an upcoming biopsy that has not been scheduled yet, stating "That's why my anxiety is still there, it pops up in the back of my mind a lot".  Clinician collaborated with Meghan [name] today to identify effective cognitive and behavioral approaches for distracting herself from these anxious thoughts, including examples that have worked with similar medical concerns in the past so that she can maintain positive mood and activity level.  Interventions were effective, as evidenced by Meghan [name] formulating comprehensive plan that involved continuing to work on her book, using relaxation techniques such as breathing and visualization, preparing for party planning events, taking walks more frequently when the weather is nice, socializing with positive supports, challenging automatic negative thoughts, and attending church.  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  Clinician was supportive of plan and will continue to  monitor.                                        Plan: Follow up again in 1 week virtually.    Diagnosis: Major depressive disorder, recurrent, moderate; PTSD    [name], LCSW, LCAS 05/09/20

## 2020-05-11 ENCOUNTER — Other Ambulatory Visit: Payer: Self-pay

## 2020-05-11 ENCOUNTER — Encounter (HOSPITAL_COMMUNITY): Payer: Self-pay

## 2020-05-11 ENCOUNTER — Emergency Department (HOSPITAL_COMMUNITY)
Admission: EM | Admit: 2020-05-11 | Discharge: 2020-05-11 | Disposition: A | Discharge status: Home / Self Care | Payer: Medicare Other | Attending: Emergency Medicine | Admitting: Emergency Medicine

## 2020-05-11 ENCOUNTER — Emergency Department (HOSPITAL_COMMUNITY): Payer: Medicare Other

## 2020-05-11 DIAGNOSIS — E119 Type 2 diabetes mellitus without complications: Secondary | ICD-10-CM | POA: Diagnosis not present

## 2020-05-11 DIAGNOSIS — R0789 Other chest pain: Secondary | ICD-10-CM | POA: Diagnosis present

## 2020-05-11 DIAGNOSIS — Z7984 Long term (current) use of oral hypoglycemic drugs: Secondary | ICD-10-CM | POA: Diagnosis not present

## 2020-05-11 DIAGNOSIS — Z79899 Other long term (current) drug therapy: Secondary | ICD-10-CM | POA: Insufficient documentation

## 2020-05-11 DIAGNOSIS — M542 Cervicalgia: Secondary | ICD-10-CM | POA: Insufficient documentation

## 2020-05-11 DIAGNOSIS — I509 Heart failure, unspecified: Secondary | ICD-10-CM | POA: Insufficient documentation

## 2020-05-11 DIAGNOSIS — J45909 Unspecified asthma, uncomplicated: Secondary | ICD-10-CM | POA: Insufficient documentation

## 2020-05-11 DIAGNOSIS — Z20822 Contact with and (suspected) exposure to covid-19: Secondary | ICD-10-CM | POA: Insufficient documentation

## 2020-05-11 DIAGNOSIS — I4891 Unspecified atrial fibrillation: Secondary | ICD-10-CM | POA: Insufficient documentation

## 2020-05-11 DIAGNOSIS — M25519 Pain in unspecified shoulder: Secondary | ICD-10-CM | POA: Insufficient documentation

## 2020-05-11 DIAGNOSIS — R6884 Jaw pain: Secondary | ICD-10-CM | POA: Insufficient documentation

## 2020-05-11 LAB — BASIC METABOLIC PANEL
Anion gap: 10 (ref 5–15)
BUN: 13 mg/dL (ref 6–20)
CO2: 25 mmol/L (ref 22–32)
Calcium: 10.1 mg/dL (ref 8.9–10.3)
Chloride: 104 mmol/L (ref 98–111)
Creatinine, Ser: 0.84 mg/dL (ref 0.44–1.00)
GFR calc Af Amer: >60 mL/min (ref >60)
GFR calc non Af Amer: >60 mL/min (ref >60)
Glucose, Bld: 115 mg/dL — ABNORMAL HIGH (ref 70–99)
Potassium: 3.3 mmol/L — ABNORMAL LOW (ref 3.5–5.1)
Sodium: 139 mmol/L (ref 135–145)

## 2020-05-11 LAB — CBC
HCT: 34.9 % — ABNORMAL LOW (ref 36.0–46.0)
Hemoglobin: 10.8 g/dL — ABNORMAL LOW (ref 12.0–15.0)
MCH: 25.7 pg — ABNORMAL LOW (ref 26.0–34.0)
MCHC: 30.9 g/dL (ref 30.0–36.0)
MCV: 82.9 fL (ref 80.0–100.0)
Platelets: 328 10*3/uL (ref 150–400)
RBC: 4.21 MIL/uL (ref 3.87–5.11)
RDW: 15.3 % (ref 11.5–15.5)
WBC: 8.8 10*3/uL (ref 4.0–10.5)
nRBC: 0 % (ref 0.0–0.2)

## 2020-05-11 LAB — SARS CORONAVIRUS 2 BY RT PCR (HOSPITAL ORDER, PERFORMED IN ~~LOC~~ HOSPITAL LAB): SARS Coronavirus 2: NEGATIVE

## 2020-05-11 LAB — TROPONIN I (HIGH SENSITIVITY)
Troponin I (High Sensitivity): <2 ng/L (ref <18)
Troponin I (High Sensitivity): 6 ng/L (ref <18)

## 2020-05-11 MED ORDER — NITROGLYCERIN 0.4 MG SL SUBL
0.4000 mg | SUBLINGUAL_TABLET | Freq: Once | SUBLINGUAL | Status: AC
  Administered 2020-05-11: 0.4 mg via SUBLINGUAL
  Filled 2020-05-11: qty 1

## 2020-05-11 MED ORDER — HYDROCODONE-ACETAMINOPHEN 5-325 MG PO TABS
2.0000 | ORAL_TABLET | Freq: Once | ORAL | Status: AC
  Administered 2020-05-11: 2 via ORAL
  Filled 2020-05-11: qty 2

## 2020-05-11 MED ORDER — HYDROCODONE-ACETAMINOPHEN 5-325 MG PO TABS: 2.0000 | ORAL_TABLET | Freq: Four times a day (QID) | ORAL | 0 refills | Status: DC | PRN

## 2020-05-11 NOTE — ED Notes (Signed)
Patient verbalizes understanding of discharge instructions. Opportunity for questioning and answers were provided. Armband removed by staff, pt discharged from ED.  

## 2020-05-11 NOTE — ED Provider Notes (Signed)
[facility] EMERGENCY DEPARTMENT Provider Note   CSN: [id number] Arrival date & time: 05/11/20  1640     History Chief Complaint  Patient presents with  . Chest Pain    Meghan [name] is a 54 y.o. female.  The history is provided by the patient. No language interpreter was used.  Chest Pain Pain location:  L chest Pain quality: aching   Pain radiates to:  L jaw Pain severity:  Moderate Progression:  Unchanged Chronicity:  Recurrent Relieved by:  Nothing Worsened by:  Nothing Ineffective treatments:  None tried Risk factors: high cholesterol   Pt complains of pain in her chest, shoulder and neck.       Past Medical History:  Diagnosis Date  . Arthritis    left knee  . Asthma   . Atrial fibrillation ([place])   . CHF (congestive heart failure) ([employer]) 01/2019   Physician from [place] informed her she had this   . Dysrhythmia    Atrial fibrillation  . Headache   . Hiatal hernia   . History of pulmonary embolus (PE)   . Hypercholesterolemia   . Macular degeneration    of left eye  . Osteoarthritis    knees  . Pneumonia    in the past  . Pulmonary embolism ([place])    bilat  . Seizure ([place])   . Seizure disorder ([place]) 2017  . Sleep apnea CPAP   [facility]  . Stroke [facility])    1989 and 1995 (left sided weakness)  . Vitamin D deficiency     Patient Active Problem List   Diagnosis Date Noted  . Seizure-like activity ([place]) 03/25/2020  . History of head injury 03/25/2020  . Obstructive sleep apnea syndrome 09/04/2019  . Acute medial meniscus tear of right knee 07/10/2019  . Class 3 severe obesity due to excess calories with serious comorbidity and body mass index (BMI) of 50.0 to 59.9 in adult ([place]) 06/01/2019  . Atrial fibrillation ([place]) 04/17/2019  . History of pulmonary embolus (PE) 04/17/2019  . Type 2 diabetes mellitus, without long-term current use of insulin ([place]) 04/17/2019    Past Surgical History:  Procedure Laterality Date  . ABDOMINAL  HYSTERECTOMY  2003   complete  . CARDIAC CATHETERIZATION  2017   in [facility] in [place]  . CESAREAN SECTION  [id number]   x 2  . CESAREAN SECTION     x 2  . CHOLECYSTECTOMY  2003  . COLONOSCOPY WITH PROPOFOL N/A 06/05/2019   Procedure: COLONOSCOPY WITH PROPOFOL;  Surgeon: Carol Ada, MD;  Location: [facility] ENDOSCOPY;  Service: Endoscopy;  Laterality: N/A;  . HERNIA REPAIR  0093, 8182   umbilical hernia repair  . IVC FILTER INSERTION  2003   Hx pulmonary embolus  . POLYPECTOMY  06/05/2019   Procedure: POLYPECTOMY;  Surgeon: Carol Ada, MD;  Location: [facility] ENDOSCOPY;  Service: Endoscopy;;  . SHOULDER ARTHROSCOPY W/ ROTATOR CUFF REPAIR  08/28/2017   right shoulder     OB History   No obstetric history on file.     Family History  Problem Relation Age of Onset  . Breast cancer Mother   . Breast cancer Maternal Grandmother     Social History   Tobacco Use  . Smoking status: Never Smoker  . Smokeless tobacco: Never Used  Vaping Use  . Vaping Use: Never used  Substance Use Topics  . Alcohol use: Never  . Drug use: Never    Home Medications Prior to Admission medications  Medication Sig Start Date End Date Taking? Authorizing Provider  acetaminophen (TYLENOL 8 HOUR) 650 MG CR tablet Take 1 tablet (650 mg total) by mouth every 8 (eight) hours as needed for pain. Patient taking differently: Take 1,300 mg by mouth every 8 (eight) hours as needed for pain.  02/02/20  Yes Aberman, Druscilla Brownie, PA-C  albuterol (VENTOLIN HFA) 108 (90 Base) MCG/ACT inhaler Inhale 2 puffs into the lungs every 6 (six) hours as needed for wheezing or shortness of breath.   Yes [provider]  BREZTRI AEROSPHERE 160-9-4.8 MCG/ACT AERO Inhale 2 puffs into the lungs in the morning and at bedtime.  03/24/20  Yes [provider]  Cholecalciferol (VITAMIN D-3) 25 MCG (1000 UT) CAPS Take 1,000 Units by mouth daily.   Yes [provider]  cycloSPORINE (RESTASIS) 0.05 %  ophthalmic emulsion Place 1 drop into both eyes 2 (two) times daily.    Yes [provider]  flecainide (TAMBOCOR) 50 MG tablet Take 50 mg by mouth 2 (two) times daily.  09/04/19  Yes [provider]  furosemide (LASIX) 40 MG tablet Take 40 mg by mouth daily with breakfast.    Yes [provider]  metFORMIN (GLUCOPHAGE) 500 MG tablet Take 500 mg by mouth 2 (two) times daily with a meal.    Yes [provider]  Omega-3 Fatty Acids (FISH OIL) 1000 MG CAPS Take 1,000 mg by mouth daily with breakfast.    Yes [provider]  ondansetron (ZOFRAN ODT) 4 MG disintegrating tablet Take 1 tablet (4 mg total) by mouth every 8 (eight) hours as needed for nausea or vomiting. Patient taking differently: Take 4 mg by mouth every 8 (eight) hours as needed for nausea or vomiting (DISSOLVE ORALLY).  02/02/20  Yes Aberman, Caroline C, PA-C  OZEMPIC, 0.25 OR 0.5 MG/DOSE, 2 MG/1.5ML SOPN Inject 1 mg into the skin every Tuesday.  12/18/19  Yes [provider]  rivaroxaban (XARELTO) 20 MG TABS tablet Take 20 mg by mouth at bedtime.    Yes [provider]  tiZANidine (ZANAFLEX) 4 MG tablet Take 4 mg by mouth at bedtime as needed for muscle spasms.    Yes [provider]  diltiazem (CARDIZEM CD) 120 MG 24 hr capsule Take 240 mg by mouth daily. 12/28/19   [provider]  diltiazem (TIAZAC) 120 MG 24 hr capsule Take 240 mg by mouth daily. Patient not taking: Reported on 05/11/2020 04/04/20   [provider]  Multiple Vitamins-Minerals (PRESERVISION AREDS 2 PO) Take 2 capsules by mouth daily with breakfast.  Patient not taking: Reported on 05/11/2020    [provider]  naproxen (NAPROSYN) 500 MG tablet Take 1 tablet (500 mg total) by mouth 2 (two) times daily. Patient not taking: Reported on 05/11/2020 02/18/20   Joy, Helane Gunther, PA-C  Vitamin D, Ergocalciferol, (DRISDOL) 1.25 MG (50000 UNIT) CAPS capsule Take 50,000 Units by mouth once a  week. Patient not taking: Reported on 05/11/2020 02/02/20   [provider]    Allergies    Other, Acetaminophen-codeine, Coconut oil, Meloxicam, and Tomato  Review of Systems   Review of Systems  Cardiovascular: Positive for chest pain.  All other systems reviewed and are negative.   Physical Exam Updated Vital Signs BP (!) 116/40   Pulse 68   Temp 98.3 F (36.8 C) (Oral)   Resp (!) 21   Ht 5\' 5"  (1.651 m)   Wt (!) 139.7 kg   SpO2 98%   BMI  51.25 kg/m   Physical Exam Vitals and nursing note reviewed.  Constitutional:      Appearance: She is well-developed.  HENT:     Head: Normocephalic.  Cardiovascular:     Rate and Rhythm: Normal rate and regular rhythm.     Heart sounds: Normal heart sounds.  Pulmonary:     Effort: Pulmonary effort is normal.  Abdominal:     General: Bowel sounds are normal. There is no distension.     Palpations: Abdomen is soft.  Musculoskeletal:        General: Normal range of motion.     Cervical back: Normal range of motion.  Skin:    General: Skin is warm.  Neurological:     General: No focal deficit present.     Mental Status: She is alert and oriented to person, place, and time.     ED Results / Procedures / Treatments   Labs (all labs ordered are listed, but only abnormal results are displayed) Labs Reviewed  BASIC METABOLIC PANEL - Abnormal; Notable for the following components:      Result Value   Potassium 3.3 (*)    Glucose, Bld 115 (*)    All other components within normal limits  CBC - Abnormal; Notable for the following components:   Hemoglobin 10.8 (*)    HCT 34.9 (*)    MCH 25.7 (*)    All other components within normal limits  SARS CORONAVIRUS 2 BY RT PCR (HOSPITAL ORDER, [facility])  TROPONIN I (HIGH SENSITIVITY)  TROPONIN I (HIGH SENSITIVITY)    EKG EKG Interpretation  Date/Time:  Wednesday May 11 2020 16:41:31 EDT Ventricular Rate:  75 PR Interval:    QRS  Duration: 82 QT Interval:  376 QTC Calculation: 420 R Axis:   56 Text Interpretation: Sinus rhythm Borderline T abnormalities, anterior leads No significant change since last tracing Confirmed by Calvert Cantor [phone]) on 05/11/2020 4:43:12 PM   Radiology DG Chest 2 View  Result Date: 05/11/2020 CLINICAL DATA:  Chest pain on the left EXAM: CHEST - 2 VIEW COMPARISON:  04/04/2020 FINDINGS: Cardiac shadow is enlarged but stable. The lungs are well aerated bilaterally. Central vascular congestion is seen but stable. No focal infiltrate or sizable effusion is seen. No edema is noted. IMPRESSION: Mild central vascular congestion without pulmonary edema. Electronically Signed   By: Inez Catalina M.D.   On: 05/11/2020 18:00    Procedures Procedures (including critical care time)  Medications Ordered in ED Medications  nitroGLYCERIN (NITROSTAT) SL tablet 0.4 mg (0.4 mg Sublingual Given 05/11/20 2007)  HYDROcodone-acetaminophen (NORCO/VICODIN) 5-325 MG per tablet 2 tablet (2 tablets Oral Given 05/11/20 2012)    ED Course  I have reviewed the triage vital signs and the nursing notes.  Pertinent labs & imaging results that were available during my care of the patient were reviewed by me and considered in my medical decision making (see chart for details).    MDM Rules/Calculators/A&P                          MDM:  covid is negative, troponin is negative x 2.  No acute ekg changes.  Pt reports no relief from nitro.  Pt reports pain is better with hydrocodone.  Final Clinical Impression(s) / ED Diagnoses Final diagnoses:  Chest wall pain    Rx / DC Orders ED Discharge Orders         Ordered  HYDROcodone-acetaminophen (NORCO/VICODIN) 5-325 MG tablet  Every 6 hours PRN        05/11/20 2239           [name] Ace 05/11/20 2239    Truddie Hidden, MD 05/11/20 [phone]

## 2020-05-11 NOTE — Discharge Instructions (Addendum)
Return if any problems. Schedule appointment to see your Physician for recheck in 2-3 days

## 2020-05-11 NOTE — ED Triage Notes (Signed)
Pt brought in by EMS from home for left sided CP that radiates to the left neck and left shoulder. Per EMS pt had a syncopal episode on arriving to the ED that last for approx 1 min. Pt A+Ox4 on arrival to room. Pt endorses hx of PE and filter placement in 2003. Pt endorses hx of afib.

## 2020-05-16 ENCOUNTER — Other Ambulatory Visit: Payer: Self-pay

## 2020-05-16 ENCOUNTER — Ambulatory Visit (INDEPENDENT_AMBULATORY_CARE_PROVIDER_SITE_OTHER): Payer: Medicare Other | Admitting: Licensed Clinical Social Worker

## 2020-05-16 DIAGNOSIS — F331 Major depressive disorder, recurrent, moderate: Secondary | ICD-10-CM

## 2020-05-16 DIAGNOSIS — F4310 Post-traumatic stress disorder, unspecified: Secondary | ICD-10-CM | POA: Diagnosis not present

## 2020-05-16 NOTE — Progress Notes (Signed)
Virtual Visit via Video Note   I connected with Meghan [name] on 05/16/20 at 1:00pm by video enabled telemedicine application and verified that I am speaking with the correct person using two identifiers.   I discussed the limitations, risks, security and privacy concerns of performing an evaluation and management service by telephone and the availability of in person appointments. I also discussed with the patient that there may be a patient responsible charge related to this service. The patient expressed understanding and agreed to proceed.   I discussed the assessment and treatment plan with the patient. The patient was provided an opportunity to ask questions and all were answered. The patient agreed with the plan and demonstrated an understanding of the instructions.   The patient was advised to call back or seek an in-person evaluation if the symptoms worsen or if the condition fails to improve as anticipated.   I provided 1 hour of non-face-to-face time during this encounter.     Cory Bates, LCSW, LCAS _______________________ THERAPIST PROGRESS NOTE   Session Time: 1:00pm - 2:00pm  Location: Patient: Patient Home Provider: [facility]    Participation Level: Active    Behavioral Response: Alert, casually dressed, depressed mood/affect   Type of Therapy:  Individual Therapy   Treatment Goals addressed: Anxiety/depression management; medication management    Interventions: CBT, problem solving   Summary: Meghan [name] is a 54 year old African American female that presented for virtual appointment today with diagnoses of Major Depressive Disorder, recurrent, moderate, and PTSD.      Suicidal/Homicidal: None; without plan or intent    Therapist Response:  Clinician met with Meghan [name] for virtual session and assessed for safety, sobriety, and medication compliance.  [name] presented for session on time and was alert, oriented x5, with no evidence or self-report of SI/HI or A/V H.  [name]  reported that she continues taking medication as prescribed and denied any use of alcohol or illicit substances.  Clinician inquired about Meghan [name]'s emotional ratings today, as well as any significant changes in thoughts, feelings, or behavior since last check-in.  [name] reported scores of 10/10 for depression and 5/10 for anxiety, stating "I'm in my feelings today".  Meghan [name] reported that she has been feeling more bored recently and second guessing her decision months ago to move to [place] from [place] due to decreased sense of connection between her family and close friends.  Scarlet acknowledged some ambivalence about the idea of returning home, stating "I really miss it, but whenever I visit there, I can't wait to come back".  Clinician assisted Meghan [name] in weighing pros and cons of moving back to [place] to help her think logically about this decision and avoid impulsive choices influenced by emotion.  Meghan [name] participated in this process and noted that although it would help her stay closer to her best friend, there were far more negatives to consider, including chance of unhealthy boundaries with family, worsened mood, lack of contact with sister who lives here, loss of religious community, and more.  Meghan [name] reported that she feels that she is lonelier lately and needs to explore more opportunities to meet new people around the area, so clinician discussed solutions to address this, including outlets such as connecting with interest groups on social media, volunteer opportunities, book clubs, fitness groups, church social events, local festivals, and more.  Interventions were effective, as evidenced by Meghan [name] expressing interest in several of these to increase social connections, including volunteer work, book clubs, and [employer] fitness classes, stating "  I do feel a lot better, and this reminded me of some upcoming things to look forward to which will get me out of the house too".  Clinician will continue to monitor.                                         Plan: Follow up again in 1 week virtually.    Diagnosis: Major depressive disorder, recurrent, moderate; PTSD    Cory  Bates, LCSW, LCAS 05/16/20 

## 2020-05-26 ENCOUNTER — Ambulatory Visit: Payer: Medicare Other | Admitting: Neurology

## 2020-05-30 ENCOUNTER — Other Ambulatory Visit: Payer: Self-pay

## 2020-05-30 ENCOUNTER — Ambulatory Visit (INDEPENDENT_AMBULATORY_CARE_PROVIDER_SITE_OTHER): Payer: Medicare Other | Admitting: Licensed Clinical Social Worker

## 2020-05-30 DIAGNOSIS — F331 Major depressive disorder, recurrent, moderate: Secondary | ICD-10-CM | POA: Diagnosis not present

## 2020-05-30 DIAGNOSIS — F4310 Post-traumatic stress disorder, unspecified: Secondary | ICD-10-CM | POA: Diagnosis not present

## 2020-05-30 NOTE — Progress Notes (Signed)
Virtual Visit via Video Note   I connected with Meghan [name] on 05/30/20 at 3:00pm by video enabled telemedicine application and verified that I am speaking with the correct person using two identifiers.   I discussed the limitations, risks, security and privacy concerns of performing an evaluation and management service by telephone and the availability of in person appointments. I also discussed with the patient that there may be a patient responsible charge related to this service. The patient expressed understanding and agreed to proceed.   I discussed the assessment and treatment plan with the patient. The patient was provided an opportunity to ask questions and all were answered. The patient agreed with the plan and demonstrated an understanding of the instructions.   The patient was advised to call back or seek an in-person evaluation if the symptoms worsen or if the condition fails to improve as anticipated.   I provided 1 hour of non-face-to-face time during this encounter.     [name], LCSW, LCAS _______________________ THERAPIST PROGRESS NOTE   Session Time: 3:00pm - 4:00pm  Location: Patient: Patient Home Provider: [place] Office    Participation Level: Active    Behavioral Response: Alert, casually dressed, euthymic mood/affect   Type of Therapy:  Individual Therapy   Treatment Goals addressed: Anxiety/depression management; medication management; Healthy family boundaries    Interventions: CBT, personal boundaries   Summary: Meghan [name] is a 54 year old African American female that presented for virtual appointment today with diagnoses of Major Depressive Disorder, recurrent, moderate, and PTSD.      Suicidal/Homicidal: None; without plan or intent    Therapist Response:  Clinician met with [name] for virtual appointment and assessed for safety, sobriety, and medication compliance.  [name] presented for appointment on time and was alert, oriented x5, with no evidence or  self-report of SI/HI or A/V H.  Chanel reported ongoing compliance with medication and denied use of alcohol or illicit substances.  Clinician inquired about [name]'s current emotional ratings, as well as any significant changes in thoughts, feelings, or behavior since previous check-in.  [name] reported scores of 0/10 for both depression and anxiety, denying any reoccurrence of panic episodes for several weeks now.  Clinician inquired about [name]'s recent successes and struggles in past week which could be influencing mood improvement.  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  [name] reported that she spoke with her publisher and decided to add in new material which would help provide an even more in depth description of her past, stating "There was some stuff I was leaving out, but I need to put it in there if I want to move on with my life".  Clinician inquired about whether there has been any negative impact on her mood from revisiting the past, particularly in regard to previous trauma and/or related symptoms.  [name] denied this, but acknowledged that she would stay close to supports to process anything troubling that came up unresolved as she begins writing regularly again.  [name] reported that a major success was reinforcement of healthier boundaries which took place last weekend when her two daughters had a disagreement over something, and outreached her to settle it as they have done in the past on numerous occasions.  [name] reported that she was assertive and told them that  although she loved them both, she expected them to settle these issues themselves now that they are adults, which they were able to do, and this reduced her need to take on additional family related  stress.  [name] reported that her own sister overheard this interaction and praised her for approaching the situation differently.  [name] ended up getting a phone call during session regarding a family member in another state being exposed to [place], and clinician processed this news with her afterward, including any automatic negative thoughts which were present, and substitution of more realistic, positive thoughts to counter potential impact on mood.  Interventions were effective, as evidenced by [name] acknowledging that although she initially worried that something bad had happened to family as she has anticipated in the past, she avoided catastrophizing, and discovered that everything turned out to be okay by the end of their conversation, stating "I have to remember not to react, and just wait until I have all the facts".  Clinician will continue to monitor.                                          Plan: Follow up again in 1 week virtually.    Diagnosis: Major depressive disorder, recurrent, moderate; PTSD    [name], LCSW, LCAS 05/30/20

## 2020-06-06 ENCOUNTER — Other Ambulatory Visit: Payer: Self-pay

## 2020-06-06 ENCOUNTER — Ambulatory Visit (INDEPENDENT_AMBULATORY_CARE_PROVIDER_SITE_OTHER): Payer: Medicare Other | Admitting: Licensed Clinical Social Worker

## 2020-06-06 DIAGNOSIS — F4310 Post-traumatic stress disorder, unspecified: Secondary | ICD-10-CM

## 2020-06-06 DIAGNOSIS — F331 Major depressive disorder, recurrent, moderate: Secondary | ICD-10-CM | POA: Diagnosis not present

## 2020-06-06 NOTE — Progress Notes (Signed)
Virtual Visit via Video Note   I connected with Meghan [name] on 06/06/20 at 3:00pm by video enabled telemedicine application and verified that I am speaking with the correct person using two identifiers.   I discussed the limitations, risks, security and privacy concerns of performing an evaluation and management service by telephone and the availability of in person appointments. I also discussed with the patient that there may be a patient responsible charge related to this service. The patient expressed understanding and agreed to proceed.   I discussed the assessment and treatment plan with the patient. The patient was provided an opportunity to ask questions and all were answered. The patient agreed with the plan and demonstrated an understanding of the instructions.   The patient was advised to call back or seek an in-person evaluation if the symptoms worsen or if the condition fails to improve as anticipated.   I provided 45 minutes of non-face-to-face time during this encounter.     [name], LCSW, LCAS _______________________ THERAPIST PROGRESS NOTE   Session Time: 3:00pm - 3:45pm   Location: Patient: Patient Home Provider: [place] Office    Participation Level: Active    Behavioral Response: Alert, casually dressed, euthymic mood/affect   Type of Therapy:  Individual Therapy   Treatment Goals addressed: Anxiety/depression management; medication management; Medical Appointments; [place] attendance and related community activities    Interventions: CBT, personal boundaries and assertive communication   Summary: Meghan [name] is a 54 year old African American female that presented for virtual appointment today with diagnoses of Major Depressive Disorder, recurrent, moderate, and PTSD.      Suicidal/Homicidal: None; without plan or intent    Therapist Response:  Clinician met with Meghan [name] for virtual session and assessed for safety, sobriety, and medication compliance.  Meghan [name]  presented for session on time and was alert, oriented x5, with no evidence or self-report of SI/HI or A/V H.  Meghan [name] reported that she continues taking medication as prescribed and noted that she is now receiving 3 additional antibiotics due to contracting H. Pylori recently.  Meghan [name] reported that she has been advised to continue taking these until her surgery on October 28th, and will wash hands often, stay hydrated, and avoid spicy or salty foods that might upset her stomach.  Meghan [name] denied any use of alcohol or illicit substances.  Clinician inquired about Meghan [name]'s emotional ratings today, as well as any significant changes in thoughts, feelings, or behavior since last check-in.  Meghan [name] reported scores of 1/10 for depression and 0/10 for anxiety, but disclosed that she did have a panic attack on Saturday after learning of another church member's negative social media post about her.  Clinician processed this recent challenge with Meghan [name], including events leading up to attack, and how she attempted to cope.  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  Meghan [name] reported that she ended up confronting this woman the next day at church and received an apology, but still intends to leave the dance ministry due to ongoing drama impacting mental health.  Meghan [name] reported that she will do this on Friday evening via a Zoom call with 2 members, but has not decided how she will approach this conversation yet.  Clinician assisted Meghan [name] today in creating an agenda ahead of this zoom meeting so that she can properly organize her thoughts, reduce overall stress related to speaking up for herself, and establish healthier boundaries with 'toxic' members  of church community that do not have her best interests in mind.  Interventions were effective, as evidenced by Meghan [name] reporting that she had increased confidence in advocating for herself following this discussion, and stating "This reminded me that I can do something if I put my mind to it.  I've become more assertive and it feels good to speak up for myself now".  Meghan [name] also reported that she would speak to the pastor afterward about having separate opportunities from the ministry events to dance for the church since she believes this can still be a rewarding self-care activity if it doesn't involve interference from aforementioned members of the church. Clinician will continue to monitor.                                          Plan: Follow up again in 1 week virtually.    Diagnosis: Major depressive disorder, recurrent, moderate; PTSD    [name], LCSW, LCAS 06/06/20

## 2020-06-13 ENCOUNTER — Other Ambulatory Visit: Payer: Self-pay

## 2020-06-13 ENCOUNTER — Ambulatory Visit (INDEPENDENT_AMBULATORY_CARE_PROVIDER_SITE_OTHER): Payer: Medicare Other | Admitting: Licensed Clinical Social Worker

## 2020-06-13 DIAGNOSIS — F331 Major depressive disorder, recurrent, moderate: Secondary | ICD-10-CM | POA: Diagnosis not present

## 2020-06-13 DIAGNOSIS — F4310 Post-traumatic stress disorder, unspecified: Secondary | ICD-10-CM

## 2020-06-13 NOTE — Progress Notes (Signed)
Virtual Visit via Video Note   I connected with Meghan [name] on 06/13/20 at 3:00pm by video enabled telemedicine application and verified that I am speaking with the correct person using two identifiers.   I discussed the limitations, risks, security and privacy concerns of performing an evaluation and management service by video and the availability of in person appointments. I also discussed with the patient that there may be a patient responsible charge related to this service. The patient expressed understanding and agreed to proceed.   I discussed the assessment and treatment plan with the patient. The patient was provided an opportunity to ask questions and all were answered. The patient agreed with the plan and demonstrated an understanding of the instructions.   The patient was advised to call back or seek an in-person evaluation if the symptoms worsen or if the condition fails to improve as anticipated.   I provided 1 hour of non-face-to-face time during this encounter.     [name], LCSW, LCAS _______________________ THERAPIST PROGRESS NOTE   Session Time: 3:00pm - 4:00pm  Location: Patient: Patient Home Provider: [place] [facility]    Participation Level: Active    Behavioral Response: Alert, casually dressed, anxious mood/affect   Type of Therapy:  Individual Therapy   Treatment Goals addressed: Anxiety/depression management; medication management; [place] attendance and related community activities   Interventions: CBT, assertive communication and boundaries   Summary: Meghan [name] is a 54 year old African American female that presented for virtual appointment today with diagnoses of Major Depressive Disorder, recurrent, moderate, and PTSD.      Suicidal/Homicidal: None; without plan or intent    Therapist Response:  Clinician met with Meghan [name] for virtual appointment and assessed for safety, sobriety, and medication compliance.  Meghan [name] presented for appointment on time and was  alert, oriented x5, with no evidence or self-report of SI/HI or A/V H.  Earl reported ongoing compliance with medication and denied any use of alcohol or illicit substances.  Clinician inquired about Meghan [name]'s current emotional ratings, as well as any significant changes in thoughts, feelings, or behavior since previous check-in.  [name] reported scores of 0/10 for depression and 10/10 for anxiety, stating "I did have one panic attack on Sunday, but it wasn't too bad".  Clinician inquired about recent stressors Meghan [name] has been dealing with that contributed to this attack, as well as how she attempted to de-escalate.  Meghan [name] reported that a member of the dance ministry has continued to postpone meeting to discuss ongoing issues and when she expected to speak with them on Sunday, she began experiencing panic symptoms, and had to be taken aside to the bathroom by a friend, who coached her through breathing process to calm down before returning to service.  Meghan [name] reported that she is becoming more irritated by this individual's pattern of avoidance, but they have rescheduled to speak virtually on Friday, which she is now dreading herself.  Clinician empathized with [name] and revisited agenda of topics she wishes to cover ahead of the meeting so that she will feel more prepared, capable of properly expressing herself and reduce related anxieties. Intervention was effective, as evidenced by [name] reporting that it made her feel more at ease discussing upcoming plan in session today, and stating "I'm looking forward to it now.  I want this off my chest so I can go into service and not worry anymore".  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  Clinician will continue to monitor.                                           Plan: Follow up again in 1 week virtually.    Diagnosis: Major depressive disorder, recurrent, moderate; PTSD    [name], LCSW, LCAS 06/13/20

## 2020-06-20 ENCOUNTER — Ambulatory Visit (INDEPENDENT_AMBULATORY_CARE_PROVIDER_SITE_OTHER): Payer: Medicare Other | Admitting: Licensed Clinical Social Worker

## 2020-06-20 ENCOUNTER — Other Ambulatory Visit: Payer: Self-pay

## 2020-06-20 DIAGNOSIS — F331 Major depressive disorder, recurrent, moderate: Secondary | ICD-10-CM | POA: Diagnosis not present

## 2020-06-20 DIAGNOSIS — F4310 Post-traumatic stress disorder, unspecified: Secondary | ICD-10-CM | POA: Diagnosis not present

## 2020-06-20 NOTE — Progress Notes (Signed)
Virtual Visit via Video Note   I connected with Meghan [name] on 06/20/20 at 3:00pm by video enabled telemedicine application and verified that I am speaking with the correct person using two identifiers.   I discussed the limitations, risks, security and privacy concerns of performing an evaluation and management service by video and the availability of in person appointments. I also discussed with the patient that there may be a patient responsible charge related to this service. The patient expressed understanding and agreed to proceed.   I discussed the assessment and treatment plan with the patient. The patient was provided an opportunity to ask questions and all were answered. The patient agreed with the plan and demonstrated an understanding of the instructions.   The patient was advised to call back or seek an in-person evaluation if the symptoms worsen or if the condition fails to improve as anticipated.   I provided 1 hour of non-face-to-face time during this encounter.     [name], LCSW, LCAS _______________________ THERAPIST PROGRESS NOTE   Session Time: 3:00pm - 4:00pm  Location: Patient: Patient Home Provider: [place] Office    Participation Level: Active    Behavioral Response: Alert, casually dressed, irritable mood/affect   Type of Therapy:  Individual Therapy   Treatment Goals addressed: Anxiety/depression management; medication management; Meghan [name] attendance and related community activities   Interventions: CBT, solution focused, treatment planning    Summary: Meghan [name] is a 54 year old African American female that presented for virtual appointment today with diagnoses of Major Depressive Disorder, recurrent, moderate, and PTSD.      Suicidal/Homicidal: None; without plan or intent    Therapist Response:  Clinician met with Meghan [name] for virtual session and assessed for safety, sobriety, and medication compliance.  Meghan [name] presented for session on time and was alert,  oriented x5, with no evidence or self-report of SI/HI or A/V H.  Meghan [name] reported that she continues taking medication as prescribed and denied any use of alcohol or illicit substances.  Clinician inquired about Meghan [name]'s emotional ratings today, as well as any significant changes in thoughts, feelings, or behavior since last check-in.  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  Meghan [name] reported that she attended a zoom meeting last Friday with the two other members to resolve an ongoing dispute, but she was dismissed before she could truly speak her mind, and on Saturday when she was not invited to practice at church she went outside and cried, and when people began checking on her she experienced a panic attack.  Clinician empathized with Meghan [name] regarding this challenge, and explored alternative strategies for dealing with unsupportive members of the church community without shutting herself off from community activities which provide healthy outlet.  Meghan [name] acknowledged that she is done communicating with this person, and has instead spoken with the pastor about these issues, who will allow her to dance for special events without need for the ministry members' involvement or approval, including one she plans to practice for tomorrow.  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  Clinician recommended that Meghan [name] continued engaging in healthy activities for stress  relief to alleviate anger, such as taking walks, writing, and processing challenges with supportive family members rather than trying to compartmentalize.  Clinician also  revisited treatment plan with Meghan [name] today due to length of time that has passed, and collaborated with her to make following revisions as needed to person centered goals: Meet with clinician once per week for virtual therapy sessions to update on goal progress and address any needs that arise; Maintain depression at average daily severity of 0/10-1/10 for the next 90 days by attending weekly therapy sessions, and engaging in healthy self-care activities daily to keep mind engaged such as reading, going for walks in the park, and speaking with family x3 weekly for support; Reduce average daily anxiety level from severity of 2/10 down to a 1/10 in next 90 days by practicing relaxation techniques with proven efficacy 2-3x daily, in addition to challenging anxious thoughts that arise to negate negative impact on outlook;  Exercise for at least 30 minutes of cardio daily, in addition to following heart-healthy diet to improve both physical and mental well-being per PCP recommendations; Attend appointments with cardiologist PRN and PCP MD x1 per month to address current and any future physical health needs that may arise during treatment; Maintain healthy sleep routine throughout week to ensure average of 8-9 hours rest nightly to reduce irritability, increase focus, and maintain daily motivation;  Attend church meetings with clergy members weekly on Monday, Tuesday and Wednesday to stay productive, engaged with supportive community, and maintain spiritual support, in addition to any other available activities (volunteering at soup kitchen) that can be added into schedule without leading to imbalance; Maintain healthier boundaries with all supports to avoid worsening anxiety and depression, as well as enforce assertive communication skills, with goal of limiting contact with toxic supports until more respectful communication patterns are established; Commit to at least 2 hours of daily writing to stay on track with  completion of book by end of December 2021 and achieve creative emotional outlet/release from difficult events in past;  Voluntarily seek admission to hospital with assistance from family members or medical professionals if Meghan [name] begins to experience SI/HI or A/V H and safety is determined to be in danger.  Meghan [name] reported that she would follow up in 1 week.  Clinician will continue to monitor.                                          Plan: Follow up again in 1 week virtually.    Diagnosis: Major depressive disorder, recurrent, moderate; PTSD    [name], LCSW, LCAS 06/20/20

## 2020-06-22 ENCOUNTER — Encounter (HOSPITAL_COMMUNITY): Payer: Self-pay

## 2020-06-22 ENCOUNTER — Emergency Department (HOSPITAL_COMMUNITY): Payer: Medicare Other

## 2020-06-22 ENCOUNTER — Emergency Department (HOSPITAL_COMMUNITY)
Admission: EM | Admit: 2020-06-22 | Discharge: 2020-06-22 | Disposition: A | Discharge status: Home / Self Care | Payer: Medicare Other | Attending: Emergency Medicine | Admitting: Emergency Medicine

## 2020-06-22 ENCOUNTER — Other Ambulatory Visit: Payer: Self-pay

## 2020-06-22 DIAGNOSIS — Z7984 Long term (current) use of oral hypoglycemic drugs: Secondary | ICD-10-CM | POA: Insufficient documentation

## 2020-06-22 DIAGNOSIS — Z20822 Contact with and (suspected) exposure to covid-19: Secondary | ICD-10-CM | POA: Insufficient documentation

## 2020-06-22 DIAGNOSIS — N390 Urinary tract infection, site not specified: Secondary | ICD-10-CM

## 2020-06-22 DIAGNOSIS — Z79899 Other long term (current) drug therapy: Secondary | ICD-10-CM | POA: Diagnosis not present

## 2020-06-22 DIAGNOSIS — R591 Generalized enlarged lymph nodes: Secondary | ICD-10-CM

## 2020-06-22 DIAGNOSIS — J45909 Unspecified asthma, uncomplicated: Secondary | ICD-10-CM | POA: Insufficient documentation

## 2020-06-22 DIAGNOSIS — R1031 Right lower quadrant pain: Secondary | ICD-10-CM | POA: Insufficient documentation

## 2020-06-22 DIAGNOSIS — R1011 Right upper quadrant pain: Secondary | ICD-10-CM | POA: Diagnosis not present

## 2020-06-22 DIAGNOSIS — I509 Heart failure, unspecified: Secondary | ICD-10-CM | POA: Insufficient documentation

## 2020-06-22 DIAGNOSIS — E119 Type 2 diabetes mellitus without complications: Secondary | ICD-10-CM | POA: Diagnosis not present

## 2020-06-22 LAB — URINALYSIS, ROUTINE W REFLEX MICROSCOPIC
Bilirubin Urine: NEGATIVE
Glucose, UA: NEGATIVE mg/dL
Ketones, ur: 5 mg/dL — AB
Leukocytes,Ua: NEGATIVE
Nitrite: NEGATIVE
Protein, ur: NEGATIVE mg/dL
Specific Gravity, Urine: 1.027 (ref 1.005–1.030)
pH: 5 (ref 5.0–8.0)

## 2020-06-22 LAB — CBC WITH DIFFERENTIAL/PLATELET
Abs Immature Granulocytes: 0.01 10*3/uL (ref 0.00–0.07)
Basophils Absolute: 0 10*3/uL (ref 0.0–0.1)
Basophils Relative: 0 %
Eosinophils Absolute: 0.1 10*3/uL (ref 0.0–0.5)
Eosinophils Relative: 2 %
HCT: 34.2 % — ABNORMAL LOW (ref 36.0–46.0)
Hemoglobin: 11.1 g/dL — ABNORMAL LOW (ref 12.0–15.0)
Immature Granulocytes: 0 %
Lymphocytes Relative: 28 %
Lymphs Abs: 2 10*3/uL (ref 0.7–4.0)
MCH: 27.1 pg (ref 26.0–34.0)
MCHC: 32.5 g/dL (ref 30.0–36.0)
MCV: 83.6 fL (ref 80.0–100.0)
Monocytes Absolute: 0.6 10*3/uL (ref 0.1–1.0)
Monocytes Relative: 9 %
Neutro Abs: 4.4 10*3/uL (ref 1.7–7.7)
Neutrophils Relative %: 61 %
Platelets: 254 10*3/uL (ref 150–400)
RBC: 4.09 MIL/uL (ref 3.87–5.11)
RDW: 15.3 % (ref 11.5–15.5)
WBC: 7.2 10*3/uL (ref 4.0–10.5)
nRBC: 0 % (ref 0.0–0.2)

## 2020-06-22 LAB — COMPREHENSIVE METABOLIC PANEL
ALT: 13 U/L (ref 0–44)
AST: 14 U/L — ABNORMAL LOW (ref 15–41)
Albumin: 3.5 g/dL (ref 3.5–5.0)
Alkaline Phosphatase: 186 U/L — ABNORMAL HIGH (ref 38–126)
Anion gap: 9 (ref 5–15)
BUN: 19 mg/dL (ref 6–20)
CO2: 29 mmol/L (ref 22–32)
Calcium: 10.4 mg/dL — ABNORMAL HIGH (ref 8.9–10.3)
Chloride: 103 mmol/L (ref 98–111)
Creatinine, Ser: 0.91 mg/dL (ref 0.44–1.00)
GFR calc non Af Amer: >60 mL/min (ref >60)
Glucose, Bld: 94 mg/dL (ref 70–99)
Potassium: 3.7 mmol/L (ref 3.5–5.1)
Sodium: 141 mmol/L (ref 135–145)
Total Bilirubin: 0.3 mg/dL (ref 0.3–1.2)
Total Protein: 7.9 g/dL (ref 6.5–8.1)

## 2020-06-22 LAB — RESPIRATORY PANEL BY RT PCR (FLU A&B, COVID)
Influenza A by PCR: NEGATIVE
Influenza B by PCR: NEGATIVE
SARS Coronavirus 2 by RT PCR: NEGATIVE

## 2020-06-22 LAB — LIPASE, BLOOD: Lipase: 35 U/L (ref 11–51)

## 2020-06-22 MED ORDER — IOHEXOL 300 MG/ML  SOLN
100.0000 mL | Freq: Once | INTRAMUSCULAR | Status: AC | PRN
  Administered 2020-06-22: 100 mL via INTRAVENOUS

## 2020-06-22 MED ORDER — CEPHALEXIN 500 MG PO CAPS
500.0000 mg | ORAL_CAPSULE | Freq: Three times a day (TID) | ORAL | 0 refills | Status: DC
Start: 2020-06-22 — End: 2020-09-30

## 2020-06-22 MED ORDER — SODIUM CHLORIDE 0.9 % IV SOLN: 1.0000 g | Freq: Once | INTRAVENOUS | Status: DC

## 2020-06-22 MED ORDER — HYDROCODONE-ACETAMINOPHEN 5-325 MG PO TABS: 1.0000 | ORAL_TABLET | ORAL | 0 refills | Status: DC | PRN

## 2020-06-22 MED ORDER — CEPHALEXIN 500 MG PO CAPS
500.0000 mg | ORAL_CAPSULE | Freq: Once | ORAL | Status: AC
  Administered 2020-06-22: 500 mg via ORAL
  Filled 2020-06-22: qty 1

## 2020-06-22 MED ORDER — ONDANSETRON HCL 4 MG/2ML IJ SOLN
4.0000 mg | Freq: Once | INTRAMUSCULAR | Status: AC
  Administered 2020-06-22: 4 mg via INTRAVENOUS
  Filled 2020-06-22: qty 2

## 2020-06-22 MED ORDER — SODIUM CHLORIDE 0.9 % IV BOLUS: 500.0000 mL | Freq: Once | INTRAVENOUS | Status: DC

## 2020-06-22 MED ORDER — HYDROMORPHONE HCL 1 MG/ML IJ SOLN
1.0000 mg | Freq: Once | INTRAMUSCULAR | Status: AC
  Administered 2020-06-22: 1 mg via INTRAVENOUS
  Filled 2020-06-22: qty 1

## 2020-06-22 MED ORDER — SODIUM CHLORIDE 0.9 % IV SOLN: INTRAVENOUS | Status: DC

## 2020-06-22 NOTE — ED Provider Notes (Signed)
  Physical Exam  BP 115/65   Pulse (!) 56   Temp 97.7 F (36.5 C) (Oral)   Resp 16   Ht 5\' 5"  (1.651 m)   Wt (!) 141.5 kg   SpO2 96%   BMI 51.92 kg/m   Physical Exam  ED Course/Procedures     Procedures  MDM  Care assumed at 4 PM.  Patient is here with abdominal pain.  Patient has previous hernia repair.  Signout pending CT abdomen pelvis as well as labs and urinalysis.  6:41 PM Labs unremarkable.  Urinalysis showed bacteria and white blood cells.  Her CT showed no obstruction and there may be some hepatolymphadenopathy.  In particular there is no hydronephrosis.  May be she has early pyelonephritis or UTI.  Will discharge home with a course of Keflex.    Drenda Freeze, MD 06/22/20 [phone]

## 2020-06-22 NOTE — ED Provider Notes (Signed)
[facility] DEPT Provider Note   CSN: [id number] Arrival date & time: 06/22/20  1420     History Chief Complaint  Patient presents with  . Abdominal Pain    Meghan [name] is a 54 y.o. female.  54 year old female here complaining of right upper and lower abdominal pain which began last night.  States that she has a history of ventral wall hernia repair and she is having pain for about 2 weeks but that the pain in her right lower quadrant and right flank began last night.  She had emesis x1 which is described as yellow.  No fever or chills.  Denies any urinary symptoms.  No diarrhea.  She is status post hysterectomy.  Pain is worse with movement and no treatment use prior to arrival.  [facility] and was transported here        Past Medical History:  Diagnosis Date  . Arthritis    left knee  . Asthma   . Atrial fibrillation ([place])   . Atrial fibrillation ([place]) 02/16/2019  . CHF (congestive heart failure) ([place]) 01/2019   Physician from DC informed her she had this   . Dysrhythmia    Atrial fibrillation  . Headache   . Hiatal hernia   . History of pulmonary embolus (PE)   . Hypercholesterolemia   . Macular degeneration    of left eye  . Osteoarthritis    knees  . Pneumonia    in the past  . Pulmonary embolism ([place])    bilat  . Seizure ([place])   . Seizure disorder ([place]) 2017  . Sleep apnea CPAP   [facility]  . Stroke [facility])    1989 and 1995 (left sided weakness)  . Vitamin D deficiency     Patient Active Problem List   Diagnosis Date Noted  . Seizure-like activity ([place]) 03/25/2020  . History of head injury 03/25/2020  . Obstructive sleep apnea syndrome 09/04/2019  . Acute medial meniscus tear of right knee 07/10/2019  . Class 3 severe obesity due to excess calories with serious comorbidity and body mass index (BMI) of 50.0 to 59.9 in adult ([place]) 06/01/2019  . Atrial fibrillation ([place]) 04/17/2019  . History of pulmonary embolus (PE)  04/17/2019  . Type 2 diabetes mellitus, without long-term current use of insulin ([place]) 04/17/2019    Past Surgical History:  Procedure Laterality Date  . ABDOMINAL HYSTERECTOMY  2003   complete  . CARDIAC CATHETERIZATION  2017   in [facility] in [place]  . CESAREAN SECTION  [id number]   x 2  . CESAREAN SECTION     x 2  . CHOLECYSTECTOMY  2003  . COLONOSCOPY WITH PROPOFOL N/A 06/05/2019   Procedure: COLONOSCOPY WITH PROPOFOL;  Surgeon: Carol Ada, MD;  Location: [facility] ENDOSCOPY;  Service: Endoscopy;  Laterality: N/A;  . HERNIA REPAIR  0347, 4259   umbilical hernia repair  . IVC FILTER INSERTION  2003   Hx pulmonary embolus  . POLYPECTOMY  06/05/2019   Procedure: POLYPECTOMY;  Surgeon: Carol Ada, MD;  Location: [facility] ENDOSCOPY;  Service: Endoscopy;;  . SHOULDER ARTHROSCOPY W/ ROTATOR CUFF REPAIR  08/28/2017   right shoulder     OB History   No obstetric history on file.     Family History  Problem Relation Age of Onset  . Breast cancer Mother   . Breast cancer Maternal Grandmother     Social History   Tobacco Use  . Smoking status: Never Smoker  . Smokeless  tobacco: Never Used  Vaping Use  . Vaping Use: Never used  Substance Use Topics  . Alcohol use: Never  . Drug use: Never    Home Medications Prior to Admission medications   Medication Sig Start Date End Date Taking? Authorizing Provider  acetaminophen (TYLENOL 8 HOUR) 650 MG CR tablet Take 1 tablet (650 mg total) by mouth every 8 (eight) hours as needed for pain. Patient taking differently: Take 1,300 mg by mouth every 8 (eight) hours as needed for pain.  02/02/20   Suzy Bouchard, PA-C  albuterol (VENTOLIN HFA) 108 (90 Base) MCG/ACT inhaler Inhale 2 puffs into the lungs every 6 (six) hours as needed for wheezing or shortness of breath.    [provider]  BREZTRI AEROSPHERE 160-9-4.8 MCG/ACT AERO Inhale 2 puffs into the lungs in the morning and at bedtime.  03/24/20   [provider]  Cholecalciferol (VITAMIN D-3) 25 MCG (1000 UT) CAPS Take 1,000 Units by mouth daily.    [provider]  cycloSPORINE (RESTASIS) 0.05 % ophthalmic emulsion Place 1 drop into both eyes 2 (two) times daily.     [provider]  diltiazem (CARDIZEM CD) 120 MG 24 hr capsule Take 240 mg by mouth daily. 12/28/19   [provider]  diltiazem (TIAZAC) 120 MG 24 hr capsule Take 240 mg by mouth daily. Patient not taking: Reported on 05/11/2020 04/04/20   [provider]  flecainide (TAMBOCOR) 50 MG tablet Take 50 mg by mouth 2 (two) times daily.  09/04/19   [provider]  furosemide (LASIX) 40 MG tablet Take 40 mg by mouth daily with breakfast.     [provider]  HYDROcodone-acetaminophen (NORCO/VICODIN) 5-325 MG tablet Take 2 tablets by mouth every 6 (six) hours as needed for moderate pain. 05/11/20 05/11/21  Fransico Meadow, PA-C  metFORMIN (GLUCOPHAGE) 500 MG tablet Take 500 mg by mouth 2 (two) times daily with a meal.     [provider]  Multiple Vitamins-Minerals (PRESERVISION AREDS 2 PO) Take 2 capsules by mouth daily with breakfast.  Patient not taking: Reported on 05/11/2020    [provider]  naproxen (NAPROSYN) 500 MG tablet Take 1 tablet (500 mg total) by mouth 2 (two) times daily. Patient not taking: Reported on 05/11/2020 02/18/20   Joy, Helane Gunther, PA-C  Omega-3 Fatty Acids (FISH OIL) 1000 MG CAPS Take 1,000 mg by mouth daily with breakfast.     [provider]  ondansetron (ZOFRAN ODT) 4 MG disintegrating tablet Take 1 tablet (4 mg total) by mouth every 8 (eight) hours as needed for nausea or vomiting. Patient taking differently: Take 4 mg by mouth every 8 (eight) hours as needed for nausea or vomiting (DISSOLVE ORALLY).  02/02/20   Suzy Bouchard, PA-C  OZEMPIC, 0.25 OR 0.5 MG/DOSE, 2 MG/1.5ML SOPN Inject 1 mg into the skin every Tuesday.  12/18/19   [provider]  rivaroxaban (XARELTO) 20  MG TABS tablet Take 20 mg by mouth at bedtime.     [provider]  tiZANidine (ZANAFLEX) 4 MG tablet Take 4 mg by mouth at bedtime as needed for muscle spasms.     [provider]  Vitamin D, Ergocalciferol, (DRISDOL) 1.25 MG (50000 UNIT) CAPS capsule Take 50,000 Units by mouth once a week. Patient not taking: Reported on 05/11/2020 02/02/20   [provider]    Allergies    Other, Acetaminophen-codeine, Coconut oil, Meloxicam, and Tomato  Review of Systems  Review of Systems  All other systems reviewed and are negative.   Physical Exam Updated Vital Signs BP 138/61 (BP Location: Left Arm)   Pulse 62   Temp 97.7 F (36.5 C) (Oral)   Resp 17   Ht 1.651 m (5\' 5" )   Wt (!) 141.5 kg   SpO2 100%   BMI 51.92 kg/m   Physical Exam Vitals and nursing note reviewed.  Constitutional:      General: She is not in acute distress.    Appearance: Normal appearance. She is well-developed. She is not toxic-appearing.  HENT:     Head: Normocephalic and atraumatic.  Eyes:     General: Lids are normal.     Conjunctiva/sclera: Conjunctivae normal.     Pupils: Pupils are equal, round, and reactive to light.  Neck:     Thyroid: No thyroid mass.     Trachea: No tracheal deviation.  Cardiovascular:     Rate and Rhythm: Normal rate and regular rhythm.     Heart sounds: Normal heart sounds. No murmur heard.  No gallop.   Pulmonary:     Effort: Pulmonary effort is normal. No respiratory distress.     Breath sounds: Normal breath sounds. No stridor. No decreased breath sounds, wheezing, rhonchi or rales.  Abdominal:     General: Bowel sounds are normal. There is no distension.     Palpations: Abdomen is soft.     Tenderness: There is abdominal tenderness in the right lower quadrant. There is guarding. There is no rebound.    Musculoskeletal:        General: No tenderness. Normal range of motion.     Cervical back: Normal range of motion and neck supple.  Skin:     General: Skin is warm and dry.     Findings: No abrasion or rash.  Neurological:     Mental Status: She is alert and oriented to person, place, and time.     GCS: GCS eye subscore is 4. GCS verbal subscore is 5. GCS motor subscore is 6.     Cranial Nerves: No cranial nerve deficit.     Sensory: No sensory deficit.  Psychiatric:        Speech: Speech normal.        Behavior: Behavior normal.     ED Results / Procedures / Treatments   Labs (all labs ordered are listed, but only abnormal results are displayed) Labs Reviewed  URINE CULTURE  RESPIRATORY PANEL BY RT PCR (FLU A&B, COVID)  CBC WITH DIFFERENTIAL/PLATELET  COMPREHENSIVE METABOLIC PANEL  LIPASE, BLOOD  URINALYSIS, ROUTINE W REFLEX MICROSCOPIC    EKG None  Radiology No results found.  Procedures Procedures (including critical care time)  Medications Ordered in ED Medications  sodium chloride 0.9 % bolus 500 mL (has no administration in time range)  0.9 %  sodium chloride infusion (has no administration in time range)  HYDROmorphone (DILAUDID) injection 1 mg (has no administration in time range)  ondansetron (ZOFRAN) injection 4 mg (has no administration in time range)    ED Course  I have reviewed the triage vital signs and the nursing notes.  Pertinent labs & imaging results that were available during my care of the patient were reviewed by me and considered in my medical decision making (see chart for details).    MDM Rules/Calculators/A&P                         Labs and  x-rays pending at this time.  Pain medication ordered.  Care signed out to Dr. Darl Householder Final Clinical Impression(s) / ED Diagnoses Final diagnoses:  None    Rx / DC Orders ED Discharge Orders    None       Lacretia Leigh, MD 06/22/20 1611

## 2020-06-22 NOTE — Discharge Instructions (Signed)
You have some swollen lymph nodes around your liver.  This may be from a kidney infection  Take Keflex 3 times daily for a week.  Stay hydrated  See your doctor for follow-up  Take vicodin for severe pain   Return to ER if you have worsening abdominal pain or vomiting or fevers

## 2020-06-22 NOTE — ED Notes (Signed)
Pt received Dilaudid earlier and educated about not driving home. Pt currently waiting for a ride.

## 2020-06-22 NOTE — ED Triage Notes (Signed)
Pt BIBA from PCP's office-  Per EMS- Pt c/o RLQ pain starting last night, took tylenol and ibuprofen- no relief.  Pain progressively worsening. Tender on palpation, increased rebound tenderness.  Nausea, emesis x1.

## 2020-06-23 ENCOUNTER — Telehealth: Payer: Self-pay | Admitting: *Deleted

## 2020-06-23 ENCOUNTER — Telehealth (HOSPITAL_COMMUNITY): Payer: Self-pay | Admitting: Emergency Medicine

## 2020-06-23 LAB — URINE CULTURE

## 2020-06-23 MED ORDER — HYDROCODONE-ACETAMINOPHEN 5-325 MG PO TABS: 1.0000 | ORAL_TABLET | Freq: Four times a day (QID) | ORAL | 0 refills | Status: DC | PRN

## 2020-06-23 NOTE — Telephone Encounter (Signed)
TOC CM spoke to ED provider and RX was sent to [facility] on [address]. Contact [address] [facility] and they received RX and called pt. Updated ED provider. [place], [place] ED TOC CM [phone]

## 2020-06-23 NOTE — Telephone Encounter (Signed)
Original prescription didn't go through. Try a different pharmacy

## 2020-06-23 NOTE — Telephone Encounter (Signed)
Called in prescription to pharmacy.

## 2020-06-23 NOTE — Telephone Encounter (Signed)
TOC CM received call from pt stating her medications are not at her pharmacy. Contacted [facility] and was able to call in Keflex. Pharmacist states escribed Rx were not received. He will have his IT dept follow up. Ed provider updated on problem with medication. PT was thankful for abx Rx being called in and will follow up with her PCP to schedule an appt. States Tylenol is not helping with pain. Instructed pt to contact PCP today to follow up on an appt. [place], [place] ED TOC CM [phone]

## 2020-06-27 ENCOUNTER — Encounter: Payer: Self-pay | Admitting: Neurology

## 2020-06-27 ENCOUNTER — Other Ambulatory Visit: Payer: Self-pay

## 2020-06-27 ENCOUNTER — Ambulatory Visit (INDEPENDENT_AMBULATORY_CARE_PROVIDER_SITE_OTHER): Payer: Medicare Other | Admitting: Neurology

## 2020-06-27 ENCOUNTER — Ambulatory Visit (INDEPENDENT_AMBULATORY_CARE_PROVIDER_SITE_OTHER): Payer: Medicare Other | Admitting: Licensed Clinical Social Worker

## 2020-06-27 VITALS — BP 109/69 | HR 75 | Ht 65.0 in | Wt 314.0 lb

## 2020-06-27 DIAGNOSIS — R569 Unspecified convulsions: Secondary | ICD-10-CM

## 2020-06-27 DIAGNOSIS — F4310 Post-traumatic stress disorder, unspecified: Secondary | ICD-10-CM

## 2020-06-27 DIAGNOSIS — F331 Major depressive disorder, recurrent, moderate: Secondary | ICD-10-CM

## 2020-06-27 NOTE — Progress Notes (Signed)
Virtual Visit via Video Note   I connected with Meghan [name] on 06/27/20 at 3:00pm by video enabled telemedicine application and verified that I am speaking with the correct person using two identifiers.   I discussed the limitations, risks, security and privacy concerns of performing an evaluation and management service by video and the availability of in person appointments. I also discussed with the patient that there may be a patient responsible charge related to this service. The patient expressed understanding and agreed to proceed.   I discussed the assessment and treatment plan with the patient. The patient was provided an opportunity to ask questions and all were answered. The patient agreed with the plan and demonstrated an understanding of the instructions.   The patient was advised to call back or seek an in-person evaluation if the symptoms worsen or if the condition fails to improve as anticipated.   I provided 1 hour of non-face-to-face time during this encounter.     Meghan Flood, LCSW, LCAS _______________________ THERAPIST PROGRESS NOTE   Session Time: 3:00pm - 4:00pm  Location: Patient: Patient Home Provider: [place] [name] Office    Participation Level: Active    Behavioral Response: Alert, casually dressed, irritable mood/affect   Type of Therapy:  Individual Therapy   Treatment Goals addressed: Anxiety/depression management; medication management and medical appointments; Meghan [name] attendance and related community activities   Interventions: CBT, problem solving    Summary: Meghan [name] is a 54 year old African American female that presented for virtual appointment today with diagnoses of Major Depressive Disorder, recurrent, moderate, and PTSD.      Suicidal/Homicidal: None; without plan or intent    Therapist Response:  Clinician met with Meghan [name] for virtual appointment today and assessed for safety, sobriety, and medication compliance.  Meghan [name] presented for this appointment  on time and was alert, oriented x5, with no evidence or self-report of SI/HI or A/V H.  Meghan [name] reported that she has been compliant with medications and denied any use of alcohol or illicit substances.  Clinician inquired about Meghan [name]'s current emotional ratings, as well as any significant changes in thoughts, feelings, or behavior since previous check-in.  Meghan [name] reported scores of 0/10 for both depression and anxiety, but noted that she was feeling "Meghan [name]" today on a scale of 10/10, and elaborated on this feeling as including low motivation, and energy.  Clinician inquired about Meghan [name]'s struggles and successes in previous week, and how this has contributed to current mood.  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  Clinician assisted Meghan [name] in weighing pros and cons of this decision, and how it would impact her overall mental health.  Meghan [name] reported that although she was hesitant at first, looking at this from different angles helped her realize that there are several benefits which are appealing and could serve as a positive, productive use of her time while contributing to the church in a meaningful way, so she will speak with the pastor and other dance member at their next meeting to go over potential terms of acceptance.  Interventions were effective, as evidenced by Meghan [name] reporting that discussing this matter made her feel better, more  confident in her decision making skills, and stated "I feel less 'yucky' now, maybe around a 5/10.  I'm going to get some rest over the next two days too because I think me being tired is not helping".  Clinician will  continue to monitor.                                          Plan: Follow up again in 1 week virtually.    Diagnosis: Major depressive disorder, recurrent, moderate; PTSD    Meghan Flood, LCSW, LCAS 06/27/20
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
## 2020-06-30 ENCOUNTER — Encounter: Payer: Self-pay | Admitting: Vascular Surgery

## 2020-06-30 ENCOUNTER — Ambulatory Visit (INDEPENDENT_AMBULATORY_CARE_PROVIDER_SITE_OTHER): Payer: Medicare Other | Admitting: Vascular Surgery

## 2020-06-30 ENCOUNTER — Other Ambulatory Visit: Payer: Self-pay

## 2020-06-30 VITALS — BP 115/72 | HR 78 | Temp 97.7°F | Resp 16 | Ht 65.0 in | Wt 310.0 lb

## 2020-06-30 DIAGNOSIS — I872 Venous insufficiency (chronic) (peripheral): Secondary | ICD-10-CM | POA: Diagnosis not present

## 2020-06-30 NOTE — Progress Notes (Addendum)
REASON FOR VISIT:   Follow-up of painful varicose veins.  MEDICAL ISSUES:   CHRONIC VENOUS INSUFFICIENCY: This patient has CEAP C4a venous disease.  She does not have significant superficial venous reflux.  She does have deep venous reflux.  We have discussed the importance of intermittent leg elevation and the proper positioning for this.  In addition I have encouraged her to continue to wear her compression stockings.  I have encouraged her to avoid prolonged sitting and standing.  She sits for long periods of time and is working on a book.  I encouraged her to get up and move around as much as she can and to work some on her computer with her legs elevated.  We have discussed the importance of exercise specifically walking and water aerobics.  We also discussed the importance of maintaining a healthy weight as central obesity especially increases lower extremity venous pressure.  Her BMI is 51 and certainly this is a large component contributing to her venous disease.  Currently she is not a candidate for laser ablation or stab phlebectomies.  I explained that venous disease does tend to progress and if her symptoms progress or she develops large varicose veins certainly she could be reevaluated in the future.  HPI:   Meghan [name] is a pleasant 54 y.o. female who was seen by Karoline Caldwell, PA on 03/28/2020 with painful varicose veins and left lower extremity swelling.  Patient had had varicose veins for approximately 3 years but was developing increasing pain especially in the left leg that began about a month prior.  The patient has had a history of a left lower extremity DVT.  The patient also had a pulmonary embolus back in the 1990s and again in 2003.  She had an IVC filter placed.  She is on Xarelto for atrial fibrillation.  She was having significant aching pain and heaviness in her legs that was aggravated by sitting and standing.  Her symptoms were relieved with elevation.  Compression  therapy did not help her symptoms.  He has had no previous venous procedures.  On exam she had palpable pedal pulses.  Her venous duplex scan of the left lower extremity at that time showed no evidence of DVT.  She had reflux at the saphenofemoral junction only.  There was some reflux in the small saphenous vein at the popliteal fossa.  There was deep venous reflux involving the common femoral vein and femoral vein.  There was also reflux in an anterior accessory saphenous vein.  At that time they discussed weight reduction, the importance of exercise, the use of thigh-high compression stockings with a gradient of 20 to 30 mmHg, and elevation.  She comes in for a 72-month follow-up visit.  The patient complains of continued aching pain in her left leg only which is aggravated by sitting and standing and relieved with elevation.  She does wear compression stockings which helps some.  She has been wearing thigh-high compression stockings with a gradient of 20 to 30 mmHg.  She also experiences pain in her leg when she walks and I suspect this is from venous hypertension.   Past Medical History:  Diagnosis Date  . Arthritis    left knee  . Asthma   . Atrial fibrillation ([place])   . Atrial fibrillation ([place]) 02/16/2019  . CHF (congestive heart failure) ([name]) 01/2019   Physician from [place] informed her she had this   . Dysrhythmia    Atrial fibrillation  . Headache   .  Hiatal hernia   . History of pulmonary embolus ([place])   . Hypercholesterolemia   . Macular degeneration    of left eye  . Osteoarthritis    knees  . Pneumonia    in the past  . Pulmonary embolism ([place])    bilat  . Seizure ([place])   . Seizure disorder ([place]) 2017  . Sleep apnea CPAP   [facility]  . Stroke [facility])    1989 and 1995 (left sided weakness)  . Vitamin D deficiency     Family History  Problem Relation Age of Onset  . Breast cancer Mother   . Breast cancer Maternal Grandmother   . Heart attack Father   . Diabetes Father     . Congestive Heart Failure Father     SOCIAL HISTORY: Social History   Tobacco Use  . Smoking status: Never Smoker  . Smokeless tobacco: Never Used  Substance Use Topics  . Alcohol use: Never    Allergies  Allergen Reactions  . Other Hives and Other (See Comments)    Muscle relaxer that starts with a "T" caused hives (not tizanidine) January or February 2020- made the patient VERY sick  . Acetaminophen-Codeine Swelling, Rash and Other (See Comments)    Tylenol with Codeine, Tylenol #3,  (facial swelling)  . Coconut Oil Rash and Other (See Comments)    ANY coconut products   . Meloxicam Rash  . Tomato Rash    Current Outpatient Medications  Medication Sig Dispense Refill  . acetaminophen (TYLENOL) 500 MG tablet Take 500 mg by mouth every 6 (six) hours as needed.    Marland Kitchen albuterol (VENTOLIN HFA) 108 (90 Base) MCG/ACT inhaler Inhale 2 puffs into the lungs every 6 (six) hours as needed for wheezing or shortness of breath.    Marland Kitchen BREZTRI AEROSPHERE 160-9-4.8 MCG/ACT AERO Inhale 2 puffs into the lungs in the morning and at bedtime.     . cephALEXin (KEFLEX) 500 MG capsule Take 1 capsule (500 mg total) by mouth 3 (three) times daily. 21 capsule 0  . Cholecalciferol (VITAMIN D-3) 25 MCG (1000 UT) CAPS Take 1,000 Units by mouth daily.    . cycloSPORINE (RESTASIS) 0.05 % ophthalmic emulsion Place 1 drop into both eyes 2 (two) times daily.     . flecainide (TAMBOCOR) 50 MG tablet Take 50 mg by mouth 2 (two) times daily.     . furosemide (LASIX) 40 MG tablet Take 40 mg by mouth daily with breakfast.     . HYDROcodone-acetaminophen (NORCO/VICODIN) 5-325 MG tablet Take 1 tablet by mouth every 6 (six) hours as needed. 8 tablet 0  . metFORMIN (GLUCOPHAGE) 500 MG tablet Take 500 mg by mouth 2 (two) times daily with a meal.     . Multiple Vitamins-Minerals (PRESERVISION AREDS 2 PO) Take 2 capsules by mouth daily with breakfast.     . Omega-3 Fatty Acids (FISH OIL) 1000 MG CAPS Take 1,000 mg by  mouth daily with breakfast.     . ondansetron (ZOFRAN ODT) 4 MG disintegrating tablet Take 1 tablet (4 mg total) by mouth every 8 (eight) hours as needed for nausea or vomiting. (Patient taking differently: Take 4 mg by mouth every 8 (eight) hours as needed for nausea or vomiting (DISSOLVE ORALLY). ) 20 tablet 0  . OZEMPIC, 0.25 OR 0.5 MG/DOSE, 2 MG/1.5ML SOPN Inject 1 mg into the skin every Tuesday.     . rivaroxaban (XARELTO) 20 MG TABS tablet Take 20 mg by mouth at bedtime.     Marland Kitchen  tiZANidine (ZANAFLEX) 4 MG tablet Take 4 mg by mouth at bedtime as needed for muscle spasms.      No current facility-administered medications for this visit.    REVIEW OF SYSTEMS:  [X]  denotes positive finding, [ ]  denotes negative finding Cardiac  Comments:  Chest pain or chest pressure:    Shortness of breath upon exertion:    Short of breath when lying flat:    Irregular heart rhythm:        Vascular    Pain in calf, thigh, or hip brought on by ambulation: x   Pain in feet at night that wakes you up from your sleep:  x   Blood clot in your veins:    Leg swelling:  x       Pulmonary    Oxygen at home:    Productive cough:     Wheezing:         Neurologic    Sudden weakness in arms or legs:     Sudden numbness in arms or legs:     Sudden onset of difficulty speaking or slurred speech:    Temporary loss of vision in one eye:     Problems with dizziness:         Gastrointestinal    Blood in stool:     Vomited blood:         Genitourinary    Burning when urinating:     Blood in urine:        Psychiatric    Major depression:  x       Hematologic    Bleeding problems:    Problems with blood clotting too easily:        Skin    Rashes or ulcers:        Constitutional    Fever or chills:     PHYSICAL EXAM:   Vitals:   06/30/20 1326  BP: 115/72  Pulse: 78  Resp: 16  Temp: 97.7 F (36.5 C)  TempSrc: Temporal  SpO2: 100%  Weight: (!) 310 lb (140.6 kg)  Height: 5\' 5"  (1.651 m)    Body mass index is 51.59 kg/m.  GENERAL: The patient is a well-nourished female, in no acute distress. The vital signs are documented above. CARDIAC: There is a regular rate and rhythm.  VASCULAR: I do not detect carotid bruits. She has palpable dorsalis pedis pulses bilaterally. She has hyperpigmentation of her left medial malleolus. Currently she has only minimal bilateral lower extremity swelling. I did look at her great saphenous vein myself on the left and there was no significant reflux.  The anterior sensory saphenous vein was tortuous and did not appear to be contributing to her venous hypertension. PULMONARY: There is good air exchange bilaterally without wheezing or rales. ABDOMEN: Soft and non-tender with normal pitched bowel sounds.  MUSCULOSKELETAL: There are no major deformities or cyanosis. NEUROLOGIC: No focal weakness or paresthesias are detected. SKIN: There are no ulcers or rashes noted. PSYCHIATRIC: The patient has a normal affect.  DATA:    VENOUS DUPLEX: I reviewed her venous duplex scan from 03/28/2020.  This was of the left lower extremity only.  There was no evidence of DVT or superficial venous thrombosis.  There was deep venous reflux involving the common femoral vein and femoral vein.  There was no significant superficial venous reflux.  Deitra Mayo [facility] [phone]

## 2020-07-04 ENCOUNTER — Other Ambulatory Visit: Payer: Self-pay

## 2020-07-04 ENCOUNTER — Ambulatory Visit (INDEPENDENT_AMBULATORY_CARE_PROVIDER_SITE_OTHER): Payer: Medicare Other | Admitting: Licensed Clinical Social Worker

## 2020-07-04 DIAGNOSIS — F331 Major depressive disorder, recurrent, moderate: Secondary | ICD-10-CM | POA: Diagnosis not present

## 2020-07-04 DIAGNOSIS — F4310 Post-traumatic stress disorder, unspecified: Secondary | ICD-10-CM

## 2020-07-04 NOTE — Progress Notes (Signed)
Virtual Visit via Video Note   I connected with Meghan [name] on 07/04/20 at 3:00pm by video enabled telemedicine application and verified that I am speaking with the correct person using two identifiers.   I discussed the limitations, risks, security and privacy concerns of performing an evaluation and management service by video and the availability of in person appointments. I also discussed with the patient that there may be a patient responsible charge related to this service. The patient expressed understanding and agreed to proceed.   I discussed the assessment and treatment plan with the patient. The patient was provided an opportunity to ask questions and all were answered. The patient agreed with the plan and demonstrated an understanding of the instructions.   The patient was advised to call back or seek an in-person evaluation if the symptoms worsen or if the condition fails to improve as anticipated.   I provided 1 hour of non-face-to-face time during this encounter.     [name], LCSW, LCAS _______________________ THERAPIST PROGRESS NOTE   Session Time: 3:00pm - 4:00pm  Location: Patient: Patient Home Provider: [place] Office    Participation Level: Active    Behavioral Response: Alert, casually dressed, anxious mood/affect   Type of Therapy:  Individual Therapy   Treatment Goals addressed: Anxiety/depression management; medication management and medical appointments; Healthy boundaries     Interventions: CBT, anger management, healthy boundaries in relationships    Summary: Meghan [name] is a 54 year old African American female that presented for virtual appointment today with diagnoses of Major Depressive Disorder, recurrent, moderate, and PTSD.      Suicidal/Homicidal: None; without plan or intent    Therapist Response:  Clinician met with Meghan [name] for virtual session and assessed for safety, sobriety, and medication compliance.  Meghan [name] presented for today's session on  time and was alert, oriented x5, with no evidence or self-report of SI/HI or A/V H.  Meghan [name] reported ongoing compliance with medications and denied any use of alcohol or illicit substances.  Clinician inquired about [name]'s emotional ratings today, as well as any significant changes in thoughts, feelings, or behavior since last meeting.  Meghan [name] reported scores of 0/10 for depression and 10/10 for anxiety, noting that she has not had any panic attacks, but is anxious about upcoming surgery on Thursday.  Meghan [name] reported that her anger would have rated at 10/10 yesterday due to an argument that she had when someone that was 'pushing her buttons'.  Clinician inquired about details of this event, including Meghan [name]'s use of coping skills to de-escalate beforehand. Meghan [name] reported that she has been talking to a man for a few weeks and he has continually let her down and tried to violate personal boundaries through manipulation, culminating in an argument over the phone which caused her angry alter ego 'Nay Nay' to surface and curse him out. Meghan [name] reported that she has tried to be respectful and polite in expressing her needs, but when he became rude and pushy she had to speak up for herself, and has now blocked him and informed him that they should ignore each other if encountering one another on the bus again.  Meghan [name] also reported that she spoke with 2 church friends afterward and were able to laugh about it, stating "It felt like a huge weight was lifted off my shoulders and I felt a lot better".  Clinician discussed importance of maintaining healthy boundaries and praised Meghan [name] for efforts in establishing limits on both physical and time boundaries when interacting  with this person prior to outburst.  Clinician was also supportive of her use of support system and planning ahead for any future interactions that could occur and negatively influence her mood.  Clinician covered some anger management techniques that could be  useful in future, given current level of stress that Meghan [name] has been under, such as monitoring oneself with mental 'anger thermometer', taking time outs as needed, taking brisk walks, or punching a pillow or other soft object to release physical tension.  Interventions were effective, as evidenced by Meghan [name] reporting that therapy continues to be beneficial for helping her become more assertive and put herself first when she used to give into others demands, stating "I usually hold back and don't speak my mind, but this is me and how I really feel".  Clinician will continue to monitor.                                          Plan: Follow up again in 1 week virtually.    Diagnosis: Major depressive disorder, recurrent, moderate; PTSD    [name], LCSW, LCAS 07/04/20

## 2020-07-05 ENCOUNTER — Encounter (HOSPITAL_COMMUNITY): Payer: Self-pay | Admitting: *Deleted

## 2020-07-05 ENCOUNTER — Other Ambulatory Visit: Payer: Self-pay

## 2020-07-05 ENCOUNTER — Emergency Department (HOSPITAL_COMMUNITY): Payer: Medicare Other

## 2020-07-05 ENCOUNTER — Emergency Department (HOSPITAL_COMMUNITY)
Admission: EM | Admit: 2020-07-05 | Discharge: 2020-07-05 | Disposition: A | Discharge status: Home / Self Care | Payer: Medicare Other | Attending: Emergency Medicine | Admitting: Emergency Medicine

## 2020-07-05 DIAGNOSIS — Y9239 Other specified sports and athletic area as the place of occurrence of the external cause: Secondary | ICD-10-CM | POA: Diagnosis not present

## 2020-07-05 DIAGNOSIS — E119 Type 2 diabetes mellitus without complications: Secondary | ICD-10-CM | POA: Insufficient documentation

## 2020-07-05 DIAGNOSIS — I110 Hypertensive heart disease with heart failure: Secondary | ICD-10-CM | POA: Insufficient documentation

## 2020-07-05 DIAGNOSIS — I509 Heart failure, unspecified: Secondary | ICD-10-CM | POA: Diagnosis not present

## 2020-07-05 DIAGNOSIS — Z86711 Personal history of pulmonary embolism: Secondary | ICD-10-CM | POA: Diagnosis not present

## 2020-07-05 DIAGNOSIS — S63502A Unspecified sprain of left wrist, initial encounter: Secondary | ICD-10-CM | POA: Insufficient documentation

## 2020-07-05 DIAGNOSIS — Z7984 Long term (current) use of oral hypoglycemic drugs: Secondary | ICD-10-CM | POA: Diagnosis not present

## 2020-07-05 DIAGNOSIS — J45909 Unspecified asthma, uncomplicated: Secondary | ICD-10-CM | POA: Diagnosis not present

## 2020-07-05 DIAGNOSIS — S6992XA Unspecified injury of left wrist, hand and finger(s), initial encounter: Secondary | ICD-10-CM | POA: Diagnosis present

## 2020-07-05 DIAGNOSIS — W01198A Fall on same level from slipping, tripping and stumbling with subsequent striking against other object, initial encounter: Secondary | ICD-10-CM | POA: Diagnosis not present

## 2020-07-05 DIAGNOSIS — Y9301 Activity, walking, marching and hiking: Secondary | ICD-10-CM | POA: Insufficient documentation

## 2020-07-05 DIAGNOSIS — R2232 Localized swelling, mass and lump, left upper limb: Secondary | ICD-10-CM

## 2020-07-05 DIAGNOSIS — Z7901 Long term (current) use of anticoagulants: Secondary | ICD-10-CM | POA: Diagnosis not present

## 2020-07-05 DIAGNOSIS — W19XXXA Unspecified fall, initial encounter: Secondary | ICD-10-CM

## 2020-07-05 MED ORDER — HYDROCODONE-ACETAMINOPHEN 5-325 MG PO TABS: 1.0000 | ORAL_TABLET | ORAL | 0 refills | Status: DC | PRN

## 2020-07-05 MED ORDER — OXYCODONE-ACETAMINOPHEN 5-325 MG PO TABS
1.0000 | ORAL_TABLET | Freq: Once | ORAL | Status: AC
  Administered 2020-07-05: 1 via ORAL
  Filled 2020-07-05: qty 1

## 2020-07-05 NOTE — ED Provider Notes (Signed)
[facility] DEPT Provider Note   CSN: [id number] Arrival date & time: 07/05/20  1027    History Chief Complaint  Patient presents with  . Fall  . Wrist Pain  . Knee Pain    Meghan [name] is a 54 y.o. female with past history significant for HTN, CHF, PE on Xarelto who presents for evaluation mechanical fall.  Was walking at the gym when she tripped and fell.  She denies hitting her head, LOC or anticoagulation.  Landed on her right knee as well as her left hand and wrist.  Has felt pain to her right early and left wrist since the incident.  Has been able to ambulate without difficulty.  Tetanus up-to-date.  Feels like she has decreased range of motion to her left wrist.  Wiggles fingers without difficulty.  Able to flex and extend the elbow.  No proximal, midshaft tibia or fibula pain.  Denies additional aggravating or relieving factors.  She is not taking anything prior to arrival.  Rates her pain a 9/10.  Has noticed some swelling to her left wrist.  History obtained from patient and past medical records. No interpretor was used.  HPI     Past Medical History:  Diagnosis Date  . Arthritis    left knee  . Asthma   . Atrial fibrillation ([place])   . Atrial fibrillation ([place]) 02/16/2019  . CHF (congestive heart failure) ([place]) 01/2019   Physician from [place] informed her she had this   . Dysrhythmia    Atrial fibrillation  . Headache   . Hiatal hernia   . History of pulmonary embolus (PE)   . Hypercholesterolemia   . Macular degeneration    of left eye  . Osteoarthritis    knees  . Pneumonia    in the past  . Pulmonary embolism ([place])    bilat  . Seizure ([place])   . Seizure disorder ([place]) 2017  . Sleep apnea CPAP   [facility]  . Stroke [facility])    1989 and 1995 (left sided weakness)  . Vitamin D deficiency     Patient Active Problem List   Diagnosis Date Noted  . Seizure-like activity ([place]) 03/25/2020  . History of head injury 03/25/2020  .  Obstructive sleep apnea syndrome 09/04/2019  . Acute medial meniscus tear of right knee 07/10/2019  . Class 3 severe obesity due to excess calories with serious comorbidity and body mass index (BMI) of 50.0 to 59.9 in adult ([place]) 06/01/2019  . Atrial fibrillation ([place]) 04/17/2019  . History of pulmonary embolus (PE) 04/17/2019  . Type 2 diabetes mellitus, without long-term current use of insulin ([place]) 04/17/2019    Past Surgical History:  Procedure Laterality Date  . ABDOMINAL HYSTERECTOMY  2003   complete  . CARDIAC CATHETERIZATION  2017   in [facility] in [place]  . CESAREAN SECTION  1448,[id number]   x 2  . CESAREAN SECTION     x 2  . CHOLECYSTECTOMY  2003  . COLONOSCOPY WITH PROPOFOL N/A 06/05/2019   Procedure: COLONOSCOPY WITH PROPOFOL;  Surgeon: Carol Ada, MD;  Location: [facility] ENDOSCOPY;  Service: Endoscopy;  Laterality: N/A;  . HERNIA REPAIR  3149, 7026   umbilical hernia repair  . IVC FILTER INSERTION  2003   Hx pulmonary embolus  . POLYPECTOMY  06/05/2019   Procedure: POLYPECTOMY;  Surgeon: Carol Ada, MD;  Location: [facility] ENDOSCOPY;  Service: Endoscopy;;  . SHOULDER ARTHROSCOPY W/ ROTATOR CUFF REPAIR  08/28/2017   right  shoulder     OB History   No obstetric history on file.     Family History  Problem Relation Age of Onset  . Breast cancer Mother   . Breast cancer Maternal Grandmother   . Heart attack Father   . Diabetes Father   . Congestive Heart Failure Father     Social History   Tobacco Use  . Smoking status: Never Smoker  . Smokeless tobacco: Never Used  Vaping Use  . Vaping Use: Never used  Substance Use Topics  . Alcohol use: Never  . Drug use: Never    Home Medications Prior to Admission medications   Medication Sig Start Date End Date Taking? Authorizing Provider  acetaminophen (TYLENOL) 500 MG tablet Take 500 mg by mouth every 6 (six) hours as needed.    [provider]  albuterol (VENTOLIN HFA) 108 (90 Base) MCG/ACT  inhaler Inhale 2 puffs into the lungs every 6 (six) hours as needed for wheezing or shortness of breath.    [provider]  BREZTRI AEROSPHERE 160-9-4.8 MCG/ACT AERO Inhale 2 puffs into the lungs in the morning and at bedtime.  03/24/20   [provider]  cephALEXin (KEFLEX) 500 MG capsule Take 1 capsule (500 mg total) by mouth 3 (three) times daily. 06/22/20   Drenda Freeze, MD  Cholecalciferol (VITAMIN D-3) 25 MCG (1000 UT) CAPS Take 1,000 Units by mouth daily.    [provider]  cycloSPORINE (RESTASIS) 0.05 % ophthalmic emulsion Place 1 drop into both eyes 2 (two) times daily.     [provider]  flecainide (TAMBOCOR) 50 MG tablet Take 50 mg by mouth 2 (two) times daily.  09/04/19   [provider]  furosemide (LASIX) 40 MG tablet Take 40 mg by mouth daily with breakfast.     [provider]  HYDROcodone-acetaminophen (NORCO/VICODIN) 5-325 MG tablet Take 1 tablet by mouth every 4 (four) hours as needed. 07/05/20   Mykira Hofmeister A, PA-C  metFORMIN (GLUCOPHAGE) 500 MG tablet Take 500 mg by mouth 2 (two) times daily with a meal.     [provider]  Multiple Vitamins-Minerals (PRESERVISION AREDS 2 PO) Take 2 capsules by mouth daily with breakfast.     [provider]  Omega-3 Fatty Acids (FISH OIL) 1000 MG CAPS Take 1,000 mg by mouth daily with breakfast.     [provider]  ondansetron (ZOFRAN ODT) 4 MG disintegrating tablet Take 1 tablet (4 mg total) by mouth every 8 (eight) hours as needed for nausea or vomiting. Patient taking differently: Take 4 mg by mouth every 8 (eight) hours as needed for nausea or vomiting (DISSOLVE ORALLY).  02/02/20   Suzy Bouchard, PA-C  OZEMPIC, 0.25 OR 0.5 MG/DOSE, 2 MG/1.5ML SOPN Inject 1 mg into the skin every Tuesday.  12/18/19   [provider]  rivaroxaban (XARELTO) 20 MG TABS tablet Take 20 mg by mouth at bedtime.     [provider]  tiZANidine  (ZANAFLEX) 4 MG tablet Take 4 mg by mouth at bedtime as needed for muscle spasms.     [provider]    Allergies    Other, Acetaminophen-codeine, Coconut oil, Meloxicam, and Tomato  Review of Systems   Review of Systems  Constitutional: Negative.   HENT: Negative.   Respiratory: Negative.   Cardiovascular: Negative.   Gastrointestinal: Negative.   Genitourinary: Negative.   Musculoskeletal:       Left wrist pain, right knee pain  Skin: Negative.  Neurological: Negative.   All other systems reviewed and are negative.  Physical Exam Updated Vital Signs BP (!) 128/58 (BP Location: Right Arm)   Pulse 79   Temp 98 F (36.7 C) (Oral)   Resp 19   SpO2 98%   Physical Exam Vitals and nursing note reviewed.  Constitutional:      General: She is not in acute distress.    Appearance: She is well-developed. She is not ill-appearing, toxic-appearing or diaphoretic.  HENT:     Head: Normocephalic and atraumatic.     Mouth/Throat:     Mouth: Mucous membranes are moist.     Pharynx: Oropharynx is clear.  Eyes:     Pupils: Pupils are equal, round, and reactive to light.  Cardiovascular:     Rate and Rhythm: Normal rate.     Pulses: Normal pulses.     Heart sounds: Normal heart sounds.  Pulmonary:     Effort: Pulmonary effort is normal. No respiratory distress.     Breath sounds: Normal breath sounds.  Abdominal:     General: Bowel sounds are normal. There is no distension.     Palpations: There is no mass.     Tenderness: There is no abdominal tenderness. There is no right CVA tenderness, left CVA tenderness, guarding or rebound.     Hernia: No hernia is present.  Musculoskeletal:        General: Normal range of motion.     Cervical back: Normal range of motion.     Comments: Tenderness to anterior right knee.  Able to flex and extend without difficulty.  Negative varus, valgus stress to bilateral knees.  Full range of motion to right upper extremity.  Tenderness  with flexion extension, pronation supination to left wrist.  Tenderness at left scaphoid.  Wiggles digits without difficulty.  No tenderness to midshaft, proximal radius or ulna.  Nontender bilateral humerus.  No midline cervical, thoracic, lumbar tenderness, crepitus or step-offs.  Skin:    General: Skin is warm and dry.     Capillary Refill: Capillary refill takes less than 2 seconds.     Comments: Soft tissue swelling left wrist.  No erythema or warmth.  No fluctuance or induration.  Neurological:     General: No focal deficit present.     Mental Status: She is alert and oriented to person, place, and time.     Cranial Nerves: Cranial nerves are intact.     Sensory: Sensation is intact.     Motor: Motor function is intact.     Gait: Gait is intact.     Comments: 5/5 strength bilateral handgrip. Intact sensation Ambulatory    ED Results / Procedures / Treatments   Labs (all labs ordered are listed, but only abnormal results are displayed) Labs Reviewed - No data to display  EKG None  Radiology DG Wrist Complete Left  Result Date: 07/05/2020 CLINICAL DATA:  Left wrist pain after fall EXAM: LEFT WRIST - COMPLETE 3+ VIEW COMPARISON:  None. FINDINGS: There is no evidence of fracture or dislocation. There is no evidence of arthropathy or other focal bone abnormality. Soft tissues are unremarkable. IMPRESSION: Negative. Electronically Signed   By: Davina Poke D.O.   On: 07/05/2020 12:53   DG Knee Complete 4 Views Right  Result Date: 07/05/2020 CLINICAL DATA:  Right knee pain after fall EXAM: RIGHT KNEE - COMPLETE 4+ VIEW COMPARISON:  07/10/2019 FINDINGS: No evidence of fracture, dislocation, or joint effusion. Mild tricompartmental degenerative changes. Soft tissues  are unremarkable. IMPRESSION: Negative. Electronically Signed   By: Davina Poke D.O.   On: 07/05/2020 12:54   DG Hand Complete Left  Result Date: 07/05/2020 CLINICAL DATA:  Fall onto left hand/wrist. Pain.  Limited range of motion. EXAM: LEFT HAND - COMPLETE 3+ VIEW COMPARISON:  No prior. FINDINGS: An expansile lesion is noted in the middle phalanx of the left third digit. This could represent a primary bone tumor such as an enchondroma. Metastatic disease cannot be excluded. Left hand MRI can be obtained to further evaluate. No evidence of fracture or dislocation. No radiopaque foreign body. IMPRESSION: 1. An expansile lesion is noted in the middle phalanx of the left third digit. This could represent a primary bone tumor such as an enchondroma. Metastatic disease cannot be excluded. Left hand MRI can be obtained to further evaluate. 2.  No acute bony abnormality. Electronically Signed   By: Marcello Moores  Register   On: 07/05/2020 12:54    Procedures .Splint Application  Date/Time: 07/05/2020 1:27 PM Performed by: Nettie Elm, PA-C Authorized by: Nettie Elm, PA-C   Consent:    Consent obtained:  Verbal   Consent given by:  Patient   Risks discussed:  Discoloration, numbness, pain and swelling   Alternatives discussed:  Referral, observation, alternative treatment, delayed treatment and no treatment Pre-procedure details:    Sensation:  Normal Procedure details:    Laterality:  Left   Location:  Wrist   Wrist:  L wrist   Strapping: no     Cast type: Thumb spica.   Splint type:  Thumb spica Post-procedure details:    Pain:  Improved   Sensation:  Normal   Patient tolerance of procedure:  Tolerated well, no immediate complications   (including critical care time)  Medications Ordered in ED Medications  oxyCODONE-acetaminophen (PERCOCET/ROXICET) 5-325 MG per tablet 1 tablet (1 tablet Oral Given 07/05/20 1147)   ED Course  I have reviewed the triage vital signs and the nursing notes.  Pertinent labs & imaging results that were available during my care of the patient were reviewed by me and considered in my medical decision making (see chart for details).  54 year old  presents for evaluation after mechanical fall.  She is anticoagulated however she denies hitting her head, LOC.  He is afebrile, nonseptic, non-ill-appearing.  Patient with complaints to left hand pain, left wrist pain and right knee pain.  She was ambulatory after the fall.  Is neurovascularly intact.  Does have some soft tissue swelling to her left wrist and tenderness to left scaphoid.  No bony tenderness to radius or ulna.  No breaks in skin.  Her tetanus is up-to-date.  Plan on imaging and reassess.  DG right knee without acute fracture, dislocation or effusion DG left hand with possible mass third digit.  Discussed this with patient.  She will follow with orthopedics for MRI DG left wrist without acute fracture, dislocation or effusion.  Patient reassessed.  Neurovascularly intact.  Given tenderness at scaphoid will place in Velcro thumb spica wrist splint.  We will have her follow-up with orthopedics for this as well as MRI to determine possible mass in her metacarpal.  I have low suspicion for acute intracranial, intrathoracic, intra-abdominal injury from her fall.  She is ambulatory in ED without difficulty.  The patient has been appropriately medically screened and/or stabilized in the ED. I have low suspicion for any other emergent medical condition which would require further screening, evaluation or treatment in the ED or require  inpatient management.  Patient is hemodynamically stable and in no acute distress.  Patient able to ambulate in department prior to ED.  Evaluation does not show acute pathology that would require ongoing or additional emergent interventions while in the emergency department or further inpatient treatment.  I have discussed the diagnosis with the patient and answered all questions.  Pain is been managed while in the emergency department and patient has no further complaints prior to discharge.  Patient is comfortable with plan discussed in room and is stable for  discharge at this time.  I have discussed strict return precautions for returning to the emergency department.  Patient was encouraged to follow-up with PCP/specialist refer to at discharge.    MDM Rules/Calculators/A&P                           Final Clinical Impression(s) / ED Diagnoses Final diagnoses:  Fall, initial encounter  Sprain of left wrist, initial encounter  Mass of finger of left hand    Rx / DC Orders ED Discharge Orders         Ordered    HYDROcodone-acetaminophen (NORCO/VICODIN) 5-325 MG tablet  Every 4 hours PRN        07/05/20 1330           Trusten Hume A, PA-C 07/05/20 1331    Fredia Sorrow, MD 07/06/20 [phone]

## 2020-07-05 NOTE — Progress Notes (Signed)
Orthopedic Tech Progress Note Patient Details:  Meghan [name] [birth year] [id number]  Ortho Devices Ortho Device/Splint Location: velcro thumb spica LUE Ortho Device/Splint Interventions: Ordered, Application   Post Interventions Patient Tolerated: Well Instructions Provided: Care of device   Braulio Bosch 07/05/2020, 1:24 PM

## 2020-07-05 NOTE — ED Triage Notes (Signed)
Pt fell this morning, hit her right knee and fell on left hand/wrist. Complains of left wrist/hand pain, right knee pain. No head/neck/back pain or loss of consciousness.

## 2020-07-05 NOTE — Discharge Instructions (Signed)
May take ibuprofen as needed for swelling.  I suggest icing your left wrist.  As discussed in the room it is possible that the x-ray missed a fracture.  I suggest following up with orthopedics for this.  Is also discussed your hand x-ray did show a possible mass in one of your fingers.  You need to follow-up with orthopedics for an MRI to determine what this mass is.  I have written you for a short course of pain medicine.  Please do not drive or operate heavy machinery while taking this medication.  Return for any worsening symptoms.

## 2020-07-11 ENCOUNTER — Ambulatory Visit (INDEPENDENT_AMBULATORY_CARE_PROVIDER_SITE_OTHER): Payer: Medicare Other | Admitting: Licensed Clinical Social Worker

## 2020-07-11 ENCOUNTER — Other Ambulatory Visit: Payer: Self-pay

## 2020-07-11 DIAGNOSIS — F331 Major depressive disorder, recurrent, moderate: Secondary | ICD-10-CM | POA: Diagnosis not present

## 2020-07-11 DIAGNOSIS — F4310 Post-traumatic stress disorder, unspecified: Secondary | ICD-10-CM | POA: Diagnosis not present

## 2020-07-11 NOTE — Progress Notes (Signed)
Virtual Visit via Video Note   I connected with Meghan [name] on 07/11/20 at 3:00pm by video enabled telemedicine application and verified that I am speaking with the correct person using two identifiers.   I discussed the limitations, risks, security and privacy concerns of performing an evaluation and management service by video and the availability of in person appointments. I also discussed with the patient that there may be a patient responsible charge related to this service. The patient expressed understanding and agreed to proceed.   I discussed the assessment and treatment plan with the patient. The patient was provided an opportunity to ask questions and all were answered. The patient agreed with the plan and demonstrated an understanding of the instructions.   The patient was advised to call back or seek an in-person evaluation if the symptoms worsen or if the condition fails to improve as anticipated.   I provided 1 hour of non-face-to-face time during this encounter.     Noralee Stain, LCSW, LCAS _______________________ THERAPIST PROGRESS NOTE   Session Time: 3:00pm - 4:00pm  Location: Patient: Patient Home Provider: [facility] OPT Office    Participation Level: Active    Behavioral Response: Alert, casually dressed, anxious mood/affect   Type of Therapy:  Individual Therapy   Treatment Goals addressed: Anxiety/depression management; medication management and medical appointments   Interventions: CBT   Summary: Meghan [name] is a 54 year old African American female that presented for virtual appointment today with diagnoses of Major Depressive Disorder, recurrent, moderate, and PTSD.      Suicidal/Homicidal: None; without plan or intent    Therapist Response:  Clinician met with [name] for virtual therapy appointment and assessed for safety, sobriety, and medication compliance.  Meghan [name] presented for appointment on time and was alert, oriented x5, with no evidence or self-report of  SI/HI or A/V H.  Meghan [name] reported that she continues taking medication as prescribed and denied any use of alcohol or illicit substances.  Clinician inquired about [name]'s current emotional ratings, as well as any significant changes in thoughts, feelings, or behavior since previous meeting.  Meghan [name] reported scores of 0/10 for depression and 5-6/10 for anxiety, noting no reoccurrence of panic attacks since last check-in.  Clinician inquired about Meghan [name]'s successes and struggles in past week.  Meghan [name] reported that she went to church Saturday for movie night, then slept over at her sister's home later and "Had a good time".  Meghan [name] reported that the next day she also spoke with a church member she was previously not getting along with, and they actually ended up having a positive interaction, with this other individual even asking her to collaborate on a Christmas dance routine.  Meghan [name] reported that she believes establishing a boundary between them for awhile helped heal the relationship and reduce tension overall.  Meghan [name] reported that one significant struggle was falling last Tuesday coming out of the gym, which led to a fractured thumb, so now she has an orthopedic appointment tomorrow, and has to wear a brace.  She reported that she is taking Vicodin from her doctor in the meantime as prescribed to help with pain, which is at a 5-6/10 today.  Clinician empathized with Meghan [name] regarding recent medical event and encouraged her to follow doctor's recommendations regarding use of medication, getting adequate rest and avoiding too much physical exertion so that her thumb has time to heal properly.  Meghan [name] was agreeable to suggestions and noted that she is also anxious due to upcoming surgery on Thursday which  will involve operating on her throat, stating "It has me on edge right now".  Clinician explored this source of worry with Meghan [name], including any cognitive distortions present at this time, influence upon her mood and  outlook, as well as strategies for improving coping skills, such as practice of affirmations and use of support network.  Interventions were effective, as evidenced by Meghan [name] reporting that she is uncomfortable with being put to sleep with anesthesia and tends to worry about not waking up, but would alleviate some of this anxiety before appointment by speaking with her family and peers for support, pray to her higher power, and talk to her doctor about the procedure to put her more at ease.  Meghan [name] stated "I do think god has a plan for me and whatever happens I'll be able to handle it".  Clinician will continue to monitor.                                          Plan: Follow up again in 1 week virtually.    Diagnosis: Major depressive disorder, recurrent, moderate; PTSD    Meghan Flood, LCSW, LCAS 07/11/20

## 2020-07-12 ENCOUNTER — Encounter: Payer: Self-pay | Admitting: Orthopaedic Surgery

## 2020-07-12 ENCOUNTER — Other Ambulatory Visit: Payer: Self-pay

## 2020-07-12 ENCOUNTER — Ambulatory Visit (INDEPENDENT_AMBULATORY_CARE_PROVIDER_SITE_OTHER): Payer: Medicare Other | Admitting: Orthopaedic Surgery

## 2020-07-12 DIAGNOSIS — M79645 Pain in left finger(s): Secondary | ICD-10-CM | POA: Diagnosis not present

## 2020-07-12 NOTE — Progress Notes (Signed)
Office Visit Note   Patient: Meghan [name]           Date of Birth: 1965-10-07           MRN: [id number] Visit Date: 07/12/2020              Requested by: Beverley Fiedler, [place],  [place] [id number] PCP: Beverley Fiedler, FNP   Assessment & Plan: Visit Diagnoses:  1. Pain in left finger(s)          (nonpainful left long middle finger expansile lytic bone lesion  Plan: We discussed patient is likely is a benign lesion could be an enchondroma, aneurysmal bone cyst, possible chondrosarcoma less likely.  She may need an MRI scan referred to hand surgeon so he can make appropriate next steps.  Follow-Up Instructions: No follow-ups on file.   Orders:  No orders of the defined types were placed in this encounter.  No orders of the defined types were placed in this encounter.     Procedures: No procedures performed   Clinical Data: No additional findings.   Subjective: Chief Complaint  Patient presents with  . Right Knee - Pain    Fall 07/05/2020  . Left Hand - Pain    Fall 07/05/2020  . Left Wrist - Pain    Fall 07/05/2020    HPI 54 year old female states she was walking and something flew in her eyes took off her glasses to clean something out of her eye and did not notice that there were 2 steps down instead of one step and fell forward landing on her right knee left hand left wrist.  She states she has had significant pain in the thenar region of her left hand.  She had x-rays obtained of her knee wrist and hand which were all negative except for the hand showed lytic lesion in the middle phalanx of the long(middle) finger.  No evidence of fracture.  Lesion was expansile.  She was placed in thumb spica splint to help protect her hand states her knee is doing better of her hand still is tender over the thenar region.  She has intact sensation in her fingertips states she has not noticed any enlargement of the middle phalanx of the middle  finger.  Review of Systems patient had problems with obesity atrial fib some sleep apnea history of PE and is on Xarelto currently.   Objective: Vital Signs: BP 107/70   Ht 5\' 5"  (1.651 m)   Wt (!) 310 lb (140.6 kg)   BMI 51.59 kg/m   Physical Exam Constitutional:      Appearance: She is well-developed.  HENT:     Head: Normocephalic.     Right Ear: External ear normal.     Left Ear: External ear normal.  Eyes:     Pupils: Pupils are equal, round, and reactive to light.  Neck:     Thyroid: No thyromegaly.     Trachea: No tracheal deviation.  Cardiovascular:     Rate and Rhythm: Normal rate.  Pulmonary:     Effort: Pulmonary effort is normal.  Abdominal:     Palpations: Abdomen is soft.  Skin:    General: Skin is warm and dry.  Neurological:     Mental Status: She is alert and oriented to person, place, and time.  Psychiatric:        Behavior: Behavior normal.     Ortho Exam patient has full flexion extension of her fingertips.  On closer exam there does appear to be slight increased size of middle phalanx of the middle finger.  Two-point sensation the tip is normal she has tenderness over the thenar muscle without hematoma formation.  Thenar hyperthenar compartments are soft.  Specialty Comments:  No specialty comments available.  Imaging: CLINICAL DATA:  Fall onto left hand/wrist. Pain. Limited range of motion.  EXAM: LEFT HAND - COMPLETE 3+ VIEW  COMPARISON:  No prior.  FINDINGS: An expansile lesion is noted in the middle phalanx of the left third digit. This could represent a primary bone tumor such as an enchondroma. Metastatic disease cannot be excluded. Left hand MRI can be obtained to further evaluate. No evidence of fracture or dislocation. No radiopaque foreign body.  IMPRESSION: 1. An expansile lesion is noted in the middle phalanx of the left third digit. This could represent a primary bone tumor such as an enchondroma. Metastatic disease  cannot be excluded. Left hand MRI can be obtained to further evaluate.  2.  No acute bony abnormality.   Electronically Signed   By: Marcello Moores  Register   On: 07/05/2020 12:54   PMFS History: Patient Active Problem List   Diagnosis Date Noted  . Pain in left finger(s) 07/12/2020  . Seizure-like activity ([place]) 03/25/2020  . History of head injury 03/25/2020  . Obstructive sleep apnea syndrome 09/04/2019  . Acute medial meniscus tear of right knee 07/10/2019  . Class 3 severe obesity due to excess calories with serious comorbidity and body mass index (BMI) of 50.0 to 59.9 in adult ([place]) 06/01/2019  . Atrial fibrillation ([place]) 04/17/2019  . History of pulmonary embolus (PE) 04/17/2019  . Type 2 diabetes mellitus, without long-term current use of insulin ([place]) 04/17/2019   Past Medical History:  Diagnosis Date  . Arthritis    left knee  . Asthma   . Atrial fibrillation ([place])   . Atrial fibrillation ([place]) 02/16/2019  . CHF (congestive heart failure) ([place]) 01/2019   Physician from [place] informed her she had this   . Dysrhythmia    Atrial fibrillation  . Headache   . Hiatal hernia   . History of pulmonary embolus ([place])   . Hypercholesterolemia   . Macular degeneration    of left eye  . Osteoarthritis    knees  . Pneumonia    in the past  . Pulmonary embolism ([place])    bilat  . Seizure ([place])   . Seizure disorder ([place]) 2017  . Sleep apnea CPAP   [facility]  . Stroke [facility])    1989 and 1995 (left sided weakness)  . Vitamin D deficiency     Family History  Problem Relation Age of Onset  . Breast cancer Mother   . Breast cancer Maternal Grandmother   . Heart attack Father   . Diabetes Father   . Congestive Heart Failure Father     Past Surgical History:  Procedure Laterality Date  . ABDOMINAL HYSTERECTOMY  2003   complete  . CARDIAC CATHETERIZATION  2017   in [facility] in [place]  . CESAREAN SECTION  [id number]   x 2  . CESAREAN SECTION     x 2  .  CHOLECYSTECTOMY  2003  . COLONOSCOPY WITH PROPOFOL N/A 06/05/2019   Procedure: COLONOSCOPY WITH PROPOFOL;  Surgeon: Carol Ada, MD;  Location: [facility] ENDOSCOPY;  Service: Endoscopy;  Laterality: N/A;  . HERNIA REPAIR  [id number]   umbilical hernia repair  . IVC FILTER INSERTION  2003   Hx  pulmonary embolus  . POLYPECTOMY  06/05/2019   Procedure: POLYPECTOMY;  Surgeon: Carol Ada, MD;  Location: [facility] ENDOSCOPY;  Service: Endoscopy;;  . SHOULDER ARTHROSCOPY W/ ROTATOR CUFF REPAIR  08/28/2017   right shoulder   Social History   Occupational History  . Not on file  Tobacco Use  . Smoking status: Never Smoker  . Smokeless tobacco: Never Used  Vaping Use  . Vaping Use: Never used  Substance and Sexual Activity  . Alcohol use: Never  . Drug use: Never  . Sexual activity: Not on file

## 2020-07-12 NOTE — Addendum Note (Signed)
Addended by: Meyer Cory on: 07/12/2020 04:24 PM   Modules accepted: Orders

## 2020-07-13 ENCOUNTER — Other Ambulatory Visit: Payer: Self-pay | Admitting: Endocrinology

## 2020-07-13 DIAGNOSIS — Z1231 Encounter for screening mammogram for malignant neoplasm of breast: Secondary | ICD-10-CM

## 2020-07-18 ENCOUNTER — Other Ambulatory Visit: Payer: Self-pay

## 2020-07-18 ENCOUNTER — Ambulatory Visit (INDEPENDENT_AMBULATORY_CARE_PROVIDER_SITE_OTHER): Payer: Medicare Other | Admitting: Licensed Clinical Social Worker

## 2020-07-18 DIAGNOSIS — F331 Major depressive disorder, recurrent, moderate: Secondary | ICD-10-CM

## 2020-07-18 DIAGNOSIS — F4310 Post-traumatic stress disorder, unspecified: Secondary | ICD-10-CM

## 2020-07-18 NOTE — Progress Notes (Signed)
Virtual Visit via Video Note   I connected with [name] on 07/18/20 at 9:00am by video enabled telemedicine application and verified that I am speaking with the correct person using two identifiers.   I discussed the limitations, risks, security and privacy concerns of performing an evaluation and management service by video and the availability of in person appointments. I also discussed with the patient that there may be a patient responsible charge related to this service. The patient expressed understanding and agreed to proceed.   I discussed the assessment and treatment plan with the patient. The patient was provided an opportunity to ask questions and all were answered. The patient agreed with the plan and demonstrated an understanding of the instructions.   The patient was advised to call back or seek an in-person evaluation if the symptoms worsen or if the condition fails to improve as anticipated.   I provided 45 minutes of non-face-to-face time during this encounter.     [name], LCSW, LCAS _______________________ THERAPIST PROGRESS NOTE   Session Time: 9:00am - 9:45am   Location: Patient: Patient Home Provider: [place] Office    Participation Level: Active    Behavioral Response: Alert, casually dressed, anxious mood/affect   Type of Therapy:  Individual Therapy   Treatment Goals addressed: Anxiety/depression management; medication management and medical appointments; Maintaining healthy boundaries   Interventions: CBT, grief and loss processing, healthy boundaries   Summary: Meghan [name] is a 54 year old African American female that presented for virtual appointment today with diagnoses of Major Depressive Disorder, recurrent, moderate, and PTSD.      Suicidal/Homicidal: None; without plan or intent    Therapist Response:  Clinician met with [name] for virtual session and assessed for safety, sobriety, and medication compliance.  [name] presented for today's meeting  on time and was alert, oriented x5, with no evidence or self-report of SI/HI or A/V H.  [name] reported ongoing compliance with medication and denied any use of alcohol or illicit substances.  Clinician inquired about [name]'s emotional ratings today, as well as any significant changes in thoughts, feelings, or behavior since last check-in.  [name] reported scores of 2/10 for depression, 10/10 for anxiety, and disclosed that she did have a panic attack last week.  [name] reported that this was after her operation, which went well, and she received a phone call that a friend she knew for 40 years passed away unexpectedly.  Clinician empathized with [name] and inquired about how she is handling this unfortunate news.  [name] reported that she is in shock because she just spoke to this person a few days prior, and now she is trying to emotionally support her goddaughter, who looks to her as a second mother.  Clinician discussed stages of grieving with [name] to help determine where she currently is in processing this loss, and strategies for promoting eventual transition to acceptance stage with appropriate time, such as staying close to supports to avoid isolation, as well as considering engagement in grief and loss virtual groups available each week at [facility].  [name] reported that she is in the denial stage since she has not fully accepted this loss yet and has a pattern of putting on an emotional mask so that she can be strong for others first and foremost before allowing herself time to grieve.  [name] acknowledged that she has stayed close to family, including her sister and church community, who have visited and provided positive company.  [name] was provided space  to recall positive memories of the deceased and the overall impact they had upon her life growing up, which seemed to brighten her mood.  Clinician encouraged [name] to ensure that adequate time is allotted through this week to promote  self-care as well given recent operation, and inquired about her routine for days ahead.  Interventions were effective, as evidenced by [name] reporting that this session was helpful for providing emotional relief and healing, and she would stay close to support system in following days as she continues to reflect on passing of this individual.  [name] reported that she would also plan to keep up with medical appointments, write in her book to avoid compartmentalization, go to church, take naps as needed to recharge, and try to allow her voice to rest occasionally following operation.  Clinician will continue to monitor.                                          Plan: Follow up again in 1 week virtually.    Diagnosis: Major depressive disorder, recurrent, moderate; PTSD    [name], LCSW, LCAS 07/18/20

## 2020-07-19 ENCOUNTER — Ambulatory Visit
Admission: RE | Admit: 2020-07-19 | Discharge: 2020-07-19 | Disposition: A | Discharge status: Home / Self Care | Payer: Medicare Other | Source: Ambulatory Visit | Attending: Endocrinology | Admitting: Endocrinology

## 2020-07-19 ENCOUNTER — Other Ambulatory Visit: Payer: Self-pay

## 2020-07-19 DIAGNOSIS — Z1231 Encounter for screening mammogram for malignant neoplasm of breast: Secondary | ICD-10-CM

## 2020-07-22 ENCOUNTER — Other Ambulatory Visit: Payer: Self-pay | Admitting: Orthopedic Surgery

## 2020-07-22 DIAGNOSIS — S6990XA Unspecified injury of unspecified wrist, hand and finger(s), initial encounter: Secondary | ICD-10-CM

## 2020-07-25 ENCOUNTER — Other Ambulatory Visit: Payer: Self-pay

## 2020-07-25 ENCOUNTER — Ambulatory Visit (HOSPITAL_COMMUNITY): Payer: Medicare Other | Admitting: Licensed Clinical Social Worker

## 2020-07-25 ENCOUNTER — Telehealth (HOSPITAL_COMMUNITY): Payer: Self-pay | Admitting: Licensed Clinical Social Worker

## 2020-07-25 NOTE — Telephone Encounter (Signed)
Atalia had a virtual therapy session scheduled today at 2pm.  Clinician contacted her by phone at 2:07pm when she did not present for appointment on time, but was forced to leave a voicemail reminding her of appointment, and provided contact numbers for clinician and outpatient office.  Clinician ended virtual session at 2:15pm when Hadley had not returned call or presented for meeting, and informed front desk of no-show event.    [name], [place], LCAS 07/25/20

## 2020-08-01 ENCOUNTER — Other Ambulatory Visit: Payer: Self-pay

## 2020-08-01 ENCOUNTER — Ambulatory Visit (INDEPENDENT_AMBULATORY_CARE_PROVIDER_SITE_OTHER): Payer: Medicare Other | Admitting: Licensed Clinical Social Worker

## 2020-08-01 DIAGNOSIS — F4310 Post-traumatic stress disorder, unspecified: Secondary | ICD-10-CM

## 2020-08-01 DIAGNOSIS — F331 Major depressive disorder, recurrent, moderate: Secondary | ICD-10-CM | POA: Diagnosis not present

## 2020-08-01 NOTE — Progress Notes (Signed)
Virtual Visit via Video Note   I connected with [name] on 08/01/20 at 2:00pm by video enabled telemedicine application and verified that I am speaking with the correct person using two identifiers.   I discussed the limitations, risks, security and privacy concerns of performing an evaluation and management service by video and the availability of in person appointments. I also discussed with the patient that there may be a patient responsible charge related to this service. The patient expressed understanding and agreed to proceed.   I discussed the assessment and treatment plan with the patient. The patient was provided an opportunity to ask questions and all were answered. The patient agreed with the plan and demonstrated an understanding of the instructions.   The patient was advised to call back or seek an in-person evaluation if the symptoms worsen or if the condition fails to improve as anticipated.   I provided 1 hour of non-face-to-face time during this encounter.     [name], LCSW, LCAS _______________________ THERAPIST PROGRESS NOTE   Session Time: 2:00pm - 3:00pm  Location: Patient: Patient Home Provider: [place] Office    Participation Level: Active    Behavioral Response: Alert, casually dressed, anxious mood/affect   Type of Therapy:  Individual Therapy   Treatment Goals addressed: Anxiety/depression management; medication management and medical appointments; Maintaining healthy boundaries   Interventions: CBT, grief and loss processing   Summary: Luisana Lutzke is a 54 year old African American female that presented for virtual appointment today with diagnoses of Major Depressive Disorder, recurrent, moderate, and PTSD.      Suicidal/Homicidal: None; without plan or intent    Therapist Response:  Clinician met with Zyasia for virtual therapy appointment and assessed for safety, sobriety, and medication compliance.  Ariane presented for today's session on time and  was alert, oriented x5, with no evidence or self-report of SI/HI or A/V H.  Mistie reported that she continues taking medication as prescribed and denied any use of alcohol or illicit substances.  Clinician inquired about Kimiye's current emotional ratings, as well as any significant changes in thoughts, feelings, or behavior since previous check-in.  Tamryn reported scores of 0/10 for depression, and 10/10 for anxiety, and denied any panic attacks.  Darlean reported that her liver biopsy went well, and she does not need further surgery at this time, which has alleviated some stress.  She reported that her biopsy appointment being moved around unexpectedly was what led her to miss previous therapy session.  Jullisa reported that her biggest struggle now is preparing for the funeral of her friend, which she will travel to attend Friday, and is nervous about, since she has to sing and share a personal poem.  Dhara reported that she has held a pattern of suppressing difficult feelings for years, and at funerals she has the urge to cry, but "Wants to be strong for everyone else".  Clinician discussed 5 stages of grief model with Breonia today (I.e. denial, anger, bargaining, depression, and acceptance), along with common emotional/behavioral changes observed during transitions in order to help Silver identify where she is in process, and strategies for promoting healthy grieving.  Alley reported that she is still in shock at this time, and hasn't been allowing herself to cry or focus on the loss despite recurrent urge, so she has remained in stage of denial, and worries about breaking down in front of family when singing or reciting the poem at upcoming viewing.  Clinician stressed importance of avoiding reliance upon  long-term emotional suppression and consequences that can result from this pattern, including physical symptoms (I.e. high blood pressure, heart disease, reduce immune system function) and mental impact (I.e.  worsening depression, stress, isolation, etc).  Clinician also provided suggestions on how to cope during this time to work towards acceptance stage of loss, including engaging in spiritual practice/attending church, engaging in self-care/avoiding too much stress, as well as staying close to positive supports in network and honestly opening up about emotions.  Interventions were effective, as evidenced by [name] reporting that she felt better having discussed her feelings today in session, and acknowledged that she would benefit from being more honest with supports about her grief since she tends to feel better after allowing herself to share whats on her mind and cry when necessary.  She reported that she would split time between self-care such as prayer, practicing her singing, and writing poem over next days to keep schedule maneagable. Clinician will continue to monitor.                                          Plan: Follow up again in 1 week virtually.    Diagnosis: Major depressive disorder, recurrent, moderate; PTSD    [name], LCSW, LCAS 08/01/20

## 2020-08-08 ENCOUNTER — Ambulatory Visit (INDEPENDENT_AMBULATORY_CARE_PROVIDER_SITE_OTHER): Payer: Medicare Other | Admitting: Licensed Clinical Social Worker

## 2020-08-08 ENCOUNTER — Other Ambulatory Visit: Payer: Self-pay

## 2020-08-08 DIAGNOSIS — F331 Major depressive disorder, recurrent, moderate: Secondary | ICD-10-CM

## 2020-08-08 DIAGNOSIS — F4310 Post-traumatic stress disorder, unspecified: Secondary | ICD-10-CM | POA: Diagnosis not present

## 2020-08-08 NOTE — Progress Notes (Signed)
Virtual Visit via Video Note   I connected with Meghan [name] on 08/08/20 at 2:00pm by video enabled telemedicine application and verified that I am speaking with the correct person using two identifiers.   I discussed the limitations, risks, security and privacy concerns of performing an evaluation and management service by video and the availability of in person appointments. I also discussed with the patient that there may be a patient responsible charge related to this service. The patient expressed understanding and agreed to proceed.   I discussed the assessment and treatment plan with the patient. The patient was provided an opportunity to ask questions and all were answered. The patient agreed with the plan and demonstrated an understanding of the instructions.   The patient was advised to call back or seek an in-person evaluation if the symptoms worsen or if the condition fails to improve as anticipated.   I provided 1 hour of non-face-to-face time during this encounter.     [name], LCSW, LCAS _______________________ THERAPIST PROGRESS NOTE   Session Time: 2:00pm - 3:00pm  Location: Patient: Patient Home Provider: [place] [facility]    Participation Level: Active    Behavioral Response: Alert, casually dressed, anxious mood/affect   Type of Therapy:  Individual Therapy   Treatment Goals addressed: Anxiety/depression management; medication management; Maintaining healthy boundaries    Interventions: CBT, grief and loss processing, referrals for expanding support network   Summary: Meghan [name] is a 54 year old African American female that presented for virtual appointment today with diagnoses of Major Depressive Disorder, recurrent, moderate, and PTSD.      Suicidal/Homicidal: None; without plan or intent    Therapist Response:  Clinician met with Meghan [name] for virtual therapy session and assessed for safety, sobriety, and medication compliance.  Meghan [name] presented for today's  appointment on time and was alert, oriented x5, with no evidence or self-report of SI/HI or A/V H.  Meghan [name] reported that ongoing compliance with medication and denied any use of alcohol or illicit substances.  Clinician inquired about Meghan [name]'s emotional ratings today, as well as any significant changes in thoughts, feelings, or behavior since last check-in.  Meghan [name] reported scores of 5/10 for depression, and 10/10 for both anxiety and irritability, but denied any reoccurrence of panic attacks.  Meghan [name] reported that this past week has been extremely stressful, as she did end up going out of state for friend's funeral, but a series of events which occurred during this trip worsened her anxiety, and now she feels like isolating, stating "I just don't want to be bothered lately".  Clinician assisted Meghan [name] in unloading the recent stressors and she wept for the first time since learning of her friends passing, stating "People don't understand that feeling when someone close to you has passed, its like a gut punch".  Meghan [name] reported that she has tried to confide in family and friends, but they shut her down, going as far as telling her "You gotta do better".  Clinician empathized with Meghan [name] and validated her feelings following this loss, revisiting stages of grief and importance of being able to open up with supportive peers to facilitate healthy grieving process.  Clinician encouraged Meghan [name] to be mindful of boundaries with individuals that worsen her mood, and provided her with referral to [facility] virtual/in-person support groups, discussing some of benefits they could offer her at this time, including safe space to interact with peers going through similar challenges to herself without fear of judgement.  Interventions were effective, as  evidenced by Meghan [name] reporting that several of the groups mentioned were interesting to her, such as [facility], and [employer] [facility], stating "I'll look into it after  our meeting today and feel like it could make a difference".  Clinician will continue to monitor.                                          Plan: Follow up again in 1 week virtually.    Diagnosis: Major depressive disorder, recurrent, moderate; PTSD    [name], LCSW, LCAS 08/08/20

## 2020-08-17 ENCOUNTER — Ambulatory Visit (INDEPENDENT_AMBULATORY_CARE_PROVIDER_SITE_OTHER): Payer: Medicare Other | Admitting: Licensed Clinical Social Worker

## 2020-08-17 ENCOUNTER — Other Ambulatory Visit: Payer: Self-pay

## 2020-08-17 DIAGNOSIS — F4310 Post-traumatic stress disorder, unspecified: Secondary | ICD-10-CM

## 2020-08-17 DIAGNOSIS — F331 Major depressive disorder, recurrent, moderate: Secondary | ICD-10-CM

## 2020-08-17 NOTE — Progress Notes (Signed)
Virtual Visit via Video Note   I connected with Meghan [name] on 08/17/20 at 2:00pm by video enabled telemedicine application and verified that I am speaking with the correct person using two identifiers.   I discussed the limitations, risks, security and privacy concerns of performing an evaluation and management service by video and the availability of in person appointments. I also discussed with the patient that there may be a patient responsible charge related to this service. The patient expressed understanding and agreed to proceed.   I discussed the assessment and treatment plan with the patient. The patient was provided an opportunity to ask questions and all were answered. The patient agreed with the plan and demonstrated an understanding of the instructions.   The patient was advised to call back or seek an in-person evaluation if the symptoms worsen or if the condition fails to improve as anticipated.   I provided 45 minutes of non-face-to-face time during this encounter.     Meghan Flood, LCSW, LCAS _______________________ THERAPIST PROGRESS NOTE   Session Time: 2:00pm - 2:45pm  Location: Patient: Patient Home Provider: [place] [facility]    Participation Level: Active    Behavioral Response: Alert, casually dressed, euthymic mood/affect   Type of Therapy:  Individual Therapy   Treatment Goals addressed: Anxiety/depression management; medication management; Maintaining healthy boundaries; Expanding support    Interventions: CBT, challenging cognitive distortions   Summary: Meghan [name] is a 54 year old African American female that presented for virtual appointment today with diagnoses of Major Depressive Disorder, recurrent, moderate, and PTSD.      Suicidal/Homicidal: None; without plan or intent    Therapist Response:  Clinician met with Meghan [name] for virtual therapy appointment and assessed for safety, sobriety, and medication compliance.  Meghan [name] presented for today's session on  time and was alert, oriented x5, with no evidence or self-report of SI/HI or A/V H.  Meghan [name] reported that she has continued taking medication as prescribed and denied any use of alcohol or illicit substances.  Clinician inquired about Meghan [name] current emotional ratings, as well as any significant changes in thoughts, feelings, or behavior since previous check-in.  Meghan [name] reported scores of 0/10 for depression, anxiety and irritability, and denied experiencing any panic attacks.  Clinician inquired about Meghan [name]'s struggles and successes over past week.  Meghan [name] reported that she is overall in a good mood today and this was partly due to her daughter and grandson visiting unexpectedly over the weekend, with her sister accompanying them and helping cook a meal together.  She stated "It was a good surprise and we had a nice time".  She also reported that she has signed up for virtual community support groups through [facility], but has not been able to attend yet.  Meghan [name] reported that one challenge has been continuing to grieve the loss of her friend, as Monday was the anniversary of them first meeting, so she isolated, cried, and 'laid around' all day.  Meghan [name] reported that she had put her phone on 'do not disturb', so her sister, and two church friends were alarmed when they could not reach her, leading them to come over and visit out of concern.  Meghan [name] reported that this led them to talk about recent communication issues that had arisen, and reach a greater point of mutual understanding, stating "It was all in my head, I was beating myself up for no reason".  Clinician discussed 'mind reading' cognitive distortion with Meghan [name] in relation to this misunderstanding between her supports, and how  this involves the assumption that we know what someone is thinking and assume the worst possible impression.  Clinician also discussed strategies for challenging this distortion in order to break pattern of distorted thinking, such as weighing  evidence to support or dispute the distortion, and talking with an unbiased support to determine if the situation is being viewed accurately.  Interventions were effective, as evidenced by Meghan [name] reporting that she will make greater effort to abstain from engaging in mind reading since this pattern usually leads to emotional pain, isolation, and she is rarely correct about her analysis of the situation, stating "I just assume, I don't really think it through, so 9 times out of 10 I get it wrong and I need to stop overthinking it".  Clinician will continue to monitor.                                          Plan: Follow up again in 1 week virtually.    Diagnosis: Major depressive disorder, recurrent, moderate; PTSD    Meghan Flood, LCSW, LCAS 08/17/20

## 2020-08-19 ENCOUNTER — Other Ambulatory Visit: Payer: Self-pay

## 2020-08-19 ENCOUNTER — Ambulatory Visit
Admission: RE | Admit: 2020-08-19 | Discharge: 2020-08-19 | Disposition: A | Discharge status: Home / Self Care | Payer: Medicare Other | Source: Ambulatory Visit | Attending: Orthopedic Surgery | Admitting: Orthopedic Surgery

## 2020-08-19 DIAGNOSIS — S6990XA Unspecified injury of unspecified wrist, hand and finger(s), initial encounter: Secondary | ICD-10-CM

## 2020-08-19 MED ORDER — IOPAMIDOL (ISOVUE-M 200) INJECTION 41%
2.0000 mL | Freq: Once | INTRAMUSCULAR | Status: AC
  Administered 2020-08-19: 2 mL via INTRA_ARTICULAR

## 2020-08-22 ENCOUNTER — Ambulatory Visit (INDEPENDENT_AMBULATORY_CARE_PROVIDER_SITE_OTHER): Payer: Medicare Other | Admitting: Licensed Clinical Social Worker

## 2020-08-22 ENCOUNTER — Other Ambulatory Visit: Payer: Self-pay

## 2020-08-22 DIAGNOSIS — F4310 Post-traumatic stress disorder, unspecified: Secondary | ICD-10-CM | POA: Diagnosis not present

## 2020-08-22 DIAGNOSIS — F331 Major depressive disorder, recurrent, moderate: Secondary | ICD-10-CM | POA: Diagnosis not present

## 2020-08-22 NOTE — Progress Notes (Signed)
Virtual Visit via Video Note   I connected with Meghan [name] on 08/22/20 at 2:00pm by video enabled telemedicine application and verified that I am speaking with the correct person using two identifiers.   I discussed the limitations, risks, security and privacy concerns of performing an evaluation and management service by video and the availability of in person appointments. I also discussed with the patient that there may be a patient responsible charge related to this service. The patient expressed understanding and agreed to proceed.   I discussed the assessment and treatment plan with the patient. The patient was provided an opportunity to ask questions and all were answered. The patient agreed with the plan and demonstrated an understanding of the instructions.   The patient was advised to call back or seek an in-person evaluation if the symptoms worsen or if the condition fails to improve as anticipated.   I provided 1 hour of non-face-to-face time during this encounter.     Meghan Flood, LCSW, LCAS _______________________ THERAPIST PROGRESS NOTE   Session Time: 2:00pm - 3:00pm  Location: Patient: Patient Home Provider: [place] [facility]    Participation Level: Active    Behavioral Response: Alert, casually dressed, euthymic mood/affect   Type of Therapy:  Individual Therapy   Treatment Goals addressed: Anxiety/depression management; medication management; Attending medical appointments; Maintaining healthy boundaries and utilizing support system    Interventions: CBT, grief and loss    Summary: Meghan [name] is a 54 year old African American female that presented for virtual appointment today with diagnoses of Major Depressive Disorder, recurrent, moderate, and PTSD.      Suicidal/Homicidal: None; without plan or intent    Therapist Response:  Clinician met with Meghan [name] for virtual therapy session and assessed for safety, sobriety, and medication compliance.  Meghan [name] presented for  today's appointment on time and was alert, oriented x5, with no evidence or self-report of SI/HI or A/V H.  Meghan [name] reported ongoing compliance with medication and denied any use of alcohol or illicit substances.  Clinician inquired about Meghan [name]'s emotional ratings today, as well as any significant changes in thoughts, feelings, or behavior since last check-in.  Meghan [name] reported scores of 0/10 for depression, anxiety and irritability, and denied experiencing any panic attacks, which is consistent with previous check-in.  Clinician inquired about Meghan [name]'s successes and struggles over last week.  Meghan [name] reported that things have been good for her, and she completed medical procedure this morning at 6am, which went well according to her doctor.  Meghan [name] reported that she has two more appointments this week, and continues having less anxiety with each one, as she is trying not to worry, and utilize support system appropriately.  She reported that one struggle was realizing that the anniversary of her mother's passing is next week on the 19th, which is just before her birthday, so she doesn't know how she will handle this, as it tends to be an emotionally taxing experience.  Clinician normalized Meghan [name]'s experience with return of grieving cycle, including likelihood of triggering reminders to resurface, standard emotional reactions (I.e. anger, anxiety, crying spells, fatigue, guilt, etc), and tips for coping with anniversary date in order to promote healthy healing and acceptance, such as planning distractions ahead of anniversary, reminiscing about positive memories with support system, starting new traditions to celebrate the deceased, engaging with community support via church involvement, and allowing oneself to experience full range of emotions as they arise instead of trying to bottle them up.  Meghan [name] reported that she had  some difficult memories in her childhood involving her mother, and would benefit from thinking of the  good years when they reconnected prior to her passing.  Meghan [name] reported that she will be visiting family out of state to celebrate her birthday that same week, so she will stay around family and supports, allow herself time to cry, and seek spiritual support from church community as well.  Interventions were effective, as evidenced by Meghan [name] stating "It was helpful to talk about a very difficult day coming up and how to handle it differently so it hopefully won't be as bad as its been in the past".  Clinician will continue to monitor.                                          Plan: Follow up again in 1 week virtually.    Diagnosis: Major depressive disorder, recurrent, moderate; PTSD    Meghan Flood, LCSW, LCAS 08/22/20

## 2020-08-29 ENCOUNTER — Other Ambulatory Visit: Payer: Self-pay

## 2020-08-29 ENCOUNTER — Ambulatory Visit (INDEPENDENT_AMBULATORY_CARE_PROVIDER_SITE_OTHER): Payer: Medicare Other | Admitting: Licensed Clinical Social Worker

## 2020-08-29 DIAGNOSIS — F4310 Post-traumatic stress disorder, unspecified: Secondary | ICD-10-CM

## 2020-08-29 DIAGNOSIS — F331 Major depressive disorder, recurrent, moderate: Secondary | ICD-10-CM | POA: Diagnosis not present

## 2020-08-29 NOTE — Progress Notes (Signed)
Virtual Visit via Video Note   I connected with Meghan [name] on 08/29/20 at 2:00pm by video enabled telemedicine application and verified that I am speaking with the correct person using two identifiers.   I discussed the limitations, risks, security and privacy concerns of performing an evaluation and management service by video and the availability of in person appointments. I also discussed with the patient that there may be a patient responsible charge related to this service. The patient expressed understanding and agreed to proceed.   I discussed the assessment and treatment plan with the patient. The patient was provided an opportunity to ask questions and all were answered. The patient agreed with the plan and demonstrated an understanding of the instructions.   The patient was advised to call back or seek an in-person evaluation if the symptoms worsen or if the condition fails to improve as anticipated.   I provided 1 hour of non-face-to-face time during this encounter.     [name], LCSW, LCAS _______________________ THERAPIST PROGRESS NOTE   Session Time: 2:00pm - 3:00pm  Location: Patient: Patient Home Provider: [place] [facility]    Participation Level: Active    Behavioral Response: Alert, casually dressed, anxious mood/affect   Type of Therapy:  Individual Therapy   Treatment Goals addressed: Anxiety/depression management; medication management; Attending medical appointments; Finishing novel ahead of deadline; [place] attendance    Interventions: CBT, problem solving    Summary: Meghan [name] is a 54 year old African American female that presented for virtual appointment today with diagnoses of Major Depressive Disorder, recurrent, moderate, and PTSD.      Suicidal/Homicidal: None; without plan or intent    Therapist Response:  Clinician met with Meghan [name] for virtual therapy appointment and assessed for safety, sobriety, and medication compliance.  Meghan [name] presented for today's  session on time and was alert, oriented x5, with no evidence or self-report of SI/HI or A/V H.  Meghan [name] reported that Meghan [name] continues taking medication as prescribed and denied any use of alcohol or illicit substances.  Clinician inquired about Meghan [name]'s current emotional ratings, as well as any significant changes in thoughts, feelings, or behavior since previous check-in.  Meghan [name] reported scores of 0/10 for depression, 10/10 for anxiety and 4/10 for pain.  Meghan [name] denied any reoccurrence of panic attacks.  Clinician inquired about Meghan [name]'s struggles and successes over past week.  Meghan [name] reported that Meghan [name] attended two virtual support meetings over the past week and stated "They made me feel comfortable and welcome.  I definitely plan on joining again".  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  Meghan [name] reported that her struggle is dealing with a knee injury that likely occurred Saturday when engaging in dance practice at church.  Meghan [name] reported that Meghan [name] also has history of arthritis which contributed to pain level of 10/10 in severity yesterday, along with anxiety regarding concern that Meghan [name] won't be able to dance on Sunday for church as originally planned.  Clinician empathized with Meghan [name] and inquired about what strategies Meghan [name] utilized in the past to manage pain effectively, in addition to providing additional suggestions for keeping arthritis manageable, such as following up with medical professionals for guidance, stretching before any exercise, applying heat and cooling to aching joints as needed to reduce inflammation/pain, modifying diet with supervision from nutritionist to reduce inflammatory foods, scheduling massage/acupuncture appointments, getting adequate rest each night, and more.  Interventions were effective,  as evidenced by Meghan [name] reporting that Meghan [name] has tried to take hot baths and rest when Meghan [name] pushes herself too far, in  addition to stretching often, but Meghan [name] will also plan to implement some of the other suggestions, and stated "I already talked to my doctor and agreed to go to the hospital if it doesn't get better.  I'm going to rest today after we finish therapy because I'm already tired".  Clinician will continue to monitor.                                          Plan: Follow up again in 1 week virtually.    Diagnosis: Major depressive disorder, recurrent, moderate; PTSD    [name], LCSW, LCAS 08/29/20

## 2020-09-05 ENCOUNTER — Ambulatory Visit (INDEPENDENT_AMBULATORY_CARE_PROVIDER_SITE_OTHER): Payer: Medicare Other | Admitting: Licensed Clinical Social Worker

## 2020-09-05 ENCOUNTER — Other Ambulatory Visit: Payer: Self-pay

## 2020-09-05 DIAGNOSIS — F331 Major depressive disorder, recurrent, moderate: Secondary | ICD-10-CM | POA: Diagnosis not present

## 2020-09-05 DIAGNOSIS — F4310 Post-traumatic stress disorder, unspecified: Secondary | ICD-10-CM

## 2020-09-05 DIAGNOSIS — F411 Generalized anxiety disorder: Secondary | ICD-10-CM
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
## 2020-09-13 ENCOUNTER — Ambulatory Visit (HOSPITAL_COMMUNITY): Payer: Medicare Other | Admitting: Licensed Clinical Social Worker

## 2020-09-20 ENCOUNTER — Ambulatory Visit (INDEPENDENT_AMBULATORY_CARE_PROVIDER_SITE_OTHER): Payer: Medicare Other | Admitting: Licensed Clinical Social Worker

## 2020-09-20 ENCOUNTER — Other Ambulatory Visit: Payer: Self-pay

## 2020-09-20 DIAGNOSIS — F411 Generalized anxiety disorder: Secondary | ICD-10-CM

## 2020-09-20 DIAGNOSIS — F331 Major depressive disorder, recurrent, moderate: Secondary | ICD-10-CM

## 2020-09-20 DIAGNOSIS — F4310 Post-traumatic stress disorder, unspecified: Secondary | ICD-10-CM | POA: Diagnosis not present
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
## 2020-09-26 ENCOUNTER — Ambulatory Visit (INDEPENDENT_AMBULATORY_CARE_PROVIDER_SITE_OTHER): Payer: Medicare Other | Admitting: Licensed Clinical Social Worker

## 2020-09-26 ENCOUNTER — Other Ambulatory Visit: Payer: Self-pay

## 2020-09-26 DIAGNOSIS — F411 Generalized anxiety disorder: Secondary | ICD-10-CM | POA: Diagnosis not present

## 2020-09-26 DIAGNOSIS — F331 Major depressive disorder, recurrent, moderate: Secondary | ICD-10-CM | POA: Diagnosis not present

## 2020-09-26 DIAGNOSIS — F4310 Post-traumatic stress disorder, unspecified: Secondary | ICD-10-CM | POA: Diagnosis not present

## 2020-09-26 NOTE — Progress Notes (Signed)
Virtual Visit via Video Note   I connected with Meghan [name] on 09/26/20 at 2:00pm by video enabled telemedicine application and verified that I am speaking with the correct person using two identifiers.   I discussed the limitations, risks, security and privacy concerns of performing an evaluation and management service by video and the availability of in person appointments. I also discussed with the patient that there may be a patient responsible charge related to this service. The patient expressed understanding and agreed to proceed.   I discussed the assessment and treatment plan with the patient. The patient was provided an opportunity to ask questions and all were answered. The patient agreed with the plan and demonstrated an understanding of the instructions.   The patient was advised to call back or seek an in-person evaluation if the symptoms worsen or if the condition fails to improve as anticipated.   I provided 30 minutes of non-face-to-face time during this encounter.     [name], LCSW, LCAS _______________________ THERAPIST PROGRESS NOTE   Session Time: 2:00pm - 2:30pm  Location: Patient: Patient Home Provider: [place]    Participation Level: Active    Behavioral Response: Alert, casually dressed, euthymic mood/affect   Type of Therapy:  Individual Therapy   Treatment Goals addressed: Anxiety/depression management; medication management; Keeping up with medical appointments to maintain physical health; [facility] attendance; Novel completion    Interventions: CBT, problem solving    Summary: Meghan [name] is a 55 year old African American female that presented for virtual appointment today with diagnoses of Major Depressive Disorder, recurrent, moderate, Generalized Anxiety Disorder and PTSD.      Suicidal/Homicidal: None; without plan or intent    Therapist Response:  Clinician met with Meghan [name] for virtual therapy appointment and assessed for safety, sobriety, and  medication compliance.  Meghan [name] presented for today's session on time and was alert, oriented x5, with no evidence or self-report of SI/HI or A/V H.  Meghan [name] reported that she continues taking medication as prescribed and denied any use of alcohol or illicit substances.  Clinician inquired about Meghan [name]'s current emotional ratings, as well as any significant changes in thoughts, feelings, or behavior since previous check-in.  Meghan [name] reported scores of 0/10 for both depression and anxiety but noted some irritability over the past day.  She denied any panic attacks.  Clinician inquired about Meghan [name]'s successes and struggles over past week.  Meghan [name] reported that she has been working diligently upon her novel since returning home, and regularly attending virtual support groups through [employer].  She reported that she has several doctor's appointments this week, but is not anxious about any of them, as she avoids ruminating on 'what if' situations as frequently as she used to.  Meghan [name] reported that her irritability from the day prior was related to a member of her church that can be overbearing and has to always be involved in program management, which can frustrate her and has led her to withdraw before from these activities to avoid having to deal with her.  Clinician praised Meghan [name] for keeping healthy boundaries in mind in regard to external triggers, and explored strategies with her for addressing ongoing interpersonal issues within her church network, including assertively approaching this individual to voice concerns and increase mutual understanding, seeking mediation from church staff in order to ensure appropriate supervision of the programs for fairness, or maintaining rigid boundaries if necessary to protect her own wellness.  Meghan [name] reported that she will continue avoiding this person  since they can be 'two faced' and a direct approach has not helped in the past, as this person will say one thing and do  something else in turn.  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  Meghan [name] stated "Whenever things get tough at church I have a few people I can talk to that I know have my back".  Meghan [name] denied having any other issues to address today, so session was ended following this discussion so that she wouldn't miss transportation to doctor's appointment within the hour.  Clinician will continue to monitor.                                          Plan: Follow up again in 1 week virtually.    Diagnosis: Major depressive disorder, recurrent, moderate; Generalized Anxiety Disorder; PTSD    [name], LCSW, LCAS 09/26/20

## 2020-09-27 ENCOUNTER — Other Ambulatory Visit: Payer: Self-pay | Admitting: Orthopedic Surgery

## 2020-09-29 ENCOUNTER — Other Ambulatory Visit: Payer: Self-pay

## 2020-09-29 ENCOUNTER — Emergency Department (HOSPITAL_COMMUNITY): Payer: Medicare Other

## 2020-09-29 ENCOUNTER — Observation Stay (HOSPITAL_COMMUNITY)
Admission: EM | Admit: 2020-09-29 | Discharge: 2020-09-30 | Disposition: A | Discharge status: Home / Self Care | Payer: Medicare Other | Attending: Internal Medicine | Admitting: Internal Medicine

## 2020-09-29 DIAGNOSIS — I4891 Unspecified atrial fibrillation: Secondary | ICD-10-CM | POA: Diagnosis present

## 2020-09-29 DIAGNOSIS — Z7984 Long term (current) use of oral hypoglycemic drugs: Secondary | ICD-10-CM | POA: Insufficient documentation

## 2020-09-29 DIAGNOSIS — R531 Weakness: Principal | ICD-10-CM

## 2020-09-29 DIAGNOSIS — Z7901 Long term (current) use of anticoagulants: Secondary | ICD-10-CM | POA: Diagnosis not present

## 2020-09-29 DIAGNOSIS — Z833 Family history of diabetes mellitus: Secondary | ICD-10-CM | POA: Insufficient documentation

## 2020-09-29 DIAGNOSIS — R700 Elevated erythrocyte sedimentation rate: Secondary | ICD-10-CM | POA: Diagnosis not present

## 2020-09-29 DIAGNOSIS — G4733 Obstructive sleep apnea (adult) (pediatric): Secondary | ICD-10-CM | POA: Diagnosis present

## 2020-09-29 DIAGNOSIS — Z8673 Personal history of transient ischemic attack (TIA), and cerebral infarction without residual deficits: Secondary | ICD-10-CM | POA: Insufficient documentation

## 2020-09-29 DIAGNOSIS — R29898 Other symptoms and signs involving the musculoskeletal system: Secondary | ICD-10-CM

## 2020-09-29 DIAGNOSIS — E119 Type 2 diabetes mellitus without complications: Secondary | ICD-10-CM | POA: Diagnosis not present

## 2020-09-29 DIAGNOSIS — Z79899 Other long term (current) drug therapy: Secondary | ICD-10-CM | POA: Diagnosis not present

## 2020-09-29 DIAGNOSIS — R110 Nausea: Secondary | ICD-10-CM | POA: Diagnosis not present

## 2020-09-29 DIAGNOSIS — Z86711 Personal history of pulmonary embolism: Secondary | ICD-10-CM | POA: Diagnosis not present

## 2020-09-29 DIAGNOSIS — Z8249 Family history of ischemic heart disease and other diseases of the circulatory system: Secondary | ICD-10-CM | POA: Insufficient documentation

## 2020-09-29 DIAGNOSIS — R7303 Prediabetes: Secondary | ICD-10-CM

## 2020-09-29 DIAGNOSIS — R519 Headache, unspecified: Secondary | ICD-10-CM | POA: Diagnosis not present

## 2020-09-29 DIAGNOSIS — I509 Heart failure, unspecified: Secondary | ICD-10-CM | POA: Diagnosis not present

## 2020-09-29 DIAGNOSIS — Z20822 Contact with and (suspected) exposure to covid-19: Secondary | ICD-10-CM | POA: Insufficient documentation

## 2020-09-29 DIAGNOSIS — Z888 Allergy status to other drugs, medicaments and biological substances status: Secondary | ICD-10-CM | POA: Diagnosis not present

## 2020-09-29 DIAGNOSIS — R079 Chest pain, unspecified: Secondary | ICD-10-CM | POA: Insufficient documentation

## 2020-09-29 DIAGNOSIS — I480 Paroxysmal atrial fibrillation: Secondary | ICD-10-CM | POA: Diagnosis not present

## 2020-09-29 DIAGNOSIS — J45909 Unspecified asthma, uncomplicated: Secondary | ICD-10-CM

## 2020-09-29 DIAGNOSIS — Z7951 Long term (current) use of inhaled steroids: Secondary | ICD-10-CM | POA: Diagnosis not present

## 2020-09-29 LAB — DIFFERENTIAL
Abs Immature Granulocytes: 0.03 10*3/uL (ref 0.00–0.07)
Basophils Absolute: 0 10*3/uL (ref 0.0–0.1)
Basophils Relative: 0 %
Eosinophils Absolute: 0.1 10*3/uL (ref 0.0–0.5)
Eosinophils Relative: 2 %
Immature Granulocytes: 1 %
Lymphocytes Relative: 29 %
Lymphs Abs: 1.8 10*3/uL (ref 0.7–4.0)
Monocytes Absolute: 0.5 10*3/uL (ref 0.1–1.0)
Monocytes Relative: 8 %
Neutro Abs: 3.7 10*3/uL (ref 1.7–7.7)
Neutrophils Relative %: 60 %

## 2020-09-29 LAB — URINALYSIS, ROUTINE W REFLEX MICROSCOPIC
Bilirubin Urine: NEGATIVE
Glucose, UA: NEGATIVE mg/dL
Hgb urine dipstick: NEGATIVE
Ketones, ur: NEGATIVE mg/dL
Leukocytes,Ua: NEGATIVE
Nitrite: NEGATIVE
Protein, ur: NEGATIVE mg/dL
Specific Gravity, Urine: 1.006 (ref 1.005–1.030)
pH: 5 (ref 5.0–8.0)

## 2020-09-29 LAB — I-STAT CHEM 8, ED
BUN: 15 mg/dL (ref 6–20)
Calcium, Ion: 1.29 mmol/L (ref 1.15–1.40)
Chloride: 105 mmol/L (ref 98–111)
Creatinine, Ser: 0.9 mg/dL (ref 0.44–1.00)
Glucose, Bld: 96 mg/dL (ref 70–99)
HCT: 33 % — ABNORMAL LOW (ref 36.0–46.0)
Hemoglobin: 11.2 g/dL — ABNORMAL LOW (ref 12.0–15.0)
Potassium: 3.8 mmol/L (ref 3.5–5.1)
Sodium: 142 mmol/L (ref 135–145)
TCO2: 27 mmol/L (ref 22–32)

## 2020-09-29 LAB — TROPONIN I (HIGH SENSITIVITY)
Troponin I (High Sensitivity): 3 ng/L (ref <18)
Troponin I (High Sensitivity): 4 ng/L (ref <18)

## 2020-09-29 LAB — CBC
HCT: 35.4 % — ABNORMAL LOW (ref 36.0–46.0)
Hemoglobin: 10.7 g/dL — ABNORMAL LOW (ref 12.0–15.0)
MCH: 25.2 pg — ABNORMAL LOW (ref 26.0–34.0)
MCHC: 30.2 g/dL (ref 30.0–36.0)
MCV: 83.5 fL (ref 80.0–100.0)
Platelets: 268 10*3/uL (ref 150–400)
RBC: 4.24 MIL/uL (ref 3.87–5.11)
RDW: 15.2 % (ref 11.5–15.5)
WBC: 6.2 10*3/uL (ref 4.0–10.5)
nRBC: 0 % (ref 0.0–0.2)

## 2020-09-29 LAB — RAPID URINE DRUG SCREEN, HOSP PERFORMED
Amphetamines: NOT DETECTED
Barbiturates: NOT DETECTED
Benzodiazepines: NOT DETECTED
Cocaine: NOT DETECTED
Opiates: NOT DETECTED
Tetrahydrocannabinol: NOT DETECTED

## 2020-09-29 LAB — COMPREHENSIVE METABOLIC PANEL
ALT: 13 U/L (ref 0–44)
AST: 14 U/L — ABNORMAL LOW (ref 15–41)
Albumin: 3.2 g/dL — ABNORMAL LOW (ref 3.5–5.0)
Alkaline Phosphatase: 173 U/L — ABNORMAL HIGH (ref 38–126)
Anion gap: 9 (ref 5–15)
BUN: 13 mg/dL (ref 6–20)
CO2: 24 mmol/L (ref 22–32)
Calcium: 9.7 mg/dL (ref 8.9–10.3)
Chloride: 107 mmol/L (ref 98–111)
Creatinine, Ser: 0.86 mg/dL (ref 0.44–1.00)
GFR, Estimated: >60 mL/min (ref >60)
Glucose, Bld: 97 mg/dL (ref 70–99)
Potassium: 3.8 mmol/L (ref 3.5–5.1)
Sodium: 140 mmol/L (ref 135–145)
Total Bilirubin: 0.2 mg/dL — ABNORMAL LOW (ref 0.3–1.2)
Total Protein: 7 g/dL (ref 6.5–8.1)

## 2020-09-29 LAB — APTT: aPTT: 32 seconds (ref 24–36)

## 2020-09-29 LAB — I-STAT BETA HCG BLOOD, ED (MC, WL, AP ONLY): I-stat hCG, quantitative: <5 m[IU]/mL (ref <5)

## 2020-09-29 LAB — RESP PANEL BY RT-PCR (RSV, FLU A&B, COVID)  RVPGX2
Influenza A by PCR: NEGATIVE
Influenza B by PCR: NEGATIVE
Resp Syncytial Virus by PCR: NEGATIVE
SARS Coronavirus 2 by RT PCR: NEGATIVE

## 2020-09-29 LAB — PROTIME-INR
INR: 1.1 (ref 0.8–1.2)
Prothrombin Time: 13.7 seconds (ref 11.4–15.2)

## 2020-09-29 LAB — ETHANOL: Alcohol, Ethyl (B): <10 mg/dL (ref <10)

## 2020-09-29 MED ORDER — ACETAMINOPHEN 650 MG RE SUPP: 650.0000 mg | Freq: Four times a day (QID) | RECTAL | Status: DC | PRN

## 2020-09-29 MED ORDER — ALBUTEROL SULFATE HFA 108 (90 BASE) MCG/ACT IN AERS: 2.0000 | INHALATION_SPRAY | Freq: Four times a day (QID) | RESPIRATORY_TRACT | Status: DC | PRN

## 2020-09-29 MED ORDER — ACETAMINOPHEN 325 MG PO TABS
650.0000 mg | ORAL_TABLET | Freq: Four times a day (QID) | ORAL | Status: DC | PRN
  Administered 2020-09-30: 650 mg via ORAL
  Filled 2020-09-29: qty 2

## 2020-09-29 MED ORDER — BUDESON-GLYCOPYRROL-FORMOTEROL 160-9-4.8 MCG/ACT IN AERO: 2.0000 | INHALATION_SPRAY | Freq: Two times a day (BID) | RESPIRATORY_TRACT | Status: DC

## 2020-09-29 MED ORDER — SODIUM CHLORIDE 0.9% FLUSH
3.0000 mL | Freq: Two times a day (BID) | INTRAVENOUS | Status: DC
  Administered 2020-09-30 (×2): 3 mL via INTRAVENOUS

## 2020-09-29 MED ORDER — INSULIN ASPART 100 UNIT/ML ~~LOC~~ SOLN: 0.0000 [IU] | Freq: Three times a day (TID) | SUBCUTANEOUS | Status: DC

## 2020-09-29 MED ORDER — FLUTICASONE FUROATE-VILANTEROL 200-25 MCG/INH IN AEPB
1.0000 | INHALATION_SPRAY | Freq: Every day | RESPIRATORY_TRACT | Status: DC
  Administered 2020-09-30: 1 via RESPIRATORY_TRACT
  Filled 2020-09-29 (×2): qty 28

## 2020-09-29 MED ORDER — POLYETHYLENE GLYCOL 3350 17 G PO PACK: 17.0000 g | PACK | Freq: Every day | ORAL | Status: DC | PRN

## 2020-09-29 MED ORDER — FUROSEMIDE 20 MG PO TABS
40.0000 mg | ORAL_TABLET | Freq: Every day | ORAL | Status: DC
  Administered 2020-09-30: 40 mg via ORAL
  Filled 2020-09-29: qty 2

## 2020-09-29 MED ORDER — ACETAMINOPHEN 325 MG PO TABS
650.0000 mg | ORAL_TABLET | Freq: Once | ORAL | Status: AC
  Administered 2020-09-29: 650 mg via ORAL
  Filled 2020-09-29: qty 2

## 2020-09-29 MED ORDER — CYCLOSPORINE 0.05 % OP EMUL
1.0000 [drp] | Freq: Two times a day (BID) | OPHTHALMIC | Status: DC
  Administered 2020-09-30 (×2): 1 [drp] via OPHTHALMIC
  Filled 2020-09-29 (×4): qty 1

## 2020-09-29 MED ORDER — UMECLIDINIUM BROMIDE 62.5 MCG/INH IN AEPB
1.0000 | INHALATION_SPRAY | Freq: Every day | RESPIRATORY_TRACT | Status: DC
  Administered 2020-09-30: 13:00:00 1 via RESPIRATORY_TRACT
  Filled 2020-09-29 (×2): qty 7

## 2020-09-29 MED ORDER — RIVAROXABAN 20 MG PO TABS
20.0000 mg | ORAL_TABLET | Freq: Every day | ORAL | Status: DC
  Administered 2020-09-30: 20 mg via ORAL
  Filled 2020-09-29 (×2): qty 1

## 2020-09-29 MED ORDER — FLECAINIDE ACETATE 50 MG PO TABS
50.0000 mg | ORAL_TABLET | Freq: Two times a day (BID) | ORAL | Status: DC
  Administered 2020-09-30 (×2): 50 mg via ORAL
  Filled 2020-09-29 (×4): qty 1

## 2020-09-29 MED ORDER — CYCLOBENZAPRINE HCL 10 MG PO TABS
5.0000 mg | ORAL_TABLET | Freq: Once | ORAL | Status: AC
  Administered 2020-09-30: 5 mg via ORAL
  Filled 2020-09-29: qty 1

## 2020-09-29 MED ORDER — RIVAROXABAN 20 MG PO TABS
20.0000 mg | ORAL_TABLET | Freq: Every day | ORAL | Status: DC
  Filled 2020-09-29: qty 1

## 2020-09-29 NOTE — ED Notes (Signed)
Patient transported to MRI 

## 2020-09-29 NOTE — ED Notes (Signed)
Pt given sandwich bag 

## 2020-09-29 NOTE — ED Triage Notes (Signed)
To ED via [facility] from home, with c/o chest pain that started last night - worse this am, also c/o left arm "heaviness" - hx of old stroke- but is weaker on the left side than normal. Slight left facial droop -- speech clear,  Fully vaccinated plus booster

## 2020-09-29 NOTE — H&P (Signed)
History and Physical   ADA HOLNESS OVZ:[id number] DOB: 1965/11/16 DOA: 09/29/2020  PCP: Beverley Fiedler, FNP   Patient coming from: Home  Chief Complaint: Chest pain, left-sided weakness  HPI: Meghan [name] is a 55 y.o. female with medical history significant of asthma, CHF, history of PE, A. fib on Xarelto, sleep apnea, seizure, CVA, diabetes, obesity, depression who presents with chest pain and left-sided weakness.  She states she does have a history of a stroke and did have some residual left sided weakness after the stroke but that had largely improved after her therapy.  She states for the past days she has had some significant left-sided weakness progressing to the point where she is having difficulty ambulating.  She also has had some ongoing chest pain and a headache.  She denies fever, chills, shortness of breath, abdominal pain.  Has not tried anything for symptoms at home.  ED Course: Vitals in the ED were stable.  Lab work-up showed BMP that was within normal limits, LFTs with albumin of 3.2 and alk phos stable at 173.  CBC with hemoglobin stable 10.7.  PT, PTT, INR within normal limits.  Troponin negative x2.  Respiratory panel including COVID-negative.  Ethanol level negative, urinalysis within normal limits, UDS negative.  Chest x-ray showed stable pulmonary vascular congestion, and MRI was negative for stroke or other acute abnormality.  Neurology was consulted in ED.  Review of Systems: As per HPI otherwise all other systems reviewed and are negative.  Past Medical History:  Diagnosis Date  . Arthritis    left knee  . Asthma   . Atrial fibrillation ([place])   . Atrial fibrillation ([place]) 02/16/2019  . CHF (congestive heart failure) ([place]) 01/2019   Physician from [place] informed her she had this   . Dysrhythmia    Atrial fibrillation  . Headache   . Hiatal hernia   . History of pulmonary embolus (PE)   . Hypercholesterolemia   . Macular degeneration    of left eye  .  Osteoarthritis    knees  . Pneumonia    in the past  . Pulmonary embolism ([place])    bilat  . Seizure ([place])   . Seizure disorder ([place]) 2017  . Sleep apnea CPAP   [facility]  . Stroke [facility])    1989 and 1995 (left sided weakness)  . Vitamin D deficiency     Past Surgical History:  Procedure Laterality Date  . ABDOMINAL HYSTERECTOMY  2003   complete  . CARDIAC CATHETERIZATION  2017   in [facility] in [place]  . CESAREAN SECTION  [id number]   x 2  . CESAREAN SECTION     x 2  . CHOLECYSTECTOMY  2003  . COLONOSCOPY WITH PROPOFOL N/A 06/05/2019   Procedure: COLONOSCOPY WITH PROPOFOL;  Surgeon: Carol Ada, MD;  Location: [facility] ENDOSCOPY;  Service: Endoscopy;  Laterality: N/A;  . HERNIA REPAIR  [id number], [id number]   umbilical hernia repair  . IVC FILTER INSERTION  2003   Hx pulmonary embolus  . POLYPECTOMY  06/05/2019   Procedure: POLYPECTOMY;  Surgeon: Carol Ada, MD;  Location: [facility] ENDOSCOPY;  Service: Endoscopy;;  . SHOULDER ARTHROSCOPY W/ ROTATOR CUFF REPAIR  08/28/2017   right shoulder    Social History  reports that she has never smoked. She has never used smokeless tobacco. She reports that she does not drink alcohol and does not use drugs.  Allergies  Allergen Reactions  . Other Hives and Other (See Comments)  Muscle relaxer that starts with a "T" caused hives (not tizanidine) January or February 2020- made the patient VERY sick  . Acetaminophen-Codeine Swelling, Rash and Other (See Comments)    Tylenol with Codeine, Tylenol #3,  (facial swelling)  . Coconut Oil Rash and Other (See Comments)    ANY coconut products   . Meloxicam Rash  . Tomato Rash    Family History  Problem Relation Age of Onset  . Breast cancer Mother   . Breast cancer Maternal Grandmother   . Heart attack Father   . Diabetes Father   . Congestive Heart Failure Father   Reviewed on admission  Prior to Admission medications   Medication Sig Start Date End Date Taking? Authorizing  Provider  acetaminophen (TYLENOL) 500 MG tablet Take 500 mg by mouth every 6 (six) hours as needed.    [provider]  albuterol (VENTOLIN HFA) 108 (90 Base) MCG/ACT inhaler Inhale 2 puffs into the lungs every 6 (six) hours as needed for wheezing or shortness of breath.    [provider]  BREZTRI AEROSPHERE 160-9-4.8 MCG/ACT AERO Inhale 2 puffs into the lungs in the morning and at bedtime.  03/24/20   [provider]  cephALEXin (KEFLEX) 500 MG capsule Take 1 capsule (500 mg total) by mouth 3 (three) times daily. 06/22/20   Drenda Freeze, MD  Cholecalciferol (VITAMIN D-3) 25 MCG (1000 UT) CAPS Take 1,000 Units by mouth daily.    [provider]  cycloSPORINE (RESTASIS) 0.05 % ophthalmic emulsion Place 1 drop into both eyes 2 (two) times daily.     [provider]  flecainide (TAMBOCOR) 50 MG tablet Take 50 mg by mouth 2 (two) times daily.  09/04/19   [provider]  furosemide (LASIX) 40 MG tablet Take 40 mg by mouth daily with breakfast.     [provider]  HYDROcodone-acetaminophen (NORCO/VICODIN) 5-325 MG tablet Take 1 tablet by mouth every 4 (four) hours as needed. 07/05/20   Henderly, Britni A, PA-C  metFORMIN (GLUCOPHAGE) 500 MG tablet Take 500 mg by mouth 2 (two) times daily with a meal.     [provider]  Multiple Vitamins-Minerals (PRESERVISION AREDS 2 PO) Take 2 capsules by mouth daily with breakfast.     [provider]  Omega-3 Fatty Acids (FISH OIL) 1000 MG CAPS Take 1,000 mg by mouth daily with breakfast.     [provider]  ondansetron (ZOFRAN ODT) 4 MG disintegrating tablet Take 1 tablet (4 mg total) by mouth every 8 (eight) hours as needed for nausea or vomiting. Patient taking differently: Take 4 mg by mouth every 8 (eight) hours as needed for nausea or vomiting (DISSOLVE ORALLY).  02/02/20   Suzy Bouchard, PA-C  OZEMPIC, 0.25 OR 0.5 MG/DOSE, 2 MG/1.5ML SOPN Inject 1 mg into the  skin every Tuesday.  12/18/19   [provider]  rivaroxaban (XARELTO) 20 MG TABS tablet Take 20 mg by mouth at bedtime.     [provider]  tiZANidine (ZANAFLEX) 4 MG tablet Take 4 mg by mouth at bedtime as needed for muscle spasms.     [provider]    Physical Exam: Vitals:   09/29/20 1945 09/29/20 2030 09/29/20 2115 09/29/20 2200  BP: 139/81 130/82 130/78 130/80  Pulse: 66 66 (!) 59 (!) 58  Resp: $Remo'18 18 20 'xDDkX$ (!) 22  Temp:      TempSrc:      SpO2: 96% 96% 96% 98%   Physical Exam  Constitutional:      General: She is not in acute distress.    Appearance: Normal appearance. She is obese.  HENT:     Head: Normocephalic and atraumatic.     Mouth/Throat:     Mouth: Mucous membranes are moist.     Pharynx: Oropharynx is clear.  Eyes:     Extraocular Movements: Extraocular movements intact.     Pupils: Pupils are equal, round, and reactive to light.  Cardiovascular:     Rate and Rhythm: Normal rate and regular rhythm.     Pulses: Normal pulses.     Heart sounds: Normal heart sounds.  Pulmonary:     Effort: Pulmonary effort is normal. No respiratory distress.     Breath sounds: Normal breath sounds.  Abdominal:     General: Bowel sounds are normal. There is no distension.     Palpations: Abdomen is soft.     Tenderness: There is no abdominal tenderness.  Musculoskeletal:        General: No swelling or deformity.  Skin:    General: Skin is warm and dry.  Neurological:     Mental Status: Mental status is at baseline.     Comments: Left-sided weakness 3-4+/5 both upper and lower extremities.  Does appear that with increased effort her strength improves, though never as strong as her right side which is normal.    Labs on Admission: I have personally reviewed following labs and imaging studies  CBC: Recent Labs  Lab 09/29/20 1228 09/29/20 1253  WBC 6.2  --   NEUTROABS 3.7  --   HGB 10.7* 11.2*  HCT 35.4* 33.0*  MCV 83.5  --   PLT 268  --      Basic Metabolic Panel: Recent Labs  Lab 09/29/20 1228 09/29/20 1253  NA 140 142  K 3.8 3.8  CL 107 105  CO2 24  --   GLUCOSE 97 96  BUN 13 15  CREATININE 0.86 0.90  CALCIUM 9.7  --     GFR: CrCl cannot be calculated (Unknown ideal weight.).  Liver Function Tests: Recent Labs  Lab 09/29/20 1228  AST 14*  ALT 13  ALKPHOS 173*  BILITOT 0.2*  PROT 7.0  ALBUMIN 3.2*    Urine analysis:    Component Value Date/Time   COLORURINE COLORLESS (A) 09/29/2020 1228   APPEARANCEUR CLEAR 09/29/2020 1228   LABSPEC 1.006 09/29/2020 1228   [place] 5.0 09/29/2020 [place] 09/29/2020 1228   HGBUR NEGATIVE 09/29/2020 1228   BILIRUBINUR NEGATIVE 09/29/2020 1228   [place] 09/29/2020 1228   PROTEINUR NEGATIVE 09/29/2020 1228   NITRITE NEGATIVE 09/29/2020 1228   LEUKOCYTESUR NEGATIVE 09/29/2020 1228    Radiological Exams on Admission: MR BRAIN WO CONTRAST  Result Date: 09/29/2020 CLINICAL DATA:  Left arm heaviness, slight facial droop EXAM: MRI HEAD WITHOUT CONTRAST TECHNIQUE: Multiplanar, multiecho pulse sequences of the brain and surrounding structures were obtained without intravenous contrast. COMPARISON:  04/08/2020 FINDINGS: Brain: There is no acute infarction or intracranial hemorrhage. There is no intracranial mass, mass effect, or edema. There is no hydrocephalus or extra-axial fluid collection. Ventricles and sulci are normal in size and configuration. Minimal foci of T2 hyperintensity in the supratentorial white matter likely reflect stable minor nonspecific gliosis/demyelination of doubtful clinical significance. Vascular: Major vessel flow voids at the skull base are preserved. Skull and upper cervical spine: Normal marrow signal is preserved. Sinuses/Orbits: Minor mucosal thickening.  Orbits are unremarkable. Other: Sella is unremarkable.  Mastoid  air cells are clear. IMPRESSION: No evidence of recent infarction, hemorrhage, or mass. Electronically  Signed   By: Macy Mis M.D.   On: 09/29/2020 16:40   DG Chest Portable 1 View  Result Date: 09/29/2020 CLINICAL DATA:  Chest pain.  Acute neurologic deficit. EXAM: PORTABLE CHEST 1 VIEW COMPARISON:  05/11/2020 FINDINGS: Stable mild cardiomegaly and pulmonary vascular congestion. No evidence of pulmonary infiltrate or edema. No evidence of pleural effusion. IMPRESSION: Stable mild cardiomegaly and pulmonary vascular congestion. No acute findings. Electronically Signed   By: Marlaine Hind M.D.   On: 09/29/2020 12:47   EKG: Independently reviewed. Sinus rhythm at 72 bpm.  Low voltage.  Assessment/Plan Principal Problem:   Left-sided weakness Active Problems:   Atrial fibrillation (HCC)   Obstructive sleep apnea syndrome   Type 2 diabetes mellitus, without long-term current use of insulin (HCC)   Asthma   Pre-diabetes  Left-sided weakness > Reports history of prior stroke with some mild left-sided weakness > Patient with 1 day of left-sided weakness now to the point where she is having difficulty ambulating > MRI brain normal, getting MRI C-spine > On exam her weakness does appear to increase when she is getting increased with effort could be some psychogenic component -Neurology consulted in the ED, appreciate their assistance with this patient - Follow-up MRI C-spine - Check ESR - PT/OT evaluation - Pending work-up, may need EMG/nerve conduction   Chest pain > Non-Ischemic with normal troponins in ED - Continue to monitor  Asthma - Continue home strategy and as needed albuterol  CHF - Continue home Lasix - I/O, Daily weights  A. fib - Continue home flecainide and Xarelto  Diabetes/prediabetes - SSI  OSA - CPAP nightly  Neck Muscle tightness with tension headache - Not improved with NSAIDs in ED, will give a trial of a dose of muscle relaxants - Flexeril x1  DVT prophylaxis: Xarelto  Code Status:   Full  Family Communication:  Nausea Disposition Plan:    Patient is from:  Home  Anticipated DC to:  Home  Anticipated DC date:  1 to 2 days  Anticipated DC barriers: None  Consults called:  Neurology consulted in ED  Admission status:  Observation, telemetry   Severity of Illness: The appropriate patient status for this patient is OBSERVATION. Observation status is judged to be reasonable and necessary in order to provide the required intensity of service to ensure the patient's safety. The patient's presenting symptoms, physical exam findings, and initial radiographic and laboratory data in the context of their medical condition is felt to place them at decreased risk for further clinical deterioration. Furthermore, it is anticipated that the patient will be medically stable for discharge from the hospital within 2 midnights of admission. The following factors support the patient status of observation.   " The patient's presenting symptoms include chest pain, left-sided weakness. " The physical exam findings include left-sided weakness. " The initial radiographic and laboratory data are stable at this time.Marcelyn Bruins MD [facility]  How to contact the [facility] Attending or Consulting provider [place] or covering provider during after hours [place], for this patient?   1. Check the care team in [facility] and look for a) attending/consulting [facility] provider listed and b) the [facility] team listed 2. Log into [email or web address] and use [place]'s universal password to access. If you do not have the password, please contact the hospital operator. 3. Locate the [facility] provider you are looking for  under [facility] and page to a number that you can be directly reached. 4. If you still have difficulty reaching the provider, please page the [facility] (Director on Call) for the Hospitalists listed on amion for assistance.  09/29/2020, 10:54 PM
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
## 2020-09-29 NOTE — ED Provider Notes (Signed)
[facility] EMERGENCY DEPARTMENT Provider Note   CSN: [id number] Arrival date & time: 09/29/20  1143     History Chief Complaint  Patient presents with  . Chest Pain  . stroke like symptoms    Meghan [name] is a 55 y.o. female.  Presents to ER with concern for chest pain and left arm heaviness.  Patient reports that yesterday evening she started to experience chest pain, described as central tightness.  Moderate to severe in severity, radiated down left arm.  Around 1 AM she felt like her arm was heavier than normal and had slight tingling sensation.  Throughout the day this heaviness sensation seems to have worsened and now feels weak.  Denies any speech changes, vision changes.  Patient took 5 chewable baby aspirin's.  Patient reports history of prior stroke, on Xarelto.  Per review of chart, patient has a history of A. fib, heart failure, history of PE, stroke in 1989 in 1995.  HPI     Past Medical History:  Diagnosis Date  . Arthritis    left knee  . Asthma   . Atrial fibrillation ([place])   . Atrial fibrillation ([place]) 02/16/2019  . CHF (congestive heart failure) ([place]) 01/2019   Physician from [place] informed her she had this   . Dysrhythmia    Atrial fibrillation  . Headache   . Hiatal hernia   . History of pulmonary embolus (PE)   . Hypercholesterolemia   . Macular degeneration    of left eye  . Osteoarthritis    knees  . Pneumonia    in the past  . Pulmonary embolism ([place])    bilat  . Seizure ([place])   . Seizure disorder ([place]) 2017  . Sleep apnea CPAP   [facility]  . Stroke [facility])    1989 and 1995 (left sided weakness)  . Vitamin D deficiency     Patient Active Problem List   Diagnosis Date Noted  . Pain in left finger(s) 07/12/2020  . Seizure-like activity ([place]) 03/25/2020  . History of head injury 03/25/2020  . Obstructive sleep apnea syndrome 09/04/2019  . Acute medial meniscus tear of right knee 07/10/2019  . Class 3 severe obesity due to  excess calories with serious comorbidity and body mass index (BMI) of 50.0 to 59.9 in adult ([place]) 06/01/2019  . Atrial fibrillation ([place]) 04/17/2019  . History of pulmonary embolus (PE) 04/17/2019  . Type 2 diabetes mellitus, without long-term current use of insulin ([place]) 04/17/2019    Past Surgical History:  Procedure Laterality Date  . ABDOMINAL HYSTERECTOMY  2003   complete  . CARDIAC CATHETERIZATION  2017   in [facility] in [place]  . CESAREAN SECTION  [id number]   x 2  . CESAREAN SECTION     x 2  . CHOLECYSTECTOMY  2003  . COLONOSCOPY WITH PROPOFOL N/A 06/05/2019   Procedure: COLONOSCOPY WITH PROPOFOL;  Surgeon: Carol Ada, MD;  [facility];  Service: Endoscopy;  Laterality: N/A;  . HERNIA REPAIR  1245, 8099   umbilical hernia repair  . IVC FILTER INSERTION  2003   Hx pulmonary embolus  . POLYPECTOMY  06/05/2019   Procedure: POLYPECTOMY;  Surgeon: Carol Ada, MD;  [facility];  Service: Endoscopy;;  . SHOULDER ARTHROSCOPY W/ ROTATOR CUFF REPAIR  08/28/2017   right shoulder     OB History   No obstetric history on file.     Family History  Problem Relation Age of Onset  .  Breast cancer Mother   . Breast cancer Maternal Grandmother   . Heart attack Father   . Diabetes Father   . Congestive Heart Failure Father     Social History   Tobacco Use  . Smoking status: Never Smoker  . Smokeless tobacco: Never Used  Vaping Use  . Vaping Use: Never used  Substance Use Topics  . Alcohol use: Never  . Drug use: Never    Home Medications Prior to Admission medications   Medication Sig Start Date End Date Taking? Authorizing Provider  acetaminophen (TYLENOL) 500 MG tablet Take 500 mg by mouth every 6 (six) hours as needed.    [provider]  albuterol (VENTOLIN HFA) 108 (90 Base) MCG/ACT inhaler Inhale 2 puffs into the lungs every 6 (six) hours as needed for wheezing or shortness of breath.    [provider]   BREZTRI AEROSPHERE 160-9-4.8 MCG/ACT AERO Inhale 2 puffs into the lungs in the morning and at bedtime.  03/24/20   [provider]  cephALEXin (KEFLEX) 500 MG capsule Take 1 capsule (500 mg total) by mouth 3 (three) times daily. 06/22/20   Drenda Freeze, MD  Cholecalciferol (VITAMIN D-3) 25 MCG (1000 UT) CAPS Take 1,000 Units by mouth daily.    [provider]  cycloSPORINE (RESTASIS) 0.05 % ophthalmic emulsion Place 1 drop into both eyes 2 (two) times daily.     [provider]  flecainide (TAMBOCOR) 50 MG tablet Take 50 mg by mouth 2 (two) times daily.  09/04/19   [provider]  furosemide (LASIX) 40 MG tablet Take 40 mg by mouth daily with breakfast.     [provider]  HYDROcodone-acetaminophen (NORCO/VICODIN) 5-325 MG tablet Take 1 tablet by mouth every 4 (four) hours as needed. 07/05/20   Henderly, Britni A, PA-C  metFORMIN (GLUCOPHAGE) 500 MG tablet Take 500 mg by mouth 2 (two) times daily with a meal.     [provider]  Multiple Vitamins-Minerals (PRESERVISION AREDS 2 PO) Take 2 capsules by mouth daily with breakfast.     [provider]  Omega-3 Fatty Acids (FISH OIL) 1000 MG CAPS Take 1,000 mg by mouth daily with breakfast.     [provider]  ondansetron (ZOFRAN ODT) 4 MG disintegrating tablet Take 1 tablet (4 mg total) by mouth every 8 (eight) hours as needed for nausea or vomiting. Patient taking differently: Take 4 mg by mouth every 8 (eight) hours as needed for nausea or vomiting (DISSOLVE ORALLY).  02/02/20   Suzy Bouchard, PA-C  OZEMPIC, 0.25 OR 0.5 MG/DOSE, 2 MG/1.5ML SOPN Inject 1 mg into the skin every Tuesday.  12/18/19   [provider]  rivaroxaban (XARELTO) 20 MG TABS tablet Take 20 mg by mouth at bedtime.     [provider]  tiZANidine (ZANAFLEX) 4 MG tablet Take 4 mg by mouth at bedtime as needed for muscle spasms.     [provider]    Allergies    Other,  Acetaminophen-codeine, Coconut oil, Meloxicam, and Tomato  Review of Systems   Review of Systems  Constitutional: Negative for chills and fever.  HENT: Negative for ear pain and sore throat.   Eyes: Negative for pain and visual disturbance.  Respiratory: Negative for cough and shortness of breath.   Cardiovascular: Positive for chest pain. Negative for palpitations.  Gastrointestinal: Negative for abdominal pain and vomiting.  Genitourinary: Negative for dysuria and hematuria.  Musculoskeletal: Positive for arthralgias. Negative for back pain.  Skin: Negative for color change and rash.  Neurological: Positive for weakness and numbness. Negative for seizures and syncope.  All other systems reviewed and are negative.   Physical Exam Updated Vital Signs BP 133/82 (BP Location: Right Wrist)   Pulse 64   Temp 99.1 F (37.3 C) (Oral)   Resp 16   SpO2 95%   Physical Exam Vitals and nursing note reviewed.  Constitutional:      General: She is not in acute distress.    Appearance: She is well-developed and well-nourished.  HENT:     Head: Normocephalic and atraumatic.  Eyes:     Conjunctiva/sclera: Conjunctivae normal.  Cardiovascular:     Rate and Rhythm: Normal rate and regular rhythm.     Heart sounds: No murmur heard.   Pulmonary:     Effort: Pulmonary effort is normal. No respiratory distress.     Breath sounds: Normal breath sounds.  Abdominal:     Palpations: Abdomen is soft.     Tenderness: There is no abdominal tenderness.  Musculoskeletal:        General: No edema.     Cervical back: Neck supple.  Skin:    General: Skin is warm and dry.  Neurological:     Mental Status: She is alert.     Comments: AAOx3 CN 2-12 intact, speech clear visual fields intact 5/5 strength in RUE, RLE 2/5 strength in LUE and LLE Sensation to light touch intact in RUE and RLE but slightly decreased in LUE, LLE Normal FNF Normal gait  Psychiatric:        Mood and Affect: Mood and  affect normal.     ED Results / Procedures / Treatments   Labs (all labs ordered are listed, but only abnormal results are displayed) Labs Reviewed  CBC - Abnormal; Notable for the following components:      Result Value   Hemoglobin 10.7 (*)    HCT 35.4 (*)    MCH 25.2 (*)    All other components within normal limits  COMPREHENSIVE METABOLIC PANEL - Abnormal; Notable for the following components:   Albumin 3.2 (*)    AST 14 (*)    Alkaline Phosphatase 173 (*)    Total Bilirubin 0.2 (*)    All other components within normal limits  I-STAT CHEM 8, ED - Abnormal; Notable for the following components:   Hemoglobin 11.2 (*)    HCT 33.0 (*)    All other components within normal limits  ETHANOL  PROTIME-INR  APTT  DIFFERENTIAL  RAPID URINE DRUG SCREEN, HOSP PERFORMED  URINALYSIS, ROUTINE W REFLEX MICROSCOPIC  I-STAT BETA HCG BLOOD, ED ([facility], [facility], [facility] ONLY)  TROPONIN I (HIGH SENSITIVITY)  TROPONIN I (HIGH SENSITIVITY)    EKG EKG Interpretation  Date/Time:  Thursday September 29 2020 11:44:00 EST Ventricular Rate:  72 PR Interval:    QRS Duration: 79 QT Interval:  366 QTC Calculation: 401 R Axis:   52 Text Interpretation: Sinus rhythm Probable left atrial enlargement Low voltage, precordial leads Confirmed by Marianna Fuss ([id number]) on 09/29/2020 12:39:03 PM   Radiology DG Chest Portable 1 View  Result Date: 09/29/2020 CLINICAL DATA:  Chest pain.  Acute neurologic deficit. EXAM: PORTABLE CHEST 1 VIEW COMPARISON:  05/11/2020 FINDINGS: Stable mild cardiomegaly and pulmonary vascular congestion. No evidence of pulmonary infiltrate or edema. No evidence of pleural effusion. IMPRESSION: Stable mild cardiomegaly and pulmonary vascular congestion. No acute findings. Electronically Signed   By: Danae Orleans M.D.   On: 09/29/2020  12:47    Procedures Procedures (including critical care time)  Medications Ordered in ED Medications - No data to display  ED Course  I have reviewed  the triage vital signs and the nursing notes.  Pertinent labs & imaging results that were available during my care of the patient were reviewed by me and considered in my medical decision making (see chart for details).  Clinical Course as of 09/29/20 1513  Thu Sep 29, 2020  1238 D/w Malen Gauze - recommends go straight to MRI [RD]    Clinical Course User Index [RD] Lucrezia Starch, MD   MDM Rules/Calculators/A&P                           55 year old lady presents to ER with concern for chest pain as well as arm heaviness.  On physical exam, patient is well-appearing in no acute distress.  Regarding the chest pain she has no obvious ischemic change on EKG and her initial troponin is within normal limits.  Lower suspicion for ACS at this time.  CXR negative.  Regarding the arm heaviness, noted significant weakness on exam in both arm and leg.  Raises concern for possible CVA.  Patient is Lucianne Lei negative, well outside any window for tPA and patient already on anticoagulation and would not be a candidate.  Therefore deferred stroke alert.  Reviewed with neurology who recommended going straight to MRI for brain imaging.  While awaiting reassessment, repeat troponin and MRI, signed out to Dr. Dina Rich.  Final Clinical Impression(s) / ED Diagnoses Final diagnoses:  Left arm weakness  Left leg weakness  Chest pain, unspecified type    Rx / DC Orders ED Discharge Orders    None       Lucrezia Starch, MD 09/29/20 1513

## 2020-09-29 NOTE — ED Provider Notes (Signed)
Patient signed out to me by previous provider.  Briefly this is a 55 year old female who presented for chest pain and left-sided weakness.  Patient states that she has had some left-sided weakness at baseline however over the last day it has become acutely worsened.  Patient had significant left-sided weakness most pronounced in the left lower extremity for the previous provider.   Physical Exam  BP 133/80   Pulse 67   Temp 99.1 F (37.3 C) (Oral)   Resp 18   SpO2 92%   Physical Exam  ED Course/Procedures   Clinical Course as of 09/29/20 1805  Thu Sep 29, 2020  1238 D/w Malen Gauze - recommends go straight to MRI [RD]    Clinical Course User Index [RD] Lucrezia Starch, MD    Procedures  MDM   From a cardiac standpoint her EKG and troponins have been negative.  MRI was recommended by neurology for evaluation of strokelike symptoms.  MRI does not identify any acute lesion/finding.  On my reevaluation patient continues to have profound weakness of the left lower extremity.  Neurology has been notified, they have reviewed the MRI imaging, they agree with her pronounced symptoms that she will require further evaluation.  Patient's vitals are stable, she is complaining of a mild headache but has not had anything to eat or drink today.  No new neurologic symptoms.  Patients evaluation and results requires admission for further treatment and care. Patient agrees with admission plan, offers no new complaints and is stable/unchanged at time of admit.      Lorelle Gibbs, DO 09/29/20 1807

## 2020-09-29 NOTE — ED Notes (Signed)
Paged Neurology for Dr. [name] Amble at 12:23pm

## 2020-09-30 ENCOUNTER — Observation Stay (HOSPITAL_COMMUNITY): Payer: Medicare Other

## 2020-09-30 DIAGNOSIS — R531 Weakness: Secondary | ICD-10-CM | POA: Diagnosis not present

## 2020-09-30 LAB — CBC
HCT: 33.9 % — ABNORMAL LOW (ref 36.0–46.0)
Hemoglobin: 10.3 g/dL — ABNORMAL LOW (ref 12.0–15.0)
MCH: 25.5 pg — ABNORMAL LOW (ref 26.0–34.0)
MCHC: 30.4 g/dL (ref 30.0–36.0)
MCV: 83.9 fL (ref 80.0–100.0)
Platelets: 246 10*3/uL (ref 150–400)
RBC: 4.04 MIL/uL (ref 3.87–5.11)
RDW: 15.2 % (ref 11.5–15.5)
WBC: 5.5 10*3/uL (ref 4.0–10.5)
nRBC: 0 % (ref 0.0–0.2)

## 2020-09-30 LAB — COMPREHENSIVE METABOLIC PANEL
ALT: 14 U/L (ref 0–44)
AST: 22 U/L (ref 15–41)
Albumin: 3 g/dL — ABNORMAL LOW (ref 3.5–5.0)
Alkaline Phosphatase: 161 U/L — ABNORMAL HIGH (ref 38–126)
Anion gap: 9 (ref 5–15)
BUN: 16 mg/dL (ref 6–20)
CO2: 25 mmol/L (ref 22–32)
Calcium: 9.4 mg/dL (ref 8.9–10.3)
Chloride: 104 mmol/L (ref 98–111)
Creatinine, Ser: 0.84 mg/dL (ref 0.44–1.00)
GFR, Estimated: >60 mL/min (ref >60)
Glucose, Bld: 93 mg/dL (ref 70–99)
Potassium: 4.3 mmol/L (ref 3.5–5.1)
Sodium: 138 mmol/L (ref 135–145)
Total Bilirubin: 0.8 mg/dL (ref 0.3–1.2)
Total Protein: 6.7 g/dL (ref 6.5–8.1)

## 2020-09-30 LAB — CBG MONITORING, ED
Glucose-Capillary: 78 mg/dL (ref 70–99)
Glucose-Capillary: 102 mg/dL — ABNORMAL HIGH (ref 70–99)

## 2020-09-30 LAB — SEDIMENTATION RATE: Sed Rate: 70 mm/hr — ABNORMAL HIGH (ref 0–22)

## 2020-09-30 LAB — HIV ANTIBODY (ROUTINE TESTING W REFLEX): HIV Screen 4th Generation wRfx: NONREACTIVE

## 2020-09-30 LAB — D-DIMER, QUANTITATIVE: D-Dimer, Quant: 0.39 ug/mL-FEU (ref 0.00–0.50)

## 2020-09-30 MED ORDER — GADOBUTROL 1 MMOL/ML IV SOLN
10.0000 mL | Freq: Once | INTRAVENOUS | Status: AC | PRN
  Administered 2020-09-30: 10 mL via INTRAVENOUS

## 2020-09-30 NOTE — ED Notes (Signed)
Pt to MRI

## 2020-09-30 NOTE — Progress Notes (Signed)
OT Cancellation Note  Patient Details Name: Meghan [name] MRN: [id number] DOB: [birth year]   Cancelled Treatment:    Reason Eval/Treat Not Completed: Other (comment) (Order to wait until after Chest MRI). Will assess later time.   [name],[name] 09/30/2020, 10:08 AM  Maurie Boettcher, OT/L   Acute OT Clinical Specialist Acute Rehabilitation Services Pager [phone] Office [phone]

## 2020-09-30 NOTE — Progress Notes (Signed)
Asked to follow-up on brachial plexus MRI which was reported normal. Further recommendations as provided by the earlier consultation from Dr. Curly Shores   Neurology will be available as needed.  -- Amie Portland, MD Neurologist [facility] Pager: [phone]

## 2020-09-30 NOTE — ED Notes (Signed)
RN messaged main pharmacy for overdue missing medications

## 2020-09-30 NOTE — Evaluation (Signed)
Physical Therapy Evaluation Patient Details Name: Meghan [name] MRN: [id number] DOB: 1966-08-20 Today's Date: 09/30/2020   History of Present Illness  55 y.o. female with medical history significant of asthma, CHF, history of PE, A. fib on Xarelto, sleep apnea, seizure, CVA, diabetes, Macular degeneration L eye, obesity, depression who presents with chest pain and left-sided weakness. MRI of head, neck and chest negative.  Clinical Impression  Pt admitted secondary to problem above with deficits below. Pt reporting weakness in LLE, however, with positive hoover's sign. Min guard A for bed mobility and to take steps at EOB in room using RW. Pt reports friends and family can assist at d/c. Recommend HHPT at d/c to ensure safety at home. Will continue to follow acutely.     Follow Up Recommendations Home health PT    Equipment Recommendations  Rolling walker with 5" wheels    Recommendations for Other Services       Precautions / Restrictions Precautions Precautions: Fall Restrictions Weight Bearing Restrictions: No      Mobility  Bed Mobility Overal bed mobility: Needs Assistance Bed Mobility: Supine to Sit;Sit to Supine     Supine to sit: Min guard Sit to supine: Min guard   General bed mobility comments: Min guard for safety. Pt requiring increased time for LLE movement, but did not require physical assist.    Transfers Overall transfer level: Needs assistance Equipment used: Rolling walker (2 wheeled) Transfers: Sit to/from Stand Sit to Stand: Min guard;+2 safety/equipment         General transfer comment: Min guard for safety. Cues for hand placement.  Ambulation/Gait Ambulation/Gait assistance: Min guard;+2 safety/equipment   Assistive device: Rolling walker (2 wheeled) Gait Pattern/deviations: Step-to pattern;Decreased step length - left;Decreased step length - right;Decreased weight shift to left Gait velocity: Decreased   General Gait Details: Very  guarded gait. No functional instability noted in LLE, however, pt reporting weakness. Min guard for safety. No physical assist required.  Stairs            Wheelchair Mobility    Modified Rankin (Stroke Patients Only)       Balance Overall balance assessment: Needs assistance Sitting-balance support: No upper extremity supported Sitting balance-Leahy Scale: Fair     Standing balance support: Bilateral upper extremity supported Standing balance-Leahy Scale: Poor Standing balance comment: Reliant on BUE support                             Pertinent Vitals/Pain Pain Assessment: 0-10 Pain Score: 10-Worst pain ever Pain Location: R knee and chest Pain Descriptors / Indicators: Aching Pain Intervention(s): Limited activity within patient's tolerance;Monitored during session;Repositioned    Home Living Family/patient expects to be discharged to:: Private residence Living Arrangements: Alone Available Help at Discharge: Available 24 hours/day Type of Home: Apartment Home Access: Level entry     Home Layout: One level Home Equipment: None      Prior Function Level of Independence: Independent         Comments: I with IADL tasks; goes to [employer] 2x/wk     Hand Dominance   Dominant Hand: Right    Extremity/Trunk Assessment   Upper Extremity Assessment Upper Extremity Assessment: Defer to OT evaluation    Lower Extremity Assessment Lower Extremity Assessment: LLE deficits/detail LLE Deficits / Details: Pt reporting LLE weakness. Inconsistent presentation throughout and had positive Hoover's sign.    Cervical / Trunk Assessment Cervical / Trunk Assessment: Normal  Communication   Communication: No difficulties  Cognition Arousal/Alertness: Awake/alert Behavior During Therapy: WFL for tasks assessed/performed Overall Cognitive Status: No family/caregiver present to determine baseline cognitive functioning                                         General Comments      Exercises     Assessment/Plan    PT Assessment Patient needs continued PT services  PT Problem List Decreased strength;Decreased mobility;Decreased activity tolerance;Decreased knowledge of use of DME       PT Treatment Interventions DME instruction;Gait training;Functional mobility training;Therapeutic activities;Therapeutic exercise;Balance training;Patient/family education    PT Goals (Current goals can be found in the Care Plan section)  Acute Rehab PT Goals Patient Stated Goal: to be independent PT Goal Formulation: With patient Time For Goal Achievement: 10/14/20 Potential to Achieve Goals: Good    Frequency Min 3X/week   Barriers to discharge        Co-evaluation PT/OT/SLP Co-Evaluation/Treatment: Yes Reason for Co-Treatment: Necessary to address cognition/behavior during functional activity;For patient/therapist safety PT goals addressed during session: Mobility/safety with mobility;Balance OT goals addressed during session: ADL's and self-care       AM-PAC PT "6 Clicks" Mobility  Outcome Measure Help needed turning from your back to your side while in a flat bed without using bedrails?: A Little Help needed moving from lying on your back to sitting on the side of a flat bed without using bedrails?: A Little Help needed moving to and from a bed to a chair (including a wheelchair)?: A Little Help needed standing up from a chair using your arms (e.g., wheelchair or bedside chair)?: A Little Help needed to walk in hospital room?: A Little Help needed climbing 3-5 steps with a railing? : A Lot 6 Click Score: 17    End of Session Equipment Utilized During Treatment: Gait belt Activity Tolerance: Patient tolerated treatment well Patient left: in bed;with call bell/phone within reach (on stretcher in ED) Nurse Communication: Mobility status PT Visit Diagnosis: Muscle weakness (generalized) (M62.81)    Time:  3154-0086 PT Time Calculation (min) (ACUTE ONLY): 28 min   Charges:   PT Evaluation $PT Eval Low Complexity: 1 Low          Lou Miner, DPT  Acute Rehabilitation Services  Pager: [phone] Office: [phone]   Rudean Hitt 09/30/2020, 3:35 PM

## 2020-09-30 NOTE — ED Notes (Signed)
Patient transported to MRI 

## 2020-09-30 NOTE — Discharge Summary (Signed)
Physician Discharge Summary  Meghan [name] OEC:[id number] DOB: [birth year] DOA: 09/29/2020  PCP: Felix Pacini, FNP  Admit date: 09/29/2020 Discharge date: 10/01/2020  Admitted From: home Disposition:  hh/op pt  Recommendations for Outpatient Follow-up:  Follow up with PCP and w/ cardiology in 1-2 weeks Please obtain BMP/CBC in one week Please follow up on the following pending results:  Home Health:yes  Equipment/Devices: RW  Discharge Condition: Stable Code Status:   Code Status: Prior Diet recommendation:  Diet Order             Diet - low sodium heart healthy                    Brief/Interim Summary: Brief narrative: As per admitting: 56 y.o. female with medical history significant of asthma, CHF, history of PE, A. fib on Xarelto, sleep apnea, seizure, CVA, diabetes, obesity, depression who presents with chest pain and left-sided weakness.She states she does have a history of a stroke and did have some residual left sided weakness after the stroke but that had largely improved after her therapy.  She states for the past days she has had some significant left-sided weakness progressing to the point where she is having difficulty ambulating.  She also has had some ongoing chest pain and a headache.  She denies fever, chills, shortness of breath, abdominal pain.  Has not tried anything for symptoms at home.   ED Course: Vitals in the ED were stable.  Lab work-up showed BMP that was within normal limits, LFTs with albumin of 3.2 and alk phos stable at 173.  CBC with hemoglobin stable 10.7.  PT, PTT, INR within normal limits.  Troponin negative x2.  Respiratory panel including COVID-negative.  Ethanol level negative, urinalysis within normal limits, UDS negative.  Chest x-ray showed stable pulmonary vascular congestion, and MRI was negative for stroke or other acute abnormality.  Neurology was consulted in ED. MRI spine was done with no acute finding.  Neurology then ordered  MRI of the brachial plexus which was unremarkable. Neurology advised outpatient follow-up with EMG with her neurologist. Seen by PT OT advised home health pt/ot, rw.  PT found positive Hoover sign Patient is agreeable with discharge disposition has family at home.   Discharge Diagnoses:  Left-sided weakness unclear etiology per PT positive Hoover sign.  MRI brain MRI spine urine MRI brachial plexus unremarkable.  She worked with PT OT and okay to discharge with home health PT OT.  Further outpatient neurology follow-up and EMG study as per neurology.  Chest pain, atypical with normal troponins.  Already anticoagulated D-dimer done for completeness and negative. Resolved. Cont home meds. Chronic asthma respiratory status is stable A. fib continue home flecainide and Xarelto. Diabetes/prediabetes continue home regimen diet controlled OSA continue CPAP nightly Neck muscle tightness tension headache stable improved.**  Consults: Neurology  Subjective: Alert awake oriented. Discharge Exam: Vitals:   09/30/20 1700 09/30/20 1821  BP: (!) 154/87 124/79  Pulse: 62 87  Resp: (!) 28 18  Temp:  97.8 F (36.6 C)  SpO2: 97% 98%   General: Pt is alert, awake, not in acute distress Cardiovascular: RRR, S1/S2 +, no rubs, no gallops Respiratory: CTA bilaterally, no wheezing, no rhonchi Abdominal: Soft, NT, ND, bowel sounds + Extremities: no edema, no cyanosis  Discharge Instructions  Discharge Instructions     Ambulatory referral to Neurology   Complete by: As directed    An appointment is requested in approximately: for emg and for left  sided weakness follow up   Diet - low sodium heart healthy   Complete by: As directed    Discharge instructions   Complete by: As directed    Please call call MD or return to ER for similar or worsening recurring problem that brought you to hospital or if any fever,nausea/vomiting,abdominal pain, uncontrolled pain, chest pain,  shortness of breath or  any other alarming symptoms.  Please follow-up with the neurology.  Please follow-up your doctor as instructed in a week time and call the office for appointment.  Please avoid alcohol, smoking, or any other illicit substance and maintain healthy habits including taking your regular medications as prescribed.  You were cared for by a hospitalist during your hospital stay. If you have any questions about your discharge medications or the care you received while you were in the hospital after you are discharged, you can call the unit and ask to speak with the hospitalist on call if the hospitalist that took care of you is not available.  Once you are discharged, your primary care physician will handle any further medical issues. Please note that NO REFILLS for any discharge medications will be authorized once you are discharged, as it is imperative that you return to your primary care physician (or establish a relationship with a primary care physician if you do not have one) for your aftercare needs so that they can reassess your need for medications and monitor your lab values   Increase activity slowly   Complete by: As directed       Allergies as of 09/30/2020       Reactions   Acetaminophen-codeine Swelling, Rash, Other (See Comments)   Tylenol with Codeine, Tylenol #3,  (facial swelling, hives)   Other Hives, Other (See Comments)   Muscle relaxer that starts with a "T" caused hives (not tizanidine) January or February 2020- made the patient VERY sick   Coconut Oil Rash, Other (See Comments)   ANY coconut products    Meloxicam Rash   Tomato Rash        Medication List     TAKE these medications    acetaminophen 500 MG tablet Commonly known as: TYLENOL Take 1,000 mg by mouth every 6 (six) hours as needed for moderate pain.   albuterol 108 (90 Base) MCG/ACT inhaler Commonly known as: VENTOLIN HFA Inhale 2 puffs into the lungs every 6 (six) hours as needed for wheezing or  shortness of breath.   amoxicillin 500 MG capsule Commonly known as: AMOXIL Take 500 mg by mouth every 6 (six) hours.   Breztri Aerosphere 160-9-4.8 MCG/ACT Aero Generic drug: Budeson-Glycopyrrol-Formoterol Inhale 2 puffs into the lungs in the morning and at bedtime.   cycloSPORINE 0.05 % ophthalmic emulsion Commonly known as: RESTASIS Place 1 drop into both eyes 2 (two) times daily.   Fish Oil 1000 MG Caps Take 1,000 mg by mouth daily with breakfast.   flecainide 50 MG tablet Commonly known as: TAMBOCOR Take 50 mg by mouth 2 (two) times daily.   furosemide 40 MG tablet Commonly known as: LASIX Take 40 mg by mouth daily with breakfast.   ibuprofen 600 MG tablet Commonly known as: ADVIL Take 600 mg by mouth every 6 (six) hours as needed for pain. for pain   metFORMIN 500 MG tablet Commonly known as: GLUCOPHAGE Take 500 mg by mouth daily with breakfast.   omeprazole 40 MG capsule Commonly known as: PRILOSEC Take 40 mg by mouth daily.   ondansetron 4 MG  disintegrating tablet Commonly known as: Zofran ODT Take 1 tablet (4 mg total) by mouth every 8 (eight) hours as needed for nausea or vomiting.   Ozempic (0.25 or 0.5 MG/DOSE) 2 MG/1.5ML Sopn Generic drug: Semaglutide(0.25 or 0.5MG /DOS) Inject 0.5 mg into the skin every Sunday.   PRESERVISION AREDS 2 PO Take 2 capsules by mouth daily with breakfast.   promethazine 12.5 MG tablet Commonly known as: PHENERGAN Take 12.5 mg by mouth at bedtime.   rivaroxaban 20 MG Tabs tablet Commonly known as: XARELTO Take 20 mg by mouth at bedtime.   tiZANidine 4 MG tablet Commonly known as: ZANAFLEX Take 4 mg by mouth at bedtime as needed for muscle spasms.   Vitamin D-3 25 MCG (1000 UT) Caps Take 1,000 Units by mouth daily.        Follow-up Information     Beverley Fiedler, FNP Follow up in 1 week(s).   Specialty: Endocrinology Contact information: [address] [place] [address] [phone]                 Allergies  Allergen Reactions   Acetaminophen-Codeine Swelling, Rash and Other (See Comments)    Tylenol with Codeine, Tylenol #3,  (facial swelling, hives)   Other Hives and Other (See Comments)    Muscle relaxer that starts with a "T" caused hives (not tizanidine) January or February 2020- made the patient VERY sick   Coconut Oil Rash and Other (See Comments)    ANY coconut products    Meloxicam Rash   Tomato Rash    The results of significant diagnostics from this hospitalization (including imaging, microbiology, ancillary and laboratory) are listed below for reference.    Microbiology: Recent Results (from the past 240 hour(s))  Resp panel by RT-PCR (RSV, Flu A&B, Covid) Nasopharyngeal Swab     Status: None   Collection Time: 09/29/20  6:24 PM   Specimen: Nasopharyngeal Swab; Nasopharyngeal(NP) swabs in vial transport medium  Result Value Ref Range Status   SARS Coronavirus 2 by RT PCR NEGATIVE NEGATIVE Final    Comment: (NOTE) SARS-CoV-2 target nucleic acids are NOT DETECTED.  The SARS-CoV-2 RNA is generally detectable in upper respiratory specimens during the acute phase of infection. The lowest concentration of SARS-CoV-2 viral copies this assay can detect is 138 copies/mL. A negative result does not preclude SARS-Cov-2 infection and should not be used as the sole basis for treatment or other patient management decisions. A negative result may occur with  improper specimen collection/handling, submission of specimen other than nasopharyngeal swab, presence of viral mutation(s) within the areas targeted by this assay, and inadequate number of viral copies(<138 copies/mL). A negative result must be combined with clinical observations, patient history, and epidemiological information. The expected result is Negative.  Fact Sheet for Patients:  EntrepreneurPulse.com.au  Fact Sheet for Healthcare Providers:   IncredibleEmployment.be  This test is no t yet approved or cleared by the [place] FDA and  has been authorized for detection and/or diagnosis of SARS-CoV-2 by FDA under an Emergency Use Authorization (EUA). This EUA will remain  in effect (meaning this test can be used) for the duration of the COVID-19 declaration under Section 564(b)(1) of the Act, 21 U.S.C.section 360bbb-3(b)(1), unless the authorization is terminated  or revoked sooner.       Influenza A by PCR NEGATIVE NEGATIVE Final   Influenza B by PCR NEGATIVE NEGATIVE Final    Comment: (NOTE) The Xpert Xpress SARS-CoV-2/FLU/RSV plus assay is intended as an aid in the  diagnosis of influenza from Nasopharyngeal swab specimens and should not be used as a sole basis for treatment. Nasal washings and aspirates are unacceptable for Xpert Xpress SARS-CoV-2/FLU/RSV testing.  Fact Sheet for Patients: EntrepreneurPulse.com.au  Fact Sheet for Healthcare Providers: IncredibleEmployment.be  This test is not yet approved or cleared by the [place] FDA and has been authorized for detection and/or diagnosis of SARS-CoV-2 by FDA under an Emergency Use Authorization (EUA). This EUA will remain in effect (meaning this test can be used) for the duration of the COVID-19 declaration under Section 564(b)(1) of the Act, 21 U.S.C. section 360bbb-3(b)(1), unless the authorization is terminated or revoked.     Resp Syncytial Virus by PCR NEGATIVE NEGATIVE Final    Comment: (NOTE) Fact Sheet for Patients: EntrepreneurPulse.com.au  Fact Sheet for Healthcare Providers: IncredibleEmployment.be  This test is not yet approved or cleared by the [place] FDA and has been authorized for detection and/or diagnosis of SARS-CoV-2 by FDA under an Emergency Use Authorization (EUA). This EUA will remain in effect (meaning this test can be used) for  the duration of the COVID-19 declaration under Section 564(b)(1) of the Act, 21 U.S.C. section 360bbb-3(b)(1), unless the authorization is terminated or revoked.  Performed at [facility], [place] [address] [place], [place] [address]     Procedures/Studies: MR BRAIN WO CONTRAST  Result Date: 09/29/2020 CLINICAL DATA:  Left arm heaviness, slight facial droop EXAM: MRI HEAD WITHOUT CONTRAST TECHNIQUE: Multiplanar, multiecho pulse sequences of the brain and surrounding structures were obtained without intravenous contrast. COMPARISON:  04/08/2020 FINDINGS: Brain: There is no acute infarction or intracranial hemorrhage. There is no intracranial mass, mass effect, or edema. There is no hydrocephalus or extra-axial fluid collection. Ventricles and sulci are normal in size and configuration. Minimal foci of T2 hyperintensity in the supratentorial white matter likely reflect stable minor nonspecific gliosis/demyelination of doubtful clinical significance. Vascular: Major vessel flow voids at the skull base are preserved. Skull and upper cervical spine: Normal marrow signal is preserved. Sinuses/Orbits: Minor mucosal thickening.  Orbits are unremarkable. Other: Sella is unremarkable.  Mastoid air cells are clear. IMPRESSION: No evidence of recent infarction, hemorrhage, or mass. Electronically Signed   By: Macy Mis M.D.   On: 09/29/2020 16:40   MR CHEST W WO CONTRAST  Result Date: 09/30/2020 CLINICAL DATA:  Onset chest and left arm pain last night. Sensation of left arm heaviness. EXAM: MRI BRACHIAL PLEXUS WITHOUT AND WITH CONTRAST TECHNIQUE: Multiplanar, multiecho pulse sequences of the neck and surrounding structures were obtained without and with intravenous contrast. The field of view was focused on the left brachial plexus from the neural foramina to the axilla. CONTRAST:  10 mL GADAVIST IV SOLN COMPARISON:  None. FINDINGS: Spinal cord Visualized cervical and thoracic cord appear normal.  Brachial plexus: Appears normal. No mass involving the brachial plexus is identified. There is no fluid collection or mass impinging on the brachial plexus. Muscles and tendons No evidence of denervation atrophy. Rotator cuff tendinopathy is noted. Bones No fracture or worrisome lesion. Cervical spondylosis is noted. Please see report of dedicated cervical spine MRI this same day. 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 Other findings None. IMPRESSION: Normal appearing brachial plexus. No evidence of denervation atrophy. Moderate acromioclavicular osteoarthritis. Rotator cuff tendinopathy. Please note this exam is not designed to evaluate the intrinsic structures of the shoulder. Electronically Signed   By: Inge Rise M.D.   On: 09/30/2020 13:15   MR CERVICAL SPINE WO CONTRAST  Result Date:  09/30/2020 CLINICAL DATA:  Myelopathy EXAM: MRI CERVICAL SPINE WITHOUT CONTRAST TECHNIQUE: Multiplanar, multisequence MR imaging of the cervical spine was performed. No intravenous contrast was administered. COMPARISON:  None. FINDINGS: Alignment: Reversal of normal cervical lordosis may be positional or due to muscle spasm. Vertebrae: No fracture, evidence of discitis, or bone lesion. Cord: Normal signal and morphology. Posterior Fossa, vertebral arteries, paraspinal tissues: Negative. Disc levels: C1-2: Unremarkable. C2-3: Normal disc space and facet joints. There is no spinal canal stenosis. No neural foraminal stenosis. C3-4: Normal disc space and facet joints. There is no spinal canal stenosis. No neural foraminal stenosis. C4-5: Small disc bulge with minimal uncovertebral spurring. There is no spinal canal stenosis. No neural foraminal stenosis. C5-6: Normal disc space and facet joints. There is no spinal canal stenosis. No neural foraminal stenosis. C6-7: Small right foraminal protrusion. There is no spinal canal stenosis. No neural foraminal stenosis. C7-T1: Normal disc space and facet joints.  There is no spinal canal stenosis. No neural foraminal stenosis. IMPRESSION: 1. No spinal canal or neural foraminal stenosis. 2. Reversal of normal cervical lordosis may be positional or due to muscle spasm. Electronically Signed   By: Ulyses Jarred M.D.   On: 09/30/2020 02:39   DG Chest Portable 1 View  Result Date: 09/29/2020 CLINICAL DATA:  Chest pain.  Acute neurologic deficit. EXAM: PORTABLE CHEST 1 VIEW COMPARISON:  05/11/2020 FINDINGS: Stable mild cardiomegaly and pulmonary vascular congestion. No evidence of pulmonary infiltrate or edema. No evidence of pleural effusion. IMPRESSION: Stable mild cardiomegaly and pulmonary vascular congestion. No acute findings. Electronically Signed   By: Marlaine Hind M.D.   On: 09/29/2020 12:47     Labs: BNP (last 3 results) Recent Labs    11/27/19 1618  BNP 88.4   Basic Metabolic Panel: Recent Labs  Lab 09/29/20 1228 09/29/20 1253 09/30/20 0440  NA 140 142 138  K 3.8 3.8 4.3  CL 107 105 104  CO2 24  --  25  GLUCOSE 97 96 93  BUN $Re'13 15 16  'knR$ CREATININE 0.86 0.90 0.84  CALCIUM 9.7  --  9.4   Liver Function Tests: Recent Labs  Lab 09/29/20 1228 09/30/20 0440  AST 14* 22  ALT 13 14  ALKPHOS 173* 161*  BILITOT 0.2* 0.8  PROT 7.0 6.7  ALBUMIN 3.2* 3.0*   No results for input(s): LIPASE, AMYLASE in the last 168 hours. No results for input(s): AMMONIA in the last 168 hours. CBC: Recent Labs  Lab 09/29/20 1228 09/29/20 1253 09/30/20 0440  WBC 6.2  --  5.5  NEUTROABS 3.7  --   --   HGB 10.7* 11.2* 10.3*  HCT 35.4* 33.0* 33.9*  MCV 83.5  --  83.9  PLT 268  --  246   Cardiac Enzymes: No results for input(s): CKTOTAL, CKMB, CKMBINDEX, TROPONINI in the last 168 hours. BNP: Invalid input(s): POCBNP CBG: Recent Labs  Lab 09/30/20 0831 09/30/20 1329  GLUCAP 78 102*   D-Dimer Recent Labs    09/30/20 1500  DDIMER 0.39   Hgb A1c No results for input(s): HGBA1C in the last 72 hours. Lipid Profile No results for input(s):  CHOL, HDL, LDLCALC, TRIG, CHOLHDL, LDLDIRECT in the last 72 hours. Thyroid function studies No results for input(s): TSH, T4TOTAL, T3FREE, THYROIDAB in the last 72 hours.  Invalid input(s): FREET3 Anemia work up No results for input(s): VITAMINB12, FOLATE, FERRITIN, TIBC, IRON, RETICCTPCT in the last 72 hours. Urinalysis    Component Value Date/Time   COLORURINE COLORLESS (A)  09/29/2020 [place] 09/29/2020 1228   LABSPEC 1.006 09/29/2020 1228   PHURINE 5.0 09/29/2020 [place] 09/29/2020 1228   HGBUR NEGATIVE 09/29/2020 1228   BILIRUBINUR NEGATIVE 09/29/2020 1228   KETONESUR NEGATIVE 09/29/2020 1228   PROTEINUR NEGATIVE 09/29/2020 1228   NITRITE NEGATIVE 09/29/2020 1228   LEUKOCYTESUR NEGATIVE 09/29/2020 1228   Sepsis Labs Invalid input(s): PROCALCITONIN,  WBC,  LACTICIDVEN Microbiology Recent Results (from the past 240 hour(s))  Resp panel by RT-PCR (RSV, Flu A&B, Covid) Nasopharyngeal Swab     Status: None   Collection Time: 09/29/20  6:24 PM   Specimen: Nasopharyngeal Swab; Nasopharyngeal(NP) swabs in vial transport medium  Result Value Ref Range Status   SARS Coronavirus 2 by RT PCR NEGATIVE NEGATIVE Final    Comment: (NOTE) SARS-CoV-2 target nucleic acids are NOT DETECTED.  The SARS-CoV-2 RNA is generally detectable in upper respiratory specimens during the acute phase of infection. The lowest concentration of SARS-CoV-2 viral copies this assay can detect is 138 copies/mL. A negative result does not preclude SARS-Cov-2 infection and should not be used as the sole basis for treatment or other patient management decisions. A negative result may occur with  improper specimen collection/handling, submission of specimen other than nasopharyngeal swab, presence of viral mutation(s) within the areas targeted by this assay, and inadequate number of viral copies(<138 copies/mL). A negative result must be combined with clinical observations,  patient history, and epidemiological information. The expected result is Negative.  Fact Sheet for Patients:  EntrepreneurPulse.com.au  Fact Sheet for Healthcare Providers:  IncredibleEmployment.be  This test is no t yet approved or cleared by the [place] FDA and  has been authorized for detection and/or diagnosis of SARS-CoV-2 by FDA under an Emergency Use Authorization (EUA). This EUA will remain  in effect (meaning this test can be used) for the duration of the COVID-19 declaration under Section 564(b)(1) of the Act, 21 U.S.C.section 360bbb-3(b)(1), unless the authorization is terminated  or revoked sooner.       Influenza A by PCR NEGATIVE NEGATIVE Final   Influenza B by PCR NEGATIVE NEGATIVE Final    Comment: (NOTE) The Xpert Xpress SARS-CoV-2/FLU/RSV plus assay is intended as an aid in the diagnosis of influenza from Nasopharyngeal swab specimens and should not be used as a sole basis for treatment. Nasal washings and aspirates are unacceptable for Xpert Xpress SARS-CoV-2/FLU/RSV testing.  Fact Sheet for Patients: EntrepreneurPulse.com.au  Fact Sheet for Healthcare Providers: IncredibleEmployment.be  This test is not yet approved or cleared by the [place] FDA and has been authorized for detection and/or diagnosis of SARS-CoV-2 by FDA under an Emergency Use Authorization (EUA). This EUA will remain in effect (meaning this test can be used) for the duration of the COVID-19 declaration under Section 564(b)(1) of the Act, 21 U.S.C. section 360bbb-3(b)(1), unless the authorization is terminated or revoked.     Resp Syncytial Virus by PCR NEGATIVE NEGATIVE Final    Comment: (NOTE) Fact Sheet for Patients: EntrepreneurPulse.com.au  Fact Sheet for Healthcare Providers: IncredibleEmployment.be  This test is not yet approved or cleared by the [place] FDA and has been authorized for detection and/or diagnosis of SARS-CoV-2 by FDA under an Emergency Use Authorization (EUA). This EUA will remain in effect (meaning this test can be used) for the duration of the COVID-19 declaration under Section 564(b)(1) of the Act, 21 U.S.C. section 360bbb-3(b)(1), unless the authorization is terminated or revoked.  Performed at [facility], [place]  [address], [place], [place] [address]      Time coordinating discharge: 25 minutes  SIGNED: Antonieta Pert, MD  [facility] 10/01/2020, 1:37 PM  If 7PM-7AM, please contact night-coverage [email or web address]

## 2020-09-30 NOTE — ED Notes (Signed)
Lunch Tray Ordered @ 1033. 

## 2020-09-30 NOTE — Progress Notes (Signed)
Occupational Therapy Evaluation Patient Details Name: Meghan [name] MRN: [id number] DOB: Jan 27, 1966 Today's Date: 09/30/2020    History of Present Illness 55 y.o. female with medical history significant of asthma, CHF, history of PE, A. fib on Xarelto, sleep apnea, seizure, CVA, diabetes, Macular degeneration L eye, obesity, depression who presents with chest pain and left-sided weakness. MRI of head, neck and chest negative.   Clinical Impression   PTA pt lives alone and is independent with ADL and mobility. Goes to the gym 2x/wk. Is involved in individual and group counseling 1x/wk. Presents with inconsistent presentation in ability to use LU/LE functionally as noted below. + Hoover's sign.  At end of session pt began talking about her life stressors.Given functional level of performance recommend HHOT and intermittent S  - pending progress. Recommend psych consult. Will follow acutely.     Follow Up Recommendations  Home health OT;Supervision - Intermittent    Equipment Recommendations  Tub/shower seat (pt to get on her own)    Recommendations for Other Services Other (comment) (Psych)     Precautions / Restrictions Precautions Precautions: Fall Restrictions Weight Bearing Restrictions: No      Mobility Bed Mobility Overal bed mobility: Needs Assistance Bed Mobility: Supine to Sit;Sit to Supine     Supine to sit: Min guard Sit to supine: Min guard   General bed mobility comments: Min guard for safety. Pt requiring increased time for LLE movement, but did not require physical assist.    Transfers Overall transfer level: Needs assistance Equipment used: Rolling walker (2 wheeled) Transfers: Sit to/from Stand Sit to Stand: Min guard;+2 safety/equipment         General transfer comment: Min guard for safety. Cues for hand placement.    Balance Overall balance assessment: Mild deficits observed, not formally tested Sitting-balance support: No upper extremity  supported Sitting balance-Leahy Scale: Fair     Standing balance support: Bilateral upper extremity supported Standing balance-Leahy Scale: Poor Standing balance comment: Reliant on BUE support                           ADL either performed or assessed with clinical judgement   ADL Overall ADL's : Needs assistance/impaired Eating/Feeding: Modified independent   Grooming: Set up;Sitting   Upper Body Bathing: Set up;Sitting   Lower Body Bathing: Minimal assistance;Sit to/from stand   Upper Body Dressing : Minimal assistance;Sitting   Lower Body Dressing: Minimal assistance;Sit to/from stand   Toilet Transfer: RW;+2 for safety/equipment;Min guard   Toileting- Water quality scientist and Hygiene: Min guard       Functional mobility during ADLs: Minimal assistance;Rolling walker;Cueing for safety       Vision Baseline Vision/History: No visual deficits       Perception Perception Comments: WFL   Praxis Praxis Praxis tested?: Within functional limits    Pertinent Vitals/Pain Pain Assessment: 0-10 Pain Score: 10-Worst pain ever Pain Location: R knee and chest Pain Descriptors / Indicators: Aching Pain Intervention(s): Limited activity within patient's tolerance     Hand Dominance Right   Extremity/Trunk Assessment Upper Extremity Assessment Upper Extremity Assessment: LUE deficits/detail LUE Deficits / Details: inconsistent useof LUE. Difficulty moving off bed, however able to hold in extension overhead wihtout hitting face; able to use at times, unable to use at other times LUE Coordination: decreased fine motor;decreased gross motor   Lower Extremity Assessment Lower Extremity Assessment: Defer to PT evaluation (+ Hoover's sign) LLE Deficits / Details: Pt reporting LLE weakness.  Inconsistent presentation throughout and had positive Hoover's sign.   Cervical / Trunk Assessment Cervical / Trunk Assessment: Other exceptions (morbid obesity)    Communication Communication Communication: No difficulties   Cognition Arousal/Alertness: Awake/alert Behavior During Therapy: WFL for tasks assessed/performed Overall Cognitive Status: No family/caregiver present to determine baseline cognitive functioning                                 General Comments: cogntion appeasr WFL; pt began discussing life stressors; states she has virtual counseling every Monday adn goes to virtual group counseling on Thursdays   General Comments       Exercises     Shoulder Instructions      Home Living Family/patient expects to be discharged to:: Private residence Living Arrangements: Alone Available Help at Discharge: Available 24 hours/day Type of Home: Apartment Home Access: Level entry     Home Layout: One level     Bathroom Shower/Tub: Teacher, early years/pre: Standard Bathroom Accessibility: Yes How Accessible: Accessible via walker Home Equipment: None          Prior Functioning/Environment Level of Independence: Independent        Comments: I with IADL tasks; goes to [employer] 2x/wk        OT Problem List: Decreased strength;Decreased range of motion;Impaired balance (sitting and/or standing);Decreased coordination;Decreased safety awareness;Decreased knowledge of use of DME or AE;Obesity;Pain      OT Treatment/Interventions: Self-care/ADL training;Therapeutic exercise;Neuromuscular education;DME and/or AE instruction;Therapeutic activities;Patient/family education;Balance training    OT Goals(Current goals can be found in the care plan section) Acute Rehab OT Goals Patient Stated Goal: to get better OT Goal Formulation: With patient Time For Goal Achievement: 10/14/20 Potential to Achieve Goals: Good  OT Frequency:     Barriers to D/C:            Co-evaluation PT/OT/SLP Co-Evaluation/Treatment: Yes Reason for Co-Treatment: Necessary to address cognition/behavior during functional  activity;For patient/therapist safety;To address functional/ADL transfers PT goals addressed during session: Mobility/safety with mobility;Balance OT goals addressed during session: ADL's and self-care      AM-PAC OT "6 Clicks" Daily Activity     Outcome Measure Help from another person eating meals?: None Help from another person taking care of personal grooming?: A Little Help from another person toileting, which includes using toliet, bedpan, or urinal?: A Little Help from another person bathing (including washing, rinsing, drying)?: A Little Help from another person to put on and taking off regular upper body clothing?: A Little Help from another person to put on and taking off regular lower body clothing?: A Little 6 Click Score: 19   End of Session Equipment Utilized During Treatment: Gait belt;Rolling walker Nurse Communication: Mobility status;Other (comment) (DC needs)  Activity Tolerance: Patient tolerated treatment well Patient left: in bed;with call bell/phone within reach  OT Visit Diagnosis: Unsteadiness on feet (R26.81);Other abnormalities of gait and mobility (R26.89);Muscle weakness (generalized) (M62.81);Pain;Hemiplegia and hemiparesis Hemiplegia - Right/Left: Left Hemiplegia - dominant/non-dominant: Non-Dominant Hemiplegia - caused by: Unspecified Pain - Right/Left:  (knee) Pain - part of body: Knee                Time: 4132-4401 OT Time Calculation (min): 28 min Charges:  OT General Charges $OT Visit: 1 Visit OT Evaluation $OT Eval Moderate Complexity: [place], OT/L   Acute OT Clinical Specialist [place] Pager [phone] Office [phone]   [facility] 09/30/2020,  3:36 PM

## 2020-09-30 NOTE — Care Management (Signed)
Patient does not have home health set up yet, the offices are closed at this time. Will follow up with patient to obtain home health in the AM

## 2020-09-30 NOTE — ED Notes (Signed)
RN paged neurologist about pt L sided deficits

## 2020-09-30 NOTE — Progress Notes (Signed)
PT Cancellation Note  Patient Details Name: ROSALVA NEARY MRN: [id number] DOB: 09/12/1966   Cancelled Treatment:    Reason Eval/Treat Not Completed: Other (comment) Per MD, request to wait until after MRI of chest to complete PT eval. Pt still awaiting MRI. Will follow up as schedule allows.   Reuel Derby, PT, DPT  Acute Rehabilitation Services  Pager: [phone] Office: [phone]    Rudean Hitt 09/30/2020, 10:08 AM

## 2020-10-01 NOTE — TOC Transition Note (Signed)
Transition of Care [facility]) - CM/SW Discharge Note   Patient Details  Name: Meghan [name] MRN: [id number] Date of Birth: December 10, 1965  [facility] CM/SW Contact:  Verdell Carmine, RN Phone Number: 10/01/2020, 10:53 AM   Clinical Narrative:     Secured HH PT and OT through [place]- spoke to [employer].Calling patient to let her know.         Patient Goals and CMS Choice        Discharge Placement                       Discharge Plan and Services                                     Social Determinants of Health (SDOH) Interventions     Readmission Risk Interventions No flowsheet data found.

## 2020-10-03 ENCOUNTER — Ambulatory Visit (INDEPENDENT_AMBULATORY_CARE_PROVIDER_SITE_OTHER): Payer: Medicare Other | Admitting: Licensed Clinical Social Worker

## 2020-10-03 ENCOUNTER — Other Ambulatory Visit: Payer: Self-pay

## 2020-10-03 DIAGNOSIS — F411 Generalized anxiety disorder: Secondary | ICD-10-CM

## 2020-10-03 DIAGNOSIS — F4310 Post-traumatic stress disorder, unspecified: Secondary | ICD-10-CM | POA: Diagnosis not present

## 2020-10-03 DIAGNOSIS — F331 Major depressive disorder, recurrent, moderate: Secondary | ICD-10-CM

## 2020-10-03 NOTE — Progress Notes (Signed)
Virtual Visit via Video Note   I connected with Meghan [name] on 10/03/20 at 10:00am by video enabled telemedicine application and verified that I am speaking with the correct person using two identifiers.   I discussed the limitations, risks, security and privacy concerns of performing an evaluation and management service by video and the availability of in person appointments. I also discussed with the patient that there may be a patient responsible charge related to this service. The patient expressed understanding and agreed to proceed.   I discussed the assessment and treatment plan with the patient. The patient was provided an opportunity to ask questions and all were answered. The patient agreed with the plan and demonstrated an understanding of the instructions.   The patient was advised to call back or seek an in-person evaluation if the symptoms worsen or if the condition fails to improve as anticipated.   I provided 1 hour of non-face-to-face time during this encounter.     [name], LCSW, LCAS _______________________ THERAPIST PROGRESS NOTE   Session Time: 10:00am - 11:00am  Location: Patient: Patient Home Provider: [place] Office    Participation Level: Active    Behavioral Response: Alert, casually dressed, euthymic mood/affect   Type of Therapy:  Individual Therapy   Treatment Goals addressed: Anxiety/depression management; medication management; Keeping up with medical appointments to maintain physical health; Maintaining healthy boundaries   Interventions: CBT, healthy boundaries and relationships   Summary: Meghan [name] is a 55 year old divorced African American female that presented for virtual appointment today with diagnoses of Major Depressive Disorder, recurrent, moderate, Generalized Anxiety Disorder and PTSD.      Suicidal/Homicidal: None; without plan or intent    Therapist Response:  Clinician met with Meghan [name] for virtual therapy session and assessed for  safety, sobriety, and medication compliance.  Meghan [name] presented for today's appointment on time and was alert, oriented x5, with no evidence or self-report of SI/HI or A/V H.  Meghan [name] reported ongoing compliance with medication and denied any use of alcohol or illicit substances.  Clinician inquired about Meghan [name]'s emotional ratings today, as well as any significant changes in thoughts, feelings, or behavior since last check-in.  Meghan [name] reported scores of 0/10 for depression, anxiety, and anger.  She denied any reoccurrence of panic attacks.  Clinician inquired about Meghan [name]'s successes and struggles over last week.  Meghan [name] reported that she was admitted to the hospital on Thursday of last week when she began to experience chest pain and weakness in her left side, and the doctors suggested that this was stress related and she should continue with therapy.  Clinician inquired about recent stressors that Meghan [name] has been facing which could be contributing to these physical symptoms, as well as any attempts to cope.  Meghan [name] reported that she has begun talking to a female friend again that previously caused her a great deal of stress due to his inconsistent behavior, and he outreached her about making plans, only to back out at the last minute again last week. Meghan [name] stated "I let him push my buttons, get angry, and then I don't want to be around anybody because I'm hurt".  Clinician reminded Meghan [name] of importance in enforcing boundaries appropriately with individuals that can be triggering for depression and/or anxiety, given similar experiences that she had in the past with family members.  Clinician also discussed red flag behaviors to be mindful of in others that could indicate that more rigid boundaries are warranted, including attempts to isolate, creating dependence, disrespect,  dishonesty, controlling, etc.  Interventions were effective, as evidenced by Meghan [name] reporting that there have been several red flags apparent so far, which  have led her to block his number twice and reduce contact greatly in favor of more positive supports, so she will be cautious moving forward, stating "I have to think long and hard to decide whats best for me.  It will need to be on my terms if me and him talk anymore".  Clinician will continue to monitor.                                          Plan: Follow up again in 1 week virtually.    Diagnosis: Major depressive disorder, recurrent, moderate; Generalized Anxiety Disorder; PTSD    [name], LCSW, LCAS 10/03/20

## 2020-10-10 ENCOUNTER — Other Ambulatory Visit: Payer: Self-pay

## 2020-10-10 ENCOUNTER — Ambulatory Visit (INDEPENDENT_AMBULATORY_CARE_PROVIDER_SITE_OTHER): Payer: Medicare Other | Admitting: Licensed Clinical Social Worker

## 2020-10-10 DIAGNOSIS — F4310 Post-traumatic stress disorder, unspecified: Secondary | ICD-10-CM | POA: Diagnosis not present

## 2020-10-10 DIAGNOSIS — F331 Major depressive disorder, recurrent, moderate: Secondary | ICD-10-CM

## 2020-10-10 DIAGNOSIS — F411 Generalized anxiety disorder: Secondary | ICD-10-CM | POA: Diagnosis not present

## 2020-10-10 NOTE — Progress Notes (Signed)
Virtual Visit via Video Note   I connected with Meghan [name] on 10/10/20 at 10:00am by video enabled telemedicine application and verified that I am speaking with the correct person using two identifiers.   I discussed the limitations, risks, security and privacy concerns of performing an evaluation and management service by video and the availability of in person appointments. I also discussed with the patient that there may be a patient responsible charge related to this service. The patient expressed understanding and agreed to proceed.   I discussed the assessment and treatment plan with the patient. The patient was provided an opportunity to ask questions and all were answered. The patient agreed with the plan and demonstrated an understanding of the instructions.   The patient was advised to call back or seek an in-person evaluation if the symptoms worsen or if the condition fails to improve as anticipated.   I provided 34 minutes of non-face-to-face time during this encounter.     Cory Bates, LCSW, LCAS _______________________ THERAPIST PROGRESS NOTE   Session Time: 10:00am - 10:34am  Location: Patient: Patient Home Provider: [facility] Office    Participation Level: Active    Behavioral Response: Alert, casually dressed, euthymic mood/affect   Type of Therapy:  Individual Therapy   Treatment Goals addressed: Anxiety/depression management; medication management; Keeping up with medical appointments to maintain physical health; Working on completion of novel; Maintaining healthy boundaries   Interventions: CBT, SMART goals   Summary: Meghan [name] is a 54 year old divorced African American female that presented for virtual appointment today with diagnoses of Major Depressive Disorder, recurrent, moderate, Generalized Anxiety Disorder and PTSD.      Suicidal/Homicidal: None; without plan or intent    Therapist Response:  Clinician met with [name] for virtual therapy appointment and  assessed for safety, sobriety, and medication compliance.  Meghan [name] presented for today's session on time and was alert, oriented x5, with no evidence or self-report of SI/HI or A/V H.  Meghan [name] reported that she continues taking medication as prescribed and denied any use of alcohol or illicit substances.  Clinician inquired about Meghan [name]'s current emotional ratings, as well as any significant changes in thoughts, feelings, or behavior since previous check-in.  Meghan [name] reported scores of 0/10 for depression, anxiety, and anger, which is consistent with previous check-in. Meghan [name] denied any experiencing any panic attacks.  Clinician inquired about Meghan [name]'s successes and struggles over past week.  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  She reported that she also attended a followup appointment to prepare for upcoming surgery, which will require some time for healing, and could impact her ability to type as efficiently.  Meghan [name] reported that she spoke with her publisher last Thursday, and needs to complete her book by the end of the month to meet new deadline.  Meghan [name] reported that this is her main challenge at this time since she has had to postpone completing the novel for awhile, and does not want further setbacks.  Clinician assisted Meghan [name] with preparing for this challenge by applying SMART goal template in order to ensure that her objective is Specific, Measurable, Achievable, Relevant, and Time-bound so that her chances of successful novel completion are improved.  Meghan [name] was receptive to this approach, and reported that she feels confident she can finish proofreading/revising the rough draft of her novel by 10/17/20 if she spends 2 hours per   day in the early morning working specifically on this. Meghan [name] reported that she would also inform friends and family about her blocked out time schedule for writing  each day in order to ensure work boundary is in place with no chance of interruption.  Interventions were effective, as evidenced by Meghan [name] reporting that she feels more confident that she can achieve her novel completion goal in time to submit for publishing, and stating "I feel good about this.  You were the one who originally encouraged me to pick it back up and now I just have to push a little bit more before I'll have it all done".  Clinician will continue to monitor.                                          Plan: Follow up again in 1 week virtually.    Diagnosis: Major depressive disorder, recurrent, moderate; Generalized Anxiety Disorder; PTSD    Meghan Flood, LCSW, LCAS 10/10/20

## 2020-10-13 NOTE — Progress Notes (Signed)
[facility] [phone] - Meghan [name], [place] - [address] AT [address] [place] [address] Phone: [phone] Fax: [phone]  [id number] - [place], [place] [address] Phone: [phone] Fax: [phone]      Your procedure is scheduled on February 1  Report to [facility] Main Entrance "A" at 1200 P.M., and check in at the Admitting office.  Call this number if you have problems the morning of surgery:  [phone]  Call [phone] if you have any questions prior to your surgery date Monday-Friday 8am-4pm    Remember:  Do not eat after midnight the night before your surgery  You may drink clear liquids until 1100 am the morning of your surgery.   Clear liquids allowed are: Water, Non-Citrus Juices (without pulp), Carbonated Beverages, Clear Tea, Black Coffee Only, and Gatorade   Enhanced Recovery after Surgery for Orthopedics Enhanced Recovery after Surgery is a protocol used to improve the stress on your body and your recovery after surgery.  Patient Instructions  . The night before surgery:  o No food after midnight. ONLY clear liquids after midnight  . The day of surgery (if you have diabetes): o Drink ONE (1) small bottle of water by _1100 am____ the morning of surgery o This drink was given to you during your hospital  pre-op appointment visit.  o Nothing else to drink after completing the  Small bottle of water.         If you have questions, please contact your surgeon's office.     Take these medicines the morning of surgery with A SIP OF WATER  acetaminophen (TYLENOL if needed albuterol (VENTOLIN HFA)  If needed, Please bring all inhalers with you the day of surgery.  BREZTRI AEROSPHERE  Inhaler Eye drops if needed flecainide (TAMBOCOR) omeprazole (PRILOSEC)  tiZANidine (ZANAFLEX)  Follow your surgeon's instructions on when to stop  rivaroxaban (XARELTO).  If no instructions were given by your surgeon then you will need to call the office to get those instructions.    As of today, STOP taking any Aspirin (unless otherwise instructed by your surgeon) Aleve, Naproxen, Ibuprofen, Motrin, Advil, Goody's, BC's, all herbal medications, fish oil, and all vitamins.   WHAT DO I DO ABOUT MY DIABETES MEDICATION?   Marland Kitchen Do not take oral diabetes medicines (pills) the morning of surgery. metFORMIN (GLUCOPHAGE   HOW TO MANAGE YOUR DIABETES BEFORE AND AFTER SURGERY  Why is it important to control my blood sugar before and after surgery? . Improving blood sugar levels before and after surgery helps healing and can limit problems. . A way of improving blood sugar control is eating a healthy diet by: o  Eating less sugar and carbohydrates o  Increasing activity/exercise o  Talking with your doctor about reaching your blood sugar goals . High blood sugars (greater than 180 mg/dL) can raise your risk of infections and slow your recovery, so you will need to focus on controlling your diabetes during the weeks before surgery. . Make sure that the doctor who takes care of your diabetes knows about your planned surgery including the date and location.  How do I manage my blood sugar before surgery? . Check your blood sugar at least 4 times a day, starting 2 days before surgery, to make sure that the level is not too high or low. . Check your blood sugar the morning of your surgery when you wake up  and every 2 hours until you get to the Short Stay unit. o If your blood sugar is less than 70 mg/dL, you will need to treat for low blood sugar: - Do not take insulin. - Treat a low blood sugar (less than 70 mg/dL) with  cup of clear juice (cranberry or apple), 4 glucose tablets, OR glucose gel. - Recheck blood sugar in 15 minutes after treatment (to make sure it is greater than 70 mg/dL). If your blood sugar is not greater than 70 mg/dL on  recheck, call [phone] for further instructions. . Report your blood sugar to the short stay nurse when you get to Short Stay.  . If you are admitted to the hospital after surgery: o Your blood sugar will be checked by the staff and you will probably be given insulin after surgery (instead of oral diabetes medicines) to make sure you have good blood sugar levels. o The goal for blood sugar control after surgery is 80-180 mg/dL.                    Do not wear jewelry, make up, or nail polish            Do not wear lotions, powders, perfumes/colognes, or deodorant.            Do not shave 48 hours prior to surgery.  Men may shave face and neck.            Do not bring valuables to the hospital.            [facility] is not responsible for any belongings or valuables.  Do NOT Smoke (Tobacco/Vaping) or drink Alcohol 24 hours prior to your procedure If you use a CPAP at night, you may bring all equipment for your overnight stay.   Contacts, glasses, dentures or bridgework may not be worn into surgery.      For patients admitted to the hospital, discharge time will be determined by your treatment team.   Patients discharged the day of surgery will not be allowed to drive home, and someone needs to stay with them for 24 hours.    Special instructions:   [place]- Preparing For Surgery  Before surgery, you can play an important role. Because skin is not sterile, your skin needs to be as free of germs as possible. You can reduce the number of germs on your skin by washing with CHG (chlorahexidine gluconate) Soap before surgery.  CHG is an antiseptic cleaner which kills germs and bonds with the skin to continue killing germs even after washing.    Oral Hygiene is also important to reduce your risk of infection.  Remember - BRUSH YOUR TEETH THE MORNING OF SURGERY WITH YOUR REGULAR TOOTHPASTE  Please do not use if you have an allergy to CHG or antibacterial soaps. If your skin becomes  reddened/irritated stop using the CHG.  Do not shave (including legs and underarms) for at least 48 hours prior to first CHG shower. It is OK to shave your face.  Please follow these instructions carefully.   1. Shower the NIGHT BEFORE SURGERY and the MORNING OF SURGERY with CHG Soap.   2. If you chose to wash your hair, wash your hair first as usual with your normal shampoo.  3. After you shampoo, rinse your hair and body thoroughly to remove the shampoo.  4. Use CHG as you would any other liquid soap. You can apply CHG directly to the skin  and wash gently with a scrungie or a clean washcloth.   5. Apply the CHG Soap to your body ONLY FROM THE NECK DOWN.  Do not use on open wounds or open sores. Avoid contact with your eyes, ears, mouth and genitals (private parts). Wash Face and genitals (private parts)  with your normal soap.   6. Wash thoroughly, paying special attention to the area where your surgery will be performed.  7. Thoroughly rinse your body with warm water from the neck down.  8. DO NOT shower/wash with your normal soap after using and rinsing off the CHG Soap.  9. Pat yourself dry with a CLEAN TOWEL.  10. Wear CLEAN PAJAMAS to bed the night before surgery  11. Place CLEAN SHEETS on your bed the night of your first shower and DO NOT SLEEP WITH PETS.   Day of Surgery: Wear Clean/Comfortable clothing the morning of surgery Do not apply any deodorants/lotions.   Remember to brush your teeth WITH YOUR REGULAR TOOTHPASTE.   Please read over the following fact sheets that you were given.

## 2020-10-14 ENCOUNTER — Encounter (HOSPITAL_COMMUNITY)
Admission: RE | Admit: 2020-10-14 | Discharge: 2020-10-14 | Disposition: A | Discharge status: Home / Self Care | Payer: Medicare Other | Source: Ambulatory Visit | Attending: Orthopedic Surgery | Admitting: Orthopedic Surgery

## 2020-10-14 ENCOUNTER — Other Ambulatory Visit (HOSPITAL_COMMUNITY)
Admission: RE | Admit: 2020-10-14 | Discharge: 2020-10-14 | Disposition: A | Discharge status: Home / Self Care | Payer: Medicare Other | Source: Ambulatory Visit | Attending: Orthopedic Surgery | Admitting: Orthopedic Surgery

## 2020-10-14 ENCOUNTER — Other Ambulatory Visit: Payer: Self-pay

## 2020-10-14 ENCOUNTER — Encounter (HOSPITAL_COMMUNITY): Payer: Self-pay

## 2020-10-14 DIAGNOSIS — Z20822 Contact with and (suspected) exposure to covid-19: Secondary | ICD-10-CM | POA: Insufficient documentation

## 2020-10-14 DIAGNOSIS — Z01812 Encounter for preprocedural laboratory examination: Secondary | ICD-10-CM | POA: Insufficient documentation

## 2020-10-14 HISTORY — DX: Depression, unspecified: F32.A

## 2020-10-14 HISTORY — DX: Prediabetes: R73.03

## 2020-10-14 HISTORY — DX: Gastro-esophageal reflux disease without esophagitis: K21.9

## 2020-10-14 LAB — SARS CORONAVIRUS 2 (TAT 6-24 HRS): SARS Coronavirus 2: NEGATIVE

## 2020-10-14 LAB — BASIC METABOLIC PANEL
Anion gap: 10 (ref 5–15)
BUN: 18 mg/dL (ref 6–20)
CO2: 26 mmol/L (ref 22–32)
Calcium: 10.3 mg/dL (ref 8.9–10.3)
Chloride: 104 mmol/L (ref 98–111)
Creatinine, Ser: 0.94 mg/dL (ref 0.44–1.00)
GFR, Estimated: >60 mL/min (ref >60)
Glucose, Bld: 97 mg/dL (ref 70–99)
Potassium: 4 mmol/L (ref 3.5–5.1)
Sodium: 140 mmol/L (ref 135–145)

## 2020-10-14 LAB — CBC
HCT: 37 % (ref 36.0–46.0)
Hemoglobin: 11.5 g/dL — ABNORMAL LOW (ref 12.0–15.0)
MCH: 25.8 pg — ABNORMAL LOW (ref 26.0–34.0)
MCHC: 31.1 g/dL (ref 30.0–36.0)
MCV: 83 fL (ref 80.0–100.0)
Platelets: 292 10*3/uL (ref 150–400)
RBC: 4.46 MIL/uL (ref 3.87–5.11)
RDW: 14.9 % (ref 11.5–15.5)
WBC: 7.2 10*3/uL (ref 4.0–10.5)
nRBC: 0 % (ref 0.0–0.2)

## 2020-10-14 LAB — SURGICAL PCR SCREEN
MRSA, PCR: NEGATIVE
Staphylococcus aureus: NEGATIVE

## 2020-10-14 LAB — GLUCOSE, CAPILLARY: Glucose-Capillary: 112 mg/dL — ABNORMAL HIGH (ref 70–99)

## 2020-10-14 NOTE — Progress Notes (Signed)
PCP - Arneta Cliche, FNP-C, ([place] at [facility]) Cardiologist - Dr. Marton Redwood ([place] at [facility]) Neurologist- Vic Blackbird, MD ([facility]) [place], MD( [place] at [facility])  PPM/ICD - Denies  Chest x-ray - N/A EKG - 09/29/20 Stress Test - 2017 at [facility]; records requested ECHO - 04/28/19 Cardiac Cath - 2017 at [facility]; records requested  Sleep Study - Yes, 05/17/19 CPAP - Yes  Per pt, she is pre-diabetic, does not check CBGs. Per pt, [name], NP manages her pre-diabetes. Last A1C wa 5.1 10/10/20 at Office appt with [name], NP  Blood Thinner Instructions: Per pt, last dose Xarelto 10/11/20 Aspirin Instructions: N/A  ERAS Protcol - Yes; 10 oz Water given  COVID TEST- 10/14/20   Anesthesia review: Yes, cardiac, pulmonary, and neuro hx; last office notes requested from [name], NP  Patient denies shortness of breath, fever, cough and chest pain at PAT appointment   All instructions explained to the patient, with a verbal understanding of the material. Patient agrees to go over the instructions while at home for a better understanding. Patient also instructed to self quarantine after being tested for COVID-19. The opportunity to ask questions was provided.

## 2020-10-17 ENCOUNTER — Ambulatory Visit (INDEPENDENT_AMBULATORY_CARE_PROVIDER_SITE_OTHER): Payer: Medicare Other | Admitting: Licensed Clinical Social Worker

## 2020-10-17 ENCOUNTER — Other Ambulatory Visit: Payer: Self-pay

## 2020-10-17 DIAGNOSIS — F331 Major depressive disorder, recurrent, moderate: Secondary | ICD-10-CM

## 2020-10-17 DIAGNOSIS — F411 Generalized anxiety disorder: Secondary | ICD-10-CM | POA: Diagnosis not present

## 2020-10-17 DIAGNOSIS — F4310 Post-traumatic stress disorder, unspecified: Secondary | ICD-10-CM

## 2020-10-17 MED ORDER — DEXTROSE 5 % IV SOLN
3.0000 g | INTRAVENOUS | Status: DC
  Filled 2020-10-17: qty 3000

## 2020-10-17 NOTE — Anesthesia Preprocedure Evaluation (Addendum)
Anesthesia Evaluation  Patient identified by MRN, date of birth, ID band Patient awake    Reviewed: Allergy & Precautions, NPO status , Patient's Chart, lab work & pertinent test results  History of Anesthesia Complications Negative for: history of anesthetic complications  Airway Mallampati: II  TM Distance: >3 FB Neck ROM: Full    Dental no notable dental hx.    Pulmonary asthma , sleep apnea and Continuous Positive Airway Pressure Ventilation ,    Pulmonary exam normal        Cardiovascular +CHF  Normal cardiovascular exam+ dysrhythmias Atrial Fibrillation      Neuro/Psych Seizures - (nonepileptogenic),  Depression CVA (1989, 1995, left weakness), Residual Symptoms    GI/Hepatic Neg liver ROS, hiatal hernia, GERD  Controlled and Medicated,  Endo/Other  diabetes, Type 2  Renal/GU negative Renal ROS  negative genitourinary   Musculoskeletal negative musculoskeletal ROS (+)   Abdominal   Peds  Hematology negative hematology ROS (+)   Anesthesia Other Findings Day of surgery medications reviewed with patient.  Reproductive/Obstetrics negative OB ROS                            Anesthesia Physical Anesthesia Plan  ASA: IV  Anesthesia Plan: MAC and Regional   Post-op Pain Management:    Induction:   PONV Risk Score and Plan: 2 and Propofol infusion, Treatment may vary due to age or medical condition, Midazolam and Ondansetron  Airway Management Planned: Natural Airway and Simple Face Mask  Additional Equipment: None  Intra-op Plan:   Post-operative Plan:   Informed Consent: I have reviewed the patients History and Physical, chart, labs and discussed the procedure including the risks, benefits and alternatives for the proposed anesthesia with the patient or authorized representative who has indicated his/her understanding and acceptance.       Plan Discussed with:  CRNA  Anesthesia Plan Comments: (PAT note written 10/17/2020 by Myra Gianotti, PA-C. )      Anesthesia Quick Evaluation

## 2020-10-17 NOTE — Progress Notes (Addendum)
Anesthesia Chart Review:  Case: [id number] Date/Time: 10/18/20 1345   Procedures:      LEFT WRIST ARTHROSCOPY WITH DEBRIDEMENT (Left Wrist) - AXILLARY BLOCK     BONE GRAFTING OF ENCHONDROMA MIDDLE PHANLANX OF LEFT MIDDLE FINGER (Left ) - AXILLARY BLOCK   Anesthesia type: Regional   Pre-op diagnosis: LEFT WRIST SCAPHO LUNATE TEAR TRIANGULAR FIBROCARTILAGE TEAR,LEFT MIDDLE FINGER ENCHONDROMA OF MIDDLE Meghan [name]   Location: [place] OR ROOM 02 / [place] [facility]   Surgeons: Daryll Brod, MD      DISCUSSION: Patient is a 55 year old female scheduled for the above procedure.  History includes never smoker, asthma, afib, CHF, PE (IVC filter 2003), OSA (CPAP), pre-diabetes, seizure disorder (nonepileptic seizures), CVA (1989, 1995, left sided weakness), hypercholesterolemia, hiatal hernia, GERD, macular degeneration (left), depression. BMI is consistent with morbid obesity.  - 09/29/20-/10/02/19 admission for atypical chest pain with normal troponins and d-dimer, possible musculoskeletal etiology. + Hoover's sign. No acute infarct, no cervical spine stenosis, unremarkable brachial plexus on MRI. Neurology consulted and will follow out-patient for EMG.  Cardiologist Dr Brigitte Pulse cleared patient for this procedure stating, "Pt is 55 yo old female with prior h/o atrial fibrillation which is stable and remains in sinus rhythm on Flecainide 50 mg BID. Needs anticoagulation for h/o pulm embolism. STOP Xarelto 3 Days before surgery and resume Xarelto day after surgery." Awaiting records from [facility], but according to [facility] Cardiology notes her 04/2019 echo showed LVEF 60-65%, moderate TR and a reportedly had a normal coronary angiogram in ~ 2017 or 2018 at [facility]..  10/14/20 presurgical COVID-19 test was negative. Anesthesia team to evaluate on the day of surgery.    VS: BP (!) 115/56   Pulse 78   Temp 37 C (Oral)   Resp 18   Ht 5\' 5"  (1.651 m)   Wt (!) 143.4 kg   SpO2 98%   BMI  52.62 kg/m     PROVIDERS: Beverley Fiedler, FNP is PCP ([place]) - Marton Redwood, MD is cardiologist [facility]). Previously, she saw Celine Ahr, MD with [facility]. Moved to [place] from [place] about 2 years ago.  Gwenevere Ghazi, MD is pulmonologist [facility]). Previously saw Dionne Milo, MD with [facility]. Marcial Pacas, MD is neurologist [facility])   LABS: Labs reviewed: Acceptable for surgery. (all labs ordered are listed, but only abnormal results are displayed)  Labs Reviewed  GLUCOSE, CAPILLARY - Abnormal; Notable for the following components:      Result Value   Glucose-Capillary 112 (*)    All other components within normal limits  CBC - Abnormal; Notable for the following components:   Hemoglobin 11.5 (*)    MCH 25.8 (*)    All other components within normal limits  SURGICAL PCR SCREEN  BASIC METABOLIC PANEL     IMAGES: MR Chest 09/30/20: IMPRESSION: - Normal appearing brachial plexus. No evidence of denervation atrophy. - Moderate acromioclavicular osteoarthritis. - Rotator cuff tendinopathy. Please note this exam is not designed to evaluate the intrinsic structures of the shoulder.  MR C-spine 09/30/20: IMPRESSION: 1. No spinal canal or neural foraminal stenosis. 2. Reversal of normal cervical lordosis may be positional or due to muscle spasm.  MR Brain 09/29/20: IMPRESSION: No evidence of recent infarction, hemorrhage, or mass.  1V PCXR 09/29/20: IMPRESSION: Stable mild cardiomegaly and pulmonary vascular congestion. No acute findings.   EKG: 09/29/20: SR. Probable left atrial enlargement.  Low voltage, precordial leads.    CV:  UPDATE 10/17/20 4:17 PM: Received records from Dr. Brigitte Pulse. He indicated that cardiac cath was in 2014 (and not 2017/2018). He does not have a copy of the report either. He did get an echo on 08/16/20 that showed: " Moderate  concentric left ventricular hypertrophy with septum 1.4 cm and basal inferior lateral wall 1.3 cm.  Left atrium was mildly dilated at 38 mL/m with normal right ventricular size and function.  Ejection fraction was 65% with pulmonary artery pressure estimated at 32 mmHg."  According to 03/10/20 cardiology office note by Dr. Shirlee More ([place]): "Echocardiogram 04/2019. Normal LV EF 60-65%. Moderate 2+ tricuspid regurgitation.Marland KitchenMarland KitchenNo history of coronary artery disease; post normal coronary angiogram in 2018."  LLE venous US 03/28/20: Summary:  Left:  - No evidence of deep vein thrombosis.  - No evidence of superficial venous thrombosis.  - No evidence of superficial venous reflux in the greater saphenous vein.  - The anterior accessory vein branch is not competent.  - The small saphenous vein is not competent in the proximal segment.    Past Medical History:  Diagnosis Date  . Arthritis    left knee  . Asthma   . Atrial fibrillation ([place])   . Atrial fibrillation ([place]) 02/16/2019  . CHF (congestive heart failure) ([place]) 01/2019   Physician from [place] informed her she had this   . Depression   . Dysrhythmia    Atrial fibrillation  . GERD (gastroesophageal reflux disease)   . Hiatal hernia   . History of pulmonary embolus (PE)   . Hypercholesterolemia   . Macular degeneration    of left eye  . Osteoarthritis    knees  . Pneumonia    in the past  . Pre-diabetes   . Pulmonary embolism ([place])    bilat  . Seizure ([place])   . Seizure disorder ([place]) 2017  . Sleep apnea CPAP   [facility]  . Stroke [facility])    1989 and 1995 (left sided weakness)  . Vitamin D deficiency     Past Surgical History:  Procedure Laterality Date  . ABDOMINAL HYSTERECTOMY  2003   complete  . CARDIAC CATHETERIZATION  2017   in [facility] in [place]  . CESAREAN SECTION  [id number],[id number]   x 2  . CESAREAN SECTION     x 2  . CHOLECYSTECTOMY  2003  . COLONOSCOPY WITH PROPOFOL N/A 06/05/2019    Procedure: COLONOSCOPY WITH PROPOFOL;  Surgeon: Carol Ada, MD;  Location: [facility];  Service: Endoscopy;  Laterality: N/A;  . DIAGNOSTIC LAPAROSCOPY  2015; 2017   lap hernia repair x2  . HERNIA REPAIR  2992, 4268   umbilical hernia repair  . IVC FILTER INSERTION  2003   Hx pulmonary embolus  . POLYPECTOMY  06/05/2019   Procedure: POLYPECTOMY;  Surgeon: Carol Ada, MD;  Location: [facility];  Service: Endoscopy;;  . SHOULDER ARTHROSCOPY W/ ROTATOR CUFF REPAIR  08/28/2017   right shoulder    MEDICATIONS: . acetaminophen (TYLENOL) 500 MG tablet  . albuterol (VENTOLIN HFA) 108 (90 Base) MCG/ACT inhaler  . BREZTRI AEROSPHERE 160-9-4.8 MCG/ACT AERO  . Cholecalciferol (VITAMIN D-3) 25 MCG (1000 UT) CAPS  . cycloSPORINE (RESTASIS) 0.05 % ophthalmic emulsion  . flecainide (TAMBOCOR) 50 MG tablet  . furosemide (LASIX) 40 MG tablet  . metFORMIN (GLUCOPHAGE) 500 MG tablet  . Multiple Vitamins-Minerals (PRESERVISION AREDS 2 PO)  . Omega-3 Fatty Acids (FISH OIL) 1000 MG CAPS  . omeprazole (  PRILOSEC) 40 MG capsule  . OZEMPIC, 0.25 OR 0.5 MG/DOSE, 2 MG/1.5ML SOPN  . promethazine (PHENERGAN) 12.5 MG tablet  . rivaroxaban (XARELTO) 20 MG TABS tablet  . tiZANidine (ZANAFLEX) 4 MG tablet   No current facility-administered medications for this encounter.    Myra Gianotti, PA-C Surgical Short Stay/Anesthesiology [facility] Phone [phone] [facility] Phone [phone] 10/17/2020 10:09 AM

## 2020-10-17 NOTE — Progress Notes (Signed)
Virtual Visit via Video Note   I connected with Meghan [name] on 10/17/20 at 2:00pm by video enabled telemedicine application and verified that I am speaking with the correct person using two identifiers.   I discussed the limitations, risks, security and privacy concerns of performing an evaluation and management service by video and the availability of in person appointments. I also discussed with the patient that there may be a patient responsible charge related to this service. The patient expressed understanding and agreed to proceed.   I discussed the assessment and treatment plan with the patient. The patient was provided an opportunity to ask questions and all were answered. The patient agreed with the plan and demonstrated an understanding of the instructions.   The patient was advised to call back or seek an in-person evaluation if the symptoms worsen or if the condition fails to improve as anticipated.   I provided 1 hour of non-face-to-face time during this encounter.     Noralee Stain, LCSW, LCAS _______________________ THERAPIST PROGRESS NOTE   Session Time: 2:00pm - 3:00pm  Location: Patient: Patient Home Provider: [facility] OPT Office    Participation Level: Active    Behavioral Response: Alert, casually dressed, depressed mood/affect   Type of Therapy:  Individual Therapy   Treatment Goals addressed: Anxiety/depression management; medication management; Working on completion of novel; Maintaining healthy boundaries   Interventions: CBT, healthy boundaries within the family system   Summary: Meghan [name] is a 55 year old divorced African American female that presented for virtual appointment today with diagnoses of Major Depressive Disorder, recurrent, moderate, Generalized Anxiety Disorder and PTSD.      Suicidal/Homicidal: None; without plan or intent    Therapist Response:  Clinician met with Meghan [name] for virtual therapy session and assessed for safety, sobriety, and medication  compliance.  Meghan [name] presented for today's appointment on time and was alert, oriented x5, with no evidence or self-report of SI/HI or A/V H.  Meghan [name] reported ongoing compliance with medication and denied any use of alcohol or illicit substances.  Clinician inquired about Meghan [name]'s emotional ratings today, as well as any significant changes in thoughts, feelings, or behavior since last check-in.  Meghan [name] reported scores of 10/10 for depression, anxiety, and anger, noting that she has had 2 panic attacks in past week, including just a few minutes prior to meeting today.  Clinician inquired about stressors which have been influencing [name]'s mood and increase in panic episodes.  [name] reported that recently she has been trying to focus upon her book so she can finish it in time for publisher deadline, but her daughter has been asking her to assist with the grandson more often, and this resulted in an argument over the weekend between them when she pushed back, so now neither of them are in communication.  Meghan [name] stated "That hurts me because I'm used to talking to my grandson every day". Meghan [name] reported that this has also led to increased negative thoughts, especially in regard to thinking she is a bad mother or grandmother.  Clinician revisited discussion on managing appropriate boundaries with supports based upon their behavior and overall impact upon her mental health.  Clinician reminded Meghan [name] of previous instances when contact with her daughters negatively impacted her mood, and strategies that were implemented to improve the situation, including her original decision to move from [place] to focus more upon her wellbeing.  Meghan [name] reported that she does not feel like any of her children understand her mental health concerns, and worry more about  themselves, stating "I've tried to be the best mother I can be, but they talk to me like I'm crap.  They also turn my words around and makes it seem like I'm always the one in  the wrong".  Meghan [name] reported that reflecting upon previous living situation in [place] reinforced her original decision to move, stating "This is how it was when we lived together, its why I had to move, I was stressed all the time and it feels like my daughters are always in competition and putting me in the middle so I'm physically and mentally drained".  Interventions were effective, as evidenced by Meghan [name] reporting that she had previously considered cancelling todays appointment due to tendency to isolate when someone has upsets her, but was ultimately glad that she didn't since her affect and mood improved considerably, as well as ability to challenge negative thinking, stating "I know that I am not selfish and this made me feel a little better to talk about how I was feeling. I was second guessing myself, but everything will be alright".  Clinician will continue to monitor.                                          Plan: Follow up again in 1 week virtually.    Diagnosis: Major depressive disorder, recurrent, moderate; Generalized Anxiety Disorder; PTSD    Meghan Flood, LCSW, LCAS 10/17/20

## 2020-10-18 ENCOUNTER — Ambulatory Visit (HOSPITAL_COMMUNITY): Payer: Medicare Other | Admitting: Certified Registered Nurse Anesthetist

## 2020-10-18 ENCOUNTER — Encounter (HOSPITAL_COMMUNITY)
Admission: RE | Disposition: A | Discharge status: Home / Self Care | Payer: Self-pay | Source: Home / Self Care | Attending: Orthopedic Surgery

## 2020-10-18 ENCOUNTER — Other Ambulatory Visit: Payer: Self-pay

## 2020-10-18 ENCOUNTER — Ambulatory Visit (HOSPITAL_COMMUNITY)
Admission: RE | Admit: 2020-10-18 | Discharge: 2020-10-18 | Disposition: A | Discharge status: Home / Self Care | Payer: Medicare Other | Attending: Orthopedic Surgery | Admitting: Orthopedic Surgery

## 2020-10-18 ENCOUNTER — Encounter (HOSPITAL_COMMUNITY): Payer: Self-pay | Admitting: Orthopedic Surgery

## 2020-10-18 ENCOUNTER — Ambulatory Visit (HOSPITAL_COMMUNITY): Payer: Medicare Other

## 2020-10-18 DIAGNOSIS — Z91018 Allergy to other foods: Secondary | ICD-10-CM | POA: Diagnosis not present

## 2020-10-18 DIAGNOSIS — Z7901 Long term (current) use of anticoagulants: Secondary | ICD-10-CM | POA: Diagnosis not present

## 2020-10-18 DIAGNOSIS — S638X2A Sprain of other part of left wrist and hand, initial encounter: Secondary | ICD-10-CM | POA: Insufficient documentation

## 2020-10-18 DIAGNOSIS — Z7984 Long term (current) use of oral hypoglycemic drugs: Secondary | ICD-10-CM | POA: Diagnosis not present

## 2020-10-18 DIAGNOSIS — D1612 Benign neoplasm of short bones of left upper limb: Secondary | ICD-10-CM | POA: Insufficient documentation

## 2020-10-18 DIAGNOSIS — W19XXXA Unspecified fall, initial encounter: Secondary | ICD-10-CM | POA: Insufficient documentation

## 2020-10-18 DIAGNOSIS — Z886 Allergy status to analgesic agent status: Secondary | ICD-10-CM | POA: Insufficient documentation

## 2020-10-18 DIAGNOSIS — S63015A Dislocation of distal radioulnar joint of left wrist, initial encounter: Secondary | ICD-10-CM | POA: Insufficient documentation

## 2020-10-18 DIAGNOSIS — R7303 Prediabetes: Secondary | ICD-10-CM | POA: Insufficient documentation

## 2020-10-18 DIAGNOSIS — Z888 Allergy status to other drugs, medicaments and biological substances status: Secondary | ICD-10-CM | POA: Diagnosis not present

## 2020-10-18 DIAGNOSIS — S63592A Other specified sprain of left wrist, initial encounter: Secondary | ICD-10-CM | POA: Insufficient documentation

## 2020-10-18 DIAGNOSIS — Z79899 Other long term (current) drug therapy: Secondary | ICD-10-CM | POA: Insufficient documentation

## 2020-10-18 HISTORY — PX: WRIST ARTHROSCOPY WITH DEBRIDEMENT: SHX6194

## 2020-10-18 HISTORY — PX: CYST REMOVAL WITH BONE GRAFT: SHX6365

## 2020-10-18 LAB — GLUCOSE, CAPILLARY: Glucose-Capillary: 96 mg/dL (ref 70–99)

## 2020-10-18 SURGERY — WRIST ARTHROSCOPY WITH DEBRIDEMENT
Anesthesia: Monitor Anesthesia Care | Site: Wrist | Laterality: Left

## 2020-10-18 MED ORDER — ONDANSETRON HCL 4 MG/2ML IJ SOLN
INTRAMUSCULAR | Status: AC
  Filled 2020-10-18: qty 2

## 2020-10-18 MED ORDER — PROPOFOL 1000 MG/100ML IV EMUL
INTRAVENOUS | Status: AC
  Filled 2020-10-18: qty 100

## 2020-10-18 MED ORDER — ESMOLOL HCL 100 MG/10ML IV SOLN
INTRAVENOUS | Status: AC
  Filled 2020-10-18: qty 10

## 2020-10-18 MED ORDER — LACTATED RINGERS IV SOLN: INTRAVENOUS | Status: DC

## 2020-10-18 MED ORDER — CHLORHEXIDINE GLUCONATE 0.12 % MT SOLN: 15.0000 mL | Freq: Once | OROMUCOSAL | Status: AC

## 2020-10-18 MED ORDER — PROPOFOL 10 MG/ML IV BOLUS
INTRAVENOUS | Status: AC
  Filled 2020-10-18: qty 20

## 2020-10-18 MED ORDER — PROPOFOL 500 MG/50ML IV EMUL
INTRAVENOUS | Status: DC | PRN
  Administered 2020-10-18: 50 ug/kg/min via INTRAVENOUS

## 2020-10-18 MED ORDER — PHENYLEPHRINE 40 MCG/ML (10ML) SYRINGE FOR IV PUSH (FOR BLOOD PRESSURE SUPPORT)
PREFILLED_SYRINGE | INTRAVENOUS | Status: AC
  Filled 2020-10-18: qty 10

## 2020-10-18 MED ORDER — DEXAMETHASONE SODIUM PHOSPHATE 10 MG/ML IJ SOLN
INTRAMUSCULAR | Status: AC
  Filled 2020-10-18: qty 1

## 2020-10-18 MED ORDER — EPHEDRINE 5 MG/ML INJ
INTRAVENOUS | Status: AC
  Filled 2020-10-18: qty 10

## 2020-10-18 MED ORDER — MIDAZOLAM HCL 2 MG/2ML IJ SOLN
INTRAMUSCULAR | Status: AC
  Filled 2020-10-18: qty 2

## 2020-10-18 MED ORDER — FENTANYL CITRATE (PF) 100 MCG/2ML IJ SOLN
25.0000 ug | INTRAMUSCULAR | Status: DC | PRN
Start: 2020-10-18 — End: 2020-10-19

## 2020-10-18 MED ORDER — PHENYLEPHRINE 40 MCG/ML (10ML) SYRINGE FOR IV PUSH (FOR BLOOD PRESSURE SUPPORT)
PREFILLED_SYRINGE | INTRAVENOUS | Status: DC | PRN
  Administered 2020-10-18 (×5): 80 ug via INTRAVENOUS

## 2020-10-18 MED ORDER — ACETAMINOPHEN 500 MG PO TABS
ORAL_TABLET | ORAL | Status: AC
  Administered 2020-10-18: 1000 mg via ORAL
  Filled 2020-10-18: qty 2

## 2020-10-18 MED ORDER — BUPIVACAINE-EPINEPHRINE (PF) 0.5% -1:200000 IJ SOLN
INTRAMUSCULAR | Status: DC | PRN
  Administered 2020-10-18: 30 mL via PERINEURAL

## 2020-10-18 MED ORDER — OXYCODONE-ACETAMINOPHEN 7.5-325 MG PO TABS: 1.0000 | ORAL_TABLET | ORAL | 0 refills | Status: DC | PRN

## 2020-10-18 MED ORDER — DEXTROSE 5 % IV SOLN
INTRAVENOUS | Status: DC | PRN
  Administered 2020-10-18: 3 g via INTRAVENOUS

## 2020-10-18 MED ORDER — FENTANYL CITRATE (PF) 100 MCG/2ML IJ SOLN
INTRAMUSCULAR | Status: AC
  Administered 2020-10-18: 50 ug via INTRAVENOUS
  Filled 2020-10-18: qty 2

## 2020-10-18 MED ORDER — FENTANYL CITRATE (PF) 100 MCG/2ML IJ SOLN: 50.0000 ug | Freq: Once | INTRAMUSCULAR | Status: AC

## 2020-10-18 MED ORDER — ORAL CARE MOUTH RINSE: 15.0000 mL | Freq: Once | OROMUCOSAL | Status: AC

## 2020-10-18 MED ORDER — CLONIDINE HCL (ANALGESIA) 100 MCG/ML EP SOLN
EPIDURAL | Status: DC | PRN
  Administered 2020-10-18: 100 ug

## 2020-10-18 MED ORDER — PROMETHAZINE HCL 25 MG/ML IJ SOLN
6.2500 mg | INTRAMUSCULAR | Status: DC | PRN
Start: 2020-10-18 — End: 2020-10-19

## 2020-10-18 MED ORDER — OXYCODONE HCL 5 MG PO TABS: 5.0000 mg | ORAL_TABLET | Freq: Once | ORAL | Status: DC | PRN

## 2020-10-18 MED ORDER — ROCURONIUM BROMIDE 10 MG/ML (PF) SYRINGE
PREFILLED_SYRINGE | INTRAVENOUS | Status: AC
  Filled 2020-10-18: qty 10

## 2020-10-18 MED ORDER — ACETAMINOPHEN 500 MG PO TABS: 1000.0000 mg | ORAL_TABLET | Freq: Once | ORAL | Status: AC

## 2020-10-18 MED ORDER — MIDAZOLAM HCL 2 MG/2ML IJ SOLN: 1.0000 mg | Freq: Once | INTRAMUSCULAR | Status: AC

## 2020-10-18 MED ORDER — OXYCODONE HCL 5 MG/5ML PO SOLN: 5.0000 mg | Freq: Once | ORAL | Status: DC | PRN

## 2020-10-18 MED ORDER — CHLORHEXIDINE GLUCONATE 0.12 % MT SOLN
OROMUCOSAL | Status: AC
  Administered 2020-10-18: 15 mL via OROMUCOSAL
  Filled 2020-10-18: qty 15

## 2020-10-18 MED ORDER — FENTANYL CITRATE (PF) 250 MCG/5ML IJ SOLN
INTRAMUSCULAR | Status: AC
  Filled 2020-10-18: qty 5

## 2020-10-18 MED ORDER — 0.9 % SODIUM CHLORIDE (POUR BTL) OPTIME
TOPICAL | Status: DC | PRN
  Administered 2020-10-18: 1000 mL

## 2020-10-18 MED ORDER — LIDOCAINE 2% (20 MG/ML) 5 ML SYRINGE
INTRAMUSCULAR | Status: AC
  Filled 2020-10-18: qty 5

## 2020-10-18 MED ORDER — CEFAZOLIN SODIUM 1 G IJ SOLR
INTRAMUSCULAR | Status: AC
  Filled 2020-10-18: qty 20

## 2020-10-18 MED ORDER — MIDAZOLAM HCL 2 MG/2ML IJ SOLN
INTRAMUSCULAR | Status: AC
  Administered 2020-10-18: 1 mg via INTRAVENOUS
  Filled 2020-10-18: qty 2

## 2020-10-18 MED ORDER — SUCCINYLCHOLINE CHLORIDE 200 MG/10ML IV SOSY
PREFILLED_SYRINGE | INTRAVENOUS | Status: AC
  Filled 2020-10-18: qty 10

## 2020-10-18 SURGICAL SUPPLY — 66 items
BENZOIN TINCTURE PRP APPL 2/3 (GAUZE/BANDAGES/DRESSINGS) IMPLANT
BLADE CUDA 2.0 (BLADE) IMPLANT
BLADE EAR TYMPAN 2.5 60D BEAV (BLADE) ×3 IMPLANT
BNDG COHESIVE 1X5 TAN STRL LF (GAUZE/BANDAGES/DRESSINGS) ×3 IMPLANT
BNDG COHESIVE 3X5 TAN STRL LF (GAUZE/BANDAGES/DRESSINGS) IMPLANT
BNDG ELASTIC 3X5.8 VLCR STR LF (GAUZE/BANDAGES/DRESSINGS) ×3 IMPLANT
BNDG ESMARK 4X9 LF (GAUZE/BANDAGES/DRESSINGS) IMPLANT
BNDG GAUZE ELAST 4 BULKY (GAUZE/BANDAGES/DRESSINGS) ×3 IMPLANT
BONE CANC CHIPS 20CC PCAN1/4 (Bone Implant) ×3 IMPLANT
BUR CUDA 2.9 (BURR) IMPLANT
BUR FULL RADIUS 2.9 (BURR) IMPLANT
BUR GATOR 2.9 (BURR) IMPLANT
BUR SPHERICAL 2.9 (BURR) IMPLANT
CANISTER SUCT 3000ML PPV (MISCELLANEOUS) IMPLANT
CANISTER SUCT LVC 12 LTR MEDI- (MISCELLANEOUS) IMPLANT
CHIPS CANC BONE 20CC PCAN1/4 (Bone Implant) ×2 IMPLANT
CORD BIPOLAR FORCEPS 12FT (ELECTRODE) IMPLANT
COVER WAND RF STERILE (DRAPES) IMPLANT
CUFF TOURN SGL QUICK 18X4 (TOURNIQUET CUFF) IMPLANT
CUFF TOURN SGL QUICK 24 (TOURNIQUET CUFF) ×1
CUFF TRNQT CYL 24X4X16.5-23 (TOURNIQUET CUFF) ×2 IMPLANT
DRAPE OEC MINIVIEW 54X84 (DRAPES) ×3 IMPLANT
DRAPE SURG 17X23 STRL (DRAPES) ×3 IMPLANT
DRSG KUZMA FLUFF (GAUZE/BANDAGES/DRESSINGS) ×3 IMPLANT
DRSG PAD ABDOMINAL 8X10 ST (GAUZE/BANDAGES/DRESSINGS) ×6 IMPLANT
DRSG XEROFORM 1X8 (GAUZE/BANDAGES/DRESSINGS) ×3 IMPLANT
GAUZE SPONGE 4X4 12PLY STRL (GAUZE/BANDAGES/DRESSINGS) ×3 IMPLANT
GAUZE XEROFORM 1X8 LF (GAUZE/BANDAGES/DRESSINGS) IMPLANT
GLOVE BIO SURGEON STRL SZ 6.5 (GLOVE) ×3 IMPLANT
GLOVE SURG ORTHO 8.0 STRL STRW (GLOVE) ×3 IMPLANT
GOWN STRL REUS W/ TWL LRG LVL3 (GOWN DISPOSABLE) ×4 IMPLANT
GOWN STRL REUS W/ TWL XL LVL3 (GOWN DISPOSABLE) ×2 IMPLANT
GOWN STRL REUS W/TWL LRG LVL3 (GOWN DISPOSABLE) ×2
GOWN STRL REUS W/TWL XL LVL3 (GOWN DISPOSABLE) ×1
KIT BASIN OR (CUSTOM PROCEDURE TRAY) ×3 IMPLANT
KIT TURNOVER KIT B (KITS) ×3 IMPLANT
NEEDLE 18GX1X1/2 (RX/OR ONLY) (NEEDLE) ×3 IMPLANT
NEEDLE 22X1 1/2 (OR ONLY) (NEEDLE) ×3 IMPLANT
NS IRRIG 1000ML POUR BTL (IV SOLUTION) IMPLANT
PACK ORTHO EXTREMITY (CUSTOM PROCEDURE TRAY) ×3 IMPLANT
PAD ARMBOARD 7.5X6 YLW CONV (MISCELLANEOUS) ×6 IMPLANT
PAD CAST 3X4 CTTN HI CHSV (CAST SUPPLIES) IMPLANT
PADDING CAST ABS 3INX4YD NS (CAST SUPPLIES)
PADDING CAST ABS 4INX4YD NS (CAST SUPPLIES)
PADDING CAST ABS COTTON 3X4 (CAST SUPPLIES) IMPLANT
PADDING CAST ABS COTTON 4X4 ST (CAST SUPPLIES) IMPLANT
PADDING CAST COTTON 3X4 STRL (CAST SUPPLIES)
SET SM JOINT TUBING/CANN (CANNULA) IMPLANT
SHAVER SABRE 2.0 (BURR) ×3 IMPLANT
SPLINT FINGER 5.25 W/BULB ALUM (SOFTGOODS) ×3 IMPLANT
SPLINT PLASTER CAST XFAST 3X15 (CAST SUPPLIES) IMPLANT
SPLINT PLASTER XTRA FASTSET 3X (CAST SUPPLIES)
STOCKINETTE 4X48 STRL (DRAPES) ×3 IMPLANT
STRIP CLOSURE SKIN 1/2X4 (GAUZE/BANDAGES/DRESSINGS) IMPLANT
SUT ETHILON 4 0 P 3 18 (SUTURE) ×3 IMPLANT
SUT MERSILENE 4 0 P 3 (SUTURE) IMPLANT
SUT VIC AB 5-0 P-3 18XBRD (SUTURE) IMPLANT
SUT VIC AB 5-0 P3 18 (SUTURE)
SUT VICRYL RAPIDE 4/0 PS 2 (SUTURE) IMPLANT
SYR 20ML LL LF (SYRINGE) ×3 IMPLANT
SYR CONTROL 10ML LL (SYRINGE) ×3 IMPLANT
TUBE CONNECTING 12X1/4 (SUCTIONS) ×3 IMPLANT
TUBING ARTHROSCOPY IRRIG 16FT (MISCELLANEOUS) ×3 IMPLANT
UNDERPAD 30X36 HEAVY ABSORB (UNDERPADS AND DIAPERS) ×3 IMPLANT
WAND 1.5 MICROBLATOR (SURGICAL WAND) IMPLANT
WATER STERILE IRR 1000ML POUR (IV SOLUTION) IMPLANT

## 2020-10-18 NOTE — Anesthesia Procedure Notes (Signed)
Procedure Name: MAC Date/Time: 10/18/2020 3:32 PM Performed by: Darletta Moll, CRNA Pre-anesthesia Checklist: Patient identified, Emergency Drugs available, Suction available and Patient being monitored Patient Re-evaluated:Patient Re-evaluated prior to induction Oxygen Delivery Method: Simple face mask

## 2020-10-18 NOTE — Op Note (Signed)
I assisted Surgeon(s) and Role:    * Daryll Brod, MD - Primary    Leanora Cover, MD - Assisting on the Procedure(s): LEFT WRIST ARTHROSCOPY WITH DEBRIDEMENT BONE GRAFTING OF ENCHONDROMA MIDDLE Enigma OF LEFT MIDDLE FINGER on 10/18/2020.  I provided assistance on this case as follows: management of arthroscopy equipment, curettage of cyst, placement bone graft.  Electronically signed by: Leanora Cover, MD Date: 10/18/2020 Time: 5:09 PM

## 2020-10-18 NOTE — Brief Op Note (Signed)
10/18/2020  5:09 PM  PATIENT:  Meghan [name]  55 y.o. 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  POST-OPERATIVE DIAGNOSIS:  LEFT WRIST SCAPHO LUNATE TEAR TRIANGULAR   PROCEDURE:  Procedure(s) with comments: LEFT WRIST ARTHROSCOPY WITH DEBRIDEMENT (Left) - AXILLARY BLOCK BONE GRAFTING OF ENCHONDROMA MIDDLE PHANLANX OF LEFT MIDDLE FINGER (Left) - AXILLARY BLOCK  SURGEON:  Surgeon(s) and Role:    * Daryll Brod, MD - Primary    * Leanora Cover, MD - Assisting  PHYSICIAN ASSISTANT:   ASSISTANTS: K Nalea Salce,MD   ANESTHESIA:   regional and IV sedation  EBL: 15ml  BLOOD ADMINISTERED:none  DRAINS: none   LOCAL MEDICATIONS USED:  NONE  SPECIMEN:  Excision  DISPOSITION OF SPECIMEN:  PATHOLOGY  COUNTS:  YES  TOURNIQUET:   Total Tourniquet Time Documented: Upper Arm (Left) - 43 minutes Total: Upper Arm (Left) - 43 minutes   DICTATION: .Viviann Spare Dictation  PLAN OF CARE: Discharge to home after PACU  PATIENT DISPOSITION:  PACU - hemodynamically stable.

## 2020-10-18 NOTE — Anesthesia Procedure Notes (Signed)
Anesthesia Regional Block: Supraclavicular block   Pre-Anesthetic Checklist: ,, timeout performed, Correct Patient, Correct Site, Correct Laterality, Correct Procedure, Correct Position, site marked, Risks and benefits discussed, pre-op evaluation,  At surgeon's request and post-op pain management  Laterality: Left  Prep: Maximum Sterile Barrier Precautions used, chloraprep       Needles:  Injection technique: Single-shot  Needle Type: Echogenic Stimulator Needle     Needle Length: 9cm  Needle Gauge: 22     Additional Needles:   Procedures:,,,, ultrasound used (permanent image in chart),,,,  Narrative:  Start time: 10/18/2020 2:27 PM End time: 10/18/2020 2:30 PM Injection made incrementally with aspirations every 5 mL.  Performed by: Personally  Anesthesiologist: Brennan Bailey, MD  Additional Notes: Risks, benefits, and alternative discussed. Patient gave consent for procedure. Patient prepped and draped in sterile fashion. Sedation administered, patient remains easily responsive to voice. Relevant anatomy identified with ultrasound guidance. Local anesthetic given in 5cc increments with no signs or symptoms of intravascular injection. No pain or paraesthesias with injection. Patient monitored throughout procedure with signs of LAST or immediate complications. Tolerated well. Ultrasound image placed in chart.  Tawny Asal, MD

## 2020-10-18 NOTE — Transfer of Care (Signed)
Immediate Anesthesia Transfer of Care Note  Patient: Meghan [name]  Procedure(s) Performed: LEFT WRIST ARTHROSCOPY WITH DEBRIDEMENT (Left Wrist) BONE GRAFTING OF ENCHONDROMA MIDDLE PHANLANX OF LEFT MIDDLE FINGER (Left Finger)  Patient Location: PACU  Anesthesia Type:MAC combined with regional for post-op pain  Level of Consciousness: awake, alert , oriented and patient cooperative  Airway & Oxygen Therapy: Patient Spontanous Breathing and Patient connected to face mask oxygen  Post-op Assessment: Report given to RN and Post -op Vital signs reviewed and stable  Post vital signs: Reviewed and stable  Last Vitals:  Vitals Value Taken Time  BP 115/77 10/18/20 1713  Temp    Pulse 65 10/18/20 1716  Resp 22 10/18/20 1716  SpO2 100 % 10/18/20 1716  Vitals shown include unvalidated device data.  Last Pain:  Vitals:   10/18/20 1259  TempSrc:   PainSc: 3          Complications: No complications documented.

## 2020-10-18 NOTE — Op Note (Signed)
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   ASSISTANT: Leanora Cover, MD   ANESTHESIA:  Regional with sedation   INTRAVENOUS FLUIDS:  Per anesthesia flow sheet.   ESTIMATED BLOOD LOSS:  Minimal.   COMPLICATIONS:  None.   SPECIMENS:   Bone tumor   TOURNIQUET TIME:    Total Tourniquet Time Documented: Upper Arm (Left) - 43 minutes Total: Upper Arm (Left) - 43 minutes    DISPOSITION:  Stable to PACU.   INDICATIONS: Patient is a 55 year old female with a history of expansion of her middle phalanx left middle finger pain in her wrist.  This not responded to conservative treatment MRI reveals a scapholunate ligament partial tear with arthritis at the right facet of her distal radius and injury to the radial side of the triangular fibrocartilage complex left wrist.  X-rays reveal a tumor expansile in nature to the middle phalanx of her left middle finger.  She is desirous of arthroscopy of her wrist with debridement as dictated by findings and excision of the tumor with bone graft.  She is aware that there is no guarantee to the surgery the possibility of infection recurrence injury to arteries nerves tendons incomplete relief symptoms dystrophy of further surgery may be necessary on her wrist depending on findings and results.  The preoperative area the patient is seen extremity marked by both patient and surgeon antibiotic given.  A supraclavicular block was  carried out without difficulty under the direction the anesthesia department. OPERATIVE COURSE: Patient is brought to the operating room placed in a supine position with left arm free.  She was prepped and draped with ChloraPrep a 3-minute dry time allowed and a timeout taken to confirm patient procedure.  The limb was placed in the arc arthroscopy tower 10 pounds of traction applied.  Portals were established after inflation of the joint through the 3-4 portal transverse incision made deepened with a hemostat blunt trocar used to enter the joint joint was inspected difficulty with visualized of the ulnar side of the wrist due to a scapholunate ligament tear.  The radial side showed normal volar ligaments.  There was no change on the proximal aspect of the scaphoid.  A irrigation catheter was placed in 6 you in a 4-5 portal opened after localization with a 22-gauge needle.  A transverse incision made deepened with a hemostat blunt trocar used to enter the joint the scope was introduced ulnarly.  This allowed visualization of a complete scapholunate ligament tear with rotation of the lunate and an avulsion of cartilage from almost the entire lunate facet with a tear of the attachment of the triangular fibrocartilage complex to the radius.  The lunotriquetral ligament did not show any damage.  The scope was reintroduced into the radial side the debridement was then performed of the synovial tissue and of the cartilage from the lunate facet of the distal radius which was floating free.  Triangular fibrocartilage complex was then debrided and had no radial  attachment due to the a avulsed portion of the ulnar side of the radius articular surface.  This was done with a angled Beaver blade and full-radius shaver and graspers.  This was done to a stable rim of the triangular fibrocartilage complex.  A partial debridement of the scapholunate ligament was then performed decided to proceed with a midcarpal inspection and the  midcarpal joint was opened through the ulnar midcarpal portal after localization with a 22-gauge needle transverse incision made deepened with a hemostat followed by blunt trocar and then introduction of the scope.  The instability of both scapholunate and lunotriquetral ligaments was noted.  There was no articular damage to the proximal aspect of the capitate or hamate there was rotation of the scaphoid.  The arthroscopy was terminated at this point time.  Treatments were removed the portals were closed with interrupted 4 nylon sutures The hand was removed from the arc arthroscopy tower.  A Esmarch bandage was then used to exsanguinate the limb and the tourniquet inflated to 275 mmHg. The tumor on the middle finger was then addressed a longitudinal incision was made directly over the dorsum what appeared to be cartilage or small pieces of bone.  This was removed with a house curette.  This was then irrigated clear removing other portions the specimen was sent to pathology.  A Ray-Tec sponge indicator was then stripped from a 4 x 4 and inserted into the tumor which filled it with multiple image intensification x-rays are revealing that the tumor was appeared to be fully removed.  Allograft chips were then crushed these were then packed into the middle phalanx x-rays performed revealed that the tumor appeared to be the middle phalanx carried down through subcutaneous tissue bleeders were electrocauterized with bipolar.  An incision was made between the 2 lateral bands.  It was taking care to protect the adverse fibers at the proximal aspect the periosteum was opened drill holes were then placed with a to a K wire these were completed removing the dorsal window.  The tumor was immediately encountered this was a very f flatness type tumor with flecks of either cartilage or bone.  The tumor was removed with house curettes.  The cavity was then irrigated removing loose fragments of the gelatinous material which were  also sent to pathology.  Actuation of the tumor was confirmed with image intensification by placing the indicator from a 4 x 4 packing it into the tumor cavity and x-rays confirmed that the tumor cavity appeared to be fully evacuated.  Bone chips were then crushed these were then placed into the tumor cavity.  X-rays AP lateral revealed that this appeared to be fully grafted.  The wound was again irrigated the skin was then closed with interrupted 4-0 nylon sutures.  A sterile compressive dressing with dorsal palmar support using ABD pads then applied to the wrist.  The tourniquet was deflated and all fingers immediately pinked after  a light dressing and splint was then applied to the middle finger.  Patient tolerated procedure well was taken to the recovery room for observation in satisfactory condition.   Daryll Brod, MD Electronically signed, 10/18/20

## 2020-10-18 NOTE — H&P (Signed)
Meghan [name] is an 55 y.o. female.   Chief Complaint: mass left middle finger and wrist pain HPI: Meghan [name] is a 55 year old female referred by Dr. Lorin Mercy for consultation regarding a mass in her middle finger phalanx enlarging the bone and wrist pain. This began after a fall on her right wrist in a flexed position she complains of pain in her wrists with the injury occurred in July 05, 2020. She was seen by Dr. Lorin Mercy after being placed in a splint in the emergency room. She has no prior history of injury complains of an achy pain with a VAS score 10/10 primarily in the radial side of her wrist. She was placed in a brace which she states did not help. She was placed on Norco. She is not able to take meloxicam. She has no prior history of injury. She is borderline diabetic with a history of arthritis no history of thyroid problems or gout. Family history is positive diabetes thyroid problems.  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 Posey Pronto.     Past Medical History:  Diagnosis Date  . Arthritis    left knee  . Asthma   . Atrial fibrillation ([place])   . Atrial fibrillation ([place]) 02/16/2019  . CHF (congestive heart failure) ([name]) 01/2019   Physician from [place] informed her she had this   . Depression   . Dysrhythmia    Atrial fibrillation  . GERD (gastroesophageal reflux disease)   . Hiatal hernia   . History of pulmonary embolus (PE)   . Hypercholesterolemia   . Macular degeneration    of left eye  . Osteoarthritis    knees  . Pneumonia    in the past  . Pre-diabetes   . Pulmonary embolism ([place])    bilat  . Seizure ([place])   . Seizure disorder ([place]) 2017  . Sleep apnea CPAP   [facility]  . Stroke [facility])    1989 and 1995 (left sided weakness)  . Vitamin D  deficiency     Past Surgical History:  Procedure Laterality Date  . ABDOMINAL HYSTERECTOMY  2003   complete  . CARDIAC CATHETERIZATION  2017   in [facility] in [place]  . CESAREAN SECTION  [id number]   x 2  . CESAREAN SECTION     x 2  . CHOLECYSTECTOMY  2003  . COLONOSCOPY WITH PROPOFOL N/A 06/05/2019   Procedure: COLONOSCOPY WITH PROPOFOL;  Surgeon: Carol Ada, MD;  Location: [facility] ENDOSCOPY;  Service: Endoscopy;  Laterality: N/A;  . DIAGNOSTIC LAPAROSCOPY  2015; 2017   lap hernia repair x2  . HERNIA REPAIR  [id number], [id number]   umbilical hernia repair  . IVC FILTER INSERTION  2003   Hx pulmonary embolus  . POLYPECTOMY  06/05/2019   Procedure: POLYPECTOMY;  Surgeon: Carol Ada, MD;  Location: [facility] ENDOSCOPY;  Service: Endoscopy;;  . SHOULDER ARTHROSCOPY W/ ROTATOR CUFF REPAIR  08/28/2017   right shoulder    Family History  Problem Relation Age of Onset  . Breast cancer Mother   . Breast cancer Maternal Grandmother   . Heart attack Father   . Diabetes Father   . Congestive Heart Failure Father    Social History:  reports that she has never smoked. She has never used smokeless  tobacco. She reports that she does not drink alcohol and does not use drugs.  Allergies:  Allergies  Allergen Reactions  . Acetaminophen-Codeine Swelling, Rash and Other (See Comments)    Tylenol with Codeine, Tylenol #3,  (facial swelling, hives)  . Other Hives and Other (See Comments)    Muscle relaxer that starts with a "T" caused hives (not tizanidine) January or February 2020- made the patient VERY sick  . Coconut Oil Rash and Other (See Comments)    ANY coconut products   . Meloxicam Rash  . Tomato Rash    Medications Prior to Admission  Medication Sig Dispense Refill  . acetaminophen (TYLENOL) 500 MG tablet Take 1,000 mg by mouth every 6 (six) hours as needed for moderate pain.    Marland Kitchen albuterol (VENTOLIN HFA) 108 (90 Base) MCG/ACT inhaler Inhale 2 puffs into the lungs every 6 (six)  hours as needed for wheezing or shortness of breath.    Marland Kitchen BREZTRI AEROSPHERE 160-9-4.8 MCG/ACT AERO Inhale 2 puffs into the lungs in the morning and at bedtime.     . Cholecalciferol (VITAMIN D-3) 25 MCG (1000 UT) CAPS Take 1,000 Units by mouth daily.    . cycloSPORINE (RESTASIS) 0.05 % ophthalmic emulsion Place 1 drop into both eyes 2 (two) times daily.     . flecainide (TAMBOCOR) 50 MG tablet Take 50 mg by mouth 2 (two) times daily.     . furosemide (LASIX) 40 MG tablet Take 40 mg by mouth daily with breakfast.     . metFORMIN (GLUCOPHAGE) 500 MG tablet Take 500 mg by mouth daily with breakfast.    . Multiple Vitamins-Minerals (PRESERVISION AREDS 2 PO) Take 2 capsules by mouth daily with breakfast.     . Omega-3 Fatty Acids (FISH OIL) 1000 MG CAPS Take 1,000 mg by mouth daily with breakfast.     . omeprazole (PRILOSEC) 40 MG capsule Take 40 mg by mouth daily.    Marland Kitchen OZEMPIC, 0.25 OR 0.5 MG/DOSE, 2 MG/1.5ML SOPN Inject 0.5 mg into the skin every Sunday.    . promethazine (PHENERGAN) 12.5 MG tablet Take 12.5 mg by mouth at bedtime.    . rivaroxaban (XARELTO) 20 MG TABS tablet Take 20 mg by mouth at bedtime.     Marland Kitchen tiZANidine (ZANAFLEX) 4 MG tablet Take 4 mg by mouth at bedtime as needed for muscle spasms.       Results for orders placed or performed during the hospital encounter of 10/18/20 (from the past 48 hour(s))  Glucose, capillary     Status: None   Collection Time: 10/18/20 12:05 PM  Result Value Ref Range   Glucose-Capillary 96 70 - 99 mg/dL    Comment: Glucose reference range applies only to samples taken after fasting for at least 8 hours.    No results found.   Pertinent items are noted in HPI.  Blood pressure (!) 161/84, pulse 72, temperature 97.8 F (36.6 C), temperature source Temporal, resp. rate 18, height 5\' 5"  (1.651 m), weight (!) 142.4 kg, SpO2 96 %.  General appearance: alert, cooperative and appears stated age Head: Normocephalic, without obvious abnormality Neck:  no JVD Resp: clear to auscultation bilaterally Cardio: regular rate and rhythm, S1, S2 normal, no murmur, click, rub or gallop GI: soft, non-tender; bowel sounds normal; no masses,  no organomegaly Extremities: pain left wrist and mass middle finger Pulses: 2+ and symmetric Skin: Skin color, texture, turgor normal. No rashes or lesions Neurologic: Grossly normal Incision/Wound: na  Assessment/Plan Assessment:  1. Enchondroma  2. Left wrist pain  3. Injury of left scapholunate ligament with no instability  Plan: We have discussed risk and complications with her. She is aware that there is no guarantee to the surgery the possibility of infection recurrence injury to arteries nerves tendons incomplete relief symptoms dystrophy. She would like to proceed and is scheduled for arthroscopy debridement possible shrinkage scapholunate ligament and excision of enchondroma with bone grafting left middle finger middle phalanx and outpatient under regional anesthesia.     Daryll Brod 10/18/2020, 12:11 PM

## 2020-10-18 NOTE — Progress Notes (Signed)
Orthopedic Tech Progress Note Patient Details:  Meghan [name] August 15, 1966 [id number]  Ortho Devices Type of Ortho Device: Arm sling Ortho Device/Splint Location: LUE Ortho Device/Splint Interventions: Ordered,Application,Adjustment   Post Interventions Patient Tolerated: Well Instructions Provided: Adjustment of device,Care of device,Poper ambulation with device   [name] 10/18/2020, 6:53 PM

## 2020-10-18 NOTE — Discharge Instructions (Addendum)

## 2020-10-18 NOTE — Anesthesia Postprocedure Evaluation (Signed)
Anesthesia Post Note  Patient: Meghan [name]  Procedure(s) Performed: LEFT WRIST ARTHROSCOPY WITH DEBRIDEMENT (Left Wrist) BONE GRAFTING OF ENCHONDROMA MIDDLE PHANLANX OF LEFT MIDDLE FINGER (Left Finger)     Patient location during evaluation: PACU Anesthesia Type: Regional Level of consciousness: awake and alert Pain management: pain level controlled Vital Signs Assessment: post-procedure vital signs reviewed and stable Respiratory status: spontaneous breathing and respiratory function stable Cardiovascular status: stable Postop Assessment: no apparent nausea or vomiting Anesthetic complications: no   No complications documented.  Last Vitals:  Vitals:   10/18/20 1730 10/18/20 1745  BP: 107/77 106/73  Pulse: 62 68  Resp: (!) 22 19  Temp:    SpO2: 100% 100%    Last Pain:  Vitals:   10/18/20 1745  TempSrc:   PainSc: 0-No pain                 Aviv Lengacher DANIEL

## 2020-10-19 ENCOUNTER — Encounter (HOSPITAL_COMMUNITY): Payer: Self-pay | Admitting: Orthopedic Surgery

## 2020-10-21 LAB — SURGICAL PATHOLOGY

## 2020-10-24 ENCOUNTER — Telehealth (HOSPITAL_COMMUNITY): Payer: Self-pay | Admitting: Psychiatry

## 2020-10-24 ENCOUNTER — Ambulatory Visit (INDEPENDENT_AMBULATORY_CARE_PROVIDER_SITE_OTHER): Payer: Medicare Other | Admitting: Licensed Clinical Social Worker

## 2020-10-24 ENCOUNTER — Other Ambulatory Visit: Payer: Self-pay

## 2020-10-24 DIAGNOSIS — F4310 Post-traumatic stress disorder, unspecified: Secondary | ICD-10-CM

## 2020-10-24 DIAGNOSIS — F411 Generalized anxiety disorder: Secondary | ICD-10-CM

## 2020-10-24 DIAGNOSIS — F331 Major depressive disorder, recurrent, moderate: Secondary | ICD-10-CM

## 2020-10-24 NOTE — Telephone Encounter (Signed)
D:  [name], LCSW referred pt to virtual MH-IOP.  A:  Placed call to orient pt and answer her questions.  Pt will start [place] on 10-27-20.  Encouraged pt to verify her benefits.  R:  Pt receptive.

## 2020-10-24 NOTE — Progress Notes (Signed)
Virtual Visit via Video Note   I connected with [name] on 10/24/20 at 2:00pm by video enabled telemedicine application and verified that I am speaking with the correct person using two identifiers.   I discussed the limitations, risks, security and privacy concerns of performing an evaluation and management service by video and the availability of in person appointments. I also discussed with the patient that there may be a patient responsible charge related to this service. The patient expressed understanding and agreed to proceed.   I discussed the assessment and treatment plan with the patient. The patient was provided an opportunity to ask questions and all were answered. The patient agreed with the plan and demonstrated an understanding of the instructions.   The patient was advised to call back or seek an in-person evaluation if the symptoms worsen or if the condition fails to improve as anticipated.   I provided 1 hour of non-face-to-face time during this encounter.     [name], LCSW, LCAS _______________________ THERAPIST PROGRESS NOTE   Session Time: 2:00pm - 3:00pm  Location: Patient: Patient Home Provider: Clinical Home Office    Participation Level: Active    Behavioral Response: Alert, casually dressed, depressed mood/affect   Type of Therapy:  Individual Therapy   Treatment Goals addressed: Anxiety/depression management; medication management; Maintaining healthy boundaries   Interventions: CBT, healthy boundaries, MHIOP referral    Summary: Marithza Malachi is a 55 year old divorced African American female that presented for virtual appointment today with diagnoses of Major Depressive Disorder, recurrent, moderate, Generalized Anxiety Disorder and PTSD.      Suicidal/Homicidal: None; without plan or intent    Therapist Response:  Clinician met with Armenta for virtual therapy appointment and assessed for safety, sobriety, and medication compliance.  Dorothea presented  for today's session on time and was alert, oriented x5, with no evidence or self-report of active SI/HI or A/V H.  Sparrow reported that she continues taking medications responsibly and denied any use of alcohol or illicit substances.  Clinician inquired about Emojean's current emotional ratings, as well as any significant changes in thoughts, feelings, or behavior since previous check-in.  Medha reported scores of 10/10 for depression, anxiety, and anger, noting that although she has not experienced any panic attacks, she did have a seizure on Saturday after having a heated argument on the phone with one of her daughters.  Matrice reported that ongoing contact with her daughters continues to have a negative impact upon her mental health, and this has led her to return to pattern of isolation, cutting contact with all members of network, including positive ones.  Jowanda stated "I know its wrong to push people away, but its how I've felt.  I don't want to put that hurt on anyone else or feel like they pity me".  Clinician assisted Jobeth in weighing pros and cons of relying on isolation behavior based upon how this has influenced her mood in the past, and reminded her of treatment goal focused upon managing boundaries more appropriately.  Kristalyn acknowledged that isolation has never helped, and only made the situation worse, but she feels she has no other outlet.  Clinician inquired about whether Adoria would be willing to step up to higher level of care temporarily based upon recent severity of her mental health symptoms, and discussed benefits of engaging in weekly MHIOP virtual groups available through our clinic.  Leisel reported that she would be willing to speak with case manager about this, stating "I know  I need help and I don't like feeling this way every day".  Interventions were effective, as evidenced by Sabeen reporting that this session helped her gain insight into triggers influencing her recent mood shift, as  well as identifying resources which could assist her with stabilization and support.  Linzy stated "I'm glad I didn't cancel today, because I know I would have just felt worse".  Clinician agreed to outreach MHIOP case manager about Risha's interest in referral and will continue to monitor closely.                                          Plan: Follow up again in 1 week virtually.    Diagnosis: Major depressive disorder, recurrent, moderate; Generalized Anxiety Disorder; PTSD    [name], LCSW, LCAS 10/24/20

## 2020-10-27 ENCOUNTER — Other Ambulatory Visit (HOSPITAL_COMMUNITY): Payer: Medicare Other | Attending: Psychiatry | Admitting: Psychiatry

## 2020-10-27 ENCOUNTER — Other Ambulatory Visit: Payer: Self-pay

## 2020-10-27 ENCOUNTER — Encounter (HOSPITAL_COMMUNITY): Payer: Self-pay | Admitting: Psychiatry

## 2020-10-27 DIAGNOSIS — F329 Major depressive disorder, single episode, unspecified: Secondary | ICD-10-CM | POA: Insufficient documentation

## 2020-10-27 DIAGNOSIS — F411 Generalized anxiety disorder: Secondary | ICD-10-CM | POA: Insufficient documentation

## 2020-10-27 DIAGNOSIS — F331 Major depressive disorder, recurrent, moderate: Secondary | ICD-10-CM

## 2020-10-27 NOTE — Progress Notes (Signed)
Virtual Visit via Video Note  I connected with Meghan [name] on @TODAY @ at  9:00 AM EST by a video enabled telemedicine application and verified that I am speaking with the correct person using two identifiers.  Location: Patient: at home Provider: at office   I discussed the limitations of evaluation and management by telemedicine and the availability of in person appointments. The patient expressed understanding and agreed to proceed.  I discussed the assessment and treatment plan with the patient. The patient was provided an opportunity to ask questions and all were answered. The patient agreed with the plan and demonstrated an understanding of the instructions.   The patient was advised to call back or seek an in-person evaluation if the symptoms worsen or if the condition fails to improve as anticipated.  I provided 30 minutes of non-face-to-face time during this encounter.   Meghan [name], M.Ed,CNA   Patient ID: Meghan [name], female   DOB: [birth year], 55 y.o.   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  Meghan [name] reported that she had begun feeling increasingly depressed and anxious during this time, and sought therapy for support.  Meghan [name] reported that weekly therapy has proved to be beneficial, as it gives her an opportunity to open up about her struggles without judgment, work on maintaining boundaries with family, improve communication skills, and increase available support.  Meghan [name] reported that she would like to continue with therapy to help manage symptoms of depression, anxiety and trauma, stating "I still have my days where I'm depressed and get anxious about a bunch of different stuff".  [name] therapist referred pt to virtual MH-IOP d/t worsening  depressive and anxiety sx's.  Denies SI/HI or A/V hallucinations.  Stressors include:  1) Medical Issues:  Pt has had recent procedures (ie. 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  2) Continued conflictual relationships with daughters.  Pt apparently has poor boundaries with her twin daughters.  "I practically raised my oldest grandson (age 75).  He has ADHD and wants to come down here and my daughter won't let him."  Pt became very tearful, stating that she used to talk to him everyday and her daughter won't let her now because she's jealous of their close relationship.   CC: previous CCA for more information/hx. A:  Oriented pt to virtual MH-IOP.  Pt gave verbal consent for treatment, to release chart information to referred providers and to complete any forms if needed.  Pt also gave consent for attending group virtually d/t COVID-19 social distancing restrictions.  Encouraged support groups thru [facility].  F/U with [name], LCSW.  Will refer to pt a psychiatrist if she doesn't already have one.  R:  Pt receptive.   Meghan [name], M.Ed,CNA

## 2020-10-28 ENCOUNTER — Other Ambulatory Visit: Payer: Self-pay

## 2020-10-28 ENCOUNTER — Other Ambulatory Visit (HOSPITAL_COMMUNITY): Payer: Medicare Other | Admitting: Family

## 2020-10-28 DIAGNOSIS — F331 Major depressive disorder, recurrent, moderate: Secondary | ICD-10-CM

## 2020-10-28 DIAGNOSIS — F411 Generalized anxiety disorder: Secondary | ICD-10-CM

## 2020-10-28 DIAGNOSIS — F4310 Post-traumatic stress disorder, unspecified: Secondary | ICD-10-CM

## 2020-10-28 NOTE — Progress Notes (Signed)
Virtual Visit via Video Note  I connected with Meghan [name] on 10/28/20 at  9:00 AM EST by a video enabled telemedicine application and verified that I am speaking with the correct person using two identifiers.  Location: Patient: Home Provider: Home   I discussed the limitations of evaluation and management by telemedicine and the availability of in person appointments. The patient expressed understanding and agreed to proceed.   I discussed the assessment and treatment plan with the patient. The patient was provided an opportunity to ask questions and all were answered. The patient agreed with the plan and demonstrated an understanding of the instructions.   The patient was advised to call back or seek an in-person evaluation if the symptoms worsen or if the condition fails to improve as anticipated.  I provided 15 minutes of non-face-to-face time during this encounter.   Derrill Center, NP    Psychiatric Initial Adult Assessment   Patient Identification: Meghan [name] MRN:  [id number] Date of Evaluation:  10/28/2020 Referral Source: [name]  Chief Complaint:   Visit Diagnosis: No diagnosis found.  History of Present Illness:  Meghan [name] 55 year old African-American female was referred by her therapist Chryl Heck due to worsening depression and anxiety.  She reports ongoing stressors related to family, medical and financial strain.  States she moved from [place] due to declining health and to reestablish her life.  States she was residing with her daughter however her daughter became upset when she decided to move to [place].  States " my daughter is punishing me and not allowing me to see my grandson."  Reports a fairly close relationship with her grandson however have not been in contact with her daughter since leaving [place].  Reports her daughter has been residing in local homeless shelters due to relationship between her current boyfriend.  Patient reports a strained  relationship between she and the other twin daughter.  Patient does endorse passive suicidal ideations. "  Life is not worth living if I do not have a family."  Patient is very tearful and depressed throughout this assessment.  States "I have nothing to live for."  Denied suicidal ideation plan or intent.   Stanton Kidney reports recently had surgery to her hand.  Does report a family history with mental illness.  Denies previous inpatient admissions.  Denies alcohol or substance abuse use.  We will follow-up with patient regarding initiating antidepressant for mood stabilization.Patient to start intensive outpatient programming on 10/28/2018.  Associated Signs/Symptoms: Depression Symptoms:  depressed mood, feelings of worthlessness/guilt, difficulty concentrating, (Hypo) Manic Symptoms:  Distractibility, Anxiety Symptoms:  Excessive Worry, Psychotic Symptoms:  Hallucinations: None PTSD Symptoms: NA  Past Psychiatric History:   Previous Psychotropic Medications: No   Substance Abuse History in the last 12 months:  No.  Consequences of Substance Abuse: NA  Past Medical History:  Past Medical History:  Diagnosis Date  . Arthritis    left knee  . Asthma   . Atrial fibrillation ([place])   . Atrial fibrillation ([place]) 02/16/2019  . CHF (congestive heart failure) ([place]) 01/2019   Physician from [place] informed her she had this   . Depression   . Dysrhythmia    Atrial fibrillation  . GERD (gastroesophageal reflux disease)   . Hiatal hernia   . History of pulmonary embolus (PE)   . Hypercholesterolemia   . Macular degeneration    of left eye  . Osteoarthritis    knees  . Pneumonia    in  the past  . Pre-diabetes   . Pulmonary embolism ([place])    bilat  . Seizure ([place])   . Seizure disorder ([place]) 2017  . Sleep apnea CPAP   [facility]  . Stroke [facility])    1989 and 1995 (left sided weakness)  . Vitamin D deficiency     Past Surgical History:  Procedure Laterality Date  . ABDOMINAL HYSTERECTOMY   2003   complete  . CARDIAC CATHETERIZATION  2017   in [facility] in [place]  . CESAREAN SECTION  [id number]   x 2  . CESAREAN SECTION     x 2  . CHOLECYSTECTOMY  2003  . COLONOSCOPY WITH PROPOFOL N/A 06/05/2019   Procedure: COLONOSCOPY WITH PROPOFOL;  Surgeon: Carol Ada, MD;  Location: [facility];  Service: Endoscopy;  Laterality: N/A;  . CYST REMOVAL WITH BONE GRAFT Left 10/18/2020   Procedure: BONE GRAFTING OF ENCHONDROMA MIDDLE [place] OF LEFT MIDDLE FINGER;  Surgeon: Daryll Brod, MD;  Location: [facility];  Service: Orthopedics;  Laterality: Left;  AXILLARY BLOCK  . DIAGNOSTIC LAPAROSCOPY  2015; 2017   lap hernia repair x2  . HERNIA REPAIR  [id number], [id number]   umbilical hernia repair  . IVC FILTER INSERTION  2003   Hx pulmonary embolus  . POLYPECTOMY  06/05/2019   Procedure: POLYPECTOMY;  Surgeon: Carol Ada, MD;  Location: [facility];  Service: Endoscopy;;  . SHOULDER ARTHROSCOPY W/ ROTATOR CUFF REPAIR  08/28/2017   right shoulder  . WRIST ARTHROSCOPY WITH DEBRIDEMENT Left 10/18/2020   Procedure: LEFT WRIST ARTHROSCOPY WITH DEBRIDEMENT;  Surgeon: Daryll Brod, MD;  Location: [facility];  Service: Orthopedics;  Laterality: Left;  AXILLARY BLOCK    Family Psychiatric History:   Family History:  Family History  Problem Relation Age of Onset  . Breast cancer Mother   . Breast cancer Maternal Grandmother   . Heart attack Father   . Diabetes Father   . Congestive Heart Failure Father     Social History:   Social History   Socioeconomic History  . Marital status: Married    Spouse name: Not on file  . Number of children: 3  . Years of education: Not on file  . Highest education level: Not on file  Occupational History  . Not on file  Tobacco Use  . Smoking status: Never Smoker  . Smokeless tobacco: Never Used  Vaping Use  . Vaping Use: Never used  Substance and Sexual Activity  . Alcohol use: Never  . Drug use: Never  . Sexual activity: Not on file  Other Topics  Concern  . Not on file  Social History Narrative   ** Merged History Encounter **       Social Determinants of Health   Financial Resource Strain: Not on file  Food Insecurity: Not on file  Transportation Needs: Not on file  Physical Activity: Not on file  Stress: Not on file  Social Connections: Not on file    Additional Social History:   Allergies:   Allergies  Allergen Reactions  . Acetaminophen-Codeine Swelling, Rash and Other (See Comments)    Tylenol with Codeine, Tylenol #3,  (facial swelling, hives)  . Other Hives and Other (See Comments)    Muscle relaxer that starts with a "T" caused hives (not tizanidine) January or February 2020- made the patient VERY sick  . Tramadol Hives and Other (See Comments)    Patient stated "I was trippin' and I do not want that ever again"  . Coconut  Oil Rash and Other (See Comments)    ANY coconut products   . Meloxicam Rash  . Tomato Rash    Metabolic Disorder Labs: No results found for: HGBA1C, MPG No results found for: PROLACTIN No results found for: CHOL, TRIG, HDL, CHOLHDL, VLDL, LDLCALC Lab Results  Component Value Date   TSH 2.220 03/25/2020    Therapeutic Level Labs: No results found for: LITHIUM No results found for: CBMZ No results found for: VALPROATE  Current Medications: Current Outpatient Medications  Medication Sig Dispense Refill  . acetaminophen (TYLENOL) 500 MG tablet Take 1,000 mg by mouth every 6 (six) hours as needed for moderate pain.    Marland Kitchen albuterol (VENTOLIN HFA) 108 (90 Base) MCG/ACT inhaler Inhale 2 puffs into the lungs every 6 (six) hours as needed for wheezing or shortness of breath.    Marland Kitchen BREZTRI AEROSPHERE 160-9-4.8 MCG/ACT AERO Inhale 2 puffs into the lungs in the morning and at bedtime.     . Cholecalciferol (VITAMIN D-3) 25 MCG (1000 UT) CAPS Take 1,000 Units by mouth daily.    . cycloSPORINE (RESTASIS) 0.05 % ophthalmic emulsion Place 1 drop into both eyes 2 (two) times daily.     .  flecainide (TAMBOCOR) 50 MG tablet Take 50 mg by mouth 2 (two) times daily.     . furosemide (LASIX) 40 MG tablet Take 40 mg by mouth daily with breakfast.     . metFORMIN (GLUCOPHAGE) 500 MG tablet Take 500 mg by mouth daily with breakfast.    . Multiple Vitamins-Minerals (PRESERVISION AREDS 2 PO) Take 2 capsules by mouth daily with breakfast.     . Omega-3 Fatty Acids (FISH OIL) 1000 MG CAPS Take 1,000 mg by mouth daily with breakfast.     . omeprazole (PRILOSEC) 40 MG capsule Take 40 mg by mouth daily.    Marland Kitchen oxyCODONE-acetaminophen (PERCOCET) 7.5-325 MG tablet Take 1 tablet by mouth every 4 (four) hours as needed for severe pain. 20 tablet 0  . OZEMPIC, 0.25 OR 0.5 MG/DOSE, 2 MG/1.5ML SOPN Inject 0.5 mg into the skin every Sunday.    . promethazine (PHENERGAN) 12.5 MG tablet Take 12.5 mg by mouth at bedtime.    . rivaroxaban (XARELTO) 20 MG TABS tablet Take 20 mg by mouth at bedtime.     Marland Kitchen tiZANidine (ZANAFLEX) 4 MG tablet Take 4 mg by mouth at bedtime as needed for muscle spasms.      No current facility-administered medications for this visit.    Musculoskeletal: Strength & Muscle Tone: within normal limits Gait & Station: normal Patient leans: N/A  Psychiatric Specialty Exam: Review of Systems  There were no vitals taken for this visit.There is no height or weight on file to calculate BMI.  General Appearance: Casual  Eye Contact:  Good  Speech:  Clear and Coherent  Volume:  Normal  Mood:  Anxious and Depressed  Affect:  Congruent  Thought Process:  Coherent  Orientation:  Full (Time, Place, and Person)  Thought Content:  Logical  Suicidal Thoughts:  No passive ideations   Homicidal Thoughts:  No  Memory:  Immediate;   Fair Recent;   Fair  Judgement:  Fair  Insight:  Good  Psychomotor Activity:  Normal  Concentration:  Concentration: Fair  Recall:  [employer] of Knowledge:Fair  Language: Good  Akathisia:  No  Handed:  Right  AIMS (if indicated):    Assets:   Communication Skills Desire for Improvement Resilience Social Support  ADL's:  Intact  Cognition:  WNL  Sleep:  Fair   Screenings: GAD-7   Flowsheet Row Counselor from 09/05/2020 in [facility]  Total GAD-7 Score 3    PHQ2-9   Flowsheet Row Counselor from 10/27/2020 in [facility] Counselor from 09/05/2020 in [facility]  PHQ-2 Total Score 4 0  PHQ-9 Total Score 14 --    Flowsheet Row Counselor from 10/27/2020 in [facility] Admission (Discharged) from 10/18/2020 in [facility] from 10/14/2020 in [facility] PREADMISSION TESTING  C-SSRS RISK CATEGORY No Risk No Risk No Risk      Assessment and Plan:  Patient to start intensive outpatient programming - will follow-up with patient regarding medication management -Consider initiating antidepressant i.e. Cymbalta and or  Zoloft  (Patient reported apprehensive with attending group session will follow up on 10/31/2020)   Treatment plan was reviewed and agreed upon by NP T. Bobby Rumpf and patient Julissa Browning need for group services   Derrill Center, NP 2/11/202211:15 AM

## 2020-10-30 ENCOUNTER — Encounter (HOSPITAL_COMMUNITY): Payer: Self-pay | Admitting: Family

## 2020-10-30 ENCOUNTER — Encounter (HOSPITAL_COMMUNITY): Payer: Self-pay | Admitting: Psychiatry

## 2020-10-30 NOTE — Progress Notes (Signed)
Virtual Visit via Video Note  I connected with Meghan [name] on 10/30/20 at  9:00 AM EST by a video enabled telemedicine application and verified that I am speaking with the correct person using two identifiers.  At orientation to the IOP program, Case Manager discussed the limitations of evaluation and management by telemedicine and the availability of in person appointments. The patient expressed understanding and agreed to proceed with virtual visits throughout the duration of the program.   Location:  Patient: Patient Home Provider: Home Office   History of Present Illness: MDD  Observations/Objective: Check In: Case Manager checked in with all participants to review discharge dates, insurance authorizations, work-related documents and needs from the treatment team. Client stated needs and engaged in discussion. Case Manager introduced new group member, with group members welcoming and starting the joining process. Client presents with severe depression and severe anxiety. Client denied any current SI/HI/psychosis.  Initial Therapeutic Activity: Counselor introduced Rolin Barry, [place] Chaplain to present information and discussion on Grief and Loss. Group members engaged in discussion, sharing how grief impacts them, what comforts them, what emotions are felt, labeling losses, etc. After guest speaker logged off, Counselor prompted group to spend 10-15 minutes journaling to process personal grief and loss situations. Counselor processed entries with group and client's identified areas for additional processing in individual therapy. Client noted loss of close relationships and death of best friend.  Second Therapeutic Activity: Counselor introduced [employer], representative with [place] to share about programming. Group Members asked questions and engaged in discussion, as [name] shared about Peer Support, Support Groups and the [employer]. Client stated that they  are interested in connecting with the [facility] Support Group and Grief and Loss Support Group..  Check Out: Counselor prompted group members to identify one self-care practice or productivity activity they would like to engage in today. Client plans to contact a friend. Client endorsed safety plan to be followed to prevent safety issues.  Assessment and Plan: Clinician recommends that Client remain in IOP treatment to better manage mental health symptoms, stabilization and to address treatment plan goals. Clinician recommends adherence to crisis/safety plan, taking medications as prescribed, and following up with medical professionals if any issues arise.   Follow Up Instructions: Clinician will send Webex link for next session. The Client was advised to call back or seek an in-person evaluation if the symptoms worsen or if the condition fails to improve as anticipated.     I provided 180 minutes of non-face-to-face time during this encounter.     Lise Auer, LCSW

## 2020-10-31 ENCOUNTER — Ambulatory Visit (HOSPITAL_COMMUNITY): Payer: Medicare Other | Admitting: Licensed Clinical Social Worker

## 2020-10-31 ENCOUNTER — Encounter (HOSPITAL_COMMUNITY): Payer: Self-pay

## 2020-10-31 ENCOUNTER — Other Ambulatory Visit: Payer: Self-pay

## 2020-10-31 ENCOUNTER — Other Ambulatory Visit (HOSPITAL_COMMUNITY): Payer: Medicare Other | Admitting: Psychiatry

## 2020-10-31 DIAGNOSIS — F329 Major depressive disorder, single episode, unspecified: Secondary | ICD-10-CM | POA: Diagnosis not present

## 2020-10-31 DIAGNOSIS — F331 Major depressive disorder, recurrent, moderate: Secondary | ICD-10-CM

## 2020-10-31 DIAGNOSIS — F4310 Post-traumatic stress disorder, unspecified: Secondary | ICD-10-CM

## 2020-10-31 NOTE — Progress Notes (Signed)
Virtual Visit via Video Note  I connected with Meghan [name] on 10/31/20 at  9:00 AM EST by a video enabled telemedicine application and verified that I am speaking with the correct person using two identifiers.  At orientation to the IOP program, Case Manager discussed the limitations of evaluation and management by telemedicine and the availability of in person appointments. The patient expressed understanding and agreed to proceed with virtual visits throughout the duration of the program.   Location:  Patient: Patient Home Provider: Home Office   History of Present Illness: MDD  Observations/Objective: Check In: Case Manager checked in with all participants to review discharge dates, insurance authorizations, work-related documents and needs from the treatment team. Client stated needs and engaged in discussion.   Initial Therapeutic Activity: Counselor facilitated therapeutic processing with group members to assess mood and current functioning, prompting group members to share about application of skills, progress and challenges in treatment/personal lives. Client reports progress towards communicating more effectively with family, setting healthier boundaries and expressing needs. Client presents with moderate depression and moderate anxiety. Client denied any current SI/HI/psychosis.  Second Therapeutic Activity: Counselor introduced the group to guided imagery principles. Counselor engaged group in practice called, Relaxation for Self-Esteem, which included deep breathing, positive affirmations, imagination engagement and grounding techniques. Counselor facilitated the script then checked in with group members on their experience and takeaways. Client stated that she was very relaxed and enjoyed the prompted activities.  Check Out: Counselor prompted group members to identify one self-care practice or productivity activity they would like to engage in today. Client plans to nap. Client  endorsed safety plan to be followed to prevent safety issues.  Assessment and Plan: Clinician recommends that Client remain in IOP treatment to better manage mental health symptoms, stabilization and to address treatment plan goals. Clinician recommends adherence to crisis/safety plan, taking medications as prescribed, and following up with medical professionals if any issues arise.   Follow Up Instructions: Clinician will send Webex link for next session. The Client was advised to call back or seek an in-person evaluation if the symptoms worsen or if the condition fails to improve as anticipated.     I provided 180 minutes of non-face-to-face time during this encounter.     Lise Auer, LCSW

## 2020-11-01 ENCOUNTER — Other Ambulatory Visit (HOSPITAL_COMMUNITY): Payer: Medicare Other | Admitting: Psychiatry

## 2020-11-01 ENCOUNTER — Other Ambulatory Visit: Payer: Self-pay

## 2020-11-01 ENCOUNTER — Encounter (HOSPITAL_COMMUNITY): Payer: Self-pay

## 2020-11-01 DIAGNOSIS — F331 Major depressive disorder, recurrent, moderate: Secondary | ICD-10-CM

## 2020-11-01 DIAGNOSIS — F329 Major depressive disorder, single episode, unspecified: Secondary | ICD-10-CM | POA: Diagnosis not present

## 2020-11-01 NOTE — Progress Notes (Signed)
Virtual Visit via Video Note  I connected with Meghan [name] on 11/01/20 at  9:00 AM EST by a video enabled telemedicine application and verified that I am speaking with the correct person using two identifiers.  At orientation to the IOP program, Case Manager discussed the limitations of evaluation and management by telemedicine and the availability of in person appointments. The patient expressed understanding and agreed to proceed with virtual visits throughout the duration of the program.   Location:  Patient: Patient Home Provider: Home Office   History of Present Illness: MDD  Observations/Objective: Check In: Case Manager checked in with all participants to review discharge dates, insurance authorizations, work-related documents and needs from the treatment team. Client stated needs and engaged in discussion. Case Manager welcomed 2 new group members, with group introducing self and starting the joining process.   Initial Therapeutic Activity: Counselor facilitated therapeutic processing with group members to assess mood and current functioning, prompting group members to share about application of skills, progress and challenges in treatment/personal lives. Client reports that she is experiencing anxiety about upcoming dental appointment due to past dental trauma. Counselor provided coping strategies to manage medical anxiety. Client presents with mild depression and moderate anxiety. Client denied any current SI/HI/psychosis.  Second Therapeutic Activity: Counselor closed program by allowing time to celebrate a graduating group member. Counselor shared reflections on progress and allow space for group members to share well wishes and appreciates to the graduating client. Counselor prompted graduating client to share takeaways, reflect on progress and final thoughts for the group.  Check Out: Counselor prompted group members to identify one self-care practice or productivity activity they  would like to engage in today. Client plans to go into the community. Client endorsed safety plan to be followed to prevent safety issues.  Assessment and Plan: Clinician recommends that Client remain in IOP treatment to better manage mental health symptoms, stabilization and to address treatment plan goals. Clinician recommends adherence to crisis/safety plan, taking medications as prescribed, and following up with medical professionals if any issues arise.   Follow Up Instructions: Clinician will send Webex link for next session. The Client was advised to call back or seek an in-person evaluation if the symptoms worsen or if the condition fails to improve as anticipated.     I provided 180 minutes of non-face-to-face time during this encounter.     Lise Auer, LCSW

## 2020-11-02 ENCOUNTER — Other Ambulatory Visit (HOSPITAL_COMMUNITY): Payer: Medicare Other | Admitting: Psychiatry

## 2020-11-02 ENCOUNTER — Encounter (HOSPITAL_COMMUNITY): Payer: Self-pay

## 2020-11-02 ENCOUNTER — Other Ambulatory Visit: Payer: Self-pay

## 2020-11-02 DIAGNOSIS — F331 Major depressive disorder, recurrent, moderate: Secondary | ICD-10-CM

## 2020-11-02 DIAGNOSIS — F329 Major depressive disorder, single episode, unspecified: Secondary | ICD-10-CM | POA: Diagnosis not present

## 2020-11-02 NOTE — Progress Notes (Signed)
Virtual Visit via Video Note  I connected with Meghan [name] on 11/02/20 at  9:00 AM EST by a video enabled telemedicine application and verified that I am speaking with the correct person using two identifiers.  At orientation to the IOP program, Case Manager discussed the limitations of evaluation and management by telemedicine and the availability of in person appointments. The patient expressed understanding and agreed to proceed with virtual visits throughout the duration of the program.   Location:  Patient: Patient Home Provider: Home Office   History of Present Illness: MDD  Observations/Objective: Check In: Case Manager checked in with all participants to review discharge dates, insurance authorizations, work-related documents and needs from the treatment team. Client stated needs and engaged in discussion. Case Manager welcomed 2 new group members, with group introducing self and starting the joining process.   Initial Therapeutic Activity: Counselor facilitated therapeutic processing with group members to assess mood and current functioning, prompting group members to share about application of skills, progress and challenges in treatment/personal lives. Client reports that her dental appointment went well with manageable anxiety, noting coping skills utlized. Client notes that depression is decreasing and is more energized and motivated to be active in the afternoons. Client presents with mild depression and mild anxiety. Client denied any current SI/HI/psychosis.  Second Therapeutic Activity: Counselor introduced Agricultural consultant, Demetra Shiner, Director of Wellness to present information on [employer] and Benefits. Clients engaged in activities and discussion, personalizing the information to their individual circumstances. Client motivated to make small changes in wellness practices to improve overall mental health.  Check Out: Counselor closed program by allowing time to  celebrate a graduating group member. Counselor shared reflections on progress and allow space for group members to share well wishes and appreciates to the graduating client. Counselor prompted graduating client to share takeaways, reflect on progress and final thoughts for the group. Client endorsed safety plan to be followed to prevent safety issues.  Assessment and Plan: Clinician recommends that Client remain in IOP treatment to better manage mental health symptoms, stabilization and to address treatment plan goals. Clinician recommends adherence to crisis/safety plan, taking medications as prescribed, and following up with medical professionals if any issues arise.   Follow Up Instructions: Clinician will send Webex link for next session. The Client was advised to call back or seek an in-person evaluation if the symptoms worsen or if the condition fails to improve as anticipated.     I provided 180 minutes of non-face-to-face time during this encounter.

## 2020-11-03 ENCOUNTER — Other Ambulatory Visit (HOSPITAL_COMMUNITY): Payer: Medicare Other | Admitting: Psychiatry

## 2020-11-03 ENCOUNTER — Other Ambulatory Visit: Payer: Self-pay

## 2020-11-03 ENCOUNTER — Encounter (HOSPITAL_COMMUNITY): Payer: Self-pay

## 2020-11-03 ENCOUNTER — Ambulatory Visit (HOSPITAL_COMMUNITY): Payer: Medicare Other

## 2020-11-03 DIAGNOSIS — F329 Major depressive disorder, single episode, unspecified: Secondary | ICD-10-CM | POA: Diagnosis not present

## 2020-11-03 DIAGNOSIS — F331 Major depressive disorder, recurrent, moderate: Secondary | ICD-10-CM

## 2020-11-03 NOTE — Progress Notes (Signed)
Virtual Visit via Video Note  I connected with Meghan [name] on 11/03/20 at  9:00 AM EST by a video enabled telemedicine application and verified that I am speaking with the correct person using two identifiers.  At orientation to the IOP program, Case Manager discussed the limitations of evaluation and management by telemedicine and the availability of in person appointments. The patient expressed understanding and agreed to proceed with virtual visits throughout the duration of the program.   Location:  Patient: Patient Home Provider: Home Office   History of Present Illness: MDD  Observations/Objective: Check In: Case Manager checked in with all participants to review discharge dates, insurance authorizations, work-related documents and needs from the treatment team. Client stated needs and engaged in discussion. Case Manager welcomed a new group member, with group introducing self and starting the joining process.   Initial Therapeutic Activity: Counselor facilitated therapeutic processing with group members to assess mood and current functioning, prompting group members to share about application of skills, progress and challenges in treatment/personal lives. Client reports that she joined a gym and is looking forward to apartment hunting in a new city. Client is focusing on healthier communications with family and in her parenting. Client presents with moderate depression and modeate anxiety. Client denied any current SI/HI/psychosis.  Second Therapeutic Activity: Counselor introduced [place], [place] Chaplain to present information and discussion on Grief and Loss. Group members engaged in discussion, sharing how grief impacts them, what comforts them, what emotions are felt, labeling losses, etc. After guest speaker logged off, Counselor prompted group to spend 10-15 minutes journaling to process personal grief and loss situations. Counselor processed entries with group and client's  identified areas for additional processing in individual therapy. 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  Check Out: Counselor closed program by allowing time to celebrate a graduating group member. Counselor shared reflections on progress and allow space for group members to share well wishes and appreciates to the graduating client. Counselor prompted graduating client to share takeaways, reflect on progress and final thoughts for the group. Client endorsed safety plan to be followed to prevent safety issues.  Assessment and Plan: Clinician recommends that Client remain in IOP treatment to better manage mental health symptoms, stabilization and to address treatment plan goals. Clinician recommends adherence to crisis/safety plan, taking medications as prescribed, and following up with medical professionals if any issues arise.   Follow Up Instructions: Clinician will send Webex link for next session. The Client was advised to call back or seek an in-person evaluation if the symptoms worsen or if the condition fails to improve as anticipated.     I provided 180 minutes of non-face-to-face time during this encounter.     Lise Auer, LCSW

## 2020-11-04 ENCOUNTER — Encounter (HOSPITAL_COMMUNITY): Payer: Self-pay

## 2020-11-04 ENCOUNTER — Other Ambulatory Visit: Payer: Self-pay

## 2020-11-04 ENCOUNTER — Other Ambulatory Visit (HOSPITAL_COMMUNITY): Payer: Medicare Other | Admitting: Psychiatry

## 2020-11-04 DIAGNOSIS — F329 Major depressive disorder, single episode, unspecified: Secondary | ICD-10-CM | POA: Diagnosis not present

## 2020-11-04 DIAGNOSIS — F331 Major depressive disorder, recurrent, moderate: Secondary | ICD-10-CM

## 2020-11-04 NOTE — Progress Notes (Signed)
Virtual Visit via Video Note  I connected with Meghan [name] on 11/04/20 at  9:00 AM EST by a video enabled telemedicine application and verified that I am speaking with the correct person using two identifiers.  At orientation to the IOP program, Case Manager discussed the limitations of evaluation and management by telemedicine and the availability of in person appointments. The patient expressed understanding and agreed to proceed with virtual visits throughout the duration of the program.   Location:  Patient: Patient Home Provider: Home Office   History of Present Illness: MDD  Observations/Objective: Check In: Case Manager checked in with all participants to review discharge dates, insurance authorizations, work-related documents and needs from the treatment team. Client stated needs and engaged in discussion.   Initial Therapeutic Activity: Counselor facilitated therapeutic processing with group members to assess mood and current functioning, prompting group members to share about application of skills, progress and challenges in treatment/personal lives. Client reports that she is tired, hasn't slept well this week, dealing with noise violations of neighbors and recovering from procedures. Client presents with moderate depression and moderate anxiety. Client denied any current SI/HI/psychosis.  Second Therapeutic Activity: Counselor facilitated the creation of a [place] and [place] with group members. Counselor prompted group members to identify warning signs, coping skills, distractions, support people, providers and crisis numbers and safety considerations. Group members completed form and shared their responses.   Check Out: Client prompted group members to share one or more motivators for living. Client named watching her grandchildren graduate as their primary reason for living and staying safe. Client endorsed safety plan to be followed to prevent safety  issues.  Assessment and Plan: Clinician recommends that Client remain in IOP treatment to better manage mental health symptoms, stabilization and to address treatment plan goals. Clinician recommends adherence to crisis/safety plan, taking medications as prescribed, and following up with medical professionals if any issues arise.   Follow Up Instructions: Clinician will send Webex link for next session. The Client was advised to call back or seek an in-person evaluation if the symptoms worsen or if the condition fails to improve as anticipated.     I provided 180 minutes of non-face-to-face time during this encounter.     Lise Auer, LCSW

## 2020-11-06 ENCOUNTER — Other Ambulatory Visit: Payer: Self-pay

## 2020-11-06 ENCOUNTER — Emergency Department (HOSPITAL_COMMUNITY): Payer: Medicare Other

## 2020-11-06 ENCOUNTER — Emergency Department (HOSPITAL_COMMUNITY)
Admission: EM | Admit: 2020-11-06 | Discharge: 2020-11-06 | Disposition: A | Discharge status: Home / Self Care | Payer: Medicare Other | Attending: Emergency Medicine | Admitting: Emergency Medicine

## 2020-11-06 ENCOUNTER — Encounter (HOSPITAL_COMMUNITY): Payer: Self-pay

## 2020-11-06 DIAGNOSIS — D649 Anemia, unspecified: Secondary | ICD-10-CM | POA: Insufficient documentation

## 2020-11-06 DIAGNOSIS — I509 Heart failure, unspecified: Secondary | ICD-10-CM | POA: Diagnosis not present

## 2020-11-06 DIAGNOSIS — Z79899 Other long term (current) drug therapy: Secondary | ICD-10-CM | POA: Diagnosis not present

## 2020-11-06 DIAGNOSIS — R569 Unspecified convulsions: Secondary | ICD-10-CM | POA: Diagnosis present

## 2020-11-06 DIAGNOSIS — Z8673 Personal history of transient ischemic attack (TIA), and cerebral infarction without residual deficits: Secondary | ICD-10-CM | POA: Diagnosis not present

## 2020-11-06 DIAGNOSIS — J45909 Unspecified asthma, uncomplicated: Secondary | ICD-10-CM | POA: Diagnosis not present

## 2020-11-06 DIAGNOSIS — Z7984 Long term (current) use of oral hypoglycemic drugs: Secondary | ICD-10-CM | POA: Insufficient documentation

## 2020-11-06 DIAGNOSIS — E119 Type 2 diabetes mellitus without complications: Secondary | ICD-10-CM | POA: Insufficient documentation

## 2020-11-06 LAB — COMPREHENSIVE METABOLIC PANEL
ALT: 12 U/L (ref 0–44)
AST: 13 U/L — ABNORMAL LOW (ref 15–41)
Albumin: 3.5 g/dL (ref 3.5–5.0)
Alkaline Phosphatase: 198 U/L — ABNORMAL HIGH (ref 38–126)
Anion gap: 8 (ref 5–15)
BUN: 15 mg/dL (ref 6–20)
CO2: 28 mmol/L (ref 22–32)
Calcium: 9.6 mg/dL (ref 8.9–10.3)
Chloride: 101 mmol/L (ref 98–111)
Creatinine, Ser: 0.83 mg/dL (ref 0.44–1.00)
GFR, Estimated: >60 mL/min (ref >60)
Glucose, Bld: 103 mg/dL — ABNORMAL HIGH (ref 70–99)
Potassium: 3.8 mmol/L (ref 3.5–5.1)
Sodium: 137 mmol/L (ref 135–145)
Total Bilirubin: 0.4 mg/dL (ref 0.3–1.2)
Total Protein: 7.4 g/dL (ref 6.5–8.1)

## 2020-11-06 LAB — CBC WITH DIFFERENTIAL/PLATELET
Abs Immature Granulocytes: 0.02 10*3/uL (ref 0.00–0.07)
Basophils Absolute: 0 10*3/uL (ref 0.0–0.1)
Basophils Relative: 0 %
Eosinophils Absolute: 0.1 10*3/uL (ref 0.0–0.5)
Eosinophils Relative: 1 %
HCT: 36 % (ref 36.0–46.0)
Hemoglobin: 11.5 g/dL — ABNORMAL LOW (ref 12.0–15.0)
Immature Granulocytes: 0 %
Lymphocytes Relative: 27 %
Lymphs Abs: 1.9 10*3/uL (ref 0.7–4.0)
MCH: 26.4 pg (ref 26.0–34.0)
MCHC: 31.9 g/dL (ref 30.0–36.0)
MCV: 82.6 fL (ref 80.0–100.0)
Monocytes Absolute: 0.6 10*3/uL (ref 0.1–1.0)
Monocytes Relative: 8 %
Neutro Abs: 4.6 10*3/uL (ref 1.7–7.7)
Neutrophils Relative %: 64 %
Platelets: 267 10*3/uL (ref 150–400)
RBC: 4.36 MIL/uL (ref 3.87–5.11)
RDW: 14.7 % (ref 11.5–15.5)
WBC: 7.3 10*3/uL (ref 4.0–10.5)
nRBC: 0 % (ref 0.0–0.2)

## 2020-11-06 LAB — ETHANOL: Alcohol, Ethyl (B): <10 mg/dL (ref <10)

## 2020-11-06 MED ORDER — LEVETIRACETAM 500 MG PO TABS: 500.0000 mg | ORAL_TABLET | Freq: Two times a day (BID) | ORAL | 0 refills | Status: DC

## 2020-11-06 MED ORDER — LEVETIRACETAM IN NACL 1000 MG/100ML IV SOLN
1000.0000 mg | Freq: Once | INTRAVENOUS | Status: AC
  Administered 2020-11-06: 1000 mg via INTRAVENOUS
  Filled 2020-11-06: qty 100

## 2020-11-06 NOTE — Discharge Instructions (Addendum)
Follow-up with your family doctor within the week. Return if any problems. Do not do any driving or put yourself in position that if you have a seizure you will hurt yourself severely. For example do not get up on ladders.

## 2020-11-06 NOTE — ED Provider Notes (Signed)
[facility] DEPT Provider Note   CSN: [id number] Arrival date & time: 11/06/20  1514     History Chief Complaint  Patient presents with  . Seizures    Meghan [name] is a 55 y.o. female.  Patient had a seizure today. Patient has a history of seizures but has not had one in a long time and has not been on any medicine for quite some time. Patient took [place] before.  The history is provided by medical records, a friend and the patient. No language interpreter was used.  Seizures Seizure activity on arrival: no   Seizure type:  Grand mal Preceding symptoms: aura   Initial focality:  Multifocal Episode characteristics: no apnea   Postictal symptoms: confusion   Return to baseline: no   Severity:  Moderate Timing:  Once      Past Medical History:  Diagnosis Date  . Arthritis    left knee  . Asthma   . Atrial fibrillation ([place])   . Atrial fibrillation ([place]) 02/16/2019  . CHF (congestive heart failure) ([place]) 01/2019   Physician from [place] informed her she had this   . Depression   . Dysrhythmia    Atrial fibrillation  . GERD (gastroesophageal reflux disease)   . Hiatal hernia   . History of pulmonary embolus (PE)   . Hypercholesterolemia   . Macular degeneration    of left eye  . Osteoarthritis    knees  . Pneumonia    in the past  . Pre-diabetes   . Pulmonary embolism ([place])    bilat  . Seizure ([place])   . Seizure disorder ([place]) 2017  . Sleep apnea CPAP   [facility]  . Stroke [facility])    1989 and 1995 (left sided weakness)  . Vitamin D deficiency     Patient Active Problem List   Diagnosis Date Noted  . Left-sided weakness 09/29/2020  . Asthma 09/29/2020  . Pre-diabetes 09/29/2020  . Pain in left finger(s) 07/12/2020  . Seizure-like activity ([place]) 03/25/2020  . History of head injury 03/25/2020  . Obstructive sleep apnea syndrome 09/04/2019  . Acute medial meniscus tear of right knee 07/10/2019  . Class 3 severe obesity due to  excess calories with serious comorbidity and body mass index (BMI) of 50.0 to 59.9 in adult ([place]) 06/01/2019  . Atrial fibrillation ([place]) 04/17/2019  . History of pulmonary embolus (PE) 04/17/2019  . Type 2 diabetes mellitus, without long-term current use of insulin ([place]) 04/17/2019    Past Surgical History:  Procedure Laterality Date  . ABDOMINAL HYSTERECTOMY  2003   complete  . CARDIAC CATHETERIZATION  2017   in [facility] in [place]  . CESAREAN SECTION  4765,4650   x 2  . CESAREAN SECTION     x 2  . CHOLECYSTECTOMY  2003  . COLONOSCOPY WITH PROPOFOL N/A 06/05/2019   Procedure: COLONOSCOPY WITH PROPOFOL;  Surgeon: Carol Ada, MD;  Location: [facility];  Service: Endoscopy;  Laterality: N/A;  . CYST REMOVAL WITH BONE GRAFT Left 10/18/2020   Procedure: BONE GRAFTING OF ENCHONDROMA MIDDLE [place] OF LEFT MIDDLE FINGER;  Surgeon: Daryll Brod, MD;  Location: [facility];  Service: Orthopedics;  Laterality: Left;  AXILLARY BLOCK  . DIAGNOSTIC LAPAROSCOPY  2015; 2017   lap hernia repair x2  . HERNIA REPAIR  3546, 5681   umbilical hernia repair  . IVC FILTER INSERTION  2003   Hx pulmonary embolus  . POLYPECTOMY  06/05/2019   Procedure: POLYPECTOMY;  Surgeon: Carol Ada, MD;  Location: [facility];  Service: Endoscopy;;  . SHOULDER ARTHROSCOPY W/ ROTATOR CUFF REPAIR  08/28/2017   right shoulder  . WRIST ARTHROSCOPY WITH DEBRIDEMENT Left 10/18/2020   Procedure: LEFT WRIST ARTHROSCOPY WITH DEBRIDEMENT;  Surgeon: Daryll Brod, MD;  Location: [facility];  Service: Orthopedics;  Laterality: Left;  AXILLARY BLOCK     OB History   No obstetric history on file.     Family History  Problem Relation Age of Onset  . Breast cancer Mother   . Breast cancer Maternal Grandmother   . Heart attack Father   . Diabetes Father   . Congestive Heart Failure Father     Social History   Tobacco Use  . Smoking status: Never Smoker  . Smokeless tobacco: Never Used  Vaping Use  . Vaping Use:  Never used  Substance Use Topics  . Alcohol use: Never  . Drug use: Never    Home Medications Prior to Admission medications   Medication Sig Start Date End Date Taking? Authorizing Provider  levETIRAcetam (KEPPRA) 500 MG tablet Take 1 tablet (500 mg total) by mouth 2 (two) times daily. 11/06/20  Yes Milton Ferguson, MD  acetaminophen (TYLENOL) 500 MG tablet Take 1,000 mg by mouth every 6 (six) hours as needed for moderate pain.    [provider]  albuterol (VENTOLIN HFA) 108 (90 Base) MCG/ACT inhaler Inhale 2 puffs into the lungs every 6 (six) hours as needed for wheezing or shortness of breath.    [provider]  BREZTRI AEROSPHERE 160-9-4.8 MCG/ACT AERO Inhale 2 puffs into the lungs in the morning and at bedtime.  03/24/20   [provider]  Cholecalciferol (VITAMIN D-3) 25 MCG (1000 UT) CAPS Take 1,000 Units by mouth daily.    [provider]  cycloSPORINE (RESTASIS) 0.05 % ophthalmic emulsion Place 1 drop into both eyes 2 (two) times daily.     [provider]  flecainide (TAMBOCOR) 50 MG tablet Take 50 mg by mouth 2 (two) times daily.  09/04/19   [provider]  furosemide (LASIX) 40 MG tablet Take 40 mg by mouth daily with breakfast.     [provider]  metFORMIN (GLUCOPHAGE) 500 MG tablet Take 500 mg by mouth daily with breakfast.    [provider]  Multiple Vitamins-Minerals (PRESERVISION AREDS 2 PO) Take 2 capsules by mouth daily with breakfast.     [provider]  Omega-3 Fatty Acids (FISH OIL) 1000 MG CAPS Take 1,000 mg by mouth daily with breakfast.     [provider]  omeprazole (PRILOSEC) 40 MG capsule Take 40 mg by mouth daily. 08/17/20   [provider]  oxyCODONE-acetaminophen (PERCOCET) 7.5-325 MG tablet Take 1 tablet by mouth every 4 (four) hours as needed for severe pain. 10/18/20 10/18/21  Daryll Brod, MD  OZEMPIC, 0.25 OR 0.5 MG/DOSE, 2 MG/1.5ML SOPN Inject 0.5 mg into the  skin every Sunday. 12/18/19   [provider]  promethazine (PHENERGAN) 12.5 MG tablet Take 12.5 mg by mouth at bedtime. 09/28/20   [provider]  rivaroxaban (XARELTO) 20 MG TABS tablet Take 20 mg by mouth at bedtime.     [provider]  tiZANidine (ZANAFLEX) 4 MG tablet Take 4 mg by mouth at bedtime as needed for muscle spasms.     [provider]    Allergies    Acetaminophen-codeine, Other, Tramadol, Coconut oil, Meloxicam, and Tomato  Review of Systems   Review of Systems  Constitutional: Negative for appetite change and fatigue.  HENT: Negative for congestion, ear discharge and sinus pressure.   Eyes: Negative for discharge.  Respiratory: Negative for cough.   Cardiovascular: Negative for chest pain.  Gastrointestinal: Negative for abdominal pain and diarrhea.  Genitourinary: Negative for frequency and hematuria.  Musculoskeletal: Negative for back pain.  Skin: Negative for rash.  Neurological: Positive for seizures. Negative for headaches.  Psychiatric/Behavioral: Negative for hallucinations.    Physical Exam Updated Vital Signs BP (!) 150/92   Pulse 77   Temp 98 F (36.7 C) (Oral)   Resp 18   SpO2 99%   Physical Exam Vitals and nursing note reviewed.  Constitutional:      Appearance: She is well-developed.  HENT:     Head: Normocephalic.     Nose: Nose normal.  Eyes:     General: No scleral icterus.    Extraocular Movements: EOM normal.     Conjunctiva/sclera: Conjunctivae normal.  Neck:     Thyroid: No thyromegaly.  Cardiovascular:     Rate and Rhythm: Normal rate and regular rhythm.     Heart sounds: No murmur heard. No friction rub. No gallop.   Pulmonary:     Breath sounds: No stridor. No wheezing or rales.  Chest:     Chest wall: No tenderness.  Abdominal:     General: There is no distension.     Tenderness: There is no abdominal tenderness. There is no rebound.  Musculoskeletal:        General: No edema.  Normal range of motion.     Cervical back: Neck supple.  Lymphadenopathy:     Cervical: No cervical adenopathy.  Skin:    Findings: No erythema or rash.  Neurological:     Mental Status: She is alert and oriented to person, place, and time.     Motor: No abnormal muscle tone.     Coordination: Coordination normal.  Psychiatric:        Mood and Affect: Mood and affect normal.        Behavior: Behavior normal.     ED Results / Procedures / Treatments   Labs (all labs ordered are listed, but only abnormal results are displayed) Labs Reviewed  CBC WITH DIFFERENTIAL/PLATELET - Abnormal; Notable for the following components:      Result Value   Hemoglobin 11.5 (*)    All other components within normal limits  COMPREHENSIVE METABOLIC PANEL - Abnormal; Notable for the following components:   Glucose, Bld 103 (*)    AST 13 (*)    Alkaline Phosphatase 198 (*)    All other components within normal limits  ETHANOL  RAPID URINE DRUG SCREEN, HOSP PERFORMED    EKG None  Radiology CT Head Wo Contrast  Result Date: 11/06/2020 CLINICAL DATA:  Seizure today. EXAM: CT HEAD WITHOUT CONTRAST TECHNIQUE: Contiguous axial images were obtained from the base of the skull through the vertex without intravenous contrast. COMPARISON:  April 04, 2020. FINDINGS: Brain: No evidence of acute infarction, hemorrhage, hydrocephalus, extra-axial collection or mass lesion/mass effect. Vascular: No hyperdense vessel or unexpected calcification. Skull: Normal. Negative for fracture or focal lesion. Sinuses/Orbits: No acute finding. Other: None. IMPRESSION: No acute intracranial abnormalities. No cause for the patient's symptoms identified. Electronically Signed   By: Dorise Bullion III M.D   On: 11/06/2020 17:53    Procedures Procedures   Medications Ordered in ED Medications  levETIRAcetam (KEPPRA) IVPB 1000 mg/100 mL premix (1,000 mg Intravenous New Bag/Given 11/06/20 1716)  ED Course  I have reviewed  the triage vital signs and the nursing notes.  Pertinent labs & imaging results that were available during my care of the patient were reviewed by me and considered in my medical decision making (see chart for details). CRITICAL CARE Performed by: Milton Ferguson Total critical care time: 45 minutes Critical care time was exclusive of separately billable procedures and treating other patients. Critical care was necessary to treat or prevent imminent or life-threatening deterioration. Critical care was time spent personally by me on the following activities: development of treatment plan with patient and/or surrogate as well as nursing, discussions with consultants, evaluation of patient's response to treatment, examination of patient, obtaining history from patient or surrogate, ordering and performing treatments and interventions, ordering and review of laboratory studies, ordering and review of radiographic studies, pulse oximetry and re-evaluation of patient's condition.    MDM Rules/Calculators/A&P                          Labs and CT scan unremarkable except for mild anemia. Patient will be placed back on Keppra and she states she has a appointment with neurology in a month. She was told to follow-up with her primary care doctor before that time and return if any problems Final Clinical Impression(s) / ED Diagnoses Final diagnoses:  Seizure [facility])    Rx / DC Orders ED Discharge Orders         Ordered    levETIRAcetam (KEPPRA) 500 MG tablet  2 times daily        11/06/20 1857           Milton Ferguson, MD 11/06/20 1900

## 2020-11-06 NOTE — ED Triage Notes (Addendum)
Pt BIB EMS from restaurant. Pt c/o feeling an aura, had two seizures lasting 30 sec each. Seizures took place in car. Pt denies falls, denies hitting head. Seizures witnessed. Pt has hx of seizures. Pt A&Ox4 at this time. Pt states she does not remember the seizures. Pt states she does not take seizure meds.   130/54 78 HR 97% RA CBG 125

## 2020-11-07 ENCOUNTER — Ambulatory Visit (HOSPITAL_COMMUNITY): Payer: Medicare Other | Admitting: Licensed Clinical Social Worker

## 2020-11-07 ENCOUNTER — Telehealth (HOSPITAL_COMMUNITY): Payer: Self-pay | Admitting: Licensed Clinical Social Worker

## 2020-11-07 ENCOUNTER — Other Ambulatory Visit (HOSPITAL_COMMUNITY): Payer: Medicare Other | Admitting: Psychiatry

## 2020-11-07 DIAGNOSIS — F329 Major depressive disorder, single episode, unspecified: Secondary | ICD-10-CM | POA: Diagnosis not present

## 2020-11-07 DIAGNOSIS — F4310 Post-traumatic stress disorder, unspecified: Secondary | ICD-10-CM

## 2020-11-07 DIAGNOSIS — F331 Major depressive disorder, recurrent, moderate: Secondary | ICD-10-CM

## 2020-11-07 NOTE — Telephone Encounter (Signed)
Clinician had a virtual therapy appointment scheduled with Meghan [name] today at 2pm.  Clinician contacted Meghan [name] by phone when audio issues arose during virtual session, and learned that Meghan [name] is still approved by insurance to attend [facility]HIOP groups through remainder of this week, and plans to resume individual outpatient therapy with clinician in following week as long as condition remains stable.  Meghan [name] reported that she thought today's session had already been cancelled over 1 week ago and apologized for confusion.  Clinician expressed understanding and assured her there would be no charge for this error.  Clinician informed front desk of this scheduling mistake and will continue to monitor.    [name], LCSW, LCAS 11/07/20

## 2020-11-08 ENCOUNTER — Encounter (HOSPITAL_COMMUNITY): Payer: Self-pay

## 2020-11-08 ENCOUNTER — Other Ambulatory Visit (HOSPITAL_COMMUNITY): Payer: Medicare Other | Admitting: Psychiatry

## 2020-11-08 ENCOUNTER — Other Ambulatory Visit: Payer: Self-pay

## 2020-11-08 DIAGNOSIS — F331 Major depressive disorder, recurrent, moderate: Secondary | ICD-10-CM

## 2020-11-08 DIAGNOSIS — F329 Major depressive disorder, single episode, unspecified: Secondary | ICD-10-CM | POA: Diagnosis not present

## 2020-11-08 NOTE — Progress Notes (Signed)
Virtual Visit via Video Note  I connected with Meghan [name] on 11/08/20 at  9:00 AM EST by a video enabled telemedicine application and verified that I am speaking with the correct person using two identifiers.  At orientation to the IOP program, Case Manager discussed the limitations of evaluation and management by telemedicine and the availability of in person appointments. The patient expressed understanding and agreed to proceed with virtual visits throughout the duration of the program.   Location:  Patient: Patient Home Provider: Home Office   History of Present Illness: MDD  Observations/Objective: Check In: Case Manager checked in with all participants to review discharge dates, insurance authorizations, work-related documents and needs from the treatment team. Client stated needs and engaged in discussion.   Initial Therapeutic Activity: Counselor facilitated therapeutic processing with group members to assess mood and current functioning, prompting group members to share about application of skills, progress and challenges in treatment/personal lives. Client reports that she had a successful conversation with her sister, feeling better about their connection and moving forward in their relationship. Client presents with mild depression and mild anxiety. Client denied any current SI/HI/psychosis.  Second Therapeutic Activity: Counselor provided group with Anxiety Igniting Thoughts Assessment, from [place], based off the book, the Anxious Brain. Counselor facilitated assessments, with group evaluating Pessimistic, Obsessive, Worrying, Perfectionism, Guilt/Shame, Right Brain Hemisphere Thinking. Group members shared their scores and reflections on the activity. Client noted that she scored high in catastrophizing, worrying and guilt and shame, connecting back to childhood experiences.  Check Out: Client prompted group members to share one productivity activity or one self-care  practice they can engage in today. Client plans to rest and refill prescriptions. Client endorsed safety plan to be followed to prevent safety issues.  Assessment and Plan: Clinician recommends that Client remain in IOP treatment to better manage mental health symptoms, stabilization and to address treatment plan goals. Clinician recommends adherence to crisis/safety plan, taking medications as prescribed, and following up with medical professionals if any issues arise.   Follow Up Instructions: Clinician will send Webex link for next session. The Client was advised to call back or seek an in-person evaluation if the symptoms worsen or if the condition fails to improve as anticipated.     I provided 180 minutes of non-face-to-face time during this encounter.     Lise Auer, LCSW

## 2020-11-08 NOTE — Progress Notes (Signed)
Virtual Visit via Video Note  I connected with Meghan [name] on 11/07/2020 at  9:00 AM EST by a video enabled telemedicine application and verified that I am speaking with the correct person using two identifiers.  At orientation to the IOP program, Case Manager discussed the limitations of evaluation and management by telemedicine and the availability of in person appointments. The patient expressed understanding and agreed to proceed with virtual visits throughout the duration of the program.   Location:  Patient: Patient Home Provider: Home Office   History of Present Illness: MDD and PTSD  Observations/Objective: Check In: Case Manager checked in with all participants to review discharge dates, insurance authorizations, work-related documents and needs from the treatment team. Client stated needs and engaged in discussion.   Initial Therapeutic Activity: Counselor facilitated therapeutic processing with group members to assess mood and current functioning, prompting group members to share about application of skills, progress and challenges in treatment/personal lives. Client reports having a Grand Mal Seizure over the weekend, currently feeling exhausted. Despite exhaustion Client participated well in session and presents as positive and optimistic. Client presents with moderate depression and moderate anxiety. Client denied any current SI/HI/psychosis.  Second Therapeutic Activity: Counselor facilitated the discussion around "How we think about our thoughts", before providing psychoeducation on Cognitive Distortions. Counselor provided a video outlining 15 common Styles of Distorted Thinking, prompting group members to take note of those they resonate with. Group engaged in discussion, sharing specific examples of thought processes.   Check Out: Client prompted group members to share one productivity activity or one self-care practice they can engage in today. Client plans to contact sister  to apologize for past assumptions/offenses. Client endorsed safety plan to be followed to prevent safety issues.  Assessment and Plan: Clinician recommends that Client remain in IOP treatment to better manage mental health symptoms, stabilization and to address treatment plan goals. Clinician recommends adherence to crisis/safety plan, taking medications as prescribed, and following up with medical professionals if any issues arise.   Follow Up Instructions: Clinician will send Webex link for next session. The Client was advised to call back or seek an in-person evaluation if the symptoms worsen or if the condition fails to improve as anticipated.     I provided 180 minutes of non-face-to-face time during this encounter.     Lise Auer, LCSW

## 2020-11-09 ENCOUNTER — Other Ambulatory Visit: Payer: Self-pay

## 2020-11-09 ENCOUNTER — Encounter (HOSPITAL_COMMUNITY): Payer: Self-pay

## 2020-11-09 ENCOUNTER — Other Ambulatory Visit (HOSPITAL_COMMUNITY): Payer: Medicare Other | Admitting: Psychiatry

## 2020-11-09 DIAGNOSIS — F331 Major depressive disorder, recurrent, moderate: Secondary | ICD-10-CM

## 2020-11-09 DIAGNOSIS — F329 Major depressive disorder, single episode, unspecified: Secondary | ICD-10-CM | POA: Diagnosis not present

## 2020-11-09 NOTE — Progress Notes (Signed)
Virtual Visit via Video Note  I connected with Meghan [name] on 11/09/20 at  9:00 AM EST by a video enabled telemedicine application and verified that I am speaking with the correct person using two identifiers.  At orientation to the IOP program, Case Manager discussed the limitations of evaluation and management by telemedicine and the availability of in person appointments. The patient expressed understanding and agreed to proceed with virtual visits throughout the duration of the program.   Location:  Patient: Patient Home Provider: Home Office   History of Present Illness: MDD  Observations/Objective: Check In: Case Manager checked in with all participants to review discharge dates, insurance authorizations, work-related documents and needs from the treatment team. Client stated needs and engaged in discussion.   Initial Therapeutic Activity: Elena: Counselor introduced our guest speaker, Einar Grad, [employer] Pharmacist, who shared about psychiatric medications, side effects, treatment considerations and how to communicate with medical professionals. Group Members asked questions and shared medication concerns.   Second Therapeutic Activity: Counselor prompted group members to reference a worksheet called, "Body Scan" to jot down questions and concerns about their physical health in preparation for their upcoming appointments with medical professionals. Client is dealing with fear of having another seizure and the embarrassment that occurs when it happens in public. Client following up with providers for specialized care for chronic conditions. Client looking to start anti depressant medication in near future. Counselor encouraged routine medical check-ups, preparing for appointments, following up with recommendations and seeking specialist if needed.  Check Out: Client prompted group members to share one productivity activity or one self-care practice they can engage in today. Client plans to  get her hair done and go to the gym. Client endorsed safety plan to be followed to prevent safety issues.  Assessment and Plan: Clinician recommends that Client remain in IOP treatment to better manage mental health symptoms, stabilization and to address treatment plan goals. Clinician recommends adherence to crisis/safety plan, taking medications as prescribed, and following up with medical professionals if any issues arise.   Follow Up Instructions: Clinician will send Webex link for next session. The Client was advised to call back or seek an in-person evaluation if the symptoms worsen or if the condition fails to improve as anticipated.     I provided 180 minutes of non-face-to-face time during this encounter.     Lise Auer, LCSW

## 2020-11-10 ENCOUNTER — Other Ambulatory Visit: Payer: Self-pay

## 2020-11-10 ENCOUNTER — Encounter (HOSPITAL_COMMUNITY): Payer: Self-pay | Admitting: Psychiatry

## 2020-11-10 ENCOUNTER — Other Ambulatory Visit (HOSPITAL_COMMUNITY): Payer: Medicare Other | Admitting: Psychiatry

## 2020-11-10 DIAGNOSIS — F329 Major depressive disorder, single episode, unspecified: Secondary | ICD-10-CM | POA: Diagnosis not present

## 2020-11-10 DIAGNOSIS — F331 Major depressive disorder, recurrent, moderate: Secondary | ICD-10-CM

## 2020-11-10 DIAGNOSIS — F4310 Post-traumatic stress disorder, unspecified: Secondary | ICD-10-CM

## 2020-11-10 NOTE — Progress Notes (Signed)
Virtual Visit via Video Note  I connected with Meghan [name] on 11/10/20 at  9:00 AM EST by a video enabled telemedicine application and verified that I am speaking with the correct person using two identifiers.  At orientation to the IOP program, Case Manager discussed the limitations of evaluation and management by telemedicine and the availability of in person appointments. The patient expressed understanding and agreed to proceed with virtual visits throughout the duration of the program.   Location:  Patient: Patient Home Provider: Home Office   History of Present Illness: MDD  Observations/Objective: Check In: Case Manager checked in with all participants to review discharge dates, insurance authorizations, work-related documents and needs from the treatment team. Client stated needs and engaged in discussion.   Initial Therapeutic Activity: Counselor introduced Rolin Barry, [place] Chaplain to present information and discussion on Grief and Loss. Group members engaged in discussion, sharing how grief impacts them, what comforts them, what emotions are felt, labeling losses, etc. After guest speaker logged off, Counselor prompted group to spend 10-15 minutes journaling to process personal grief and loss situations. Counselor processed entries with group and client's identified areas for additional processing in individual therapy. Client noted several significant losses and anniversaries of loss.  Second Therapeutic Activity: Counselor provided group with an exhaustive list of community, state and national mental health, substance abuse and basic need resources. Counselor prompted group members to save numbers in their phones and to bookmark links for future use. Group members shared additional resources they were aware of and have utilized. Client noted they are interested in engaging in volunteering, [employer].  Check Out: Client prompted group  members to share one productivity activity or one self-care practice they can engage in today. Client plans to attend a support group tonight. Client endorsed safety plan to be followed to prevent safety issues.  Assessment and Plan: Clinician recommends that Client remain in IOP treatment to better manage mental health symptoms, stabilization and to address treatment plan goals. Clinician recommends adherence to crisis/safety plan, taking medications as prescribed, and following up with medical professionals if any issues arise.   Follow Up Instructions: Clinician will send Webex link for next session. The Client was advised to call back or seek an in-person evaluation if the symptoms worsen or if the condition fails to improve as anticipated.     I provided 180 minutes of non-face-to-face time during this encounter.     Lise Auer, LCSW

## 2020-11-11 ENCOUNTER — Other Ambulatory Visit (HOSPITAL_COMMUNITY): Payer: Medicare Other

## 2020-11-11 ENCOUNTER — Other Ambulatory Visit: Payer: Self-pay

## 2020-11-11 MED ORDER — SERTRALINE HCL 25 MG PO TABS: 25.0000 mg | ORAL_TABLET | Freq: Every day | ORAL | 0 refills | Status: DC

## 2020-11-11 NOTE — Progress Notes (Unsigned)
Virtual Visit via Telephone Note  I connected with Meghan [name] on 11/11/20 at  9:00 AM EST by telephone and verified that I am speaking with the correct person using two identifiers.  Location: Patient: home Provider: Home office    I discussed the limitations, risks, security and privacy concerns of performing an evaluation and management service by telephone and the availability of in person appointments. I also discussed with the patient that there may be a patient responsible charge related to this service. The patient expressed understanding and agreed to proceed.    I discussed the assessment and treatment plan with the patient. The patient was provided an opportunity to ask questions and all were answered. The patient agreed with the plan and demonstrated an understanding of the instructions.   The patient was advised to call back or seek an in-person evaluation if the symptoms worsen or if the condition fails to improve as anticipated.  I provided 15 minutes of non-face-to-face time during this encounter.   Derrill Center, NP   [facility] MD/PA/NP OP Progress Note  11/11/2020 8:42 AM Meghan [name]  MRN:  [id number]  Evaluation: Meghan [name] was seen and evaluated via WebEx. She presents flat, guarded and depressed. Continues to ruminate about family stressors. States she feels her depression is getting worse and is interested in starting an antidepressant. States she has not taken any antidepressants in the past however had plans to follow-up with psychiatry while in [place] to consider starting medications. Patient's unable to recall name of medication. Discussed initiating Zoloft 25 mg p.o. daily with titration. Patient was receptive to plan. Continues to deny suicidal or homicidal ideations. Denies auditory or visual hallucinations. Rates her depression 8 out of 10 with 10 being the worst. Reports fair appetite. States she is resting well throughout the night. Support, encouragement and reassurance  was provided.  Visit Diagnosis: No diagnosis found.  Past Psychiatric History:   Past Medical History:  Past Medical History:  Diagnosis Date  . Arthritis    left knee  . Asthma   . Atrial fibrillation ([place])   . Atrial fibrillation ([place]) 02/16/2019  . CHF (congestive heart failure) ([place]) 01/2019   Physician from [place] informed her she had this   . Depression   . Dysrhythmia    Atrial fibrillation  . GERD (gastroesophageal reflux disease)   . Hiatal hernia   . History of pulmonary embolus (PE)   . Hypercholesterolemia   . Macular degeneration    of left eye  . Osteoarthritis    knees  . Pneumonia    in the past  . Pre-diabetes   . Pulmonary embolism ([place])    bilat  . Seizure ([place])   . Seizure disorder ([place]) 2017  . Sleep apnea CPAP   [facility]  . Stroke [facility])    1989 and 1995 (left sided weakness)  . Vitamin D deficiency     Past Surgical History:  Procedure Laterality Date  . ABDOMINAL HYSTERECTOMY  2003   complete  . CARDIAC CATHETERIZATION  2017   in [facility] in [place]  . CESAREAN SECTION  9833,8250   x 2  . CESAREAN SECTION     x 2  . CHOLECYSTECTOMY  2003  . COLONOSCOPY WITH PROPOFOL N/A 06/05/2019   Procedure: COLONOSCOPY WITH PROPOFOL;  Surgeon: Carol Ada, MD;  Location: [facility];  Service: Endoscopy;  Laterality: N/A;  . CYST REMOVAL WITH BONE GRAFT Left 10/18/2020   Procedure: BONE GRAFTING OF ENCHONDROMA MIDDLE  [facility] OF LEFT MIDDLE FINGER;  Surgeon: Daryll Brod, MD;  Location: [place];  Service: Orthopedics;  Laterality: Left;  AXILLARY BLOCK  . DIAGNOSTIC LAPAROSCOPY  2015; 2017   lap hernia repair x2  . HERNIA REPAIR  0737, 1062   umbilical hernia repair  . IVC FILTER INSERTION  2003   Hx pulmonary embolus  . POLYPECTOMY  06/05/2019   Procedure: POLYPECTOMY;  Surgeon: Carol Ada, MD;  Location: [facility];  Service: Endoscopy;;  . SHOULDER ARTHROSCOPY W/ ROTATOR CUFF REPAIR  08/28/2017   right shoulder  . WRIST  ARTHROSCOPY WITH DEBRIDEMENT Left 10/18/2020   Procedure: LEFT WRIST ARTHROSCOPY WITH DEBRIDEMENT;  Surgeon: Daryll Brod, MD;  Location: [place];  Service: Orthopedics;  Laterality: Left;  AXILLARY BLOCK    Family Psychiatric History:   Family History:  Family History  Problem Relation Age of Onset  . Breast cancer Mother   . Breast cancer Maternal Grandmother   . Heart attack Father   . Diabetes Father   . Congestive Heart Failure Father     Social History:  Social History   Socioeconomic History  . Marital status: Married    Spouse name: Not on file  . Number of children: 3  . Years of education: Not on file  . Highest education level: Not on file  Occupational History  . Not on file  Tobacco Use  . Smoking status: Never Smoker  . Smokeless tobacco: Never Used  Vaping Use  . Vaping Use: Never used  Substance and Sexual Activity  . Alcohol use: Never  . Drug use: Never  . Sexual activity: Not on file  Other Topics Concern  . Not on file  Social History Narrative   ** Merged History Encounter **       Social Determinants of Health   Financial Resource Strain: Not on file  Food Insecurity: Not on file  Transportation Needs: Not on file  Physical Activity: Not on file  Stress: Not on file  Social Connections: Not on file    Allergies:  Allergies  Allergen Reactions  . Acetaminophen-Codeine Swelling, Rash and Other (See Comments)    Tylenol with Codeine, Tylenol #3,  (facial swelling, hives)  . Other Hives and Other (See Comments)    Muscle relaxer that starts with a "T" caused hives (not tizanidine) January or February 2020- made the patient VERY sick  . Tramadol Hives and Other (See Comments)    Patient stated "I was trippin' and I do not want that ever again"  . Coconut Oil Rash and Other (See Comments)    ANY coconut products   . Meloxicam Rash  . Tomato Rash    Metabolic Disorder Labs: No results found for: HGBA1C, MPG No results found for:  PROLACTIN No results found for: CHOL, TRIG, HDL, CHOLHDL, VLDL, LDLCALC Lab Results  Component Value Date   TSH 2.220 03/25/2020    Therapeutic Level Labs: No results found for: LITHIUM No results found for: VALPROATE No components found for:  CBMZ  Current Medications: Current Outpatient Medications  Medication Sig Dispense Refill  . sertraline (ZOLOFT) 25 MG tablet Take 1 tablet (25 mg total) by mouth daily. 30 tablet 0  . acetaminophen (TYLENOL) 500 MG tablet Take 1,000 mg by mouth every 6 (six) hours as needed for moderate pain.    Marland Kitchen albuterol (VENTOLIN HFA) 108 (90 Base) MCG/ACT inhaler Inhale 2 puffs into the lungs every 6 (six) hours as needed for wheezing or shortness of breath.    Marland Kitchen  BREZTRI AEROSPHERE 160-9-4.8 MCG/ACT AERO Inhale 2 puffs into the lungs in the morning and at bedtime.     . Cholecalciferol (VITAMIN D-3) 25 MCG (1000 UT) CAPS Take 1,000 Units by mouth daily.    . cycloSPORINE (RESTASIS) 0.05 % ophthalmic emulsion Place 1 drop into both eyes 2 (two) times daily.     . flecainide (TAMBOCOR) 50 MG tablet Take 50 mg by mouth 2 (two) times daily.     . furosemide (LASIX) 40 MG tablet Take 40 mg by mouth daily with breakfast.     . levETIRAcetam (KEPPRA) 500 MG tablet Take 1 tablet (500 mg total) by mouth 2 (two) times daily. 60 tablet 0  . metFORMIN (GLUCOPHAGE) 500 MG tablet Take 500 mg by mouth daily with breakfast.    . Multiple Vitamins-Minerals (PRESERVISION AREDS 2 PO) Take 2 capsules by mouth daily with breakfast.     . Omega-3 Fatty Acids (FISH OIL) 1000 MG CAPS Take 1,000 mg by mouth daily with breakfast.     . omeprazole (PRILOSEC) 40 MG capsule Take 40 mg by mouth daily.    Marland Kitchen oxyCODONE-acetaminophen (PERCOCET) 7.5-325 MG tablet Take 1 tablet by mouth every 4 (four) hours as needed for severe pain. 20 tablet 0  . OZEMPIC, 0.25 OR 0.5 MG/DOSE, 2 MG/1.5ML SOPN Inject 0.5 mg into the skin every Sunday.    . promethazine (PHENERGAN) 12.5 MG tablet Take 12.5 mg  by mouth at bedtime.    . rivaroxaban (XARELTO) 20 MG TABS tablet Take 20 mg by mouth at bedtime.     Marland Kitchen tiZANidine (ZANAFLEX) 4 MG tablet Take 4 mg by mouth at bedtime as needed for muscle spasms.      No current facility-administered medications for this visit.     Musculoskeletal: Strength & Muscle Tone: within normal limits Gait & Station: normal Patient leans: N/A  Psychiatric Specialty Exam: Review of Systems  There were no vitals taken for this visit.There is no height or weight on file to calculate BMI.  General Appearance: Casual  Eye Contact:  Good  Speech:  Clear and Coherent  Volume:  Decreased  Mood:  Depressed and Hopeless  Affect:  Congruent  Thought Process:  Coherent  Orientation:  Full (Time, Place, and Person)  Thought Content: Hallucinations: None   Suicidal Thoughts:  No  Homicidal Thoughts:  No  Memory:  Immediate;   Fair Recent;   Fair  Judgement:  Fair  Insight:  Good  Psychomotor Activity:  Normal  Concentration:  Concentration: Fair  Recall:  [place] of Knowledge: Good  Language: Fair  Akathisia:  No  Handed:  Right  AIMS (if indicated):   Assets:  Communication Skills Desire for Improvement Resilience Social Support  ADL's:  Intact  Cognition: WNL  Sleep:  Fair   Screenings: GAD-7   Health and safety inspector from 09/05/2020 in [place]  Total GAD-7 Score 3    PHQ2-9   Flowsheet Row Counselor from 10/27/2020 in [place] Counselor from 09/05/2020 in [place]  PHQ-2 Total Score 4 0  PHQ-9 Total Score 14 -    Flowsheet Row ED from 11/06/2020 in [place] DEPT Counselor from 10/27/2020 in [place] Admission (Discharged) from 10/18/2020 in [place] No Risk No Risk No Risk       Assessment and Plan:  Continue intensive outpatient  programming Initiated Zoloft 25 mg p.o. daily  with titration  Treatment plan was reviewed and agreed upon by NP T. Bobby Rumpf and patient Natally Ribera need for continued group services   Derrill Center, NP 11/11/2020, 8:42 AM

## 2020-11-12 ENCOUNTER — Encounter (HOSPITAL_COMMUNITY): Payer: Self-pay

## 2020-11-14 ENCOUNTER — Inpatient Hospital Stay (HOSPITAL_COMMUNITY)
Admission: EM | Admit: 2020-11-14 | Discharge: 2020-11-17 | DRG: 389 | Disposition: A | Discharge status: Home / Self Care | Payer: Medicare Other | Attending: Family Medicine | Admitting: Family Medicine

## 2020-11-14 ENCOUNTER — Other Ambulatory Visit: Payer: Self-pay

## 2020-11-14 ENCOUNTER — Ambulatory Visit (HOSPITAL_COMMUNITY): Payer: Medicare Other | Admitting: Licensed Clinical Social Worker

## 2020-11-14 ENCOUNTER — Emergency Department (HOSPITAL_COMMUNITY): Payer: Medicare Other

## 2020-11-14 ENCOUNTER — Other Ambulatory Visit (HOSPITAL_COMMUNITY): Payer: Self-pay

## 2020-11-14 ENCOUNTER — Encounter (HOSPITAL_COMMUNITY): Payer: Self-pay | Admitting: Psychiatry

## 2020-11-14 ENCOUNTER — Other Ambulatory Visit (HOSPITAL_COMMUNITY): Payer: Medicare Other | Admitting: Psychiatry

## 2020-11-14 ENCOUNTER — Telehealth (HOSPITAL_COMMUNITY): Payer: Self-pay | Admitting: Psychiatry

## 2020-11-14 ENCOUNTER — Encounter (HOSPITAL_COMMUNITY): Payer: Self-pay | Admitting: Emergency Medicine

## 2020-11-14 DIAGNOSIS — K567 Ileus, unspecified: Secondary | ICD-10-CM

## 2020-11-14 DIAGNOSIS — E119 Type 2 diabetes mellitus without complications: Secondary | ICD-10-CM | POA: Diagnosis present

## 2020-11-14 DIAGNOSIS — I480 Paroxysmal atrial fibrillation: Secondary | ICD-10-CM | POA: Diagnosis present

## 2020-11-14 DIAGNOSIS — Z833 Family history of diabetes mellitus: Secondary | ICD-10-CM

## 2020-11-14 DIAGNOSIS — F329 Major depressive disorder, single episode, unspecified: Secondary | ICD-10-CM | POA: Diagnosis not present

## 2020-11-14 DIAGNOSIS — Z90710 Acquired absence of both cervix and uterus: Secondary | ICD-10-CM

## 2020-11-14 DIAGNOSIS — W19XXXA Unspecified fall, initial encounter: Secondary | ICD-10-CM

## 2020-11-14 DIAGNOSIS — Z8673 Personal history of transient ischemic attack (TIA), and cerebral infarction without residual deficits: Secondary | ICD-10-CM

## 2020-11-14 DIAGNOSIS — G40909 Epilepsy, unspecified, not intractable, without status epilepticus: Secondary | ICD-10-CM | POA: Diagnosis present

## 2020-11-14 DIAGNOSIS — E861 Hypovolemia: Secondary | ICD-10-CM | POA: Diagnosis present

## 2020-11-14 DIAGNOSIS — I4892 Unspecified atrial flutter: Secondary | ICD-10-CM | POA: Diagnosis present

## 2020-11-14 DIAGNOSIS — R1115 Cyclical vomiting syndrome unrelated to migraine: Secondary | ICD-10-CM

## 2020-11-14 DIAGNOSIS — E559 Vitamin D deficiency, unspecified: Secondary | ICD-10-CM | POA: Diagnosis present

## 2020-11-14 DIAGNOSIS — Z7901 Long term (current) use of anticoagulants: Secondary | ICD-10-CM

## 2020-11-14 DIAGNOSIS — E860 Dehydration: Secondary | ICD-10-CM | POA: Diagnosis present

## 2020-11-14 DIAGNOSIS — Z9049 Acquired absence of other specified parts of digestive tract: Secondary | ICD-10-CM

## 2020-11-14 DIAGNOSIS — R079 Chest pain, unspecified: Secondary | ICD-10-CM

## 2020-11-14 DIAGNOSIS — G4730 Sleep apnea, unspecified: Secondary | ICD-10-CM | POA: Diagnosis present

## 2020-11-14 DIAGNOSIS — K5641 Fecal impaction: Secondary | ICD-10-CM | POA: Diagnosis present

## 2020-11-14 DIAGNOSIS — F411 Generalized anxiety disorder: Secondary | ICD-10-CM | POA: Diagnosis not present

## 2020-11-14 DIAGNOSIS — R55 Syncope and collapse: Secondary | ICD-10-CM

## 2020-11-14 DIAGNOSIS — Z86711 Personal history of pulmonary embolism: Secondary | ICD-10-CM

## 2020-11-14 DIAGNOSIS — I5032 Chronic diastolic (congestive) heart failure: Secondary | ICD-10-CM | POA: Diagnosis present

## 2020-11-14 DIAGNOSIS — E7800 Pure hypercholesterolemia, unspecified: Secondary | ICD-10-CM | POA: Diagnosis present

## 2020-11-14 DIAGNOSIS — J45909 Unspecified asthma, uncomplicated: Secondary | ICD-10-CM | POA: Diagnosis present

## 2020-11-14 DIAGNOSIS — Z86718 Personal history of other venous thrombosis and embolism: Secondary | ICD-10-CM

## 2020-11-14 DIAGNOSIS — Z20822 Contact with and (suspected) exposure to covid-19: Secondary | ICD-10-CM | POA: Diagnosis present

## 2020-11-14 DIAGNOSIS — R112 Nausea with vomiting, unspecified: Secondary | ICD-10-CM | POA: Diagnosis present

## 2020-11-14 DIAGNOSIS — K219 Gastro-esophageal reflux disease without esophagitis: Secondary | ICD-10-CM | POA: Diagnosis present

## 2020-11-14 DIAGNOSIS — F331 Major depressive disorder, recurrent, moderate: Secondary | ICD-10-CM

## 2020-11-14 DIAGNOSIS — Z7984 Long term (current) use of oral hypoglycemic drugs: Secondary | ICD-10-CM

## 2020-11-14 DIAGNOSIS — R1110 Vomiting, unspecified: Secondary | ICD-10-CM

## 2020-11-14 DIAGNOSIS — Z79899 Other long term (current) drug therapy: Secondary | ICD-10-CM

## 2020-11-14 DIAGNOSIS — Z6841 Body Mass Index (BMI) 40.0 and over, adult: Secondary | ICD-10-CM

## 2020-11-14 LAB — CBC WITH DIFFERENTIAL/PLATELET
Abs Immature Granulocytes: 0.03 10*3/uL (ref 0.00–0.07)
Basophils Absolute: 0 10*3/uL (ref 0.0–0.1)
Basophils Relative: 0 %
Eosinophils Absolute: 0.1 10*3/uL (ref 0.0–0.5)
Eosinophils Relative: 1 %
HCT: 39.8 % (ref 36.0–46.0)
Hemoglobin: 12.2 g/dL (ref 12.0–15.0)
Immature Granulocytes: 0 %
Lymphocytes Relative: 31 %
Lymphs Abs: 2.5 10*3/uL (ref 0.7–4.0)
MCH: 25.7 pg — ABNORMAL LOW (ref 26.0–34.0)
MCHC: 30.7 g/dL (ref 30.0–36.0)
MCV: 83.8 fL (ref 80.0–100.0)
Monocytes Absolute: 0.7 10*3/uL (ref 0.1–1.0)
Monocytes Relative: 9 %
Neutro Abs: 4.7 10*3/uL (ref 1.7–7.7)
Neutrophils Relative %: 59 %
Platelets: 310 10*3/uL (ref 150–400)
RBC: 4.75 MIL/uL (ref 3.87–5.11)
RDW: 14.8 % (ref 11.5–15.5)
WBC: 8.1 10*3/uL (ref 4.0–10.5)
nRBC: 0 % (ref 0.0–0.2)

## 2020-11-14 LAB — ACETAMINOPHEN LEVEL: Acetaminophen (Tylenol), Serum: <10 ug/mL — ABNORMAL LOW (ref 10–30)

## 2020-11-14 LAB — COMPREHENSIVE METABOLIC PANEL
ALT: 9 U/L (ref 0–44)
AST: 21 U/L (ref 15–41)
Albumin: 3.2 g/dL — ABNORMAL LOW (ref 3.5–5.0)
Alkaline Phosphatase: 178 U/L — ABNORMAL HIGH (ref 38–126)
Anion gap: 11 (ref 5–15)
BUN: 18 mg/dL (ref 6–20)
CO2: 24 mmol/L (ref 22–32)
Calcium: 9.9 mg/dL (ref 8.9–10.3)
Chloride: 105 mmol/L (ref 98–111)
Creatinine, Ser: 0.89 mg/dL (ref 0.44–1.00)
GFR, Estimated: >60 mL/min (ref >60)
Glucose, Bld: 92 mg/dL (ref 70–99)
Potassium: 4.4 mmol/L (ref 3.5–5.1)
Sodium: 140 mmol/L (ref 135–145)
Total Bilirubin: 0.9 mg/dL (ref 0.3–1.2)
Total Protein: 7.1 g/dL (ref 6.5–8.1)

## 2020-11-14 LAB — MAGNESIUM: Magnesium: 2.2 mg/dL (ref 1.7–2.4)

## 2020-11-14 LAB — ETHANOL: Alcohol, Ethyl (B): <10 mg/dL (ref <10)

## 2020-11-14 LAB — TROPONIN I (HIGH SENSITIVITY)
Troponin I (High Sensitivity): 3 ng/L (ref <18)
Troponin I (High Sensitivity): 5 ng/L (ref <18)

## 2020-11-14 LAB — RESP PANEL BY RT-PCR (FLU A&B, COVID) ARPGX2
Influenza A by PCR: NEGATIVE
Influenza B by PCR: NEGATIVE
SARS Coronavirus 2 by RT PCR: NEGATIVE

## 2020-11-14 LAB — SALICYLATE LEVEL: Salicylate Lvl: <7 mg/dL — ABNORMAL LOW (ref 7.0–30.0)

## 2020-11-14 MED ORDER — CYCLOSPORINE 0.05 % OP EMUL
1.0000 [drp] | Freq: Two times a day (BID) | OPHTHALMIC | Status: DC
  Administered 2020-11-15 – 2020-11-17 (×6): 1 [drp] via OPHTHALMIC
  Filled 2020-11-14 (×7): qty 1

## 2020-11-14 MED ORDER — RIVAROXABAN 20 MG PO TABS
20.0000 mg | ORAL_TABLET | Freq: Every day | ORAL | Status: DC
  Administered 2020-11-14 – 2020-11-16 (×3): 20 mg via ORAL
  Filled 2020-11-14 (×3): qty 1

## 2020-11-14 MED ORDER — LEVETIRACETAM 500 MG PO TABS
500.0000 mg | ORAL_TABLET | Freq: Two times a day (BID) | ORAL | Status: DC
  Administered 2020-11-14 – 2020-11-17 (×6): 500 mg via ORAL
  Filled 2020-11-14 (×6): qty 1

## 2020-11-14 MED ORDER — BUDESON-GLYCOPYRROL-FORMOTEROL 160-9-4.8 MCG/ACT IN AERO: 2.0000 | INHALATION_SPRAY | Freq: Two times a day (BID) | RESPIRATORY_TRACT | Status: DC

## 2020-11-14 MED ORDER — UMECLIDINIUM BROMIDE 62.5 MCG/INH IN AEPB
1.0000 | INHALATION_SPRAY | Freq: Every day | RESPIRATORY_TRACT | Status: DC
  Administered 2020-11-15 – 2020-11-17 (×3): 1 via RESPIRATORY_TRACT
  Filled 2020-11-14: qty 7

## 2020-11-14 MED ORDER — ACETAMINOPHEN 500 MG PO TABS
1000.0000 mg | ORAL_TABLET | Freq: Four times a day (QID) | ORAL | Status: DC | PRN
  Administered 2020-11-15 – 2020-11-17 (×3): 1000 mg via ORAL
  Filled 2020-11-14 (×3): qty 2

## 2020-11-14 MED ORDER — ONDANSETRON HCL 4 MG/2ML IJ SOLN
4.0000 mg | Freq: Once | INTRAMUSCULAR | Status: AC
  Administered 2020-11-14: 4 mg via INTRAVENOUS
  Filled 2020-11-14: qty 2

## 2020-11-14 MED ORDER — LACTULOSE 10 GM/15ML PO SOLN
30.0000 g | ORAL | Status: AC
  Administered 2020-11-14 – 2020-11-15 (×2): 30 g via ORAL
  Filled 2020-11-14: qty 60
  Filled 2020-11-14: qty 45
  Filled 2020-11-14: qty 60
  Filled 2020-11-14: qty 45

## 2020-11-14 MED ORDER — ONDANSETRON HCL 4 MG/2ML IJ SOLN
4.0000 mg | Freq: Four times a day (QID) | INTRAMUSCULAR | Status: DC | PRN
  Administered 2020-11-15: 4 mg via INTRAVENOUS
  Filled 2020-11-14: qty 2

## 2020-11-14 MED ORDER — PANTOPRAZOLE SODIUM 40 MG PO TBEC
40.0000 mg | DELAYED_RELEASE_TABLET | Freq: Every day | ORAL | Status: DC
  Administered 2020-11-15 – 2020-11-17 (×3): 40 mg via ORAL
  Filled 2020-11-14 (×3): qty 1

## 2020-11-14 MED ORDER — FLECAINIDE ACETATE 50 MG PO TABS
50.0000 mg | ORAL_TABLET | Freq: Two times a day (BID) | ORAL | Status: DC
  Administered 2020-11-14 – 2020-11-17 (×6): 50 mg via ORAL
  Filled 2020-11-14 (×7): qty 1

## 2020-11-14 MED ORDER — METOCLOPRAMIDE HCL 5 MG/ML IJ SOLN
10.0000 mg | Freq: Once | INTRAMUSCULAR | Status: AC
  Administered 2020-11-14: 10 mg via INTRAVENOUS
  Filled 2020-11-14: qty 2

## 2020-11-14 MED ORDER — TIZANIDINE HCL 4 MG PO TABS
4.0000 mg | ORAL_TABLET | Freq: Every evening | ORAL | Status: DC | PRN
  Administered 2020-11-16: 4 mg via ORAL
  Filled 2020-11-14: qty 1

## 2020-11-14 MED ORDER — SODIUM CHLORIDE 0.9 % IV SOLN: INTRAVENOUS | Status: AC

## 2020-11-14 MED ORDER — FLUTICASONE FUROATE-VILANTEROL 200-25 MCG/INH IN AEPB
1.0000 | INHALATION_SPRAY | Freq: Every day | RESPIRATORY_TRACT | Status: DC
  Administered 2020-11-15 – 2020-11-17 (×3): 1 via RESPIRATORY_TRACT
  Filled 2020-11-14: qty 28

## 2020-11-14 MED ORDER — ALBUTEROL SULFATE HFA 108 (90 BASE) MCG/ACT IN AERS
2.0000 | INHALATION_SPRAY | RESPIRATORY_TRACT | Status: DC | PRN
  Administered 2020-11-17: 2 via RESPIRATORY_TRACT
  Filled 2020-11-14: qty 6.7

## 2020-11-14 MED ORDER — PROMETHAZINE HCL 25 MG PO TABS
12.5000 mg | ORAL_TABLET | Freq: Every day | ORAL | Status: DC
  Administered 2020-11-14 – 2020-11-16 (×3): 12.5 mg via ORAL
  Filled 2020-11-14 (×3): qty 1

## 2020-11-14 MED ORDER — METFORMIN HCL 500 MG PO TABS
500.0000 mg | ORAL_TABLET | Freq: Every day | ORAL | Status: DC
  Administered 2020-11-15: 500 mg via ORAL
  Filled 2020-11-14: qty 1

## 2020-11-14 MED ORDER — SODIUM CHLORIDE 0.9 % IV BOLUS
500.0000 mL | Freq: Once | INTRAVENOUS | Status: AC
  Administered 2020-11-14: 500 mL via INTRAVENOUS

## 2020-11-14 MED ORDER — RIVAROXABAN 20 MG PO TABS
20.0000 mg | ORAL_TABLET | Freq: Every day | ORAL | Status: DC
  Filled 2020-11-14: qty 1

## 2020-11-14 NOTE — Progress Notes (Signed)
Virtual Visit via Video Note  I connected with [name] on 11/14/20 at  9:00 AM EST by a video enabled telemedicine application and verified that I am speaking with the correct person using two identifiers.  At orientation to the IOP program, Case Manager discussed the limitations of evaluation and management by telemedicine and the availability of in person appointments. The patient expressed understanding and agreed to proceed with virtual visits throughout the duration of the program.   Location:  Patient: Patient Home Provider: Home Office   History of Present Illness: MDD  Observations/Objective: Check In: Case Manager checked in with all participants to review discharge dates, insurance authorizations, work-related documents and needs from the treatment team. Client stated needs and engaged in discussion.   Initial Therapeutic Activity: Counselor facilitated therapeutic processing with group members to assess mood and current functioning, prompting group members to share about application of skills, progress and challenges in treatment/personal lives. Client reports that she has been experiencing nausea, vomiting, dizziness and fatigue. Counselor concerned that Client was dehydrated, as she was faint and not as she normally presents. Counselor paused group to reach out to Case Manager to call 911 to have Client assessed and treated for medical needs. Emergency services were informed. Client remained in camera on group until medics arrived.  Client presents with mild depression and moderate anxiety. Client denied any current SI/HI/psychosis.  Assessment and Plan: Clinician recommends that Client remain in IOP treatment to better manage mental health symptoms, stabilization and to address treatment plan goals. Clinician recommends adherence to crisis/safety plan, taking medications as prescribed, and following up with medical professionals if any issues arise.   Follow Up  Instructions: Clinician will send Webex link for next session. The Client was advised to call back or seek an in-person evaluation if the symptoms worsen or if the condition fails to improve as anticipated.     I provided 120 minutes of non-face-to-face time during this encounter.     Lise Auer, LCSW

## 2020-11-14 NOTE — ED Notes (Signed)
Soap Sud Enema ordered @ [employer], Therapist, sports ordered by [facility]

## 2020-11-14 NOTE — ED Notes (Signed)
Pt taken to xray 

## 2020-11-14 NOTE — ED Notes (Signed)
Pt states she fell twice just before EMS arrived- pt was getting up to vomit and felt dizzy and fell. Pt denies hitting head. Pt denies losing consciousness.

## 2020-11-14 NOTE — ED Triage Notes (Signed)
Pt arrives via EMS for N/V started 2 days ago. Denies abd pain. Generalized weakness for two weeks ongoing since she left the hospital. Pt complains of cp, left arm pain. BP 106/62- after 459mL NS from EMS. HR 100, resp 24, 99% on room air. CBG 120. EMS gave 4mg  zofran.

## 2020-11-14 NOTE — H&P (Addendum)
History and Physical    Meghan [name]:[id number] DOB: 1966/07/23 DOA: 11/14/2020  PCP: Meghan Fiedler, FNP (Confirm with patient/family/NH records and if not entered, this has to be entered at [facility] point of entry) Patient coming from: Home  I have personally briefly reviewed patient's old medical records in [place]  Chief Complaint: N/V  HPI: Meghan [name] is a 55 y.o. female with medical history significant of PAF on Xarelto and flecainide, chronic diastolic CHF, seizure disorder, morbid obesity, IIDM, presented with constipation, and nauseous vomit.  Patient has no history of constipation but last week she developed constipation her last bowel movement was 5 days ago.  Started from 2 days ago, she started to feel nausea and vomited multiple times each time she tried to eat or drink something.  Vomitus mostly stomach content no blood no bile, no foul smell.  She did stop to passing gas about 2 days ago.  She feels "annoying feeling" on right aspect of abdomen, but denies any pain.  She was just restarted Keppra the day before yesterday, but she does still feel her abdomen problem of nausea vomiting and constipation related to Keppra.  Denies any fever chills, no chest pain no shortness of breath, denies any illicit drug use.  ED Course: Work liver function, bilirubin within normal limits, potassium 4.4. 9.  X-ray showed no obstruction but large amount of stool in colon  Review of Systems: As per HPI otherwise 14 point review of systems negative.   Past Medical History:  Diagnosis Date  . Arthritis    left knee  . Asthma   . Atrial fibrillation ([place])   . Atrial fibrillation ([place]) 02/16/2019  . CHF (congestive heart failure) ([place]) 01/2019   Physician from [place] informed her she had this   . Depression   . Dysrhythmia    Atrial fibrillation  . GERD (gastroesophageal reflux disease)   . Hiatal hernia   . History of pulmonary embolus (PE)   . Hypercholesterolemia   .  Macular degeneration    of left eye  . Osteoarthritis    knees  . Pneumonia    in the past  . Pre-diabetes   . Pulmonary embolism ([place])    bilat  . Seizure ([place])   . Seizure disorder ([place]) 2017  . Sleep apnea CPAP   [facility]  . Stroke [facility])    1989 and 1995 (left sided weakness)  . Vitamin D deficiency     Past Surgical History:  Procedure Laterality Date  . ABDOMINAL HYSTERECTOMY  2003   complete  . CARDIAC CATHETERIZATION  2017   in [facility] in [place]  . CESAREAN SECTION  [id number]   x 2  . CESAREAN SECTION     x 2  . CHOLECYSTECTOMY  2003  . COLONOSCOPY WITH PROPOFOL N/A 06/05/2019   Procedure: COLONOSCOPY WITH PROPOFOL;  Surgeon: Carol Ada, MD;  Location: [facility];  Service: Endoscopy;  Laterality: N/A;  . CYST REMOVAL WITH BONE GRAFT Left 10/18/2020   Procedure: BONE GRAFTING OF ENCHONDROMA MIDDLE [place] OF LEFT MIDDLE FINGER;  Surgeon: Daryll Brod, MD;  Location: [place];  Service: Orthopedics;  Laterality: Left;  AXILLARY BLOCK  . DIAGNOSTIC LAPAROSCOPY  2015; 2017   lap hernia repair x2  . HERNIA REPAIR  7517, 0017   umbilical hernia repair  . IVC FILTER INSERTION  2003   Hx pulmonary embolus  . POLYPECTOMY  06/05/2019   Procedure: POLYPECTOMY;  Surgeon: Carol Ada, MD;  Location: [facility];  Service: Endoscopy;;  . SHOULDER ARTHROSCOPY W/ ROTATOR CUFF REPAIR  08/28/2017   right shoulder  . WRIST ARTHROSCOPY WITH DEBRIDEMENT Left 10/18/2020   Procedure: LEFT WRIST ARTHROSCOPY WITH DEBRIDEMENT;  Surgeon: Daryll Brod, MD;  Location: [place];  Service: Orthopedics;  Laterality: Left;  AXILLARY BLOCK     reports that she has never smoked. She has never used smokeless tobacco. She reports that she does not drink alcohol and does not use drugs.  Allergies  Allergen Reactions  . Acetaminophen-Codeine Swelling, Rash and Other (See Comments)    Tylenol with Codeine, Tylenol #3,  (facial swelling, hives)  . Other Hives and Other (See Comments)     Muscle relaxer that starts with a "T" caused hives (not tizanidine) January or February 2020- made the patient VERY sick  . Tramadol Hives and Other (See Comments)    Patient stated "I was trippin' and I do not want that ever again"  . Coconut Oil Rash and Other (See Comments)    ANY coconut products   . Meloxicam Rash  . Tomato Rash    Family History  Problem Relation Age of Onset  . Breast cancer Mother   . Breast cancer Maternal Grandmother   . Heart attack Father   . Diabetes Father   . Congestive Heart Failure Father      Prior to Admission medications   Medication Sig Start Date End Date Taking? Authorizing Provider  acetaminophen (TYLENOL) 500 MG tablet Take 1,000 mg by mouth every 6 (six) hours as needed for moderate pain.   Yes [provider]  albuterol (VENTOLIN HFA) 108 (90 Base) MCG/ACT inhaler Inhale 2 puffs into the lungs as needed for wheezing or shortness of breath.   Yes [provider]  BREZTRI AEROSPHERE 160-9-4.8 MCG/ACT AERO Inhale 2 puffs into the lungs in the morning and at bedtime.  03/24/20  Yes [provider]  Cholecalciferol (VITAMIN D-3) 25 MCG (1000 UT) CAPS Take 1,000 Units by mouth daily.   Yes [provider]  cycloSPORINE (RESTASIS) 0.05 % ophthalmic emulsion Place 1 drop into both eyes 2 (two) times daily.    Yes [provider]  flecainide (TAMBOCOR) 50 MG tablet Take 50 mg by mouth 2 (two) times daily.  09/04/19  Yes [provider]  furosemide (LASIX) 40 MG tablet Take 40 mg by mouth daily with breakfast.    Yes [provider]  levETIRAcetam (KEPPRA) 500 MG tablet Take 1 tablet (500 mg total) by mouth 2 (two) times daily. 11/06/20  Yes Milton Ferguson, MD  metFORMIN (GLUCOPHAGE) 500 MG tablet Take 500 mg by mouth daily with breakfast.   Yes [provider]  Multiple Vitamins-Minerals (PRESERVISION AREDS 2 PO) Take 2 capsules by mouth daily with breakfast.    Yes [provider]  Omega-3 Fatty Acids (FISH OIL) 1000 MG CAPS Take 1,000 mg by mouth daily with breakfast.    Yes [provider]  omeprazole (PRILOSEC) 40 MG capsule Take 40 mg by mouth daily. 08/17/20  Yes [provider]  OZEMPIC, 1 MG/DOSE, 4 MG/3ML SOPN Inject 1 mg into the skin once a week. Sundays 10/24/20  Yes [provider]  promethazine (PHENERGAN) 12.5 MG tablet Take 12.5 mg by mouth at bedtime. 09/28/20  Yes [provider]  rivaroxaban (XARELTO) 20 MG TABS tablet Take 20 mg by mouth at bedtime.    Yes [provider]  tiZANidine (ZANAFLEX) 4 MG tablet Take 4 mg by  mouth at bedtime as needed for muscle spasms.    Yes [provider]  oxyCODONE-acetaminophen (PERCOCET) 7.5-325 MG tablet Take 1 tablet by mouth every 4 (four) hours as needed for severe pain. Patient not taking: Reported on 11/14/2020 10/18/20 10/18/21  Daryll Brod, MD  sertraline (ZOLOFT) 25 MG tablet Take 1 tablet (25 mg total) by mouth daily. Patient not taking: No sig reported 11/11/20 11/11/21  Derrill Center, NP    Physical Exam: Vitals:   11/14/20 1730 11/14/20 1745 11/14/20 1800 11/14/20 1815  BP: 103/75 110/64 105/73 105/66  Pulse: 86 80 74 81  Resp: (!) 26 16 18  (!) 21  Temp:      TempSrc:      SpO2: 94% 94% 94% 96%  Weight:      Height:        Constitutional: NAD, calm, comfortable Vitals:   11/14/20 1730 11/14/20 1745 11/14/20 1800 11/14/20 1815  BP: 103/75 110/64 105/73 105/66  Pulse: 86 80 74 81  Resp: (!) 26 16 18  (!) 21  Temp:      TempSrc:      SpO2: 94% 94% 94% 96%  Weight:      Height:       Eyes: PERRL, lids and conjunctivae normal ENMT: Mucous membranes are dry. Posterior pharynx clear of any exudate or lesions.Normal dentition.  Neck: normal, supple, no masses, no thyromegaly Respiratory: clear to auscultation bilaterally, no wheezing, no crackles. Normal respiratory effort. No accessory muscle use.  Cardiovascular: Regular rate and  rhythm, no murmurs / rubs / gallops. No extremity edema. 2+ pedal pulses. No carotid bruits.  Abdomen: mild tenderness on right aspect of abd, no rebound, no guarding, no masses palpated. No hepatosplenomegaly. Bowel sounds positive on all 4 quardrents.  Musculoskeletal: no clubbing / cyanosis. No joint deformity upper and lower extremities. Good ROM, no contractures. Normal muscle tone.  Skin: no rashes, lesions, ulcers. No induration Neurologic: CN 2-12 grossly intact. Sensation intact, DTR normal. Strength 5/5 in all 4.  Psychiatric: Normal judgment and insight. Alert and oriented x 3. Normal mood.     Labs on Admission: I have personally reviewed following labs and imaging studies  CBC: Recent Labs  Lab 11/14/20 1334  WBC 8.1  NEUTROABS 4.7  HGB 12.2  HCT 39.8  MCV 83.8  PLT 025   Basic Metabolic Panel: Recent Labs  Lab 11/14/20 1334  NA 140  K 4.4  CL 105  CO2 24  GLUCOSE 92  BUN 18  CREATININE 0.89  CALCIUM 9.9  MG 2.2   GFR: Estimated Creatinine Clearance: 100.6 mL/min (by C-G formula based on SCr of 0.89 mg/dL). Liver Function Tests: Recent Labs  Lab 11/14/20 1334  AST 21  ALT 9  ALKPHOS 178*  BILITOT 0.9  PROT 7.1  ALBUMIN 3.2*   No results for input(s): LIPASE, AMYLASE in the last 168 hours. No results for input(s): AMMONIA in the last 168 hours. Coagulation Profile: No results for input(s): INR, PROTIME in the last 168 hours. Cardiac Enzymes: No results for input(s): CKTOTAL, CKMB, CKMBINDEX, TROPONINI in the last 168 hours. BNP (last 3 results) No results for input(s): PROBNP in the last 8760 hours. HbA1C: No results for input(s): HGBA1C in the last 72 hours. CBG: No results for input(s): GLUCAP in the last 168 hours. Lipid Profile: No results for input(s): CHOL, HDL, LDLCALC, TRIG, CHOLHDL, LDLDIRECT in the last 72 hours. Thyroid Function Tests: No results for input(s): TSH, T4TOTAL, FREET4, T3FREE, THYROIDAB in the last  72 hours. Anemia  Panel: No results for input(s): VITAMINB12, FOLATE, FERRITIN, TIBC, IRON, RETICCTPCT in the last 72 hours. Urine analysis:    Component Value Date/Time   COLORURINE COLORLESS (A) 09/29/2020 1228   APPEARANCEUR CLEAR 09/29/2020 1228   LABSPEC 1.006 09/29/2020 1228   PHURINE 5.0 09/29/2020 1228   GLUCOSEU NEGATIVE 09/29/2020 1228   HGBUR NEGATIVE 09/29/2020 1228   BILIRUBINUR NEGATIVE 09/29/2020 1228   KETONESUR NEGATIVE 09/29/2020 1228   PROTEINUR NEGATIVE 09/29/2020 1228   NITRITE NEGATIVE 09/29/2020 1228   LEUKOCYTESUR NEGATIVE 09/29/2020 1228    Radiological Exams on Admission: DG Wrist Complete Right  Result Date: 11/14/2020 CLINICAL DATA:  Fall. EXAM: RIGHT WRIST - COMPLETE 3+ VIEW COMPARISON:  07/20/2020 FINDINGS: There is no evidence of fracture or dislocation. There is no evidence of arthropathy or other focal bone abnormality. Soft tissues are unremarkable. IMPRESSION: Negative. Electronically Signed   By: Franchot Gallo M.D.   On: 11/14/2020 14:45   DG Chest Portable 1 View  Result Date: 11/14/2020 CLINICAL DATA:  Fall, chest pain EXAM: PORTABLE CHEST 1 VIEW COMPARISON:  09/29/2020 FINDINGS: Stable mild cardiomegaly. Both lungs are clear. No pleural effusion or pneumothorax. The visualized skeletal structures are unremarkable. IMPRESSION: No acute process in the chest. Electronically Signed   By: Macy Mis M.D.   On: 11/14/2020 14:46   DG Shoulder Left Port  Result Date: 11/14/2020 CLINICAL DATA:  Fall EXAM: LEFT SHOULDER COMPARISON:  None. FINDINGS: Normal alignment no fracture. Narrowing of the acromial humeral distance consistent with rotator cuff impingement. Mild degenerative change AC joint. IMPRESSION: Negative for fracture Electronically Signed   By: Franchot Gallo M.D.   On: 11/14/2020 14:44    EKG: Independently reviewed.  Rate controlled a flutter with 2:1 transduction  Assessment/Plan Active Problems:   * No active hospital problems. *  (please populate  well all problems here in Problem List. (For example, if patient is on BP meds at home and you resume or decide to hold them, it is a problem that needs to be her. Same for CAD, COPD, HLD and so on)  Fecal impact -From prolonged constipation, no signs of SBO. -Trial of clear liquid diet -Soap and suds enema x1 and Lactulose Q4Hx3, then re-evaluate -UDS last month negative for marijuana  Paroxysmal a flutter -In rate controlled a flutter -Continue flecainide and Xarelto  Dehydration/hypovolemia -From repeated vomiting, hold Lasix -IVF x12 hours and reevaluate  Chronic diastolic CHF -Mild dehydration, hold Lasix tonight -Continue BP meds  Asthma -No acute concern  IIDM -Continue Metformin  Seizure disorder -Continue Keppra  DVT prophylaxis: Xarelto Code Status: Full Code Family Communication: None at bedside Disposition Plan: Expect less than 2 midnight hospital stay Consults called: None Admission status: [facility] Obs   Lequita Halt MD [facility] Pager [phone]  11/14/2020, 6:47 PM

## 2020-11-14 NOTE — Telephone Encounter (Signed)
D:  Pt is requesting that 9-1-1 be called d/t her not being able to get out of bed d/t illness (ie. Dehydration, weakness, and inability to keep food down).  A:  Placed call to 9-1-1 at 1126.  The dispatcher is sending an ambulance out to the patient's home.  Informed Beather Arbour, RN (clinical mgr).

## 2020-11-14 NOTE — ED Notes (Signed)
Pt had a sip of gingerale and began vomiting. Notified MD

## 2020-11-14 NOTE — ED Notes (Signed)
Gave pt gingerale- will reassess to see if she is able to keep it down.

## 2020-11-14 NOTE — ED Provider Notes (Addendum)
[facility] EMERGENCY DEPARTMENT Provider Note   CSN: [id number] Arrival date & time: 11/14/20  1233     History Chief Complaint  Patient presents with  . Emesis  . Chest Pain  . Weakness    CHERALYN OLIVER is a 55 y.o. female.  Patient presents with recurrent nausea vomiting and dizziness Saturday.  She felt so dizzy lightheaded discussed with a fall and injured her left shoulder and right wrist pain with motion.  No head injury.  Patient also had fairly constant chest pain since Saturday anterior nonradiating, left shoulder pain from the direct trauma.  Patient received 150 cc of normal saline from EMS.  Patient felt generally weak for a few weeks.  Patient has had depression recently without suicide ideation or plan.  Patient has history of blood clots, has a filter and is on Xarelto.  Patient recently started a new Keppra for seizures.  Patient denies active bleeding.  Patient's had recurrent nausea and vomiting all weekend.        Past Medical History:  Diagnosis Date  . Arthritis    left knee  . Asthma   . Atrial fibrillation ([place])   . Atrial fibrillation ([place]) 02/16/2019  . CHF (congestive heart failure) ([place]) 01/2019   Physician from [place] informed her she had this   . Depression   . Dysrhythmia    Atrial fibrillation  . GERD (gastroesophageal reflux disease)   . Hiatal hernia   . History of pulmonary embolus (PE)   . Hypercholesterolemia   . Macular degeneration    of left eye  . Osteoarthritis    knees  . Pneumonia    in the past  . Pre-diabetes   . Pulmonary embolism ([place])    bilat  . Seizure ([place])   . Seizure disorder ([place]) 2017  . Sleep apnea CPAP   [facility]  . Stroke [facility])    1989 and 1995 (left sided weakness)  . Vitamin D deficiency     Patient Active Problem List   Diagnosis Date Noted  . Left-sided weakness 09/29/2020  . Asthma 09/29/2020  . Pre-diabetes 09/29/2020  . Pain in left finger(s) 07/12/2020  . Seizure-like  activity ([place]) 03/25/2020  . History of head injury 03/25/2020  . Obstructive sleep apnea syndrome 09/04/2019  . Acute medial meniscus tear of right knee 07/10/2019  . Class 3 severe obesity due to excess calories with serious comorbidity and body mass index (BMI) of 50.0 to 59.9 in adult ([place]) 06/01/2019  . Atrial fibrillation ([place]) 04/17/2019  . History of pulmonary embolus (PE) 04/17/2019  . Type 2 diabetes mellitus, without long-term current use of insulin ([place]) 04/17/2019    Past Surgical History:  Procedure Laterality Date  . ABDOMINAL HYSTERECTOMY  2003   complete  . CARDIAC CATHETERIZATION  2017   in [facility] in [place]  . CESAREAN SECTION  2956,2130   x 2  . CESAREAN SECTION     x 2  . CHOLECYSTECTOMY  2003  . COLONOSCOPY WITH PROPOFOL N/A 06/05/2019   Procedure: COLONOSCOPY WITH PROPOFOL;  Surgeon: Carol Ada, MD;  Location: [facility] ENDOSCOPY;  Service: Endoscopy;  Laterality: N/A;  . CYST REMOVAL WITH BONE GRAFT Left 10/18/2020   Procedure: BONE GRAFTING OF ENCHONDROMA MIDDLE [place] OF LEFT MIDDLE FINGER;  Surgeon: Daryll Brod, MD;  Location: [place];  Service: Orthopedics;  Laterality: Left;  AXILLARY BLOCK  . DIAGNOSTIC LAPAROSCOPY  2015; 2017   lap hernia repair x2  . HERNIA  REPAIR  1610, 9604   umbilical hernia repair  . IVC FILTER INSERTION  2003   Hx pulmonary embolus  . POLYPECTOMY  06/05/2019   Procedure: POLYPECTOMY;  Surgeon: Carol Ada, MD;  Location: [facility] ENDOS[facility];  Service: Endoscopy;;  . SHOULDER ARTHROSCOPY W/ ROTATOR CUFF REPAIR  08/28/2017   right shoulder  . WRIST ARTHROSCOPY WITH DEBRIDEMENT Left 10/18/2020   Procedure: LEFT WRIST ARTHROSCOPY WITH DEBRIDEMENT;  Surgeon: Daryll Brod, MD;  Location: [facility];  Service: Orthopedics;  Laterality: Left;  AXILLARY BLOCK     OB History   No obstetric history on file.     Family History  Problem Relation Age of Onset  . Breast cancer Mother   . Breast cancer Maternal Grandmother   . Heart  attack Father   . Diabetes Father   . Congestive Heart Failure Father     Social History   Tobacco Use  . Smoking status: Never Smoker  . Smokeless tobacco: Never Used  Vaping Use  . Vaping Use: Never used  Substance Use Topics  . Alcohol use: Never  . Drug use: Never    Home Medications Prior to Admission medications   Medication Sig Start Date End Date Taking? Authorizing Provider  acetaminophen (TYLENOL) 500 MG tablet Take 1,000 mg by mouth every 6 (six) hours as needed for moderate pain.   Yes [provider]  albuterol (VENTOLIN HFA) 108 (90 Base) MCG/ACT inhaler Inhale 2 puffs into the lungs as needed for wheezing or shortness of breath.   Yes [provider]  BREZTRI AEROSPHERE 160-9-4.8 MCG/ACT AERO Inhale 2 puffs into the lungs in the morning and at bedtime.  03/24/20  Yes [provider]  Cholecalciferol (VITAMIN D-3) 25 MCG (1000 UT) CAPS Take 1,000 Units by mouth daily.   Yes [provider]  cycloSPORINE (RESTASIS) 0.05 % ophthalmic emulsion Place 1 drop into both eyes 2 (two) times daily.    Yes [provider]  flecainide (TAMBOCOR) 50 MG tablet Take 50 mg by mouth 2 (two) times daily.  09/04/19  Yes [provider]  furosemide (LASIX) 40 MG tablet Take 40 mg by mouth daily with breakfast.    Yes [provider]  levETIRAcetam (KEPPRA) 500 MG tablet Take 1 tablet (500 mg total) by mouth 2 (two) times daily. 11/06/20  Yes [place], MD  metFORMIN (GLUCOPHAGE) 500 MG tablet Take 500 mg by mouth daily with breakfast.   Yes [provider]  Multiple Vitamins-Minerals (PRESERVISION AREDS 2 PO) Take 2 capsules by mouth daily with breakfast.    Yes [provider]  Omega-3 Fatty Acids (FISH OIL) 1000 MG CAPS Take 1,000 mg by mouth daily with breakfast.    Yes [provider]  omeprazole (PRILOSEC) 40 MG capsule Take 40 mg by mouth daily. 08/17/20  Yes [provider]  OZEMPIC, 1  MG/DOSE, 4 MG/3ML SOPN Inject 1 mg into the skin once a week. Sundays 10/24/20  Yes [provider]  promethazine (PHENERGAN) 12.5 MG tablet Take 12.5 mg by mouth at bedtime. 09/28/20  Yes [provider]  rivaroxaban (XARELTO) 20 MG TABS tablet Take 20 mg by mouth at bedtime.    Yes [provider]  tiZANidine (ZANAFLEX) 4 MG tablet Take 4 mg by mouth at bedtime as needed for muscle spasms.    Yes [provider]  oxyCODONE-acetaminophen (PERCOCET) 7.5-325 MG tablet Take 1 tablet by mouth every 4 (four) hours as needed for severe pain. Patient not taking: Reported  on 11/14/2020 10/18/20 10/18/21  Daryll Brod, MD  sertraline (ZOLOFT) 25 MG tablet Take 1 tablet (25 mg total) by mouth daily. Patient not taking: No sig reported 11/11/20 11/11/21  Derrill Center, NP    Allergies    Acetaminophen-codeine, Other, Tramadol, Coconut oil, Meloxicam, and Tomato  Review of Systems   Review of Systems  Constitutional: Negative for chills and fever.  HENT: Negative for congestion.   Eyes: Negative for visual disturbance.  Respiratory: Negative for shortness of breath.   Cardiovascular: Positive for chest pain.  Gastrointestinal: Positive for nausea and vomiting. Negative for abdominal pain.  Genitourinary: Negative for dysuria and flank pain.  Musculoskeletal: Negative for back pain, neck pain and neck stiffness.  Skin: Negative for rash.  Neurological: Positive for dizziness and light-headedness. Negative for headaches.    Physical Exam Updated Vital Signs BP 109/72   Pulse 79   Temp 98 F (36.7 C) (Oral)   Resp (!) 23   Ht 5\' 5"  (1.651 m)   Wt (!) 140.2 kg   SpO2 98%   BMI 51.42 kg/m   Physical Exam Vitals and nursing note reviewed.  Constitutional:      Appearance: She is well-developed and well-nourished.  HENT:     Head: Normocephalic and atraumatic.     Comments: Dry mm Eyes:     General:        Right eye: No discharge.        Left eye: No  discharge.     Conjunctiva/sclera: Conjunctivae normal.  Neck:     Trachea: No tracheal deviation.  Cardiovascular:     Rate and Rhythm: Normal rate and regular rhythm.  Pulmonary:     Effort: Pulmonary effort is normal.     Breath sounds: Normal breath sounds.  Abdominal:     General: There is no distension.     Palpations: Abdomen is soft.     Tenderness: There is no abdominal tenderness. There is no guarding.  Musculoskeletal:        General: No edema.     Cervical back: Normal range of motion and neck supple.     Right lower leg: No edema.     Left lower leg: No edema.     Comments: Patient has tenderness right dorsal wrist without deformity, left anterior shoulder with range of motion.  No other focal tenderness in extremities.  Skin:    General: Skin is warm.     Capillary Refill: Capillary refill takes less than 2 seconds.     Findings: No rash.  Neurological:     General: No focal deficit present.     Mental Status: She is alert and oriented to person, place, and time.  Psychiatric:        Mood and Affect: Mood and affect normal.     ED Results / Procedures / Treatments   Labs (all labs ordered are listed, but only abnormal results are displayed) Labs Reviewed  COMPREHENSIVE METABOLIC PANEL - Abnormal; Notable for the following components:      Result Value   Albumin 3.2 (*)    Alkaline Phosphatase 178 (*)    All other components within normal limits  CBC WITH DIFFERENTIAL/PLATELET - Abnormal; Notable for the following components:   MCH 25.7 (*)    All other components within normal limits  SALICYLATE LEVEL - Abnormal; Notable for the following components:   Salicylate Lvl <3.7 (*)    All other components within normal limits  ACETAMINOPHEN LEVEL -  Abnormal; Notable for the following components:   Acetaminophen (Tylenol), Serum <10 (*)    All other components within normal limits  RESP PANEL BY RT-PCR (FLU A&B, COVID) ARPGX2  ETHANOL  MAGNESIUM  TROPONIN I  (HIGH SENSITIVITY)  TROPONIN I (HIGH SENSITIVITY)    EKG EKG Interpretation  Date/Time:  Monday November 14 2020 14:04:43 EST Ventricular Rate:  73 PR Interval:    QRS Duration: 85 QT Interval:  487 QTC Calculation: 537 R Axis:   56 Text Interpretation: Atrial flutter with varied AV block, Low voltage, precordial leads Repol abnrm suggests ischemia, diffuse leads Prolonged QT interval since last tracing no significant change Confirmed by Daleen Bo [phone]) on 11/14/2020 4:31:23 PM   Radiology DG Wrist Complete Right  Result Date: 11/14/2020 CLINICAL DATA:  Fall. EXAM: RIGHT WRIST - COMPLETE 3+ VIEW COMPARISON:  07/20/2020 FINDINGS: There is no evidence of fracture or dislocation. There is no evidence of arthropathy or other focal bone abnormality. Soft tissues are unremarkable. IMPRESSION: Negative. Electronically Signed   By: Franchot Gallo M.D.   On: 11/14/2020 14:45   DG Chest Portable 1 View  Result Date: 11/14/2020 CLINICAL DATA:  Fall, chest pain EXAM: PORTABLE CHEST 1 VIEW COMPARISON:  09/29/2020 FINDINGS: Stable mild cardiomegaly. Both lungs are clear. No pleural effusion or pneumothorax. The visualized skeletal structures are unremarkable. IMPRESSION: No acute process in the chest. Electronically Signed   By: Macy Mis M.D.   On: 11/14/2020 14:46   DG Shoulder Left Port  Result Date: 11/14/2020 CLINICAL DATA:  Fall EXAM: LEFT SHOULDER COMPARISON:  None. FINDINGS: Normal alignment no fracture. Narrowing of the acromial humeral distance consistent with rotator cuff impingement. Mild degenerative change AC joint. IMPRESSION: Negative for fracture Electronically Signed   By: Franchot Gallo M.D.   On: 11/14/2020 14:44    Procedures Procedures   Medications Ordered in ED Medications  sodium chloride 0.9 % bolus 500 mL (0 mLs Intravenous Stopped 11/14/20 1603)  ondansetron (ZOFRAN) injection 4 mg (4 mg Intravenous Given 11/14/20 1403)  metoCLOPramide (REGLAN) injection  10 mg (10 mg Intravenous Given 11/14/20 1621)    ED Course  I have reviewed the triage vital signs and the nursing notes.  Pertinent labs & imaging results that were available during my care of the patient were reviewed by me and considered in my medical decision making (see chart for details).    MDM Rules/Calculators/A&P                          Patient presents with recurrent nausea vomiting, dizziness and general weakness since Saturday.  Differential broad including infectious/toxin, viral/COVID, atypical ACS with constant chest pain, eletrolyte abnormalities, stroke however normal neurologic exam, veritgo as hx of recurrent, other.  Plan for blood work, EKG, troponin, observation, EKG.  IV fluid bolus and Zofran ordered. Blood work reviewed showing normal electrolytes, normal kidney function, normal hemoglobin, negative salicylate and Tylenol levels.  Ethanol normal.  Patient had no significant provement reassessment. Vomited again and failed p.o. challenge. With presyncopal history, persistent vomiting, multiple medical problems recommendation to admit/observation for further evaluation. Patient signed out to continue to monitor until admission. Tox testing unremarkable. Vital signs normal on reassessment.  X-ray of left shoulder and right wrist no acute fracture.  Chest x-ray no acute findings. EKG atrial flutter, history of similar.  Reviewed.  ERYKAH LIPPERT was evaluated in Emergency Department on 11/14/2020 for the symptoms described in the history of present illness.  She was evaluated in the context of the global COVID-19 pandemic, which necessitated consideration that the patient might be at risk for infection with the SARS-CoV-2 virus that causes COVID-19. Institutional protocols and algorithms that pertain to the evaluation of patients at risk for COVID-19 are in a state of rapid change based on information released by regulatory bodies including the CDC and federal and state  organizations. These policies and algorithms were followed during the patient's care in the ED.  Final Clinical Impression(s) / ED Diagnoses Final diagnoses:  Vomiting, persistent, in adult  Acute chest pain  Near syncope    Rx / DC Orders ED Discharge Orders    None       Elnora Morrison, MD 11/14/20 1633    Elnora Morrison, MD 11/15/20 [phone]

## 2020-11-14 NOTE — ED Provider Notes (Signed)
4:30 PM plan- Admit for vomiting.  Patient has had vomiting for several days, associated with dizziness, and a fall today.  She has also had ongoing chest discomfort.  She has multiple medical problems and on multiple medications including Xarelto, and flecainide.  She has chronic atrial fibrillation.  Patient feels like her vomiting has worsened today.  She does not have any diarrhea.  She has a mild headache that she attributes to the vomiting.  She feels like she might be constipated but denies abdominal distention.  She is not sure if she has been able to keep her medicines down, or not.  Evaluation here today, indicates negative fractures.  Troponin normal, Covid negative, CBC normal.  Metabolic panel reassuring.  Vital signs with mild persistent tachypnea but normal oxygenation.  5:35 PM -at this time patient's abdomen is soft and nontender.  There is no dysarthria or aphasia.  No respiratory distress.  Patient's vomiting has not been controlled, she vomited with oral fluids after being treated with Reglan, and Zofran.  Will contact hospitalist for observation admission for vomiting.   EKG Interpretation  Date/Time:  Monday November 14 2020 14:04:43 EST Ventricular Rate:  73 PR Interval:    QRS Duration: 85 QT Interval:  487 QTC Calculation: 537 R Axis:   56 Text Interpretation: Atrial flutter with varied AV block, Low voltage, precordial leads Repol abnrm suggests ischemia, diffuse leads Prolonged QT interval since last tracing no significant change Confirmed by Daleen Bo [phone]) on 11/14/2020 4:31:23 PM        5:35 PM-Consult complete with hospitalist. Patient case explained and discussed. He agrees to admit patient for further evaluation and treatment. Call ended at 5:55 PM   Daleen Bo, MD 11/14/20 [phone]

## 2020-11-15 ENCOUNTER — Observation Stay (HOSPITAL_COMMUNITY): Payer: Medicare Other

## 2020-11-15 ENCOUNTER — Other Ambulatory Visit (HOSPITAL_COMMUNITY): Payer: Medicare Other | Attending: Psychiatry | Admitting: Psychiatry

## 2020-11-15 DIAGNOSIS — F32A Depression, unspecified: Secondary | ICD-10-CM | POA: Diagnosis not present

## 2020-11-15 DIAGNOSIS — F329 Major depressive disorder, single episode, unspecified: Secondary | ICD-10-CM | POA: Insufficient documentation

## 2020-11-15 DIAGNOSIS — K5641 Fecal impaction: Secondary | ICD-10-CM | POA: Diagnosis not present

## 2020-11-15 DIAGNOSIS — R45851 Suicidal ideations: Secondary | ICD-10-CM | POA: Insufficient documentation

## 2020-11-15 DIAGNOSIS — F331 Major depressive disorder, recurrent, moderate: Secondary | ICD-10-CM

## 2020-11-15 DIAGNOSIS — F419 Anxiety disorder, unspecified: Secondary | ICD-10-CM | POA: Diagnosis not present

## 2020-11-15 LAB — GLUCOSE, CAPILLARY
Glucose-Capillary: 108 mg/dL — ABNORMAL HIGH (ref 70–99)
Glucose-Capillary: 77 mg/dL (ref 70–99)
Glucose-Capillary: 86 mg/dL (ref 70–99)

## 2020-11-15 LAB — BASIC METABOLIC PANEL
Anion gap: 7 (ref 5–15)
BUN: 12 mg/dL (ref 6–20)
CO2: 25 mmol/L (ref 22–32)
Calcium: 9.5 mg/dL (ref 8.9–10.3)
Chloride: 107 mmol/L (ref 98–111)
Creatinine, Ser: 0.96 mg/dL (ref 0.44–1.00)
GFR, Estimated: >60 mL/min (ref >60)
Glucose, Bld: 113 mg/dL — ABNORMAL HIGH (ref 70–99)
Potassium: 3.8 mmol/L (ref 3.5–5.1)
Sodium: 139 mmol/L (ref 135–145)

## 2020-11-15 LAB — HEMOGLOBIN A1C
Hgb A1c MFr Bld: 5.3 % (ref 4.8–5.6)
Mean Plasma Glucose: 105.41 mg/dL

## 2020-11-15 LAB — TSH: TSH: 3.58 u[IU]/mL (ref 0.350–4.500)

## 2020-11-15 MED ORDER — POTASSIUM CHLORIDE CRYS ER 20 MEQ PO TBCR
40.0000 meq | EXTENDED_RELEASE_TABLET | Freq: Once | ORAL | Status: AC
  Administered 2020-11-15: 40 meq via ORAL
  Filled 2020-11-15: qty 2

## 2020-11-15 MED ORDER — IOHEXOL 300 MG/ML  SOLN
100.0000 mL | Freq: Once | INTRAMUSCULAR | Status: AC | PRN
  Administered 2020-11-15: 100 mL via INTRAVENOUS

## 2020-11-15 MED ORDER — BOOST / RESOURCE BREEZE PO LIQD CUSTOM
1.0000 | Freq: Three times a day (TID) | ORAL | Status: DC
  Administered 2020-11-15 – 2020-11-17 (×4): 1 via ORAL

## 2020-11-15 MED ORDER — SODIUM CHLORIDE 0.9 % IV SOLN: INTRAVENOUS | Status: AC

## 2020-11-15 MED ORDER — INSULIN ASPART 100 UNIT/ML ~~LOC~~ SOLN: 0.0000 [IU] | Freq: Three times a day (TID) | SUBCUTANEOUS | Status: DC

## 2020-11-15 NOTE — Progress Notes (Signed)
RT set up CPAP and placed on patient. Patient tolerating CPAP settings well at this time. RT will monitor as needed.

## 2020-11-15 NOTE — Plan of Care (Signed)
?  Problem: Clinical Measurements: ?Goal: Ability to maintain clinical measurements within normal limits will improve ?Outcome: Progressing ?Goal: Will remain free from infection ?Outcome: Progressing ?Goal: Diagnostic test results will improve ?Outcome: Progressing ?  ?

## 2020-11-15 NOTE — ED Notes (Signed)
RN attempted report x 2 no answer-Monique,RN

## 2020-11-15 NOTE — Progress Notes (Signed)
PROGRESS NOTE    Meghan [name]  HGD:[id number] DOB: September 22, 1965 DOA: 11/14/2020 PCP: Beverley Fiedler, FNP   Chief Complaint  Patient presents with  . Emesis  . Chest Pain  . Weakness    Brief Narrative: 55 year old female with PAF on Xarelto, flecainide, chronic diastolic CHF, seizure disorder, morbid obesity BMI more than 50, diabetes mellitus presented with constipation no bowel movement since 5 days prior to admission and also having nausea vomiting multiple times after eating or drinking. She was seen in the ED x-ray showed no obstruction but large amount of stool in colon, CBC BMP relatively stable LFTs normal, troponin negative. Enemas given and admitted.  Subjective: 8 Bm since enema Still nausea after breakfast and threw up cranberry juice and jello. Otherwise he denies any abdominal pain chest pain fever chills. No abdomen distention.   Assessment & Plan: Fecal impaction/severe constipation with nausea vomiting: X-ray abdomen no obstruction bowel gas pattern insert large amount of colon.  Multiple bowel movement after enema.  On clear liquid diet> full liquid diet and advance as tolerated.  Continue supportive measures.  Do not tolerate food well.  Denies abdominal pain distention or tenderness.  Repeat x-ray abdomen today.  If persistent symptoms low threshold for CT abdomen  Dehydration/hypovolemia due to vomiting, received fluids overnight.  Encourage oral intake.  PAF resume flecainide and Xarelto Chronic diastolic CHF compensated.  Holding Lasix due to dehydration. Asthma no respiratory complaint Seizure disorder continue home Keppra Diabetes mellitus we will hold Metformin, add sliding scale insulin.  Sugar is stable Recent Labs  Lab 11/15/20 1148  GLUCAP 77   Morbid obesity BMI more than 50 will benefit weight loss and outpatient follow-up with PCP  Diet Order            Diet full liquid Room service appropriate? Yes with Assist; Fluid consistency: Thin   Diet effective now                 Patient's Body mass index is 51.42 kg/m. DVT prophylaxis: Xarelto Code Status:   Code Status: Full Code  Family Communication: plan of care discussed with patient at bedside.  Status is: Observation Remains hospitalized for ongoing monitoring of nausea vomiting difficulty tolerating diet, diet being advanced slowly to make sure she is able to tolerate repeating x-ray abdomen.  Dispo: The patient is from: Home              Anticipated d/c is to: Home              Patient currently is not medically stable to d/c.   Difficult to place patient No   Unresulted Labs (From admission, onward)         None      Medications reviewed:  Scheduled Meds: . cycloSPORINE  1 drop Both Eyes BID  . feeding supplement  1 Container Oral TID BM  . flecainide  50 mg Oral BID  . fluticasone furoate-vilanterol  1 puff Inhalation Daily  . levETIRAcetam  500 mg Oral BID  . metFORMIN  500 mg Oral Q breakfast  . pantoprazole  40 mg Oral Daily  . promethazine  12.5 mg Oral QHS  . rivaroxaban  20 mg Oral Q supper  . umeclidinium bromide  1 puff Inhalation Daily   Continuous Infusions:  Consultants:see note  Procedures:see note  Antimicrobials: Anti-infectives (From admission, onward)   None     Culture/Microbiology    Component Value Date/Time   SDES  06/22/2020 1607  URINE, CLEAN CATCH Performed at [facility], [place] [address], [place], [place] [address]    SPECREQUEST  06/22/2020 1607    NONE Performed at [facility], [place] [address], [place], [place] [address]    CULT MULTIPLE SPECIES PRESENT, SUGGEST RECOLLECTION (A) 06/22/2020 1607   REPTSTATUS 06/23/2020 FINAL 06/22/2020 1607    Other culture-see note  Objective: Vitals: Today's Vitals   11/15/20 0244 11/15/20 0356 11/15/20 0802 11/15/20 0926  BP:  (!) 95/58 (!) 93/48   Pulse:  93 79   Resp:  20 18   Temp:  97.8 F (36.6 C) 98 F (36.7 C)    TempSrc:  Oral    SpO2:  97% 100% 98%  Weight:      Height:      PainSc: Asleep       Intake/Output Summary (Last 24 hours) at 11/15/2020 1303 Last data filed at 11/15/2020 0900 Gross per 24 hour  Intake 1486.17 ml  Output --  Net 1486.17 ml   Filed Weights   11/14/20 1237  Weight: (!) 140.2 kg   Weight change:   Intake/Output from previous day: 02/28 0701 - 03/01 0700 In: 1246.2 [I.V.:746.2; IV Piggyback:500] Out: -  Intake/Output this shift: Total I/O In: 240 [P.O.:240] Out: -  Filed Weights   11/14/20 1237  Weight: (!) 140.2 kg    Examination:  General exam: AAOx3, obese ,NAD, weak appearing. HEENT:Oral mucosa moist, Ear/Nose WNL grossly,dentition normal. Respiratory system: bilaterally diminished,no use of accessory muscle, non tender. Cardiovascular system: S1 & S2 +, regular, No JVD. Gastrointestinal system: Abdomen soft, mildly distended minimal tenderness,BS+. Nervous System:Alert, awake, moving extremities and grossly nonfocal Extremities: No edema, distal peripheral pulses palpable.  Skin: No rashes,no icterus. MSK: Normal muscle bulk,tone, power  Data Reviewed: I have personally reviewed following labs and imaging studies CBC: Recent Labs  Lab 11/14/20 1334  WBC 8.1  NEUTROABS 4.7  HGB 12.2  HCT 39.8  MCV 83.8  PLT 707   Basic Metabolic Panel: Recent Labs  Lab 11/14/20 1334 11/15/20 1115  NA 140 139  K 4.4 3.8  CL 105 107  CO2 24 25  GLUCOSE 92 113*  BUN 18 12  CREATININE 0.89 0.96  CALCIUM 9.9 9.5  MG 2.2  --    GFR: Estimated Creatinine Clearance: 93.3 mL/min (by C-G formula based on SCr of 0.96 mg/dL). Liver Function Tests: Recent Labs  Lab 11/14/20 1334  AST 21  ALT 9  ALKPHOS 178*  BILITOT 0.9  PROT 7.1  ALBUMIN 3.2*   No results for input(s): LIPASE, AMYLASE in the last 168 hours. No results for input(s): AMMONIA in the last 168 hours. Coagulation Profile: No results for input(s): INR, PROTIME in the last 168  hours. Cardiac Enzymes: No results for input(s): CKTOTAL, CKMB, CKMBINDEX, TROPONINI in the last 168 hours. BNP (last 3 results) No results for input(s): PROBNP in the last 8760 hours. HbA1C: No results for input(s): HGBA1C in the last 72 hours. CBG: Recent Labs  Lab 11/15/20 1148  GLUCAP 77   Lipid Profile: No results for input(s): CHOL, HDL, LDLCALC, TRIG, CHOLHDL, LDLDIRECT in the last 72 hours. Thyroid Function Tests: Recent Labs    11/15/20 0515  TSH 3.580   Anemia Panel: No results for input(s): VITAMINB12, FOLATE, FERRITIN, TIBC, IRON, RETICCTPCT in the last 72 hours. Sepsis Labs: No results for input(s): PROCALCITON, LATICACIDVEN in the last 168 hours.  Recent Results (from the past 240 hour(s))  Resp Panel by RT-PCR (  Flu A&B, Covid) Nasopharyngeal Swab     Status: None   Collection Time: 11/14/20  2:05 PM   Specimen: Nasopharyngeal Swab; Nasopharyngeal(NP) swabs in vial transport medium  Result Value Ref Range Status   SARS Coronavirus 2 by RT PCR NEGATIVE NEGATIVE Final    Comment: (NOTE) SARS-CoV-2 target nucleic acids are NOT DETECTED.  The SARS-CoV-2 RNA is generally detectable in upper respiratory specimens during the acute phase of infection. The lowest concentration of SARS-CoV-2 viral copies this assay can detect is 138 copies/mL. A negative result does not preclude SARS-Cov-2 infection and should not be used as the sole basis for treatment or other patient management decisions. A negative result may occur with  improper specimen collection/handling, submission of specimen other than nasopharyngeal swab, presence of viral mutation(s) within the areas targeted by this assay, and inadequate number of viral copies(<138 copies/mL). A negative result must be combined with clinical observations, patient history, and epidemiological information. The expected result is Negative.  Fact Sheet for Patients:  EntrepreneurPulse.com.au  Fact  Sheet for Healthcare Providers:  IncredibleEmployment.be  This test is no t yet approved or cleared by the [place] FDA and  has been authorized for detection and/or diagnosis of SARS-CoV-2 by FDA under an Emergency Use Authorization (EUA). This EUA will remain  in effect (meaning this test can be used) for the duration of the COVID-19 declaration under Section 564(b)(1) of the Act, 21 U.S.C.section 360bbb-3(b)(1), unless the authorization is terminated  or revoked sooner.       Influenza A by PCR NEGATIVE NEGATIVE Final   Influenza B by PCR NEGATIVE NEGATIVE Final    Comment: (NOTE) The Xpert Xpress SARS-CoV-2/FLU/RSV plus assay is intended as an aid in the diagnosis of influenza from Nasopharyngeal swab specimens and should not be used as a sole basis for treatment. Nasal washings and aspirates are unacceptable for Xpert Xpress SARS-CoV-2/FLU/RSV testing.  Fact Sheet for Patients: EntrepreneurPulse.com.au  Fact Sheet for Healthcare Providers: IncredibleEmployment.be  This test is not yet approved or cleared by the [place] FDA and has been authorized for detection and/or diagnosis of SARS-CoV-2 by FDA under an Emergency Use Authorization (EUA). This EUA will remain in effect (meaning this test can be used) for the duration of the COVID-19 declaration under Section 564(b)(1) of the Act, 21 U.S.C. section 360bbb-3(b)(1), unless the authorization is terminated or revoked.  Performed at [facility], [place] [address], [place], [place] [address]      Radiology Studies: DG Wrist Complete Right  Result Date: 11/14/2020 CLINICAL DATA:  Fall. EXAM: RIGHT WRIST - COMPLETE 3+ VIEW COMPARISON:  07/20/2020 FINDINGS: There is no evidence of fracture or dislocation. There is no evidence of arthropathy or other focal bone abnormality. Soft tissues are unremarkable. IMPRESSION: Negative. Electronically Signed   By:  Franchot Gallo M.D.   On: 11/14/2020 14:45   DG Chest Portable 1 View  Result Date: 11/14/2020 CLINICAL DATA:  Fall, chest pain EXAM: PORTABLE CHEST 1 VIEW COMPARISON:  09/29/2020 FINDINGS: Stable mild cardiomegaly. Both lungs are clear. No pleural effusion or pneumothorax. The visualized skeletal structures are unremarkable. IMPRESSION: No acute process in the chest. Electronically Signed   By: Macy Mis M.D.   On: 11/14/2020 14:46   DG Shoulder Left Port  Result Date: 11/14/2020 CLINICAL DATA:  Fall EXAM: LEFT SHOULDER COMPARISON:  None. FINDINGS: Normal alignment no fracture. Narrowing of the acromial humeral distance consistent with rotator cuff impingement. Mild degenerative change AC joint. IMPRESSION: Negative for fracture  Electronically Signed   By: Franchot Gallo M.D.   On: 11/14/2020 14:44   DG Abd 2 Views  Result Date: 11/14/2020 CLINICAL DATA:  Nausea and vomiting EXAM: ABDOMEN - 2 VIEW COMPARISON:  CT 06/22/2020 FINDINGS: Lung bases are clear. No free air beneath the diaphragm. Surgical clips in the right upper quadrant. IVC filter to the right of L3 and L4. Non obstructed bowel gas pattern with large amount of stool in the colon. IMPRESSION: Negative. Electronically Signed   By: Donavan Foil M.D.   On: 11/14/2020 18:46     LOS: 0 days   Antonieta Pert, MD [facility]  11/15/2020, 1:03 PM

## 2020-11-15 NOTE — Progress Notes (Signed)
03/01 0436 E. [place],Np notified of [name] current BP 95/58. Pt denies lethargy or dizziness. Pt refused lactulose. RN will continue to monitor pt.

## 2020-11-16 ENCOUNTER — Other Ambulatory Visit (HOSPITAL_COMMUNITY): Payer: Medicare Other

## 2020-11-16 ENCOUNTER — Encounter (HOSPITAL_COMMUNITY): Payer: Self-pay

## 2020-11-16 DIAGNOSIS — K567 Ileus, unspecified: Secondary | ICD-10-CM | POA: Diagnosis not present

## 2020-11-16 DIAGNOSIS — R112 Nausea with vomiting, unspecified: Secondary | ICD-10-CM

## 2020-11-16 DIAGNOSIS — Z8673 Personal history of transient ischemic attack (TIA), and cerebral infarction without residual deficits: Secondary | ICD-10-CM | POA: Diagnosis not present

## 2020-11-16 DIAGNOSIS — Z7984 Long term (current) use of oral hypoglycemic drugs: Secondary | ICD-10-CM | POA: Diagnosis not present

## 2020-11-16 DIAGNOSIS — G4730 Sleep apnea, unspecified: Secondary | ICD-10-CM | POA: Diagnosis present

## 2020-11-16 DIAGNOSIS — E860 Dehydration: Secondary | ICD-10-CM | POA: Diagnosis present

## 2020-11-16 DIAGNOSIS — I5032 Chronic diastolic (congestive) heart failure: Secondary | ICD-10-CM | POA: Diagnosis present

## 2020-11-16 DIAGNOSIS — Z79899 Other long term (current) drug therapy: Secondary | ICD-10-CM | POA: Diagnosis not present

## 2020-11-16 DIAGNOSIS — I4892 Unspecified atrial flutter: Secondary | ICD-10-CM | POA: Diagnosis present

## 2020-11-16 DIAGNOSIS — G40909 Epilepsy, unspecified, not intractable, without status epilepticus: Secondary | ICD-10-CM | POA: Diagnosis present

## 2020-11-16 DIAGNOSIS — E861 Hypovolemia: Secondary | ICD-10-CM | POA: Diagnosis present

## 2020-11-16 DIAGNOSIS — Z9049 Acquired absence of other specified parts of digestive tract: Secondary | ICD-10-CM | POA: Diagnosis not present

## 2020-11-16 DIAGNOSIS — Z86711 Personal history of pulmonary embolism: Secondary | ICD-10-CM | POA: Diagnosis not present

## 2020-11-16 DIAGNOSIS — K5909 Other constipation: Secondary | ICD-10-CM | POA: Diagnosis not present

## 2020-11-16 DIAGNOSIS — Z7901 Long term (current) use of anticoagulants: Secondary | ICD-10-CM | POA: Diagnosis not present

## 2020-11-16 DIAGNOSIS — K5641 Fecal impaction: Secondary | ICD-10-CM | POA: Diagnosis present

## 2020-11-16 DIAGNOSIS — Z833 Family history of diabetes mellitus: Secondary | ICD-10-CM | POA: Diagnosis not present

## 2020-11-16 DIAGNOSIS — E559 Vitamin D deficiency, unspecified: Secondary | ICD-10-CM | POA: Diagnosis present

## 2020-11-16 DIAGNOSIS — E7800 Pure hypercholesterolemia, unspecified: Secondary | ICD-10-CM | POA: Diagnosis present

## 2020-11-16 DIAGNOSIS — Z86718 Personal history of other venous thrombosis and embolism: Secondary | ICD-10-CM | POA: Diagnosis not present

## 2020-11-16 DIAGNOSIS — I480 Paroxysmal atrial fibrillation: Secondary | ICD-10-CM | POA: Diagnosis present

## 2020-11-16 DIAGNOSIS — E119 Type 2 diabetes mellitus without complications: Secondary | ICD-10-CM | POA: Diagnosis present

## 2020-11-16 DIAGNOSIS — Z20822 Contact with and (suspected) exposure to covid-19: Secondary | ICD-10-CM | POA: Diagnosis present

## 2020-11-16 DIAGNOSIS — Z90710 Acquired absence of both cervix and uterus: Secondary | ICD-10-CM | POA: Diagnosis not present

## 2020-11-16 DIAGNOSIS — K219 Gastro-esophageal reflux disease without esophagitis: Secondary | ICD-10-CM | POA: Diagnosis present

## 2020-11-16 DIAGNOSIS — Z6841 Body Mass Index (BMI) 40.0 and over, adult: Secondary | ICD-10-CM | POA: Diagnosis not present

## 2020-11-16 HISTORY — DX: Nausea with vomiting, unspecified: R11.2

## 2020-11-16 LAB — GLUCOSE, CAPILLARY
Glucose-Capillary: 86 mg/dL (ref 70–99)
Glucose-Capillary: 89 mg/dL (ref 70–99)
Glucose-Capillary: 76 mg/dL (ref 70–99)
Glucose-Capillary: 103 mg/dL — ABNORMAL HIGH (ref 70–99)

## 2020-11-16 LAB — CBC
HCT: 36 % (ref 36.0–46.0)
Hemoglobin: 11.4 g/dL — ABNORMAL LOW (ref 12.0–15.0)
MCH: 26.1 pg (ref 26.0–34.0)
MCHC: 31.7 g/dL (ref 30.0–36.0)
MCV: 82.4 fL (ref 80.0–100.0)
Platelets: 231 10*3/uL (ref 150–400)
RBC: 4.37 MIL/uL (ref 3.87–5.11)
RDW: 14.6 % (ref 11.5–15.5)
WBC: 6 10*3/uL (ref 4.0–10.5)
nRBC: 0 % (ref 0.0–0.2)

## 2020-11-16 MED ORDER — PROCHLORPERAZINE EDISYLATE 10 MG/2ML IJ SOLN
10.0000 mg | Freq: Four times a day (QID) | INTRAMUSCULAR | Status: DC | PRN
  Administered 2020-11-17: 10 mg via INTRAVENOUS
  Filled 2020-11-16: qty 2

## 2020-11-16 MED ORDER — METOCLOPRAMIDE HCL 5 MG/ML IJ SOLN
5.0000 mg | Freq: Four times a day (QID) | INTRAMUSCULAR | Status: DC
  Administered 2020-11-16 – 2020-11-17 (×3): 5 mg via INTRAVENOUS
  Filled 2020-11-16 (×4): qty 2

## 2020-11-16 MED ORDER — SODIUM CHLORIDE 0.9 % IV SOLN: INTRAVENOUS | Status: DC

## 2020-11-16 NOTE — Progress Notes (Signed)
PROGRESS NOTE    Meghan [name]  OEH:[id number] DOB: 1966/01/22 DOA: 11/14/2020 PCP: Beverley Fiedler, FNP   Chief Complaint  Patient presents with  . Emesis  . Chest Pain  . Weakness    Brief Narrative: 55 year old female with PAF on Xarelto, flecainide, chronic diastolic CHF, seizure disorder, morbid obesity BMI more than 50, diabetes mellitus presented with constipation no bowel movement since 5 days prior to admission and also having nausea vomiting multiple times after eating or drinking. She was seen in the ED x-ray showed no obstruction but large amount of stool in colon, CBC BMP relatively stable LFTs normal, troponin negative. Enemas given and admitted.  Subjective: Pt reports wretching and vomiting with small sips of water, unable to keep anything down.     Assessment & Plan:  Intractable nausea and vomiting. Fecal impaction/severe constipation with nausea vomiting: X-ray abdomen no obstruction bowel gas pattern insert large amount of colon.  Multiple bowel movement after enema.  On clear liquid diet> full liquid diet and advance as tolerated.  Continue supportive measures.  Do not tolerate food well.  Denies abdominal pain distention or tenderness.  CT abdomen no acute findings.  Reviewed with patient.  With persistent intractable nausea and vomiting will ask for a GI consult. I reached out to Ferguson. Gribbon PA-C and requested consultation and was informed that Pt sees Dr. Benson Norway and she reached out to their office and requested that they do the consult.    Dehydration/hypovolemia due to vomiting, received fluids overnight.  Encourage oral intake.  PAF resume flecainide and Xarelto Chronic diastolic CHF compensated.  Holding Lasix due to dehydration. Asthma no respiratory complaint Seizure disorder continue home Keppra Diabetes mellitus Continue to hold Metformin, continue sliding scale insulin.   Recent Labs  Lab 11/15/20 1148 11/15/20 1601 11/15/20 2350  11/16/20 0750 11/16/20 1210  GLUCAP 77 86 108* 86 89   Morbid obesity BMI more than 50 will benefit weight loss and outpatient follow-up with PCP  Diet Order            Diet full liquid Room service appropriate? Yes with Assist; Fluid consistency: Thin  Diet effective now                 Patient's Body mass index is 51.42 kg/m. DVT prophylaxis: Xarelto Code Status:   Code Status: Full Code  Family Communication: plan of care discussed with patient at bedside.  Status is: Observation Remains hospitalized for ongoing monitoring of nausea vomiting difficulty tolerating diet, diet being advanced slowly to make sure she is able to tolerate repeating x-ray abdomen.  Dispo: The patient is from: Home              Anticipated d/c is to: Home              Patient currently is not medically stable to d/c.   Difficult to place patient No   Unresulted Labs (From admission, onward)         None      Medications reviewed:  Scheduled Meds: . cycloSPORINE  1 drop Both Eyes BID  . feeding supplement  1 Container Oral TID BM  . flecainide  50 mg Oral BID  . fluticasone furoate-vilanterol  1 puff Inhalation Daily  . insulin aspart  0-9 Units Subcutaneous TID WC  . levETIRAcetam  500 mg Oral BID  . pantoprazole  40 mg Oral Daily  . promethazine  12.5 mg Oral QHS  . rivaroxaban  20 mg Oral Q supper  . umeclidinium bromide  1 puff Inhalation Daily   Continuous Infusions:  Consultants:see note  Procedures:see note  Antimicrobials: Anti-infectives (From admission, onward)   None     Culture/Microbiology    Component Value Date/Time   SDES  06/22/2020 1607    URINE, CLEAN CATCH Performed at [facility], [place] [address] [place], [place] [address]    SPECREQUEST  06/22/2020 1607    NONE Performed at [facility], [place] [address] [place], [place] [address]    CULT MULTIPLE SPECIES PRESENT, SUGGEST RECOLLECTION (A) 06/22/2020 1607    REPTSTATUS 06/23/2020 FINAL 06/22/2020 1607    Other culture-see note  Objective: Vitals: Today's Vitals   11/16/20 1014 11/16/20 1100 11/16/20 1334 11/16/20 1417  BP:    (!) 102/46  Pulse:    75  Resp:    17  Temp:    98 F (36.7 C)  TempSrc:    Oral  SpO2:    100%  Weight:      Height:      PainSc: 8  0-No pain Asleep     Intake/Output Summary (Last 24 hours) at 11/16/2020 1438 Last data filed at 11/15/2020 2030 Gross per 24 hour  Intake --  Output 500 ml  Net -500 ml   Filed Weights   11/14/20 1237  Weight: (!) 140.2 kg   Weight change:   Intake/Output from previous day: 03/01 0701 - 03/02 0700 In: 240 [P.O.:240] Out: 500 [Urine:500] Intake/Output this shift: No intake/output data recorded. Filed Weights   11/14/20 1237  Weight: (!) 140.2 kg   Examination:  General exam: NAD cooperative.   HEENT:Oral mucosa moist, Ear/Nose WNL grossly,dentition normal. Respiratory system: bilaterally diminished,no use of accessory muscle, non tender. Cardiovascular system: S1 & S2 +, regular, No JVD. Gastrointestinal system: Abdomen soft, mildly distended minimal tenderness,BS+. Nervous System:Alert, awake, moving extremities and grossly nonfocal Extremities: No edema, distal peripheral pulses palpable.  Skin: No rashes,no icterus. MSK: Normal muscle bulk,tone, power  Data Reviewed: I have personally reviewed following labs and imaging studies CBC: Recent Labs  Lab 11/14/20 1334 11/16/20 0417  WBC 8.1 6.0  NEUTROABS 4.7  --   HGB 12.2 11.4*  HCT 39.8 36.0  MCV 83.8 82.4  PLT 310 283   Basic Metabolic Panel: Recent Labs  Lab 11/14/20 1334 11/15/20 1115  NA 140 139  K 4.4 3.8  CL 105 107  CO2 24 25  GLUCOSE 92 113*  BUN 18 12  CREATININE 0.89 0.96  CALCIUM 9.9 9.5  MG 2.2  --    GFR: Estimated Creatinine Clearance: 93.3 mL/min (by C-G formula based on SCr of 0.96 mg/dL). Liver Function Tests: Recent Labs  Lab 11/14/20 1334  AST 21  ALT 9  ALKPHOS  178*  BILITOT 0.9  PROT 7.1  ALBUMIN 3.2*   No results for input(s): LIPASE, AMYLASE in the last 168 hours. No results for input(s): AMMONIA in the last 168 hours. Coagulation Profile: No results for input(s): INR, PROTIME in the last 168 hours. Cardiac Enzymes: No results for input(s): CKTOTAL, CKMB, CKMBINDEX, TROPONINI in the last 168 hours. BNP (last 3 results) No results for input(s): PROBNP in the last 8760 hours. HbA1C: Recent Labs    11/15/20 1400  HGBA1C 5.3   CBG: Recent Labs  Lab 11/15/20 1148 11/15/20 1601 11/15/20 2350 11/16/20 0750 11/16/20 1210  GLUCAP 77 86 108* 86 89   Lipid Profile: No results for input(s): CHOL, HDL,  LDLCALC, TRIG, CHOLHDL, LDLDIRECT in the last 72 hours. Thyroid Function Tests: Recent Labs    11/15/20 0515  TSH 3.580   Anemia Panel: No results for input(s): VITAMINB12, FOLATE, FERRITIN, TIBC, IRON, RETICCTPCT in the last 72 hours. Sepsis Labs: No results for input(s): PROCALCITON, LATICACIDVEN in the last 168 hours.  Recent Results (from the past 240 hour(s))  Resp Panel by RT-PCR (Flu A&B, Covid) Nasopharyngeal Swab     Status: None   Collection Time: 11/14/20  2:05 PM   Specimen: Nasopharyngeal Swab; Nasopharyngeal(NP) swabs in vial transport medium  Result Value Ref Range Status   SARS Coronavirus 2 by RT PCR NEGATIVE NEGATIVE Final    Comment: (NOTE) SARS-CoV-2 target nucleic acids are NOT DETECTED.  The SARS-CoV-2 RNA is generally detectable in upper respiratory specimens during the acute phase of infection. The lowest concentration of SARS-CoV-2 viral copies this assay can detect is 138 copies/mL. A negative result does not preclude SARS-Cov-2 infection and should not be used as the sole basis for treatment or other patient management decisions. A negative result may occur with  improper specimen collection/handling, submission of specimen other than nasopharyngeal swab, presence of viral mutation(s) within  the areas targeted by this assay, and inadequate number of viral copies(<138 copies/mL). A negative result must be combined with clinical observations, patient history, and epidemiological information. The expected result is Negative.  Fact Sheet for Patients:  EntrepreneurPulse.com.au  Fact Sheet for Healthcare Providers:  IncredibleEmployment.be  This test is no t yet approved or cleared by the [place] FDA and  has been authorized for detection and/or diagnosis of SARS-CoV-2 by FDA under an Emergency Use Authorization (EUA). This EUA will remain  in effect (meaning this test can be used) for the duration of the COVID-19 declaration under Section 564(b)(1) of the Act, 21 U.S.C.section 360bbb-3(b)(1), unless the authorization is terminated  or revoked sooner.       Influenza A by PCR NEGATIVE NEGATIVE Final   Influenza B by PCR NEGATIVE NEGATIVE Final    Comment: (NOTE) The Xpert Xpress SARS-CoV-2/FLU/RSV plus assay is intended as an aid in the diagnosis of influenza from Nasopharyngeal swab specimens and should not be used as a sole basis for treatment. Nasal washings and aspirates are unacceptable for Xpert Xpress SARS-CoV-2/FLU/RSV testing.  Fact Sheet for Patients: EntrepreneurPulse.com.au  Fact Sheet for Healthcare Providers: IncredibleEmployment.be  This test is not yet approved or cleared by the [place] FDA and has been authorized for detection and/or diagnosis of SARS-CoV-2 by FDA under an Emergency Use Authorization (EUA). This EUA will remain in effect (meaning this test can be used) for the duration of the COVID-19 declaration under Section 564(b)(1) of the Act, 21 U.S.C. section 360bbb-3(b)(1), unless the authorization is terminated or revoked.  Performed at [facility], [place] [address] [place], [place] [address]      Radiology Studies: CT ABDOMEN PELVIS W  CONTRAST  Result Date: 11/15/2020 CLINICAL DATA:  Unspecified abdominal pain EXAM: CT ABDOMEN AND PELVIS WITH CONTRAST TECHNIQUE: Multidetector CT imaging of the abdomen and pelvis was performed using the standard protocol following bolus administration of intravenous contrast. CONTRAST:  131mL OMNIPAQUE IOHEXOL 300 MG/ML  SOLN COMPARISON:  CT 06/22/2020, 02/02/2020 FINDINGS: Lower chest: Lung bases demonstrate no acute consolidation or effusion. Mild atelectasis. Mild cardiomegaly. Hepatobiliary: No focal liver abnormality is seen. Status post cholecystectomy. No biliary dilatation. Pancreas: Unremarkable. No pancreatic ductal dilatation or surrounding inflammatory changes. Spleen: Small subcentimeter hypodense lesion posterior spleen, probably stable. Adrenals/Urinary  Tract: Adrenal glands are normal. Kidneys show no hydronephrosis. Mild retention of contrast on delayed images. The bladder is normal Stomach/Bowel: The stomach is nonenlarged. No dilated small bowel. Negative appendix. No acute bowel wall thickening Vascular/Lymphatic: Nonaneurysmal aorta. No suspicious nodes. IVC filter at the L3-L4 level with strut penetration and struts touching the anterior aspect L4 vertebral body and left common iliac artery as before. Probable small nodes at the gastrohepatic ligament without change. Reproductive: Status post hysterectomy. No adnexal masses. Other: No free air. Small free fluid in the pelvis. Low ventral hernia containing fat and small bowel but no evidence for obstruction. Musculoskeletal: No acute or significant osseous findings. IMPRESSION: 1. No CT evidence for acute intra-abdominal or pelvic abnormality. 2. Low ventral hernia containing fat and small bowel but no evidence for obstruction. 3. Small free fluid in the pelvis. 4. Slight retention of contrast on delayed images, recommend correlation with renal function laboratory values. Electronically Signed   By: Donavan Foil M.D.   On: 11/15/2020 20:07    DG Abd 2 Views  Result Date: 11/14/2020 CLINICAL DATA:  Nausea and vomiting EXAM: ABDOMEN - 2 VIEW COMPARISON:  CT 06/22/2020 FINDINGS: Lung bases are clear. No free air beneath the diaphragm. Surgical clips in the right upper quadrant. IVC filter to the right of L3 and L4. Non obstructed bowel gas pattern with large amount of stool in the colon. IMPRESSION: Negative. Electronically Signed   By: Donavan Foil M.D.   On: 11/14/2020 18:46    LOS: 0 days   Irwin Brakeman, MD [facility]  11/16/2020, 2:38 PM

## 2020-11-16 NOTE — Progress Notes (Signed)
Initial Nutrition Assessment  DOCUMENTATION CODES:   Morbid obesity  INTERVENTION:    Continue Boost Breeze po TID, each supplement provides 250 kcal and 9 grams of protein  NUTRITION DIAGNOSIS:   Inadequate oral intake related to altered GI function as evidenced by meal completion < 50%.  GOAL:   Patient will meet greater than or equal to 90% of their needs  MONITOR:   PO intake,Supplement acceptance,Diet advancement  REASON FOR ASSESSMENT:   Malnutrition Screening Tool    ASSESSMENT:   55 yo female admitted with emesis, chest pain, weakness. PMH includes PAF, CHF, seizure D/O, DM.   X-ray of abdomen on admission showed no obstruction. Patient had multiple BMs after enema on admission.  She now has intractable nausea and vomiting, fecal impaction/severe constipation suspected. She has wretching and vomiting with any intake and is unable to keep anything down.  GI has been consulted.  Patient thinks she has lost a few pounds due to not eating much at home for a few days PTA. From review of usual weights, no significant weight changes noted over the past 7 months. She has tried the Boost breeze supplements, but unable to drink much.   Labs reviewed.  Medications reviewed and include Reglan, Phenergan, Protonix. IVF off this afternoon.   NUTRITION - FOCUSED PHYSICAL EXAM:  Flowsheet Row Most Recent Value  Orbital Region No depletion  Upper Arm Region No depletion  Thoracic and Lumbar Region No depletion  Buccal Region No depletion  Temple Region No depletion  Clavicle Bone Region No depletion  Clavicle and Acromion Bone Region No depletion  Scapular Bone Region No depletion  Dorsal Hand No depletion  Patellar Region No depletion  Anterior Thigh Region No depletion  Posterior Calf Region No depletion  Edema (RD Assessment) None  Hair Reviewed  Eyes Reviewed  Mouth Reviewed  Skin Reviewed  Nails Reviewed       Diet Order:   Diet Order            Diet  full liquid Room service appropriate? Yes with Assist; Fluid consistency: Thin  Diet effective now                 EDUCATION NEEDS:   Not appropriate for education at this time  Skin:  Skin Assessment: Reviewed RN Assessment  Last BM:  3/1 type 7  Height:   Ht Readings from Last 1 Encounters:  11/14/20 5\' 5"  (1.651 m)    Weight:   Wt Readings from Last 1 Encounters:  11/14/20 (!) 140.2 kg    BMI:  Body mass index is 51.42 kg/m.  Estimated Nutritional Needs:   Kcal:  2000-2200  Protein:  110-130 gm  Fluid:  >/= 2 L    Lucas Mallow, RD, LDN, CNSC Please refer to Amion for contact information.

## 2020-11-16 NOTE — Progress Notes (Signed)
Virtual Visit via Video Note  I connected with [name] on 11/16/20 at  9:00 AM EST by a video enabled telemedicine application and verified that I am speaking with the correct person using two identifiers.  At orientation to the IOP program, Case Manager discussed the limitations of evaluation and management by telemedicine and the availability of in person appointments. The patient expressed understanding and agreed to proceed with virtual visits throughout the duration of the program.   Location:  Patient: Patient Home Provider: Home Office   History of Present Illness: MDD  Observations/Objective: Check In: Case Manager checked in with all participants to review discharge dates, insurance authorizations, work-related documents and needs from the treatment team. Client stated needs and engaged in discussion. Case Manager introduced new Client to group, with group members making introductions to start the joining process.   Initial Therapeutic Activity: Counselor gave instructions for the ECO-MAPP activity for those who were not in group yesterday. Returning group member continued to present their takeaways from the activity. Many noted that they are experiencing high levels of stress in the majority of their relationships. Counselor prompted all group members to reflect on what they can practically do to alleviate some stress in their life, through conversations, expressing needs, setting boundaries and reaching out for more support if needed.   Second Therapeutic Activity: Counselor closed program by allowing time to celebrate a graduating group member. Counselor shared reflections on progress and allow space for group members to share well wishes and appreciates to the graduating client. Counselor prompted graduating client to share takeaways, reflect on progress and final thoughts for the group.  Check Out: Client prompted group members to share one productivity activity or one  self-care practice they can engage in today. Client plans to rest and recover. Client endorsed safety plan to be followed to prevent safety issues. Client denied any current SI/HI/psychosis.  Assessment and Plan: Clinician recommends that Client remain in IOP treatment to better manage mental health symptoms, stabilization and to address treatment plan goals. Clinician recommends adherence to crisis/safety plan, taking medications as prescribed, and following up with medical professionals if any issues arise.   Follow Up Instructions: Clinician will send Webex link for next session. The Client was advised to call back or seek an in-person evaluation if the symptoms worsen or if the condition fails to improve as anticipated.     I provided 180 minutes of non-face-to-face time during this encounter.     Lise Auer, LCSW

## 2020-11-16 NOTE — Plan of Care (Signed)

## 2020-11-17 ENCOUNTER — Other Ambulatory Visit (HOSPITAL_COMMUNITY): Payer: Medicare Other

## 2020-11-17 DIAGNOSIS — K5641 Fecal impaction: Secondary | ICD-10-CM | POA: Diagnosis not present

## 2020-11-17 DIAGNOSIS — K5909 Other constipation: Secondary | ICD-10-CM

## 2020-11-17 DIAGNOSIS — R112 Nausea with vomiting, unspecified: Secondary | ICD-10-CM | POA: Diagnosis not present

## 2020-11-17 DIAGNOSIS — K567 Ileus, unspecified: Secondary | ICD-10-CM | POA: Diagnosis not present

## 2020-11-17 LAB — COMPREHENSIVE METABOLIC PANEL
ALT: 10 U/L (ref 0–44)
AST: 11 U/L — ABNORMAL LOW (ref 15–41)
Albumin: 3 g/dL — ABNORMAL LOW (ref 3.5–5.0)
Alkaline Phosphatase: 162 U/L — ABNORMAL HIGH (ref 38–126)
Anion gap: 8 (ref 5–15)
BUN: 8 mg/dL (ref 6–20)
CO2: 24 mmol/L (ref 22–32)
Calcium: 9.7 mg/dL (ref 8.9–10.3)
Chloride: 105 mmol/L (ref 98–111)
Creatinine, Ser: 0.9 mg/dL (ref 0.44–1.00)
GFR, Estimated: >60 mL/min (ref >60)
Glucose, Bld: 89 mg/dL (ref 70–99)
Potassium: 3.9 mmol/L (ref 3.5–5.1)
Sodium: 137 mmol/L (ref 135–145)
Total Bilirubin: 0.6 mg/dL (ref 0.3–1.2)
Total Protein: 6.5 g/dL (ref 6.5–8.1)

## 2020-11-17 LAB — CBC WITH DIFFERENTIAL/PLATELET
Abs Immature Granulocytes: 0.01 10*3/uL (ref 0.00–0.07)
Basophils Absolute: 0 10*3/uL (ref 0.0–0.1)
Basophils Relative: 0 %
Eosinophils Absolute: 0.1 10*3/uL (ref 0.0–0.5)
Eosinophils Relative: 3 %
HCT: 35.1 % — ABNORMAL LOW (ref 36.0–46.0)
Hemoglobin: 10.9 g/dL — ABNORMAL LOW (ref 12.0–15.0)
Immature Granulocytes: 0 %
Lymphocytes Relative: 35 %
Lymphs Abs: 1.9 10*3/uL (ref 0.7–4.0)
MCH: 25.8 pg — ABNORMAL LOW (ref 26.0–34.0)
MCHC: 31.1 g/dL (ref 30.0–36.0)
MCV: 83 fL (ref 80.0–100.0)
Monocytes Absolute: 0.5 10*3/uL (ref 0.1–1.0)
Monocytes Relative: 9 %
Neutro Abs: 2.8 10*3/uL (ref 1.7–7.7)
Neutrophils Relative %: 53 %
Platelets: 265 10*3/uL (ref 150–400)
RBC: 4.23 MIL/uL (ref 3.87–5.11)
RDW: 14.9 % (ref 11.5–15.5)
WBC: 5.3 10*3/uL (ref 4.0–10.5)
nRBC: 0 % (ref 0.0–0.2)

## 2020-11-17 LAB — MAGNESIUM: Magnesium: 2.1 mg/dL (ref 1.7–2.4)

## 2020-11-17 LAB — GLUCOSE, CAPILLARY: Glucose-Capillary: 79 mg/dL (ref 70–99)

## 2020-11-17 MED ORDER — POLYETHYLENE GLYCOL 3350 17 G PO PACK
17.0000 g | PACK | Freq: Two times a day (BID) | ORAL | Status: DC
  Administered 2020-11-17: 17 g via ORAL
  Filled 2020-11-17: qty 1

## 2020-11-17 MED ORDER — ALUM & MAG HYDROXIDE-SIMETH 200-200-20 MG/5ML PO SUSP: 15.0000 mL | Freq: Four times a day (QID) | ORAL | Status: DC | PRN

## 2020-11-17 MED ORDER — POLYETHYLENE GLYCOL 3350 17 G PO PACK: 17.0000 g | PACK | Freq: Two times a day (BID) | ORAL | 1 refills | Status: AC

## 2020-11-17 NOTE — Discharge Summary (Addendum)
Physician Discharge Summary  Meghan [name] SHF:[id number] DOB: 09-Mar-1966 DOA: 11/14/2020  PCP: Beverley Fiedler, FNP  GI: Dr. Alan Ripper - [facility]   Admit date: 11/14/2020 Discharge date: 11/17/2020  Admitted From:  Home  Disposition:  Home   Recommendations for Outpatient Follow-up:  1. Follow up with PCP in 5 days 2. Follow up with GI on 3/8 as scheduled.   Discharge Condition: STABLE   CODE STATUS: FULL    Brief Hospitalization Summary: Please see all hospital notes, images, labs for full details of the hospitalization. ADMISSION HPI: Meghan [name] is a 55 y.o. female with medical history significant of PAF on Xarelto and flecainide, chronic diastolic CHF, seizure disorder, morbid obesity, IIDM, presented with constipation, and nauseous vomit.  Patient has no history of constipation but last week she developed constipation her last bowel movement was 5 days ago.  Started from 2 days ago, she started to feel nausea and vomited multiple times each time she tried to eat or drink something.  Vomitus mostly stomach content no blood no bile, no foul smell.  She did stop to passing gas about 2 days ago.  She feels "annoying feeling" on right aspect of abdomen, but denies any pain.  She was just restarted Keppra the day before yesterday, but she does still feel her abdomen problem of nausea vomiting and constipation related to Keppra.  Denies any fever chills, no chest pain no shortness of breath, denies any illicit drug use.  ED Course: Work liver function, bilirubin within normal limits, potassium 4.4.  9.  X-ray showed no obstruction but large amount of stool in colon.    Hospital Course:   Brief Narrative: 55 year old female with PAF on Xarelto, flecainide, chronic diastolic CHF, seizure disorder, morbid obesity BMI more than 50, diabetes mellitus presented with constipation no bowel movement since 5 days prior to admission and also having nausea vomiting multiple times after  eating or drinking. She was seen in the ED x-ray showed no obstruction but large amount of stool in colon, CBC BMP relatively stable LFTs normal, troponin negative. Enemas given and admitted.  Subjective: Pt reports wretching and vomiting with small sips of water, unable to keep anything down.     Assessment & Plan:  Intractable nausea and vomiting. Fecal impaction/severe constipation with nausea vomiting: X-ray abdomen no obstruction bowel gas pattern insert large amount of colon.  Multiple bowel movement after enema.  On clear liquid diet> full liquid diet and advance as tolerated.  Pt now tolerating soft foods diet.  Pt was treated with supportive measures.  Denies abdominal pain distention or tenderness.  CT abdomen no acute findings.  Reviewed with patient.  GI consulted however symptoms have improved and it is felt that outpatient follow up would be fine.  Pt insists she will follow up with her new GI Dr. Alan Ripper.  Rx for miralax 17 gm BID.       Dehydration/hypovolemia due to vomiting, received fluids overnight.  Encouraged oral intake.  PAF resumed home flecainide and Xarelto Chronic diastolic CHF compensated. Resume home treatments.  Asthma no respiratory complaints Seizure disorder continue home Keppra Diabetes mellitus Continue to hold Metformin at discharge.     Morbid obesity BMI more than 50 will benefit weight loss and outpatient follow-up with PCP  Diet Order                  Diet full liquid Room service appropriate? Yes with Assist; Fluid consistency: Thin  Diet effective now  Patient's Body mass index is 51.42 kg/m. DVT prophylaxis: Xarelto Code Status:   Code Status: Full Code  Family Communication: plan of care discussed with patient at bedside.  Discharge Diagnoses:  Active Problems:   Ileus ([place])   Fecal impaction ([facility])   Intractable nausea and vomiting  Discharge Instructions:  Allergies as of 11/17/2020      Reactions    Acetaminophen-codeine Swelling, Rash, Other (See Comments)   Tylenol with Codeine, Tylenol #3,  (facial swelling, hives)   Other Hives, Other (See Comments)   Muscle relaxer that starts with a "T" caused hives (not tizanidine) January or February 2020- made the patient VERY sick   Tramadol Hives, Other (See Comments)   Patient stated "I was trippin' and I do not want that ever again"   Coconut Oil Rash, Other (See Comments)   ANY coconut products    Meloxicam Rash   Tomato Rash      Medication List    STOP taking these medications   oxyCODONE-acetaminophen 7.5-325 MG tablet Commonly known as: Percocet   sertraline 25 MG tablet Commonly known as: Zoloft   tiZANidine 4 MG tablet Commonly known as: ZANAFLEX     TAKE these medications   acetaminophen 500 MG tablet Commonly known as: TYLENOL Take 1,000 mg by mouth every 6 (six) hours as needed for moderate pain.   albuterol 108 (90 Base) MCG/ACT inhaler Commonly known as: VENTOLIN HFA Inhale 2 puffs into the lungs as needed for wheezing or shortness of breath.   Breztri Aerosphere 160-9-4.8 MCG/ACT Aero Generic drug: Budeson-Glycopyrrol-Formoterol Inhale 2 puffs into the lungs in the morning and at bedtime.   cycloSPORINE 0.05 % ophthalmic emulsion Commonly known as: RESTASIS Place 1 drop into both eyes 2 (two) times daily.   Fish Oil 1000 MG Caps Take 1,000 mg by mouth daily with breakfast.   flecainide 50 MG tablet Commonly known as: TAMBOCOR Take 50 mg by mouth 2 (two) times daily.   furosemide 40 MG tablet Commonly known as: LASIX Take 40 mg by mouth daily with breakfast.   levETIRAcetam 500 MG tablet Commonly known as: Keppra Take 1 tablet (500 mg total) by mouth 2 (two) times daily.   metFORMIN 500 MG tablet Commonly known as: GLUCOPHAGE Take 500 mg by mouth daily with breakfast.   omeprazole 40 MG capsule Commonly known as: PRILOSEC Take 40 mg by mouth daily.   Ozempic (1 MG/DOSE) 4 MG/3ML  Sopn Generic drug: Semaglutide (1 MG/DOSE) Inject 1 mg into the skin once a week. Sundays   polyethylene glycol 17 g packet Commonly known as: MIRALAX / GLYCOLAX Take 17 g by mouth 2 (two) times daily.   PRESERVISION AREDS 2 PO Take 2 capsules by mouth daily with breakfast.   promethazine 12.5 MG tablet Commonly known as: PHENERGAN Take 12.5 mg by mouth at bedtime.   rivaroxaban 20 MG Tabs tablet Commonly known as: XARELTO Take 20 mg by mouth at bedtime.   Vitamin D-3 25 MCG (1000 UT) Caps Take 1,000 Units by mouth daily.       Follow-up Information    Beverley Fiedler, FNP. Schedule an appointment as soon as possible for a visit in 5 day(s).   Specialty: Endocrinology Why: Hospital follow Up  Contact information: [place] [address] [phone]        Koleen Distance, MD. Schedule an appointment as soon as possible for a visit in 1 week(s).   Specialty: Gastroenterology Why: Hospital Follow Up  Contact information:  [address] [place] [address] [phone]              Allergies  Allergen Reactions  . Acetaminophen-Codeine Swelling, Rash and Other (See Comments)    Tylenol with Codeine, Tylenol #3,  (facial swelling, hives)  . Other Hives and Other (See Comments)    Muscle relaxer that starts with a "T" caused hives (not tizanidine) January or February 2020- made the patient VERY sick  . Tramadol Hives and Other (See Comments)    Patient stated "I was trippin' and I do not want that ever again"  . Coconut Oil Rash and Other (See Comments)    ANY coconut products   . Meloxicam Rash  . Tomato Rash   Allergies as of 11/17/2020      Reactions   Acetaminophen-codeine Swelling, Rash, Other (See Comments)   Tylenol with Codeine, Tylenol #3,  (facial swelling, hives)   Other Hives, Other (See Comments)   Muscle relaxer that starts with a "T" caused hives (not tizanidine) January or February 2020- made the patient VERY sick    Tramadol Hives, Other (See Comments)   Patient stated "I was trippin' and I do not want that ever again"   Coconut Oil Rash, Other (See Comments)   ANY coconut products    Meloxicam Rash   Tomato Rash      Medication List    STOP taking these medications   oxyCODONE-acetaminophen 7.5-325 MG tablet Commonly known as: Percocet   sertraline 25 MG tablet Commonly known as: Zoloft   tiZANidine 4 MG tablet Commonly known as: ZANAFLEX     TAKE these medications   acetaminophen 500 MG tablet Commonly known as: TYLENOL Take 1,000 mg by mouth every 6 (six) hours as needed for moderate pain.   albuterol 108 (90 Base) MCG/ACT inhaler Commonly known as: VENTOLIN HFA Inhale 2 puffs into the lungs as needed for wheezing or shortness of breath.   Breztri Aerosphere 160-9-4.8 MCG/ACT Aero Generic drug: Budeson-Glycopyrrol-Formoterol Inhale 2 puffs into the lungs in the morning and at bedtime.   cycloSPORINE 0.05 % ophthalmic emulsion Commonly known as: RESTASIS Place 1 drop into both eyes 2 (two) times daily.   Fish Oil 1000 MG Caps Take 1,000 mg by mouth daily with breakfast.   flecainide 50 MG tablet Commonly known as: TAMBOCOR Take 50 mg by mouth 2 (two) times daily.   furosemide 40 MG tablet Commonly known as: LASIX Take 40 mg by mouth daily with breakfast.   levETIRAcetam 500 MG tablet Commonly known as: Keppra Take 1 tablet (500 mg total) by mouth 2 (two) times daily.   metFORMIN 500 MG tablet Commonly known as: GLUCOPHAGE Take 500 mg by mouth daily with breakfast.   omeprazole 40 MG capsule Commonly known as: PRILOSEC Take 40 mg by mouth daily.   Ozempic (1 MG/DOSE) 4 MG/3ML Sopn Generic drug: Semaglutide (1 MG/DOSE) Inject 1 mg into the skin once a week. Sundays   polyethylene glycol 17 g packet Commonly known as: MIRALAX / GLYCOLAX Take 17 g by mouth 2 (two) times daily.   PRESERVISION AREDS 2 PO Take 2 capsules by mouth daily with breakfast.    promethazine 12.5 MG tablet Commonly known as: PHENERGAN Take 12.5 mg by mouth at bedtime.   rivaroxaban 20 MG Tabs tablet Commonly known as: XARELTO Take 20 mg by mouth at bedtime.   Vitamin D-3 25 MCG (1000 UT) Caps Take 1,000 Units by mouth daily.  Procedures/Studies: DG Wrist Complete Right  Result Date: 11/14/2020 CLINICAL DATA:  Fall. EXAM: RIGHT WRIST - COMPLETE 3+ VIEW COMPARISON:  07/20/2020 FINDINGS: There is no evidence of fracture or dislocation. There is no evidence of arthropathy or other focal bone abnormality. Soft tissues are unremarkable. IMPRESSION: Negative. Electronically Signed   By: Franchot Gallo M.D.   On: 11/14/2020 14:45   CT Head Wo Contrast  Result Date: 11/06/2020 CLINICAL DATA:  Seizure today. EXAM: CT HEAD WITHOUT CONTRAST TECHNIQUE: Contiguous axial images were obtained from the base of the skull through the vertex without intravenous contrast. COMPARISON:  April 04, 2020. FINDINGS: Brain: No evidence of acute infarction, hemorrhage, hydrocephalus, extra-axial collection or mass lesion/mass effect. Vascular: No hyperdense vessel or unexpected calcification. Skull: Normal. Negative for fracture or focal lesion. Sinuses/Orbits: No acute finding. Other: None. IMPRESSION: No acute intracranial abnormalities. No cause for the patient's symptoms identified. Electronically Signed   By: Dorise Bullion III M.D   On: 11/06/2020 17:53   CT ABDOMEN PELVIS W CONTRAST  Result Date: 11/15/2020 CLINICAL DATA:  Unspecified abdominal pain EXAM: CT ABDOMEN AND PELVIS WITH CONTRAST TECHNIQUE: Multidetector CT imaging of the abdomen and pelvis was performed using the standard protocol following bolus administration of intravenous contrast. CONTRAST:  152mL OMNIPAQUE IOHEXOL 300 MG/ML  SOLN COMPARISON:  CT 06/22/2020, 02/02/2020 FINDINGS: Lower chest: Lung bases demonstrate no acute consolidation or effusion. Mild atelectasis. Mild cardiomegaly. Hepatobiliary: No focal liver  abnormality is seen. Status post cholecystectomy. No biliary dilatation. Pancreas: Unremarkable. No pancreatic ductal dilatation or surrounding inflammatory changes. Spleen: Small subcentimeter hypodense lesion posterior spleen, probably stable. Adrenals/Urinary Tract: Adrenal glands are normal. Kidneys show no hydronephrosis. Mild retention of contrast on delayed images. The bladder is normal Stomach/Bowel: The stomach is nonenlarged. No dilated small bowel. Negative appendix. No acute bowel wall thickening Vascular/Lymphatic: Nonaneurysmal aorta. No suspicious nodes. IVC filter at the L3-L4 level with strut penetration and struts touching the anterior aspect L4 vertebral body and left common iliac artery as before. Probable small nodes at the gastrohepatic ligament without change. Reproductive: Status post hysterectomy. No adnexal masses. Other: No free air. Small free fluid in the pelvis. Low ventral hernia containing fat and small bowel but no evidence for obstruction. Musculoskeletal: No acute or significant osseous findings. IMPRESSION: 1. No CT evidence for acute intra-abdominal or pelvic abnormality. 2. Low ventral hernia containing fat and small bowel but no evidence for obstruction. 3. Small free fluid in the pelvis. 4. Slight retention of contrast on delayed images, recommend correlation with renal function laboratory values. Electronically Signed   By: Donavan Foil M.D.   On: 11/15/2020 20:07   DG Chest Portable 1 View  Result Date: 11/14/2020 CLINICAL DATA:  Fall, chest pain EXAM: PORTABLE CHEST 1 VIEW COMPARISON:  09/29/2020 FINDINGS: Stable mild cardiomegaly. Both lungs are clear. No pleural effusion or pneumothorax. The visualized skeletal structures are unremarkable. IMPRESSION: No acute process in the chest. Electronically Signed   By: Macy Mis M.D.   On: 11/14/2020 14:46   DG Shoulder Left Port  Result Date: 11/14/2020 CLINICAL DATA:  Fall EXAM: LEFT SHOULDER COMPARISON:  None.  FINDINGS: Normal alignment no fracture. Narrowing of the acromial humeral distance consistent with rotator cuff impingement. Mild degenerative change AC joint. IMPRESSION: Negative for fracture Electronically Signed   By: Franchot Gallo M.D.   On: 11/14/2020 14:44   DG Abd 2 Views  Result Date: 11/14/2020 CLINICAL DATA:  Nausea and vomiting EXAM: ABDOMEN - 2 VIEW COMPARISON:  CT  06/22/2020 FINDINGS: Lung bases are clear. No free air beneath the diaphragm. Surgical clips in the right upper quadrant. IVC filter to the right of L3 and L4. Non obstructed bowel gas pattern with large amount of stool in the colon. IMPRESSION: Negative. Electronically Signed   By: Donavan Foil M.D.   On: 11/14/2020 18:46   DG MINI C-ARM IMAGE ONLY  Result Date: 10/18/2020 There is no interpretation for this exam.  This order is for images obtained during a surgical procedure.  Please See "Surgeries" Tab for more information regarding the procedure.      Subjective: Pt reports feeling better.  She has been able to eat and drink.  She has been having bowel movements.    Discharge Exam: Vitals:   11/17/20 0741 11/17/20 0927  BP: 106/74 (!) 112/91  Pulse: 68 63  Resp: 20 20  Temp: 97.8 F (36.6 C) (!) 97.3 F (36.3 C)  SpO2: 100% 100%   Vitals:   11/16/20 2059 11/17/20 0547 11/17/20 0741 11/17/20 0927  BP: 98/71 103/60 106/74 (!) 112/91  Pulse: 68 66 68 63  Resp: 20 20 20 20   Temp: 97.7 F (36.5 C) 98 F (36.7 C) 97.8 F (36.6 C) (!) 97.3 F (36.3 C)  TempSrc: Oral Oral Oral Oral  SpO2: 98% 100% 100% 100%  Weight:      Height:       General: Pt is alert, awake, not in acute distress Cardiovascular: RRR, S1/S2 +, no rubs, no gallops Respiratory: CTA bilaterally, no wheezing, no rhonchi Abdominal: Soft, NT, ND, bowel sounds + Extremities: no edema, no cyanosis   The results of significant diagnostics from this hospitalization (including imaging, microbiology, ancillary and laboratory) are listed  below for reference.     Microbiology: Recent Results (from the past 240 hour(s))  Resp Panel by RT-PCR (Flu A&B, Covid) Nasopharyngeal Swab     Status: None   Collection Time: 11/14/20  2:05 PM   Specimen: Nasopharyngeal Swab; Nasopharyngeal(NP) swabs in vial transport medium  Result Value Ref Range Status   SARS Coronavirus 2 by RT PCR NEGATIVE NEGATIVE Final    Comment: (NOTE) SARS-CoV-2 target nucleic acids are NOT DETECTED.  The SARS-CoV-2 RNA is generally detectable in upper respiratory specimens during the acute phase of infection. The lowest concentration of SARS-CoV-2 viral copies this assay can detect is 138 copies/mL. A negative result does not preclude SARS-Cov-2 infection and should not be used as the sole basis for treatment or other patient management decisions. A negative result may occur with  improper specimen collection/handling, submission of specimen other than nasopharyngeal swab, presence of viral mutation(s) within the areas targeted by this assay, and inadequate number of viral copies(<138 copies/mL). A negative result must be combined with clinical observations, patient history, and epidemiological information. The expected result is Negative.  Fact Sheet for Patients:  EntrepreneurPulse.com.au  Fact Sheet for Healthcare Providers:  IncredibleEmployment.be  This test is no t yet approved or cleared by the [place] FDA and  has been authorized for detection and/or diagnosis of SARS-CoV-2 by FDA under an Emergency Use Authorization (EUA). This EUA will remain  in effect (meaning this test can be used) for the duration of the COVID-19 declaration under Section 564(b)(1) of the Act, 21 U.S.C.section 360bbb-3(b)(1), unless the authorization is terminated  or revoked sooner.       Influenza A by PCR NEGATIVE NEGATIVE Final   Influenza B by PCR NEGATIVE NEGATIVE Final    Comment: (NOTE) The Xpert Xpress  SARS-CoV-2/FLU/RSV plus assay is intended as an aid in the diagnosis of influenza from Nasopharyngeal swab specimens and should not be used as a sole basis for treatment. Nasal washings and aspirates are unacceptable for Xpert Xpress SARS-CoV-2/FLU/RSV testing.  Fact Sheet for Patients: EntrepreneurPulse.com.au  Fact Sheet for Healthcare Providers: IncredibleEmployment.be  This test is not yet approved or cleared by the [place] FDA and has been authorized for detection and/or diagnosis of SARS-CoV-2 by FDA under an Emergency Use Authorization (EUA). This EUA will remain in effect (meaning this test can be used) for the duration of the COVID-19 declaration under Section 564(b)(1) of the Act, 21 U.S.C. section 360bbb-3(b)(1), unless the authorization is terminated or revoked.  Performed at [facility], [place] [address] [place], [place] [address]      Labs: BNP (last 3 results) Recent Labs    11/27/19 1618  BNP 63.8   Basic Metabolic Panel: Recent Labs  Lab 11/14/20 1334 11/15/20 1115 11/17/20 0342  NA 140 139 137  K 4.4 3.8 3.9  CL 105 107 105  CO2 24 25 24   GLUCOSE 92 113* 89  BUN 18 12 8   CREATININE 0.89 0.96 0.90  CALCIUM 9.9 9.5 9.7  MG 2.2  --  2.1   Liver Function Tests: Recent Labs  Lab 11/14/20 1334 11/17/20 0342  AST 21 11*  ALT 9 10  ALKPHOS 178* 162*  BILITOT 0.9 0.6  PROT 7.1 6.5  ALBUMIN 3.2* 3.0*   No results for input(s): LIPASE, AMYLASE in the last 168 hours. No results for input(s): AMMONIA in the last 168 hours. CBC: Recent Labs  Lab 11/14/20 1334 11/16/20 0417 11/17/20 0342  WBC 8.1 6.0 5.3  NEUTROABS 4.7  --  2.8  HGB 12.2 11.4* 10.9*  HCT 39.8 36.0 35.1*  MCV 83.8 82.4 83.0  PLT 310 231 265   Cardiac Enzymes: No results for input(s): CKTOTAL, CKMB, CKMBINDEX, TROPONINI in the last 168 hours. BNP: Invalid input(s): POCBNP CBG: Recent Labs  Lab 11/15/20 2350 11/16/20 0750  11/16/20 1210 11/16/20 1637 11/16/20 2107  GLUCAP 108* 86 89 76 103*   D-Dimer No results for input(s): DDIMER in the last 72 hours. Hgb A1c Recent Labs    11/15/20 1400  HGBA1C 5.3   Lipid Profile No results for input(s): CHOL, HDL, LDLCALC, TRIG, CHOLHDL, LDLDIRECT in the last 72 hours. Thyroid function studies Recent Labs    11/15/20 0515  TSH 3.580   Anemia work up No results for input(s): VITAMINB12, FOLATE, FERRITIN, TIBC, IRON, RETICCTPCT in the last 72 hours. Urinalysis    Component Value Date/Time   COLORURINE COLORLESS (A) 09/29/2020 1228   APPEARANCEUR CLEAR 09/29/2020 1228   LABSPEC 1.006 09/29/2020 1228   PHURINE 5.0 09/29/2020 1228   GLUCOSEU NEGATIVE 09/29/2020 1228   HGBUR NEGATIVE 09/29/2020 1228   BILIRUBINUR NEGATIVE 09/29/2020 1228   KETONESUR NEGATIVE 09/29/2020 1228   PROTEINUR NEGATIVE 09/29/2020 1228   NITRITE NEGATIVE 09/29/2020 1228   LEUKOCYTESUR NEGATIVE 09/29/2020 1228   Sepsis Labs Invalid input(s): PROCALCITONIN,  WBC,  LACTICIDVEN Microbiology Recent Results (from the past 240 hour(s))  Resp Panel by RT-PCR (Flu A&B, Covid) Nasopharyngeal Swab     Status: None   Collection Time: 11/14/20  2:05 PM   Specimen: Nasopharyngeal Swab; Nasopharyngeal(NP) swabs in vial transport medium  Result Value Ref Range Status   SARS Coronavirus 2 by RT PCR NEGATIVE NEGATIVE Final    Comment: (NOTE) SARS-CoV-2 target nucleic acids are NOT DETECTED.  The SARS-CoV-2  RNA is generally detectable in upper respiratory specimens during the acute phase of infection. The lowest concentration of SARS-CoV-2 viral copies this assay can detect is 138 copies/mL. A negative result does not preclude SARS-Cov-2 infection and should not be used as the sole basis for treatment or other patient management decisions. A negative result may occur with  improper specimen collection/handling, submission of specimen other than nasopharyngeal swab, presence of viral  mutation(s) within the areas targeted by this assay, and inadequate number of viral copies(<138 copies/mL). A negative result must be combined with clinical observations, patient history, and epidemiological information. The expected result is Negative.  Fact Sheet for Patients:  EntrepreneurPulse.com.au  Fact Sheet for Healthcare Providers:  IncredibleEmployment.be  This test is no t yet approved or cleared by the [place] FDA and  has been authorized for detection and/or diagnosis of SARS-CoV-2 by FDA under an Emergency Use Authorization (EUA). This EUA will remain  in effect (meaning this test can be used) for the duration of the COVID-19 declaration under Section 564(b)(1) of the Act, 21 U.S.C.section 360bbb-3(b)(1), unless the authorization is terminated  or revoked sooner.       Influenza A by PCR NEGATIVE NEGATIVE Final   Influenza B by PCR NEGATIVE NEGATIVE Final    Comment: (NOTE) The Xpert Xpress SARS-CoV-2/FLU/RSV plus assay is intended as an aid in the diagnosis of influenza from Nasopharyngeal swab specimens and should not be used as a sole basis for treatment. Nasal washings and aspirates are unacceptable for Xpert Xpress SARS-CoV-2/FLU/RSV testing.  Fact Sheet for Patients: EntrepreneurPulse.com.au  Fact Sheet for Healthcare Providers: IncredibleEmployment.be  This test is not yet approved or cleared by the [place] FDA and has been authorized for detection and/or diagnosis of SARS-CoV-2 by FDA under an Emergency Use Authorization (EUA). This EUA will remain in effect (meaning this test can be used) for the duration of the COVID-19 declaration under Section 564(b)(1) of the Act, 21 U.S.C. section 360bbb-3(b)(1), unless the authorization is terminated or revoked.  Performed at [facility], [place] [address], [place], [place] [address]    Time coordinating discharge:  39  mins  SIGNED:  Irwin Brakeman, MD  [facility] 11/17/2020, 9:45 AM How to contact the [facility] Attending or Consulting provider [place] or covering provider during after hours [place], for this patient?  1. Check the care team in [facility] and look for a) attending/consulting [facility] provider listed and b) the [facility] team listed 2. Log into [email or web address] and use [place]'s universal password to access. If you do not have the password, please contact the hospital operator. 3. Locate the [facility] provider you are looking for under [facility] and page to a number that you can be directly reached. 4. If you still have difficulty reaching the provider, please page the [facility] (Director on Call) for the Hospitalists listed on amion for assistance.

## 2020-11-17 NOTE — Plan of Care (Signed)
Patient is not having any s/sx of vomiting this shift. Awaiting GI consult. Giving scheduled phenergan and reglan as ordered. Gave zanaflex tonight to help patient with her migraines. CPAP on at this time. NAD or needs voiced. Will continue to monitor and continue current POC.

## 2020-11-17 NOTE — Progress Notes (Signed)
Patient called the nurse's station and said "I can't breathe". Went into the room with charge nurse, Blanch Media and Juliene Pina, Therapist, sports. We removed the cpap from the patient, sat her up in bed, put her on 2L nasal cannula, gave 2 Tylenols for chest and back pain, and she took 2 puffs of her inhaler. Patient is calmer now. She says "I do not want to experience that again." Will allow medicine to kick in. Will continue to monitor.

## 2020-11-17 NOTE — Discharge Instructions (Signed)
Please follow up with your GI doctor as soon as possible  IMPORTANT INFORMATION: PAY CLOSE ATTENTION   PHYSICIAN DISCHARGE INSTRUCTIONS  Follow with Primary care provider  Beverley Fiedler, FNP  and other consultants as instructed by your Hospitalist Physician  [place], WORSEN OR NEW PROBLEM DEVELOPS   Please note: You were cared for by a hospitalist during your hospital stay. Every effort will be made to forward records to your primary care provider.  You can request that your primary care provider send for your hospital records if they have not received them.  Once you are discharged, your primary care physician will handle any further medical issues. Please note that NO REFILLS for any discharge medications will be authorized once you are discharged, as it is imperative that you return to your primary care physician (or establish a relationship with a primary care physician if you do not have one) for your post hospital discharge needs so that they can reassess your need for medications and monitor your lab values.  Please get a complete blood count and chemistry panel checked by your Primary MD at your next visit, and again as instructed by your Primary MD.  Get Medicines reviewed and adjusted: Please take all your medications with you for your next visit with your Primary MD  Laboratory/radiological data: Please request your Primary MD to go over all hospital tests and procedure/radiological results at the follow up, please ask your primary care provider to get all Hospital records sent to his/her office.  In some cases, they will be blood work, cultures and biopsy results pending at the time of your discharge. Please request that your primary care provider follow up on these results.  If you are diabetic, please bring your blood sugar readings with you to your follow up appointment with primary care.    Please call and  make your follow up appointments as soon as possible.    Also Note the following: If you experience worsening of your admission symptoms, develop shortness of breath, life threatening emergency, suicidal or homicidal thoughts you must seek medical attention immediately by calling 911 or calling your MD immediately  if symptoms less severe.  You must read complete instructions/literature along with all the possible adverse reactions/side effects for all the Medicines you take and that have been prescribed to you. Take any new Medicines after you have completely understood and accpet all the possible adverse reactions/side effects.   Do not drive when taking Pain medications or sleeping medications (Benzodiazepines)  Do not take more than prescribed Pain, Sleep and Anxiety Medications. It is not advisable to combine anxiety,sleep and pain medications without talking with your primary care practitioner  Special Instructions: If you have smoked or chewed Tobacco  in the last 2 yrs please stop smoking, stop any regular Alcohol  and or any Recreational drug use.  Wear Seat belts while driving.  Do not drive if taking any narcotic, mind altering or controlled substances or recreational drugs or alcohol.

## 2020-11-18 ENCOUNTER — Other Ambulatory Visit (HOSPITAL_COMMUNITY): Payer: Medicare Other | Admitting: Psychiatry

## 2020-11-18 ENCOUNTER — Other Ambulatory Visit: Payer: Self-pay

## 2020-11-18 ENCOUNTER — Encounter (HOSPITAL_COMMUNITY): Payer: Self-pay

## 2020-11-18 DIAGNOSIS — F411 Generalized anxiety disorder: Secondary | ICD-10-CM

## 2020-11-18 DIAGNOSIS — R45851 Suicidal ideations: Secondary | ICD-10-CM | POA: Diagnosis not present

## 2020-11-18 DIAGNOSIS — F32A Depression, unspecified: Secondary | ICD-10-CM | POA: Diagnosis not present

## 2020-11-18 DIAGNOSIS — F331 Major depressive disorder, recurrent, moderate: Secondary | ICD-10-CM

## 2020-11-18 DIAGNOSIS — F419 Anxiety disorder, unspecified: Secondary | ICD-10-CM | POA: Diagnosis not present

## 2020-11-18 DIAGNOSIS — F329 Major depressive disorder, single episode, unspecified: Secondary | ICD-10-CM | POA: Diagnosis not present

## 2020-11-18 NOTE — Progress Notes (Signed)
Virtual Visit via Video Note  I connected with [name] on 11/18/20 at  9:00 AM EST by a video enabled telemedicine application and verified that I am speaking with the correct person using two identifiers.  At orientation to the IOP program, Case Manager discussed the limitations of evaluation and management by telemedicine and the availability of in person appointments. The patient expressed understanding and agreed to proceed with virtual visits throughout the duration of the program.   Location:  Patient: Patient Home Provider: Home Office   History of Present Illness: MDD/GAD  Observations/Objective: Check In: Case Manager checked in with all participants to review discharge dates, insurance authorizations, work-related documents and needs from the treatment team. Client stated needs and engaged in discussion.   Initial Therapeutic Activity: Counselor facilitated therapeutic processing with group members to assess mood and current functioning, prompting group members to share about application of skills, progress and challenges in treatment/personal lives. Client reports that she returned home from the hospital yesterday. She is recovering well and looking forward to being active again. Client noted activities she has goals to rejoin, such as church, event planning and dancing in the future. Client presents with mild depression and mild anxiety. Client denied any current SI/HI/psychosis.  Second Therapeutic Activity: Counselor introduced [place], [place] Chaplain to present information and discussion on Grief and Loss. Group members engaged in discussion, sharing how grief impacts them, what comforts them, what emotions are felt, labeling losses, etc. After guest speaker logged off, Counselor prompted group to spend 10-15 minutes journaling to process personal grief and loss situations. Counselor processed entries with group and client's identified areas for additional processing in  individual therapy. Client noted grief and loss related to childhood, former marriage and being separated from family due to living out of state.   Check Out: Client prompted group members to share one productivity activity or one self-care practice they can engage in over the weekend. Client plans to rest, recover, watch church service from home. Client endorsed safety plan to be followed to prevent safety issues.   Assessment and Plan: Clinician recommends that Client remain in IOP treatment to better manage mental health symptoms, stabilization and to address treatment plan goals. Clinician recommends adherence to crisis/safety plan, taking medications as prescribed, and following up with medical professionals if any issues arise.   Follow Up Instructions: Clinician will send Webex link for next session. The Client was advised to call back or seek an in-person evaluation if the symptoms worsen or if the condition fails to improve as anticipated.     I provided 180 minutes of non-face-to-face time during this encounter.     Lise Auer, LCSW

## 2020-11-21 ENCOUNTER — Other Ambulatory Visit: Payer: Self-pay

## 2020-11-21 ENCOUNTER — Other Ambulatory Visit (HOSPITAL_COMMUNITY): Payer: Medicare Other | Admitting: Psychiatry

## 2020-11-21 DIAGNOSIS — F411 Generalized anxiety disorder: Secondary | ICD-10-CM

## 2020-11-21 DIAGNOSIS — F329 Major depressive disorder, single episode, unspecified: Secondary | ICD-10-CM | POA: Diagnosis not present

## 2020-11-21 DIAGNOSIS — F4310 Post-traumatic stress disorder, unspecified: Secondary | ICD-10-CM

## 2020-11-21 DIAGNOSIS — F331 Major depressive disorder, recurrent, moderate: Secondary | ICD-10-CM

## 2020-11-22 ENCOUNTER — Other Ambulatory Visit: Payer: Self-pay

## 2020-11-22 ENCOUNTER — Other Ambulatory Visit (HOSPITAL_COMMUNITY): Payer: Medicare Other | Admitting: Psychiatry

## 2020-11-22 DIAGNOSIS — F331 Major depressive disorder, recurrent, moderate: Secondary | ICD-10-CM

## 2020-11-22 DIAGNOSIS — F329 Major depressive disorder, single episode, unspecified: Secondary | ICD-10-CM | POA: Diagnosis not present

## 2020-11-22 DIAGNOSIS — F411 Generalized anxiety disorder: Secondary | ICD-10-CM

## 2020-11-23 ENCOUNTER — Ambulatory Visit (HOSPITAL_COMMUNITY): Payer: Medicare Other

## 2020-11-23 ENCOUNTER — Encounter (HOSPITAL_COMMUNITY): Payer: Self-pay

## 2020-11-23 NOTE — Progress Notes (Signed)
Virtual Visit via Video Note  I connected with Meghan [name] on 11/23/20 at  9:00 AM EST by a video enabled telemedicine application and verified that I am speaking with the correct person using two identifiers.  At orientation to the IOP program, Case Manager discussed the limitations of evaluation and management by telemedicine and the availability of in person appointments. The patient expressed understanding and agreed to proceed with virtual visits throughout the duration of the program.   Location:  Patient: Patient Home Provider: Home Office   History of Present Illness: MDD  Observations/Objective: Check In: Case Manager checked in with all participants to review discharge dates, insurance authorizations, work-related documents and needs from the treatment team. Client stated needs and engaged in discussion.   Initial Therapeutic Activity: Counselor facilitated therapeutic processing with group members to assess mood and current functioning, prompting group members to share about application of skills, progress and challenges in treatment/personal lives. Client reports that she was active and social over the weekend. Client reports feeling hopeful about healthier communication and connections with her family and friends from skills learned in session. Client presents with mild depression and mild anxiety. Client denied any current SI/HI/psychosis.  Second Therapeutic Activity: Counselor engaged group in discussion about having difficult conversations, in order to address depression and anxiety. Counselor provided group with a Ted Talk presentation on 3 Strategies in [employer]. Group processed takeaways afterwards. Counselor shared additional psychoeducation on starting conversations on difficult topics. Group members shared how to apply skills from session.  Check Out: Client prompted group members to share one productivity activity or one self-care practice they can  engage in today. Client plans to rest. Client endorsed safety plan to be followed to prevent safety issues.   Assessment and Plan: Clinician recommends that Client remain in IOP treatment to better manage mental health symptoms, stabilization and to address treatment plan goals. Clinician recommends adherence to crisis/safety plan, taking medications as prescribed, and following up with medical professionals if any issues arise.   Follow Up Instructions: Clinician will send Webex link for next session. The Client was advised to call back or seek an in-person evaluation if the symptoms worsen or if the condition fails to improve as anticipated.     I provided 180 minutes of non-face-to-face time during this encounter.     Lise Auer, LCSW

## 2020-11-23 NOTE — Progress Notes (Signed)
Virtual Visit via Video Note  I connected with Meghan [name] on 11/23/20 at  9:00 AM EST by a video enabled telemedicine application and verified that I am speaking with the correct person using two identifiers.  At orientation to the IOP program, Case Manager discussed the limitations of evaluation and management by telemedicine and the availability of in person appointments. The patient expressed understanding and agreed to proceed with virtual visits throughout the duration of the program.   Location:  Patient: Patient Home Provider: Home Office   History of Present Illness: MDD and GAD  Observations/Objective: Check In: Case Manager checked in with all participants to review discharge dates, insurance authorizations, work-related documents and needs from the treatment team. Client stated needs and engaged in discussion.   Initial Therapeutic Activity: Counselor facilitated therapeutic processing with group members to assess mood and current functioning, prompting group members to share about application of skills, progress and challenges in treatment/personal lives. Client reports that she is anxious today, in anticipation for a dentist appointment. Group provided coping strategies to use while at appointment to distract and reduce anxiety symptoms. Client presents with mild depression and moderate anxiety. Client denied any current SI/HI/psychosis.  Second Therapeutic Activity: Counselor reviewed skills discussed in yesterday's session related to having difficult conversations. Counselor provided psychoeducation on additional communication skills. Counselor presented information on "I" statements, "how to apologize", and fair fighting rules. Client discussed how they can apply skills in current communications with friends, family and co-workers to reduce conflict and tension in relationships. Counselor challenged group members to try one of the strategies this week in conversation to report  back to the group.   Check Out: Client prompted group members to share one productivity activity or one self-care practice they can engage in today. Client plans to attend appointment and work on social event planning with a friend. Client endorsed safety plan to be followed to prevent safety issues.   Assessment and Plan: Clinician recommends that Client remain in IOP treatment to better manage mental health symptoms, stabilization and to address treatment plan goals. Clinician recommends adherence to crisis/safety plan, taking medications as prescribed, and following up with medical professionals if any issues arise.   Follow Up Instructions: Clinician will send Webex link for next session. The Client was advised to call back or seek an in-person evaluation if the symptoms worsen or if the condition fails to improve as anticipated.     I provided 180 minutes of non-face-to-face time during this encounter.     Lise Auer, LCSW

## 2020-11-24 ENCOUNTER — Other Ambulatory Visit: Payer: Self-pay

## 2020-11-24 ENCOUNTER — Other Ambulatory Visit (HOSPITAL_COMMUNITY): Payer: Medicare Other

## 2020-11-24 NOTE — Progress Notes (Signed)
Virtual Visit via Telephone Note  I connected with Meghan [name] on 11/24/20 at  9:00 AM EST by telephone and verified that I am speaking with the correct person using two identifiers.  Location: Patient: Home  Provider: Office   I discussed the limitations, risks, security and privacy concerns of performing an evaluation and management service by telephone and the availability of in person appointments. I also discussed with the patient that there may be a patient responsible charge related to this service. The patient expressed understanding and agreed to proceed.    I discussed the assessment and treatment plan with the patient. The patient was provided an opportunity to ask questions and all were answered. The patient agreed with the plan and demonstrated an understanding of the instructions.   The patient was advised to call back or seek an in-person evaluation if the symptoms worsen or if the condition fails to improve as anticipated.  I provided 15 minutes of non-face-to-face time during this encounter.   Meghan Center, NP   [name] Discharge Summary  Meghan [name] [id number]  Admission date: 10/28/2020 Discharge date: 11/25/2020  Reason for admission: Per admission assessment note:Meghan [name] 55 year old African-American female was referred by her therapist Meghan [name] due to worsening depression and anxiety.  She reports ongoing stressors related to family, medical and financial strain.  States she moved from [place] due to declining [name] and to reestablish her life.  States she was residing with her daughter however her daughter became upset when she decided to move to [place].  States " my daughter is punishing me and not allowing me to see my grandson."  Reports a fairly close relationship with her grandson however have not been in contact with her daughter since leaving [place].  Reports her daughter has been residing in  local homeless shelters due to relationship between her current boyfriend.  Patient reports a strained relationship between she and the other twin daughter.  Patient does endorse passive suicidal ideations. "  Life is not worth living if I do not have a family."  Patient is very tearful and depressed throughout this assessment.  States "I have nothing to live for."  Denied suicidal ideation plan or intent  Family of Origin Issues: Reported family issues continue to be a working progress.  States she has been able to be in contact with her family and has set aside time to keep in touch via "Zoom".  Reports they have been supportive since she attended the program.  Progress in Program Toward Treatment Goals: Ongoing, [place] attended and participated with daily group session with active and engaged participation.  Denying suicidal or homicidal ideations.  Denies auditory or visual hallucinations.  Patient states her depression is 4 out of 10 with 10 being the worst.  She reports she continues to take medications as directed.  Declined medication refills during this assessment.  Progress (rationale): Keep follow-up with Meghan Flood LCSW and psychiatrist 3/22, and additional resources was provided for [place].      Take all medications as prescribed. Keep all follow-up appointments as scheduled.  Do not consume alcohol or use illegal drugs while on prescription medications. Report any adverse effects from your medications to your primary care provider promptly.  In the event of recurrent symptoms or worsening symptoms, call 911, a crisis hotline, or go to the nearest emergency department for evaluation.    Meghan Center, NP 11/24/2020

## 2020-11-25 ENCOUNTER — Other Ambulatory Visit (HOSPITAL_COMMUNITY): Payer: Medicare Other | Admitting: Psychiatry

## 2020-11-25 ENCOUNTER — Other Ambulatory Visit: Payer: Self-pay

## 2020-11-25 NOTE — Patient Instructions (Addendum)
D:  Patient successfully completed [facility]-IOP today.  A:  Discharge today.  Follow up with Dr. Altamese Marie on 12-06-20 @ 2pm and [name], LCSW on 11-29-20 @ 10 a.m.  Encouraged support groups  R:  Patient receptive.

## 2020-11-29 ENCOUNTER — Ambulatory Visit (INDEPENDENT_AMBULATORY_CARE_PROVIDER_SITE_OTHER): Payer: Medicare Other | Admitting: Licensed Clinical Social Worker

## 2020-11-29 ENCOUNTER — Other Ambulatory Visit: Payer: Self-pay

## 2020-11-29 DIAGNOSIS — F411 Generalized anxiety disorder: Secondary | ICD-10-CM

## 2020-11-29 DIAGNOSIS — F331 Major depressive disorder, recurrent, moderate: Secondary | ICD-10-CM

## 2020-11-29 DIAGNOSIS — F4310 Post-traumatic stress disorder, unspecified: Secondary | ICD-10-CM | POA: Diagnosis not present

## 2020-11-29 NOTE — Progress Notes (Signed)
Virtual Visit via Video Note   I connected with Meghan [name] on 11/29/20 at 10:00am by video enabled telemedicine application and verified that I am speaking with the correct person using two identifiers.   I discussed the limitations, risks, security and privacy concerns of performing an evaluation and management service by video and the availability of in person appointments. I also discussed with the patient that there may be a patient responsible charge related to this service. The patient expressed understanding and agreed to proceed.   I discussed the assessment and treatment plan with the patient. The patient was provided an opportunity to ask questions and all were answered. The patient agreed with the plan and demonstrated an understanding of the instructions.   The patient was advised to call back or seek an in-person evaluation if the symptoms worsen or if the condition fails to improve as anticipated.   I provided 1 hour of non-face-to-face time during this encounter.     [name], LCSW, LCAS _______________________ THERAPIST PROGRESS NOTE   Session Time: 10:00am - 11:00am  Location: Patient: Patient Home Provider: Clinical Home Office    Participation Level: Active    Behavioral Response: Alert, casually dressed, anxious mood/affect   Type of Therapy:  Individual Therapy   Treatment Goals addressed: Anxiety/depression management; medication management; Maintaining healthy boundaries; Church engagement   Interventions: CBT: challenging anxious thoughts   Summary: Meghan [name] is a 55 year old divorced African American female that presented for virtual appointment today with diagnoses of Major Depressive Disorder, recurrent, moderate, Generalized Anxiety Disorder and PTSD.      Suicidal/Homicidal: None; without plan or intent    Therapist Response:  Clinician met with Meghan [name] for virtual therapy session and assessed for safety, sobriety, and medication compliance.  Meghan [name]  presented for today's appointment on time and was alert, oriented x5, with no evidence or self-report of active SI/HI or A/V H.  [name] reported ongoing compliance with medications and denied any use of alcohol or illicit substances.  Clinician inquired about Meghan [name]'s emotional ratings today, as well as any significant changes in thoughts, feelings, or behavior since last check-in.  [name] reported scores of 0/10 for depression, 10/10 for anxiety, and 0/10 for anger/irritability, noting that although she did have a panic attack yesterday, stating "It was very short".  [name] reported that [facility]HIOP was "Amazing" after some initial hesitation with meeting new people.  She reported that she learned new coping skills, how to distance herself from negative people and situations, and set firmer boundaries with her children.  [name] reported that she also started on Zoloft and stated "I feel completely different.  I don't cry and don't let things get to me anymore".  [name] reported that she is anxious today because she is hosting a 'family and friends weekend' event through her church soon, and she is worried that something might go wrong and she will be blamed.  She reported that this worry was further reinforced when she attended a church meeting the other day, and a member appeared angry, and she felt this was directed at her.  Clinician utilized worry exploration handout with [name] in order to alleviate some of this anxiety by defining the worry, weighing evidence to support or dispute the likelihood of this worry happening, and strategies for handling it in a healthy manner should worst case scenario actually come to fruition.  Intervention was effective, as evidenced by [name] reporting that there is more evidence to suggest that everything will turn out  alright, since she has been planning extensively with church members to ensure that it will go smoothly, and they have a backup plan if the weather changes unexpectedly as  well.  [name] stated "My anxiety now at a 3 or a 2.  I'm definitely not as anxious, but I'll probably feel even better when I review the plan with my pastor on Thursday.  Even if things get messed up, I'll still be okay because I know people have my back and will ward off anyone trying to attack me".  Clinician will continue to monitor closely.                                        Plan: Follow up again in 1 week virtually.    Diagnosis: Major depressive disorder, recurrent, moderate; Generalized Anxiety Disorder; PTSD   [name], LCSW, LCAS 11/29/20

## 2020-12-05 ENCOUNTER — Other Ambulatory Visit: Payer: Self-pay

## 2020-12-05 ENCOUNTER — Ambulatory Visit (INDEPENDENT_AMBULATORY_CARE_PROVIDER_SITE_OTHER): Payer: Medicare Other | Admitting: Licensed Clinical Social Worker

## 2020-12-05 DIAGNOSIS — F411 Generalized anxiety disorder: Secondary | ICD-10-CM | POA: Diagnosis not present

## 2020-12-05 DIAGNOSIS — F331 Major depressive disorder, recurrent, moderate: Secondary | ICD-10-CM | POA: Diagnosis not present

## 2020-12-05 DIAGNOSIS — F4310 Post-traumatic stress disorder, unspecified: Secondary | ICD-10-CM | POA: Diagnosis not present

## 2020-12-05 NOTE — Progress Notes (Signed)
Virtual Visit via Video Note   I connected with Meghan [name] on 12/05/20 at 2:00pm by video enabled telemedicine application and verified that I am speaking with the correct person using two identifiers.   I discussed the limitations, risks, security and privacy concerns of performing an evaluation and management service by video and the availability of in person appointments. I also discussed with the patient that there may be a patient responsible charge related to this service. The patient expressed understanding and agreed to proceed.   I discussed the assessment and treatment plan with the patient. The patient was provided an opportunity to ask questions and all were answered. The patient agreed with the plan and demonstrated an understanding of the instructions.   The patient was advised to call back or seek an in-person evaluation if the symptoms worsen or if the condition fails to improve as anticipated.   I provided 1 hour of non-face-to-face time during this encounter.     [name], LCSW, LCAS _______________________ THERAPIST PROGRESS NOTE   Session Time: 2:00pm - 3:00pm  Location: Patient: Patient Home Provider: [facility] [facility]    Participation Level: Active    Behavioral Response: Alert, casually dressed, euthymic mood/affect   Type of Therapy:  Individual Therapy   Treatment Goals addressed: Anxiety/depression management; medication management; Maintaining healthy boundaries; Church engagement   Interventions: CBT, communication skills   Summary: Meghan [name] is a 55 year old divorced African American female that presented for virtual appointment today with diagnoses of Major Depressive Disorder, recurrent, moderate, Generalized Anxiety Disorder and PTSD.      Suicidal/Homicidal: None; without plan or intent    Therapist Response:  Clinician met with Meghan [name] for virtual therapy appointment and assessed for safety, sobriety, and medication compliance.  Meghan [name] presented for  today's session on time and was alert, oriented x5, with no evidence or self-report of active SI/HI or A/V H.  Meghan [name] reported that she continues taking medication responsibly and denied any use of alcohol or illicit substances.  Clinician inquired about Meghan [name]'s current emotional ratings, as well as any significant changes in thoughts, feelings, or behavior since previous check-in.  Meghan [name] reported scores of 0/10 for depression, 0/10 for anxiety, and 0/10 for anger/irritability, denying any reoccurrence of panic attacks.  Meghan [name] reported that she attended and helped organize 'family and friends day' at her church over the weekend, and everything went smoothly despite initial anxiety that things might get messed up and she would be blamed.  Meghan [name] reported that this was a two day event and although there were some no-shows on Saturday, her pastor was pleased with the outcome, and praised her for her hard work.  Meghan [name] reported that one struggle at this time is figuring out how to address issues with her daughter, stating "She's like my twin, and the smallest thing can set her off".  Clinician utilized a handout on general communication skills with Meghan [name] today to assist her with addressing problems with supports like her daughter with reduced chance of starting arguments, including tips such as choosing an emotionally calm moment, using appropriate body language/tone of voice, utilizing "I" statements to express feelings/perspective, describing the problem clearly, and ensuring respectful approach.  Intervention was effective, as evidenced by Meghan [name] reporting that this was a helpful session, as it helped prepare her for future conversations with her daughter, and highlighted some areas she needs to work on with communication, stating "I know that I don't really have a poker face, and it will say how  I feel.  I need to practice in front of a mirror and be more careful because that could set her off".  Clinician will continue  to monitor closely.                                         Plan: Follow up again in 1 week virtually.    Diagnosis: Major depressive disorder, recurrent, moderate; Generalized Anxiety Disorder; PTSD   [name], LCSW, LCAS 12/05/20

## 2020-12-12 ENCOUNTER — Ambulatory Visit (INDEPENDENT_AMBULATORY_CARE_PROVIDER_SITE_OTHER): Payer: Medicare Other | Admitting: Licensed Clinical Social Worker

## 2020-12-12 ENCOUNTER — Other Ambulatory Visit: Payer: Self-pay

## 2020-12-12 DIAGNOSIS — F331 Major depressive disorder, recurrent, moderate: Secondary | ICD-10-CM | POA: Diagnosis not present

## 2020-12-12 DIAGNOSIS — F4310 Post-traumatic stress disorder, unspecified: Secondary | ICD-10-CM | POA: Diagnosis not present

## 2020-12-12 DIAGNOSIS — F411 Generalized anxiety disorder: Secondary | ICD-10-CM

## 2020-12-12 NOTE — Progress Notes (Signed)
Virtual Visit via Video Note   I connected with Meghan Farm on 12/12/20 at 2:00pm by video enabled telemedicine application and verified that I am speaking with the correct person using two identifiers.   I discussed the limitations, risks, security and privacy concerns of performing an evaluation and management service by video and the availability of in person appointments. I also discussed with the patient that there may be a patient responsible charge related to this service. The patient expressed understanding and agreed to proceed.   I discussed the assessment and treatment plan with the patient. The patient was provided an opportunity to ask questions and all were answered. The patient agreed with the plan and demonstrated an understanding of the instructions.   The patient was advised to call back or seek an in-person evaluation if the symptoms worsen or if the condition fails to improve as anticipated.   I provided 45 minutes of non-face-to-face time during this encounter.     [name], LCSW, LCAS _______________________ THERAPIST PROGRESS NOTE   Session Time: 2:00pm - 2:45pm  Location: Patient: Patient Home Provider: [facility] [place] [facility]    Participation Level: Active    Behavioral Response: Alert, casually dressed, angry/irritable mood/affect   Type of Therapy:  Individual Therapy   Treatment Goals addressed: Anxiety/depression management; medication management; Maintaining healthy boundaries; Church engagement   Interventions: CBT, communication skills/styles, anger management skills   Summary: Meghan [name] is a 55 year old divorced African American female that presented for therapy appointment today with diagnoses of Major Depressive Disorder, recurrent, moderate, Generalized Anxiety Disorder and PTSD.      Suicidal/Homicidal: None; without plan or intent    Therapist Response:  Clinician met with Mimi for virtual therapy session and assessed for safety, sobriety, and  medication compliance.  Meghan [name] presented for today's appointment on time and was alert, oriented x5, with no evidence or self-report of active SI/HI or A/V H.  Meghan [name] reported ongoing compliance with medication and denied any use of alcohol or illicit substances.  Clinician inquired about Meghan [name]'s emotional ratings today, as well as any significant changes in thoughts, feelings, or behavior since last check-in.  Meghan [name] reported scores of 0/10 for depression, 0/10 for anxiety, 10/10 for anger/irritability, and denied experiencing any panic attacks.  Meghan [name] reported that she is feeling angrier and more irritable today due to an incident at church which occurred over the weekend when a member of the dance ministry sent out an email asking for her to join the team for a special event.  Meghan [name] reported that she has had negative history with this person, but agreed to participate for the benefit of the pastor.  Meghan [name] reported that she noticed this person looking at her in a peculiar way during church the following day, and when she confronted her about this, it did not go well, and several members of the congregation got together to intervene.  Clinician revisited discussion on communication styles (i.e. passive, assertive, and aggressive), common traits which go along with each category, and inquired about changes that could have been made in addressing this issue to reduce chance of conflict.  Clinician also discussed strategies for establishing appropriate boundaries with this person to avoid future issues during church services.  Meghan [name] acknowledged that her tone and approach could have been different, stating "My anger took over.  I asked her if there was a problem because I'm starting to say whats on my mind and not hold back, but I probably could have did it  differently".  Meghan [name] reported that she would be more mindful of how she addresses issues like this in the future and avoid interactions with triggering people like  this if possible, stating "I just need to avoid interacting with her if I can.  If there is an issue, I can ask if something is wrong politely and try to work it out, but if there's no reason for [name] to talk, Ill keep it moving".  Progress is evidenced by Meghan [name] working to improve communication skills and boundaries through therapy, in addition to reporting reduction in overall anger/irritability at closure of session, stating "I'm at a 5 now.  It definitely helps to talk it out".  Clinician will continue to monitor closely.                                         Plan: Follow up again in 1 week virtually.    Diagnosis: Major depressive disorder, recurrent, moderate; Generalized Anxiety Disorder; PTSD   [name], LCSW, LCAS 12/12/20

## 2020-12-14 ENCOUNTER — Emergency Department (HOSPITAL_COMMUNITY): Payer: Medicare Other

## 2020-12-14 ENCOUNTER — Encounter (HOSPITAL_COMMUNITY): Payer: Self-pay

## 2020-12-14 ENCOUNTER — Emergency Department (HOSPITAL_COMMUNITY)
Admission: EM | Admit: 2020-12-14 | Discharge: 2020-12-14 | Disposition: A | Discharge status: Home / Self Care | Payer: Medicare Other | Attending: Emergency Medicine | Admitting: Emergency Medicine

## 2020-12-14 ENCOUNTER — Other Ambulatory Visit: Payer: Self-pay

## 2020-12-14 DIAGNOSIS — E119 Type 2 diabetes mellitus without complications: Secondary | ICD-10-CM | POA: Insufficient documentation

## 2020-12-14 DIAGNOSIS — J45909 Unspecified asthma, uncomplicated: Secondary | ICD-10-CM | POA: Diagnosis not present

## 2020-12-14 DIAGNOSIS — W01198A Fall on same level from slipping, tripping and stumbling with subsequent striking against other object, initial encounter: Secondary | ICD-10-CM | POA: Diagnosis not present

## 2020-12-14 DIAGNOSIS — Z79899 Other long term (current) drug therapy: Secondary | ICD-10-CM | POA: Diagnosis not present

## 2020-12-14 DIAGNOSIS — Z7901 Long term (current) use of anticoagulants: Secondary | ICD-10-CM | POA: Insufficient documentation

## 2020-12-14 DIAGNOSIS — M25531 Pain in right wrist: Secondary | ICD-10-CM | POA: Insufficient documentation

## 2020-12-14 DIAGNOSIS — I509 Heart failure, unspecified: Secondary | ICD-10-CM | POA: Diagnosis not present

## 2020-12-14 DIAGNOSIS — I110 Hypertensive heart disease with heart failure: Secondary | ICD-10-CM | POA: Insufficient documentation

## 2020-12-14 DIAGNOSIS — Z7984 Long term (current) use of oral hypoglycemic drugs: Secondary | ICD-10-CM | POA: Diagnosis not present

## 2020-12-14 MED ORDER — METHOCARBAMOL 500 MG PO TABS: 500.0000 mg | ORAL_TABLET | Freq: Two times a day (BID) | ORAL | 0 refills | Status: DC

## 2020-12-14 MED ORDER — OXYCODONE-ACETAMINOPHEN 5-325 MG PO TABS
1.0000 | ORAL_TABLET | Freq: Once | ORAL | Status: AC
  Administered 2020-12-14: 1 via ORAL
  Filled 2020-12-14: qty 1

## 2020-12-14 NOTE — ED Notes (Signed)
Pt transported to xray 

## 2020-12-14 NOTE — Progress Notes (Signed)
Orthopedic Tech Progress Note Patient Details:  Meghan [name] Aug 14, 1966 [id number]  Ortho Devices Type of Ortho Device: Thumb velcro splint Ortho Device/Splint Location: RUE Ortho Device/Splint Interventions: Ordered,Application   Post Interventions Patient Tolerated: Well Instructions Provided: Care of device   Janit Pagan 12/14/2020, 2:41 PM

## 2020-12-14 NOTE — ED Provider Notes (Signed)
[facility] EMERGENCY DEPARTMENT Provider Note   CSN: [id number] Arrival date & time: 12/14/20  1148     History Chief Complaint  Patient presents with  . Wrist Pain    Meghan [name] is a 55 y.o. female.  HPI Patient is a 55 year old female presented today after she fell onto her right wrist at approximately 10 AM this morning.  She states she slipped on her shag rug and fell forwards towards her table.  She states that she caught herself with her right hand while falling and prevent herself from hitting her head on the table however she states that she did collided the table with her shoulder and neck on her way to the ground.  She states she caught herself on the ground with her right hand.  She states that she has had right wrist pain since then.  She denies any head injury or loss of consciousness no head impact at all.  She states that her neck is not particularly painful.  She states that her wrist is significantly achy with 8/10 pain.  She denies any other symptoms.  She states that this injury was approximately 4 hours ago at the time of this evaluation she has had no nausea, vomiting, vision changes, lightheadedness or dizziness.  She is on DOAC blood thinner for A. fib.  She states she is compliant with her medications.     Past Medical History:  Diagnosis Date  . Arthritis    left knee  . Asthma   . Atrial fibrillation ([place])   . Atrial fibrillation ([place]) 02/16/2019  . CHF (congestive heart failure) ([place]) 01/2019   Physician from [place] informed her she had this   . Depression   . Dysrhythmia    Atrial fibrillation  . GERD (gastroesophageal reflux disease)   . Hiatal hernia   . History of pulmonary embolus (PE)   . Hypercholesterolemia   . Macular degeneration    of left eye  . Osteoarthritis    knees  . Pneumonia    in the past  . Pre-diabetes   . Pulmonary embolism ([place])    bilat  . Seizure ([place])   . Seizure disorder ([place]) 2017  . Sleep apnea  CPAP   [facility]  . Stroke [facility])    1989 and 1995 (left sided weakness)  . Vitamin D deficiency     Patient Active Problem List   Diagnosis Date Noted  . Intractable nausea and vomiting 11/16/2020  . Ileus ([place]) 11/14/2020  . Fecal impaction ([place]) 11/14/2020  . Left-sided weakness 09/29/2020  . Asthma 09/29/2020  . Pre-diabetes 09/29/2020  . Pain in left finger(s) 07/12/2020  . Seizure-like activity ([place]) 03/25/2020  . History of head injury 03/25/2020  . Obstructive sleep apnea syndrome 09/04/2019  . Acute medial meniscus tear of right knee 07/10/2019  . Class 3 severe obesity due to excess calories with serious comorbidity and body mass index (BMI) of 50.0 to 59.9 in adult ([place]) 06/01/2019  . Atrial fibrillation ([place]) 04/17/2019  . History of pulmonary embolus (PE) 04/17/2019  . Type 2 diabetes mellitus, without long-term current use of insulin ([place]) 04/17/2019    Past Surgical History:  Procedure Laterality Date  . ABDOMINAL HYSTERECTOMY  2003   complete  . CARDIAC CATHETERIZATION  2017   in [facility] in [place]  . CESAREAN SECTION  [id number]   x 2  . CESAREAN SECTION     x 2  . CHOLECYSTECTOMY  2003  .  COLONOSCOPY WITH PROPOFOL N/A 06/05/2019   Procedure: COLONOSCOPY WITH PROPOFOL;  Surgeon: Carol Ada, MD;  Location: [facility];  Service: Endoscopy;  Laterality: N/A;  . CYST REMOVAL WITH BONE GRAFT Left 10/18/2020   Procedure: BONE GRAFTING OF ENCHONDROMA MIDDLE [place] OF LEFT MIDDLE FINGER;  Surgeon: Daryll Brod, MD;  Location: [facility];  Service: Orthopedics;  Laterality: Left;  AXILLARY BLOCK  . DIAGNOSTIC LAPAROSCOPY  2015; 2017   lap hernia repair x2  . HERNIA REPAIR  8657, [id number]   umbilical hernia repair  . IVC FILTER INSERTION  2003   Hx pulmonary embolus  . POLYPECTOMY  06/05/2019   Procedure: POLYPECTOMY;  Surgeon: Carol Ada, MD;  Location: [facility];  Service: Endoscopy;;  . SHOULDER ARTHROSCOPY W/ ROTATOR CUFF REPAIR  08/28/2017    right shoulder  . WRIST ARTHROSCOPY WITH DEBRIDEMENT Left 10/18/2020   Procedure: LEFT WRIST ARTHROSCOPY WITH DEBRIDEMENT;  Surgeon: Daryll Brod, MD;  Location: [facility];  Service: Orthopedics;  Laterality: Left;  AXILLARY BLOCK     OB History   No obstetric history on file.     Family History  Problem Relation Age of Onset  . Breast cancer Mother   . Breast cancer Maternal Grandmother   . Heart attack Father   . Diabetes Father   . Congestive Heart Failure Father     Social History   Tobacco Use  . Smoking status: Never Smoker  . Smokeless tobacco: Never Used  Vaping Use  . Vaping Use: Never used  Substance Use Topics  . Alcohol use: Never  . Drug use: Never    Home Medications Prior to Admission medications   Medication Sig Start Date End Date Taking? Authorizing Provider  methocarbamol (ROBAXIN) 500 MG tablet Take 1 tablet (500 mg total) by mouth 2 (two) times daily. 12/14/20  Yes Infant Zink, Ova [place] S, PA  acetaminophen (TYLENOL) 500 MG tablet Take 1,000 mg by mouth every 6 (six) hours as needed for moderate pain.    [provider]  albuterol (VENTOLIN HFA) 108 (90 Base) MCG/ACT inhaler Inhale 2 puffs into the lungs as needed for wheezing or shortness of breath.    [provider]  BREZTRI AEROSPHERE 160-9-4.8 MCG/ACT AERO Inhale 2 puffs into the lungs in the morning and at bedtime.  03/24/20   [provider]  Cholecalciferol (VITAMIN D-3) 25 MCG (1000 UT) CAPS Take 1,000 Units by mouth daily.    [provider]  cycloSPORINE (RESTASIS) 0.05 % ophthalmic emulsion Place 1 drop into both eyes 2 (two) times daily.     [provider]  flecainide (TAMBOCOR) 50 MG tablet Take 50 mg by mouth 2 (two) times daily.  09/04/19   [provider]  furosemide (LASIX) 40 MG tablet Take 40 mg by mouth daily with breakfast.     [provider]  levETIRAcetam (KEPPRA) 500 MG tablet Take 1 tablet (500 mg total) by mouth 2 (two) times  daily. 12/15/20   Marcial Pacas, MD  metFORMIN (GLUCOPHAGE) 500 MG tablet Take 500 mg by mouth daily with breakfast.    [provider]  Multiple Vitamins-Minerals (PRESERVISION AREDS 2 PO) Take 2 capsules by mouth daily with breakfast.     [provider]  Omega-3 Fatty Acids (FISH OIL) 1000 MG CAPS Take 1,000 mg by mouth daily with breakfast.     [provider]  omeprazole (PRILOSEC) 40 MG capsule Take 40 mg by mouth daily. 08/17/20   [provider]  OZEMPIC, 1 MG/DOSE, 4  MG/3ML SOPN Inject 1 mg into the skin once a week. Sundays 10/24/20   [provider]  polyethylene glycol (MIRALAX / GLYCOLAX) 17 g packet Take 17 g by mouth 2 (two) times daily. 11/17/20 01/16/21  Johnson, Clanford L, MD  promethazine (PHENERGAN) 12.5 MG tablet Take 12.5 mg by mouth at bedtime. 09/28/20   [provider]  rivaroxaban (XARELTO) 20 MG TABS tablet Take 20 mg by mouth at bedtime.     [provider]    Allergies    Acetaminophen-codeine, Other, Tramadol, Coconut oil, Meloxicam, and Tomato  Review of Systems   Review of Systems  Constitutional: Negative for fever.  HENT: Negative for congestion.   Respiratory: Negative for shortness of breath.   Cardiovascular: Negative for chest pain.  Gastrointestinal: Negative for abdominal distention.  Musculoskeletal:       Right wrist pain  Neurological: Negative for dizziness and headaches.    Physical Exam Updated Vital Signs BP 125/61 (BP Location: Right Arm)   Pulse 70   Temp 98.3 F (36.8 C) (Oral)   Resp 18   SpO2 96%   Physical Exam Vitals and nursing note reviewed.  Constitutional:      General: She is not in acute distress.    Appearance: Normal appearance. She is not ill-appearing.  HENT:     Head: Normocephalic and atraumatic.  Eyes:     General: No scleral icterus.       Right eye: No discharge.        Left eye: No discharge.     Conjunctiva/sclera: Conjunctivae normal.  Neck:      Comments: Neck is supple nontender no abrasions or contusions to neck Pulmonary:     Effort: Pulmonary effort is normal.     Breath sounds: No stridor.  Abdominal:     Tenderness: There is no abdominal tenderness.  Musculoskeletal:     Comments: No C, T, L-spine tenderness to palpation  Diffuse tenderness of the wrist of the right hand. There is some focal scaphoid tenderness in the snuffbox of the right wrist.  Neurological:     Mental Status: She is alert and oriented to person, place, and time. Mental status is at baseline.     Comments: Moves all 4 extremities, sensation tact in all 4 extremities in fingertips and toes.  Psychiatric:        Mood and Affect: Mood normal.        Behavior: Behavior normal.     ED Results / Procedures / Treatments   Labs (all labs ordered are listed, but only abnormal results are displayed) Labs Reviewed - No data to display  EKG None  Radiology DG Wrist Complete Right  Result Date: 12/14/2020 CLINICAL DATA:  Fall. EXAM: RIGHT HAND - COMPLETE 3+ VIEW; RIGHT WRIST - COMPLETE 3+ VIEW COMPARISON:  Right wrist x-rays dated November 14, 2020. FINDINGS: There is no evidence of fracture or dislocation. There is no evidence of arthropathy or other focal bone abnormality. Soft tissues are unremarkable. IMPRESSION: 1. Negative right hand and wrist x-rays. Electronically Signed   By: Titus Dubin M.D.   On: 12/14/2020 13:30   DG Hand Complete Right  Result Date: 12/14/2020 CLINICAL DATA:  Fall. EXAM: RIGHT HAND - COMPLETE 3+ VIEW; RIGHT WRIST - COMPLETE 3+ VIEW COMPARISON:  Right wrist x-rays dated November 14, 2020. FINDINGS: There is no evidence of fracture or dislocation. There is no evidence of arthropathy or other focal bone abnormality. Soft tissues are unremarkable. IMPRESSION: 1.  Negative right hand and wrist x-rays. Electronically Signed   By: Titus Dubin M.D.   On: 12/14/2020 13:30    Procedures Procedures   Medications Ordered in  ED Medications  oxyCODONE-acetaminophen (PERCOCET/ROXICET) 5-325 MG per tablet 1 tablet (1 tablet Oral Given 12/14/20 1254)    ED Course  I have reviewed the triage vital signs and the nursing notes.  Pertinent labs & imaging results that were available during my care of the patient were reviewed by me and considered in my medical decision making (see chart for details).    MDM Rules/Calculators/A&P                           Patient with fall onto right wrist.  She is on DOAC but did not hit her head or lose consciousness and has had no nausea vomiting or headaches.  NAF - right wrist and hand.   Distally NVI   Some snuffbox TTP.  Will place in thumb spica.   She will follow-up with PCP.  She is already scheduled to follow-up with her hand surgeon in the next 4 to 5 days and she can have her wrist rechecked then and potentially scheduled for x-ray to rule out scaphoid fracture.  Final Clinical Impression(s) / ED Diagnoses Final diagnoses:  Right wrist pain    Rx / DC Orders ED Discharge Orders         Ordered    methocarbamol (ROBAXIN) 500 MG tablet  2 times daily        12/14/20 1348           Pati Gallo [place], [place] 12/15/20 1529    Elnora Morrison, MD 12/17/20 1149

## 2020-12-14 NOTE — ED Triage Notes (Signed)
Pt BIB EMS from home due to fall around 10am . Pt reports she slipped on a rug and fell. Pt is a&ox4 . Pt is c/o right wrist pain. Pt denies any LOC.

## 2020-12-14 NOTE — Discharge Instructions (Addendum)
Please rest ice and elevate your wrist.  Please follow-up with your hand doctor because you have tenderness over the scaphoid bone you will need repeat x-rays.  Please use the wrist until then.  You may use Tylenol 1000 mg every 6 hours as needed for pain.  I have also prescribed you a muscle relaxer, Robaxin for muscle pain that he may have as result of the fall and also for pain control since you are unable to take any NSAID medication on Xarelto.  Given that you did not hit your head during this fall or lose consciousness I am reassured that you do not have a brain bleed.  If you start experiencing worsening symptoms such as vomiting, severe headache with weakness or numbness or light sensitivity come back to the ER for reevaluation.

## 2020-12-15 ENCOUNTER — Ambulatory Visit (INDEPENDENT_AMBULATORY_CARE_PROVIDER_SITE_OTHER): Payer: Medicare Other | Admitting: Neurology

## 2020-12-15 ENCOUNTER — Encounter: Payer: Self-pay | Admitting: Neurology

## 2020-12-15 VITALS — BP 107/68 | HR 67 | Ht 65.0 in | Wt 308.5 lb

## 2020-12-15 DIAGNOSIS — R569 Unspecified convulsions: Secondary | ICD-10-CM

## 2020-12-15 MED ORDER — LEVETIRACETAM 500 MG PO TABS: 500.0000 mg | ORAL_TABLET | Freq: Two times a day (BID) | ORAL | 3 refills | Status: DC
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
## 2020-12-18 ENCOUNTER — Other Ambulatory Visit (HOSPITAL_COMMUNITY): Payer: Self-pay | Admitting: Family

## 2020-12-19 ENCOUNTER — Other Ambulatory Visit: Payer: Self-pay

## 2020-12-19 ENCOUNTER — Ambulatory Visit (INDEPENDENT_AMBULATORY_CARE_PROVIDER_SITE_OTHER): Payer: Medicare Other | Admitting: Licensed Clinical Social Worker

## 2020-12-19 DIAGNOSIS — F331 Major depressive disorder, recurrent, moderate: Secondary | ICD-10-CM | POA: Diagnosis not present

## 2020-12-19 DIAGNOSIS — F411 Generalized anxiety disorder: Secondary | ICD-10-CM

## 2020-12-19 DIAGNOSIS — F4310 Post-traumatic stress disorder, unspecified: Secondary | ICD-10-CM

## 2020-12-19 NOTE — Progress Notes (Signed)
Virtual Visit via Video Note   I connected with Meghan [name] on 12/19/20 at 2:00pm by video enabled telemedicine application and verified that I am speaking with the correct person using two identifiers.   I discussed the limitations, risks, security and privacy concerns of performing an evaluation and management service by video and the availability of in person appointments. I also discussed with the patient that there may be a patient responsible charge related to this service. The patient expressed understanding and agreed to proceed.   I discussed the assessment and treatment plan with the patient. The patient was provided an opportunity to ask questions and all were answered. The patient agreed with the plan and demonstrated an understanding of the instructions.   The patient was advised to call back or seek an in-person evaluation if the symptoms worsen or if the condition fails to improve as anticipated.   I provided 1 hour of non-face-to-face time during this encounter.     Meghan Flood, LCSW, LCAS _______________________ THERAPIST PROGRESS NOTE   Session Time: 2:00pm - 3:00pm  Location: Patient: Patient Home Provider: [facility] [place] Office    Participation Level: Active    Behavioral Response: Alert, casually dressed, angry/irritable mood/affect   Type of Therapy:  Individual Therapy   Treatment Goals addressed: Anxiety/depression management; medication management; Maintaining healthy boundaries; Church engagement   Interventions: CBT: cost benefit analysis; Maintaining healthy boundaries   Summary: Meghan [name] is a 55 year old divorced African American female that presented for therapy appointment today with diagnoses of Major Depressive Disorder, recurrent, moderate, Generalized Anxiety Disorder and PTSD.      Suicidal/Homicidal: None; without plan or intent    Therapist Response:  Clinician met with Meghan [name] for virtual therapy appointment and assessed for safety, sobriety, and  medication compliance.  Meghan [name] presented for today's session on time and was alert, oriented x5, with no evidence or self-report of active SI/HI or A/V H.  Meghan [name] reported that she remains compliant with medication and denied any use of alcohol or illicit substances.  Clinician inquired about Meghan [name]'s current emotional ratings, as well as any significant changes in thoughts, feelings, or behavior since previous check-in.  Meghan [name] reported scores of 0/10 for depression, 6/10 for anxiety, 10/10 for anger/irritability, and denied experiencing any panic attacks.  Meghan [name] reported that she has been frustrated with church, stating "I don't like being there anymore, I'm thinking about switching to somewhere else".  She reported that this is due to ongoing issues with a handful of people who directly influence her emotions in a negative way.  Clinician assisted Meghan [name] in running cost benefit analysis regarding decision of whether or not to remain with this church, or seek admission to a more supportive, positive one nearby, in addition to exploring alternative church options around her apartment based upon available web search results.  Clinician also explored possibility of Meghan [name] taking more rigid approach to boundaries with these individuals in order to avoid having to fully transition out of church, such as blocking certain individuals' numbers, or only participating in church events they are absent from.  Interventions were effective, as evidenced by Meghan [name] reporting that this session helped her further process decision to leave church in a rational, calm way, stating "I'm not going to make any snap decisions. I will do some thinking and praying, and visit this one new place on Sunday after easter to see what they've got going on".  She reported that in the meantime she will continue to keep her distance  from triggering individuals and practice grounding techniques when they upset her.  Clinician will continue to monitor  closely.                                         Plan: Follow up again in 1 week virtually.    Diagnosis: Major depressive disorder, recurrent, moderate; Generalized Anxiety Disorder; and PTSD   Meghan Flood, LCSW, LCAS 12/19/20

## 2020-12-26 ENCOUNTER — Ambulatory Visit (HOSPITAL_COMMUNITY): Payer: Medicare Other | Admitting: Licensed Clinical Social Worker

## 2020-12-26 ENCOUNTER — Telehealth (HOSPITAL_COMMUNITY): Payer: Self-pay | Admitting: Licensed Clinical Social Worker

## 2020-12-26 ENCOUNTER — Other Ambulatory Visit: Payer: Self-pay

## 2020-12-26 NOTE — Telephone Encounter (Signed)
Meghan [name] had a virtual appointment scheduled today at 3pm.  Clinician contacted her by phone at 3:07pm when she did not respond to email and text reminders.  [name] did not answer this phone call, so clinician left a voicemail reminding her of appointment, and included contact numbers for office and front desk.  Clinician ended virtual meeting at 3:15pm when [name] had not appeared for session or returned call, and informed front desk of no-show event.   [name], [place], LCAS 12/26/20

## 2020-12-27 ENCOUNTER — Ambulatory Visit (INDEPENDENT_AMBULATORY_CARE_PROVIDER_SITE_OTHER): Payer: Medicare Other | Admitting: Psychiatry

## 2020-12-27 ENCOUNTER — Encounter (HOSPITAL_COMMUNITY): Payer: Self-pay | Admitting: Psychiatry

## 2020-12-27 ENCOUNTER — Other Ambulatory Visit: Payer: Self-pay

## 2020-12-27 DIAGNOSIS — F411 Generalized anxiety disorder: Secondary | ICD-10-CM | POA: Diagnosis not present

## 2020-12-27 DIAGNOSIS — F331 Major depressive disorder, recurrent, moderate: Secondary | ICD-10-CM | POA: Diagnosis not present

## 2020-12-27 NOTE — Progress Notes (Signed)
Virtual Visit via Video Note  I connected with [name] on 12/27/20 at 12:00 PM EDT by a video enabled telemedicine application and verified that I am speaking with the correct person using two identifiers.  I discussed the limitations of evaluation and management by telemedicine and the availability of in person appointments. The patient expressed understanding and agreed to proceed.    Location: Patient: Patient Home Provider: Home Office   GROUP GOAL: Client will attend IOP Aftercare Group Therapy 2-4x a month to connect with peers and to apply strategies discussed to improve mental health condition.  History of Present Illness: MDD  Observations/Objective: Counselor met with Patient in the context of Group Therapy, via Webex. Counselor prompted Patient to share updates on coping skill application and individual therapeutic process in management of mental health. Client reports intentional efforts to maintain mental health.  Counselor prompted Patient to share areas of concern, challenges and identify barriers to meeting current goals. Topics covered: preparing for stressful events, challenging self to change habits, future planning, communicating needs, and medication compliance. Patient engaged in discussion, provided feedback for others within the group and took note of additional strategies reviewed and discussed.   Assessment and Plan: Counselor recommends client continue following treatment plan goals, following crisis plan and following up with behavioral health and medical providers as needed. Patient is welcome to join group at next session.   Follow Up Instructions: Counselor to provide link for next session via Webex platform. The patient was advised to call back or seek an in-person evaluation if the symptoms worsen or if the condition fails to improve as anticipated.  I provided 80 minutes of non-face-to-face time during this encounter.   Lise Auer, LCSW

## 2020-12-28 ENCOUNTER — Ambulatory Visit (INDEPENDENT_AMBULATORY_CARE_PROVIDER_SITE_OTHER): Payer: Medicare Other | Admitting: Licensed Clinical Social Worker

## 2020-12-28 ENCOUNTER — Other Ambulatory Visit: Payer: Self-pay

## 2020-12-28 DIAGNOSIS — F331 Major depressive disorder, recurrent, moderate: Secondary | ICD-10-CM

## 2020-12-28 DIAGNOSIS — F4310 Post-traumatic stress disorder, unspecified: Secondary | ICD-10-CM

## 2020-12-28 DIAGNOSIS — F411 Generalized anxiety disorder: Secondary | ICD-10-CM

## 2020-12-28 NOTE — Progress Notes (Signed)
Virtual Visit via Video Note   I connected with [name] on 12/28/20 at 10:00am by video enabled telemedicine application and verified that I am speaking with the correct person using two identifiers.   I discussed the limitations, risks, security and privacy concerns of performing an evaluation and management service by video and the availability of in person appointments. I also discussed with the patient that there may be a patient responsible charge related to this service. The patient expressed understanding and agreed to proceed.   I discussed the assessment and treatment plan with the patient. The patient was provided an opportunity to ask questions and all were answered. The patient agreed with the plan and demonstrated an understanding of the instructions.   The patient was advised to call back or seek an in-person evaluation if the symptoms worsen or if the condition fails to improve as anticipated.   I provided 1 hour of non-face-to-face time during this encounter.     [name], LCSW, LCAS _______________________ THERAPIST PROGRESS NOTE   Session Time: 10:00am - 11:00am  Location: Patient: Patient Home Provider: [facility] [place] Office    Participation Level: Active    Behavioral Response: Alert, casually dressed, irritable mood/affect   Type of Therapy:  Individual Therapy   Treatment Goals addressed: Anxiety/depression management; medication management; Maintaining healthy boundaries   Interventions: CBT, managing healthy boundaries, guided imagery   Summary: Blaize Nipper is a 55 year old divorced African American female that presented for therapy appointment today with diagnoses of Major Depressive Disorder, recurrent, moderate, Generalized Anxiety Disorder and PTSD.      Suicidal/Homicidal: None; without plan or intent    Therapist Response:  Clinician met with Rinnah for virtual therapy session and assessed for safety, sobriety, and medication compliance.  Khamora presented  for today's appointment on time and was alert, oriented x5, with no evidence or self-report of active SI/HI or A/V H.  Shalea reported ongoing compliance with medication and denied any use of alcohol or illicit substances.  Clinician inquired about Aralyn's emotional ratings today, as well as any significant changes in thoughts, feelings, or behavior since last check-in.  Ranika reported scores of 0/10 for depression, 0/10 for anxiety, 10/10 for irritability, and denied experiencing any panic attacks.  Georgean reported that her irritability today is centered upon a disagreement between her daughters recently.  She stated "One of them isn't treating the other sister right.  She is very selfish and only thinks about herself.  I told my other daughter that she had to handle it herself though.  I don't get involved anymore.  I just let them handle it on their own and I'm not as irritated as I would have been a year ago".  Clinician praised Halei for maintaining boundaries appropriately and encouraged her to stay mindful of how interactions with triggering family members can negatively affect her mental health based upon past issues that have arisen.  Clinician also explored strategies with Frederick today for reducing overall irritability level, including a guided imagery activity involving visualizing a peaceful garden with pleasant sensory details, such as flowers, running water, sunshine, and a bench to sit on.  Ninnie was agreeable to engaging in this activity and reported success in reducing irritability and improving outlook afterward, stating "I do feel better.  I'm not even thinking about my daughters anymore.  I was able to imagine a little brook, some flowers, and crickets chirping".  Clinician encouraged Juliannah to practice this activity as part of her self-care  based upon positive results, and will continue to monitor closely.                                         Plan: Follow up again in 1 week virtually.     Diagnosis: Major depressive disorder, recurrent, moderate; Generalized Anxiety Disorder; and PTSD   [name], LCSW, LCAS 12/28/20

## 2021-01-02 ENCOUNTER — Other Ambulatory Visit: Payer: Self-pay

## 2021-01-02 ENCOUNTER — Ambulatory Visit (INDEPENDENT_AMBULATORY_CARE_PROVIDER_SITE_OTHER): Payer: Medicare Other | Admitting: Licensed Clinical Social Worker

## 2021-01-02 DIAGNOSIS — F411 Generalized anxiety disorder: Secondary | ICD-10-CM | POA: Diagnosis not present

## 2021-01-02 DIAGNOSIS — F331 Major depressive disorder, recurrent, moderate: Secondary | ICD-10-CM

## 2021-01-02 DIAGNOSIS — F4310 Post-traumatic stress disorder, unspecified: Secondary | ICD-10-CM | POA: Diagnosis not present

## 2021-01-02 NOTE — Progress Notes (Signed)
Virtual Visit via Video Note   I connected with Meghan [name] on 01/02/21 at 2:00pm by video enabled telemedicine application and verified that I am speaking with the correct person using two identifiers.   I discussed the limitations, risks, security and privacy concerns of performing an evaluation and management service by video and the availability of in person appointments. I also discussed with the patient that there may be a patient responsible charge related to this service. The patient expressed understanding and agreed to proceed.   I discussed the assessment and treatment plan with the patient. The patient was provided an opportunity to ask questions and all were answered. The patient agreed with the plan and demonstrated an understanding of the instructions.   The patient was advised to call back or seek an in-person evaluation if the symptoms worsen or if the condition fails to improve as anticipated.   I provided 40 minutes of non-face-to-face time during this encounter.     [name], LCSW, LCAS _______________________ THERAPIST PROGRESS NOTE   Session Time: 2:00pm - 2:40pm   Location: Patient: Patient Home Provider: [facility]    Participation Level: Active    Behavioral Response: Alert, casually dressed, depressed mood/affect   Type of Therapy:  Individual Therapy   Treatment Goals addressed: Anxiety/depression management; medication management; Maintaining healthy boundaries   Interventions: CBT, problem solving, healthy boundaries    Summary: Meghan [name] is a 55 year old divorced African American female that presented for therapy appointment today with diagnoses of Major Depressive Disorder, recurrent, moderate, Generalized Anxiety Disorder and PTSD.      Suicidal/Homicidal: None; without plan or intent    Therapist Response:  Clinician met with Meghan [name] for virtual therapy appointment and assessed for safety, sobriety, and medication compliance.  Meghan [name] presented  for today's session on time and was alert, oriented x5, with no evidence or self-report of active SI/HI or A/V H.  Meghan [name] reported that Meghan [name] continues taking medication responsibly and denied any use of alcohol or illicit substances.  Clinician inquired about Meghan [name]'s current emotional ratings, as well as any significant changes in thoughts, feelings, or behavior since previous check-in.  Meghan [name] reported scores of 10/10 for depression, 0/10 for anxiety, 10/10 for anger/irritability, but denied experiencing any panic attacks.  Jasline reported that her irritability and depression today is centered upon interactions with specific peers at church, stating "I'm at the point where I just don't want anything to do with my church anymore".  Clinician revisited discussion on possibility of seeking a new church that might offer more supportive congregation with less exposure to triggering individuals.  Meghan [name] reported that Meghan [name] has outreached a church nearby and has considered going to a sermon on Sunday, but there is a picnic scheduled with current church on Saturday Meghan [name] has been invited to.  Meghan [name] reported that Meghan [name] is uncertain about whether to attend the picnic despite encouragement from friends.  Clinician assisted Meghan [name] in weighing costs versus benefits of attending the picnic this Saturday based upon possibility of exposure to previously mentioned triggering members.  Clinician also encouraged Meghan [name] to consider modifying self-care routine to include activities which offer healthy outlet for feelings of anger/irritability based upon previous pattern of bottling up difficult emotions.  Meghan [name] reported that the positives outweigh the negatives at this time, so Meghan [name] will plan to go to the picnic as long as Meghan [name] isn't still at over 5/10 in anger severity that day.  Meghan [name] reported that Meghan [name] would also have friends and family  accompanying her to serve as a buffer should Meghan [name] encounter people Meghan [name] doesn't get along with.  Interventions were  effective, as evidenced by Meghan [name] reporting that it helped to talk about these stressors with a rational perspective and develop a plan so Meghan [name] can still have a good time at this event while maintaining boundaries, stating "I'm going to keep my distance from her and hold my tongue if Meghan [name] tries to talk".  Meghan [name] also reported that Meghan [name] would benefit from engaging in physical hobbies again, like martial arts, or bowling, so Meghan [name] will consider getting involved in a dojo or league nearby in upcoming weeks, stating "It used to feel really good to hit a punching bag and I'd like to earn my next degree belt".  Clinician will continue to monitor closely.                                         Plan: Follow up again in 1 week virtually.    Diagnosis: Major depressive disorder, recurrent, moderate; Generalized Anxiety Disorder; and PTSD   [name], LCSW, LCAS 01/02/21

## 2021-01-09 ENCOUNTER — Other Ambulatory Visit: Payer: Self-pay

## 2021-01-09 ENCOUNTER — Ambulatory Visit (INDEPENDENT_AMBULATORY_CARE_PROVIDER_SITE_OTHER): Payer: Medicare Other | Admitting: Licensed Clinical Social Worker

## 2021-01-09 DIAGNOSIS — F411 Generalized anxiety disorder: Secondary | ICD-10-CM

## 2021-01-09 DIAGNOSIS — F331 Major depressive disorder, recurrent, moderate: Secondary | ICD-10-CM

## 2021-01-09 DIAGNOSIS — F4310 Post-traumatic stress disorder, unspecified: Secondary | ICD-10-CM
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
## 2021-01-10 ENCOUNTER — Ambulatory Visit (INDEPENDENT_AMBULATORY_CARE_PROVIDER_SITE_OTHER): Payer: Medicare Other

## 2021-01-10 ENCOUNTER — Other Ambulatory Visit: Payer: Self-pay

## 2021-01-10 ENCOUNTER — Telehealth: Payer: Self-pay | Admitting: *Deleted

## 2021-01-10 ENCOUNTER — Ambulatory Visit (HOSPITAL_COMMUNITY): Payer: Medicare Other | Admitting: Psychiatry

## 2021-01-10 DIAGNOSIS — F331 Major depressive disorder, recurrent, moderate: Secondary | ICD-10-CM

## 2021-01-10 DIAGNOSIS — R569 Unspecified convulsions: Secondary | ICD-10-CM

## 2021-01-10 NOTE — Telephone Encounter (Signed)
The patient is sitting in a recliner having a snack with water. Her vitals were rechecked sitting (119/67, 60) then standing (124/77, 70). Her dizziness has now passed. She was assisted to the lobby where her transportation will pick her up.

## 2021-01-10 NOTE — Telephone Encounter (Signed)
The patient started feeling dizzy following her EEG today. Seeing floaters, right ear feels like it is "under water and needs to pop". Hx of diabetes, CHF, A-fib, seizure-like activity, OSA. Feels this is different from past episodes of vertigo. Denies any shortness of breath, feeling of heart palpitations, recent infection. Says she is drinking plenty of water daily. BP: 113/64, P: 65. She last ate breakfast (boiled egg, fruit) at 7:30am. Blood glucose: 95. Denies any missed doses of medications.

## 2021-01-11 ENCOUNTER — Emergency Department (HOSPITAL_COMMUNITY): Payer: Medicare Other

## 2021-01-11 ENCOUNTER — Emergency Department (HOSPITAL_COMMUNITY)
Admission: EM | Admit: 2021-01-11 | Discharge: 2021-01-11 | Disposition: A | Discharge status: Home / Self Care | Payer: Medicare Other | Attending: Emergency Medicine | Admitting: Emergency Medicine

## 2021-01-11 ENCOUNTER — Encounter (HOSPITAL_COMMUNITY): Payer: Self-pay | Admitting: Student

## 2021-01-11 ENCOUNTER — Encounter (HOSPITAL_COMMUNITY): Payer: Self-pay | Admitting: Psychiatry

## 2021-01-11 ENCOUNTER — Other Ambulatory Visit: Payer: Self-pay

## 2021-01-11 DIAGNOSIS — J45909 Unspecified asthma, uncomplicated: Secondary | ICD-10-CM | POA: Insufficient documentation

## 2021-01-11 DIAGNOSIS — Z7984 Long term (current) use of oral hypoglycemic drugs: Secondary | ICD-10-CM | POA: Diagnosis not present

## 2021-01-11 DIAGNOSIS — R42 Dizziness and giddiness: Secondary | ICD-10-CM | POA: Diagnosis not present

## 2021-01-11 DIAGNOSIS — R109 Unspecified abdominal pain: Secondary | ICD-10-CM | POA: Insufficient documentation

## 2021-01-11 DIAGNOSIS — I509 Heart failure, unspecified: Secondary | ICD-10-CM | POA: Insufficient documentation

## 2021-01-11 DIAGNOSIS — R531 Weakness: Secondary | ICD-10-CM | POA: Diagnosis not present

## 2021-01-11 DIAGNOSIS — Z20822 Contact with and (suspected) exposure to covid-19: Secondary | ICD-10-CM | POA: Insufficient documentation

## 2021-01-11 DIAGNOSIS — I110 Hypertensive heart disease with heart failure: Secondary | ICD-10-CM | POA: Diagnosis not present

## 2021-01-11 DIAGNOSIS — E119 Type 2 diabetes mellitus without complications: Secondary | ICD-10-CM | POA: Insufficient documentation

## 2021-01-11 DIAGNOSIS — Z79899 Other long term (current) drug therapy: Secondary | ICD-10-CM | POA: Insufficient documentation

## 2021-01-11 LAB — CBC WITH DIFFERENTIAL/PLATELET
Abs Immature Granulocytes: 0.01 10*3/uL (ref 0.00–0.07)
Basophils Absolute: 0 10*3/uL (ref 0.0–0.1)
Basophils Relative: 1 %
Eosinophils Absolute: 0.1 10*3/uL (ref 0.0–0.5)
Eosinophils Relative: 2 %
HCT: 36.3 % (ref 36.0–46.0)
Hemoglobin: 11.5 g/dL — ABNORMAL LOW (ref 12.0–15.0)
Immature Granulocytes: 0 %
Lymphocytes Relative: 30 %
Lymphs Abs: 1.6 10*3/uL (ref 0.7–4.0)
MCH: 26.4 pg (ref 26.0–34.0)
MCHC: 31.7 g/dL (ref 30.0–36.0)
MCV: 83.4 fL (ref 80.0–100.0)
Monocytes Absolute: 0.4 10*3/uL (ref 0.1–1.0)
Monocytes Relative: 8 %
Neutro Abs: 3.3 10*3/uL (ref 1.7–7.7)
Neutrophils Relative %: 59 %
Platelets: 270 10*3/uL (ref 150–400)
RBC: 4.35 MIL/uL (ref 3.87–5.11)
RDW: 15.1 % (ref 11.5–15.5)
WBC: 5.4 10*3/uL (ref 4.0–10.5)
nRBC: 0 % (ref 0.0–0.2)

## 2021-01-11 LAB — COMPREHENSIVE METABOLIC PANEL
ALT: 13 U/L (ref 0–44)
AST: 14 U/L — ABNORMAL LOW (ref 15–41)
Albumin: 3.7 g/dL (ref 3.5–5.0)
Alkaline Phosphatase: 199 U/L — ABNORMAL HIGH (ref 38–126)
Anion gap: 7 (ref 5–15)
BUN: 15 mg/dL (ref 6–20)
CO2: 29 mmol/L (ref 22–32)
Calcium: 10.3 mg/dL (ref 8.9–10.3)
Chloride: 107 mmol/L (ref 98–111)
Creatinine, Ser: 0.58 mg/dL (ref 0.44–1.00)
GFR, Estimated: >60 mL/min (ref >60)
Glucose, Bld: 93 mg/dL (ref 70–99)
Potassium: 4.2 mmol/L (ref 3.5–5.1)
Sodium: 143 mmol/L (ref 135–145)
Total Bilirubin: 0.2 mg/dL — ABNORMAL LOW (ref 0.3–1.2)
Total Protein: 7.6 g/dL (ref 6.5–8.1)

## 2021-01-11 LAB — LIPASE, BLOOD: Lipase: 42 U/L (ref 11–51)

## 2021-01-11 LAB — TROPONIN I (HIGH SENSITIVITY)
Troponin I (High Sensitivity): 4 ng/L (ref <18)
Troponin I (High Sensitivity): 3 ng/L (ref <18)

## 2021-01-11 LAB — RESP PANEL BY RT-PCR (FLU A&B, COVID) ARPGX2
Influenza A by PCR: NEGATIVE
Influenza B by PCR: NEGATIVE
SARS Coronavirus 2 by RT PCR: NEGATIVE

## 2021-01-11 LAB — URINALYSIS, ROUTINE W REFLEX MICROSCOPIC
Bilirubin Urine: NEGATIVE
Glucose, UA: NEGATIVE mg/dL
Hgb urine dipstick: NEGATIVE
Ketones, ur: NEGATIVE mg/dL
Nitrite: NEGATIVE
Protein, ur: NEGATIVE mg/dL
Specific Gravity, Urine: 1.014 (ref 1.005–1.030)
pH: 7 (ref 5.0–8.0)

## 2021-01-11 MED ORDER — ACETAMINOPHEN 500 MG PO TABS
1000.0000 mg | ORAL_TABLET | Freq: Once | ORAL | Status: AC
  Administered 2021-01-11: 1000 mg via ORAL
  Filled 2021-01-11: qty 2

## 2021-01-11 MED ORDER — MECLIZINE HCL 25 MG PO TABS
25.0000 mg | ORAL_TABLET | Freq: Once | ORAL | Status: AC
  Administered 2021-01-11: 25 mg via ORAL
  Filled 2021-01-11: qty 1

## 2021-01-11 MED ORDER — MECLIZINE HCL 25 MG PO TABS: 25.0000 mg | ORAL_TABLET | Freq: Three times a day (TID) | ORAL | 0 refills | Status: DC | PRN

## 2021-01-11 MED ORDER — IOHEXOL 300 MG/ML  SOLN
100.0000 mL | Freq: Once | INTRAMUSCULAR | Status: AC | PRN
  Administered 2021-01-11: 100 mL via INTRAVENOUS

## 2021-01-11 NOTE — Progress Notes (Signed)
Client was unable to participate in group today.

## 2021-01-11 NOTE — ED Provider Notes (Signed)
[name] DEPT Provider Note   CSN: [id number] Arrival date & time: 01/11/21  1117     History Chief Complaint  Patient presents with  . Weakness  . Dizziness    Meghan [name] is a 55 y.o. female.  Meghan [name] is a 55 y.o. female with a history of A. fib, CHF, hypertension, hyperlipidemia, prediabetes, PE, stroke, GERD, arthritis, who presents to the ED via EMS for 3 days generalized weakness and dizziness as well as abdominal pain and intermittent chest pains.  Patient reports that she started feeling very poorly.  Reports that with position changes she has dizziness, and has a prior history of vertigo but this dizziness feels different.  She reports that typically she has a room spinning sensation but this time it feels like things are shaking back-and-forth.  She reports associated headache, no visual changes.  Reports feeling generally weak but does not localize this to 1 area.  Also reports she has had some intermittent brief central chest pains that do not radiate, not associated with shortness of breath.  She also complains of mid right-sided abdominal pain that has been persistent over the past 3 days.  No associated vomiting or diarrhea.  No fevers or chills.  Denies any dysuria or urinary frequency.  Reports that she has not tried symptoms prior.  Called her PCP and symptoms began 3 days ago and was told to come to the emergency department but waited until today.        Past Medical History:  Diagnosis Date  . Arthritis    left knee  . Asthma   . Atrial fibrillation ([place])   . Atrial fibrillation ([place]) 02/16/2019  . CHF (congestive heart failure) ([place]) 01/2019   Physician from [place] informed her she had this   . Depression   . Dysrhythmia    Atrial fibrillation  . GERD (gastroesophageal reflux disease)   . Hiatal hernia   . History of pulmonary embolus (PE)   . Hypercholesterolemia   . Macular degeneration    of left eye  .  Osteoarthritis    knees  . Pneumonia    in the past  . Pre-diabetes   . Pulmonary embolism ([place])    bilat  . Seizure ([place])   . Seizure disorder ([place]) 2017  . Sleep apnea CPAP   [facility]  . Stroke [facility])    1989 and 1995 (left sided weakness)  . Vitamin D deficiency     Patient Active Problem List   Diagnosis Date Noted  . Intractable nausea and vomiting 11/16/2020  . Ileus ([place]) 11/14/2020  . Fecal impaction ([place]) 11/14/2020  . Left-sided weakness 09/29/2020  . Asthma 09/29/2020  . Pre-diabetes 09/29/2020  . Pain in left finger(s) 07/12/2020  . Seizure-like activity ([place]) 03/25/2020  . History of head injury 03/25/2020  . Obstructive sleep apnea syndrome 09/04/2019  . Acute medial meniscus tear of right knee 07/10/2019  . Class 3 severe obesity due to excess calories with serious comorbidity and body mass index (BMI) of 50.0 to 59.9 in adult ([place]) 06/01/2019  . Atrial fibrillation ([place]) 04/17/2019  . History of pulmonary embolus (PE) 04/17/2019  . Type 2 diabetes mellitus, without long-term current use of insulin ([place]) 04/17/2019    Past Surgical History:  Procedure Laterality Date  . ABDOMINAL HYSTERECTOMY  2003   complete  . CARDIAC CATHETERIZATION  2017   in [facility] in [place]  . CESAREAN SECTION  [id number]   x  2  . CESAREAN SECTION     x 2  . CHOLECYSTECTOMY  2003  . COLONOSCOPY WITH PROPOFOL N/A 06/05/2019   Procedure: COLONOSCOPY WITH PROPOFOL;  Surgeon: Carol Ada, MD;  Location: [facility];  Service: Endoscopy;  Laterality: N/A;  . CYST REMOVAL WITH BONE GRAFT Left 10/18/2020   Procedure: BONE GRAFTING OF ENCHONDROMA MIDDLE [place] OF LEFT MIDDLE FINGER;  Surgeon: Daryll Brod, MD;  Location: [place];  Service: Orthopedics;  Laterality: Left;  AXILLARY BLOCK  . DIAGNOSTIC LAPAROSCOPY  2015; 2017   lap hernia repair x2  . HERNIA REPAIR  [id number] [phone]   umbilical hernia repair  . IVC FILTER INSERTION  2003   Hx pulmonary embolus  . POLYPECTOMY   06/05/2019   Procedure: POLYPECTOMY;  Surgeon: Carol Ada, MD;  Location: [facility];  Service: Endoscopy;;  . SHOULDER ARTHROSCOPY W/ ROTATOR CUFF REPAIR  08/28/2017   right shoulder  . WRIST ARTHROSCOPY WITH DEBRIDEMENT Left 10/18/2020   Procedure: LEFT WRIST ARTHROSCOPY WITH DEBRIDEMENT;  Surgeon: Daryll Brod, MD;  Location: [place] [facility];  Service: Orthopedics;  Laterality: Left;  AXILLARY BLOCK     OB History   No obstetric history on file.     Family History  Problem Relation Age of Onset  . Breast cancer Mother   . Breast cancer Maternal Grandmother   . Heart attack Father   . Diabetes Father   . Congestive Heart Failure Father     Social History   Tobacco Use  . Smoking status: Never Smoker  . Smokeless tobacco: Never Used  Vaping Use  . Vaping Use: Never used  Substance Use Topics  . Alcohol use: Never  . Drug use: Never    Home Medications Prior to Admission medications   Medication Sig Start Date End Date Taking? Authorizing Provider  acetaminophen (TYLENOL) 500 MG tablet Take 1,000 mg by mouth every 6 (six) hours as needed for moderate pain.    [provider]  albuterol (VENTOLIN HFA) 108 (90 Base) MCG/ACT inhaler Inhale 2 puffs into the lungs as needed for wheezing or shortness of breath.    [provider]  BREZTRI AEROSPHERE 160-9-4.8 MCG/ACT AERO Inhale 2 puffs into the lungs in the morning and at bedtime.  03/24/20   [provider]  Cholecalciferol (VITAMIN D-3) 25 MCG (1000 UT) CAPS Take 1,000 Units by mouth daily.    [provider]  cycloSPORINE (RESTASIS) 0.05 % ophthalmic emulsion Place 1 drop into both eyes 2 (two) times daily.     [provider]  flecainide (TAMBOCOR) 50 MG tablet Take 50 mg by mouth 2 (two) times daily.  09/04/19   [provider]  furosemide (LASIX) 40 MG tablet Take 40 mg by mouth daily with breakfast.     [provider]  levETIRAcetam (KEPPRA) 500 MG tablet Take 1 tablet  (500 mg total) by mouth 2 (two) times daily. 12/15/20   Marcial Pacas, MD  metFORMIN (GLUCOPHAGE) 500 MG tablet Take 500 mg by mouth daily with breakfast.    [provider]  methocarbamol (ROBAXIN) 500 MG tablet Take 1 tablet (500 mg total) by mouth 2 (two) times daily. 12/14/20   Tedd Sias, PA  Multiple Vitamins-Minerals (PRESERVISION AREDS 2 PO) Take 2 capsules by mouth daily with breakfast.     [provider]  Omega-3 Fatty Acids (FISH OIL) 1000 MG CAPS Take 1,000 mg by mouth daily with breakfast.     [provider]  omeprazole (PRILOSEC) 40 MG  capsule Take 40 mg by mouth daily. 08/17/20   [provider]  OZEMPIC, 1 MG/DOSE, 4 MG/3ML SOPN Inject 1 mg into the skin once a week. Sundays 10/24/20   [provider]  polyethylene glycol (MIRALAX / GLYCOLAX) 17 g packet Take 17 g by mouth 2 (two) times daily. 11/17/20 01/16/21  Johnson, Clanford L, MD  promethazine (PHENERGAN) 12.5 MG tablet Take 12.5 mg by mouth at bedtime. 09/28/20   [provider]  rivaroxaban (XARELTO) 20 MG TABS tablet Take 20 mg by mouth at bedtime.     [provider]    Allergies    Acetaminophen-codeine, Other, Tramadol, Coconut oil, Meloxicam, and Tomato  Review of Systems   Review of Systems  Constitutional: Positive for fatigue. Negative for chills and fever.  HENT: Negative.   Eyes: Negative for visual disturbance.  Respiratory: Negative for cough and shortness of breath.   Cardiovascular: Positive for chest pain.  Gastrointestinal: Positive for abdominal pain. Negative for abdominal distention, diarrhea, nausea and vomiting.  Genitourinary: Negative for dysuria and frequency.  Musculoskeletal: Negative for arthralgias, myalgias and neck pain.  Skin: Negative for color change and rash.  Neurological: Positive for dizziness, weakness (Generalized), light-headedness and headaches. Negative for seizures, syncope, facial asymmetry and numbness.  All other  systems reviewed and are negative.   Physical Exam Updated Vital Signs BP 135/86   Pulse (!) 54   Temp 97.6 F (36.4 C) (Oral)   Resp 18   Ht 5' 6" (1.676 m)   Wt 135.6 kg   SpO2 99%   BMI 48.26 kg/m   Physical Exam Vitals and nursing note reviewed.  Constitutional:      General: She is not in acute distress.    Appearance: Normal appearance. She is well-developed. She is obese. She is not ill-appearing or diaphoretic.     Comments: Alert, chronically ill-appearing but in no acute distress.  HENT:     Head: Normocephalic and atraumatic.     Mouth/Throat:     Mouth: Mucous membranes are moist.     Pharynx: Oropharynx is clear.  Eyes:     General:        Right eye: No discharge.        Left eye: No discharge.     Pupils: Pupils are equal, round, and reactive to light.     Comments: No clear nystagmus but patient with significant dizziness with extraocular movements  Cardiovascular:     Rate and Rhythm: Normal rate and regular rhythm.     Pulses: Normal pulses.     Heart sounds: Normal heart sounds. No murmur heard. No friction rub. No gallop.   Pulmonary:     Effort: Pulmonary effort is normal. No respiratory distress.     Breath sounds: Normal breath sounds. No wheezing or rales.     Comments: Respirations equal and unlabored, patient able to speak in full sentences, lungs clear to auscultation bilaterally  Abdominal:     General: Bowel sounds are normal. There is no distension.     Palpations: Abdomen is soft. There is no mass.     Tenderness: There is no abdominal tenderness. There is no guarding.     Comments: Abdomen is soft, nondistended, bowel sounds present throughout, there is tenderness in the right midabdomen without guarding, all other quadrants nontender.  Multiple prior surgical scars noted.  Musculoskeletal:        General: No deformity.     Cervical back: Normal range of motion and  neck supple. No tenderness.     Right lower leg: No edema.     Left  lower leg: No edema.  Skin:    General: Skin is warm and dry.     Capillary Refill: Capillary refill takes less than 2 seconds.  Neurological:     Mental Status: She is alert.     Coordination: Coordination normal.     Comments: Speech is clear, able to follow commands CN III-XII intact Normal strength in upper and lower extremities bilaterally including dorsiflexion and plantar flexion, strong and equal grip strength Sensation normal to light and sharp touch Moves extremities without ataxia, coordination intact  Psychiatric:        Mood and Affect: Mood normal.        Behavior: Behavior normal.     ED Results / Procedures / Treatments   Labs (all labs ordered are listed, but only abnormal results are displayed) Labs Reviewed  COMPREHENSIVE METABOLIC PANEL - Abnormal; Notable for the following components:      Result Value   AST 14 (*)    Alkaline Phosphatase 199 (*)    Total Bilirubin 0.2 (*)    All other components within normal limits  URINALYSIS, ROUTINE W REFLEX MICROSCOPIC - Abnormal; Notable for the following components:   Leukocytes,Ua TRACE (*)    Bacteria, UA RARE (*)    All other components within normal limits  CBC WITH DIFFERENTIAL/PLATELET - Abnormal; Notable for the following components:   Hemoglobin 11.5 (*)    All other components within normal limits  LIPASE, BLOOD  TROPONIN I (HIGH SENSITIVITY)  TROPONIN I (HIGH SENSITIVITY)    EKG EKG Interpretation  Date/Time:  Wednesday January 11 2021 11:29:00 EDT Ventricular Rate:  59 PR Interval:  254 QRS Duration: 81 QT Interval:  400 QTC Calculation: 397 R Axis:   63 Text Interpretation: Sinus rhythm Prolonged PR interval Nonspecific T abnormalities, anterior leads Nonspecific ST and T wave abnormality Confirmed by Varney Biles ([id number]) on 01/11/2021 1:59:02 PM   Radiology DG Chest Port 1 View  Result Date: 01/11/2021 CLINICAL DATA:  Weakness. EXAM: PORTABLE CHEST 1 VIEW COMPARISON:  November 14, 2020.  FINDINGS: Similar enlargement the cardiac silhouette. Pulmonary vascular congestion. No new consolidation. No visible pleural effusions or pneumothorax on this single semi erect radiograph. IMPRESSION: Similar cardiomegaly and pulmonary vascular congestion without overt edema. No acute abnormality. Electronically Signed   By: Margaretha Sheffield MD   On: 01/11/2021 14:09    Procedures Procedures   Medications Ordered in ED Medications  meclizine (ANTIVERT) tablet 25 mg (25 mg Oral Given 01/11/21 1355)  acetaminophen (TYLENOL) tablet 1,000 mg (1,000 mg Oral Given 01/11/21 1355)  iohexol (OMNIPAQUE) 300 MG/ML solution 100 mL (100 mLs Intravenous Contrast Given 01/11/21 1518)    ED Course  I have reviewed the triage vital signs and the nursing notes.  Pertinent labs & imaging results that were available during my care of the patient were reviewed by me and considered in my medical decision making (see chart for details).    MDM Rules/Calculators/A&P                         55 y.o. female presents to the ED with multiple complaints including dizziness, headache, generalized weakness, right-sided abdominal pain and intermittent chest pains, this involves an extensive number of treatment options, and is a complaint that carries with it a high risk of complications and morbidity.    Patient with many  complaints that do not point to a clear diagnosis, will need broad work-up.  On arrival pt is nontoxic, vitals significant for slight bradycardia but otherwise unremarkable. Exam significant for dizziness with any position change, tenderness over the right mid abdomen, prior surgical scars noted.  No focal neurologic deficits.  Additional history obtained from chart review. Previous records obtained and reviewed via EMR  I ordered Tylenol for headache, and meclizine for dizziness  Lab Tests:  I Ordered, reviewed, and interpreted labs, which included:  CBC: No leukocytosis, stable hemoglobin CMP: No  significant electrolyte derangements, normal renal function, mildly elevated alk phos, the rest of patient's LFTs are unremarkable. Lipase: WNL UA: Trace leukocytes, bacteria, no other signs of infection. Troponin: Negative, delta troponin pending  EKG shows sinus rhythm with some nonspecific ST changes.  Imaging Studies ordered:  I ordered imaging studies which included CXR, head CT, CT abdomen pelvis, I independently visualized and interpreted imaging which showed : Chest x-ray shows stable cardiomegaly and pulmonary vascular congestion without edema or other active cardiopulmonary disease  CT of the head as well as CT abdomen pelvis pending.  ED Course:   At shift change care signed out to PA Domenic Moras who will follow up on pending lab work and imaging.  If head and abdominal CT are reassuring patient will need to be ambulated, if she still has persistent dizziness will need MRI.    Portions of this note were generated with [employer]. Dictation errors may occur despite best attempts at proofreading.   Final Clinical Impression(s) / ED Diagnoses Final diagnoses:  Dizziness  Right sided abdominal pain    Rx / DC Orders ED Discharge Orders    None       Janet Berlin 01/11/21 [place], Ankit, MD 01/12/21 2224

## 2021-01-11 NOTE — ED Notes (Signed)
Ambulated pt approximately 37ft Pt tolerated well Pt did not c/o dizziness or nausea

## 2021-01-11 NOTE — ED Provider Notes (Signed)
Received signout from previous providers, please see the note for complete H&P.  This is a 55 year old female with multiple comorbidities who presents with multiple complaints which include dizziness headache and weakness.  Per extensive work-up was performed today excluding brain MRI.  Patient was given meclizine for dizziness and Tylenol for headache.  Her work-up has been essentially unremarkable.  She was able to ambulate and felt better.  Will discharge home with meclizine and outpatient follow-up.  BP 130/81   Pulse (!) 58   Temp 97.6 F (36.4 C) (Oral)   Resp 16   Ht 5\' 6"  (1.676 m)   Wt 135.6 kg   SpO2 100%   BMI 48.26 kg/m   Results for orders placed or performed during the hospital encounter of 01/11/21  Resp Panel by RT-PCR (Flu A&B, Covid) Nasopharyngeal Swab   Specimen: Nasopharyngeal Swab; Nasopharyngeal(NP) swabs in vial transport medium  Result Value Ref Range   SARS Coronavirus 2 by RT PCR NEGATIVE NEGATIVE   Influenza A by PCR NEGATIVE NEGATIVE   Influenza B by PCR NEGATIVE NEGATIVE  Comprehensive metabolic panel  Result Value Ref Range   Sodium 143 135 - 145 mmol/L   Potassium 4.2 3.5 - 5.1 mmol/L   Chloride 107 98 - 111 mmol/L   CO2 29 22 - 32 mmol/L   Glucose, Bld 93 70 - 99 mg/dL   BUN 15 6 - 20 mg/dL   Creatinine, Ser 0.58 0.44 - 1.00 mg/dL   Calcium 10.3 8.9 - 10.3 mg/dL   Total Protein 7.6 6.5 - 8.1 g/dL   Albumin 3.7 3.5 - 5.0 g/dL   AST 14 (L) 15 - 41 U/L   ALT 13 0 - 44 U/L   Alkaline Phosphatase 199 (H) 38 - 126 U/L   Total Bilirubin 0.2 (L) 0.3 - 1.2 mg/dL   GFR, Estimated >60 >60 mL/min   Anion gap 7 5 - 15  Urinalysis, Routine w reflex microscopic Urine, Clean Catch  Result Value Ref Range   Color, Urine YELLOW YELLOW   APPearance CLEAR CLEAR   Specific Gravity, Urine 1.014 1.005 - 1.030   pH 7.0 5.0 - 8.0   Glucose, UA NEGATIVE NEGATIVE mg/dL   Hgb urine dipstick NEGATIVE NEGATIVE   Bilirubin Urine NEGATIVE NEGATIVE   Ketones, ur  NEGATIVE NEGATIVE mg/dL   Protein, ur NEGATIVE NEGATIVE mg/dL   Nitrite NEGATIVE NEGATIVE   Leukocytes,Ua TRACE (A) NEGATIVE   RBC / HPF 0-5 0 - 5 RBC/hpf   WBC, UA 0-5 0 - 5 WBC/hpf   Bacteria, UA RARE (A) NONE SEEN   Squamous Epithelial / LPF 0-5 0 - 5   Mucus PRESENT   CBC with Differential  Result Value Ref Range   WBC 5.4 4.0 - 10.5 K/uL   RBC 4.35 3.87 - 5.11 MIL/uL   Hemoglobin 11.5 (L) 12.0 - 15.0 g/dL   HCT 36.3 36.0 - 46.0 %   MCV 83.4 80.0 - 100.0 fL   MCH 26.4 26.0 - 34.0 pg   MCHC 31.7 30.0 - 36.0 g/dL   RDW 15.1 11.5 - 15.5 %   Platelets 270 150 - 400 K/uL   nRBC 0.0 0.0 - 0.2 %   Neutrophils Relative % 59 %   Neutro Abs 3.3 1.7 - 7.7 K/uL   Lymphocytes Relative 30 %   Lymphs Abs 1.6 0.7 - 4.0 K/uL   Monocytes Relative 8 %   Monocytes Absolute 0.4 0.1 - 1.0 K/uL   Eosinophils Relative 2 %  Eosinophils Absolute 0.1 0.0 - 0.5 K/uL   Basophils Relative 1 %   Basophils Absolute 0.0 0.0 - 0.1 K/uL   Immature Granulocytes 0 %   Abs Immature Granulocytes 0.01 0.00 - 0.07 K/uL  Lipase, blood  Result Value Ref Range   Lipase 42 11 - 51 U/L  Troponin I (High Sensitivity)  Result Value Ref Range   Troponin I (High Sensitivity) 4 <18 ng/L  Troponin I (High Sensitivity)  Result Value Ref Range   Troponin I (High Sensitivity) 3 <18 ng/L   DG Wrist Complete Right  Result Date: 12/14/2020 CLINICAL DATA:  Fall. EXAM: RIGHT HAND - COMPLETE 3+ VIEW; RIGHT WRIST - COMPLETE 3+ VIEW COMPARISON:  Right wrist x-rays dated November 14, 2020. FINDINGS: There is no evidence of fracture or dislocation. There is no evidence of arthropathy or other focal bone abnormality. Soft tissues are unremarkable. IMPRESSION: 1. Negative right hand and wrist x-rays. Electronically Signed   By: Titus Dubin M.D.   On: 12/14/2020 13:30   CT Head Wo Contrast  Result Date: 01/11/2021 CLINICAL DATA:  55 year old female with dizziness. EXAM: CT HEAD WITHOUT CONTRAST TECHNIQUE: Contiguous axial  images were obtained from the base of the skull through the vertex without intravenous contrast. COMPARISON:  Head CT dated 11/06/2020. FINDINGS: Brain: The ventricles and sulci are appropriate size for patient's age. The gray-white matter discrimination is preserved. There is no acute intracranial hemorrhage. No mass effect or midline shift. No extra-axial fluid collection. Vascular: No hyperdense vessel or unexpected calcification. Skull: Normal. Negative for fracture or focal lesion. Sinuses/Orbits: No acute finding. Other: None IMPRESSION: Unremarkable noncontrast CT of the brain. Electronically Signed   By: Anner Crete M.D.   On: 01/11/2021 15:38   CT Abdomen Pelvis W Contrast  Result Date: 01/11/2021 CLINICAL DATA:  Abdominal pain and intermittent chest pain x3 days EXAM: CT ABDOMEN AND PELVIS WITH CONTRAST TECHNIQUE: Multidetector CT imaging of the abdomen and pelvis was performed using the standard protocol following bolus administration of intravenous contrast. CONTRAST:  179mL OMNIPAQUE IOHEXOL 300 MG/ML  SOLN COMPARISON:  November 15, 2020 FINDINGS: Lower chest: Hypoventilatory change in the dependent lungs. Mild cardiomegaly. Small hiatal hernia. Hepatobiliary: No suspicious hepatic lesion. Gallbladder surgically absent. Similar mild prominence of the intra and extrahepatic biliary tree, likely reservoir effect post cholecystectomy. Pancreas: Within normal limits. Spleen: Stable small subcentimeter hypodense lesion in the posterior spleen on image 27/2. Adrenals/Urinary Tract: Adrenal glands are unremarkable. Kidneys are normal, without renal calculi, focal lesion, or hydronephrosis. Bladder is decompressed limiting evaluation. Stomach/Bowel: Small hiatal hernia otherwise the stomach is grossly unremarkable for degree of distension. Small duodenal diverticulum. No suspicious small bowel wall thickening or dilation. The appendix and terminal ileum are grossly unremarkable. Small volume of formed  stool throughout the colon. Scattered left-sided colonic diverticulosis without findings of acute diverticulitis. Vascular/Lymphatic: Aortic atherosclerosis. No abdominal aortic aneurysm. IVC filter at the L3-L4 vertebral level with struck penetration and unchanged struts touching the anterior aspect of the L4 vertebral body and left common iliac artery. No pathologically enlarged abdominal or pelvic lymph nodes. Reproductive: Status post hysterectomy. No adnexal masses. Other: Small ventral hernia containing fat and nonobstructed small bowel. Musculoskeletal: No acute osseous abnormality. IMPRESSION: 1. No acute findings in the abdomen or pelvis. 2. Small ventral hernia containing fat and nonobstructed small bowel. 3. Colonic diverticulosis without findings of acute diverticulitis. 4. Aortic atherosclerosis. Aortic Atherosclerosis (ICD10-I70.0). Electronically Signed   By: Dahlia Bailiff MD   On: 01/11/2021 15:47  DG Chest Port 1 View  Result Date: 01/11/2021 CLINICAL DATA:  Weakness. EXAM: PORTABLE CHEST 1 VIEW COMPARISON:  November 14, 2020. FINDINGS: Similar enlargement the cardiac silhouette. Pulmonary vascular congestion. No new consolidation. No visible pleural effusions or pneumothorax on this single semi erect radiograph. IMPRESSION: Similar cardiomegaly and pulmonary vascular congestion without overt edema. No acute abnormality. Electronically Signed   By: Margaretha Sheffield MD   On: 01/11/2021 14:09   DG Hand Complete Right  Result Date: 12/14/2020 CLINICAL DATA:  Fall. EXAM: RIGHT HAND - COMPLETE 3+ VIEW; RIGHT WRIST - COMPLETE 3+ VIEW COMPARISON:  Right wrist x-rays dated November 14, 2020. FINDINGS: There is no evidence of fracture or dislocation. There is no evidence of arthropathy or other focal bone abnormality. Soft tissues are unremarkable. IMPRESSION: 1. Negative right hand and wrist x-rays. Electronically Signed   By: Titus Dubin M.D.   On: 12/14/2020 13:30      Domenic Moras,  PA-C 01/11/21 Priscella Mann, MD 01/11/21 [phone]

## 2021-01-11 NOTE — ED Triage Notes (Signed)
Patient BIB [facility] c/o global weakness to the point that she cannot walk X3 days.  Patient passed EMS stroke screen.  Pupal are normal and reactive.  Patient was able to move herself over to ER bed by herself.  Patient has had a stroke in the past.    Vitals were 117-CBG 138/92 50-60-HR 16-RR 98% room air

## 2021-01-11 NOTE — ED Notes (Signed)
X RAY at bedside 

## 2021-01-16 ENCOUNTER — Other Ambulatory Visit: Payer: Self-pay

## 2021-01-16 ENCOUNTER — Ambulatory Visit (INDEPENDENT_AMBULATORY_CARE_PROVIDER_SITE_OTHER): Payer: Medicare Other | Admitting: Licensed Clinical Social Worker

## 2021-01-16 DIAGNOSIS — F331 Major depressive disorder, recurrent, moderate: Secondary | ICD-10-CM | POA: Diagnosis not present

## 2021-01-16 DIAGNOSIS — F4310 Post-traumatic stress disorder, unspecified: Secondary | ICD-10-CM

## 2021-01-16 DIAGNOSIS — F411 Generalized anxiety disorder: Secondary | ICD-10-CM | POA: Diagnosis not present

## 2021-01-16 NOTE — Progress Notes (Signed)
Virtual Visit via Video Note   I connected with Meghan [name] on 01/16/21 at 10:00am by video enabled telemedicine application and verified that I am speaking with the correct person using two identifiers.   I discussed the limitations, risks, security and privacy concerns of performing an evaluation and management service by video and the availability of in person appointments. I also discussed with the patient that there may be a patient responsible charge related to this service. The patient expressed understanding and agreed to proceed.   I discussed the assessment and treatment plan with the patient. The patient was provided an opportunity to ask questions and all were answered. The patient agreed with the plan and demonstrated an understanding of the instructions.   The patient was advised to call back or seek an in-person evaluation if the symptoms worsen or if the condition fails to improve as anticipated.   I provided 1 hour of non-face-to-face time during this encounter.     [name], LCSW, LCAS _______________________ THERAPIST PROGRESS NOTE   Session Time: 10:00am - 11:00am   Location: Patient: Patient Home Provider: [facility] [place] [facility]    Participation Level: Active    Behavioral Response: Alert, casually dressed, angry mood/affect   Type of Therapy:  Individual Therapy   Treatment Goals addressed: Anxiety/depression management; medication management; Maintaining healthy boundaries; Maintaining medical appointments   Interventions: CBT, anger management and healthy boundaries, referral for MHIOP    Summary: Meghan [name] is a 55 year old divorced African American female that presented for therapy appointment today with diagnoses of Major Depressive Disorder, recurrent, moderate, Generalized Anxiety Disorder and PTSD.      Suicidal/Homicidal: None; without plan or intent    Therapist Response:  Clinician met with Meghan [name] for virtual therapy appointment and assessed for safety,  sobriety, and medication compliance.  Meghan [name] presented for today's session on time and was alert, oriented x5, with no evidence or self-report of active SI/HI or A/V H.  Meghan [name] reported that she continues taking medication responsibly and denied any use of alcohol or illicit substances.  Clinician inquired about Meghan [name]'s current emotional ratings, as well as any significant changes in thoughts, feelings, or behavior since previous check-in.  Meghan [name] reported scores of 0/10 for depression, 0/10 for anxiety, 10/10 for anger, but denied experiencing any panic attacks.  Meghan [name] reported that she is angry today because of family members stressing her out.  Meghan [name] stated "My daughter is mad because I'm not doing things on her terms, I'm not the mom I used to be".  Clinician praised Meghan [name] for effective use of boundaries with family members and assertively addressing her personal needs.  Meghan [name] reported that an additional stressor is her health, stating "On Thursday I woke up dizzy, called an ambulance, and EMS showed up, and I was unresponsive and they took me to the hospital".  Meghan [name] reported that she is now on an additional medication for this, and will meet with her doctor for an oxygen test soon.  Clinician encouraged Meghan [name] to consider impact stressors are having upon physical health, and continue keeping up with medical professionals.  Meghan [name] stated "I know its because of all their problems, and I think I might need to take a break from them (daughters) for awhile".  Meghan [name] reported that her sister has noticed a change in her behavior recently, and wants to help but doesn't understand what is going on with her.  Clinician recommended Meghan [name] refer her sister to free community mental health groups such as '[facility]  [facility] Friends' through [facility] so she can learn how to Meghan [name] more effectively during treatment and understand her unique needs.  Meghan [name] reported that she believes her sister could also benefit from  therapy, so she will make this referral when she speaks to her again.  Meghan [name] also revisited recommendation for Meghan [name] to integrate anger management activities into routine as a healthy outlet for stress.  Meghan [name] was agreeable to this, noting that she feels that she should get outdoors more, including visiting the gym or exploring local volunteer opportunities.  Interventions were effective, as evidenced by Meghan [name] reporting that today she will call Meghan [name] about applying for volunteer opportunities, as well as make more of an effort this weekend to visit a new church to increase spiritual and community support.  Meghan [name] reported that she has also considered engaging in Meghan [name] again due to rising stress, and her sister has suggested this too.  Clinician agreed to outreach case manager about the possibility of Meghan [name] entering group again and will continue to monitor.                                          Plan: Follow up again in 1 week virtually.    Diagnosis: Major depressive disorder, recurrent, moderate; Generalized Anxiety Disorder; and PTSD   [name], LCSW, LCAS 01/16/21

## 2021-01-17 ENCOUNTER — Telehealth (HOSPITAL_COMMUNITY): Payer: Self-pay | Admitting: Psychiatry

## 2021-01-17 ENCOUNTER — Other Ambulatory Visit: Payer: Self-pay

## 2021-01-17 ENCOUNTER — Ambulatory Visit (INDEPENDENT_AMBULATORY_CARE_PROVIDER_SITE_OTHER): Payer: Medicare Other | Admitting: Psychiatry

## 2021-01-17 DIAGNOSIS — F331 Major depressive disorder, recurrent, moderate: Secondary | ICD-10-CM

## 2021-01-17 DIAGNOSIS — F411 Generalized anxiety disorder: Secondary | ICD-10-CM

## 2021-01-18 ENCOUNTER — Encounter (HOSPITAL_COMMUNITY): Payer: Self-pay | Admitting: Psychiatry

## 2021-01-18 NOTE — Progress Notes (Signed)
Virtual Visit via Video Note  I connected with Meghan [name] on 01/18/21 at 12:00 PM EDT by a video enabled telemedicine application and verified that I am speaking with the correct person using two identifiers.  I discussed the limitations of evaluation and management by telemedicine and the availability of in person appointments. The patient expressed understanding and agreed to proceed.    Location: Patient: Patient Home Provider: Home Office   GROUP GOAL: Client will attend IOP Aftercare Group Therapy 2-4x a month to connect with peers and to apply strategies discussed to improve mental health condition.  History of Present Illness: MDD and PTSD  Observations/Objective: Counselor met with Patient in the context of Group Therapy, via Webex. Counselor prompted Patient to share updates on coping skill application and individual therapeutic process in management of mental health. Client reports intentional efforts to maintain mental health.  Counselor prompted Patient to share areas of concern, challenges and identify barriers to meeting current goals. Topics covered: avoidance, cognitive distortions, anxiety coping strategies, work related stressors, mental health triggers by family, and how physical health impact medical health. Patient engaged in discussion, provided feedback for others within the group and took note of additional strategies reviewed and discussed.   Assessment and Plan: Counselor recommends client continue following treatment plan goals, following crisis plan and following up with behavioral health and medical providers as needed. Patient is welcome to join group at next session.   Follow Up Instructions: Counselor to provide link for next session via Webex platform. The patient was advised to call back or seek an in-person evaluation if the symptoms worsen or if the condition fails to improve as anticipated.  I provided 90 minutes of non-face-to-face time during this  encounter.   Lise Auer, LCSW

## 2021-01-23 ENCOUNTER — Other Ambulatory Visit: Payer: Self-pay

## 2021-01-23 ENCOUNTER — Ambulatory Visit (INDEPENDENT_AMBULATORY_CARE_PROVIDER_SITE_OTHER): Payer: Medicare Other | Admitting: Licensed Clinical Social Worker

## 2021-01-23 DIAGNOSIS — F331 Major depressive disorder, recurrent, moderate: Secondary | ICD-10-CM

## 2021-01-23 DIAGNOSIS — F411 Generalized anxiety disorder: Secondary | ICD-10-CM | POA: Diagnosis not present

## 2021-01-23 DIAGNOSIS — F4310 Post-traumatic stress disorder, unspecified: Secondary | ICD-10-CM | POA: Diagnosis not present

## 2021-01-23 NOTE — Progress Notes (Signed)
Virtual Visit via Video Note   I connected with Meghan [name] on 01/23/21 at 1:00pm by video enabled telemedicine application and verified that I am speaking with the correct person using two identifiers.   I discussed the limitations, risks, security and privacy concerns of performing an evaluation and management service by video and the availability of in person appointments. I also discussed with the patient that there may be a patient responsible charge related to this service. The patient expressed understanding and agreed to proceed.   I discussed the assessment and treatment plan with the patient. The patient was provided an opportunity to ask questions and all were answered. The patient agreed with the plan and demonstrated an understanding of the instructions.   The patient was advised to call back or seek an in-person evaluation if the symptoms worsen or if the condition fails to improve as anticipated.   I provided 50 minutes of non-face-to-face time during this encounter.     [name], LCSW, LCAS _______________________ THERAPIST PROGRESS NOTE   Session Time: 1:00pm - 1:50pm    Location: Patient: Patient Home Provider: [facility] [place] Office    Participation Level: Active    Behavioral Response: Alert, casually dressed, euthymic mood/affect   Type of Therapy:  Individual Therapy   Treatment Goals addressed: Anxiety/depression management; medication management; Maintaining healthy boundaries; Sleep hygiene    Interventions: CBT, mindful breathing meditation    Summary: Meghan [name] is a 55 year old divorced African American female that presented for therapy appointment today with diagnoses of Major Depressive Disorder, recurrent, moderate, Generalized Anxiety Disorder and PTSD.      Suicidal/Homicidal: None; without plan or intent    Therapist Response:  Clinician met with Meghan [name] for virtual therapy session and assessed for safety, sobriety, and medication compliance.  Meghan [name]  presented for today's appointment on time and was alert, oriented x5, with no evidence or self-report of active SI/HI or A/V H.  Meghan [name] reported ongoing compliance with medication and denied any use of alcohol or illicit substances.  Clinician inquired about Meghan [name]'s emotional ratings today, as well as any significant changes in thoughts, feelings, or behavior since last check-in.  Meghan [name] reported scores of 0/10 for depression, 0/10 for anxiety, 0/10 for anger/irritability, but denied experiencing any panic attacks.  Meghan [name] reported that her son and daughter visited for Mother's Day.  Meghan [name] stated "I think the only reason I wasn't in a funk was because they kept me busy and distracted.  Now that they are gone, its like there is still this funk in the back of my mind".  Meghan [name] reported that she still intends to enter Meghan [name] on 01/25/21 due to worsening depression and stated "This time around I'm a little bit nervous".  Meghan [name] reported that she has found it difficult to sleep recently and her anxiety has been higher since family left, stating "My mind is just all over the place".  Clinician proposed practicing mindful breathing meditation exercise with Meghan [name] today in order to help calm her anxious mind.  Meghan [name] was receptive to this, so clinician invited her to get comfortable, focus on achieving a relaxed breathing pattern, and then maintain this for 10 minutes while acknowledging intrusive thoughts/feelings, but practicing letting them go.  Intervention was effective, as evidenced by Meghan [name] successfully participating in activity and noting that although she found her mind wandering often, she did feel more relaxed as a result of practice, and realized that she needed to nap before taking on the remainder of her day.  Meghan [name] stated "The reason I'm tired is because I couldn't sleep last night and I know I need to get some rest".  Meghan [name] reported that she would follow up with clinician for regular sessions again after completing  MHIOP and try to add this meditation to self-care routine.  Clinician will continue to monitor.                                          Plan: Follow up again in 1 week virtually.    Diagnosis: Major depressive disorder, recurrent, moderate; Generalized Anxiety Disorder; and PTSD   [name], LCSW, LCAS 01/23/21

## 2021-01-25 ENCOUNTER — Other Ambulatory Visit: Payer: Self-pay

## 2021-01-25 ENCOUNTER — Other Ambulatory Visit (HOSPITAL_COMMUNITY): Payer: Medicare Other | Admitting: Psychiatry

## 2021-01-25 ENCOUNTER — Telehealth (HOSPITAL_COMMUNITY): Payer: Self-pay | Admitting: Psychiatry

## 2021-01-26 ENCOUNTER — Other Ambulatory Visit: Payer: Self-pay

## 2021-01-26 ENCOUNTER — Other Ambulatory Visit (HOSPITAL_COMMUNITY): Payer: Medicare Other | Attending: Psychiatry | Admitting: Psychiatry

## 2021-01-26 ENCOUNTER — Encounter (HOSPITAL_COMMUNITY): Payer: Self-pay | Admitting: Psychiatry

## 2021-01-26 DIAGNOSIS — F411 Generalized anxiety disorder: Secondary | ICD-10-CM | POA: Diagnosis not present

## 2021-01-26 DIAGNOSIS — F329 Major depressive disorder, single episode, unspecified: Secondary | ICD-10-CM | POA: Diagnosis present

## 2021-01-26 DIAGNOSIS — F331 Major depressive disorder, recurrent, moderate: Secondary | ICD-10-CM | POA: Insufficient documentation

## 2021-01-26 DIAGNOSIS — R45851 Suicidal ideations: Secondary | ICD-10-CM | POA: Diagnosis not present

## 2021-01-26 NOTE — Progress Notes (Signed)
Virtual Visit via Video Note  I connected with Meghan [name] on 01/26/21 at  9:00 AM EDT by a video enabled telemedicine application and verified that I am speaking with the correct person using two identifiers.  At orientation to the IOP program, Case Manager discussed the limitations of evaluation and management by telemedicine and the availability of in person appointments. The patient expressed understanding and agreed to proceed with virtual visits throughout the duration of the program.   Location:  Patient: Patient Home Provider: Home Office   History of Present Illness: MDD and GAD  Observations/Objective: Check In: Case Manager checked in with all participants to review discharge dates, insurance authorizations, work-related documents and needs from the treatment team regarding medications. Client stated needs and engaged in discussion. Case Manager introduce 2 new Clients to the group, as group members welcomed and started the joining process.   Initial Therapeutic Activity: Counselor facilitated a check-in with group members to assess mood and current functioning. Client shared details of their mental health management since our last session, including challenges and successes. Counselor engaged group in discussion, covering the following topics: self-esteem, body image/concept, financial stressors, implementing relaxation skills, and stress management. Client presents with severe depression and moderate anxiety. Client denied any current SI/HI/psychosis.   Second Therapeutic Activity: Counselor introduced [employer], representative with [employer] to share about programming. Group Members asked questions and engaged in discussion, as [name] shared about Peer Support, Support Groups and the [employer]. Client stated that they are interested in connecting with the support groups, peer support, classes and volunteering.   Check Out: Counselor closed program by allowing time  to celebrate 2 graduating group members. Counselor shared reflections on progress and allow space for group members to share well wishes and appreciates to the graduating client. Counselor prompted graduating client to share takeaways, reflect on progress and final thoughts for the group. Counselor prompted group members to share what self-care practice or productivity activity they will engage over the weekend. Client plans to rest. Client endorsed safety plan to be followed to prevent safety issues.   Assessment and Plan: Clinician recommends that Client remain in IOP treatment to better manage mental health symptoms, stabilization and to address treatment plan goals. Clinician recommends adherence to crisis/safety plan, taking medications as prescribed, and following up with medical professionals if any issues arise.   Follow Up Instructions: Clinician will send Webex link for next session. The Client was advised to call back or seek an in-person evaluation if the symptoms worsen or if the condition fails to improve as anticipated.     I provided 180 minutes of non-face-to-face time during this encounter.     Lise Auer, LCSW

## 2021-01-27 ENCOUNTER — Other Ambulatory Visit: Payer: Self-pay

## 2021-01-27 ENCOUNTER — Other Ambulatory Visit (HOSPITAL_COMMUNITY): Payer: Medicare Other | Admitting: Family

## 2021-01-27 DIAGNOSIS — F411 Generalized anxiety disorder: Secondary | ICD-10-CM

## 2021-01-27 DIAGNOSIS — F331 Major depressive disorder, recurrent, moderate: Secondary | ICD-10-CM

## 2021-01-27 NOTE — Progress Notes (Signed)
Virtual Visit via Video Note  I connected with [name] on @TODAY @ at  9:00 AM EDT by a video enabled telemedicine application and verified that I am speaking with the correct person using two identifiers.  Location: Patient: at home Provider: at office   I discussed the limitations of evaluation and management by telemedicine and the availability of in person appointments. The patient expressed understanding and agreed to proceed.  I discussed the assessment and treatment plan with the patient. The patient was provided an opportunity to ask questions and all were answered. The patient agreed with the plan and demonstrated an understanding of the instructions.   The patient was advised to call back or seek an in-person evaluation if the symptoms worsen or if the condition fails to improve as anticipated.  I provided 20 minutes of non-face-to-face time during this encounter.   Carlis Abbott, RITA, M.Ed, CNA  Patient ID: SAHARRA SANTO, female   DOB: May 11, 1966, 55 y.o.   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 Jakaylah reported that she had begun feeling increasingly depressed and anxious during this time, and sought therapy for support. Saveah reported that weekly therapy has proved to be beneficial, as it gives her an opportunity to open up about her struggles without judgment, work on maintaining boundaries with family, improve communication skills, and increase available support. Jamaira reported that she would like to continue with therapy to help manage symptoms of depression, anxiety and trauma, stating "I still have my days where I'm depressed and get anxious about a bunch of different stuff".  [name] therapist referred pt to virtual [place] again d/t  worsening depressive and anxiety sx's.  Was previously in virtual [place] in Feb. 2022 d/t similar sx's.  Denies SI/HI or A/V hallucinations.  Stressors include:  1) Medical Issues:  Pt has had prior procedures (ie. 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  2) Continued conflictual relationships with daughters.  Pt apparently has poor boundaries with her twin daughters.  "I practically raised my oldest grandson (age 25).  He has ADHD and wants to come down here and my daughter won't let him."  Pt became very tearful, stating that she used to talk to him everyday and her daughter won't let her now because she's jealous of their close relationship.  Pt states she was fine when her children were visiting for Mother's Day; but states she is now in a "funk."  "I guess I was just putting on a happy face whenever they were visiting but now I'm very depressed. Pt c/o tearfulness, irritability, isolative, poor concentration, anhedonia, no energy or motivation.  States that Dr. Altamese Connell just increased her medications recently. CC: previous CCA for more information/hx. A:  Re-Oriented pt to virtual [facility]-IOP.  Pt gave verbal consent for treatment, to release chart information to referred providers and to complete any forms if needed.  Pt also gave consent for attending group virtually d/t COVID-19 social distancing restrictions.  Encouraged support groups thru [place] and possibly DBT.  F/U with [name], LCSW and Dr. Altamese Cosmos.  R:  Pt receptive.   Dellia Nims, M.Ed, CNA

## 2021-01-28 ENCOUNTER — Encounter (HOSPITAL_COMMUNITY): Payer: Self-pay

## 2021-01-28 NOTE — Progress Notes (Signed)
Virtual Visit via Video Note  I connected with Meghan [name] on 01/27/21 at  9:00 AM EDT by a video enabled telemedicine application and verified that I am speaking with the correct person using two identifiers.  Location: Patient: Home Provider: Home   I discussed the limitations of evaluation and management by telemedicine and the availability of in person appointments. The patient expressed understanding and agreed to proceed.   I discussed the assessment and treatment plan with the patient. The patient was provided an opportunity to ask questions and all were answered. The patient agreed with the plan and demonstrated an understanding of the instructions.   The patient was advised to call back or seek an in-person evaluation if the symptoms worsen or if the condition fails to improve as anticipated.  I provided 15 minutes of non-face-to-face time during this encounter.   Derrill Center, NP    Psychiatric Initial Adult Assessment   Patient Identification: Meghan [name] MRN:  [id number] Date of Evaluation:  01/28/2021 Referral Source: [name]  Chief Complaint:  Worsening depression and anxiety Visit Diagnosis:    ICD-10-CM   1. Major depressive disorder, recurrent episode, moderate (HCC)  F33.1   2. Generalized anxiety disorder  F41.1     History of Present Illness:  Meghan [name] 55 year old African-American female was referred by her therapist Chryl Heck due to worsening depression and anxiety. Patient recently completed Intensive Outpatient programing which she reported was helpful at the time however reports ongoing stressors related to family and social isolation.  Patient was tearful throughout this assessment.  Per previous assessment note": She reports ongoing stressors related to family, medical and financial strain.  States she moved from [place] due to declining health and to reestablish her life.  States she was residing with her daughter however her daughter became  upset when she decided to move to [place].  States " my daughter is punishing me and not allowing me to see my grandson."  Reports a fairly close relationship with her grandson however have not been in contact with her daughter since leaving [place].  Reports her daughter has been residing in local homeless shelters due to relationship between her current boyfriend.  Patient reports a strained relationship between she and the other twin daughter.  Patient does endorse passive suicidal ideations. "  Life is not worth living if I do not have a family."  Patient is very tearful and depressed throughout this assessment.  States "I have nothing to live for."  Passive ideations.   Meghan [name] reports recently had surgery to her hand.  Does report a family history with mental illness.  Denies previous inpatient admissions.  Denies alcohol or substance abuse use.  We will follow-up with patient regarding initiating antidepressant for mood stabilization. Patient to start intensive outpatient programming on 01/26/2021  Associated Signs/Symptoms: Depression Symptoms:  depressed mood, feelings of worthlessness/guilt, difficulty concentrating, (Hypo) Manic Symptoms:  Distractibility, Anxiety Symptoms:  Excessive Worry, Psychotic Symptoms:  Hallucinations: None PTSD Symptoms: NA  Past Psychiatric History:   Previous Psychotropic Medications: No   Substance Abuse History in the last 12 months:  No.  Consequences of Substance Abuse: NA  Past Medical History:  Past Medical History:  Diagnosis Date  . Arthritis    left knee  . Asthma   . Atrial fibrillation ([place])   . Atrial fibrillation ([place]) 02/16/2019  . CHF (congestive heart failure) ([place]) 01/2019   Physician from [place] informed her she had this   .  Depression   . Dysrhythmia    Atrial fibrillation  . GERD (gastroesophageal reflux disease)   . Hiatal hernia   . History of pulmonary embolus (PE)   . Hypercholesterolemia   . Macular degeneration    of  left eye  . Osteoarthritis    knees  . Pneumonia    in the past  . Pre-diabetes   . Pulmonary embolism ([place])    bilat  . Seizure ([place])   . Seizure disorder ([place]) 2017  . Sleep apnea CPAP   [facility]  . Stroke [facility])    1989 and 1995 (left sided weakness)  . Vitamin D deficiency     Past Surgical History:  Procedure Laterality Date  . ABDOMINAL HYSTERECTOMY  2003   complete  . CARDIAC CATHETERIZATION  2017   in [facility] in [place]  . CESAREAN SECTION  [id number]   x 2  . CESAREAN SECTION     x 2  . CHOLECYSTECTOMY  2003  . COLONOSCOPY WITH PROPOFOL N/A 06/05/2019   Procedure: COLONOSCOPY WITH PROPOFOL;  Surgeon: Carol Ada, MD;  Location: [facility];  Service: Endoscopy;  Laterality: N/A;  . CYST REMOVAL WITH BONE GRAFT Left 10/18/2020   Procedure: BONE GRAFTING OF ENCHONDROMA MIDDLE [place] OF LEFT MIDDLE FINGER;  Surgeon: Daryll Brod, MD;  Location: [facility];  Service: Orthopedics;  Laterality: Left;  AXILLARY BLOCK  . DIAGNOSTIC LAPAROSCOPY  2015; 2017   lap hernia repair x2  . HERNIA REPAIR  [id number]   umbilical hernia repair  . IVC FILTER INSERTION  2003   Hx pulmonary embolus  . POLYPECTOMY  06/05/2019   Procedure: POLYPECTOMY;  Surgeon: Carol Ada, MD;  Location: [facility];  Service: Endoscopy;;  . SHOULDER ARTHROSCOPY W/ ROTATOR CUFF REPAIR  08/28/2017   right shoulder  . WRIST ARTHROSCOPY WITH DEBRIDEMENT Left 10/18/2020   Procedure: LEFT WRIST ARTHROSCOPY WITH DEBRIDEMENT;  Surgeon: Daryll Brod, MD;  Location: [facility];  Service: Orthopedics;  Laterality: Left;  AXILLARY BLOCK    Family Psychiatric History:   Family History:  Family History  Problem Relation Age of Onset  . Breast cancer Mother   . Breast cancer Maternal Grandmother   . Heart attack Father   . Diabetes Father   . Congestive Heart Failure Father     Social History:   Social History   Socioeconomic History  . Marital status: Divorced    Spouse name: Not on file  .  Number of children: 3  . Years of education: Not on file  . Highest education level: Not on file  Occupational History  . Not on file  Tobacco Use  . Smoking status: Never Smoker  . Smokeless tobacco: Never Used  Vaping Use  . Vaping Use: Never used  Substance and Sexual Activity  . Alcohol use: Never  . Drug use: Never  . Sexual activity: Not on file  Other Topics Concern  . Not on file  Social History Narrative   ** Merged History Encounter **       Social Determinants of Health   Financial Resource Strain: Not on file  Food Insecurity: Not on file  Transportation Needs: Not on file  Physical Activity: Not on file  Stress: Not on file  Social Connections: Not on file    Additional Social History:   Allergies:   Allergies  Allergen Reactions  . Acetaminophen-Codeine Swelling, Rash and Other (See Comments)    Tylenol with Codeine, Tylenol #3,  (facial swelling, hives)  .  Other Hives and Other (See Comments)    Muscle relaxer that starts with a "T" caused hives (not tizanidine) January or February 2020- made the patient VERY sick  . Tramadol Hives and Other (See Comments)    Patient stated "I was trippin' and I do not want that ever again"  . Codeine Swelling    hives  . Coconut Oil Rash and Other (See Comments)    ANY coconut products   . Meloxicam Rash  . Tomato Rash    Metabolic Disorder Labs: Lab Results  Component Value Date   HGBA1C 5.3 11/15/2020   MPG 105.41 11/15/2020   No results found for: PROLACTIN No results found for: CHOL, TRIG, HDL, CHOLHDL, VLDL, LDLCALC Lab Results  Component Value Date   TSH 3.580 11/15/2020    Therapeutic Level Labs: No results found for: LITHIUM No results found for: CBMZ No results found for: VALPROATE  Current Medications: Current Outpatient Medications  Medication Sig Dispense Refill  . acetaminophen (TYLENOL) 500 MG tablet Take 1,000 mg by mouth every 6 (six) hours as needed for moderate pain.    Marland Kitchen  albuterol (VENTOLIN HFA) 108 (90 Base) MCG/ACT inhaler Inhale 2 puffs into the lungs every 6 (six) hours as needed for wheezing or shortness of breath.    Marland Kitchen BREZTRI AEROSPHERE 160-9-4.8 MCG/ACT AERO Inhale 2 puffs into the lungs in the morning and at bedtime.     . celecoxib (CELEBREX) 200 MG capsule Take 200 mg by mouth daily.    . Cholecalciferol (VITAMIN D-3) 25 MCG (1000 UT) CAPS Take 1,000 Units by mouth daily.    . cycloSPORINE (RESTASIS) 0.05 % ophthalmic emulsion Place 1 drop into both eyes 2 (two) times daily.     . flecainide (TAMBOCOR) 50 MG tablet Take 50 mg by mouth 2 (two) times daily.     . furosemide (LASIX) 40 MG tablet Take 40 mg by mouth daily with breakfast.     . levETIRAcetam (KEPPRA) 500 MG tablet Take 1 tablet (500 mg total) by mouth 2 (two) times daily. 180 tablet 3  . meclizine (ANTIVERT) 25 MG tablet Take 1 tablet (25 mg total) by mouth 3 (three) times daily as needed for dizziness. 30 tablet 0  . metFORMIN (GLUCOPHAGE) 500 MG tablet Take 500 mg by mouth daily with breakfast.    . methocarbamol (ROBAXIN) 500 MG tablet Take 1 tablet (500 mg total) by mouth 2 (two) times daily. (Patient taking differently: Take 500 mg by mouth 2 (two) times daily as needed for muscle spasms.) 20 tablet 0  . metoCLOPramide (REGLAN) 5 MG tablet Take 5 mg by mouth 3 (three) times daily.    . Multiple Vitamins-Minerals (PRESERVISION AREDS 2 PO) Take 2 capsules by mouth daily with breakfast.     . Omega-3 Fatty Acids (FISH OIL) 1000 MG CAPS Take 1,000 mg by mouth daily with breakfast.     . omeprazole (PRILOSEC) 40 MG capsule Take 40 mg by mouth daily.    Marland Kitchen OZEMPIC, 1 MG/DOSE, 4 MG/3ML SOPN Inject 1 mg into the skin once a week. Sundays    . rivaroxaban (XARELTO) 20 MG TABS tablet Take 20 mg by mouth at bedtime.     . sertraline (ZOLOFT) 50 MG tablet Take 50 mg by mouth daily.     No current facility-administered medications for this visit.    Musculoskeletal: Strength & Muscle Tone: within  normal limits Gait & Station: normal Patient leans: N/A  Psychiatric Specialty Exam: Review of Systems  Psychiatric/Behavioral: Positive for agitation, decreased concentration and sleep disturbance. Negative for hallucinations. The patient is nervous/anxious.   All other systems reviewed and are negative.   There were no vitals taken for this visit.There is no height or weight on file to calculate BMI.  General Appearance: Casual  Eye Contact:  Good  Speech:  Clear and Coherent  Volume:  Normal  Mood:  Anxious and Depressed  Affect:  Congruent  Thought Process:  Coherent  Orientation:  Full (Time, Place, and Person)  Thought Content:  Logical  Suicidal Thoughts:  No passive ideations   Homicidal Thoughts:  No  Memory:  Immediate;   Fair Recent;   Fair  Judgement:  Fair  Insight:  Good  Psychomotor Activity:  Normal  Concentration:  Concentration: Fair  Recall:  [employer] of Knowledge:Fair  Language: Good  Akathisia:  No  Handed:  Right  AIMS (if indicated):    Assets:  Communication Skills Desire for Improvement Resilience Social Support  ADL's:  Intact  Cognition: WNL  Sleep:  Fair   Screenings: GAD-7   Health and safety inspector from 09/05/2020 in [facility]  Total GAD-7 Score 3    PHQ2-9   Flowsheet Row Counselor from 01/26/2021 in [facility] Counselor from 10/27/2020 in [facility] Counselor from 09/05/2020 in [place]  PHQ-2 Total Score 6 4 0  PHQ-9 Total Score 24 14 --    Flowsheet Row Counselor from 01/26/2021 in [facility] ED from 01/11/2021 in [facility] DEPT ED from 12/14/2020 in [facility] No Risk No Risk No Risk      Assessment and Plan:  Patient to start intensive outpatient programming - will follow-up with patient  regarding medication management -Continue Zoloft 50 mg po daily   Treatment plan was reviewed and agreed upon by NP T. Bobby Rumpf and patient Meghan [name] need for group services   Derrill Center, NP 5/14/20224:10 PM

## 2021-01-30 ENCOUNTER — Other Ambulatory Visit (HOSPITAL_COMMUNITY): Payer: Medicare Other | Admitting: Psychiatry

## 2021-01-30 ENCOUNTER — Other Ambulatory Visit: Payer: Self-pay

## 2021-01-30 ENCOUNTER — Ambulatory Visit (HOSPITAL_COMMUNITY): Payer: Medicare Other | Admitting: Licensed Clinical Social Worker

## 2021-01-30 ENCOUNTER — Encounter (HOSPITAL_COMMUNITY): Payer: Self-pay | Admitting: Family

## 2021-01-30 ENCOUNTER — Encounter (HOSPITAL_COMMUNITY): Payer: Self-pay

## 2021-01-30 DIAGNOSIS — F411 Generalized anxiety disorder: Secondary | ICD-10-CM

## 2021-01-30 DIAGNOSIS — F331 Major depressive disorder, recurrent, moderate: Secondary | ICD-10-CM | POA: Diagnosis not present

## 2021-01-30 NOTE — Progress Notes (Signed)
Virtual Visit via Video Note  I connected with Meghan [name] on 01/27/21 at  9:00 AM EDT by a video enabled telemedicine application and verified that I am speaking with the correct person using two identifiers.  At orientation to the IOP program, Case Manager discussed the limitations of evaluation and management by telemedicine and the availability of in person appointments. The patient expressed understanding and agreed to proceed with virtual visits throughout the duration of the program.   Location:  Patient: Patient Home Provider: Home Office   History of Present Illness: MDD  Observations/Objective: Check In: Case Manager checked in with all participants to review discharge dates, insurance authorizations, work-related documents and needs from the treatment team regarding medications. Client stated needs and engaged in discussion.   Initial Therapeutic Activity: Counselor facilitated a check-in with group members to assess mood and current functioning. Client shared details of their mental health management since our last session, including challenges and successes. Counselor engaged group in discussion, covering the following topics: abandonment, superstitions, following safety plans, incorporating support system in a balanced way, and childhood traumas. Client presents with severe depression and severe anxiety. Client denied any current SI/HI/psychosis. Counselor stayed on call with Client during break and at the end of session, as she was feeling hopeless and helpless due to a family situation. Client refused to be assessed inpatient or have mobile crisis. Client stated she didn't want to talk to anyone and chose to take a nap to "reset". Client to incorporate her sister and children to check in over the weekend.   Second Therapeutic Activity: Counselor closed program by allowing time to celebrate a graduating group members. Counselor shared reflections on progress and allow space for group  members to share well wishes and appreciates to the graduating client. Counselor prompted graduating client to share takeaways, reflect on progress and final thoughts for the group.   Check Out: Counselor prompted group members to share what self-care practice or productivity activity they will engage over the weekend. Client endorsed safety plan to be followed to prevent safety issues.   Assessment and Plan: Clinician recommends that Client remain in IOP treatment to better manage mental health symptoms, stabilization and to address treatment plan goals. Clinician recommends adherence to crisis/safety plan, taking medications as prescribed, and following up with medical professionals if any issues arise.   Follow Up Instructions: Clinician will send Webex link for next session. The Client was advised to call back or seek an in-person evaluation if the symptoms worsen or if the condition fails to improve as anticipated.     I provided 180 minutes of non-face-to-face time during this encounter.     Lise Auer, LCSW

## 2021-01-30 NOTE — Progress Notes (Signed)
Virtual Visit via Video Note  I connected with [name] on 01/30/21 at  9:00 AM EDT by a video enabled telemedicine application and verified that I am speaking with the correct person using two identifiers.  At orientation to the IOP program, Case Manager discussed the limitations of evaluation and management by telemedicine and the availability of in person appointments. The patient expressed understanding and agreed to proceed with virtual visits throughout the duration of the program.   Location:  Patient: Patient Home Provider: Home Office   History of Present Illness: MDD and GAD  Observations/Objective: Check In: Case Manager checked in with all participants to review discharge dates, insurance authorizations, work-related documents and needs from the treatment team regarding medications. Client stated needs and engaged in discussion.   Initial Therapeutic Activity: Counselor facilitated a check-in with group members to assess mood and current functioning. Client shared details of their mental health management since our last session, including challenges and successes. Counselor engaged group in discussion, covering the following topics: stages and tasks of development, healing from trauma, supporting others with MH issues, and healthy coping skills. Client presents with moderate depression and moderate anxiety. Client denied any current SI/HI/psychosis. Client stated that her mood and functioning improved over the weekend, as she repaired relationship with family members, addressing issues directly.  Second Therapeutic Activity: Counselor presented information on Cognitive Distortions. Client shared a video outlining 15 styles of cognitive distortions. Group members shared which ones they do most in their thought processes. Client related to jumping to conclusions and mind reading. Counselor shared 5 strategies for combating distorted thinking. Counselor shared links for resources for  Group Members to review for homework.  Check Out: Counselor prompted group members to share what self-care practice or productivity activity they will engage today. Client endorsed safety plan to be followed to prevent safety issues.   Assessment and Plan: Clinician recommends that Client remain in IOP treatment to better manage mental health symptoms, stabilization and to address treatment plan goals. Clinician recommends adherence to crisis/safety plan, taking medications as prescribed, and following up with medical professionals if any issues arise.   Follow Up Instructions: Clinician will send Webex link for next session. The Client was advised to call back or seek an in-person evaluation if the symptoms worsen or if the condition fails to improve as anticipated.     I provided 180 minutes of non-face-to-face time during this encounter.     Lise Auer, LCSW

## 2021-01-31 ENCOUNTER — Other Ambulatory Visit: Payer: Self-pay

## 2021-01-31 ENCOUNTER — Other Ambulatory Visit (HOSPITAL_COMMUNITY): Payer: Medicare Other

## 2021-01-31 NOTE — Procedures (Signed)
   HISTORY:  TECHNIQUE:  This is a routine 16 channel EEG recording with one channel devoted to a limited EKG recording.  It was performed during wakefulness, drowsiness and asleep.  Hyperventilation and photic stimulation were performed as activating procedures.  There are minimum muscle and movement artifact noted.  Upon maximum arousal, posterior dominant waking rhythm consistent of rhythmic alpha range activity, with frequency of 9 hz. Activities are symmetric over the bilateral posterior derivations and attenuated with eye opening.  Hyperventilation produced mild/moderate buildup with higher amplitude and the slower activities noted.  Photic stimulation did not alter the tracing.  During EEG recording, patient developed drowsiness and no deeper stage of sleep was achieved  During EEG recording, there was no epileptiform discharge noted.  EKG demonstrate sinus rhythm, with heart rate of 68 bpm  CONCLUSION: This is a  normal awake EEG.  There is no electrodiagnostic evidence of epileptiform discharge.  Marcial Pacas, M.D. Ph.D.  [facility] [place], [place] [address] Phone: [phone] Fax:      [phone]

## 2021-02-01 ENCOUNTER — Emergency Department (HOSPITAL_COMMUNITY): Payer: Medicare Other

## 2021-02-01 ENCOUNTER — Other Ambulatory Visit (HOSPITAL_COMMUNITY): Payer: Medicare Other

## 2021-02-01 ENCOUNTER — Emergency Department (HOSPITAL_COMMUNITY)
Admission: EM | Admit: 2021-02-01 | Discharge: 2021-02-01 | Disposition: A | Discharge status: Home / Self Care | Payer: Medicare Other | Attending: Emergency Medicine | Admitting: Emergency Medicine

## 2021-02-01 ENCOUNTER — Other Ambulatory Visit: Payer: Self-pay

## 2021-02-01 ENCOUNTER — Encounter (HOSPITAL_COMMUNITY): Payer: Self-pay | Admitting: Emergency Medicine

## 2021-02-01 DIAGNOSIS — M25512 Pain in left shoulder: Secondary | ICD-10-CM | POA: Insufficient documentation

## 2021-02-01 DIAGNOSIS — Z7901 Long term (current) use of anticoagulants: Secondary | ICD-10-CM | POA: Diagnosis not present

## 2021-02-01 DIAGNOSIS — E119 Type 2 diabetes mellitus without complications: Secondary | ICD-10-CM | POA: Diagnosis not present

## 2021-02-01 DIAGNOSIS — I4891 Unspecified atrial fibrillation: Secondary | ICD-10-CM | POA: Insufficient documentation

## 2021-02-01 DIAGNOSIS — Z7984 Long term (current) use of oral hypoglycemic drugs: Secondary | ICD-10-CM | POA: Insufficient documentation

## 2021-02-01 DIAGNOSIS — I509 Heart failure, unspecified: Secondary | ICD-10-CM | POA: Insufficient documentation

## 2021-02-01 DIAGNOSIS — R079 Chest pain, unspecified: Secondary | ICD-10-CM | POA: Insufficient documentation

## 2021-02-01 DIAGNOSIS — J45909 Unspecified asthma, uncomplicated: Secondary | ICD-10-CM | POA: Insufficient documentation

## 2021-02-01 LAB — BASIC METABOLIC PANEL
Anion gap: 6 (ref 5–15)
BUN: 12 mg/dL (ref 6–20)
CO2: 28 mmol/L (ref 22–32)
Calcium: 9.9 mg/dL (ref 8.9–10.3)
Chloride: 103 mmol/L (ref 98–111)
Creatinine, Ser: 0.83 mg/dL (ref 0.44–1.00)
GFR, Estimated: >60 mL/min (ref >60)
Glucose, Bld: 90 mg/dL (ref 70–99)
Potassium: 3.9 mmol/L (ref 3.5–5.1)
Sodium: 137 mmol/L (ref 135–145)

## 2021-02-01 LAB — CBC
HCT: 36.2 % (ref 36.0–46.0)
Hemoglobin: 11.4 g/dL — ABNORMAL LOW (ref 12.0–15.0)
MCH: 26.4 pg (ref 26.0–34.0)
MCHC: 31.5 g/dL (ref 30.0–36.0)
MCV: 83.8 fL (ref 80.0–100.0)
Platelets: 264 10*3/uL (ref 150–400)
RBC: 4.32 MIL/uL (ref 3.87–5.11)
RDW: 15.3 % (ref 11.5–15.5)
WBC: 6 10*3/uL (ref 4.0–10.5)
nRBC: 0 % (ref 0.0–0.2)

## 2021-02-01 LAB — TROPONIN I (HIGH SENSITIVITY)
Troponin I (High Sensitivity): 2 ng/L (ref <18)
Troponin I (High Sensitivity): <2 ng/L (ref <18)

## 2021-02-01 LAB — BRAIN NATRIURETIC PEPTIDE: B Natriuretic Peptide: 92.4 pg/mL (ref 0.0–100.0)

## 2021-02-01 MED ORDER — ASPIRIN 81 MG PO CHEW
324.0000 mg | CHEWABLE_TABLET | Freq: Once | ORAL | Status: AC
  Administered 2021-02-01: 324 mg via ORAL
  Filled 2021-02-01: qty 4

## 2021-02-01 MED ORDER — METOCLOPRAMIDE HCL 5 MG/ML IJ SOLN
10.0000 mg | Freq: Once | INTRAMUSCULAR | Status: AC
  Administered 2021-02-01: 10 mg via INTRAVENOUS
  Filled 2021-02-01: qty 2

## 2021-02-01 MED ORDER — DIPHENHYDRAMINE HCL 25 MG PO CAPS
25.0000 mg | ORAL_CAPSULE | Freq: Once | ORAL | Status: AC
  Administered 2021-02-01: 25 mg via ORAL
  Filled 2021-02-01: qty 1

## 2021-02-01 NOTE — ED Provider Notes (Signed)
[facility] EMERGENCY DEPARTMENT Provider Note   CSN: [id number] Arrival date & time: 02/01/21  1026     History No chief complaint on file.   Meghan [name] is a 55 y.o. female.  HPI Patient is a 55 year old female with past medical history significant for CHF, A. fib, history of PE, seizure, sleep apnea  Patient is here with complaints of left-sided chest pain that she states has been ongoing since she woke up yesterday.  She states that this was burning and felt like reflux yesterday but today feels like pressure.  Some mild radiation to her left shoulder but she states that she also has some shoulder pain that may be unrelated.  She denies any nausea, vomiting or shortness of breath.  She states she has a history of CHF and is followed at outside hospital.  She states the pain is achy seems to be constant has not waxed or waned significantly.  Was somewhat worse of eating earlier.  Denies significant pleuritic or exertional symptoms.  I was able to request this information via fax.  She has a cardiologist that she follows with.  No recent surgeries, hospitalization, long travel, hemoptysis, estrogen containing OCP, cancer history.  No unilateral leg swelling.  No history of PE or VTE.   She is also complaining of circumferential headache for the past day or 2.  She states it is achy constant with no associated nausea vomiting vision changes loss of consciousness or near syncope.    Past Medical History:  Diagnosis Date  . Arthritis    left knee  . Asthma   . Atrial fibrillation ([place])   . Atrial fibrillation ([place]) 02/16/2019  . CHF (congestive heart failure) ([place]) 01/2019   Physician from [place] informed her she had this   . Depression   . Dysrhythmia    Atrial fibrillation  . GERD (gastroesophageal reflux disease)   . Hiatal hernia   . History of pulmonary embolus (PE)   . Hypercholesterolemia   . Macular degeneration    of left eye  . Osteoarthritis     knees  . Pneumonia    in the past  . Pre-diabetes   . Pulmonary embolism ([place])    bilat  . Seizure ([place])   . Seizure disorder ([place]) 2017  . Sleep apnea CPAP   [facility]  . Stroke [facility])    1989 and 1995 (left sided weakness)  . Vitamin D deficiency     Patient Active Problem List   Diagnosis Date Noted  . Intractable nausea and vomiting 11/16/2020  . Ileus ([place]) 11/14/2020  . Fecal impaction ([place]) 11/14/2020  . Left-sided weakness 09/29/2020  . Asthma 09/29/2020  . Pre-diabetes 09/29/2020  . Pain in left finger(s) 07/12/2020  . Seizure-like activity ([place]) 03/25/2020  . History of head injury 03/25/2020  . Obstructive sleep apnea syndrome 09/04/2019  . Acute medial meniscus tear of right knee 07/10/2019  . Class 3 severe obesity due to excess calories with serious comorbidity and body mass index (BMI) of 50.0 to 59.9 in adult ([place]) 06/01/2019  . Atrial fibrillation ([place]) 04/17/2019  . History of pulmonary embolus (PE) 04/17/2019  . Type 2 diabetes mellitus, without long-term current use of insulin ([place]) 04/17/2019    Past Surgical History:  Procedure Laterality Date  . ABDOMINAL HYSTERECTOMY  2003   complete  . CARDIAC CATHETERIZATION  2017   in [facility] in [place]  . CESAREAN SECTION  [id number]   x 2  .  CESAREAN SECTION     x 2  . CHOLECYSTECTOMY  2003  . COLONOSCOPY WITH PROPOFOL N/A 06/05/2019   Procedure: COLONOSCOPY WITH PROPOFOL;  Surgeon: Carol Ada, MD;  Location: [facility];  Service: Endoscopy;  Laterality: N/A;  . CYST REMOVAL WITH BONE GRAFT Left 10/18/2020   Procedure: BONE GRAFTING OF ENCHONDROMA MIDDLE [place] OF LEFT MIDDLE FINGER;  Surgeon: Daryll Brod, MD;  Location: [place];  Service: Orthopedics;  Laterality: Left;  AXILLARY BLOCK  . DIAGNOSTIC LAPAROSCOPY  2015; 2017   lap hernia repair x2  . HERNIA REPAIR  3790, 2409   umbilical hernia repair  . IVC FILTER INSERTION  2003   Hx pulmonary embolus  . POLYPECTOMY  06/05/2019    Procedure: POLYPECTOMY;  Surgeon: Carol Ada, MD;  Location: [facility];  Service: Endoscopy;;  . SHOULDER ARTHROSCOPY W/ ROTATOR CUFF REPAIR  08/28/2017   right shoulder  . WRIST ARTHROSCOPY WITH DEBRIDEMENT Left 10/18/2020   Procedure: LEFT WRIST ARTHROSCOPY WITH DEBRIDEMENT;  Surgeon: Daryll Brod, MD;  Location: [facility];  Service: Orthopedics;  Laterality: Left;  AXILLARY BLOCK     OB History   No obstetric history on file.     Family History  Problem Relation Age of Onset  . Breast cancer Mother   . Breast cancer Maternal Grandmother   . Heart attack Father   . Diabetes Father   . Congestive Heart Failure Father     Social History   Tobacco Use  . Smoking status: Never Smoker  . Smokeless tobacco: Never Used  Vaping Use  . Vaping Use: Never used  Substance Use Topics  . Alcohol use: Never  . Drug use: Never    Home Medications Prior to Admission medications   Medication Sig Start Date End Date Taking? Authorizing Provider  acetaminophen (TYLENOL) 500 MG tablet Take 1,000 mg by mouth every 6 (six) hours as needed for moderate pain.    [provider]  albuterol (VENTOLIN HFA) 108 (90 Base) MCG/ACT inhaler Inhale 2 puffs into the lungs every 6 (six) hours as needed for wheezing or shortness of breath.    [provider]  BREZTRI AEROSPHERE 160-9-4.8 MCG/ACT AERO Inhale 2 puffs into the lungs in the morning and at bedtime.  03/24/20   [provider]  celecoxib (CELEBREX) 200 MG capsule Take 200 mg by mouth daily. 01/02/21   [provider]  Cholecalciferol (VITAMIN D-3) 25 MCG (1000 UT) CAPS Take 1,000 Units by mouth daily.    [provider]  cycloSPORINE (RESTASIS) 0.05 % ophthalmic emulsion Place 1 drop into both eyes 2 (two) times daily.     [provider]  flecainide (TAMBOCOR) 50 MG tablet Take 50 mg by mouth 2 (two) times daily.  09/04/19   [provider]  furosemide (LASIX) 40 MG tablet Take 40 mg by  mouth daily with breakfast.     [provider]  levETIRAcetam (KEPPRA) 500 MG tablet Take 1 tablet (500 mg total) by mouth 2 (two) times daily. 12/15/20   Marcial Pacas, MD  meclizine (ANTIVERT) 25 MG tablet Take 1 tablet (25 mg total) by mouth 3 (three) times daily as needed for dizziness. 01/11/21   Domenic Moras, PA-C  metFORMIN (GLUCOPHAGE) 500 MG tablet Take 500 mg by mouth daily with breakfast.    [provider]  methocarbamol (ROBAXIN) 500 MG tablet Take 1 tablet (500 mg total) by mouth 2 (two) times daily. Patient taking differently: Take 500 mg by mouth 2 (two) times  daily as needed for muscle spasms. 12/14/20   Tedd Sias, PA  metoCLOPramide (REGLAN) 5 MG tablet Take 5 mg by mouth 3 (three) times daily. 01/02/21   [provider]  Multiple Vitamins-Minerals (PRESERVISION AREDS 2 PO) Take 2 capsules by mouth daily with breakfast.     [provider]  Omega-3 Fatty Acids (FISH OIL) 1000 MG CAPS Take 1,000 mg by mouth daily with breakfast.     [provider]  omeprazole (PRILOSEC) 40 MG capsule Take 40 mg by mouth daily. 08/17/20   [provider]  OZEMPIC, 1 MG/DOSE, 4 MG/3ML SOPN Inject 1 mg into the skin once a week. Sundays 10/24/20   [provider]  rivaroxaban (XARELTO) 20 MG TABS tablet Take 20 mg by mouth at bedtime.     [provider]  sertraline (ZOLOFT) 50 MG tablet Take 50 mg by mouth daily. 01/01/21   [provider]    Allergies    Acetaminophen-codeine, Other, Tramadol, Codeine, Coconut oil, Meloxicam, and Tomato  Review of Systems   Review of Systems  Constitutional: Negative for chills and fever.  HENT: Negative for congestion.   Eyes: Negative for pain.  Respiratory: Negative for cough and shortness of breath.   Cardiovascular: Positive for chest pain. Negative for leg swelling.  Gastrointestinal: Negative for abdominal pain, diarrhea, nausea and vomiting.  Genitourinary: Negative for  dysuria.  Musculoskeletal: Negative for myalgias.  Skin: Negative for rash.  Neurological: Negative for dizziness and headaches.    Physical Exam Updated Vital Signs BP 106/73 (BP Location: Right Wrist)   Pulse 72   Temp 98.1 F (36.7 C) (Oral)   Resp 18   SpO2 100%   Physical Exam Vitals and nursing note reviewed.  Constitutional:      General: She is not in acute distress. HENT:     Head: Normocephalic and atraumatic.     Nose: Nose normal.     Mouth/Throat:     Mouth: Mucous membranes are moist.  Eyes:     General: No scleral icterus. Cardiovascular:     Rate and Rhythm: Normal rate and regular rhythm.     Pulses: Normal pulses.     Heart sounds: Normal heart sounds.  Pulmonary:     Effort: Pulmonary effort is normal. No respiratory distress.     Breath sounds: No wheezing.  Abdominal:     Palpations: Abdomen is soft.     Tenderness: There is no abdominal tenderness. There is no guarding or rebound.  Musculoskeletal:     Cervical back: Normal range of motion.     Right lower leg: No edema.     Left lower leg: No edema.     Comments: No lower extremity edema no calf tenderness  Skin:    General: Skin is warm and dry.     Capillary Refill: Capillary refill takes less than 2 seconds.  Neurological:     Mental Status: She is alert. Mental status is at baseline.  Psychiatric:        Mood and Affect: Mood normal.        Behavior: Behavior normal.     ED Results / Procedures / Treatments   Labs (all labs ordered are listed, but only abnormal results are displayed) Labs Reviewed  CBC - Abnormal; Notable for the following components:      Result Value   Hemoglobin 11.4 (*)    All other components within normal limits  BASIC METABOLIC PANEL  BRAIN NATRIURETIC PEPTIDE  TROPONIN I (HIGH SENSITIVITY)  TROPONIN I (HIGH SENSITIVITY)    EKG EKG Interpretation  Date/Time:  Wednesday Feb 01 2021 10:52:55 EDT Ventricular Rate:  63 PR Interval:  132 QRS  Duration: 96 QT Interval:  418 QTC Calculation: 427 R Axis:   13 Text Interpretation: Normal sinus rhythm Non-specific ST-t changes Confirmed by Lajean Saver [id number]) on 02/02/2021 3:59:14 PM   Radiology No results found.  Procedures Procedures   Medications Ordered in ED Medications  metoCLOPramide (REGLAN) injection 10 mg (10 mg Intravenous Given 02/01/21 1236)  diphenhydrAMINE (BENADRYL) capsule 25 mg (25 mg Oral Given 02/01/21 1236)  aspirin chewable tablet 324 mg (324 mg Oral Given 02/01/21 1235)    ED Course  I have reviewed the triage vital signs and the nursing notes.  Pertinent labs & imaging results that were available during my care of the patient were reviewed by me and considered in my medical decision making (see chart for details).  Patient is a well-appearing 55 year old female presented today with chest pain.  I was able to review patient's prior cardiology notes and it appears that her CHF is well controlled with a relatively normal EF and she is managed by outpatient cardiology.  She is having some sternal chest pain.  No consistent aggravating or mitigating factors and she has taken no medications prior to arrival.  There is some consideration given to reflux given that she has a history of this however it is atypical for ACS and reflux.  Troponin x2 undetectable.  BMP within normal limits.  I personally reviewed all laboratory work and imaging.  Metabolic panel without any acute abnormality specifically kidney function within normal limits and no significant electrolyte abnormalities. CBC without leukocytosis or significant anemia.   Chest x-ray independently reviewed agree of radiology is unremarkable.  EKG reviewed by myself and attending physician.  Normal sinus rhythm.  No ST-T wave abnormalities of note.  No significant axis deviations.  Clinical Course as of 02/03/21 2205  Wed Feb 01, 2021  1237 Patient is 55 year old female with past medical history of  CAD [WF]    Clinical Course User Index [WF] Tedd Sias, [place]   MDM Rules/Calculators/A&P                          Discussed case my attending physician given patient does have some risk factors for ACS however she does have close outpatient follow-up to negative troponins and reassuring EKG.  Patient feels much improved from a headache standpoint after Reglan Benadryl.  Will provide patient with return precautions.  She will follow-up with cardiology within the next week.  Final Clinical Impression(s) / ED Diagnoses Final diagnoses:  Chest pain, unspecified type    Rx / DC Orders ED Discharge Orders    None       Tedd Sias, [place] 02/03/21 2209    Valarie Merino, MD 02/06/21 [phone]

## 2021-02-01 NOTE — Discharge Instructions (Addendum)
Please follow up with your cardiologist.  Return to the ER for any new or concerning symptoms.  I recommend you keep the information that I have printed for you including your most recent EKGs with you that way you are able to give [name] information to whoever sees you next.  The 2 EKGs that I printed for you from today's visit should be reviewed by her cardiologist.  Please drink plenty of water.  You may return to the ER at any time.  Monitor your symptoms so that you are able to tell your cardiologist what you have experienced over the past 2 days.

## 2021-02-01 NOTE — ED Notes (Signed)
Pt ambulated well to the bathroom  

## 2021-02-01 NOTE — ED Triage Notes (Signed)
Pt here from home with c/o chest pain left and right side , no n/v or sob , pt does have chf

## 2021-02-02 ENCOUNTER — Other Ambulatory Visit: Payer: Self-pay

## 2021-02-02 ENCOUNTER — Other Ambulatory Visit (HOSPITAL_COMMUNITY): Payer: Medicare Other

## 2021-02-03 ENCOUNTER — Other Ambulatory Visit (HOSPITAL_COMMUNITY): Payer: Medicare Other | Admitting: Psychiatry

## 2021-02-03 ENCOUNTER — Encounter (HOSPITAL_COMMUNITY): Payer: Self-pay | Admitting: Psychiatry

## 2021-02-03 ENCOUNTER — Other Ambulatory Visit: Payer: Self-pay

## 2021-02-03 DIAGNOSIS — F331 Major depressive disorder, recurrent, moderate: Secondary | ICD-10-CM

## 2021-02-03 NOTE — Progress Notes (Signed)
Virtual Visit via Video Note  I connected with Meghan [name] on 02/03/21 at  9:00 AM EDT by a video enabled telemedicine application and verified that I am speaking with the correct person using two identifiers.  At orientation to the IOP program, Case Manager discussed the limitations of evaluation and management by telemedicine and the availability of in person appointments. The patient expressed understanding and agreed to proceed with virtual visits throughout the duration of the program.   Location:  Patient: Patient Home Provider: Home Office   History of Present Illness: Bipolar 1 DO  Observations/Objective: Check In: Case Manager checked in with all participants to review discharge dates, insurance authorizations, work-related documents and needs from the treatment team regarding medications. Client stated needs and engaged in discussion.   Initial Therapeutic Activity: Counselor facilitated a check-in with group members to assess mood and current functioning. Client shared details of their mental health management since our last session, including challenges and successes. Counselor engaged group in discussion, covering the following topics: depression symptoms, specific treatments for trauma, volunteering, right therapist, right medicines, coping strategies in overcoming addictions, and boundary setting. Client presents with moderate depression and moderate anxiety. Client denied any current SI/HI/psychosis.   Second Therapeutic Activity: Counselor provided resources on coping skill application and 3 exhaustive lists of coping skills to utilize in a variety of settings and scenarios. Counselor prompted group members to identify coping skills they can use to better manage mental health symptoms. Counselor provided a therapeutic video by [employer] on the purpose of coping skills to calm the central nervous system in order to be more capable in dealing with stressors directly.  Group engaged in discussion about takeaways and application.   Check Out: Counselor prompted group members to share what self-care practice or productivity activity they will engage in over the weekend. Group members shared their plans with the group. Client endorsed safety plan to be followed to prevent safety issues.   Assessment and Plan: Clinician recommends that Client remain in IOP treatment to better manage mental health symptoms, stabilization and to address treatment plan goals. Clinician recommends adherence to crisis/safety plan, taking medications as prescribed, and following up with medical professionals if any issues arise.    Follow Up Instructions: Clinician will send Webex link for next session. The Client was advised to call back or seek an in-person evaluation if the symptoms worsen or if the condition fails to improve as anticipated.     I provided 180 minutes of non-face-to-face time during this encounter.     Lise Auer, LCSW

## 2021-02-06 ENCOUNTER — Other Ambulatory Visit: Payer: Self-pay

## 2021-02-06 ENCOUNTER — Other Ambulatory Visit (HOSPITAL_COMMUNITY): Payer: Medicare Other | Admitting: Psychiatry

## 2021-02-06 ENCOUNTER — Encounter (HOSPITAL_COMMUNITY): Payer: Self-pay

## 2021-02-06 ENCOUNTER — Ambulatory Visit (HOSPITAL_COMMUNITY): Payer: Medicare Other | Admitting: Licensed Clinical Social Worker

## 2021-02-06 DIAGNOSIS — F331 Major depressive disorder, recurrent, moderate: Secondary | ICD-10-CM

## 2021-02-06 DIAGNOSIS — F411 Generalized anxiety disorder: Secondary | ICD-10-CM

## 2021-02-06 NOTE — Progress Notes (Signed)
Virtual Visit via Video Note  I connected with Meghan [name] on 02/06/21 at  9:00 AM EDT by a video enabled telemedicine application and verified that I am speaking with the correct person using two identifiers.  At orientation to the IOP program, Case Manager discussed the limitations of evaluation and management by telemedicine and the availability of in person appointments. The patient expressed understanding and agreed to proceed with virtual visits throughout the duration of the program.   Location:  Patient: Patient Home Provider: Home Office   History of Present Illness: MDD and GAD  Observations/Objective: Check In: Case Manager checked in with all participants to review discharge dates, insurance authorizations, work-related documents and needs from the treatment team regarding medications. Client stated needs and engaged in discussion.   Initial Therapeutic Activity: Counselor facilitated a check-in with group members to assess mood and current functioning. Client shared details of their mental health management since our last session, including challenges and successes. Counselor engaged group in discussion, covering the following topics: mental health in relation to religion/spirituality, support systems, mental health and family planning, coping strategies, and follow up care. Client presents with moderate depression and moderate anxiety. Client denied any current SI/HI/psychosis.   Second Therapeutic Activity: Counselor engaged group in discussion on current self-compassion practices. Counselor provided psychoeducation on self-compassion. Counselor provided group with a self-assessment, then discussed scores and recommendations as a group. Client identified with challenges regarding practicing self-compassion. Counselor facilitated a self-compassion practice of guided soothing touch. Client identified which soothing touch was most effective for her.   Check Out: Counselor prompted  group members to share what self-care practice or productivity activity they will engage in today. Group members shared their plans with the group. Client endorsed safety plan to be followed to prevent safety issues.   Assessment and Plan: Clinician recommends that Client remain in IOP treatment to better manage mental health symptoms, stabilization and to address treatment plan goals. Clinician recommends adherence to crisis/safety plan, taking medications as prescribed, and following up with medical professionals if any issues arise.    Follow Up Instructions: Clinician will send Webex link for next session. The Client was advised to call back or seek an in-person evaluation if the symptoms worsen or if the condition fails to improve as anticipated.     I provided 180 minutes of non-face-to-face time during this encounter.     Lise Auer, LCSW

## 2021-02-07 ENCOUNTER — Other Ambulatory Visit (HOSPITAL_COMMUNITY): Payer: Medicare Other

## 2021-02-07 ENCOUNTER — Other Ambulatory Visit: Payer: Self-pay

## 2021-02-08 ENCOUNTER — Other Ambulatory Visit: Payer: Self-pay

## 2021-02-08 ENCOUNTER — Other Ambulatory Visit (HOSPITAL_COMMUNITY): Payer: Medicare Other | Admitting: Psychiatry

## 2021-02-08 DIAGNOSIS — F331 Major depressive disorder, recurrent, moderate: Secondary | ICD-10-CM

## 2021-02-09 ENCOUNTER — Other Ambulatory Visit (HOSPITAL_COMMUNITY): Payer: Medicare Other

## 2021-02-09 ENCOUNTER — Encounter (HOSPITAL_COMMUNITY): Payer: Self-pay

## 2021-02-09 ENCOUNTER — Other Ambulatory Visit: Payer: Self-pay

## 2021-02-09 NOTE — Progress Notes (Signed)
Virtual Visit via Video Note  I connected with Meghan [name] on 02/08/21 at  9:00 AM EDT by a video enabled telemedicine application and verified that I am speaking with the correct person using two identifiers.  At orientation to the IOP program, Case Manager discussed the limitations of evaluation and management by telemedicine and the availability of in person appointments. The patient expressed understanding and agreed to proceed with virtual visits throughout the duration of the program.   Location:  Patient: Patient Home Provider: Home Office   History of Present Illness: MDD  Observations/Objective: Check In: Case Manager checked in with all participants to review discharge dates, insurance authorizations, work-related documents and needs from the treatment team regarding medications. Client stated needs and engaged in discussion. Case Manager introduced new Client to the group, with group members welcoming and starting the joining process.   Initial Therapeutic Activity: Counselor facilitated a check-in with group members to assess mood and current functioning. Client shared details of their mental health management since our last session, including challenges and successes. Counselor engaged group in discussion, covering the following topics: relational issues, sex drive/sexual health issues and trauma, . Client presents with moderate depression and moderate anxiety. Client denied any current SI/HI/psychosis.   Second Therapeutic Activity: Counselor continued psychoeducation on boundaries with the group sharing a handout on personal boundaries and the boundary types. Counselor prompted group members to share where they are rigid, porous and health in their boundaries. Group engaged in discussion, sharing specific examples. Counselor shared additional information on boundary areas, such as emotional, material, sexual, intellectual, time and physical. Counselor shared resources on how to use  additional worksheets to further improve their boundary application for homework.   Check Out: Counselor closed program by allowing time to celebrate a graduating group member. Counselor shared reflections on progress and allow space for group members to share well wishes and appreciates to the graduating client. Counselor prompted graduating client to share takeaways, reflect on progress and final thoughts for the group. Counselor prompted group members to share what self-care practice or productivity activity they will engage in today. Group members shared their plans with the group. Client endorsed safety plan to be followed to prevent safety issues.   Assessment and Plan: Clinician recommends that Client remain in IOP treatment to better manage mental health symptoms, stabilization and to address treatment plan goals. Clinician recommends adherence to crisis/safety plan, taking medications as prescribed, and following up with medical professionals if any issues arise.    Follow Up Instructions: Clinician will send Webex link for next session. The Client was advised to call back or seek an in-person evaluation if the symptoms worsen or if the condition fails to improve as anticipated.     I provided 180 minutes of non-face-to-face time during this encounter.     Lise Auer, LCSW

## 2021-02-10 ENCOUNTER — Other Ambulatory Visit (HOSPITAL_COMMUNITY): Payer: Medicare Other | Admitting: Psychiatry

## 2021-02-10 ENCOUNTER — Other Ambulatory Visit: Payer: Self-pay

## 2021-02-10 DIAGNOSIS — F331 Major depressive disorder, recurrent, moderate: Secondary | ICD-10-CM | POA: Diagnosis not present

## 2021-02-14 ENCOUNTER — Other Ambulatory Visit: Payer: Self-pay

## 2021-02-14 ENCOUNTER — Ambulatory Visit (HOSPITAL_COMMUNITY): Payer: Medicare Other | Admitting: Licensed Clinical Social Worker

## 2021-02-14 ENCOUNTER — Other Ambulatory Visit (HOSPITAL_COMMUNITY): Payer: Medicare Other

## 2021-02-15 ENCOUNTER — Other Ambulatory Visit (HOSPITAL_COMMUNITY): Payer: Medicare Other | Admitting: Psychiatry

## 2021-02-15 ENCOUNTER — Other Ambulatory Visit: Payer: Self-pay

## 2021-02-15 ENCOUNTER — Telehealth (HOSPITAL_COMMUNITY): Payer: Self-pay | Admitting: Psychiatry

## 2021-02-15 NOTE — Telephone Encounter (Signed)
D:  Pt didn't attend virtual MH-IOP today d/t having pain going down her leg.  A:  Pt encouraged to call 911 as soon as possible.  Informed Tanika Lewis,NP.  Put pt down as excused today; possibly discharge tomorrow since pt only has only one more authorized day.

## 2021-02-16 ENCOUNTER — Ambulatory Visit (HOSPITAL_COMMUNITY): Payer: Medicare Other | Admitting: Licensed Clinical Social Worker

## 2021-02-16 ENCOUNTER — Telehealth (HOSPITAL_COMMUNITY): Payer: Self-pay | Admitting: Psychiatry

## 2021-02-16 ENCOUNTER — Encounter (HOSPITAL_COMMUNITY): Payer: Self-pay

## 2021-02-16 NOTE — Progress Notes (Signed)
Virtual Visit via Video Note  I connected with Meghan [name] on 02/10/21 at  9:00 AM EDT by a video enabled telemedicine application and verified that I am speaking with the correct person using two identifiers.  At orientation to the IOP program, Case Manager discussed the limitations of evaluation and management by telemedicine and the availability of in person appointments. The patient expressed understanding and agreed to proceed with virtual visits throughout the duration of the program.   Location:  Patient: Patient Home Provider: Home Office   History of Present Illness: MDD  Observations/Objective: Check In: Case Manager checked in with all participants to review discharge dates, insurance authorizations, work-related documents and needs from the treatment team regarding medications. Client stated needs and engaged in discussion.   Initial Therapeutic Activity: Counselor facilitated a check-in with group members to assess mood and current functioning. Client shared details of their mental health management since our last session, including challenges and successes. Counselor engaged group in discussion, covering the following topics: family conflict, boundary setting, applying coping skills, grief and loss and assertive communication. Client presents with moderate depression and moderate anxiety. Client denied any current SI/HI/psychosis.  Second Therapeutic Activity: Counselor introduced [employer], representative with [employer] to share about programming. Group Members asked questions and engaged in discussion, as [name] shared about Peer Support, Support Groups and the [employer]. Client stated that they are interested in connecting with the peer support, support groups and classes.  Check Out: Counselor closed program by allowing time to celebrate a graduating group member. Counselor shared reflections on progress and allow space for group members to share well wishes and  appreciates to the graduating client. Counselor prompted graduating client to share takeaways, reflect on progress and final thoughts for the group. Counselor prompted group members to share what self-care practice or productivity activity they will engage in over the weekend. Group members shared their plans with the group. Client endorsed safety plan to be followed to prevent safety issues.   Assessment and Plan: Clinician recommends that Client remain in IOP treatment to better manage mental health symptoms, stabilization and to address treatment plan goals. Clinician recommends adherence to crisis/safety plan, taking medications as prescribed, and following up with medical professionals if any issues arise.    Follow Up Instructions: Clinician will send Webex link for next session. The Client was advised to call back or seek an in-person evaluation if the symptoms worsen or if the condition fails to improve as anticipated.     I provided 180 minutes of non-face-to-face time during this encounter.     Lise Auer, LCSW

## 2021-02-16 NOTE — Telephone Encounter (Signed)
D:  Placed call to check on pt since she was taken by ambulance yesterday.  On 02-15-21, pt c/o pain down her left side and had mentioned that she couldn't move.  According to pt, the ED states she had pulled a muscle in her neck.  They provided her with a muscle relaxant and encouraged her to purchase a neck pillow.  Stroke was also ruled out.  Pt states she is feeling better besides neck being a little stiff.  Pt plans to go to get her hair done as scheduled this afternoon.  A:  Discharge pt as scheduled tomorrow.  F/U with [name], LCSW on 02-20-21 and Dr. Altamese Hollister.  Strongly encourage DBT.

## 2021-02-17 ENCOUNTER — Encounter (HOSPITAL_COMMUNITY): Payer: Self-pay | Admitting: Family

## 2021-02-17 ENCOUNTER — Other Ambulatory Visit (HOSPITAL_COMMUNITY): Payer: Medicare Other | Attending: Psychiatry | Admitting: Psychiatry

## 2021-02-17 ENCOUNTER — Other Ambulatory Visit: Payer: Self-pay

## 2021-02-17 DIAGNOSIS — F331 Major depressive disorder, recurrent, moderate: Secondary | ICD-10-CM

## 2021-02-17 NOTE — Progress Notes (Signed)
Virtual Visit via Video Note  I connected with Meghan [name] on 02/17/21 at  9:00 AM EDT by a video enabled telemedicine application and verified that I am speaking with the correct person using two identifiers.  At orientation to the IOP program, Case Manager discussed the limitations of evaluation and management by telemedicine and the availability of in person appointments. The patient expressed understanding and agreed to proceed with virtual visits throughout the duration of the program.   Location:  Patient: Patient Home Provider: Home Office   History of Present Illness: GAD  Observations/Objective: Check In: Case Manager checked in with all participants to review discharge dates, insurance authorizations, work-related documents and needs from the treatment team regarding medications. Client stated needs and engaged in discussion.   Initial Therapeutic Activity: Counselor facilitated a check-in with group members to assess mood and current functioning. Client shared details of their mental health management since our last session, including challenges and successes. Counselor engaged group in discussion, covering the following topics: legal processes, community safety, managing MH symptoms with wellness practices, dietary considerations, and boundary setting. Client presents with moderate depression and moderate anxiety. Client denied any current SI/HI/psychosis.  Second Therapeutic Activity: Counselor introduced group to a resource that provides scripts for guided imagery. Counselor chose a healing relaxation script to read aloud to the group. The relaxation practice incorporated deep breathing, progressive muscle relaxation, and imagery. Counselor allowed group members to share about their experience with the practice. Client stated that she felt deeply relaxed and enjoyed the opportunity to connect with her body in that way. Client would like to add more practices to her self-care routine.    Check Out: Counselor prompted group members to share what self-care practice or productivity activity they will engage in over the weekend. Group members shared their plans with the group. Client endorsed safety plan to be followed to prevent safety issues.   Assessment and Plan: Clinician recommends that Client remain in IOP treatment to better manage mental health symptoms, stabilization and to address treatment plan goals. Clinician recommends adherence to crisis/safety plan, taking medications as prescribed, and following up with medical professionals if any issues arise.    Follow Up Instructions: Clinician will send Webex link for next session. The Client was advised to call back or seek an in-person evaluation if the symptoms worsen or if the condition fails to improve as anticipated.     I provided 180 minutes of non-face-to-face time during this encounter.     Lise Auer, LCSW

## 2021-02-17 NOTE — Progress Notes (Signed)
Virtual Visit via Video Note  I connected with Meghan [name] on @TODAY @ at  9:00 AM EDT by a video enabled telemedicine application and verified that I am speaking with the correct person using two identifiers.  Location: Patient: at home Provider at home office   I discussed the limitations of evaluation and management by telemedicine and the availability of in person appointments. The patient expressed understanding and agreed to proceed.  I discussed the assessment and treatment plan with the patient. The patient was provided an opportunity to ask questions and all were answered. The patient agreed with the plan and demonstrated an understanding of the instructions.   The patient was advised to call back or seek an in-person evaluation if the symptoms worsen or if the condition fails to improve as anticipated.  I provided 20 minutes of non-face-to-face time during this encounter.   Meghan [name], M.Ed,CNA   Patient ID: Meghan [name], female   DOB: November 17, 1965, 55 y.o.   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 Meghan [name] reported that she had begun feeling increasingly depressed and anxious during this time, and sought therapy for support. Meghan [name] reported that weekly therapy has proved to be beneficial, as it gives her an opportunity to open up about her struggles without judgment, work on maintaining boundaries with family, improve communication skills, and increase available support. Meghan [name] reported that she would like to continue with therapy to help manage symptoms of depression, anxiety and trauma, stating "I still have my days where I'm depressed and get anxious about a bunch of different stuff". [name] therapist referred pt to virtual Meghan [name] again  d/t worsening depressive and anxiety sx's. Was previously in virtual Meghan [name] in Feb. 2022 d/t similar sx's.  Denies SI/HI or A/V hallucinations. Stressors include: 1) Medical Issues: Pt has had prior procedures (ie. 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 2) Continued conflictual relationships with daughters. Pt apparently has poor boundaries with her twin daughters. "I practically raised my oldest grandson (age 62). He has ADHD and wants to come down here and my daughter won't let him." Pt became very tearful, stating that she used to talk to him everyday and her daughter won't let her now because she's jealous of their close relationship.  Pt states she was fine when her children were visiting for Mother's Day; but states she is now in a "funk."  "I guess I was just putting on a happy face whenever they were visiting but now I'm very depressed. Pt c/o tearfulness, irritability, isolative, poor concentration, anhedonia, no energy or motivation.  States that Dr. Altamese [name] just increased her medications recently. CC: previous CCA for more information/hx.  Pt completed all 8 MH-IOP days that were authorized. Pt continues to have off and on medical emergencies (ie. Most recently two days ago when she had to call 911).  Pt states the ED states she had pulled a muscle in her neck.  Pt reports feeling much better today.  Pt is currently preparing to go to her cousin's wedding [name].  Pt will leave this coming Wednesday.  According to pt, she has been able to set limits with her family prior to arriving there.  "My daughter  asked would I be picking up my grandson from school and I told her absolutely not.  I am coming for the wedding and to visit my friends and family not to do what they want me to do." Pt states her relationship with her kids is much better and they are able to talk things out.  It appears as though if things are going well  between she and her kids, pt is fine but if conflict erupts, pt has difficulty expressing her emotions; thus affecting her medically to the point 911 must be called.  Pt reports the groups were very helpful and she enjoyed meeting with Meghan [name] (group leader) one on one a few times.  On a scale of 1-10 (10 being the worst), pt rates her depression and anxiety at a 1.  Denies SI/HI or A/V hallucinations.  A:  D/C today.  F/U with Dr. Altamese [name] (pt to call for an appt) and will see Meghan Flood, LCSW on 02-20-21 @ 3pm in person.  Pt could benefit from DBT as per discussed in treatment meeting; otherwise pt will go ahead and discharge today.  R:  Patient receptive.  Meghan [name], M.Ed,CNA

## 2021-02-17 NOTE — Patient Instructions (Signed)
D:  Pt completed MH-IOP today.  A:  Discharge.  Follow up with Dr. Altamese [place] (pt to make appt) and [name], LCSW on 02-20-21 @ 3pm.  Encouraged support groups.  R: Patient receptive.

## 2021-02-17 NOTE — Progress Notes (Signed)
Virtual Visit via Video Note  I connected with Meghan [name] on 02/17/21 at  9:00 AM EDT by a video enabled telemedicine application and verified that I am speaking with the correct person using two identifiers.  Location: Patient: Home Provider: Office   I discussed the limitations of evaluation and management by telemedicine and the availability of in person appointments. The patient expressed understanding and agreed to proceed.   I discussed the assessment and treatment plan with the patient. The patient was provided an opportunity to ask questions and all were answered. The patient agreed with the plan and demonstrated an understanding of the instructions.   The patient was advised to call back or seek an in-person evaluation if the symptoms worsen or if the condition fails to improve as anticipated.  I provided 15 minutes of non-face-to-face time during this encounter.   Derrill Center, NP   [facility] Intensive Outpatient Program Discharge Summary  Meghan [name] [id number]  Admission date: 01/28/2021 Discharge date: 02/17/2021  Reason for admission: Per admission assessment note: Meghan [name] 55 year old African-American female was referred by her therapist Chryl Heck due to worsening depression and anxiety. Patient recently completed Intensive Outpatient programing which she reported was helpful at the time however reports ongoing stressors related to family and social isolation.  Patient was tearful throughout this assessment.  Per previous assessment note": She reports ongoing stressors related to family, medical and financial strain.    Family of Origin Issues: Lurlie continues to be mood dependent on family acceptance.   Progress in Program Toward Treatment Goals: Ongoing, patient recently completed intensive outpatient programming.  She attended and participated with daily group session with active engaged participation.  Denying suicidal or homicidal ideations.   Denies auditory or visual hallucinations.  Reports she has been taking medications as directed. Denied any medication refills at this time.  Progress (rationale): Patient to keep all follow-up outpatient appointments. Meghan [name]   Take all medications as prescribed. Keep all follow-up appointments as scheduled.  Do not consume alcohol or use illegal drugs while on prescription medications. Report any adverse effects from your medications to your primary care provider promptly.  In the event of recurrent symptoms or worsening symptoms, call 911, a crisis hotline, or go to the nearest emergency department for evaluation.   Derrill Center, NP 02/17/2021

## 2021-02-20 ENCOUNTER — Ambulatory Visit (INDEPENDENT_AMBULATORY_CARE_PROVIDER_SITE_OTHER): Payer: Medicare Other | Admitting: Licensed Clinical Social Worker

## 2021-02-20 ENCOUNTER — Other Ambulatory Visit: Payer: Self-pay

## 2021-02-20 DIAGNOSIS — F411 Generalized anxiety disorder: Secondary | ICD-10-CM | POA: Diagnosis not present

## 2021-02-20 DIAGNOSIS — F4310 Post-traumatic stress disorder, unspecified: Secondary | ICD-10-CM

## 2021-02-20 DIAGNOSIS — F331 Major depressive disorder, recurrent, moderate: Secondary | ICD-10-CM | POA: Diagnosis not present

## 2021-02-20 NOTE — Progress Notes (Signed)
THERAPIST PROGRESS NOTE   Session Time: 3:00pm - 4:00pm    Location: Patient: [facility] [place] [facility] Provider: [facility] [place] [facility]    Participation Level: Active    Behavioral Response: Alert, casually dressed, anxious mood/affect   Type of Therapy:  Individual Therapy   Treatment Goals addressed: Anxiety/depression management; medication management; Maintaining healthy boundaries; Church attendance    Interventions: CBT: challenging anxious thoughts, problem solving    Summary: Meghan [name] is a 55 year old divorced African American female that presented for therapy appointment today with diagnoses of Major Depressive Disorder, recurrent, moderate, Generalized Anxiety Disorder and PTSD.      Suicidal/Homicidal: None; without plan or intent    Therapist Response:  Clinician met with Meghan [name] for in person therapy session and assessed for safety, sobriety, and medication compliance.  Meghan [name] presented for today's appointment on time and was alert, oriented x5, with no evidence or self-report of active SI/HI or A/V H.  Meghan [name] reported ongoing compliance with medication and denied any use of alcohol or illicit substances.  Clinician inquired about Meghan [name] emotional ratings today, as well as any significant changes in thoughts, feelings, or behavior since last check-in.  Meghan [name] reported scores of 0/10 for depression, 10/10 for anxiety, 0/10 for anger/irritability, but denied experiencing any panic attacks.  Meghan [name] reported that she enjoyed her time in [place] and felt that the group was supportive, and she was able to acquire more coping skills.  Meghan [name] reported that she has also decided to stay with her current church following recent pastor transition, stating "When he heard I was going to leave, he asked to meet with me and we talked for about 2 hours.  I found out how many people were actually in my corner at the church".  Meghan [name] reported that her anxiety was high today as a result of worrying about a wedding she was  invited to attend on Thursday.  Clinician assisted Meghan [name] in running a cost benefit analysis regarding decision of whether or not to attend this upcoming wedding based upon how it might affect her mood/outlook.  Clinician also utilized a CBT handout with Meghan [name] focused upon challenging anxious thoughts.  This involved identifying what her specific anxieties about this event are, in addition to weighing available evidence to support/dispute the likelihood of irrational fears from actually occurring in order to reinforce a more rational perspective on the situation.  Meghan [name] was receptive to this approach, and engaged actively in analysis, noting that there are far more benefits from attending than costs, including the chance to have a good time and get closer with new family.  She reported that she is specifically worried that extended family members she meets with will not be accepting of her and might give her the cold shoulder, but acknowledged that she could still enjoy herself regardless, and noted sufficient evidence from previous encounters with them which disputed the idea that she wouldn't be accepted.  Interventions were effective, as evidenced by Meghan [name] reporting greater assurance that attending the wedding would go well and she would enjoy this, noting a reduction in overall anxiety down to 2/10 following today's session.  Meghan [name] reported that she also enjoyed meeting in person for the first time and felt this would help her become more open and honest about feelings during therapy.  Clinician will continue to monitor.  Plan: Follow up again in 1 week.    Diagnosis: Major depressive disorder, recurrent, moderate; Generalized Anxiety Disorder; and PTSD   [name], LCSW, LCAS 02/20/21

## 2021-02-21 ENCOUNTER — Ambulatory Visit (HOSPITAL_COMMUNITY): Payer: Medicare Other | Admitting: Psychiatry

## 2021-02-21 ENCOUNTER — Encounter (HOSPITAL_COMMUNITY): Payer: Self-pay | Admitting: Psychiatry

## 2021-02-21 DIAGNOSIS — F331 Major depressive disorder, recurrent, moderate: Secondary | ICD-10-CM

## 2021-02-21 NOTE — Progress Notes (Signed)
Client did not attend group due to a doctors appointments that overlapped. Plans to attend next week.   Lise Auer, LCSW

## 2021-03-07 ENCOUNTER — Ambulatory Visit (INDEPENDENT_AMBULATORY_CARE_PROVIDER_SITE_OTHER): Payer: Medicare Other | Admitting: Licensed Clinical Social Worker

## 2021-03-07 ENCOUNTER — Other Ambulatory Visit: Payer: Self-pay

## 2021-03-07 DIAGNOSIS — F411 Generalized anxiety disorder: Secondary | ICD-10-CM

## 2021-03-07 DIAGNOSIS — F331 Major depressive disorder, recurrent, moderate: Secondary | ICD-10-CM | POA: Diagnosis not present

## 2021-03-07 DIAGNOSIS — F4310 Post-traumatic stress disorder, unspecified: Secondary | ICD-10-CM

## 2021-03-07 NOTE — Progress Notes (Signed)
Virtual Visit via Video Note   I connected with Meghan [name] on 03/07/21 at 1:00pm by video enabled telemedicine application and verified that I am speaking with the correct person using two identifiers.   I discussed the limitations, risks, security and privacy concerns of performing an evaluation and management service by video and the availability of in person appointments. I also discussed with the patient that there may be a patient responsible charge related to this service. The patient expressed understanding and agreed to proceed.   I discussed the assessment and treatment plan with the patient. The patient was provided an opportunity to ask questions and all were answered. The patient agreed with the plan and demonstrated an understanding of the instructions.   The patient was advised to call back or seek an in-person evaluation if the symptoms worsen or if the condition fails to improve as anticipated.   I provided 40 minutes of non-face-to-face time during this encounter.     [name], LCSW, LCAS _______________________ THERAPIST PROGRESS NOTE   Session Time: 1:00pm - 1:40pm     Location: Patient: Patient home    Provider: [facility]    Participation Level: Active    Behavioral Response: Alert, casually dressed, euthymic mood/affect     Type of Therapy:  Individual Therapy   Treatment Goals addressed: Anxiety/depression management; medication management; Maintaining healthy boundaries    Interventions: CBT, treatment planning      Summary: Meghan [name] is a 55 year old divorced African American female that presented for therapy appointment today with diagnoses of Major Depressive Disorder, recurrent, moderate, Generalized Anxiety Disorder and PTSD.      Suicidal/Homicidal: None; without plan or intent    Therapist Response:  Clinician met with Meghan [name] for virtual therapy appointment and assessed for safety, sobriety, and medication compliance.  Meghan [name] presented for  today's session on time and was alert, oriented x5, with no evidence or self-report of active SI/HI or A/V H.  Meghan [name] reported that she continues taking medication responsibly and denied any use of alcohol or illicit substances.  Clinician inquired about Meghan [name]'s current emotional ratings, as well as any significant changes in thoughts, feelings, or behavior since previous check-in.  Meghan [name] reported scores of 0/10 for depression, 0/10 for anxiety, 0/10 for anger/irritability, but denied experiencing any panic attacks.  Meghan [name] reported that recently travelled out of state to [place] to attend a wedding and stated "It was beautiful and I actually had a ball".  She reported that since then, she has been spending time with her daughters, grandchildren, and some friends, which has been positive for her mood since everyone has been getting along well.  She denied any present issues that needed to be addressed in session.  Clinician recommended updating treatment plan today due to length of time that has passed since revisions were made.  Meghan [name] was agreeable to this, so clinician made following changes in collaboration with her after verbal consent was provided:  Meet with clinician biweekly for therapy sessions to update on goal progress and address any needs that arise; Follow up with psychiatrist once every 3 months regarding efficacy of medication and any dose modification necessary; Take medications daily as prescribed to aid in symptom reduction and improvement of daily functioning;  Maintain depression at average daily severity of 0/10 for the next 90 days by attending biweekly therapy sessions, and engaging in healthy self-care activities daily to keep mind engaged such as reading, going for walks in the park, and speaking with  family x3 weekly for support; Maintain average daily anxiety level at severity of 0/10 over next 90 days by practicing relaxation techniques with proven efficacy 2-3x daily, in addition to  challenging anxious thoughts that arise to negate negative impact on outlook; Exercise for at least 30 minutes of cardio daily, in addition to following heart-healthy diet to improve both physical and mental well-being per PCP recommendations; Attend appointments with cardiologist PRN and PCP MD x1 per month to address current and any future physical health needs that may arise during treatment; Maintain healthy sleep routine throughout week to ensure average of 8-9 hours rest nightly to reduce irritability, increase focus, and maintain daily motivation; Attend church meetings with clergy members once per week to stay productive, engaged with supportive community, and maintain spiritual support, in addition to any other available activities such as assisting planning committee once per week/once per month; Maintain healthier boundaries with all supports to avoid worsening anxiety and depression, as well as enforce assertive communication skills, with goal of limiting contact with toxic supports until more respectful communication patterns are established; Commit to at least 1 hour of daily writing to stay on track with completion of book by end of December 2022 and achieve creative emotional outlet/release from difficult events in past; Voluntarily seek admission to hospital with assistance from family members or medical professionals if Meghan [name] begins to experience SI/HI or A/V H and safety is determined to be in danger.  Progress is evidenced by Meghan [name] reporting success in maintaining lower levels of depression, anxiety and irritability, regularly attending therapy appointments, keeping up with all medical appointments to ensure good health, maintaining boundaries with triggering family members, connecting with a psychiatrist, and reporting compliance with medications.  She reported that due to progress, she believes she can reduce frequency of therapy sessions to biweekly, stating "I'm actually more confident about  decisions I've made. If you had asked me last month if I thought I could meet every other week, I would have said I'm not ready, but I'm happier and things are going better all around".  Clinician will continue to monitor.                                         Plan: Follow up again biweekly.    Diagnosis: Major depressive disorder, recurrent, moderate; Generalized Anxiety Disorder; and PTSD   [name], LCSW, LCAS 03/07/21

## 2021-03-13 ENCOUNTER — Ambulatory Visit (HOSPITAL_COMMUNITY): Payer: Medicare Other | Admitting: Licensed Clinical Social Worker

## 2021-03-21 ENCOUNTER — Ambulatory Visit (HOSPITAL_COMMUNITY): Payer: Medicare Other | Admitting: Licensed Clinical Social Worker

## 2021-03-23 ENCOUNTER — Ambulatory Visit (HOSPITAL_COMMUNITY): Payer: Medicare Other | Admitting: Licensed Clinical Social Worker

## 2021-04-03 ENCOUNTER — Other Ambulatory Visit: Payer: Self-pay

## 2021-04-03 ENCOUNTER — Other Ambulatory Visit: Payer: Self-pay | Admitting: Orthopedic Surgery

## 2021-04-03 ENCOUNTER — Ambulatory Visit (INDEPENDENT_AMBULATORY_CARE_PROVIDER_SITE_OTHER): Payer: Medicare Other | Admitting: Licensed Clinical Social Worker

## 2021-04-03 DIAGNOSIS — F411 Generalized anxiety disorder: Secondary | ICD-10-CM

## 2021-04-03 DIAGNOSIS — M25512 Pain in left shoulder: Secondary | ICD-10-CM

## 2021-04-03 DIAGNOSIS — F4310 Post-traumatic stress disorder, unspecified: Secondary | ICD-10-CM

## 2021-04-03 DIAGNOSIS — F331 Major depressive disorder, recurrent, moderate: Secondary | ICD-10-CM | POA: Diagnosis not present

## 2021-04-03 NOTE — Progress Notes (Signed)
Virtual Visit via Video Note   I connected with Meghan [name] on 04/03/21 at 2:00pm by video enabled telemedicine application and verified that I am speaking with the correct person using two identifiers.   I discussed the limitations, risks, security and privacy concerns of performing an evaluation and management service by video and the availability of in person appointments. I also discussed with the patient that there may be a patient responsible charge related to this service. The patient expressed understanding and agreed to proceed.   I discussed the assessment and treatment plan with the patient. The patient was provided an opportunity to ask questions and all were answered. The patient agreed with the plan and demonstrated an understanding of the instructions.   The patient was advised to call back or seek an in-person evaluation if the symptoms worsen or if the condition fails to improve as anticipated.   I provided 45 minutes of non-face-to-face time during this encounter.     [name], LCSW, LCAS _______________________ THERAPIST PROGRESS NOTE   Session Time: 2:00pm - 2:45pm  Location: Patient: Patient home    Provider: [facility]    Participation Level: Active    Behavioral Response: Alert, casually dressed, irritable mood/affect   Type of Therapy:  Individual Therapy   Treatment Goals addressed: Anxiety/depression management; medication management   Interventions: CBT, guided imagery     Summary: Meghan [name] is a 55 year old divorced African American female that presented for therapy appointment today with diagnoses of Major Depressive Disorder, recurrent, moderate, Generalized Anxiety Disorder and PTSD.      Suicidal/Homicidal: None; without plan or intent     Therapist Response:  Clinician met with Meghan [name] for virtual therapy session and assessed for safety, sobriety, and medication compliance.  Meghan [name] presented for today's appointment on time and was alert, oriented  x5, with no evidence or self-report of active SI/HI or A/V H.  Marguetta reported ongoing compliance with medication and denied any use of alcohol or illicit substances.  Clinician inquired about Meghan [name]'s emotional ratings today, as well as any significant changes in thoughts, feelings, or behavior since last check-in.  Meghan [name] reported scores of 0/10 for depression, 0/10 for anxiety, 10/10 for irritability, but denied experiencing any panic attacks or outbursts.  Meghan [name] reported that she had her grandkids down for the summer and she has enjoyed time with them although dealing with them has led to a rise in headaches and irritability. Clinician suggested engaging in relaxation activity today with Meghan [name] in order to reduce irritability rating severity and continue to increase available coping skills, and she was agreeable to this, requesting to do a guided imagery exercise involving a beach setting.  Clinician guided Dyan through process of getting comfortable, achieving relaxed breathing pattern, and then narrated visualization of her walking across a beach setting, including various pleasant sensory details to enhance experience (I.e. sand under foot, smell of salt in air, sound of crashing waves and seagulls overhead, etc).  Intervention was effective, as evidenced by Meghan [name] successfully participating in exercise, and reporting that she felt more mentally and physically relaxed afterward, leading to reduction in irritability down to a 5/10.  She reported that she was able to visualize sitting on a sand dune and watching the crashing waves as the sun set.  Clinician encouraged her to incorporate this practice into self-care routine to cope with stressors, and will continue to monitor.  Plan: Follow up again biweekly.    Diagnosis: Major depressive disorder, recurrent, moderate; Generalized Anxiety Disorder; and PTSD   [name], LCSW, LCAS 04/03/21

## 2021-04-04 ENCOUNTER — Other Ambulatory Visit: Payer: Self-pay

## 2021-04-04 ENCOUNTER — Ambulatory Visit (INDEPENDENT_AMBULATORY_CARE_PROVIDER_SITE_OTHER): Payer: Medicare Other | Admitting: Psychiatry

## 2021-04-04 DIAGNOSIS — F411 Generalized anxiety disorder: Secondary | ICD-10-CM | POA: Diagnosis not present

## 2021-04-04 DIAGNOSIS — F331 Major depressive disorder, recurrent, moderate: Secondary | ICD-10-CM | POA: Diagnosis not present

## 2021-04-05 ENCOUNTER — Encounter (HOSPITAL_COMMUNITY): Payer: Self-pay | Admitting: Psychiatry

## 2021-04-05 NOTE — Progress Notes (Signed)
Virtual Visit via Video Note  I connected with Meghan [name] on /19/22 at 12:00 PM EDT by a video enabled telemedicine application and verified that I am speaking with the correct person using two identifiers.  I discussed the limitations of evaluation and management by telemedicine and the availability of in person appointments. The patient expressed understanding and agreed to proceed.    Location: Patient: Patient Home Provider: Home Office   GROUP GOAL: Client will attend IOP Aftercare Group Therapy 2-4x a month to connect with peers and to apply strategies discussed to improve mental health condition.  History of Present Illness: MDD and GAD  Observations/Objective: Counselor met with Patient in the context of Group Therapy, via Webex. Counselor prompted Patient to share updates on coping skill application and individual therapeutic process in management of mental health. Client reports intentional efforts to maintain mental health.  Counselor prompted Patient to share areas of concern, challenges and identify barriers to meeting current goals. Group topics covered: group restructuring, grief and loss, helpful mental health apps, preventing panic attacks. Self-care, utilizing community support groups and other healthy outlets.  Patient engaged in discussion, provided feedback for others within the group and took note of additional strategies reviewed and discussed.   Assessment and Plan: Counselor recommends client continue following treatment plan goals, following crisis plan and following up with behavioral health and medical providers as needed. Patient is welcome to join group at next session.   Follow Up Instructions: Counselor to provide link for next session via Webex platform. The patient was advised to call back or seek an in-person evaluation if the symptoms worsen or if the condition fails to improve as anticipated.  I provided 70 minutes of non-face-to-face time during this  encounter.   Lise Auer, LCSW

## 2021-04-14 ENCOUNTER — Other Ambulatory Visit: Payer: Self-pay

## 2021-04-14 ENCOUNTER — Ambulatory Visit
Admission: RE | Admit: 2021-04-14 | Discharge: 2021-04-14 | Disposition: A | Discharge status: Home / Self Care | Payer: Medicare Other | Source: Ambulatory Visit | Attending: Orthopedic Surgery | Admitting: Orthopedic Surgery

## 2021-04-14 DIAGNOSIS — M25512 Pain in left shoulder: Secondary | ICD-10-CM

## 2021-04-17 ENCOUNTER — Ambulatory Visit (INDEPENDENT_AMBULATORY_CARE_PROVIDER_SITE_OTHER): Payer: Medicare Other | Admitting: Licensed Clinical Social Worker

## 2021-04-17 ENCOUNTER — Other Ambulatory Visit: Payer: Self-pay

## 2021-04-17 DIAGNOSIS — F411 Generalized anxiety disorder: Secondary | ICD-10-CM | POA: Diagnosis not present

## 2021-04-17 DIAGNOSIS — F331 Major depressive disorder, recurrent, moderate: Secondary | ICD-10-CM | POA: Diagnosis not present

## 2021-04-17 DIAGNOSIS — F4310 Post-traumatic stress disorder, unspecified: Secondary | ICD-10-CM

## 2021-04-17 NOTE — Progress Notes (Signed)
Virtual Visit via Video Note   I connected with Meghan [name] on 04/17/21 at 2:00pm by video enabled telemedicine application and verified that I am speaking with the correct person using two identifiers.   I discussed the limitations, risks, security and privacy concerns of performing an evaluation and management service by video and the availability of in person appointments. I also discussed with the patient that there may be a patient responsible charge related to this service. The patient expressed understanding and agreed to proceed.   I discussed the assessment and treatment plan with the patient. The patient was provided an opportunity to ask questions and all were answered. The patient agreed with the plan and demonstrated an understanding of the instructions.   The patient was advised to call back or seek an in-person evaluation if the symptoms worsen or if the condition fails to improve as anticipated.   I provided 35 minutes of non-face-to-face time during this encounter.     [name], LCSW, LCAS _______________________ THERAPIST PROGRESS NOTE   Session Time: 2:00pm - 2:35pm  Location: Patient: Patient home    Provider: [facility]    Participation Level: Active    Behavioral Response: Alert, casually dressed, anxious mood/affect   Type of Therapy:  Individual Therapy   Treatment Goals addressed: Anxiety/depression management; medication management; Completing book by end of 2022    Interventions: CBT: challenging anxious thoughts, problem solving      Summary: Meghan [name] is a 55 year old divorced African American female that presented for therapy appointment today with diagnoses of Major Depressive Disorder, recurrent, moderate, Generalized Anxiety Disorder and PTSD.      Suicidal/Homicidal: None; without plan or intent                                                                                                                       Therapist Response:  Clinician  met with Meghan [name] for virtual therapy appointment and assessed for safety, sobriety, and medication compliance.  Meghan [name] presented for today's session on time and was alert, oriented x5, with no evidence or self-report of active SI/HI or A/V H.  Meghan [name] reported that she continues taking medication as prescribed and denied any use of alcohol or illicit substances.  Clinician inquired about Meghan [name]'s current emotional ratings, as well as any significant changes in thoughts, feelings, or behavior since previous check-in.  Meghan [name] reported scores of 0/10 for depression, 10/10 for anxiety, 10/10 for irritability, but denied experiencing any panic attacks or outbursts.  Meghan [name] reported that she is feeling anxious and irritable today because she has been worried about her doctor's appointment on Wednesday when she will learn whether or not she needs surgery done on her shoulder to address the torn rotator cuff.  Clinician utilized handout in session today titled "Worry exploration" in order to assist Meghan [name] in reducing her anxiety related to upcoming appointment.  This worksheet featured a series of Socratic questions aimed at exploring the most likely outcomes for a situation  of concern, rather than focusing on the worst possible outcome (i.e. catastrophizing).  Clinician assisted Meghan [name] in identifying and challenging any irrational beliefs related to this worry, in addition to utilizing problem solving approach to explore strategies which would help her accomplish goal of completing book during healing phase despite potential limitations following surgery.  Meghan [name] actively participated in discussion on handout, reporting that she is not only worried that her doctor will inform her that she needs surgery, but also worrying that the healing process will be painful, and prevent her from completing book before the end of the year as she originally hoped for.  Meghan [name] reported that there is sufficient evidence to suggest she will need  surgery, but acknowledged that she had this done on her other arm in the past, and coped well.  She reported that she also handles pain well, and remains open to alternatives for dealing with the pain such as physical therapy, rest, and applying heat/cooling as needed.  Intervention was effective, as evidenced by Meghan [name] reporting that discussion on this subject reduced her anxiety and irritability down to 4/10, and increased her confidence in her ability to complete book despite surgery.  Meghan [name] stated "I was able to talk about it and talk through it.  I realize that sometimes I do tend to get anxious about things and should know that there are alternative ways of looking at it".  Clinician continue to monitor.                                             Plan: Follow up again biweekly.    Diagnosis: Major depressive disorder, recurrent, moderate; Generalized Anxiety Disorder; and PTSD   [name], LCSW, LCAS 04/17/21

## 2021-04-25 ENCOUNTER — Other Ambulatory Visit: Payer: Self-pay

## 2021-04-25 ENCOUNTER — Ambulatory Visit (HOSPITAL_COMMUNITY): Payer: Medicare Other | Admitting: Psychiatry

## 2021-04-25 DIAGNOSIS — F331 Major depressive disorder, recurrent, moderate: Secondary | ICD-10-CM

## 2021-04-26 ENCOUNTER — Encounter (HOSPITAL_COMMUNITY): Payer: Self-pay | Admitting: Psychiatry

## 2021-04-26 ENCOUNTER — Telehealth: Payer: Self-pay | Admitting: *Deleted

## 2021-04-26 NOTE — Progress Notes (Signed)
Group member contacted stating issues with being able to join group at set time. No current needs noted.   Romelle Starcher [name] Revak LCSW

## 2021-04-26 NOTE — Telephone Encounter (Signed)
Received surgical clearance form from [facility] [phone]). Pending surgery for left shoulder arthroscopy rotator cuff repair, subacromial decompression.   Dr. Krista Blue completed form. Advised patient is low risk. Safely stop levetiracetam '500mg'$ , one tab BID, twelve hours prior to surgery and resume when PO intake is okay.   Faxed and confirmed by to [phone], attention Marcelo Baldy, surgery coordinator (direct phone [phone]).

## 2021-04-30 IMAGING — CT CT HEAD W/O CM
3 series · 14 of 47 positions shown, 16 images · non-contrast
Comparison: April 04, 2020.

CLINICAL DATA: Seizure today.

EXAM:
CT HEAD WITHOUT CONTRAST
TECHNIQUE: Contiguous axial images were obtained from the base of the skull
through the vertex without intravenous contrast.

[Series 2: head wo · axial · 0.47mm/px · z∈[-160,-15]mm · 8 of 35 slices shown, 10 images]
[im 3/35  brain]
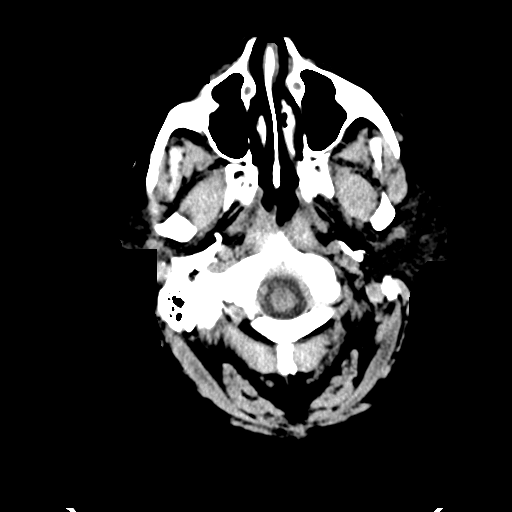
[im 3/35  bone]
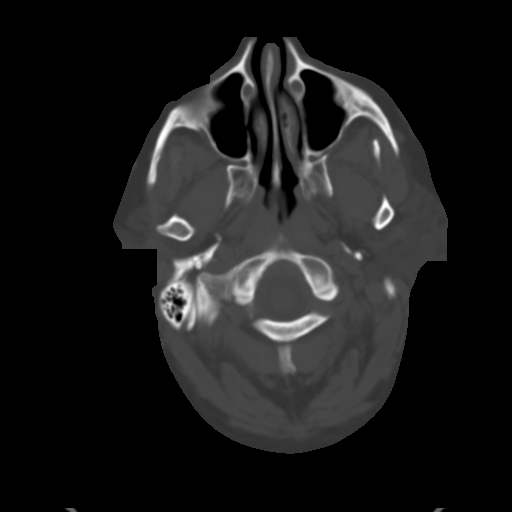
[im 8/35  brain]
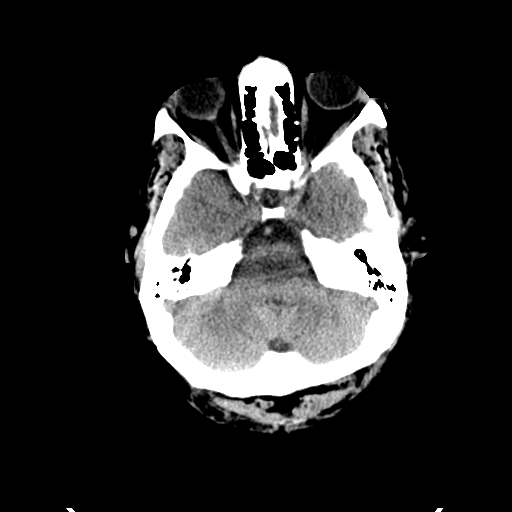
[im 11/35  brain]
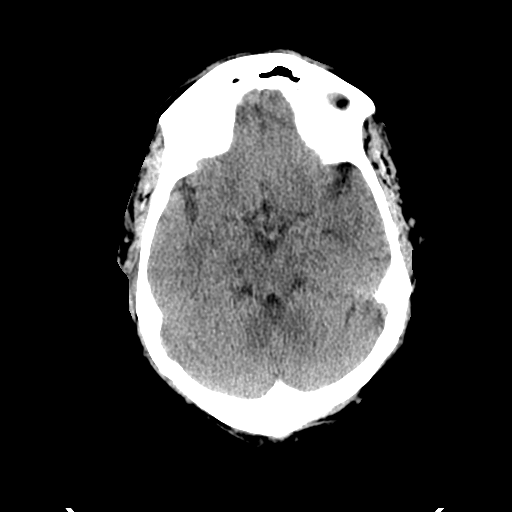
[im 16/35  brain]
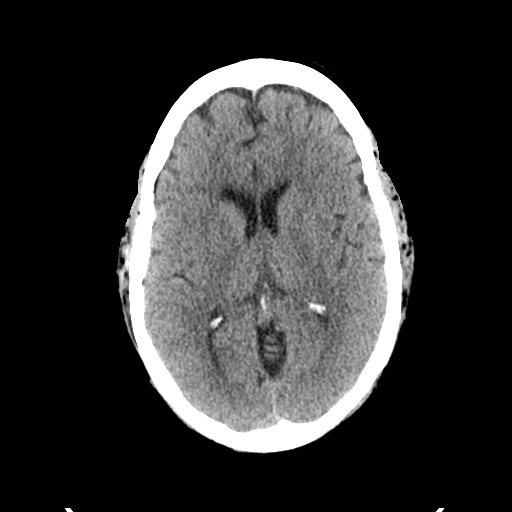
[im 19/35  brain]
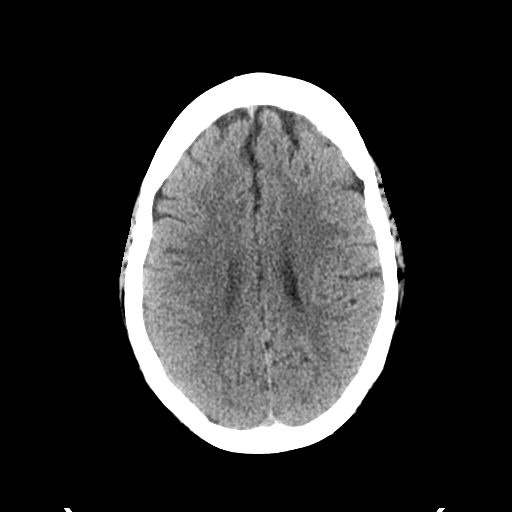
[im 19/35  bone]
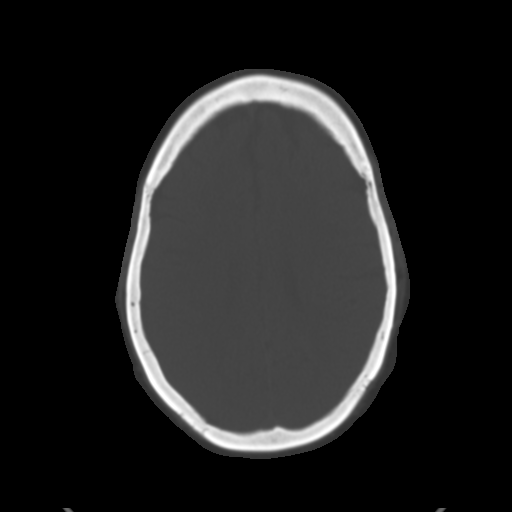
[im 24/35  brain]
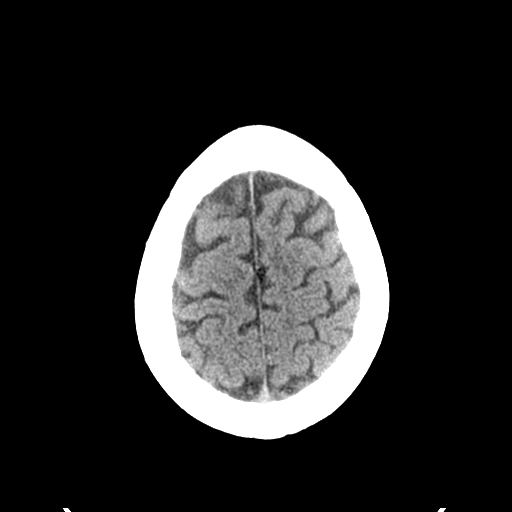
[im 27/35  brain]
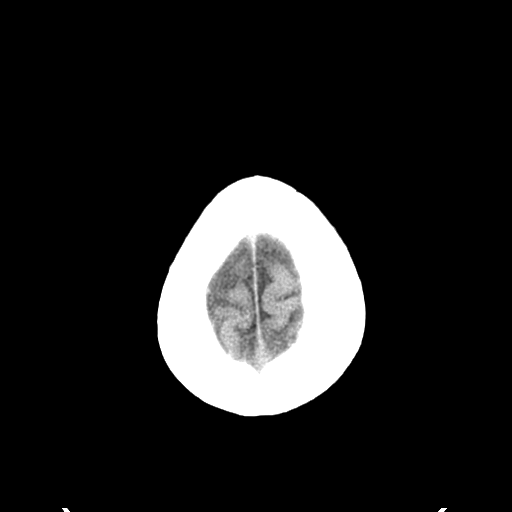
[im 32/35  brain]
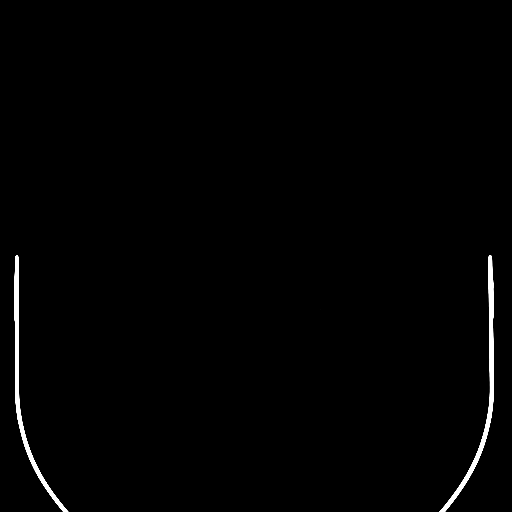

[Series 5: coronal soft tissue · coronal · 0.30mm/px · 3 of 70 slices shown]
[im 24/70  brain]
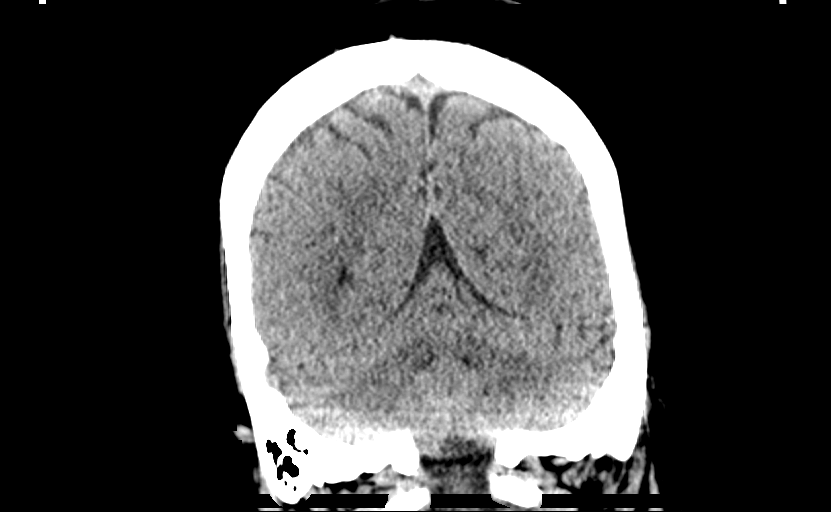
[im 31/70  brain]
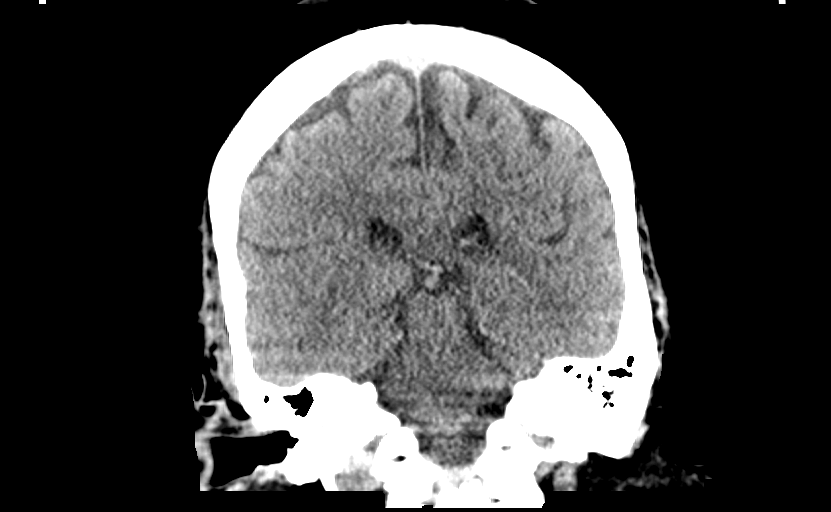
[im 39/70  brain]
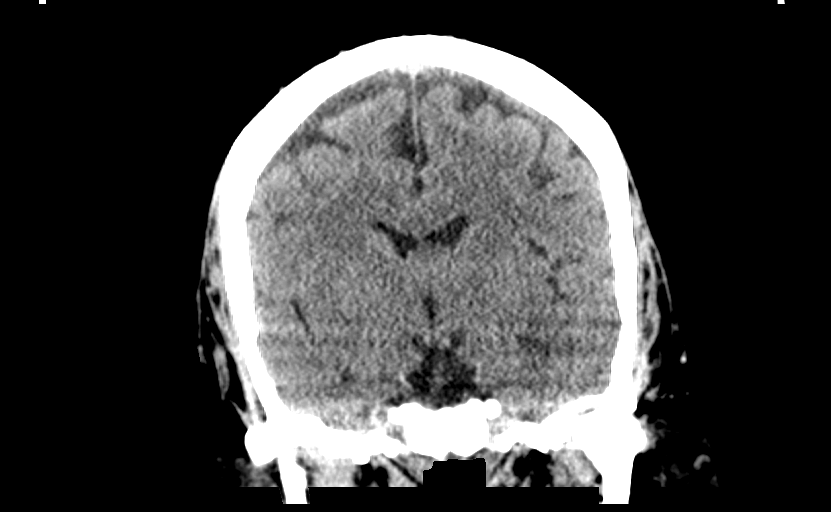

[Series 6: sagittal soft tissue · sagittal · 0.33mm/px · 3 of 59 slices shown]
[im 20/59  brain]
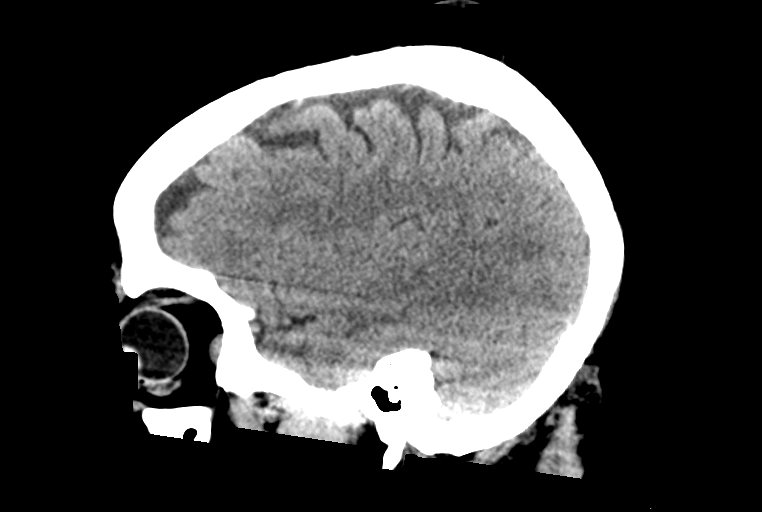
[im 30/59  brain]
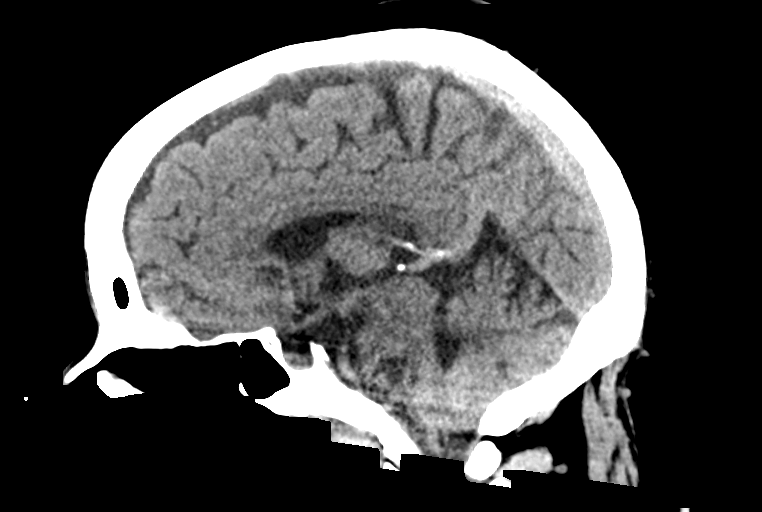
[im 39/59  brain]
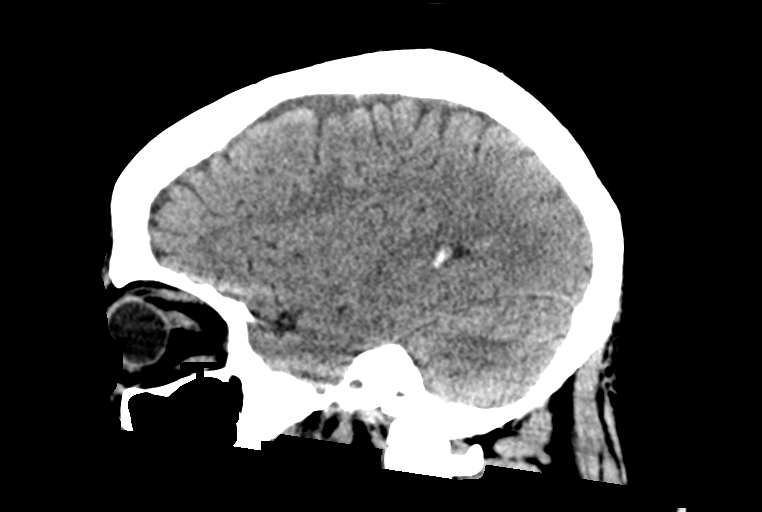

[14 of 47 positions shown; findings below may reference images not displayed]

FINDINGS: Brain: No evidence of acute infarction, hemorrhage, hydrocephalus,
extra-axial collection or mass lesion/mass effect.

Vascular: No hyperdense vessel or unexpected calcification.

Skull: Normal. Negative for fracture or focal lesion.

Sinuses/Orbits: No acute finding.

Other: None.
IMPRESSION: No acute intracranial abnormalities. No cause for the patient's
symptoms identified.

## 2021-05-01 ENCOUNTER — Ambulatory Visit (INDEPENDENT_AMBULATORY_CARE_PROVIDER_SITE_OTHER): Payer: Medicare Other | Admitting: Licensed Clinical Social Worker

## 2021-05-01 ENCOUNTER — Other Ambulatory Visit: Payer: Self-pay

## 2021-05-01 DIAGNOSIS — F411 Generalized anxiety disorder: Secondary | ICD-10-CM

## 2021-05-01 DIAGNOSIS — F331 Major depressive disorder, recurrent, moderate: Secondary | ICD-10-CM | POA: Diagnosis not present

## 2021-05-01 DIAGNOSIS — F4310 Post-traumatic stress disorder, unspecified: Secondary | ICD-10-CM

## 2021-05-01 NOTE — Progress Notes (Signed)
Virtual Visit via Video Note   I connected with Meghan [name] on 05/01/21 at 2:00pm by video enabled telemedicine application and verified that I am speaking with the correct person using two identifiers.   I discussed the limitations, risks, security and privacy concerns of performing an evaluation and management service by video and the availability of in person appointments. I also discussed with the patient that there may be a patient responsible charge related to this service. The patient expressed understanding and agreed to proceed.   I discussed the assessment and treatment plan with the patient. The patient was provided an opportunity to ask questions and all were answered. The patient agreed with the plan and demonstrated an understanding of the instructions.   The patient was advised to call back or seek an in-person evaluation if the symptoms worsen or if the condition fails to improve as anticipated.   I provided 50 minutes of non-face-to-face time during this encounter.     [name], LCSW, LCAS _______________________ THERAPIST PROGRESS NOTE   Session Time: 2:00pm - 2:50pm  Location: Patient: Patient home    Provider: [facility]    Participation Level: Active    Behavioral Response: Alert, casually dressed, irritable mood/affect   Type of Therapy:  Individual Therapy   Treatment Goals addressed: Anxiety/depression management; medication management   Interventions: CBT, mindfulness meditation for pain relief    Summary: Meghan [name] is a 55 year old divorced African American female that presented for therapy appointment today with diagnoses of Major Depressive Disorder, recurrent, moderate, Generalized Anxiety Disorder and PTSD.      Suicidal/Homicidal: None; without plan or intent                                                                                                                       Therapist Response:  Clinician met with Meghan [name] for virtual therapy  session and assessed for safety, sobriety, and medication compliance.  Meghan [name] presented for today's appointment on time and was alert, oriented x5, with no evidence or self-report of active SI/HI or A/V H.  Meghan [name] reported ongoing compliance with medication and denied any use of alcohol or illicit substances.  Clinician inquired about Meghan [name]'s emotional ratings today, as well as any significant changes in thoughts, feelings, or behavior since last check-in.  Meghan [name] reported scores of 0/10 for depression, 0/10 for anxiety, 10/10 for irritability, but denied experiencing any panic attacks or outbursts.  Meghan [name] reported that she is feeling increased irritability today due to issues acquiring her learners permit over the past day, anticipating her grandchildren moving back home, and experiencing ongoing pain of 10/10 in severity from her rotator cuff injury.  Clinician offered to teach Meghan [name] a mindfulness meditation exercise today focused on pain relief, and she was agreeable to this.  Clinician guided Meghan [name] through process of getting comfortable, achieving relaxing breathing rhythm, and then practicing combination activity involving comprehensive body scan, and reciting pain relief affirmations. Intervention was effective, as evidenced by Karlye successfully  engaging in this activity and reporting that her pain level reduced to 3/10 in severity, and she felt more relaxed and less irritable afterward.  She stated "When I concentrate on other things it doesn't seem to bother me at all.  This was a good distraction".  Meghan [name] reported that she would utilize this to help manage pain in days ahead as part of her self-care routine.  Clinician will continue to monitor.                                             Plan: Follow up again biweekly.    Diagnosis: Major depressive disorder, recurrent, moderate; Generalized Anxiety Disorder; and PTSD   Cory  Bates, LCSW, LCAS 05/01/21  

## 2021-05-15 ENCOUNTER — Telehealth (HOSPITAL_COMMUNITY): Payer: Self-pay | Admitting: Licensed Clinical Social Worker

## 2021-05-15 ENCOUNTER — Ambulatory Visit (HOSPITAL_COMMUNITY): Payer: Medicare Other | Admitting: Licensed Clinical Social Worker

## 2021-05-15 ENCOUNTER — Other Ambulatory Visit: Payer: Self-pay

## 2021-05-15 NOTE — Telephone Encounter (Signed)
Meghan [name] had a virtual therapy appointment scheduled today at 2pm.  Clinician outreached her at 2:05pm when she had not presented to virtual meeting as scheduled despite email and text reminders that were sent.  Meghan [name] did not answer outreach call, so a voicemail was left reminding her of this appointment, and callback number was included for rescheduling if needed.  Clinician ended virtual meeting at 2:15pm when Meghan [name] had not returned call or appeared in virtual room, and informed front desk staff of no show event.    [name], LCSW, LCAS 05/15/21

## 2021-05-16 ENCOUNTER — Ambulatory Visit (INDEPENDENT_AMBULATORY_CARE_PROVIDER_SITE_OTHER): Payer: Medicare Other | Admitting: Licensed Clinical Social Worker

## 2021-05-16 ENCOUNTER — Other Ambulatory Visit: Payer: Self-pay

## 2021-05-16 DIAGNOSIS — F331 Major depressive disorder, recurrent, moderate: Secondary | ICD-10-CM

## 2021-05-16 DIAGNOSIS — F411 Generalized anxiety disorder: Secondary | ICD-10-CM

## 2021-05-16 DIAGNOSIS — F4310 Post-traumatic stress disorder, unspecified: Secondary | ICD-10-CM

## 2021-05-16 NOTE — Progress Notes (Signed)
Virtual Visit via Telephone Note   I connected with Meghan [name] on 05/16/21 at 8:00am by telephone and verified that I am speaking with the correct person using two identifiers.   I discussed the limitations, risks, security and privacy concerns of performing an evaluation and management service by telephone and the availability of in person appointments. I also discussed with the patient that there may be a patient responsible charge related to this service. The patient expressed understanding and agreed to proceed.   I discussed the assessment and treatment plan with the patient. The patient was provided an opportunity to ask questions and all were answered. The patient agreed with the plan and demonstrated an understanding of the instructions.   The patient was advised to call back or seek an in-person evaluation if the symptoms worsen or if the condition fails to improve as anticipated.   I provided 30 minutes of non-face-to-face time during this encounter.     [name], LCSW, LCAS _______________________ THERAPIST PROGRESS NOTE   Session Time: 8:00am - 8:30am   Location: Patient: Patient home    Provider: [facility] [place] [facility]    Participation Level: Active    Behavioral Response: Alert, euthymic mood   Type of Therapy:  Individual Therapy   Treatment Goals addressed: Anxiety/depression management; medication management; Completing book; Attending medical appointments    Interventions: CBT, ACT skills    Summary: Meghan [name] is a 55 year old divorced African American female that presented for therapy appointment today with diagnoses of Major Depressive Disorder, recurrent, moderate, Generalized Anxiety Disorder and PTSD.      Suicidal/Homicidal: None; without plan or intent                                                                                                                        Therapist Response:  Clinician spoke with Meghan [name] for telephone therapy appointment and assessed  for safety, sobriety, and medication compliance.  Meghan [name] answered phone call on time for today's session on time and spoke in a manner that was alert, oriented x5, with no evidence or self-report of active SI/HI or A/V H.  Meghan [name] reported that she continues taking medication as prescribed and denied any use of alcohol or illicit substances.  Clinician inquired about Meghan [name]'s current emotional ratings, as well as any significant changes in thoughts, feelings, or behavior since previous check-in.  Meghan [name] reported scores of 0/10 for depression, 0/10 for anxiety, 0/10 for irritability, and denied experiencing any panic attacks or outbursts.  Meghan [name] reported that she needed to do an appointment by telephone today as she was dropping her grandson off at the train station and was not around her computer.  She reported that she has been in a positive mood, and is looking forward to having her own space at home again.  Meghan [name] reported that she is attending church, and working on completing her book during free time.  Meghan [name] reported that her only challenge is ruminating upon her upcoming surgery at  times, which she is still anxious about.  Clinician offered to teach Meghan [name] an ACT relaxation technique today to aid in managing difficult thoughts, feelings, urges, and sensations, which she was agreeable to.  Clinician guided Meghan [name] through process of getting comfortable, achieving relaxing breathing rhythm, and then maintaining this throughout activity.  Clinician invited Meghan [name] to imagine a gently flowing stream in her mind with leaves floating upon it, and when any thoughts, feelings, urges, or sensations arose, good or bad, she would visualize placing them on these passing leaves over course of 10 minutes practice.  Intervention was effective, as evidenced by Meghan [name] participating in exercise successfully, and reporting that it made her feel more relaxed, and helped take her mind off the upcoming surgery.  She reported that she would  plan to add this to coping skills.  Clinician will continue to monitor.                                             Plan: Follow up again biweekly.    Diagnosis: Major depressive disorder, recurrent, moderate; Generalized Anxiety Disorder; and PTSD   [name], LCSW, LCAS 05/16/21

## 2021-05-29 ENCOUNTER — Ambulatory Visit (INDEPENDENT_AMBULATORY_CARE_PROVIDER_SITE_OTHER): Payer: Medicare Other | Admitting: Licensed Clinical Social Worker

## 2021-05-29 ENCOUNTER — Other Ambulatory Visit: Payer: Self-pay

## 2021-05-29 DIAGNOSIS — F331 Major depressive disorder, recurrent, moderate: Secondary | ICD-10-CM

## 2021-05-29 DIAGNOSIS — F411 Generalized anxiety disorder: Secondary | ICD-10-CM | POA: Diagnosis not present

## 2021-05-29 DIAGNOSIS — F4310 Post-traumatic stress disorder, unspecified: Secondary | ICD-10-CM

## 2021-05-29 NOTE — Progress Notes (Signed)
Virtual Visit via Video Note   I connected with Meghan [name] on 05/29/21 at 3:00pm by video enabled telemedicine application and verified that I am speaking with the correct person using two identifiers.   I discussed the limitations, risks, security and privacy concerns of performing an evaluation and management service by video and the availability of in person appointments. I also discussed with the patient that there may be a patient responsible charge related to this service. The patient expressed understanding and agreed to proceed.   I discussed the assessment and treatment plan with the patient. The patient was provided an opportunity to ask questions and all were answered. The patient agreed with the plan and demonstrated an understanding of the instructions.   The patient was advised to call back or seek an in-person evaluation if the symptoms worsen or if the condition fails to improve as anticipated.   I provided 1 hour of non-face-to-face time during this encounter.     Cory Bates, LCSW, LCAS _______________________ THERAPIST PROGRESS NOTE   Session Time: 3:00pm - 4:00pm   Location: Patient: Patient home    Provider: [facility]    Participation Level: Active    Behavioral Response: Alert, casually dressed, irritable mood/affect   Type of Therapy:  Individual Therapy   Treatment Goals addressed: Anxiety/depression management; Medication management   Interventions: CBT, 5-4-3-2-1 grounding technique     Summary: Carole Helming is a 55 year old divorced African American female that presented for therapy appointment today with diagnoses of Major Depressive Disorder, recurrent, moderate, Generalized Anxiety Disorder and PTSD.      Suicidal/Homicidal: None; without plan or intent                                                                                                                        Therapist Response:  Clinician met with Dior for virtual therapy session and  assessed for safety, sobriety, and medication compliance.  Shaka presented for appointment on time and spoke in a manner that was alert, oriented x5, with no evidence or self-report of active SI/HI or A/V H.  Liya reported ongoing compliance with medication and denied any use of alcohol or illicit substances.  Clinician inquired about Neena's emotional ratings today, as well as any significant changes in thoughts, feelings, or behavior since last check-in.  Glynis reported scores of 0/10 for depression, 0/10 for anxiety, and 10/10 for irritability, but denied experiencing any recent panic attacks or outbursts.  Feliza reported that she is feeling irritable because she is stuck in the house, and dependent on everyone else's help following recent successful surgery.  She reported that she had also tried to engage in a guided imagery exercise on her porch earlier, but neighbors disrupted this activity and further annoyed her.  Clinician offered to teach Collyns the 5-4-3-2-1 grounding technique today in session to help temporarily distract her from troubling thoughts and/or feelings when needed and curb related irritability.  Clinician guided Kresha through process of   scanning her environment and identifying 5 things he could see, 4 things he could touch, 3 things he could hear, 2 he could smell, and 1 thing he could taste.  Intervention was effective, as evidenced by Erinn participating in activity successfully and reporting that she enjoyed it and feels like this technique could be helpful with distraction when needed if practiced regularly.  Clinician will continue to monitor.                                    Plan: Follow up again biweekly.    Diagnosis: Major depressive disorder, recurrent, moderate; Generalized Anxiety Disorder; and PTSD   Cory  Bates, LCSW, LCAS 05/29/21 

## 2021-05-30 ENCOUNTER — Ambulatory Visit (INDEPENDENT_AMBULATORY_CARE_PROVIDER_SITE_OTHER): Payer: Medicare Other | Admitting: Psychiatry

## 2021-05-30 ENCOUNTER — Other Ambulatory Visit: Payer: Self-pay

## 2021-05-30 DIAGNOSIS — F331 Major depressive disorder, recurrent, moderate: Secondary | ICD-10-CM | POA: Diagnosis not present

## 2021-05-30 DIAGNOSIS — F411 Generalized anxiety disorder: Secondary | ICD-10-CM | POA: Diagnosis not present

## 2021-06-01 ENCOUNTER — Encounter (HOSPITAL_COMMUNITY): Payer: Self-pay | Admitting: Psychiatry

## 2021-06-01 NOTE — Progress Notes (Signed)
Virtual Visit via Video Note  I connected with Meghan [name] on 06/01/21 at  2:00 PM EDT by a video enabled telemedicine application and verified that I am speaking with the correct person using two identifiers.  I discussed the limitations of evaluation and management by telemedicine and the availability of in person appointments. The patient expressed understanding and agreed to proceed.    Location: Patient: Patient Home Provider: Home Office   GROUP GOAL: Client will attend IOP Aftercare Group Therapy 2-4x a month to connect with peers and to apply strategies discussed to improve mental health condition.  History of Present Illness: MDD GAD  Observations/Objective: Counselor met with Patient in the context of Group Therapy, via Webex. Counselor prompted Patient to share updates on coping skill application and individual therapeutic process in management of mental health. Client reports intentional efforts to maintain mental health.  Counselor prompted Patient to share areas of concern, challenges and identify barriers to meeting current goals. Group topics covered: exploring new supports and interests, navigating relationships, communication and boundary setting.  Patient engaged in discussion, provided feedback for others within the group and took note of additional strategies reviewed and discussed.   Counselor shared that today would be one of our last group sessions, as Counselor will be ending time with [facility] effective October 1. Client was understanding and we discussed an appropriate transfer of care. Client to remain established with practice for counseling and psychiatry. Client aware of how to follow up and follow safety plan if needed.   Assessment and Plan: Counselor will meet with patient once more to provider group treatment before discharging or transferring care. Patient will continue to follow recommendations of providers and implement skills learned in session.   Follow Up  Instructions: Counselor to send out list of potential providers for Clients to receive group treatment via mail.   The patient was advised to call back or seek an in-person evaluation if the symptoms worsen or if the condition fails to improve as anticipated.  I provided 60 minutes of non-face-to-face time during this encounter.   Lise Auer, LCSW

## 2021-06-12 ENCOUNTER — Other Ambulatory Visit: Payer: Self-pay

## 2021-06-12 ENCOUNTER — Ambulatory Visit (INDEPENDENT_AMBULATORY_CARE_PROVIDER_SITE_OTHER): Payer: Medicare Other | Admitting: Licensed Clinical Social Worker

## 2021-06-12 DIAGNOSIS — F411 Generalized anxiety disorder: Secondary | ICD-10-CM | POA: Diagnosis not present

## 2021-06-12 DIAGNOSIS — F4310 Post-traumatic stress disorder, unspecified: Secondary | ICD-10-CM

## 2021-06-12 DIAGNOSIS — F331 Major depressive disorder, recurrent, moderate: Secondary | ICD-10-CM
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
## 2021-06-26 ENCOUNTER — Ambulatory Visit (INDEPENDENT_AMBULATORY_CARE_PROVIDER_SITE_OTHER): Payer: Medicare Other | Admitting: Licensed Clinical Social Worker

## 2021-06-26 ENCOUNTER — Other Ambulatory Visit: Payer: Self-pay

## 2021-06-26 DIAGNOSIS — F331 Major depressive disorder, recurrent, moderate: Secondary | ICD-10-CM

## 2021-06-26 DIAGNOSIS — F411 Generalized anxiety disorder: Secondary | ICD-10-CM | POA: Diagnosis not present

## 2021-06-26 DIAGNOSIS — F4310 Post-traumatic stress disorder, unspecified: Secondary | ICD-10-CM | POA: Diagnosis not present

## 2021-06-26 NOTE — Progress Notes (Signed)
Virtual Visit via Video Note   I connected with Meghan [name] on 06/26/21 at 2:00pm by video enabled telemedicine application and verified that I am speaking with the correct person using two identifiers.   I discussed the limitations, risks, security and privacy concerns of performing an evaluation and management service by video and the availability of in person appointments. I also discussed with the patient that there may be a patient responsible charge related to this service. The patient expressed understanding and agreed to proceed.   I discussed the assessment and treatment plan with the patient. The patient was provided an opportunity to ask questions and all were answered. The patient agreed with the plan and demonstrated an understanding of the instructions.   The patient was advised to call back or seek an in-person evaluation if the symptoms worsen or if the condition fails to improve as anticipated.   I provided 30 minutes of non-face-to-face time during this encounter.     [name], LCSW, LCAS _______________________ THERAPIST PROGRESS NOTE   Session Time: 2:00pm - 2:30pm      Location: Patient: Patient home    Provider: [facility] [place] [facility]    Participation Level: Active    Behavioral Response: Alert, casually dressed, euthymic mood/affect   Type of Therapy:  Individual Therapy   Treatment Goals addressed: Anxiety/depression management; Medication management; Church attendance    Interventions: CBT, mental grounding techniques    Summary: Meghan [name] is a 55 year old divorced African American female that presented for therapy appointment today with diagnoses of Major Depressive Disorder, recurrent, moderate, Generalized Anxiety Disorder and PTSD.      Suicidal/Homicidal: None; without plan or intent                                                                                                                        Therapist Response:  Clinician met with Meghan [name] for  virtual therapy session and assessed for safety, sobriety, and medication compliance.  Meghan [name] presented for appointment on time and spoke in a manner that was alert, oriented x5, with no evidence or self-report of active SI/HI or A/V H.  Meghan [name] reported ongoing compliance with medication and denied any use of alcohol or illicit substances.  Clinician inquired about Meghan [name]'s emotional ratings today, as well as any significant changes in thoughts, feelings, or behavior since last check-in.  Meghan [name] reported scores of 0/10 for depression, 0/10 for anxiety, and 0/10 for irritability.  Meghan [name] also denied experiencing any recent panic attacks or outbursts.  Meghan [name] reported that she has been doing well, most recently attending the local fair event with her sister, eating out with a friend for lunch today, and attending church service on Sunday.  Meghan [name] denied having any significant issues to address today, so clinician suggested discussion on grounding techniques she could use to enhance coping ability.  Meghan [name] was agreeable to this, so clinician reminded her how grounding techniques can be utilized to temporarily distract from distressing thoughts, or  feelings, and covered category of mental grounding techniques with her today, including examples for practice such as describing one's environment in detail, playing a categories game, describing an everyday activity in great detail, using one's imagination to visualize a calming image, counting to 10, and more.  Intervention was effective, as evidenced by Meghan [name] engaging in discussion on the subject and actively trying some of these techniques out in session, expressing interest in examples such as listing titles of children's books, visualizing a relaxing day on the beach, or counting to 10 while taking deep breaths to aid in calming down.  Clinician will continue to monitor.                                    Plan: Follow up again biweekly.    Diagnosis: Major depressive  disorder, recurrent, moderate; Generalized Anxiety Disorder; and PTSD   [name], LCSW, LCAS 06/26/21

## 2021-07-01 ENCOUNTER — Encounter: Payer: Self-pay | Admitting: Primary Care

## 2021-07-04 ENCOUNTER — Encounter: Payer: Self-pay | Admitting: Neurology

## 2021-07-04 ENCOUNTER — Telehealth (INDEPENDENT_AMBULATORY_CARE_PROVIDER_SITE_OTHER): Payer: Medicare Other | Admitting: Neurology

## 2021-07-04 DIAGNOSIS — R569 Unspecified convulsions: Secondary | ICD-10-CM | POA: Diagnosis not present

## 2021-07-04 DIAGNOSIS — Z87828 Personal history of other (healed) physical injury and trauma: Secondary | ICD-10-CM | POA: Diagnosis not present

## 2021-07-04 MED ORDER — LEVETIRACETAM 500 MG PO TABS: 500.0000 mg | ORAL_TABLET | Freq: Two times a day (BID) | ORAL | 3 refills | Status: DC
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
## 2021-07-10 ENCOUNTER — Other Ambulatory Visit: Payer: Self-pay

## 2021-07-10 ENCOUNTER — Ambulatory Visit (INDEPENDENT_AMBULATORY_CARE_PROVIDER_SITE_OTHER): Payer: Medicare Other | Admitting: Licensed Clinical Social Worker

## 2021-07-10 DIAGNOSIS — F331 Major depressive disorder, recurrent, moderate: Secondary | ICD-10-CM | POA: Diagnosis not present

## 2021-07-10 DIAGNOSIS — F411 Generalized anxiety disorder: Secondary | ICD-10-CM

## 2021-07-10 DIAGNOSIS — F4310 Post-traumatic stress disorder, unspecified: Secondary | ICD-10-CM
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
## 2021-07-19 ENCOUNTER — Emergency Department (HOSPITAL_COMMUNITY): Payer: Medicare Other

## 2021-07-19 ENCOUNTER — Emergency Department (HOSPITAL_COMMUNITY)
Admission: EM | Admit: 2021-07-19 | Discharge: 2021-07-19 | Disposition: A | Discharge status: Home / Self Care | Payer: Medicare Other | Attending: Emergency Medicine | Admitting: Emergency Medicine

## 2021-07-19 DIAGNOSIS — E119 Type 2 diabetes mellitus without complications: Secondary | ICD-10-CM | POA: Insufficient documentation

## 2021-07-19 DIAGNOSIS — R569 Unspecified convulsions: Secondary | ICD-10-CM | POA: Diagnosis present

## 2021-07-19 DIAGNOSIS — M25562 Pain in left knee: Secondary | ICD-10-CM | POA: Diagnosis not present

## 2021-07-19 DIAGNOSIS — M25552 Pain in left hip: Secondary | ICD-10-CM | POA: Insufficient documentation

## 2021-07-19 DIAGNOSIS — W1830XA Fall on same level, unspecified, initial encounter: Secondary | ICD-10-CM | POA: Insufficient documentation

## 2021-07-19 DIAGNOSIS — J45909 Unspecified asthma, uncomplicated: Secondary | ICD-10-CM | POA: Insufficient documentation

## 2021-07-19 DIAGNOSIS — M25512 Pain in left shoulder: Secondary | ICD-10-CM | POA: Insufficient documentation

## 2021-07-19 DIAGNOSIS — I509 Heart failure, unspecified: Secondary | ICD-10-CM | POA: Insufficient documentation

## 2021-07-19 DIAGNOSIS — W19XXXA Unspecified fall, initial encounter: Secondary | ICD-10-CM

## 2021-07-19 LAB — COMPREHENSIVE METABOLIC PANEL
ALT: 11 U/L (ref 0–44)
AST: 15 U/L (ref 15–41)
Albumin: 3.4 g/dL — ABNORMAL LOW (ref 3.5–5.0)
Alkaline Phosphatase: 175 U/L — ABNORMAL HIGH (ref 38–126)
Anion gap: 7 (ref 5–15)
BUN: 17 mg/dL (ref 6–20)
CO2: 26 mmol/L (ref 22–32)
Calcium: 10.1 mg/dL (ref 8.9–10.3)
Chloride: 105 mmol/L (ref 98–111)
Creatinine, Ser: 0.78 mg/dL (ref 0.44–1.00)
GFR, Estimated: >60 mL/min (ref >60)
Glucose, Bld: 92 mg/dL (ref 70–99)
Potassium: 4.4 mmol/L (ref 3.5–5.1)
Sodium: 138 mmol/L (ref 135–145)
Total Bilirubin: 0.7 mg/dL (ref 0.3–1.2)
Total Protein: 7.3 g/dL (ref 6.5–8.1)

## 2021-07-19 LAB — CBC
HCT: 36.1 % (ref 36.0–46.0)
Hemoglobin: 11.5 g/dL — ABNORMAL LOW (ref 12.0–15.0)
MCH: 26.4 pg (ref 26.0–34.0)
MCHC: 31.9 g/dL (ref 30.0–36.0)
MCV: 83 fL (ref 80.0–100.0)
Platelets: 267 10*3/uL (ref 150–400)
RBC: 4.35 MIL/uL (ref 3.87–5.11)
RDW: 15 % (ref 11.5–15.5)
WBC: 6.2 10*3/uL (ref 4.0–10.5)
nRBC: 0 % (ref 0.0–0.2)

## 2021-07-19 LAB — I-STAT CHEM 8, ED
BUN: 20 mg/dL (ref 6–20)
Calcium, Ion: 1.33 mmol/L (ref 1.15–1.40)
Chloride: 104 mmol/L (ref 98–111)
Creatinine, Ser: 0.8 mg/dL (ref 0.44–1.00)
Glucose, Bld: 94 mg/dL (ref 70–99)
HCT: 35 % — ABNORMAL LOW (ref 36.0–46.0)
Hemoglobin: 11.9 g/dL — ABNORMAL LOW (ref 12.0–15.0)
Potassium: 4.4 mmol/L (ref 3.5–5.1)
Sodium: 140 mmol/L (ref 135–145)
TCO2: 27 mmol/L (ref 22–32)

## 2021-07-19 MED ORDER — ONDANSETRON HCL 4 MG/2ML IJ SOLN
INTRAMUSCULAR | Status: AC
  Administered 2021-07-19: 4 mg
  Filled 2021-07-19: qty 2

## 2021-07-19 MED ORDER — FENTANYL CITRATE (PF) 100 MCG/2ML IJ SOLN
INTRAMUSCULAR | Status: AC
  Administered 2021-07-19: 50 ug
  Filled 2021-07-19: qty 2

## 2021-07-19 MED ORDER — FENTANYL CITRATE PF 50 MCG/ML IJ SOSY: 50.0000 ug | PREFILLED_SYRINGE | Freq: Once | INTRAMUSCULAR | Status: DC

## 2021-07-19 MED ORDER — ACETAMINOPHEN 325 MG PO TABS: 650.0000 mg | ORAL_TABLET | Freq: Once | ORAL | Status: DC

## 2021-07-19 MED ORDER — LEVETIRACETAM 500 MG PO TABS: 500.0000 mg | ORAL_TABLET | Freq: Two times a day (BID) | ORAL | 0 refills | Status: DC

## 2021-07-19 MED ORDER — IBUPROFEN 400 MG PO TABS: 600.0000 mg | ORAL_TABLET | Freq: Once | ORAL | Status: DC

## 2021-07-19 MED ORDER — LEVETIRACETAM IN NACL 1500 MG/100ML IV SOLN
1500.0000 mg | Freq: Once | INTRAVENOUS | Status: AC
  Administered 2021-07-19: 1500 mg via INTRAVENOUS
  Filled 2021-07-19: qty 100

## 2021-07-19 NOTE — ED Notes (Signed)
Pt returned from scans.  

## 2021-07-19 NOTE — ED Provider Notes (Signed)
[place] EMERGENCY DEPARTMENT Provider Note   CSN: [id number] Arrival date & time: 07/19/21  1202     History No chief complaint on file.   Meghan [name] is a 55 y.o. female.  Patient presents to ER chief complaint of fall.  She states that her feet got caught on the rug and she lost her balance and fell.  She was able to get up afterwards but bystanders state that she had a seizure-like activity soon afterwards had a second fall.  She is complaining of left-sided pain.  Denies any chest pain.  Denies any recent illnesses fevers vomiting cough or diarrhea.  Patient is on Eliquis for history of atrial fibrillation.      Past Medical History:  Diagnosis Date   Arthritis    left knee   Asthma    Atrial fibrillation [facility])    Atrial fibrillation ([place]) 02/16/2019   CHF (congestive heart failure) ([place]) 01/2019   Physician from DC informed her she had this    Depression    Dysrhythmia    Atrial fibrillation   GERD (gastroesophageal reflux disease)    Hiatal hernia    History of pulmonary embolus (PE)    Hypercholesterolemia    Macular degeneration    of left eye   Osteoarthritis    knees   Pneumonia    in the past   Pre-diabetes    Pulmonary embolism (HCC)    bilat   Seizure ([place])    Seizure disorder ([place]) 2017   Sleep apnea CPAP   [facility]   Stroke ([place])    1989 and 1995 (left sided weakness)   Vitamin D deficiency     Patient Active Problem List   Diagnosis Date Noted   Intractable nausea and vomiting 11/16/2020   Ileus ([name]) 11/14/2020   Fecal impaction ([place]) 11/14/2020   Left-sided weakness 09/29/2020   Asthma 09/29/2020   Pre-diabetes 09/29/2020   Pain in left finger(s) 07/12/2020   Seizure-like activity ([place]) 03/25/2020   History of head injury 03/25/2020   Obstructive sleep apnea syndrome 09/04/2019   Acute medial meniscus tear of right knee 07/10/2019   Class 3 severe obesity due to excess calories with serious comorbidity and body  mass index (BMI) of 50.0 to 59.9 in adult [facility]) 06/01/2019   Atrial fibrillation ([place]) 04/17/2019   History of pulmonary embolus (PE) 04/17/2019   Type 2 diabetes mellitus, without long-term current use of insulin ([place]) 04/17/2019    Past Surgical History:  Procedure Laterality Date   ABDOMINAL HYSTERECTOMY  2003   complete   CARDIAC CATHETERIZATION  2017   in [facility] in [place]  [id number]   x 2   CESAREAN SECTION     x 2   CHOLECYSTECTOMY  2003   COLONOSCOPY WITH PROPOFOL N/A 06/05/2019   Procedure: COLONOSCOPY WITH PROPOFOL;  Surgeon: Carol Ada, MD;  Location: [facility];  Service: Endoscopy;  Laterality: N/A;   CYST REMOVAL WITH BONE GRAFT Left 10/18/2020   Procedure: BONE GRAFTING OF ENCHONDROMA MIDDLE [place] OF LEFT MIDDLE FINGER;  Surgeon: Daryll Brod, MD;  Location: [place];  Service: Orthopedics;  Laterality: Left;  AXILLARY BLOCK   DIAGNOSTIC LAPAROSCOPY  2015; 2017   lap hernia repair x2   HERNIA REPAIR  5681, 2751   umbilical hernia repair   IVC FILTER INSERTION  2003   Hx pulmonary embolus   POLYPECTOMY  06/05/2019   Procedure: POLYPECTOMY;  Surgeon: Carol Ada, MD;  Location: [facility];  Service: Endoscopy;;   SHOULDER ARTHROSCOPY W/ ROTATOR CUFF REPAIR  08/28/2017   right shoulder   WRIST ARTHROSCOPY WITH DEBRIDEMENT Left 10/18/2020   Procedure: LEFT WRIST ARTHROSCOPY WITH DEBRIDEMENT;  Surgeon: Daryll Brod, MD;  Location: [place];  Service: Orthopedics;  Laterality: Left;  AXILLARY BLOCK     OB History   No obstetric history on file.     Family History  Problem Relation Age of Onset   Breast cancer Mother    Breast cancer Maternal Grandmother    Heart attack Father    Diabetes Father    Congestive Heart Failure Father     Social History   Tobacco Use   Smoking status: Never   Smokeless tobacco: Never  Vaping Use   Vaping Use: Never used  Substance Use Topics   Alcohol use: Never   Drug use: Never    Home  Medications Prior to Admission medications   Medication Sig Start Date End Date Taking? Authorizing Provider  levETIRAcetam (KEPPRA) 500 MG tablet Take 1 tablet (500 mg total) by mouth 2 (two) times daily for 10 days. 07/19/21 07/29/21 Yes Luna Fuse, MD  acetaminophen (TYLENOL) 500 MG tablet Take 1,000 mg by mouth every 6 (six) hours as needed for moderate pain.    [provider]  albuterol (VENTOLIN HFA) 108 (90 Base) MCG/ACT inhaler Inhale 2 puffs into the lungs every 6 (six) hours as needed for wheezing or shortness of breath.    [provider]  amoxicillin (AMOXIL) 500 MG tablet Take 1,000 mg by mouth 2 (two) times daily. 7 day supply 07/10/21   [provider]  BREZTRI AEROSPHERE 160-9-4.8 MCG/ACT AERO Inhale 2 puffs into the lungs in the morning and at bedtime.  03/24/20   [provider]  celecoxib (CELEBREX) 200 MG capsule Take 200 mg by mouth daily. 01/02/21   [provider]  Cholecalciferol (VITAMIN D-3) 25 MCG (1000 UT) CAPS Take 1,000 Units by mouth daily.    [provider]  cycloSPORINE (RESTASIS) 0.05 % ophthalmic emulsion Place 1 drop into both eyes 2 (two) times daily.     [provider]  flecainide (TAMBOCOR) 50 MG tablet Take 50 mg by mouth 2 (two) times daily.  09/04/19   [provider]  furosemide (LASIX) 40 MG tablet Take 40 mg by mouth daily with breakfast.     [provider]  levETIRAcetam (KEPPRA) 500 MG tablet Take 1 tablet (500 mg total) by mouth 2 (two) times daily. 07/04/21   Marcial Pacas, MD  meclizine (ANTIVERT) 25 MG tablet Take 1 tablet (25 mg total) by mouth 3 (three) times daily as needed for dizziness. 01/11/21   Domenic Moras, PA-C  metFORMIN (GLUCOPHAGE) 500 MG tablet Take 500 mg by mouth daily with breakfast.    [provider]  methocarbamol (ROBAXIN) 500 MG tablet Take 1 tablet (500 mg total) by mouth 2 (two) times daily. Patient taking differently: Take 500 mg by  mouth 2 (two) times daily as needed for muscle spasms. 12/14/20   Tedd Sias, PA  metoCLOPramide (REGLAN) 5 MG tablet Take 5 mg by mouth 3 (three) times daily. 01/02/21   [provider]  metoprolol succinate (TOPROL-XL) 25 MG 24 hr tablet Take 25 mg by mouth daily. 05/02/21   [provider]  Multiple Vitamins-Minerals (PRESERVISION AREDS 2 PO) Take 2 capsules by mouth daily with breakfast.     [provider]  Omega-3 Fatty Acids ([place])  1000 MG CAPS Take 1,000 mg by mouth daily with breakfast.     [provider]  omeprazole (PRILOSEC) 40 MG capsule Take 40 mg by mouth daily. 08/17/20   [provider]  OZEMPIC, 1 MG/DOSE, 4 MG/3ML SOPN Inject 1 mg into the skin once a week. Sundays 10/24/20   [provider]  OZEMPIC, 2 MG/DOSE, 8 MG/3ML SOPN Inject into the skin. 05/19/21   [provider]  rivaroxaban (XARELTO) 20 MG TABS tablet Take 20 mg by mouth at bedtime.     [provider]  sertraline (ZOLOFT) 50 MG tablet Take 50 mg by mouth daily. 01/01/21   [provider]    Allergies    Acetaminophen-codeine, Other, Tramadol, Codeine, Coconut oil, Meloxicam, and Tomato  Review of Systems   Review of Systems  Constitutional:  Negative for fever.  HENT:  Negative for ear pain.   Eyes:  Negative for pain.  Respiratory:  Negative for cough.   Cardiovascular:  Negative for chest pain.  Gastrointestinal:  Negative for abdominal pain.  Genitourinary:  Negative for flank pain.  Musculoskeletal:  Negative for back pain.  Skin:  Negative for rash.  Neurological:  Positive for headaches.   Physical Exam Updated Vital Signs BP 100/69   Pulse (!) 57   Temp (!) 96.5 F (35.8 C) (Oral)   Resp (!) 21   SpO2 96%   Physical Exam Constitutional:      General: She is not in acute distress.    Appearance: Normal appearance.  HENT:     Head: Normocephalic.     Nose: Nose normal.  Eyes:     Extraocular Movements:  Extraocular movements intact.  Cardiovascular:     Rate and Rhythm: Normal rate.  Pulmonary:     Effort: Pulmonary effort is normal.  Musculoskeletal:        General: Normal range of motion.     Cervical back: Normal range of motion.     Comments: Tenderness pain to the left mid humerus, left knee, left hip.  Otherwise neurovascular intact extremities.  Compartments are soft.  Neurological:     General: No focal deficit present.     Mental Status: She is alert and oriented to person, place, and time. Mental status is at baseline.     Cranial Nerves: No cranial nerve deficit.     Motor: No weakness.    ED Results / Procedures / Treatments   Labs (all labs ordered are listed, but only abnormal results are displayed) Labs Reviewed  COMPREHENSIVE METABOLIC PANEL - Abnormal; Notable for the following components:      Result Value   Albumin 3.4 (*)    Alkaline Phosphatase 175 (*)    All other components within normal limits  CBC - Abnormal; Notable for the following components:   Hemoglobin 11.5 (*)    All other components within normal limits  I-STAT CHEM 8, ED - Abnormal; Notable for the following components:   Hemoglobin 11.9 (*)    HCT 35.0 (*)    All other components within normal limits  URINALYSIS, ROUTINE W REFLEX MICROSCOPIC    EKG None  Radiology DG Chest 1 View  Result Date: 07/19/2021 CLINICAL DATA:  Trauma, fall EXAM: CHEST  1 VIEW COMPARISON:  Chest x-ray 02/01/2021 FINDINGS: Heart size and mediastinal contours are within normal limits. No suspicious pulmonary opacities identified. Mildly low lung volumes. No pleural effusion or pneumothorax visualized. No acute osseous abnormality appreciated. IMPRESSION: No acute intrathoracic process identified.  Electronically Signed   By: Ofilia Neas M.D.   On: 07/19/2021 13:43   DG Pelvis 1-2 Views  Result Date: 07/19/2021 CLINICAL DATA:  Fall today ,having pain lt knee and humerus,,no chest comp sore all over EXAM:  PELVIS - 1-2 VIEW COMPARISON:  None. FINDINGS: There is no evidence of pelvic fracture or diastasis. No pelvic bone lesions are seen. IMPRESSION: Negative. Electronically Signed   By: Lajean Manes M.D.   On: 07/19/2021 13:27   CT HEAD WO CONTRAST  Result Date: 07/19/2021 CLINICAL DATA:  Fall, head and neck trauma, on blood thinners EXAM: CT HEAD WITHOUT CONTRAST CT CERVICAL SPINE WITHOUT CONTRAST TECHNIQUE: Multidetector CT imaging of the head and cervical spine was performed following the standard protocol without intravenous contrast. Multiplanar CT image reconstructions of the cervical spine were also generated. COMPARISON:  11/06/2020 FINDINGS: CT HEAD FINDINGS Brain: No evidence of acute infarction, hemorrhage, hydrocephalus, extra-axial collection or mass lesion/mass effect. Vascular: No hyperdense vessel or unexpected calcification. Skull: Normal. Negative for fracture or focal lesion. Sinuses/Orbits: No acute finding. Other: None. CT CERVICAL SPINE FINDINGS Alignment: Normal. Skull base and vertebrae: No acute fracture. No primary bone lesion or focal pathologic process. Soft tissues and spinal canal: No prevertebral fluid or swelling. No visible canal hematoma. Disc levels: Mild-to-moderate disc space height loss and osteophytosis of the lower cervical levels. Upper chest: Negative. Other: None. IMPRESSION: 1. No acute intracranial pathology. 2. No fracture or static subluxation of the cervical spine. 3. Mild-to-moderate disc space height loss and osteophytosis of the lower cervical levels. Electronically Signed   By: Delanna Ahmadi M.D.   On: 07/19/2021 13:18   CT Cervical Spine Wo Contrast  Result Date: 07/19/2021 CLINICAL DATA:  Fall, head and neck trauma, on blood thinners EXAM: CT HEAD WITHOUT CONTRAST CT CERVICAL SPINE WITHOUT CONTRAST TECHNIQUE: Multidetector CT imaging of the head and cervical spine was performed following the standard protocol without intravenous contrast. Multiplanar CT  image reconstructions of the cervical spine were also generated. COMPARISON:  11/06/2020 FINDINGS: CT HEAD FINDINGS Brain: No evidence of acute infarction, hemorrhage, hydrocephalus, extra-axial collection or mass lesion/mass effect. Vascular: No hyperdense vessel or unexpected calcification. Skull: Normal. Negative for fracture or focal lesion. Sinuses/Orbits: No acute finding. Other: None. CT CERVICAL SPINE FINDINGS Alignment: Normal. Skull base and vertebrae: No acute fracture. No primary bone lesion or focal pathologic process. Soft tissues and spinal canal: No prevertebral fluid or swelling. No visible canal hematoma. Disc levels: Mild-to-moderate disc space height loss and osteophytosis of the lower cervical levels. Upper chest: Negative. Other: None. IMPRESSION: 1. No acute intracranial pathology. 2. No fracture or static subluxation of the cervical spine. 3. Mild-to-moderate disc space height loss and osteophytosis of the lower cervical levels. Electronically Signed   By: Delanna Ahmadi M.D.   On: 07/19/2021 13:18   CT Lumbar Spine Wo Contrast  Result Date: 07/19/2021 CLINICAL DATA:  Fall, blood thinners EXAM: CT LUMBAR SPINE WITHOUT CONTRAST CT PELVIS WITHOUT CONTRAST TECHNIQUE: Multidetector CT imaging of the lumbar spine was performed without intravenous contrast administration. Multiplanar CT image reconstructions were also generated. COMPARISON:  None. FINDINGS: Alignment: Normal Vertebrae: No fracture or dislocation. Vertebral body heights are intact Disc levels: Minimal multilevel disc space height loss of the lower lumbar spine. Mild multilevel facet degenerative change Bony pelvis: No displaced fracture or dislocation. Mild pubic symphysis arthrosis. Paraspinal and other soft tissues: No hematoma or other acute traumatic abnormality. Other: Infrarenal IVC filter.  Status post hysterectomy. IMPRESSION: 1. No fracture  or dislocation of the lumbar spine. Mild multilevel disc degenerative disease. 2.  No displaced fracture or dislocation of the pelvis. 3. No hematoma in the included portions of the abdomen and pelvis. Electronically Signed   By: Delanna Ahmadi M.D.   On: 07/19/2021 13:26   CT PELVIS WO CONTRAST  Result Date: 07/19/2021 CLINICAL DATA:  Fall, blood thinners EXAM: CT LUMBAR SPINE WITHOUT CONTRAST CT PELVIS WITHOUT CONTRAST TECHNIQUE: Multidetector CT imaging of the lumbar spine was performed without intravenous contrast administration. Multiplanar CT image reconstructions were also generated. COMPARISON:  None. FINDINGS: Alignment: Normal Vertebrae: No fracture or dislocation. Vertebral body heights are intact Disc levels: Minimal multilevel disc space height loss of the lower lumbar spine. Mild multilevel facet degenerative change Bony pelvis: No displaced fracture or dislocation. Mild pubic symphysis arthrosis. Paraspinal and other soft tissues: No hematoma or other acute traumatic abnormality. Other: Infrarenal IVC filter.  Status post hysterectomy. IMPRESSION: 1. No fracture or dislocation of the lumbar spine. Mild multilevel disc degenerative disease. 2. No displaced fracture or dislocation of the pelvis. 3. No hematoma in the included portions of the abdomen and pelvis. Electronically Signed   By: Delanna Ahmadi M.D.   On: 07/19/2021 13:26   DG Knee Complete 4 Views Left  Result Date: 07/19/2021 CLINICAL DATA:  Fall EXAM: LEFT KNEE - COMPLETE 4 VIEW COMPARISON:  02/18/2020 FINDINGS: No evidence of fracture, dislocation, or joint effusion. Redemonstrated moderate to severe narrowing of the patellofemoral joint and moderate narrowing of the medial compartment, with medial and patellar spurring. Soft tissues are unremarkable. IMPRESSION: Negative. Electronically Signed   By: Merilyn Baba M.D.   On: 07/19/2021 13:27   DG Humerus Left  Result Date: 07/19/2021 CLINICAL DATA:  Fall, pain in LEFT knee and humerus. EXAM: LEFT HUMERUS - 2+ VIEW COMPARISON:  Shoulder evaluation of February  2022. FINDINGS: IV in the soft tissues over the antecubital fossa. No gross soft tissue abnormality. No sign of acute fracture. IMPRESSION: No acute bone abnormality. Electronically Signed   By: Zetta Bills M.D.   On: 07/19/2021 13:26    Procedures .Critical Care Performed by: Luna Fuse, MD Authorized by: Luna Fuse, MD   Critical care provider statement:    Critical care time (minutes):  40   Critical care time was exclusive of:  Separately billable procedures and treating other patients and teaching time   Critical care was necessary to treat or prevent imminent or life-threatening deterioration of the following conditions:  Trauma Comments:     Level 2 trauma activation   Medications Ordered in ED Medications  fentaNYL (SUBLIMAZE) injection 50 mcg (50 mcg Intravenous Not Given 07/19/21 1341)  fentaNYL (SUBLIMAZE) injection 50 mcg (50 mcg Intravenous Not Given 07/19/21 1217)  acetaminophen (TYLENOL) tablet 650 mg (has no administration in time range)  ibuprofen (ADVIL) tablet 600 mg (has no administration in time range)  fentaNYL (SUBLIMAZE) 100 MCG/2ML injection (50 mcg  Given 07/19/21 1217)  ondansetron (ZOFRAN) 4 MG/2ML injection (4 mg  Given 07/19/21 1217)  levETIRAcetam (KEPPRA) IVPB 1500 mg/ 100 mL premix (0 mg Intravenous Stopped 07/19/21 1354)  fentaNYL (SUBLIMAZE) 100 MCG/2ML injection (50 mcg  Given 07/19/21 1341)    ED Course  I have reviewed the triage vital signs and the nursing notes.  Pertinent labs & imaging results that were available during my care of the patient were reviewed by me and considered in my medical decision making (see chart for details).    MDM Rules/Calculators/A&P  Given fall, head injury on blood thinners patient was a trauma activation.  I was called emergently to her bedside.  Labs are sent, these are unremarkable within normal limits.  Multiple images and scans performed with no acute findings.  Patient given  medication for pain management.  Will recommend outpatient follow-up with her doctor within the week.  Recommend immediate return for worsening pain fevers or additional concerns.   Patient states that she has been off of her seizure medications for a few days.  She was loaded with Keppra here again.  Advise continued adherence to her medications at home.  Final Clinical Impression(s) / ED Diagnoses Final diagnoses:  Fall  Seizure [facility])    Rx / DC Orders ED Discharge Orders          Ordered    levETIRAcetam (KEPPRA) 500 MG tablet  2 times daily        07/19/21 1419             Luna Fuse, MD 07/19/21 1420

## 2021-07-19 NOTE — ED Notes (Signed)
Patient transported to CT 

## 2021-07-19 NOTE — Progress Notes (Signed)
Orthopedic Tech Progress Note Patient Details:  Meghan [name] Oct 26, 1965 [id number]  Level 2 trauma   Patient ID: Meghan [name], female   DOB: 08/25/1966, 55 y.o.   MRN: [id number]  Meghan [name] 07/19/2021, 12:14 PM

## 2021-07-19 NOTE — Discharge Instructions (Signed)
SEIZURE PRECAUTIONS °Per [place] DMV statutes, patients with seizures are not allowed to drive until they have been seizure-free for six months. °  °Use caution when using heavy equipment or power tools. Avoid working on ladders or at heights. Take showers instead of baths. Ensure the water temperature is not too high on the home water heater. Do not go swimming alone. Do not lock yourself in a room alone (i.e. bathroom). When caring for infants or small children, sit down when holding, feeding, or changing them to minimize risk of injury to the child in the event you have a seizure. Maintain good sleep hygiene. Avoid alcohol. °  °If patient has another seizure, call 911 and bring them back to the ED if: °A.  The seizure lasts longer than 5 minutes.      °B.  The patient doesn't wake shortly after the seizure or has new problems such as difficulty seeing, speaking or moving following the seizure °C.  The patient was injured during the seizure °D.  The patient has a temperature over 102 F (39C) °E.  The patient vomited during the seizure and now is having trouble breathing ° ° °

## 2021-07-20 ENCOUNTER — Ambulatory Visit (INDEPENDENT_AMBULATORY_CARE_PROVIDER_SITE_OTHER): Payer: Medicare Other | Admitting: Licensed Clinical Social Worker

## 2021-07-20 ENCOUNTER — Other Ambulatory Visit: Payer: Self-pay

## 2021-07-20 DIAGNOSIS — F331 Major depressive disorder, recurrent, moderate: Secondary | ICD-10-CM

## 2021-07-20 DIAGNOSIS — F411 Generalized anxiety disorder: Secondary | ICD-10-CM | POA: Diagnosis not present

## 2021-07-20 DIAGNOSIS — F4310 Post-traumatic stress disorder, unspecified: Secondary | ICD-10-CM

## 2021-07-20 NOTE — Progress Notes (Signed)
Virtual Visit via Video Note   I connected with Meghan [name] on 07/20/21 at 3:00pm by video enabled telemedicine application and verified that I am speaking with the correct person using two identifiers.   I discussed the limitations, risks, security and privacy concerns of performing an evaluation and management service by video and the availability of in person appointments. I also discussed with the patient that there may be a patient responsible charge related to this service. The patient expressed understanding and agreed to proceed.   I discussed the assessment and treatment plan with the patient. The patient was provided an opportunity to ask questions and all were answered. The patient agreed with the plan and demonstrated an understanding of the instructions.   The patient was advised to call back or seek an in-person evaluation if the symptoms worsen or if the condition fails to improve as anticipated.   I provided 45 minutes of non-face-to-face time during this encounter.     [name], LCSW, LCAS _______________________ THERAPIST PROGRESS NOTE   Session Time: 3:00pm - 3:45pm   Location: Patient: Patient home    Provider: [facility] [place] [facility]    Participation Level: Active    Behavioral Response: Alert, casually dressed, irritable mood/affect     Type of Therapy:  Individual Therapy   Treatment Goals addressed: Mood management; Medication management    Interventions: CBT, communication skills      Summary: Meghan [name] is a 55 year old divorced African American female that presented for therapy appointment today with diagnoses of Major Depressive Disorder, recurrent, moderate, Generalized Anxiety Disorder and PTSD.      Suicidal/Homicidal: None; without plan or intent                                                                                                                        Therapist Response:  Clinician met with Meghan [name] for virtual therapy session and assessed for  safety, sobriety, and medication compliance.  Meghan [name] presented for appointment on time and spoke in a manner that was alert, oriented x5, with no evidence or self-report of active SI/HI or A/V H.  Meghan [name] reported ongoing compliance with medication and denied any use of alcohol or illicit substances.  Clinician inquired about Meghan [name]'s emotional ratings today, as well as any significant changes in thoughts, feelings, or behavior since last check-in.  Lean reported scores of 0/10 for depression, 0/10 for anxiety, and 6/10 for anger/irritability.  Meghan [name] also denied experiencing any recent panic attacks.  Ayleah reported that yesterday she had a fall when she was taking her trash out of the home and called her neighbor to help her.  Meghan [name] reported that when the neighbor couldn't help, they called an ambulance, and upon discharge from the hospital her sister picked her up, but accused her of 'staging' this incident for attention, which led to a heated argument during the car ride home.  Meghan [name] stated "It irritated me that she thought I would do that to  myself intentionally".  Clinician covered material with Meghan [name] today on communication skills which could be utilized to increase understanding and support within the relationship.  Clinician presented a handout on 'soft startups' which offered suggestions on how Meghan [name] could address recent issue with her sister, including tips such as choosing an appropriate time/setting, being mindful of maintaining gentle tone, volume and language, while avoiding triggering nonverbals such as rolling eyes, as well as utilizing "I" statements to express feelings, focusing on one problem at a time, and being respectful.  Intervention was effective, as evidenced by Meghan [name] actively participating in discussion on the subject and identifying several changes that could have been made to help de-escalate and increase empathy, including lowering her voice, delaying the conversation until they did not  have distractions, and felt more emotionally and physically stable.  Meghan [name] was also able to practice "I" statements in this session to express herself more effectively.  Clinician will continue to monitor.                                      Plan: Follow up again in 1 week.    Diagnosis: Major depressive disorder, recurrent, moderate; Generalized Anxiety Disorder; and PTSD   [name], LCSW, LCAS 07/20/21

## 2021-07-22 ENCOUNTER — Other Ambulatory Visit: Payer: Self-pay

## 2021-07-22 DIAGNOSIS — I872 Venous insufficiency (chronic) (peripheral): Secondary | ICD-10-CM

## 2021-07-24 ENCOUNTER — Ambulatory Visit (INDEPENDENT_AMBULATORY_CARE_PROVIDER_SITE_OTHER): Payer: Medicare Other | Admitting: Licensed Clinical Social Worker

## 2021-07-24 ENCOUNTER — Other Ambulatory Visit: Payer: Self-pay

## 2021-07-24 DIAGNOSIS — F4310 Post-traumatic stress disorder, unspecified: Secondary | ICD-10-CM

## 2021-07-24 DIAGNOSIS — F411 Generalized anxiety disorder: Secondary | ICD-10-CM

## 2021-07-24 DIAGNOSIS — F331 Major depressive disorder, recurrent, moderate: Secondary | ICD-10-CM
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
## 2021-07-31 ENCOUNTER — Ambulatory Visit (INDEPENDENT_AMBULATORY_CARE_PROVIDER_SITE_OTHER): Payer: Medicare Other | Admitting: Licensed Clinical Social Worker

## 2021-07-31 ENCOUNTER — Other Ambulatory Visit: Payer: Self-pay

## 2021-07-31 DIAGNOSIS — F331 Major depressive disorder, recurrent, moderate: Secondary | ICD-10-CM

## 2021-07-31 DIAGNOSIS — F4310 Post-traumatic stress disorder, unspecified: Secondary | ICD-10-CM

## 2021-07-31 DIAGNOSIS — F411 Generalized anxiety disorder: Secondary | ICD-10-CM

## 2021-07-31 NOTE — Progress Notes (Signed)
Virtual Visit via Video Note   I connected with Meghan [name] on 07/31/21 at 2:00pm by video enabled telemedicine application and verified that I am speaking with the correct person using two identifiers.   I discussed the limitations, risks, security and privacy concerns of performing an evaluation and management service by video and the availability of in person appointments. I also discussed with the patient that there may be a patient responsible charge related to this service. The patient expressed understanding and agreed to proceed.   I discussed the assessment and treatment plan with the patient. The patient was provided an opportunity to ask questions and all were answered. The patient agreed with the plan and demonstrated an understanding of the instructions.   The patient was advised to call back or seek an in-person evaluation if the symptoms worsen or if the condition fails to improve as anticipated.   I provided 30 minutes of non-face-to-face time during this encounter.     [name], LCSW, LCAS _______________________ THERAPIST PROGRESS NOTE   Session Time: 2:00pm - 2:30pm      Location: Patient: Patient home    Provider: [facility]    Participation Level: Active    Behavioral Response: Alert, casually dressed, euthymic mood/affect   Type of Therapy:  Individual Therapy   Treatment Goals addressed: Mood management; Medication management; Church attendance     Interventions: CBT: challenging core beliefs, problem solving       Summary: Meghan [name] is a 55 year old divorced African American female that presented for therapy appointment today with diagnoses of Major Depressive Disorder, recurrent, moderate, Generalized Anxiety Disorder and PTSD.      Suicidal/Homicidal: None; without plan or intent                                                                                                                Therapist Response:  Clinician met with Meghan [name] for  virtual therapy session and assessed for safety, sobriety, and medication compliance.  Meghan [name] presented for appointment on time and spoke in a manner that was alert, oriented x5, with no evidence or self-report of active SI/HI or A/V H.  Meghan [name] reported ongoing compliance with medication and denied any use of alcohol or illicit substances.  Clinician inquired about Kaylise's emotional ratings today, as well as any significant changes in thoughts, feelings, or behavior since last check-in.  Meghan [name] reported scores of 0/10 for depression, 0/10 for anxiety, and 0/10 for anger/irritability.  Meghan [name] also denied experiencing any recent panic attacks or outbursts.  Meghan [name] reported that a recent success was traveling to [place] to see a friend and catch up on things.  She reported that she has also stayed busy with doctor's appointments.  Meghan [name] reported that her biggest issue at present is trying to determine whether she can continue visiting her home church due to distance from her, and need to rely on support system for car rides.  Clinician assisted Meghan [name] in running cost benefit analysis regarding whether to continue to  attend this church versus seeking one closer to home.  Clinician also utilized problem solving approach to find alternative transportation options for [place] that could allow her to still retain home church.  Meghan [name] participated in analysis and discussion on the subject and disclosed that no one in her support network has actually informed her that driving her to the church is a problem for them, but she feels guilty for having to rely upon them.  Meghan [name] reported that it would likely have a negative impact upon her mental health if she were to stop attending and much of this stemmed from speculation that she is a burden for others.  Clinician discussed subject of core beliefs with Meghan [name], explaining how negative ones such as "I am a burden" can affect one's thoughts, feelings, and behavior.  Clinician assisted Meghan [name]  in challenging this core belief by identifying evidence which contradicted it.  Intervention was effective, as evidenced by Meghan [name] weakening this core belief by recognizing the value she offers to her support network and the church, in addition to expressing motivation to continue attending weekly church sermons and events based upon positive effect this has had upon her mental health.  Meghan [name] reported that she needed to end session at halfway point because a friend had shown up earlier than anticipated and she could not ensure confidentiality.  Clinician will continue to monitor.                                      Plan: Follow up again in 1 week.    Diagnosis: Major depressive disorder, recurrent, moderate; Generalized Anxiety Disorder; and PTSD   [name], LCSW, LCAS 07/31/21

## 2021-08-01 ENCOUNTER — Other Ambulatory Visit: Payer: Self-pay | Admitting: Endocrinology

## 2021-08-01 DIAGNOSIS — Z1231 Encounter for screening mammogram for malignant neoplasm of breast: Secondary | ICD-10-CM

## 2021-08-07 ENCOUNTER — Ambulatory Visit (HOSPITAL_COMMUNITY): Payer: Medicare Other | Admitting: Licensed Clinical Social Worker

## 2021-08-08 ENCOUNTER — Ambulatory Visit (INDEPENDENT_AMBULATORY_CARE_PROVIDER_SITE_OTHER): Payer: Medicare Other | Admitting: Physician Assistant

## 2021-08-08 ENCOUNTER — Other Ambulatory Visit: Payer: Self-pay

## 2021-08-08 ENCOUNTER — Ambulatory Visit (HOSPITAL_COMMUNITY)
Admission: RE | Admit: 2021-08-08 | Discharge: 2021-08-08 | Disposition: A | Discharge status: Home / Self Care | Payer: Medicare Other | Source: Ambulatory Visit | Attending: Vascular Surgery | Admitting: Vascular Surgery

## 2021-08-08 ENCOUNTER — Encounter: Payer: Self-pay | Admitting: Physician Assistant

## 2021-08-08 VITALS — BP 116/71 | HR 62 | Temp 98.1°F | Resp 20 | Ht 65.0 in | Wt 293.7 lb

## 2021-08-08 DIAGNOSIS — I872 Venous insufficiency (chronic) (peripheral): Secondary | ICD-10-CM

## 2021-08-08 NOTE — Progress Notes (Signed)
[facility]     History of Present Illness  Meghan [name] is a 55 y.o. female who presents with chief complaint: increased edema over the past year she has trouble walking due to heavy achy left LE.  She denise weeping, non healing wounds and or skin changes.  She denise history of DVT.  She was seen by DR. Scot Dock 06/30/20 for  painful varicose veins and left lower extremity swelling.  Patient had had varicose veins for approximately 3 years but was developing increasing pain especially in the left leg that began about a month prior.  The patient has had a history of a left lower extremity DVT.  The patient also had a pulmonary embolus back in the 1990s and again in 2003.  She had an IVC filter placed.  She is on Xarelto for atrial fibrillation.  She was having significant aching pain and heaviness in her legs that was aggravated by sitting and standing.  Her symptoms were relieved with elevation.  Compression therapy did not help her symptoms.  She has had no previous venous procedures.     Past Medical History:  Diagnosis Date   Arthritis    left knee   Asthma    Atrial fibrillation [facility])    Atrial fibrillation ([place]) 02/16/2019   CHF (congestive heart failure) ([place]) 01/2019   Physician from [place] informed her she had this    Depression    Dysrhythmia    Atrial fibrillation   GERD (gastroesophageal reflux disease)    Hiatal hernia    History of pulmonary embolus ([place])    Hypercholesterolemia    Macular degeneration    of left eye   Osteoarthritis    knees   Pneumonia    in the past   Pre-diabetes    Pulmonary embolism ([place])    bilat   Seizure ([place])    Seizure disorder ([place]) 2017   Sleep apnea CPAP   [facility]   Stroke ([place])    1989 and 1995 (left sided weakness)   Vitamin D deficiency     Past Surgical History:  Procedure Laterality Date   ABDOMINAL HYSTERECTOMY  2003   complete   CARDIAC CATHETERIZATION  2017   in [facility] in [place]  [id number]   x 2   CESAREAN SECTION     x 2   CHOLECYSTECTOMY  2003   COLONOSCOPY WITH PROPOFOL N/A 06/05/2019   Procedure: COLONOSCOPY WITH PROPOFOL;  Surgeon: Carol Ada, MD;  Location: [facility];  Service: Endoscopy;  Laterality: N/A;   CYST REMOVAL WITH BONE GRAFT Left 10/18/2020   Procedure: BONE GRAFTING OF ENCHONDROMA MIDDLE [place] OF LEFT MIDDLE FINGER;  Surgeon: Daryll Brod, MD;  Location: [facility];  Service: Orthopedics;  Laterality: Left;  AXILLARY BLOCK   DIAGNOSTIC LAPAROSCOPY  2015; 2017   lap hernia repair x2   HERNIA REPAIR  [id number], [id number]   umbilical hernia repair   IVC FILTER INSERTION  2003   Hx pulmonary embolus   POLYPECTOMY  06/05/2019   Procedure: POLYPECTOMY;  Surgeon: Carol Ada, MD;  Location: [facility];  Service: Endoscopy;;   SHOULDER ARTHROSCOPY W/ ROTATOR CUFF REPAIR  08/28/2017   right shoulder   WRIST ARTHROSCOPY WITH DEBRIDEMENT Left 10/18/2020   Procedure: LEFT WRIST ARTHROSCOPY WITH DEBRIDEMENT;  Surgeon: Daryll Brod, MD;  Location: [facility];  Service: Orthopedics;  Laterality: Left;  AXILLARY BLOCK    Social History   Socioeconomic History  Marital status: Divorced    Spouse name: Not on file   Number of children: 3   Years of education: Not on file   Highest education level: Not on file  Occupational History   Not on file  Tobacco Use   Smoking status: Never   Smokeless tobacco: Never  Vaping Use   Vaping Use: Never used  Substance and Sexual Activity   Alcohol use: Never   Drug use: Never   Sexual activity: Not on file  Other Topics Concern   Not on file  Social History Narrative   ** Merged History Encounter **       Social Determinants of Health   Financial Resource Strain: Not on file  Food Insecurity: Not on file  Transportation Needs: Not on file  Physical Activity: Not on file  Stress: Not on file  Social Connections: Not on file  Intimate Partner Violence: Not on file    Family  History  Problem Relation Age of Onset   Breast cancer Mother    Breast cancer Maternal Grandmother    Heart attack Father    Diabetes Father    Congestive Heart Failure Father     Current Outpatient Medications on File Prior to Visit  Medication Sig Dispense Refill   acetaminophen (TYLENOL) 500 MG tablet Take 1,000 mg by mouth every 6 (six) hours as needed for moderate pain.     albuterol (VENTOLIN HFA) 108 (90 Base) MCG/ACT inhaler Inhale 2 puffs into the lungs every 6 (six) hours as needed for wheezing or shortness of breath.     BREZTRI AEROSPHERE 160-9-4.8 MCG/ACT AERO Inhale 2 puffs into the lungs in the morning and at bedtime.      celecoxib (CELEBREX) 200 MG capsule Take 200 mg by mouth daily.     Cholecalciferol (VITAMIN D-3) 25 MCG (1000 UT) CAPS Take 1,000 Units by mouth daily.     cycloSPORINE (RESTASIS) 0.05 % ophthalmic emulsion Place 1 drop into both eyes 2 (two) times daily.      flecainide (TAMBOCOR) 50 MG tablet Take 50 mg by mouth 2 (two) times daily.      furosemide (LASIX) 40 MG tablet Take 40 mg by mouth daily with breakfast.      levETIRAcetam (KEPPRA) 500 MG tablet Take 1 tablet (500 mg total) by mouth 2 (two) times daily. 180 tablet 3   meclizine (ANTIVERT) 25 MG tablet Take 1 tablet (25 mg total) by mouth 3 (three) times daily as needed for dizziness. 30 tablet 0   metFORMIN (GLUCOPHAGE) 500 MG tablet Take 500 mg by mouth daily with breakfast.     methocarbamol (ROBAXIN) 500 MG tablet Take 1 tablet (500 mg total) by mouth 2 (two) times daily. (Patient taking differently: Take 500 mg by mouth 2 (two) times daily as needed for muscle spasms.) 20 tablet 0   metoCLOPramide (REGLAN) 5 MG tablet Take 5 mg by mouth 3 (three) times daily.     metoprolol succinate (TOPROL-XL) 25 MG 24 hr tablet Take 25 mg by mouth daily.     Multiple Vitamins-Minerals (PRESERVISION AREDS 2 PO) Take 2 capsules by mouth daily with breakfast.      Omega-3 Fatty Acids (FISH OIL) 1000 MG CAPS  Take 1,000 mg by mouth daily with breakfast.      omeprazole (PRILOSEC) 40 MG capsule Take 40 mg by mouth daily.     rivaroxaban (XARELTO) 20 MG TABS tablet Take 20 mg by mouth at bedtime.  sertraline (ZOLOFT) 50 MG tablet Take 50 mg by mouth daily.     No current facility-administered medications on file prior to visit.    Allergies as of 08/08/2021 - Review Complete 07/04/2021  Allergen Reaction Noted   Acetaminophen-codeine Swelling, Rash, and Other (See Comments) 06/02/2019   Other Hives and Other (See Comments) 11/27/2019   Tramadol Hives and Other (See Comments) 10/18/2020   Codeine Swelling 01/11/2021   Coconut oil Rash and Other (See Comments) 06/02/2019   Meloxicam Rash 03/24/2020   Tomato Rash 06/02/2019     ROS:   General:  No weight loss, Fever, chills  HEENT: No recent headaches, no nasal bleeding, no visual changes, no sore throat  Neurologic: No dizziness, blackouts, seizures. No recent symptoms of stroke or mini- stroke. No recent episodes of slurred speech, or temporary blindness.  Cardiac: No recent episodes of chest pain/pressure, no shortness of breath at rest.  No shortness of breath with exertion.  Denies history of atrial fibrillation or irregular heartbeat  Vascular: No history of rest pain in feet.  No history of claudication.  No history of non-healing ulcer, No history of DVT   Pulmonary: No home oxygen, no productive cough, no hemoptysis,  No asthma or wheezing  Musculoskeletal:  [ ]  Arthritis, [ ]  Low back pain,  [ x] Joint pain  Hematologic:No history of hypercoagulable state.  No history of easy bleeding.  No history of anemia  Gastrointestinal: No hematochezia or melena,  No gastroesophageal reflux, no trouble swallowing  Urinary: [ ]  chronic Kidney disease, [ ]  on HD - [ ]  MWF or [ ]  TTHS, [ ]  Burning with urination, [ ]  Frequent urination, [ ]  Difficulty urinating;   Skin: No rashes  Psychological: No history of anxiety,  No history of  depression  Physical Examination  There were no vitals filed for this visit.  There is no height or weight on file to calculate BMI.  General:  Alert and oriented, no acute distress HEENT: Normal Neck: No bruit or JVD Pulmonary: Clear to auscultation bilaterally Cardiac: Regular Rate and Rhythm without murmur Abdomen: Soft, non-tender, non-distended, no mass, no scars Skin: No rash Extremity Pulses:  2+ radial, brachial, femoral, dorsalis pedis, pulses bilaterally Musculoskeletal: No deformity or edema  Neurologic: Upper and lower extremity motor 5/5 and symmetric  DATA: Venous Reflux Times  +-----+---------+------+-----------+------------+--------+  RIGHTReflux NoRefluxReflux TimeDiameter cmsComments                 Yes                                   +-----+---------+------+-----------+------------+--------+                                    0.650              +-----+---------+------+-----------+------------+--------+      +---------------+---------+------+-----------+------------+--------+  LEFT           Reflux NoRefluxReflux TimeDiameter cmsComments                           Yes                                   +---------------+---------+------+-----------+------------+--------+  CFV  yes   >1 second                       +---------------+---------+------+-----------+------------+--------+  FV prox                  yes   >1 second                       +---------------+---------+------+-----------+------------+--------+  FV mid                   yes   >1 second                       +---------------+---------+------+-----------+------------+--------+  FV dist                  yes   >1 second                       +---------------+---------+------+-----------+------------+--------+  Popliteal                yes   >1 second                        +---------------+---------+------+-----------+------------+--------+  GSV at [facility]               yes    >500 ms     0.725              +---------------+---------+------+-----------+------------+--------+  GSV prox thigh           yes    >500 ms     0.547              +---------------+---------+------+-----------+------------+--------+  GSV mid thigh            yes    >500 ms     0.520              +---------------+---------+------+-----------+------------+--------+  GSV dist thigh           yes    >500 ms     0.595              +---------------+---------+------+-----------+------------+--------+  GSV at knee              yes    >500 ms     0.748              +---------------+---------+------+-----------+------------+--------+  GSV prox calf                               0.675              +---------------+---------+------+-----------+------------+--------+  GSV mid calf                                0.665              +---------------+---------+------+-----------+------------+--------+  SSV Pop Fossa            yes    >500 ms     0.704              +---------------+---------+------+-----------+------------+--------+  SSV prox calf            yes    >500 ms     0.564              +---------------+---------+------+-----------+------------+--------+  SSV mid calf                                0.273              +---------------+---------+------+-----------+------------+--------+  AAGSV at [facility]             yes    >500 ms     0.978              +---------------+---------+------+-----------+------------+--------+  AAGSV prx thigh                 >500 ms     0.629              +---------------+---------+------+-----------+------------+--------+  AAGSV mid thigh          yes    >500 ms     0.650              +---------------+---------+------+-----------+------------+--------+          Summary:  Left:   - No evidence of deep vein thrombosis seen in the left lower extremity,  from the common femoral through the popliteal veins.  - No evidence of superficial venous thrombosis in the left lower  extremity.  - Venous reflux is noted in the left common femoral vein.  - Venous reflux is noted in the left sapheno-femoral junction.  - Venous reflux is noted in the left greater saphenous vein in the thigh.  - Venous reflux is noted in the left femoral vein.  - Venous reflux is noted in the left popliteal vein.  - Venous reflux is noted in the left short saphenous vein.     Assessment/Plan: Left LE venous reflux that has progressed over the past year.  He symptoms have gotten worse as well.  She has been using thigh high compression for the lat year and elevation.  She states she has increased pain as the day goes by due to increased swelling.  She has palpable pedal pulse and is not at risk of limb loss.  He venous duplex shows no DVT, positive reflux from the SFJ to the popliteal vein with size > 0.4 throughout.  She has Deep reflux as well in the common femoral vein.    Her previous duplex did not have reflux throughout the GSV.  She was given a conservative treatment plan.  I am recommending return f/u to our vein clinic to be considered for laser ablation therapy.  In the meantime she will continue to wear tigh high compression 20-30 mm hg and elevation when at rest.  Activity as tolerates.     Roxy Horseman PA-C [facility] Office: [phone]  MD in clinic [place]

## 2021-08-14 ENCOUNTER — Ambulatory Visit (INDEPENDENT_AMBULATORY_CARE_PROVIDER_SITE_OTHER): Payer: Medicare Other | Admitting: Licensed Clinical Social Worker

## 2021-08-14 ENCOUNTER — Other Ambulatory Visit: Payer: Self-pay

## 2021-08-14 DIAGNOSIS — F331 Major depressive disorder, recurrent, moderate: Secondary | ICD-10-CM

## 2021-08-14 DIAGNOSIS — F411 Generalized anxiety disorder: Secondary | ICD-10-CM | POA: Diagnosis not present

## 2021-08-14 DIAGNOSIS — F4310 Post-traumatic stress disorder, unspecified: Secondary | ICD-10-CM

## 2021-08-14 NOTE — Progress Notes (Signed)
Virtual Visit via Video Note   I connected with Meghan [name] on 08/14/21 at 2:00pm by video enabled telemedicine application and verified that I am speaking with the correct person using two identifiers.   I discussed the limitations, risks, security and privacy concerns of performing an evaluation and management service by video and the availability of in person appointments. I also discussed with the patient that there may be a patient responsible charge related to this service. The patient expressed understanding and agreed to proceed.   I discussed the assessment and treatment plan with the patient. The patient was provided an opportunity to ask questions and all were answered. The patient agreed with the plan and demonstrated an understanding of the instructions.   The patient was advised to call back or seek an in-person evaluation if the symptoms worsen or if the condition fails to improve as anticipated.   I provided 45 minutes of non-face-to-face time during this encounter.     [name], LCSW, LCAS _______________________ THERAPIST PROGRESS NOTE   Session Time: 2:00pm - 2:45pm   Location: Patient: Patient home    Provider: [facility] [place] [facility]    Participation Level: Active    Behavioral Response: Alert, casually dressed, anxious mood/affect   Type of Therapy:  Individual Therapy   Treatment Goals addressed: Mood management; Medication management; Attending medical appointments    Interventions: CBT: challenging irrational thoughts    Summary: Meghan [name]. Meghan [name] is a 55 year old divorced African American female that presented for therapy appointment today with diagnoses of Major Depressive Disorder, recurrent, moderate, Generalized Anxiety Disorder and PTSD.      Suicidal/Homicidal: None; without plan or intent                                                                                                                Therapist Response:  Clinician met with Meghan [name] for virtual  therapy appointment and assessed for safety, sobriety, and medication compliance.  Meghan [name] presented for session on time and spoke in a manner that was alert, oriented x5, with no evidence or self-report of active SI/HI or A/V H.  Meghan [name] reported that she continues taking medication as prescribed and denied any use of alcohol or illicit substances.  Clinician inquired about Meghan [name] current emotional ratings, as well as any significant changes in thoughts, feelings, or behavior since previous check-in.  Meghan [name] reported scores of 0/10 for depression, 10/10 for anxiety, and 10/10 for anger/irritability.  Meghan [name] also denied experiencing any recent panic attacks.  Meghan [name] reported that a recent struggle was learning that she will need surgery again following recent pain in her leg that was assessed by the doctor to be linked to a blood clot.  Meghan [name] reported that this has led to a noticeable increase in both anxiety and irritability, which influenced an outburst when talking to her daughter this morning.  Clinician utilized handout in session today titled "Worry exploration" in order to assist Meghan [name] in reducing her anxiety related to upcoming surgery.  This worksheet featured a  series of Socratic questions aimed at exploring the most likely outcomes for a situation of concern, rather than focusing on the worst possible outcome (i.e. catastrophizing).  Clinician assisted Meghan [name] in identifying and challenging any irrational beliefs related to this worry to reduce anxiety and ensure followthrough with attending appointment.  Janalyn actively participated in discussion on handout, reporting that she is not only worried that she will be put under and not wake back up, but also potentially lose her leg due to blood clots.  Intervention was effective, as evidenced by Meghan [name] identifying sufficient evidence to suggest that neither of these stressful outcomes will come to fruition, leading to her anxiety and irritability reducing to 0/10, and  increased her confidence in ability to attend appointment for surgery.  Meghan [name] stated "It made me feel better talking about it.  That's what I need to do when I get upset".  Clinician will continue to monitor.                                      Plan: Follow up again in 1 week.    Diagnosis: Major depressive disorder, recurrent, moderate; Generalized Anxiety Disorder; and PTSD   [name], LCSW, LCAS 08/14/21

## 2021-08-16 ENCOUNTER — Encounter: Payer: Self-pay | Admitting: Vascular Surgery

## 2021-08-16 ENCOUNTER — Other Ambulatory Visit: Payer: Self-pay

## 2021-08-16 ENCOUNTER — Ambulatory Visit (INDEPENDENT_AMBULATORY_CARE_PROVIDER_SITE_OTHER): Payer: Medicare Other | Admitting: Vascular Surgery

## 2021-08-16 VITALS — BP 112/71 | HR 74 | Temp 98.0°F | Resp 20 | Ht 65.0 in | Wt 293.0 lb

## 2021-08-16 DIAGNOSIS — I872 Venous insufficiency (chronic) (peripheral): Secondary | ICD-10-CM

## 2021-08-16 NOTE — Progress Notes (Signed)
Patient ID: Meghan [name], female   DOB: 12-22-1965, 55 y.o.   MRN: [id number]  Reason for Consult: Follow-up   Referred by Beverley Fiedler, *  Subjective:     HPI:  Meghan [name] is a 55 y.o. female with pain in her left greater than right lower extremity.  She states that she has had pulmonary embolus in the past with a history of a left lower extremity DVT.  She did have an IVC filter placed many years ago approximately 2003 and the [place].  She does take Xarelto daily for atrial fibrillation as well as heart failure.  She states that she continues to have left lower extremity pain particularly with walking.  This is somewhat relieved with elevation.  She has been wearing compression for over a year this is thigh-high 20 to 30 mmHg.  Since arriving in [place] patient has lost over 50 pounds but still having difficulty with pain and discomfort in the left leg.  She has never had any vein procedures performed.  She does have skin discoloration that has recently appeared on the left ankle on the medial aspect.  She denies any previous history of ulceration.  Past Medical History:  Diagnosis Date   Arthritis    left knee   Asthma    Atrial fibrillation [facility])    Atrial fibrillation ([place]) 02/16/2019   CHF (congestive heart failure) ([place]) 01/2019   Physician from [place] informed her she had this    Depression    Dysrhythmia    Atrial fibrillation   GERD (gastroesophageal reflux disease)    Hiatal hernia    History of pulmonary embolus ([place])    Hypercholesterolemia    Macular degeneration    of left eye   Osteoarthritis    knees   Pneumonia    in the past   Pre-diabetes    Pulmonary embolism ([place])    bilat   Seizure ([place])    Seizure disorder ([place]) 2017   Sleep apnea CPAP   [facility]   Stroke ([place])    1989 and 1995 (left sided weakness)   Vitamin D deficiency    Family History  Problem Relation Age of Onset   Breast cancer Mother    Breast cancer Maternal  Grandmother    Heart attack Father    Diabetes Father    Congestive Heart Failure Father    Past Surgical History:  Procedure Laterality Date   ABDOMINAL HYSTERECTOMY  2003   complete   CARDIAC CATHETERIZATION  2017   in [facility] in [place]  [id number]   x 2   CESAREAN SECTION     x 2   CHOLECYSTECTOMY  2003   COLONOSCOPY WITH PROPOFOL N/A 06/05/2019   Procedure: COLONOSCOPY WITH PROPOFOL;  Surgeon: Carol Ada, MD;  Location: [facility];  Service: Endoscopy;  Laterality: N/A;   CYST REMOVAL WITH BONE GRAFT Left 10/18/2020   Procedure: BONE GRAFTING OF ENCHONDROMA MIDDLE [place] OF LEFT MIDDLE FINGER;  Surgeon: Daryll Brod, MD;  Location: [place];  Service: Orthopedics;  Laterality: Left;  AXILLARY BLOCK   DIAGNOSTIC LAPAROSCOPY  2015; 2017   lap hernia repair x2   HERNIA REPAIR  3546, 5681   umbilical hernia repair   IVC FILTER INSERTION  2003   Hx pulmonary embolus   POLYPECTOMY  06/05/2019   Procedure: POLYPECTOMY;  Surgeon: Carol Ada, MD;  Location: [facility];  Service: Endoscopy;;   SHOULDER ARTHROSCOPY W/ ROTATOR  CUFF REPAIR  08/28/2017   right shoulder   WRIST ARTHROSCOPY WITH DEBRIDEMENT Left 10/18/2020   Procedure: LEFT WRIST ARTHROSCOPY WITH DEBRIDEMENT;  Surgeon: Daryll Brod, MD;  Location: [place];  Service: Orthopedics;  Laterality: Left;  AXILLARY BLOCK    Short Social History:  Social History   Tobacco Use   Smoking status: Never   Smokeless tobacco: Never  Substance Use Topics   Alcohol use: Never    Allergies  Allergen Reactions   Acetaminophen-Codeine Swelling, Rash and Other (See Comments)    Tylenol with Codeine, Tylenol #3,  (facial swelling, hives)   Other Hives and Other (See Comments)    Muscle relaxer that starts with a "T" caused hives (not tizanidine) January or February 2020- made the patient VERY sick   Tramadol Hives and Other (See Comments)    Patient stated "I was trippin' and I do not want that ever  again"   Codeine Swelling    hives   Coconut Oil Rash and Other (See Comments)    ANY coconut products    Meloxicam Rash   Tomato Rash    Current Outpatient Medications  Medication Sig Dispense Refill   acetaminophen (TYLENOL) 500 MG tablet Take 1,000 mg by mouth every 6 (six) hours as needed for moderate pain.     albuterol (VENTOLIN HFA) 108 (90 Base) MCG/ACT inhaler Inhale 2 puffs into the lungs every 6 (six) hours as needed for wheezing or shortness of breath.     atorvastatin (LIPITOR) 40 MG tablet Take 40 mg by mouth daily.     BREZTRI AEROSPHERE 160-9-4.8 MCG/ACT AERO Inhale 2 puffs into the lungs in the morning and at bedtime.      celecoxib (CELEBREX) 200 MG capsule Take 200 mg by mouth daily.     Cholecalciferol (VITAMIN D-3) 25 MCG (1000 UT) CAPS Take 1,000 Units by mouth daily.     flecainide (TAMBOCOR) 50 MG tablet Take 50 mg by mouth 2 (two) times daily.      furosemide (LASIX) 40 MG tablet Take 40 mg by mouth daily with breakfast.      levETIRAcetam (KEPPRA) 500 MG tablet Take 1 tablet (500 mg total) by mouth 2 (two) times daily. 180 tablet 3   meclizine (ANTIVERT) 25 MG tablet Take 1 tablet (25 mg total) by mouth 3 (three) times daily as needed for dizziness. 30 tablet 0   metFORMIN (GLUCOPHAGE) 500 MG tablet Take 500 mg by mouth daily with breakfast.     methocarbamol (ROBAXIN) 500 MG tablet Take 1 tablet (500 mg total) by mouth 2 (two) times daily. (Patient taking differently: Take 500 mg by mouth 2 (two) times daily as needed for muscle spasms.) 20 tablet 0   metoCLOPramide (REGLAN) 5 MG tablet Take 5 mg by mouth 3 (three) times daily.     metoprolol succinate (TOPROL-XL) 25 MG 24 hr tablet Take 25 mg by mouth daily.     Multiple Vitamins-Minerals (PRESERVISION AREDS 2 PO) Take 2 capsules by mouth daily with breakfast.      Omega-3 Fatty Acids (FISH OIL) 1000 MG CAPS Take 1,000 mg by mouth daily with breakfast.      omeprazole (PRILOSEC) 40 MG capsule Take 40 mg by mouth  daily.     ondansetron (ZOFRAN) 4 MG tablet Take 4 mg by mouth every 8 (eight) hours as needed.     rivaroxaban (XARELTO) 20 MG TABS tablet Take 20 mg by mouth at bedtime.      RYBELSUS 14 MG  TABS Take 1 tablet by mouth daily.     sertraline (ZOLOFT) 50 MG tablet Take 50 mg by mouth daily.     No current facility-administered medications for this visit.    Review of Systems  Constitutional:  Constitutional negative. HENT: HENT negative.  Eyes: Eyes negative.  Cardiovascular: Positive for leg swelling.  GI: Gastrointestinal negative.  Musculoskeletal: Positive for leg pain.  Skin: Skin negative.  Neurological: Neurological negative. Hematologic: Hematologic/lymphatic negative.  Psychiatric: Psychiatric negative.       Objective:  Objective   Vitals:   08/16/21 0818  BP: 112/71  Pulse: 74  Resp: 20  Temp: 98 F (36.7 C)  SpO2: 94%  Weight: 293 lb (132.9 kg)  Height: 5\' 5"  (1.651 m)   Body mass index is 48.76 kg/m.  Physical Exam HENT:     Head: Normocephalic.     Nose:     Comments: Wearing a mask Eyes:     Pupils: Pupils are equal, round, and reactive to light.  Cardiovascular:     Rate and Rhythm: Normal rate.     Pulses: Normal pulses.  Pulmonary:     Effort: Pulmonary effort is normal.  Abdominal:     General: Abdomen is flat.     Palpations: Abdomen is soft.  Musculoskeletal:     Right lower leg: Edema present.     Left lower leg: Edema present.  Skin:    General: Skin is warm.     Capillary Refill: Capillary refill takes less than 2 seconds.     Comments: Left medial ankle as pictured below  Neurological:     General: No focal deficit present.     Mental Status: She is alert.  Psychiatric:        Mood and Affect: Mood normal.        Behavior: Behavior normal.        Thought Content: Thought content normal.     Data: +---------------+---------+------+-----------+------------+--------+  LEFT           Reflux NoRefluxReflux TimeDiameter  cmsComments                           Yes                                   +---------------+---------+------+-----------+------------+--------+  CFV                      yes   >1 second                       +---------------+---------+------+-----------+------------+--------+  FV prox                  yes   >1 second                       +---------------+---------+------+-----------+------------+--------+  FV mid                   yes   >1 second                       +---------------+---------+------+-----------+------------+--------+  FV dist                  yes   >1 second                       +---------------+---------+------+-----------+------------+--------+  Popliteal                yes   >1 second                       +---------------+---------+------+-----------+------------+--------+  GSV at [facility]               yes    >500 ms     0.725              +---------------+---------+------+-----------+------------+--------+  GSV prox thigh           yes    >500 ms     0.547              +---------------+---------+------+-----------+------------+--------+  GSV mid thigh            yes    >500 ms     0.520              +---------------+---------+------+-----------+------------+--------+  GSV dist thigh           yes    >500 ms     0.595              +---------------+---------+------+-----------+------------+--------+  GSV at knee              yes    >500 ms     0.748              +---------------+---------+------+-----------+------------+--------+  GSV prox calf                               0.675              +---------------+---------+------+-----------+------------+--------+  GSV mid calf                                0.665              +---------------+---------+------+-----------+------------+--------+  SSV Pop Fossa            yes    >500 ms     0.704               +---------------+---------+------+-----------+------------+--------+  SSV prox calf            yes    >500 ms     0.564              +---------------+---------+------+-----------+------------+--------+  SSV mid calf                                0.273              +---------------+---------+------+-----------+------------+--------+  AAGSV at SFJ             yes    >500 ms     0.978              +---------------+---------+------+-----------+------------+--------+  AAGSV prx thigh                 >500 ms     0.629              +---------------+---------+------+-----------+------------+--------+  AAGSV mid thigh          yes    >500 ms     0.650              +---------------+---------+------+-----------+------------+--------+  Summary:  Left:  - No evidence of deep vein thrombosis seen in the left lower extremity,  from the common femoral through the popliteal veins.  - No evidence of superficial venous thrombosis in the left lower  extremity.  - Venous reflux is noted in the left common femoral vein.  - Venous reflux is noted in the left sapheno-femoral junction.  - Venous reflux is noted in the left greater saphenous vein in the thigh.  - Venous reflux is noted in the left femoral vein.  - Venous reflux is noted in the left popliteal vein.  - Venous reflux is noted in the left short saphenous vein.      Assessment/Plan:     55 year old female with C4 venous disease.  The changes in her skin or recent she has a large refluxing greater saphenous vein, small saphenous vein and anterior accessory saphenous vein.  She is compliant with her compression stockings for the past 1 year that are thigh-high and moderate grade.  She has worked out at [employer] and has been walking and also elevates her leg but with no relief of pain in the left lower extremity.  She also has associated varicosities.  I have discussed with her saphenous vein ablation on the left  as well as stab phlebectomy to total 10-20 of the medial leg and medial thigh in the saphenous vein distribution.  I evaluated the vein today at bedside does appear to exit the fascia above the knee but is suitable size for ablation.  We discussed the risk and benefits of the procedure particularly DVT and the need for compression stockings before and after the procedure and the need for follow-up DVT study 2 weeks post procedure and she demonstrates good understanding of this.  I discussed the need for the procedure based on the skin changes to prevent future ulceration but that she will need continued weight loss and exercise as well as compression stockings in the future.  She demonstrates good understanding of this.  I will have [facility] reach out to her in the near future.  Xarelto will need to be held 72 hours prior to the procedure.     Waynetta Sandy MD [facility]

## 2021-08-22 ENCOUNTER — Other Ambulatory Visit: Payer: Self-pay | Admitting: *Deleted

## 2021-08-22 DIAGNOSIS — I83812 Varicose veins of left lower extremities with pain: Secondary | ICD-10-CM

## 2021-08-24 ENCOUNTER — Other Ambulatory Visit: Payer: Self-pay | Admitting: *Deleted

## 2021-08-24 MED ORDER — LORAZEPAM 1 MG PO TABS: ORAL_TABLET | ORAL | 0 refills | Status: DC

## 2021-08-28 ENCOUNTER — Ambulatory Visit (INDEPENDENT_AMBULATORY_CARE_PROVIDER_SITE_OTHER): Payer: Medicare Other | Admitting: Licensed Clinical Social Worker

## 2021-08-28 ENCOUNTER — Other Ambulatory Visit: Payer: Self-pay

## 2021-08-28 DIAGNOSIS — F331 Major depressive disorder, recurrent, moderate: Secondary | ICD-10-CM

## 2021-08-28 DIAGNOSIS — F411 Generalized anxiety disorder: Secondary | ICD-10-CM

## 2021-08-28 DIAGNOSIS — F4310 Post-traumatic stress disorder, unspecified: Secondary | ICD-10-CM

## 2021-08-28 NOTE — Progress Notes (Signed)
Virtual Visit via Video Note   I connected with Meghan [name] on 08/28/21 at 3:00pm by video enabled telemedicine application and verified that I am speaking with the correct person using two identifiers.   I discussed the limitations, risks, security and privacy concerns of performing an evaluation and management service by video and the availability of in person appointments. I also discussed with the patient that there may be a patient responsible charge related to this service. The patient expressed understanding and agreed to proceed.   I discussed the assessment and treatment plan with the patient. The patient was provided an opportunity to ask questions and all were answered. The patient agreed with the plan and demonstrated an understanding of the instructions.   The patient was advised to call back or seek an in-person evaluation if the symptoms worsen or if the condition fails to improve as anticipated.   I provided 45 minutes of non-face-to-face time during this encounter.     [name], LCSW, LCAS _______________________ THERAPIST PROGRESS NOTE   Session Time: 3:00pm - 3:45pm   Location: Patient: Patient home    Provider: [facility] [place] [facility]    Participation Level: Active    Behavioral Response: Alert, casually dressed, anxious mood/affect   Type of Therapy:  Individual Therapy   Treatment Goals addressed: Mood management; Medication management; Attending medical appointments    Interventions: CBT, physical grounding techniques     Summary: [name] is a 55 year old divorced African American female that presented for therapy appointment today with diagnoses of Major Depressive Disorder, recurrent, moderate, Generalized Anxiety Disorder and PTSD.      Suicidal/Homicidal: None; without plan or intent                                                                                                                Therapist Response:  Clinician met with [name] for virtual  therapy session and assessed for safety, sobriety, and medication compliance.  Meghan [name] presented for appointment on time and spoke in a manner that was alert, oriented x5, with no evidence or self-report of active SI/HI or A/V H.  [name] reported ongoing compliance with medication and denied any use of alcohol or illicit substances.  Clinician inquired about Meghan [name]'s emotional ratings today, as well as any significant changes in thoughts, feelings, or behavior since last check-in.  Meghan [name] reported scores of 0/10 for depression, 3/10 for anxiety, and 0/10 for anger/irritability.  Meghan [name] also denied experiencing any recent panic attacks or outbursts.  Meghan [name] reported that a recent success was going to a holiday celebration sponsored by her church, stating "I had a ball".  Meghan [name] reported that last week was busy, as she had medical appointments every day, in addition to preparing for surgery to be done on her leg sooner than anticipated due to the appearance of a blood clot.  She reported that she has mild anxiety regarding this surgery, stating "I am a little worried about what might happen".  Clinician discussed physical grounding techniques with Meghan [name]  today which she can use in order to temporarily distract from distressing thoughts and feelings that might arise during upcoming appointment, including examples such as focusing on her breathing, using a variety of grounding objects to touch such as smooth stones, a ring, or soft fabric, stretching, running cool water over her skin, and more.  Intervention was effective, as evidenced by [name] participating in discussion on this stressor, trying out several of the techniques in session, and reporting that she would practice a few ahead of upcoming appointment on Thursday in order to improve overall coping ability.  Meghan [name] stated  "I think they'll be very helpful".  Clinician will continue to monitor.                                      Plan: Follow up again in 1 week.     Diagnosis: Major depressive disorder, recurrent, moderate; Generalized Anxiety Disorder; and PTSD   [name], LCSW, LCAS 08/28/21

## 2021-08-31 ENCOUNTER — Ambulatory Visit (INDEPENDENT_AMBULATORY_CARE_PROVIDER_SITE_OTHER): Payer: Medicare Other | Admitting: Vascular Surgery

## 2021-08-31 ENCOUNTER — Other Ambulatory Visit: Payer: Self-pay

## 2021-08-31 ENCOUNTER — Encounter: Payer: Self-pay | Admitting: Vascular Surgery

## 2021-08-31 VITALS — BP 140/80 | HR 66 | Temp 97.9°F | Resp 16 | Ht 65.0 in | Wt 281.0 lb

## 2021-08-31 DIAGNOSIS — I872 Venous insufficiency (chronic) (peripheral): Secondary | ICD-10-CM

## 2021-08-31 HISTORY — PX: ENDOVENOUS ABLATION SAPHENOUS VEIN W/ LASER: SUR449

## 2021-08-31 NOTE — Progress Notes (Signed)
° ° °   Laser Ablation Procedure     Date: 08/31/2021   Meghan [name] DOB:1965-10-17  Consent signed: Yes      Surgeon: Servando Snare MD   Procedure: Laser Ablation: left Greater Saphenous Vein  BP 140/80 (BP Location: Right Arm, Patient Position: Sitting, Cuff Size: Large)    Pulse 66    Temp 97.9 F (36.6 C) (Temporal)    Resp 16    Ht 5\' 5"  (1.651 m)    Wt 281 lb (127.5 kg)    SpO2 100%    BMI 46.76 kg/m   Tumescent Anesthesia: 400 cc 0.9% NaCl with 50 cc Lidocaine HCL 1%  and 15 cc 8.4% NaHCO3  Local Anesthesia: 10 cc Lidocaine HCL and NaHCO3 (ratio 2:1)  7 watts continuous mode     Total energy: 653 Joules    Total time: 93 seconds Treatment Length  16 cm   Laser Fiber Ref. #  [id number]   Lot #  [id number]   Stab Phlebectomy: 10-20 Sites: Thigh and Calf  Patient tolerated procedure well  Notes: Patient wore face mask.  All staff members wore facial masks and facial shields/goggles.  Last dose of Xarelto taken by Meghan [name] on 08-27-2021.  Meghan [name] took Ativan 1 mg (2 tablets ) on 08-31-2021 at 9:30 AM and Ativan 1 mg (1 tablet) at 10 :30 AM.    Description of Procedure:  After marking the course of the secondary varicosities, the patient was placed on the operating table in the supine position, and the left leg was prepped and draped in sterile fashion.   Local anesthetic was administered and under ultrasound guidance the saphenous vein was accessed with a micro needle and guide wire; then the mirco puncture sheath was placed.  A guide wire was inserted saphenofemoral junction , followed by a 5 french sheath.  The position of the sheath and then the laser fiber below the junction was confirmed using the ultrasound.  Tumescent anesthesia was administered along the course of the saphenous vein using ultrasound guidance. The patient was placed in Trendelenburg position and protective laser glasses were placed on patient and staff, and the laser was fired at 7 watts continuous mode for a  total of 653 joules.   For stab phlebectomies, local anesthetic was administered at the previously marked varicosities, and tumescent anesthesia was administered around the vessels.  Ten to 20 stab wounds were made using the tip of an 11 blade. And using the vein hook, the phlebectomies were performed using a hemostat to avulse the varicosities.  Adequate hemostasis was achieved.     Steri strips were applied to the stab wounds and ABD pads and thigh high compression stockings were applied.  Ace wrap bandages were applied over the phlebectomy sites and at the top of the saphenofemoral junction. Blood loss was less than 15 cc.  Discharge instructions reviewed with patient and hardcopy of discharge instructions given to patient to take home. The patient ambulated out of the operating room having tolerated the procedure well.

## 2021-08-31 NOTE — Progress Notes (Signed)
Patient name: Meghan [name] MRN: [id number] DOB: August 16, 1966 Sex: female  REASON FOR VISIT: Chronic venous insufficiency here for laser ablation and stab phlebectomy  HPI: Meghan [name] is a 55 y.o. female with C4 venous disease with recent skin changes of the left medial ankle.  She has been evaluated and has large greater saphenous vein that is refluxing on the left.  She has been compliant with compression stockings.  She presents today for treatment.  She has taken Ativan prior to arrival but is having some nerves at this time.  Current Outpatient Medications  Medication Sig Dispense Refill   acetaminophen (TYLENOL) 500 MG tablet Take 1,000 mg by mouth every 6 (six) hours as needed for moderate pain.     albuterol (VENTOLIN HFA) 108 (90 Base) MCG/ACT inhaler Inhale 2 puffs into the lungs every 6 (six) hours as needed for wheezing or shortness of breath.     atorvastatin (LIPITOR) 40 MG tablet Take 40 mg by mouth daily.     BREZTRI AEROSPHERE 160-9-4.8 MCG/ACT AERO Inhale 2 puffs into the lungs in the morning and at bedtime.      celecoxib (CELEBREX) 200 MG capsule Take 200 mg by mouth daily.     Cholecalciferol (VITAMIN D-3) 25 MCG (1000 UT) CAPS Take 1,000 Units by mouth daily.     flecainide (TAMBOCOR) 50 MG tablet Take 50 mg by mouth 2 (two) times daily.      furosemide (LASIX) 40 MG tablet Take 40 mg by mouth daily with breakfast.      levETIRAcetam (KEPPRA) 500 MG tablet Take 1 tablet (500 mg total) by mouth 2 (two) times daily. 180 tablet 3   LORazepam (ATIVAN) 1 MG tablet Take 2 tablets prior to leaving house on day of office surgery.  Bring third tablet with you to the office on the day of office surgery. 2 tablet 0   metFORMIN (GLUCOPHAGE) 500 MG tablet Take 500 mg by mouth daily with breakfast.     methocarbamol (ROBAXIN) 500 MG tablet Take 1 tablet (500 mg total) by mouth 2 (two) times daily. (Patient taking differently: Take 500 mg by mouth 2 (two) times daily as needed for  muscle spasms.) 20 tablet 0   metoCLOPramide (REGLAN) 5 MG tablet Take 5 mg by mouth 3 (three) times daily.     metoprolol succinate (TOPROL-XL) 25 MG 24 hr tablet Take 25 mg by mouth daily.     Multiple Vitamins-Minerals (PRESERVISION AREDS 2 PO) Take 2 capsules by mouth daily with breakfast.      Omega-3 Fatty Acids (FISH OIL) 1000 MG CAPS Take 1,000 mg by mouth daily with breakfast.      omeprazole (PRILOSEC) 40 MG capsule Take 40 mg by mouth daily.     rivaroxaban (XARELTO) 20 MG TABS tablet Take 20 mg by mouth at bedtime.      RYBELSUS 14 MG TABS Take 1 tablet by mouth daily.     sertraline (ZOLOFT) 50 MG tablet Take 50 mg by mouth daily.     meclizine (ANTIVERT) 25 MG tablet Take 1 tablet (25 mg total) by mouth 3 (three) times daily as needed for dizziness. 30 tablet 0   ondansetron (ZOFRAN) 4 MG tablet Take 4 mg by mouth every 8 (eight) hours as needed.     No current facility-administered medications for this visit.    PHYSICAL EXAM: Vitals:   08/31/21 1053  BP: 140/80  Pulse: 66  Resp: 16  Temp: 97.9 F (  36.6 C)  TempSrc: Temporal  SpO2: 100%  Weight: 281 lb (127.5 kg)  Height: _0  (1.651 m)   Awake alert oriented Nonlabored respirations Left leg appears as previous image, varicosities were marked on the skin with the patient in the standing position Greater saphenous vein mapped on the left upper upper leg.  There is an accessory saphenous vein which exit the fascia above the level of the varicosities and the greater saphenous vein that exits the fascia at the level of the varicosities which appears to be the larger vein.   PROCEDURE: Left greater saphenous vein ablation with Left lower extremity stab phlebectomy totaling 10-20 stabs   TECHNIQUE: Patient was brought to the exam room and he was initially placed standing and his varicose veins were marked on the left lower extremity in the medial thigh and the medial calf.  She was then placed supine on the table and the  greater saphenous and accessory saphenous veins were mapped throughout the upper leg.  The saphenofemoral junction was easily identified.  The accessory and saphenous to come together but this is approximately 1 cm from the junction.  She was then sterilely prepped and draped in the left lower extremity with a flat prep.  Timeout was called.  Ultrasound was used to identify the greater saphenous vein where we had marked it before.  The area was anesthetized with 1% lidocaine and cannulated with micropuncture needle under direct ultrasound visualization.  A wire passed easily and was followed with ultrasound.  A micropuncture sheath was placed.  45 cm kit was opened.  The J-wire was placed to the level of the most proximal greater saphenous vein where it came together with the anterior sensory vein.  This was only approximately 1 cm from the junction.  It was backed up to 2-1/2 cm.  The catheter was then placed at this level which was a total of 19 cm.  The laser was then reduced and married to the catheter catheter is now 16 cm from the skin level and to 2.5 cm from the junction this was marked.  Tumescent anesthesia was induced along the level of the laser.  Protective eyeglasses were placed on all members present.  Laser ablation was then performed for total of 16 cm at 50 J/cm and 7 W.  Catheter was totally removed and eyeglasses were removed as well.  Ultrasound was used identify that the common femoral vein was patent and the saphenous vein appeared sclerotic just up to the level of the junction with the anterior excessively saphenous vein.  We turned our attention to the medial thigh and the medial leg.  Previously marked varicose veins were anesthetized with 1% lidocaine and tumescent anesthesia was instilled throughout.  About 15 stab phlebectomy incisions were made in the vertical direction.  Crochet hook and hemostat were used to grasp the veins and remove them entirely.  Steri-Strips were then placed over  the incision and a pressure dressing was applied.  She tolerated the procedure well without immediate complication.  She will follow-up next week with post laser ablation duplex to rule out DVT.   If she were to continue to have issues we could consider ablation of the anterior sensory vein in the future if this is not closed off by today's laser ablation.      Servando Snare [facility]

## 2021-09-01 ENCOUNTER — Ambulatory Visit: Payer: Medicare Other

## 2021-09-04 ENCOUNTER — Ambulatory Visit (HOSPITAL_COMMUNITY): Payer: Medicare Other | Admitting: Licensed Clinical Social Worker

## 2021-09-06 ENCOUNTER — Other Ambulatory Visit: Payer: Self-pay

## 2021-09-06 ENCOUNTER — Ambulatory Visit (INDEPENDENT_AMBULATORY_CARE_PROVIDER_SITE_OTHER): Payer: Medicare Other | Admitting: Vascular Surgery

## 2021-09-06 ENCOUNTER — Encounter: Payer: Self-pay | Admitting: Vascular Surgery

## 2021-09-06 ENCOUNTER — Ambulatory Visit (HOSPITAL_COMMUNITY)
Admission: RE | Admit: 2021-09-06 | Discharge: 2021-09-06 | Disposition: A | Discharge status: Home / Self Care | Payer: Medicare Other | Source: Ambulatory Visit | Attending: Vascular Surgery | Admitting: Vascular Surgery

## 2021-09-06 VITALS — BP 110/58 | HR 73 | Temp 98.3°F | Resp 20 | Ht 65.0 in | Wt 281.0 lb

## 2021-09-06 DIAGNOSIS — I83812 Varicose veins of left lower extremities with pain: Secondary | ICD-10-CM | POA: Insufficient documentation

## 2021-09-06 DIAGNOSIS — I872 Venous insufficiency (chronic) (peripheral): Secondary | ICD-10-CM

## 2021-09-06 NOTE — Progress Notes (Signed)
Subjective:     Patient ID: Meghan [name], female   DOB: [birth year], 55 y.o.   MRN: [id number]  HPI 55 year old female follows up after left greater saphenous vein ablation and stab phlebectomies totaling approximately 15.  She has done very well.  She does have some bruising.  She is back on her Xarelto at this time.  No left lower extremity swelling.   Review of Systems No complaints today    Objective:   Physical Exam Vitals:   09/06/21 1105  BP: (!) 110/58  Pulse: 73  Resp: 20  Temp: 98.3 F (36.8 C)  SpO2: 96%      LEFT               Reflux No Reflux Reflux Time Diameter cms Comments                                            Yes                                                 +------------------+---------+------+-----------+------------+-------------  ----+   CFV                                                          Patent,                                                                             spontaneous,  and                                                                  phasic               +------------------+---------+------+-----------+------------+-------------  ----+   FV prox                                                      Patent,                                                                             spontaneous,  and  phasic               +------------------+---------+------+-----------+------------+-------------  ----+   FV mid                                                       Patent,                                                                             spontaneous,  and                                                                  phasic               +------------------+---------+------+-----------+------------+-------------  ----+   Popliteal                                                    Patent,                                                                              spontaneous,  and                                                                  phasic               +------------------+---------+------+-----------+------------+-------------  ----+   GSV at [facility]                                                   Patent               +------------------+---------+------+-----------+------------+-------------  ----+   GSV prox thigh                                               Patent to  slightly  distal                                                                   to the AASV                                                                         confluence.           +------------------+---------+------+-----------+------------+-------------  ----+   superior                                                     Patent,                epigastric vein                                              spontaneous           +------------------+---------+------+-----------+------------+-------------  ----+           Summary:  Left:  - No evidence of deep vein thrombosis seen in the left lower extremity,  from the common femoral through the popliteal veins.  - Patent GSV in the proximal thigh to slightly distal to the AASV  confluence. Successful GSV ablation distally.     Assessment:     55 year old female status post left greater saphenous vein ablation and stab phlebectomies recovering well.    Plan:     She does have an anterior excessively vein I was unable to ablate up to this level as the confluence was approximately 0.5 cm from the common femoral vein.  There is no evidence of DVT today but there is a large anterior sensory vein.  The GSV has been successfully ablated which appeared to be the vein leading to her varicosities.  She does have skin changes on the medial ankle she will continue her compression stockings and follow-up  with [name] as needed.    Laneice Meneely C. Donzetta Matters, MD [facility] Office: [phone] Pager: [phone]

## 2021-09-11 ENCOUNTER — Other Ambulatory Visit: Payer: Self-pay

## 2021-09-11 ENCOUNTER — Emergency Department (HOSPITAL_COMMUNITY): Payer: Medicare Other

## 2021-09-11 ENCOUNTER — Emergency Department (HOSPITAL_COMMUNITY)
Admission: EM | Admit: 2021-09-11 | Discharge: 2021-09-11 | Disposition: A | Discharge status: Home / Self Care | Payer: Medicare Other | Attending: Emergency Medicine | Admitting: Emergency Medicine

## 2021-09-11 ENCOUNTER — Encounter (HOSPITAL_COMMUNITY): Payer: Self-pay | Admitting: Emergency Medicine

## 2021-09-11 DIAGNOSIS — Z79899 Other long term (current) drug therapy: Secondary | ICD-10-CM | POA: Diagnosis not present

## 2021-09-11 DIAGNOSIS — I509 Heart failure, unspecified: Secondary | ICD-10-CM | POA: Diagnosis not present

## 2021-09-11 DIAGNOSIS — R0789 Other chest pain: Secondary | ICD-10-CM | POA: Diagnosis not present

## 2021-09-11 DIAGNOSIS — J45909 Unspecified asthma, uncomplicated: Secondary | ICD-10-CM | POA: Diagnosis not present

## 2021-09-11 DIAGNOSIS — Z7984 Long term (current) use of oral hypoglycemic drugs: Secondary | ICD-10-CM | POA: Diagnosis not present

## 2021-09-11 DIAGNOSIS — R7303 Prediabetes: Secondary | ICD-10-CM | POA: Insufficient documentation

## 2021-09-11 DIAGNOSIS — I4891 Unspecified atrial fibrillation: Secondary | ICD-10-CM | POA: Diagnosis not present

## 2021-09-11 DIAGNOSIS — R519 Headache, unspecified: Secondary | ICD-10-CM | POA: Diagnosis present

## 2021-09-11 DIAGNOSIS — R011 Cardiac murmur, unspecified: Secondary | ICD-10-CM | POA: Diagnosis not present

## 2021-09-11 LAB — TROPONIN I (HIGH SENSITIVITY)
Troponin I (High Sensitivity): 5 ng/L (ref <18)
Troponin I (High Sensitivity): 4 ng/L (ref <18)

## 2021-09-11 LAB — BASIC METABOLIC PANEL
Anion gap: 8 (ref 5–15)
BUN: 18 mg/dL (ref 6–20)
CO2: 26 mmol/L (ref 22–32)
Calcium: 9.9 mg/dL (ref 8.9–10.3)
Chloride: 105 mmol/L (ref 98–111)
Creatinine, Ser: 0.84 mg/dL (ref 0.44–1.00)
GFR, Estimated: >60 mL/min (ref >60)
Glucose, Bld: 92 mg/dL (ref 70–99)
Potassium: 4 mmol/L (ref 3.5–5.1)
Sodium: 139 mmol/L (ref 135–145)

## 2021-09-11 LAB — CBC WITH DIFFERENTIAL/PLATELET
Abs Immature Granulocytes: 0.01 10*3/uL (ref 0.00–0.07)
Basophils Absolute: 0 10*3/uL (ref 0.0–0.1)
Basophils Relative: 0 %
Eosinophils Absolute: 0.1 10*3/uL (ref 0.0–0.5)
Eosinophils Relative: 2 %
HCT: 31.1 % — ABNORMAL LOW (ref 36.0–46.0)
Hemoglobin: 10.1 g/dL — ABNORMAL LOW (ref 12.0–15.0)
Immature Granulocytes: 0 %
Lymphocytes Relative: 28 %
Lymphs Abs: 1.7 10*3/uL (ref 0.7–4.0)
MCH: 27.8 pg (ref 26.0–34.0)
MCHC: 32.5 g/dL (ref 30.0–36.0)
MCV: 85.7 fL (ref 80.0–100.0)
Monocytes Absolute: 0.5 10*3/uL (ref 0.1–1.0)
Monocytes Relative: 8 %
Neutro Abs: 3.9 10*3/uL (ref 1.7–7.7)
Neutrophils Relative %: 62 %
Platelets: 265 10*3/uL (ref 150–400)
RBC: 3.63 MIL/uL — ABNORMAL LOW (ref 3.87–5.11)
RDW: 14.6 % (ref 11.5–15.5)
WBC: 6.3 10*3/uL (ref 4.0–10.5)
nRBC: 0 % (ref 0.0–0.2)

## 2021-09-11 MED ORDER — IOHEXOL 350 MG/ML SOLN
75.0000 mL | Freq: Once | INTRAVENOUS | Status: AC | PRN
  Administered 2021-09-11: 17:00:00 75 mL via INTRAVENOUS

## 2021-09-11 MED ORDER — TETRACAINE HCL 0.5 % OP SOLN
1.0000 [drp] | Freq: Once | OPHTHALMIC | Status: AC
  Administered 2021-09-11: 14:00:00 1 [drp] via OPHTHALMIC
  Filled 2021-09-11: qty 4

## 2021-09-11 MED ORDER — PROCHLORPERAZINE EDISYLATE 10 MG/2ML IJ SOLN
10.0000 mg | Freq: Once | INTRAMUSCULAR | Status: AC
  Administered 2021-09-11: 13:00:00 10 mg via INTRAVENOUS
  Filled 2021-09-11: qty 2

## 2021-09-11 MED ORDER — DIPHENHYDRAMINE HCL 50 MG/ML IJ SOLN
12.5000 mg | Freq: Once | INTRAMUSCULAR | Status: AC
  Administered 2021-09-11: 13:00:00 12.5 mg via INTRAVENOUS
  Filled 2021-09-11: qty 1

## 2021-09-11 NOTE — ED Triage Notes (Signed)
Pt to ER with c/o headache and blurry vision for approximately 1 week.  States at 3AM headache got worse and has been more intense since 3AM.  Pt also reports mid CP that started this AM as well.  Denies SHOB, [place].

## 2021-09-11 NOTE — ED Provider Notes (Signed)
Physical Exam  BP 118/72    Pulse (!) 59    Temp 98.1 F (36.7 C)    Resp 20    Ht 5\' 6"  (1.676 m)    Wt 127.5 kg    SpO2 100%    BMI 45.35 kg/m   Physical Exam Vitals and nursing note reviewed.  Constitutional:      General: She is not in acute distress.    Appearance: Normal appearance.  HENT:     Head: Normocephalic and atraumatic.  Eyes:     General:        Right eye: No discharge.        Left eye: No discharge.     Extraocular Movements: Extraocular movements intact.     Right eye: No nystagmus.  Cardiovascular:     Heart sounds: Murmur heard.  Systolic murmur is present with a grade of 3/6.     Comments: Regular rate and rhythm. Radial pulses are 2+ bilaterally.  Dorsalis pedis pulses are 2+ bilaterally.  No evidence of pedal edema. Pulmonary:     Comments: Clear to auscultation bilaterally.  Normal effort.  No respiratory distress.  No evidence of wheezes, rales, or rhonchi heard throughout. Abdominal:     General: Abdomen is flat. Bowel sounds are normal. There is no distension.     Tenderness: There is no abdominal tenderness. There is no guarding or rebound.  Musculoskeletal:        General: Normal range of motion.     Cervical back: Neck supple.  Skin:    General: Skin is warm and dry.     Findings: No rash.  Neurological:     General: No focal deficit present.     Mental Status: She is alert.     GCS: GCS eye subscore is 4. GCS verbal subscore is 5. GCS motor subscore is 6.     Comments: Cranial nerves II through XII are intact.  Normal and symmetrical smile without any evidence of facial droop.  5/5 strength to the upper and lower extremities.  Normal sensation to the upper and lower extremities.  No dysmetria on finger-nose.  Normal heel-to-shin.  Extraocular movements were intact without any signs of nystagmus.  Psychiatric:        Mood and Affect: Mood normal.        Behavior: Behavior normal.    ED Course/Procedures     Procedures  MDM  Accepted  handoff at shift change from Smoot PA-C. Please see prior provider note for more detail.   Briefly: Patient is 55 y.o. female who presents to the emergency department with headaches and blurred vision that has been ongoing for the last week.  They have been initially intermittent but today did not go away with prompted arrival to emergency department today.  Also was having some chest pain.  Patient does have a history of atrial fibrillation.  DDX: concern for stroke versus aneurysm versus migraine.  Plan: Work-up today is reassuring.  CBC showed mild anemia which seems to be at her baseline.  No other abnormalities.  BMP was normal.  Initial and delta troponin were negative.  Have a low suspicion for ACS at this time.  EKG did not show any signs of ST elevation or other signs of ischemia.  Chest x-ray was negative.  CTA angio head and neck was also negative.  I have a low suspicion at this time for stroke or aneurysm or space-occupying lesion.  I have a low suspicion for  infectious etiology.  This is likely a migraine.  Her headache did improve somewhat with migraine cocktail.  I did consider however I do believe giant cell arteritis is less likely given that the blurred vision is bilateral in nature.  She does not have any other coinciding risk factors including polymyalgia rheumatica.  I believe she is safe for discharge.  Strict return precautions given.  I will have her follow-up with her primary care provider.              Myna Bright [place], PA-C 09/14/21 [id number]    Noemi Chapel, MD 09/16/21 [phone]

## 2021-09-11 NOTE — ED Provider Notes (Signed)
[facility] EMERGENCY DEPARTMENT Provider Note   CSN: [id number] Arrival date & time: 09/11/21  1110     History Chief Complaint  Patient presents with   Headache   Blurred Vision   Chest Pain    Meghan [name] is a 55 y.o. female.  Patient with history of afib, CHF, and CVA presents today with headache and chest pain.  She states that her headache first presented on 12/20 when she was washing dishes.  She described it as sudden onset sharp stabbing pain in her head.  She states that she also had some associated blurred vision worse in the left eye and that it lasted less than an hour.  Pain then resolved, however she states she has had intermittent episodes similar to this since then all short lasting in nature with different focal points of headache with associated blurred vision.  She originally thought that maybe it was due to to her glasses prescription being too strong, however she went back to her old glasses and her symptoms persisted.  She states that at 3 AM she woke up with some chest pressure on the left side that radiated down her left arm and felt that she was in A. fib.  She then took her flecainide but that her doctor told her to take when she feels that she is in A. fib, had resolution of irregular heartbeat, with improvement in her chest pain.  Soon after this she developed return of the headache and it has not subsided like with past episodes.  She states the pain is in her left temple region and that she has blurred vision in both eyes, however the left is worse than the right.  She describes her vision as "feeling like something is in my eye but there is nothing there."  She is anticoagulated with Xarelto.  Of note, patient had venous laser ablation and stab phlebectomy for chronic venous insufficiency in her left leg on 12/15.  She states that she stopped taking her Xarelto on 12/12 to prepare for this procedure and restarted her Xarelto on 12/17 and has not  missed any doses.  She denies smoking, alcohol use, recreational drug use, or IVDU.  She denies fevers, chills, shortness of breath, abdominal pain, nausea, vomiting, diarrhea, leg swelling, leg pain. Of note, patient endorses some residual left sided weakness from previous stroke.  The history is provided by the patient. No language interpreter was used.  Headache Associated symptoms: no abdominal pain, no cough, no diarrhea, no dizziness, no eye pain, no fever, no nausea, no neck pain, no neck stiffness, no numbness, no photophobia, no seizures, no vomiting and no weakness   Chest Pain Associated symptoms: headache   Associated symptoms: no abdominal pain, no cough, no dizziness, no fever, no nausea, no numbness, no palpitations, no shortness of breath, no vomiting and no weakness       Past Medical History:  Diagnosis Date   Arthritis    left knee   Asthma    Atrial fibrillation (HCC)    Atrial fibrillation ([place]) 02/16/2019   CHF (congestive heart failure) ([place]) 01/2019   Physician from [place] informed her she had this    Depression    Dysrhythmia    Atrial fibrillation   GERD (gastroesophageal reflux disease)    Hiatal hernia    History of pulmonary embolus (PE)    Hypercholesterolemia    Macular degeneration    of left eye   Osteoarthritis  knees   Pneumonia    in the past   Pre-diabetes    Pulmonary embolism (HCC)    bilat   Seizure ([place])    Seizure disorder ([place]) 2017   Sleep apnea CPAP   [facility]   Stroke [facility])    1989 and 1995 (left sided weakness)   Vitamin D deficiency     Patient Active Problem List   Diagnosis Date Noted   Intractable nausea and vomiting 11/16/2020   Ileus ([place]) 11/14/2020   Fecal impaction ([place]) 11/14/2020   Left-sided weakness 09/29/2020   Asthma 09/29/2020   Pre-diabetes 09/29/2020   Pain in left finger(s) 07/12/2020   Seizure-like activity ([place]) 03/25/2020   History of head injury 03/25/2020   Obstructive sleep apnea syndrome  09/04/2019   Acute medial meniscus tear of right knee 07/10/2019   Class 3 severe obesity due to excess calories with serious comorbidity and body mass index (BMI) of 50.0 to 59.9 in adult ([place]) 06/01/2019   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   Atrial fibrillation ([place]) 04/17/2019   History of pulmonary embolus (PE) 04/17/2019    Past Surgical History:  Procedure Laterality Date   ABDOMINAL HYSTERECTOMY  2003   complete   CARDIAC CATHETERIZATION  2017   in [facility] in [place]  [phone]   x 2   CESAREAN SECTION     x 2   CHOLECYSTECTOMY  2003   COLONOSCOPY WITH PROPOFOL N/A 06/05/2019   Procedure: COLONOSCOPY WITH PROPOFOL;  Surgeon: Carol Ada, MD;  Location: [facility];  Service: Endoscopy;  Laterality: N/A;   CYST REMOVAL WITH BONE GRAFT Left 10/18/2020   Procedure: BONE GRAFTING OF ENCHONDROMA MIDDLE [place] OF LEFT MIDDLE FINGER;  Surgeon: Daryll Brod, MD;  Location: [facility];  Service: Orthopedics;  Laterality: Left;  AXILLARY BLOCK   DIAGNOSTIC LAPAROSCOPY  2015; 2017   lap hernia repair x2   ENDOVENOUS ABLATION SAPHENOUS VEIN W/ LASER Left 08/31/2021   endovenous laser ablation left greater saphenous vein and stab phlebectomy 10-20 incisions left leg by Servando Snare MD   HERNIA REPAIR  0093, 8182   umbilical hernia repair   IVC FILTER INSERTION  2003   Hx pulmonary embolus   POLYPECTOMY  06/05/2019   Procedure: POLYPECTOMY;  Surgeon: Carol Ada, MD;  Location: [facility];  Service: Endoscopy;;   SHOULDER ARTHROSCOPY W/ ROTATOR CUFF REPAIR  08/28/2017   right shoulder   WRIST ARTHROSCOPY WITH DEBRIDEMENT Left 10/18/2020   Procedure: LEFT WRIST ARTHROSCOPY WITH DEBRIDEMENT;  Surgeon: Daryll Brod, MD;  Location: [facility];  Service: Orthopedics;  Laterality: Left;  AXILLARY BLOCK     OB History   No obstetric history on file.     Family History  Problem Relation Age of Onset   Breast cancer Mother    Breast cancer Maternal  Grandmother    Heart attack Father    Diabetes Father    Congestive Heart Failure Father     Social History   Tobacco Use   Smoking status: Never   Smokeless tobacco: Never  Vaping Use   Vaping Use: Never used  Substance Use Topics   Alcohol use: Never   Drug use: Never    Home Medications Prior to Admission medications   Medication Sig Start Date End Date Taking? Authorizing Provider  acetaminophen (TYLENOL) 500 MG tablet Take 1,000 mg by mouth every 6 (six) hours as needed for moderate pain.    [provider]  albuterol (VENTOLIN HFA) 108 (90  Base) MCG/ACT inhaler Inhale 2 puffs into the lungs every 6 (six) hours as needed for wheezing or shortness of breath.    [provider]  atorvastatin (LIPITOR) 40 MG tablet Take 40 mg by mouth daily. 07/26/21   [provider]  BREZTRI AEROSPHERE 160-9-4.8 MCG/ACT AERO Inhale 2 puffs into the lungs in the morning and at bedtime.  03/24/20   [provider]  celecoxib (CELEBREX) 200 MG capsule Take 200 mg by mouth daily. 01/02/21   [provider]  Cholecalciferol (VITAMIN D-3) 25 MCG (1000 UT) CAPS Take 1,000 Units by mouth daily.    [provider]  flecainide (TAMBOCOR) 50 MG tablet Take 50 mg by mouth 2 (two) times daily.  09/04/19   [provider]  furosemide (LASIX) 40 MG tablet Take 40 mg by mouth daily with breakfast.     [provider]  levETIRAcetam (KEPPRA) 500 MG tablet Take 1 tablet (500 mg total) by mouth 2 (two) times daily. 07/04/21   Marcial Pacas, MD  LORazepam (ATIVAN) 1 MG tablet Take 2 tablets prior to leaving house on day of office surgery.  Bring third tablet with you to the office on the day of office surgery. 08/24/21   Waynetta Sandy, MD  meclizine (ANTIVERT) 25 MG tablet Take 1 tablet (25 mg total) by mouth 3 (three) times daily as needed for dizziness. Patient not taking: Reported on 09/06/2021 01/11/21   Domenic Moras, PA-C  metFORMIN  (GLUCOPHAGE) 500 MG tablet Take 500 mg by mouth daily with breakfast.    [provider]  methocarbamol (ROBAXIN) 500 MG tablet Take 1 tablet (500 mg total) by mouth 2 (two) times daily. Patient taking differently: Take 500 mg by mouth 2 (two) times daily as needed for muscle spasms. 12/14/20   Tedd Sias, PA  metoCLOPramide (REGLAN) 5 MG tablet Take 5 mg by mouth 3 (three) times daily. 01/02/21   [provider]  metoprolol succinate (TOPROL-XL) 25 MG 24 hr tablet Take 25 mg by mouth daily. 05/02/21   [provider]  Multiple Vitamins-Minerals (PRESERVISION AREDS 2 PO) Take 2 capsules by mouth daily with breakfast.     [provider]  Omega-3 Fatty Acids (FISH OIL) 1000 MG CAPS Take 1,000 mg by mouth daily with breakfast.     [provider]  omeprazole (PRILOSEC) 40 MG capsule Take 40 mg by mouth daily. 08/17/20   [provider]  ondansetron (ZOFRAN) 4 MG tablet Take 4 mg by mouth every 8 (eight) hours as needed. Patient not taking: Reported on 09/06/2021 05/23/21   [provider]  rivaroxaban (XARELTO) 20 MG TABS tablet Take 20 mg by mouth at bedtime.     [provider]  RYBELSUS 14 MG TABS Take 1 tablet by mouth daily. 07/27/21   [provider]  sertraline (ZOLOFT) 50 MG tablet Take 50 mg by mouth daily. 01/01/21   [provider]    Allergies    Acetaminophen-codeine, Other, Tramadol, Codeine, Coconut oil, Meloxicam, and Tomato  Review of Systems   Review of Systems  Constitutional:  Negative for chills and fever.  Eyes:  Positive for visual disturbance. Negative for photophobia, pain, discharge, redness and itching.  Respiratory:  Negative for cough, shortness of breath, wheezing and stridor.   Cardiovascular:  Positive for chest pain. Negative for palpitations and leg swelling.  Gastrointestinal:  Negative for abdominal pain, diarrhea, nausea and vomiting.  Musculoskeletal:  Negative for  neck pain and neck stiffness.  Skin:  Negative for rash and wound.  Neurological:  Positive for headaches. Negative for dizziness, tremors, seizures, syncope, facial asymmetry, speech difficulty, weakness, light-headedness and numbness.  Hematological:  Bruises/bleeds easily.  Psychiatric/Behavioral:  Negative for confusion and decreased concentration.   All other systems reviewed and are negative.  Physical Exam Updated Vital Signs BP 122/73    Pulse 67    Temp 98.1 F (36.7 C)    Resp 18    Ht 5\' 6"  (1.676 m)    Wt 127.5 kg    SpO2 100%    BMI 45.35 kg/m   Physical Exam Vitals and nursing note reviewed.  Constitutional:      General: She is not in acute distress.    Appearance: Normal appearance. She is well-developed. She is obese. She is not ill-appearing, toxic-appearing or diaphoretic.     Comments: Patient resting comfortably in bed talking on the phone in no acute distress  HENT:     Head: Normocephalic and atraumatic.     Comments: No tenderness noted over the temporal artery on the left side    Mouth/Throat:     Mouth: Mucous membranes are moist.  Eyes:     General: No visual field deficit or scleral icterus.    Intraocular pressure: Right eye pressure is 16 mmHg. Left eye pressure is 16 mmHg. Measurements were taken using an automated tonometer.    Extraocular Movements: Extraocular movements intact.     Right eye: Normal extraocular motion and no nystagmus.     Left eye: Normal extraocular motion and no nystagmus.     Pupils: Pupils are equal, round, and reactive to light. Pupils are equal.     Right eye: Pupil is round and reactive.     Left eye: Pupil is round and reactive.     Visual Fields: Right eye visual fields normal and left eye visual fields normal.  Neck:     Meningeal: Brudzinski's sign and Kernig's sign absent.  Cardiovascular:     Rate and Rhythm: Normal rate and regular rhythm.     Pulses:          Radial pulses are 2+ on the right side and 2+ on the  left side.       Dorsalis pedis pulses are 2+ on the right side and 2+ on the left side.     Heart sounds: Murmur heard.  Systolic murmur is present with a grade of 3/6.  Pulmonary:     Effort: Pulmonary effort is normal. No respiratory distress.     Breath sounds: Normal breath sounds. No wheezing.  Abdominal:     General: Bowel sounds are normal.     Palpations: Abdomen is soft.  Musculoskeletal:        General: No swelling or tenderness. Normal range of motion.     Cervical back: Normal range of motion and neck supple. No rigidity.     Right lower leg: No tenderness. No edema.     Left lower leg: No tenderness. No edema.  Lymphadenopathy:     Cervical: No cervical adenopathy.  Skin:    General: Skin is warm and dry.  Neurological:     General: No focal deficit present.     Mental Status: She is alert and oriented to person, place, and time.     GCS: GCS eye subscore is 4. GCS verbal subscore is 5. GCS motor subscore is 6.     Cranial Nerves: No cranial nerve deficit, dysarthria or facial  asymmetry.     Sensory: Sensation is intact.     Motor: Motor function is intact.     Coordination: Coordination is intact.     Gait: Gait is intact.     Comments: Alert and oriented to self, place, time and event.    Speech is fluent, clear without dysarthria or dysphasia.    Strength 5/5 in upper/lower extremities   Sensation intact in upper/lower extremities    CN I not tested  CN II grossly intact visual fields bilaterally. Did not visualize posterior eye.  CN III, IV, VI PERRLA and EOMs intact bilaterally  CN V Intact sensation to sharp and light touch to the face  CN VII facial movements symmetric  CN VIII not tested  CN IX, X no uvula deviation, symmetric rise of soft palate  CN XI 5/5 SCM and trapezius strength bilaterally  CN XII Midline tongue protrusion, symmetric L/R movements   Psychiatric:        Mood and Affect: Mood normal.        Behavior: Behavior normal.    ED  Results / Procedures / Treatments   Labs (all labs ordered are listed, but only abnormal results are displayed) Labs Reviewed  CBC WITH DIFFERENTIAL/PLATELET - Abnormal; Notable for the following components:      Result Value   RBC 3.63 (*)    Hemoglobin 10.1 (*)    HCT 31.1 (*)    All other components within normal limits  BASIC METABOLIC PANEL  TROPONIN I (HIGH SENSITIVITY)  TROPONIN I (HIGH SENSITIVITY)    EKG None  Radiology DG Chest 2 View  Result Date: 09/11/2021 CLINICAL DATA:  Chest pain. EXAM: CHEST - 2 VIEW COMPARISON:  07/19/2021 FINDINGS: 1309 hours. The lungs are clear without focal pneumonia, edema, pneumothorax or pleural effusion. The cardiopericardial silhouette is within normal limits for size. The visualized bony structures of the thorax show no acute abnormality. Telemetry leads overlie the chest. IMPRESSION: No active cardiopulmonary disease. Electronically Signed   By: Misty Stanley M.D.   On: 09/11/2021 13:01    Procedures Procedures   Medications Ordered in ED Medications  prochlorperazine (COMPAZINE) injection 10 mg (10 mg Intravenous Given 09/11/21 1329)  diphenhydrAMINE (BENADRYL) injection 12.5 mg (12.5 mg Intravenous Given 09/11/21 1329)  tetracaine (PONTOCAINE) 0.5 % ophthalmic solution 1 drop (1 drop Both Eyes Given 09/11/21 1330)    ED Course  I have reviewed the triage vital signs and the nursing notes.  Pertinent labs & imaging results that were available during my care of the patient were reviewed by me and considered in my medical decision making (see chart for details).    MDM Rules/Calculators/A&P                         Patient presents today with intermittent headache for the past week. Acute onset chest pain followed by headache at 3 am this morning that has not subsided. She is neurologically intact and alert and oriented.   Troponin negative. CBC without leukocytosis, anemia per baseline. BMP in process at shift change. Given  headache cocktail with some relief. Chest pain also completely resolved. CXR without active cardiopulmonary disease. EKG sinus rhythm without signs of ST elevation or ischemia. Low suspicion for ACS at this time. Patient states that her pain is over the temporal region of her head, however no tenderness palpated over the temporal artery. Low suspicion of temporal arteritis at this time. Plan to get CTA head  and neck with and without contrast for further evaluation and to rule out dissection, aneurysm, or other acute abnormalities. This is pending at shift change. If it is normal, feel patient will likely be able to be discharged with close follow-up  Care handoff to [facility], PA-C at shift change. Please see their note for further evaluation and dispo.   Final Clinical Impression(s) / ED Diagnoses Final diagnoses:  None    Rx / DC Orders ED Discharge Orders     None        Nestor Lewandowsky 09/13/21 1529    Lucrezia Starch, MD 09/14/21 1355

## 2021-09-11 NOTE — Discharge Instructions (Signed)
Imaging of your head and chest were negative.  Lab work was also reassuring.  This is likely a migraine concern that is better with a migraine cocktail.  I would like for you to follow-up with your primary care provider.  Please turn to the emerge apartment sooner if you experience worsening symptoms, intractable vomiting, weakness or numbness to your arms or legs, trouble speaking, facial droop, or other concerns you may have.

## 2021-09-22 ENCOUNTER — Other Ambulatory Visit: Payer: Self-pay | Admitting: Orthopedic Surgery

## 2021-09-22 DIAGNOSIS — M25561 Pain in right knee: Secondary | ICD-10-CM

## 2021-09-25 ENCOUNTER — Ambulatory Visit (INDEPENDENT_AMBULATORY_CARE_PROVIDER_SITE_OTHER): Payer: Medicare Other | Admitting: Licensed Clinical Social Worker

## 2021-09-25 ENCOUNTER — Other Ambulatory Visit: Payer: Self-pay

## 2021-09-25 DIAGNOSIS — F411 Generalized anxiety disorder: Secondary | ICD-10-CM

## 2021-09-25 DIAGNOSIS — F4310 Post-traumatic stress disorder, unspecified: Secondary | ICD-10-CM

## 2021-09-25 DIAGNOSIS — F331 Major depressive disorder, recurrent, moderate: Secondary | ICD-10-CM

## 2021-09-25 NOTE — Progress Notes (Signed)
Virtual Visit via Video Note   I connected with Meghan [name] on 09/25/21 at 2:00pm by video enabled telemedicine application and verified that I am speaking with the correct person using two identifiers.   I discussed the limitations, risks, security and privacy concerns of performing an evaluation and management service by video and the availability of in person appointments. I also discussed with the patient that there may be a patient responsible charge related to this service. The patient expressed understanding and agreed to proceed.   I discussed the assessment and treatment plan with the patient. The patient was provided an opportunity to ask questions and all were answered. The patient agreed with the plan and demonstrated an understanding of the instructions.   The patient was advised to call back or seek an in-person evaluation if the symptoms worsen or if the condition fails to improve as anticipated.   I provided 45 minutes of non-face-to-face time during this encounter.     [name], LCSW, LCAS _______________________ THERAPIST PROGRESS NOTE   Session Time: 2:00pm - 2:45pm   Location: Patient: Patient home    Provider: [facility]    Participation Level: Active    Behavioral Response: Alert, casually dressed, irritable mood/affect   Type of Therapy:  Individual Therapy   Treatment Goals addressed: Mood management; Medication management; Maintaining healthy boundaries within support network    Interventions: CBT, psychoeducation on healthy boundaries    Summary: Meghan [name] is a 56 year old divorced African American female that presented for therapy appointment today with diagnoses of Major Depressive Disorder, recurrent, moderate, Generalized Anxiety Disorder and PTSD.      Suicidal/Homicidal: None; without plan or intent                                                                                                                Therapist Response:  Clinician  met with Meghan [name] for virtual therapy appointment and assessed for safety, sobriety, and medication compliance.  Meghan [name] presented for session on time and spoke in a manner that was alert, oriented x5, with no evidence or self-report of active SI/HI or A/V H.  Meghan [name] reported that she continues taking medication as prescribed and denied any use of alcohol or illicit substances.  Clinician inquired about [name]'s current emotional ratings, as well as any significant changes in thoughts, feelings, or behavior since previous check-in.  Meghan [name] reported scores of 0/10 for depression, 0/10 for anxiety, and 10/10 for irritability.  [name] also denied experiencing any recent panic attacks or outbursts.  [name] reported that a recent struggle which has affected her present irritation level is dealing with her neighbor, who is very 'nosy' and stated "He reminds me of my dad.  He always wants to know where I'm going, when I'll be back, and who I'll be spending time with".  Clinician discussed topic of healthy personal boundaries with Meghan [name] today to assist.  This included review of the common boundary types (I.e. porous, rigid, and healthy) as well as typical traits  of each to help differentiate between them.  Clinician inquired about the specific boundary traits present in this current friendship which have contributed to ongoing interpersonal problems, and offered strategies for resolving this issue, such as practice of assertive responses Meghan [name] can utilize in order to express her feelings respectfully, but clearly.  Intervention was effective, as evidenced by [employer] participating in discussion on subject, reporting that she used to have very porous boundaries with her children, which greatly affected her depression and anxiety levels, so she will implement similarly assertive approach in order to establish a healthier boundary with her friend that allows for more physical and emotional space to prevent overinvolvement in their  problems.  Meghan [name] stated "He acts like one of my kids sometimes, and is too clingy.  If I don't do something about it soon its going to just get worse.  The old version of me would just cuss him out, so I am definitely changing".  Meghan [name] participated in roleplay scenario successfully to practice assertive approach as well.  Clinician will continue to monitor.                                     Plan: Follow up again in 1 week.    Diagnosis: Major depressive disorder, recurrent, moderate; Generalized Anxiety Disorder; and PTSD   [name], LCSW, LCAS 09/25/21

## 2021-09-29 ENCOUNTER — Other Ambulatory Visit: Payer: Self-pay

## 2021-09-29 ENCOUNTER — Ambulatory Visit
Admission: RE | Admit: 2021-09-29 | Discharge: 2021-09-29 | Disposition: A | Discharge status: Home / Self Care | Payer: Medicare Other | Source: Ambulatory Visit | Attending: Endocrinology | Admitting: Endocrinology

## 2021-09-29 DIAGNOSIS — Z1231 Encounter for screening mammogram for malignant neoplasm of breast: Secondary | ICD-10-CM

## 2021-10-02 ENCOUNTER — Ambulatory Visit (INDEPENDENT_AMBULATORY_CARE_PROVIDER_SITE_OTHER): Payer: Medicare Other | Admitting: Licensed Clinical Social Worker

## 2021-10-02 ENCOUNTER — Other Ambulatory Visit: Payer: Self-pay

## 2021-10-02 DIAGNOSIS — F331 Major depressive disorder, recurrent, moderate: Secondary | ICD-10-CM | POA: Diagnosis not present

## 2021-10-02 DIAGNOSIS — F4310 Post-traumatic stress disorder, unspecified: Secondary | ICD-10-CM

## 2021-10-02 DIAGNOSIS — F411 Generalized anxiety disorder: Secondary | ICD-10-CM
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
## 2021-10-08 ENCOUNTER — Ambulatory Visit
Admission: RE | Admit: 2021-10-08 | Discharge: 2021-10-08 | Disposition: A | Discharge status: Home / Self Care | Payer: Medicare Other | Source: Ambulatory Visit | Attending: Orthopedic Surgery | Admitting: Orthopedic Surgery

## 2021-10-08 ENCOUNTER — Other Ambulatory Visit: Payer: Self-pay

## 2021-10-08 DIAGNOSIS — M25561 Pain in right knee: Secondary | ICD-10-CM

## 2021-10-09 ENCOUNTER — Ambulatory Visit (INDEPENDENT_AMBULATORY_CARE_PROVIDER_SITE_OTHER): Payer: Medicare Other | Admitting: Licensed Clinical Social Worker

## 2021-10-09 DIAGNOSIS — F331 Major depressive disorder, recurrent, moderate: Secondary | ICD-10-CM | POA: Diagnosis not present

## 2021-10-09 DIAGNOSIS — F4310 Post-traumatic stress disorder, unspecified: Secondary | ICD-10-CM | POA: Diagnosis not present

## 2021-10-09 DIAGNOSIS — F411 Generalized anxiety disorder: Secondary | ICD-10-CM
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
## 2021-10-16 ENCOUNTER — Ambulatory Visit (HOSPITAL_COMMUNITY): Payer: Medicare Other | Admitting: Licensed Clinical Social Worker

## 2021-10-18 ENCOUNTER — Other Ambulatory Visit: Payer: Self-pay | Admitting: Orthopedic Surgery

## 2021-10-26 ENCOUNTER — Other Ambulatory Visit: Payer: Self-pay | Admitting: Neurology

## 2021-10-30 ENCOUNTER — Other Ambulatory Visit: Payer: Self-pay

## 2021-10-30 ENCOUNTER — Ambulatory Visit (INDEPENDENT_AMBULATORY_CARE_PROVIDER_SITE_OTHER): Payer: Medicare Other | Admitting: Licensed Clinical Social Worker

## 2021-10-30 DIAGNOSIS — F411 Generalized anxiety disorder: Secondary | ICD-10-CM

## 2021-10-30 DIAGNOSIS — F4310 Post-traumatic stress disorder, unspecified: Secondary | ICD-10-CM

## 2021-10-30 DIAGNOSIS — F331 Major depressive disorder, recurrent, moderate: Secondary | ICD-10-CM | POA: Diagnosis not present
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
## 2021-11-06 ENCOUNTER — Other Ambulatory Visit: Payer: Self-pay

## 2021-11-06 ENCOUNTER — Ambulatory Visit (INDEPENDENT_AMBULATORY_CARE_PROVIDER_SITE_OTHER): Payer: Medicare Other | Admitting: Licensed Clinical Social Worker

## 2021-11-06 DIAGNOSIS — F411 Generalized anxiety disorder: Secondary | ICD-10-CM | POA: Diagnosis not present

## 2021-11-06 DIAGNOSIS — F4310 Post-traumatic stress disorder, unspecified: Secondary | ICD-10-CM

## 2021-11-06 DIAGNOSIS — F331 Major depressive disorder, recurrent, moderate: Secondary | ICD-10-CM

## 2021-11-06 NOTE — Progress Notes (Signed)
Virtual Visit via Video Note   I connected with Shanti S. Hession on 11/06/21 at 2:00pm by video enabled telemedicine application and verified that I am speaking with the correct person using two identifiers.   I discussed the limitations, risks, security and privacy concerns of performing an evaluation and management service by video and the availability of in person appointments. I also discussed with the patient that there may be a patient responsible charge related to this service. The patient expressed understanding and agreed to proceed.   I discussed the assessment and treatment plan with the patient. The patient was provided an opportunity to ask questions and all were answered. The patient agreed with the plan and demonstrated an understanding of the instructions.   The patient was advised to call back or seek an in-person evaluation if the symptoms worsen or if the condition fails to improve as anticipated.   I provided 1 hour of non-face-to-face time during this encounter.     [name], LCSW, LCAS _______________________ THERAPIST PROGRESS NOTE   Session Time: 2:00pm - 3:00pm   Location: Patient: Patient home    Provider: [facility] [place] Office    Participation Level: Active    Behavioral Response: Alert, casually dressed, anxious mood/affect   Type of Therapy:  Individual Therapy   Treatment Goals addressed: Mood management; Medication management; Church attendance   Progress Towards Goals: Progressing   Interventions: CBT: challenging anxious thoughts    Summary: Ladine S. Seubert is a 56 year old divorced African American female that presented for therapy appointment today with diagnoses of Major Depressive Disorder, recurrent, moderate, Generalized Anxiety Disorder and PTSD.      Suicidal/Homicidal: None; without plan or intent                                                                                                                Therapist Response:  Clinician met with  Orvella for virtual therapy session and assessed for safety, sobriety, and medication compliance.  Reita presented for appointment on time and spoke in a manner that was alert, oriented x5, with no evidence or self-report of active SI/HI or A/V H.  Gola reported ongoing compliance with medication and denied any use of alcohol or illicit substances.  Clinician inquired about Lissete's emotional ratings today, as well as any significant changes in thoughts, feelings, or behavior since last check-in.  Cassadie reported scores of 0/10 for depression, 4/10 for anxiety, and 0/10 for anger/irritability.  Cherylee also denied experiencing any recent panic attacks or outbursts.  Aubrei reported that things have been going well, although she is anxious about an upcoming church event for cultural month that the pastor asked her to organize.  Jaylon stated "This is the first time I've been in charge of something this big".  Clinician utilized handout in session today titled "Worry exploration" in order to assist Jessa in reducing her anxiety related to upcoming church event.  This worksheet featured a series of Socratic questions aimed at exploring the most likely outcomes for a  situation of concern, rather than focusing on the worst possible outcome (i.e. catastrophizing).  Clinician assisted Ananda in identifying and challenging any irrational beliefs related to this worry, in addition to utilizing problem solving approach to explore strategies which would help her accomplish goal of organizing cultural month event successfully.  Jennel actively participated in discussion on handout, reporting that she is worried that the event will not go well, and she will be held responsible for any mistakes.  Varnell reported that there is sufficient evidence to suggest she will do well, including previous work done on [employer], and a successful 'family and friends' day last year.  Geneal stated That was wonderful.  They said I did  a great job.  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  Intervention was effective, as evidenced by Coriana reporting that discussion on this subject reduced her anxiety down to 0/10, and increased her confidence in her ability to organize this event effectively with little issue.  Candie stated I'm not worried about Sunday like I was.  I know I'll be fine".  Clinician will continue to monitor.                                      Plan: Follow up again in 1 week.    Diagnosis: Major depressive disorder, recurrent, moderate; Generalized Anxiety Disorder; and PTSD   Collaboration of Care:   No collaboration of care required for this visit.                                                   Patient/Guardian was advised Release of Information must be obtained prior to any record release in order to collaborate their care with an outside provider. Patient/Guardian was advised if they have not already done so to contact the registration department to sign all necessary forms in order for [name] to release information regarding their care.   Consent: Patient/Guardian gives verbal consent for treatment and assignment of benefits for services provided during this visit. Patient/Guardian expressed understanding and agreed to proceed.  [name], LCSW, LCAS 11/06/21

## 2021-11-09 ENCOUNTER — Encounter (HOSPITAL_BASED_OUTPATIENT_CLINIC_OR_DEPARTMENT_OTHER): Payer: Self-pay

## 2021-11-09 ENCOUNTER — Ambulatory Visit (HOSPITAL_BASED_OUTPATIENT_CLINIC_OR_DEPARTMENT_OTHER): Admit: 2021-11-09 | Payer: Medicare Other | Admitting: Orthopedic Surgery

## 2021-11-09 SURGERY — WRIST ARTHROSCOPY WITH DEBRIDEMENT
Anesthesia: Regional | Site: Wrist | Laterality: Right

## 2021-11-13 ENCOUNTER — Other Ambulatory Visit: Payer: Self-pay

## 2021-11-13 ENCOUNTER — Encounter: Payer: Self-pay | Admitting: Podiatrist

## 2021-11-13 ENCOUNTER — Ambulatory Visit (INDEPENDENT_AMBULATORY_CARE_PROVIDER_SITE_OTHER): Payer: Medicare Other | Admitting: Licensed Clinical Social Worker

## 2021-11-13 ENCOUNTER — Ambulatory Visit (INDEPENDENT_AMBULATORY_CARE_PROVIDER_SITE_OTHER): Payer: Medicare Other

## 2021-11-13 ENCOUNTER — Ambulatory Visit (INDEPENDENT_AMBULATORY_CARE_PROVIDER_SITE_OTHER): Payer: Medicare Other | Admitting: Podiatrist

## 2021-11-13 DIAGNOSIS — F331 Major depressive disorder, recurrent, moderate: Secondary | ICD-10-CM | POA: Diagnosis not present

## 2021-11-13 DIAGNOSIS — M21611 Bunion of right foot: Secondary | ICD-10-CM

## 2021-11-13 DIAGNOSIS — M2041 Other hammer toe(s) (acquired), right foot: Secondary | ICD-10-CM | POA: Diagnosis not present

## 2021-11-13 DIAGNOSIS — F411 Generalized anxiety disorder: Secondary | ICD-10-CM

## 2021-11-13 DIAGNOSIS — F4310 Post-traumatic stress disorder, unspecified: Secondary | ICD-10-CM

## 2021-11-13 DIAGNOSIS — B353 Tinea pedis: Secondary | ICD-10-CM | POA: Diagnosis not present

## 2021-11-13 DIAGNOSIS — E1142 Type 2 diabetes mellitus with diabetic polyneuropathy: Secondary | ICD-10-CM

## 2021-11-13 MED ORDER — KETOCONAZOLE 2 % EX CREA
TOPICAL_CREAM | CUTANEOUS | 4 refills | Status: DC
Start: 2021-11-13 — End: 2022-05-28

## 2021-11-13 NOTE — Progress Notes (Signed)
Virtual Visit via Video Note   I connected with Jamariyah S. Gabbert on 11/13/21 at 2:00pm by video enabled telemedicine application and verified that I am speaking with the correct person using two identifiers.   I discussed the limitations, risks, security and privacy concerns of performing an evaluation and management service by video and the availability of in person appointments. I also discussed with the patient that there may be a patient responsible charge related to this service. The patient expressed understanding and agreed to proceed.   I discussed the assessment and treatment plan with the patient. The patient was provided an opportunity to ask questions and all were answered. The patient agreed with the plan and demonstrated an understanding of the instructions.   The patient was advised to call back or seek an in-person evaluation if the symptoms worsen or if the condition fails to improve as anticipated.   I provided 1 hour of non-face-to-face time during this encounter.     [name], LCSW, LCAS _______________________ THERAPIST PROGRESS NOTE   Session Time: 2:00pm - 3:00pm   Location: Patient: Patient home    Provider: [facility] [place] [facility]    Participation Level: Active    Behavioral Response: Alert, casually dressed, irritable mood/affect   Type of Therapy:  Individual Therapy   Treatment Goals addressed: Mood management; Medication management   Progress Towards Goals: Progressing   Interventions: CBT: stress management     Summary: Maliah S. Lottes is a 56 year old divorced African American female that presented for therapy appointment today with diagnoses of Major Depressive Disorder, recurrent, moderate, Generalized Anxiety Disorder and PTSD.      Suicidal/Homicidal: None; without plan or intent                                                                                                                Therapist Response:  Clinician met with Kaniyah for virtual therapy  appointment and assessed for safety, sobriety, and medication compliance.  Rebacca presented for session on time and spoke in a manner that was alert, oriented x5, with no evidence or self-report of active SI/HI or A/V H.  Embree reported that she continues taking medication as prescribed and denied any use of alcohol or illicit substances.  Clinician inquired about Kiari's current emotional ratings, as well as any significant changes in thoughts, feelings, or behavior since previous check-in.  Havanah reported scores of 0/10 for depression, 2/10 for anxiety, and 5/10 for anger/irritability.  Konstance also denied experiencing any recent panic attacks or outbursts.  Modean reported that things have been stressful this morning, as she had two medical appointments scheduled and when her transportation did not arrive on time, this made her angry, and anxious, stating "I was going off in that [place]".  Abygale reported that she has calmed down a bit, but still irritable, and almost had a panic attack earlier today due to the stress. Clinician processed this recent event with Annis utilizing a stress management approach.  Clinician inquired about internal (I.e. feelings of  anger, anxiety, depression, etc) or external triggers (I.e. people, places, things) which could have influenced her mood.  Clinician explored stress management strategies with Roxan that could have been implemented during crisis to help her calm down, including meditation, deep breathing, and more.  Clinician praised [name] for showing restraint and making progress in mood management.  Intervention was effective, as evidenced by Jalisha's engagement in discussion on recent triggers, stressors, and use of coping skills learned from therapy to improve mood management.  Ekam stated "I always feel better after talking about these things with you".  Clinician will continue to monitor.                                      Plan: Follow up again in 1 week.    Diagnosis:  Major depressive disorder, recurrent, moderate; Generalized Anxiety Disorder; and PTSD   Collaboration of Care:   No collaboration of care required for this visit.                                                   Patient/Guardian was advised Release of Information must be obtained prior to any record release in order to collaborate their care with an outside provider. Patient/Guardian was advised if they have not already done so to contact the registration department to sign all necessary forms in order for [name] to release information regarding their care.    Consent: Patient/Guardian gives verbal consent for treatment and assignment of benefits for services provided during this visit. Patient/Guardian expressed understanding and agreed to proceed.  [name], LCSW, LCAS 11/13/21

## 2021-11-13 NOTE — Patient Instructions (Signed)

## 2021-11-13 NOTE — Progress Notes (Signed)
Chief Complaint  Patient presents with   Diabetes    Foot exam      HPI: Patient is 56 y.o. female who presents today for foot pain on the right foot and for a diabetic foot exam.  She relates her right foot has been hurting her around the big toe and it hurts when she walks. The pain has become more constant.  She also relates some numbness in her feet at times.  Relates she has no sharp or shooting pains and no calf pain when walking.   Patient Active Problem List   Diagnosis Date Noted   Intractable nausea and vomiting 11/16/2020   Ileus ([place]) 11/14/2020   Fecal impaction ([place]) 11/14/2020   Left-sided weakness 09/29/2020   Asthma 09/29/2020   Pre-diabetes 09/29/2020   Pain in left finger(s) 07/12/2020   Seizure-like activity ([place]) 03/25/2020   History of head injury 03/25/2020   Obstructive sleep apnea syndrome 09/04/2019   Acute medial meniscus tear of right knee 07/10/2019   Class 3 severe obesity due to excess calories with serious comorbidity and body mass index (BMI) of 50.0 to 59.9 in adult [facility]) 06/01/2019   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   Atrial fibrillation ([place]) 04/17/2019   History of pulmonary embolus (PE) 04/17/2019    Current Outpatient Medications on File Prior to Visit  Medication Sig Dispense Refill   acetaminophen (TYLENOL) 500 MG tablet Take 1,000 mg by mouth every 6 (six) hours as needed for moderate pain.     albuterol (VENTOLIN HFA) 108 (90 Base) MCG/ACT inhaler Inhale 2 puffs into the lungs every 6 (six) hours as needed for wheezing or shortness of breath.     atorvastatin (LIPITOR) 40 MG tablet Take 40 mg by mouth daily.     BREZTRI AEROSPHERE 160-9-4.8 MCG/ACT AERO Inhale 2 puffs into the lungs in the morning and at bedtime.      Cholecalciferol (VITAMIN D-3) 25 MCG (1000 UT) CAPS Take 1,000 Units by mouth daily.     flecainide (TAMBOCOR) 50 MG tablet Take 50 mg by mouth 2 (two) times daily.      furosemide (LASIX) 40 MG tablet Take 40 mg by  mouth daily with breakfast.      levETIRAcetam (KEPPRA) 500 MG tablet Take 1 tablet (500 mg total) by mouth 2 (two) times daily. 180 tablet 3   LORazepam (ATIVAN) 1 MG tablet Take 2 tablets prior to leaving house on day of office surgery.  Bring third tablet with you to the office on the day of office surgery. (Patient not taking: Reported on 09/11/2021) 2 tablet 0   meclizine (ANTIVERT) 25 MG tablet Take 1 tablet (25 mg total) by mouth 3 (three) times daily as needed for dizziness. (Patient not taking: Reported on 09/06/2021) 30 tablet 0   metFORMIN (GLUCOPHAGE) 500 MG tablet Take 500 mg by mouth daily with breakfast.     methocarbamol (ROBAXIN) 500 MG tablet Take 1 tablet (500 mg total) by mouth 2 (two) times daily. (Patient not taking: Reported on 09/11/2021) 20 tablet 0   metoprolol succinate (TOPROL-XL) 25 MG 24 hr tablet Take 25 mg by mouth daily.     Multiple Vitamins-Minerals (PRESERVISION AREDS 2 PO) Take 2 capsules by mouth daily with breakfast.      Omega-3 Fatty Acids (FISH OIL) 1000 MG CAPS Take 1,000 mg by mouth daily with breakfast.      omeprazole (PRILOSEC) 40 MG capsule Take 40 mg by mouth daily.     rivaroxaban (XARELTO) 20 MG  TABS tablet Take 20 mg by mouth at bedtime.      RYBELSUS 14 MG TABS Take 14 mg by mouth daily.     sertraline (ZOLOFT) 50 MG tablet Take 50 mg by mouth daily.     No current facility-administered medications on file prior to visit.    Allergies  Allergen Reactions   Acetaminophen-Codeine Swelling, Rash and Other (See Comments)    Tylenol with Codeine, Tylenol #3,  (facial swelling, hives)   Other Hives and Other (See Comments)    Muscle relaxer that starts with a "T" caused hives (not tizanidine) January or February 2020- made the patient VERY sick   Tramadol Hives and Other (See Comments)    Patient stated "I was trippin' and I do not want that ever again"   Codeine Swelling    hives   Coconut Oil Rash and Other (See Comments)    ANY coconut  products    Meloxicam Rash   Tomato Rash    Review of Systems No fevers, chills, nausea, muscle aches, no difficulty breathing, no calf pain, no chest pain or shortness of breath.   Physical Exam  GENERAL APPEARANCE: Alert, conversant. Appropriately groomed. No acute distress.   VASCULAR: Pedal pulses palpable DP and PT bilateral.  Capillary refill time is immediate to all digits,  Proximal to distal cooling it warm to warm.  Digital perfusion adequate.   NEUROLOGIC: sensation is intact to 5.07 monofilament at 3/5 sites bilateral.  Light touch is intact bilateral, vibratory sensation decreased bilateral  MUSCULOSKELETAL: acceptable muscle strength, tone and stability bilateral.  Right foot bunion deformity present with hallux abducted and underlying the second toe.  Second toe is sitting atop the hallux.  Hallux deformity is non flexible and uncomfortable.  Left foot has a mild to moderate bunion deformity as well.  Flat feet noted bilateral.   DERMATOLOGIC: skin is warm, supple, and dry.  No open lesions noted.  No rash, no pre ulcerative lesions. Dry and scaling skin is noted plantar right midfoot and heel.  Patient relates the skin is itchy.  Left foot plantar skin in normal.   Xrays:  3 views of the right foot are obtained. Bunion deformity noted with atavistic cuneiform and lateral deviation of the hallux.  Bone spur on the head of the first metatarsal, laterally is also seen.  Second toe is contracted and hallux is under riding the toe.  Pes planus foot seen. No acute osseous abnormalities noted.   Assessment     ICD-10-CM   1. Bunion, right foot  M21.611 DG Foot Complete Right    2. Hammertoe of second toe of right foot  M20.41     3. Tinea pedis of right foot  B35.3     4. Diabetic peripheral neuropathy associated with type 2 diabetes mellitus (HCC)  E11.42        Plan -Discussed exam findings with the patient.  Discussed her right foot pain and established the pain is  stemming from the bunion and hammertoe deformities.  I recommended she follow up with Dr. Posey Pronto to discuss potential for surgical correction and options. -also recommended diabetic shoes and accommodative inserts as I feel it will help with the bunion on the left as well as help with her overall foot health with her flat feet. - rx for ketoconazole called in to her pharmacy. - 2 appointments will be set up:  1 with Dr. Posey Pronto and a second with Aaron Edelman to discuss diabetic shoes.  -  xrays were taken today in anticipation for her appt with Dr. Posey Pronto.

## 2021-11-16 ENCOUNTER — Encounter: Payer: Self-pay | Admitting: Primary Care

## 2021-11-16 ENCOUNTER — Other Ambulatory Visit: Payer: Self-pay

## 2021-11-16 ENCOUNTER — Ambulatory Visit (INDEPENDENT_AMBULATORY_CARE_PROVIDER_SITE_OTHER): Payer: Medicare Other | Admitting: Primary Care

## 2021-11-16 DIAGNOSIS — G4733 Obstructive sleep apnea (adult) (pediatric): Secondary | ICD-10-CM | POA: Diagnosis not present

## 2021-11-16 NOTE — Progress Notes (Signed)
@Patient  ID: Meghan [name], female    DOB: 07-04-1966, 56 y.o.   MRN: [id number]  Chief Complaint  Patient presents with   Consult    Referring provider: Beverley Fiedler, *  HPI: 56 year old female, never smoked.  Past medical history significant for asthma, obstructive sleep apnea, A-fib, obesity.  11/16/2021 Patient presents today for sleep consult. She is here to establish as a new patient. She has a history of sleep apnea, currently on CPAP. She wears CPAP every night. She is unsure setting. Over the last two months she has been consistently waking up at 2-3am and reports having trouble falling back asleep. She goes to bed around 9-10pm and starts her day between 7:30-8am. She has tried several different types of melatonin without improvement. She did not want to take diphenhydramine d.t kidneys. Her current resmed CPAP machine is working well. She wears size large full face mask. No large airleaks that she is aware of. She recently received new supplies. DME compnay is [facility].   Sleep questionnaire Symptoms- Snoring, restless sleep, daytime sleepiness      Prior sleep study- In 2020 ( we do not have copy) Bedtime- 9-10pm  Time to fall asleep- 15-20 mins Nocturnal awakenings- once around 2-3am  Out of bed/start of day- 7:30-8am  Weight changes- down 48lbs Do you operate heavy machinery- No Do you currently wear CPAP- Yes Do you current wear oxygen- Yes [place]- 16   Allergies  Allergen Reactions   Acetaminophen-Codeine Swelling, Rash and Other (See Comments)    Tylenol with Codeine, Tylenol #3,  (facial swelling, hives)   Other Hives and Other (See Comments)    Muscle relaxer that starts with a "T" caused hives (not tizanidine) January or February 2020- made the patient VERY sick   Tramadol Hives and Other (See Comments)    Patient stated "I was trippin' and I do not want that ever again"   Codeine Swelling    hives   Coconut Oil Rash and Other (See Comments)     ANY coconut products    Meloxicam Rash   Tomato Rash    Immunization History  Administered Date(s) Administered   Influenza Split 05/22/2019, 07/18/2021   Influenza, High Dose Seasonal PF 05/22/2019   Influenza-Unspecified 05/28/2019   PFIZER(Purple Top)SARS-COV-2 Vaccination 12/18/2019, 01/18/2020    Past Medical History:  Diagnosis Date   Arthritis    left knee   Asthma    Atrial fibrillation (HCC)    Atrial fibrillation ([place]) 02/16/2019   CHF (congestive heart failure) ([place]) 01/2019   Physician from [place] informed her she had this    Depression    Dysrhythmia    Atrial fibrillation   GERD (gastroesophageal reflux disease)    Hiatal hernia    History of pulmonary embolus ([facility])    Hypercholesterolemia    Macular degeneration    of left eye   Osteoarthritis    knees   Pneumonia    in the past   Pre-diabetes    Pulmonary embolism ([place])    bilat   Seizure ([place])    Seizure disorder ([place]) 2017   Sleep apnea CPAP   [facility]   Stroke ([place])    1989 and 1995 (left sided weakness)   Vitamin D deficiency     Tobacco History: Social History   Tobacco Use  Smoking Status Never  Smokeless Tobacco Never   Counseling given: Not Answered   Outpatient Medications Prior to Visit  Medication Sig Dispense Refill   acetaminophen (  TYLENOL) 500 MG tablet Take 1,000 mg by mouth every 6 (six) hours as needed for moderate pain.     albuterol (VENTOLIN HFA) 108 (90 Base) MCG/ACT inhaler Inhale 2 puffs into the lungs every 6 (six) hours as needed for wheezing or shortness of breath.     atorvastatin (LIPITOR) 40 MG tablet Take 40 mg by mouth daily.     BREZTRI AEROSPHERE 160-9-4.8 MCG/ACT AERO Inhale 2 puffs into the lungs in the morning and at bedtime.      Cholecalciferol (VITAMIN D-3) 25 MCG (1000 UT) CAPS Take 1,000 Units by mouth daily.     flecainide (TAMBOCOR) 50 MG tablet Take 50 mg by mouth 2 (two) times daily.      ketoconazole (NIZORAL) 2 % cream Apply to bottoms of feet  daily for 4 weeks. 60 g 4   levETIRAcetam (KEPPRA) 500 MG tablet Take 1 tablet (500 mg total) by mouth 2 (two) times daily. 180 tablet 3   metFORMIN (GLUCOPHAGE) 500 MG tablet Take 500 mg by mouth daily with breakfast.     methocarbamol (ROBAXIN) 500 MG tablet Take 1 tablet (500 mg total) by mouth 2 (two) times daily. 20 tablet 0   metoprolol succinate (TOPROL-XL) 25 MG 24 hr tablet Take 25 mg by mouth daily.     Omega-3 Fatty Acids (FISH OIL) 1000 MG CAPS Take 1,000 mg by mouth daily with breakfast.      omeprazole (PRILOSEC) 40 MG capsule Take 40 mg by mouth daily.     rivaroxaban (XARELTO) 20 MG TABS tablet Take 20 mg by mouth at bedtime.      RYBELSUS 14 MG TABS Take 14 mg by mouth daily.     sertraline (ZOLOFT) 50 MG tablet Take 50 mg by mouth daily.     furosemide (LASIX) 40 MG tablet Take 40 mg by mouth daily with breakfast.      LORazepam (ATIVAN) 1 MG tablet Take 2 tablets prior to leaving house on day of office surgery.  Bring third tablet with you to the office on the day of office surgery. (Patient not taking: Reported on 09/11/2021) 2 tablet 0   meclizine (ANTIVERT) 25 MG tablet Take 1 tablet (25 mg total) by mouth 3 (three) times daily as needed for dizziness. (Patient not taking: Reported on 09/06/2021) 30 tablet 0   Multiple Vitamins-Minerals (PRESERVISION AREDS 2 PO) Take 2 capsules by mouth daily with breakfast.      No facility-administered medications prior to visit.   Review of Systems  Review of Systems  Constitutional: Negative.   HENT: Negative.    Respiratory: Negative.      Physical Exam  BP 120/60 (BP Location: Left Wrist, Cuff Size: Large)    Pulse 63    Temp 98.6 F (37 C) (Temporal)    Ht 5\' 5"  (1.651 m)    Wt 287 lb 12.8 oz (130.5 kg)    SpO2 98%    BMI 47.89 kg/m  Physical Exam Constitutional:      Appearance: Normal appearance.  HENT:     Head: Normocephalic and atraumatic.     Mouth/Throat:     Comments: Deferred d/t masking  Cardiovascular:      Rate and Rhythm: Normal rate and regular rhythm.  Pulmonary:     Effort: Pulmonary effort is normal.     Breath sounds: Normal breath sounds. No wheezing, rhonchi or rales.  Musculoskeletal:        General: Normal range of motion.  Skin:  General: Skin is warm and dry.  Neurological:     General: No focal deficit present.     Mental Status: She is alert and oriented to person, place, and time. Mental status is at baseline.  Psychiatric:        Mood and Affect: Mood normal.        Behavior: Behavior normal.        Thought Content: Thought content normal.        Judgment: Judgment normal.     Lab Results:  CBC    Component Value Date/Time   WBC 6.3 09/11/2021 1502   RBC 3.63 (L) 09/11/2021 1502   HGB 10.1 (L) 09/11/2021 1502   HCT 31.1 (L) 09/11/2021 1502   PLT 265 09/11/2021 1502   MCV 85.7 09/11/2021 1502   MCH 27.8 09/11/2021 1502   MCHC 32.5 09/11/2021 1502   RDW 14.6 09/11/2021 1502   LYMPHSABS 1.7 09/11/2021 1502   MONOABS 0.5 09/11/2021 1502   EOSABS 0.1 09/11/2021 1502   BASOSABS 0.0 09/11/2021 1502    BMET    Component Value Date/Time   NA 139 09/11/2021 1502   K 4.0 09/11/2021 1502   CL 105 09/11/2021 1502   CO2 26 09/11/2021 1502   GLUCOSE 92 09/11/2021 1502   BUN 18 09/11/2021 1502   CREATININE 0.84 09/11/2021 1502   CALCIUM 9.9 09/11/2021 1502   GFRNONAA >60 09/11/2021 1502   GFRAA >60 05/11/2020 1701    BNP    Component Value Date/Time   BNP 92.4 02/01/2021 1034    ProBNP No results found for: PROBNP  Imaging: DG Foot Complete Right  Result Date: 11/13/2021 Please see detailed radiograph report in office note.    Assessment & Plan:   Obstructive sleep apnea syndrome - Patient has diagnosis of sleep apnea, she had a sleep study in 2020. She is currently on CPAP but unsure of settings. She has been having issues maintaining sleep the last 2 months. She is consistently waking up between 2-3am. No improvement with melatonin. We will  request copy of her sleep study and compliance report from [facility]. If needed will adjust pressure settings. May need repeat sleep study or CPAP titration. FU in 4-6 weeks virtual visit.    Martyn Ehrich, NP 11/16/2021

## 2021-11-16 NOTE — Progress Notes (Signed)
Reviewed and agree with assessment/plan. ? ? ?Chesley Mires, MD ?[place] ?11/16/2021, 11:59 AM ?Pager:  [phone] ? ?

## 2021-11-16 NOTE — Patient Instructions (Addendum)
We need to get a copy of your sleep study and compliance report from current CPAP machine. We have contacted [facility] to help with this. We may need you to either bring SD card if you have one or machine to [facility] to have them pull report for [name] to look are pressure setting and data.  ? ?Recommendations: ?Elevate head of bed at night while sleeping if possible/ you can try wedge pillow  ?Do not drive if experiencing excessive daytime sleepiness  ? ?Follow-up: ?4-6 weeks with Beth NP or sooner if needed  ? ? ?CPAP and BIPAP Information ?CPAP and BIPAP are methods that use air pressure to keep your airways open and to help you breathe well. CPAP and BIPAP use different amounts of pressure. Your health care provider will tell you whether CPAP or BIPAP would be more helpful for you. ?CPAP stands for "continuous positive airway pressure." With CPAP, the amount of pressure stays the same while you breathe in (inhale) and out (exhale). ?BIPAP stands for "bi-level positive airway pressure." With BIPAP, the amount of pressure will be higher when you inhale and lower when you exhale. This allows you to take larger breaths. ?CPAP or BIPAP may be used in the hospital, or your health care provider may want you to use it at home. You may need to have a sleep study before your health care provider can order a machine for you to use at home. ?What are the advantages? ?CPAP or BIPAP can be helpful if you have: ?Sleep apnea. ?Chronic obstructive pulmonary disease (COPD). ?Heart failure. ?Medical conditions that cause muscle weakness, including muscular dystrophy or amyotrophic lateral sclerosis (ALS). ?Other problems that cause breathing to be shallow, weak, abnormal, or difficult. ?CPAP and BIPAP are most commonly used for obstructive sleep apnea (OSA) to keep the airways from collapsing when the muscles relax during sleep. ?What are the risks? ?Generally, this is a safe treatment. However, problems may occur,  including: ?Irritated skin or skin sores if the mask does not fit properly. ?Dry or stuffy nose or nosebleeds. ?Dry mouth. ?Feeling gassy or bloated. ?Sinus or lung infection if the equipment is not cleaned properly. ?When should CPAP or BIPAP be used? ?In most cases, the mask only needs to be worn during sleep. Generally, the mask needs to be worn throughout the night and during any daytime naps. People with certain medical conditions may also need to wear the mask at other times, such as when they are awake. Follow instructions from your health care provider about when to use the machine. ?What happens during CPAP or BIPAP? ?Both CPAP and BIPAP are provided by a small machine with a flexible plastic tube that attaches to a plastic mask that you wear. Air is blown through the mask into your nose or mouth. The amount of pressure that is used to blow the air can be adjusted on the machine. Your health care provider will set the pressure setting and help you find the best mask for you. ?Tips for using the mask ?Because the mask needs to be snug, some people feel trapped or closed-in (claustrophobic) when first using the mask. If you feel this way, you may need to get used to the mask. One way to do this is to hold the mask loosely over your nose or mouth and then gradually apply the mask more snugly. You can also gradually increase the amount of time that you use the mask. ?Masks are available in various types and sizes. If your  mask does not fit well, talk with your health care provider about getting a different one. Some common types of masks include: ?Full face masks, which fit over the mouth and nose. ?Nasal masks, which fit over the nose. ?Nasal pillow or prong masks, which fit into the nostrils. ?If you are using a mask that fits over your nose and you tend to breathe through your mouth, a chin strap may be applied to help keep your mouth closed. ?Use a skin barrier to protect your skin as told by your health  care provider. ?Some CPAP and BIPAP machines have alarms that may sound if the mask comes off or develops a leak. ?If you have trouble with the mask, it is very important that you talk with your health care provider about finding a way to make the mask easier to tolerate. Do not stop using the mask. There could be a negative impact on your health if you stop using the mask. ?Tips for using the machine ?Place your CPAP or BIPAP machine on a secure table or stand near an electrical outlet. ?Know where the on/off switch is on the machine. ?Follow instructions from your health care provider about how to set the pressure on your machine and when you should use it. ?Do not eat or drink while the CPAP or BIPAP machine is on. Food or fluids could get pushed into your lungs by the pressure of the CPAP or BIPAP. ?For home use, CPAP and BIPAP machines can be rented or purchased through home health care companies. Many different brands of machines are available. Renting a machine before purchasing may help you find out which particular machine works well for you. Your health insurance company may also decide which machine you may get. ?Keep the CPAP or BIPAP machine and attachments clean. Ask your health care provider for specific instructions. ?Check the humidifier if you have a dry stuffy nose or nosebleeds. Make sure it is working correctly. ?Follow these instructions at home: ?Take over-the-counter and prescription medicines only as told by your health care provider. Ask if you can take sinus medicine if your sinuses are blocked. ?Do not use any products that contain nicotine or tobacco. These products include cigarettes, chewing tobacco, and vaping devices, such as e-cigarettes. If you need help quitting, ask your health care provider. ?Keep all follow-up visits. This is important. ?Contact a health care provider if: ?You have redness or pressure sores on your head, face, mouth, or nose from the mask or head gear. ?You  have trouble using the CPAP or BIPAP machine. ?You cannot tolerate wearing the CPAP or BIPAP mask. ?Someone tells you that you snore even when wearing your CPAP or BIPAP. ?Get help right away if: ?You have trouble breathing. ?You feel confused. ?Summary ?CPAP and BIPAP are methods that use air pressure to keep your airways open and to help you breathe well. ?If you have trouble with the mask, it is very important that you talk with your health care provider about finding a way to make the mask easier to tolerate. Do not stop using the mask. There could be a negative impact to your health if you stop using the mask. ?Follow instructions from your health care provider about when to use the machine. ?This information is not intended to replace advice given to you by your health care provider. Make sure you discuss any questions you have with your health care provider. ?Document Revised: 04/12/2021 Document Reviewed: 08/12/2020 ?Elsevier Patient Education ? 2022 [employer] ? ? ?

## 2021-11-16 NOTE — Assessment & Plan Note (Addendum)
-   Patient has diagnosis of sleep apnea, she had a sleep study in 2020. She is currently on CPAP but unsure of settings. She has been having issues maintaining sleep the last 2 months. She is consistently waking up between 2-3am. No improvement with melatonin. We will request copy of her sleep study and compliance report from [facility]. If needed will adjust pressure settings. May need repeat sleep study or CPAP titration. FU in 4-6 weeks virtual visit.  ?

## 2021-11-20 ENCOUNTER — Ambulatory Visit (INDEPENDENT_AMBULATORY_CARE_PROVIDER_SITE_OTHER): Payer: Medicare Other | Admitting: Licensed Clinical Social Worker

## 2021-11-20 ENCOUNTER — Other Ambulatory Visit: Payer: Self-pay

## 2021-11-20 DIAGNOSIS — F4310 Post-traumatic stress disorder, unspecified: Secondary | ICD-10-CM

## 2021-11-20 DIAGNOSIS — F331 Major depressive disorder, recurrent, moderate: Secondary | ICD-10-CM | POA: Diagnosis not present

## 2021-11-20 DIAGNOSIS — F411 Generalized anxiety disorder: Secondary | ICD-10-CM

## 2021-11-20 NOTE — Progress Notes (Signed)
Virtual Visit via Video Note ?  ?I connected with Meghan [name] on 11/20/21 at 2:00pm by video enabled telemedicine application and verified that I am speaking with the correct person using two identifiers. ?  ?I discussed the limitations, risks, security and privacy concerns of performing an evaluation and management service by video and the availability of in person appointments. I also discussed with the patient that there may be a patient responsible charge related to this service. The patient expressed understanding and agreed to proceed. ?  ?I discussed the assessment and treatment plan with the patient. The patient was provided an opportunity to ask questions and all were answered. The patient agreed with the plan and demonstrated an understanding of the instructions. ?  ?The patient was advised to call back or seek an in-person evaluation if the symptoms worsen or if the condition fails to improve as anticipated. ?  ?I provided 40 minutes of non-face-to-face time during this encounter. ?  ?  ?[name], LCSW, LCAS ?_______________________ ?THERAPIST PROGRESS NOTE ?  ?Session Time: 2:00pm - 2:40pm  ? ?Location: ?Patient: Patient home    ?Provider: [facility]  ?  ?Participation Level: Active  ?  ?Behavioral Response: Alert, casually dressed, exhausted mood/affect ?  ?Type of Therapy:  Individual Therapy ?  ?Treatment Goals addressed: Mood management; Medication management  ? ?Progress Towards Goals: Progressing ?  ?Interventions: CBT, skill building (time management)  ?  ?Summary: Meghan [name] is a 56 year old divorced African American female that presented for therapy appointment today with diagnoses of Major Depressive Disorder, recurrent, moderate, Generalized Anxiety Disorder and PTSD.    ?  ?Suicidal/Homicidal: None; without plan or intent  ?                                                                                                              ?Therapist Response:  Clinician met with Meghan [name] for  virtual therapy session and assessed for safety, sobriety, and medication compliance.  Meghan [name] presented for appointment on time and spoke in a manner that was alert, oriented x5, with no evidence or self-report of active SI/HI or A/V H.  Meghan [name] reported ongoing compliance with medication and denied any use of alcohol or illicit substances.  Clinician inquired about Meghan [name]'s emotional ratings today, as well as any significant changes in thoughts, feelings, or behavior since last check-in.  Meghan [name] reported scores of 0/10 for depression, 0/10 for anxiety, and 0/10 for anger/irritability.  Meghan [name] also denied experiencing any recent panic attacks or outbursts.  Meghan [name] reported that she has maintained a good mood, although she has been extremely busy, and had to miss her great aunt's 100th birthday celebration in [place] over the weekend, stating ?I felt some kind of way about that.  I wanted to go but I had things to take care of at the church?.  Clinician discussed topic of time management today in order to help Meghan [name] with skill building and stress management.  Clinician utilized a handout to guide discussion, which defined time management  as the ability to use one's time to effectively and productively.  Several strategies were covered to assist [place], including using a to-do list and/or calendar to break down tasks and place them in time slots, set reminders via post-it notes or electronic devices to aid memory, prioritize tasks over a period of time to cut down on stress, create realistic timelines for tasks to place in calendar, set SMART (specific, measurable, achievable, relevant, time bound) goals, talk to positive supports for accountability and assistance with intimidating goals, and ensure time for self-care to reward hard work.  Intervention was effective, as evidenced by Meghan [name] actively participating in discussion on subject, and reporting that this reinforced several good habits she has utilized for years such as  keeping a daily to-do list, and setting reminders, in addition to giving her ideas on ways to improve time management moving forward, such as better prioritizing daily tasks, breaking them down into smaller goals, and using her support system to assist with church events which take up most of her time.  Mylin reported that her goals this week would be to get more regular nightly rest due to fatigue she felt today, visit the [facility] x2 for at least 1-2 hours to make progress in completing her book, and coordinate with other church members to assist with upcoming easter event.  Kacy stated "I've definitely been juggling too many things and need to take a break".  Clinician will continue to monitor.   ?                                   ?Plan: Follow up again in 1 week.  ?  ?Diagnosis: Major depressive disorder, recurrent, moderate; Generalized Anxiety Disorder; and PTSD  ? ?Collaboration of Care:   No collaboration of care required for this visit. ?                                                  ?Patient/Guardian was advised Release of Information must be obtained prior to any record release in order to collaborate their care with an outside provider. Patient/Guardian was advised if they have not already done so to contact the registration department to sign all necessary forms in order for [name] to release information regarding their care.  ?  ?Consent: Patient/Guardian gives verbal consent for treatment and assignment of benefits for services provided during this visit. Patient/Guardian expressed understanding and agreed to proceed. ? ?[name], LCSW, LCAS ?11/20/21 ?

## 2021-11-27 ENCOUNTER — Emergency Department (HOSPITAL_COMMUNITY)
Admission: EM | Admit: 2021-11-27 | Discharge: 2021-11-27 | Disposition: A | Discharge status: Home / Self Care | Payer: Medicare Other | Attending: Emergency Medicine | Admitting: Emergency Medicine

## 2021-11-27 ENCOUNTER — Other Ambulatory Visit: Payer: Self-pay

## 2021-11-27 ENCOUNTER — Emergency Department (HOSPITAL_COMMUNITY): Payer: Medicare Other

## 2021-11-27 ENCOUNTER — Ambulatory Visit (INDEPENDENT_AMBULATORY_CARE_PROVIDER_SITE_OTHER): Payer: Medicare Other | Admitting: Licensed Clinical Social Worker

## 2021-11-27 DIAGNOSIS — F331 Major depressive disorder, recurrent, moderate: Secondary | ICD-10-CM

## 2021-11-27 DIAGNOSIS — I509 Heart failure, unspecified: Secondary | ICD-10-CM | POA: Diagnosis not present

## 2021-11-27 DIAGNOSIS — F411 Generalized anxiety disorder: Secondary | ICD-10-CM

## 2021-11-27 DIAGNOSIS — R748 Abnormal levels of other serum enzymes: Secondary | ICD-10-CM | POA: Insufficient documentation

## 2021-11-27 DIAGNOSIS — F4310 Post-traumatic stress disorder, unspecified: Secondary | ICD-10-CM | POA: Diagnosis not present

## 2021-11-27 DIAGNOSIS — R1084 Generalized abdominal pain: Secondary | ICD-10-CM | POA: Diagnosis present

## 2021-11-27 DIAGNOSIS — Z7901 Long term (current) use of anticoagulants: Secondary | ICD-10-CM | POA: Diagnosis not present

## 2021-11-27 DIAGNOSIS — R197 Diarrhea, unspecified: Secondary | ICD-10-CM | POA: Diagnosis not present

## 2021-11-27 DIAGNOSIS — R112 Nausea with vomiting, unspecified: Secondary | ICD-10-CM | POA: Diagnosis not present

## 2021-11-27 LAB — URINALYSIS, ROUTINE W REFLEX MICROSCOPIC
Bilirubin Urine: NEGATIVE
Glucose, UA: NEGATIVE mg/dL
Hgb urine dipstick: NEGATIVE
Ketones, ur: NEGATIVE mg/dL
Leukocytes,Ua: NEGATIVE
Nitrite: NEGATIVE
Protein, ur: NEGATIVE mg/dL
Specific Gravity, Urine: 1.033 — ABNORMAL HIGH (ref 1.005–1.030)
pH: 7 (ref 5.0–8.0)

## 2021-11-27 LAB — CBC WITH DIFFERENTIAL/PLATELET
Abs Immature Granulocytes: 0.01 10*3/uL (ref 0.00–0.07)
Basophils Absolute: 0 10*3/uL (ref 0.0–0.1)
Basophils Relative: 0 %
Eosinophils Absolute: 0.1 10*3/uL (ref 0.0–0.5)
Eosinophils Relative: 2 %
HCT: 33.7 % — ABNORMAL LOW (ref 36.0–46.0)
Hemoglobin: 10.5 g/dL — ABNORMAL LOW (ref 12.0–15.0)
Immature Granulocytes: 0 %
Lymphocytes Relative: 26 %
Lymphs Abs: 1.4 10*3/uL (ref 0.7–4.0)
MCH: 26.5 pg (ref 26.0–34.0)
MCHC: 31.2 g/dL (ref 30.0–36.0)
MCV: 85.1 fL (ref 80.0–100.0)
Monocytes Absolute: 0.4 10*3/uL (ref 0.1–1.0)
Monocytes Relative: 8 %
Neutro Abs: 3.5 10*3/uL (ref 1.7–7.7)
Neutrophils Relative %: 64 %
Platelets: 244 10*3/uL (ref 150–400)
RBC: 3.96 MIL/uL (ref 3.87–5.11)
RDW: 13.6 % (ref 11.5–15.5)
WBC: 5.4 10*3/uL (ref 4.0–10.5)
nRBC: 0 % (ref 0.0–0.2)

## 2021-11-27 LAB — COMPREHENSIVE METABOLIC PANEL
ALT: 14 U/L (ref 0–44)
AST: 12 U/L — ABNORMAL LOW (ref 15–41)
Albumin: 3.1 g/dL — ABNORMAL LOW (ref 3.5–5.0)
Alkaline Phosphatase: 208 U/L — ABNORMAL HIGH (ref 38–126)
Anion gap: 5 (ref 5–15)
BUN: 14 mg/dL (ref 6–20)
CO2: 31 mmol/L (ref 22–32)
Calcium: 10 mg/dL (ref 8.9–10.3)
Chloride: 104 mmol/L (ref 98–111)
Creatinine, Ser: 0.78 mg/dL (ref 0.44–1.00)
GFR, Estimated: >60 mL/min (ref >60)
Glucose, Bld: 98 mg/dL (ref 70–99)
Potassium: 4.3 mmol/L (ref 3.5–5.1)
Sodium: 140 mmol/L (ref 135–145)
Total Bilirubin: 0.4 mg/dL (ref 0.3–1.2)
Total Protein: 6.6 g/dL (ref 6.5–8.1)

## 2021-11-27 LAB — LACTIC ACID, PLASMA: Lactic Acid, Venous: 0.7 mmol/L (ref 0.5–1.9)

## 2021-11-27 LAB — LIPASE, BLOOD: Lipase: 40 U/L (ref 11–51)

## 2021-11-27 MED ORDER — OXYCODONE-ACETAMINOPHEN 5-325 MG PO TABS: 1.0000 | ORAL_TABLET | Freq: Four times a day (QID) | ORAL | 0 refills | Status: DC | PRN

## 2021-11-27 MED ORDER — LIDOCAINE 5 % EX PTCH
1.0000 | MEDICATED_PATCH | CUTANEOUS | Status: DC
  Administered 2021-11-27: 1 via TRANSDERMAL
  Filled 2021-11-27: qty 1

## 2021-11-27 MED ORDER — SODIUM CHLORIDE 0.9 % IV SOLN: INTRAVENOUS | Status: DC

## 2021-11-27 MED ORDER — ONDANSETRON 4 MG PO TBDP
4.0000 mg | ORAL_TABLET | Freq: Three times a day (TID) | ORAL | 0 refills | Status: DC | PRN
Start: 2021-11-27 — End: 2022-07-02

## 2021-11-27 MED ORDER — ONDANSETRON HCL 4 MG/2ML IJ SOLN
4.0000 mg | Freq: Once | INTRAMUSCULAR | Status: AC
  Administered 2021-11-27: 4 mg via INTRAVENOUS
  Filled 2021-11-27: qty 2

## 2021-11-27 MED ORDER — SODIUM CHLORIDE 0.9 % IV BOLUS
1000.0000 mL | Freq: Once | INTRAVENOUS | Status: AC
  Administered 2021-11-27: 1000 mL via INTRAVENOUS

## 2021-11-27 MED ORDER — OXYCODONE-ACETAMINOPHEN 5-325 MG PO TABS
1.0000 | ORAL_TABLET | Freq: Once | ORAL | Status: AC
  Administered 2021-11-27: 1 via ORAL
  Filled 2021-11-27: qty 1

## 2021-11-27 MED ORDER — FENTANYL CITRATE PF 50 MCG/ML IJ SOSY
50.0000 ug | PREFILLED_SYRINGE | Freq: Once | INTRAMUSCULAR | Status: AC
  Administered 2021-11-27: 50 ug via INTRAVENOUS
  Filled 2021-11-27: qty 1

## 2021-11-27 MED ORDER — IOHEXOL 300 MG/ML  SOLN
100.0000 mL | Freq: Once | INTRAMUSCULAR | Status: AC | PRN
  Administered 2021-11-27: 100 mL via INTRAVENOUS

## 2021-11-27 NOTE — ED Provider Notes (Signed)
[facility] EMERGENCY DEPARTMENT Provider Note   CSN: [id number] Arrival date & time: 11/27/21  4098     History  Chief Complaint  Patient presents with   Abdominal Pain    Meghan [name] is a 56 y.o. female.   Abdominal Pain Associated symptoms: diarrhea, nausea and vomiting    56 year old female with a history of abdominal wall hernia status post repair, CHF, OSA on CPAP, CVA, atrial fibrillation, PE status post IVC filter who presents to the emergency department with a chief complaint of abdominal pain.  The patient states that she has a history of abdominal wall hernia.  On chart review, she had umbilical hernia repairs in 2014 and 2018.  She states that she noticed bulging in her abdomen when she bears down.  She endorses epigastric pain that has been ongoing for the last week.  She endorses some mild nausea with associated NBNB emesis.  She endorses a small amount of diarrhea.  Symptoms have been worsening since Saturday.  She denies any fevers or chills.  She is able to keep down crackers and soup but has had decreased appetite.  Home Medications Prior to Admission medications   Medication Sig Start Date End Date Taking? Authorizing Provider  ondansetron (ZOFRAN-ODT) 4 MG disintegrating tablet Take 1 tablet (4 mg total) by mouth every 8 (eight) hours as needed for nausea or vomiting. 11/27/21  Yes Regan Lemming, MD  oxyCODONE-acetaminophen (PERCOCET/ROXICET) 5-325 MG tablet Take 1 tablet by mouth every 6 (six) hours as needed for severe pain. 11/27/21  Yes Regan Lemming, MD  acetaminophen (TYLENOL) 500 MG tablet Take 1,000 mg by mouth every 6 (six) hours as needed for moderate pain.    [provider]  albuterol (VENTOLIN HFA) 108 (90 Base) MCG/ACT inhaler Inhale 2 puffs into the lungs every 6 (six) hours as needed for wheezing or shortness of breath.    [provider]  atorvastatin (LIPITOR) 40 MG tablet Take 40 mg by mouth daily. 07/26/21    [provider]  BREZTRI AEROSPHERE 160-9-4.8 MCG/ACT AERO Inhale 2 puffs into the lungs in the morning and at bedtime.  03/24/20   [provider]  Cholecalciferol (VITAMIN D-3) 25 MCG (1000 UT) CAPS Take 1,000 Units by mouth daily.    [provider]  flecainide (TAMBOCOR) 50 MG tablet Take 50 mg by mouth 2 (two) times daily.  09/04/19   [provider]  furosemide (LASIX) 40 MG tablet Take 40 mg by mouth daily with breakfast.     [provider]  ketoconazole (NIZORAL) 2 % cream Apply to bottoms of feet daily for 4 weeks. 11/13/21   Bronson Ing, DPM  levETIRAcetam (KEPPRA) 500 MG tablet Take 1 tablet (500 mg total) by mouth 2 (two) times daily. 07/04/21   Marcial Pacas, MD  LORazepam (ATIVAN) 1 MG tablet Take 2 tablets prior to leaving house on day of office surgery.  Bring third tablet with you to the office on the day of office surgery. Patient not taking: Reported on 09/11/2021 08/24/21   Waynetta Sandy, MD  meclizine (ANTIVERT) 25 MG tablet Take 1 tablet (25 mg total) by mouth 3 (three) times daily as needed for dizziness. Patient not taking: Reported on 09/06/2021 01/11/21   Domenic Moras, PA-C  metFORMIN (GLUCOPHAGE) 500 MG tablet Take 500 mg by mouth daily with breakfast.    [provider]  methocarbamol (ROBAXIN) 500 MG tablet Take 1 tablet (500 mg total) by mouth 2 (two)  times daily. 12/14/20   Tedd Sias, PA  metoprolol succinate (TOPROL-XL) 25 MG 24 hr tablet Take 25 mg by mouth daily. 05/02/21   [provider]  Multiple Vitamins-Minerals (PRESERVISION AREDS 2 PO) Take 2 capsules by mouth daily with breakfast.     [provider]  Omega-3 Fatty Acids (FISH OIL) 1000 MG CAPS Take 1,000 mg by mouth daily with breakfast.     [provider]  omeprazole (PRILOSEC) 40 MG capsule Take 40 mg by mouth daily. 08/17/20   [provider]  rivaroxaban (XARELTO) 20 MG TABS tablet Take 20 mg by  mouth at bedtime.     [provider]  RYBELSUS 14 MG TABS Take 14 mg by mouth daily. 07/27/21   [provider]  sertraline (ZOLOFT) 50 MG tablet Take 50 mg by mouth daily. 01/01/21   [provider]      Allergies    Acetaminophen-codeine, Other, Tramadol, Codeine, Coconut oil, Meloxicam, and Tomato    Review of Systems   Review of Systems  Gastrointestinal:  Positive for abdominal pain, diarrhea, nausea and vomiting.  All other systems reviewed and are negative.  Physical Exam Updated Vital Signs BP (!) 107/56    Pulse 60    Temp 97.8 F (36.6 C) (Oral)    Resp 19    Ht '5\' 5"'$  (1.651 m)    Wt 127.5 kg    SpO2 100%    BMI 46.76 kg/m  Physical Exam Vitals and nursing note reviewed.  Constitutional:      General: She is not in acute distress. HENT:     Head: Normocephalic and atraumatic.  Eyes:     Conjunctiva/sclera: Conjunctivae normal.     Pupils: Pupils are equal, round, and reactive to light.  Cardiovascular:     Rate and Rhythm: Normal rate and regular rhythm.  Pulmonary:     Effort: Pulmonary effort is normal. No respiratory distress.  Abdominal:     Tenderness: There is abdominal tenderness in the epigastric area and periumbilical area. There is no guarding.     Comments: Abdominal wall hernia present above the umbilicus, not easily reduced and tender to palpation  Musculoskeletal:        General: No deformity or signs of injury.     Cervical back: Neck supple.  Skin:    Findings: No lesion or rash.  Neurological:     General: No focal deficit present.     Mental Status: She is alert. Mental status is at baseline.    ED Results / Procedures / Treatments   Labs (all labs ordered are listed, but only abnormal results are displayed) Labs Reviewed  COMPREHENSIVE METABOLIC PANEL - Abnormal; Notable for the following components:      Result Value   Albumin 3.1 (*)    AST 12 (*)    Alkaline Phosphatase 208 (*)    All other components  within normal limits  CBC WITH DIFFERENTIAL/PLATELET - Abnormal; Notable for the following components:   Hemoglobin 10.5 (*)    HCT 33.7 (*)    All other components within normal limits  URINALYSIS, ROUTINE W REFLEX MICROSCOPIC - Abnormal; Notable for the following components:   Specific Gravity, Urine 1.033 (*)    All other components within normal limits  LIPASE, BLOOD  LACTIC ACID, PLASMA    EKG None  Radiology CT ABDOMEN PELVIS W CONTRAST  Result Date: 11/27/2021 CLINICAL DATA:  Nausea/vomiting Abdominal hernia suspected EXAM: CT ABDOMEN AND PELVIS WITH  CONTRAST TECHNIQUE: Multidetector CT imaging of the abdomen and pelvis was performed using the standard protocol following bolus administration of intravenous contrast. RADIATION DOSE REDUCTION: This exam was performed according to the departmental dose-optimization program which includes automated exposure control, adjustment of the mA and/or kV according to patient size and/or use of iterative reconstruction technique. CONTRAST:  181m OMNIPAQUE IOHEXOL 300 MG/ML  SOLN COMPARISON:  CT 01/11/2021 FINDINGS: Lower chest: Bibasilar ground-glass opacities and scarring. Mildly enlarged heart. Hepatobiliary: No focal liver abnormality is seen. Prior cholecystectomy. Pancreas: Unremarkable. No pancreatic ductal dilatation or surrounding inflammatory changes. Spleen: No focal abnormality. Unchanged small hypodense lesion in the posterior spleen, almost certainly benign. Adrenals/Urinary Tract: Adrenal glands are unremarkable. No hydronephrosis or nephrolithiasis. Bladder is unremarkable. Stomach/Bowel: The stomach is within normal limits. There is no evidence of bowel obstruction.The appendix is normal. Scattered colonic diverticula. Vascular/Lymphatic: There is an IVC filter in place with multiple struts protruding through the IVC lumen, 1 of which abuts the left common iliac artery and others abutting the L4 vertebral body. No AAA. No  lymphadenopathy. Reproductive: Prior hysterectomy. Other: Prior ventral hernia repair. Diastasis recti. No abdominopelvic ascites. No free air. There is a lower abdominal ventral hernia containing fat and nonobstructed small bowel with a 3.8 cm aperture, unchanged from prior exam. Musculoskeletal: No acute or significant osseous findings. IMPRESSION: No acute abdominopelvic abnormality.  Normal appendix. Unchanged small lower abdominal ventral hernia containing fat and nonobstructed small bowel. Electronically Signed   By: JMaurine SimmeringM.D.   On: 11/27/2021 11:15    Procedures Procedures    Medications Ordered in ED Medications  sodium chloride 0.9 % bolus 1,000 mL (1,000 mLs Intravenous New Bag/Given 11/27/21 0929)    And  0.9 %  sodium chloride infusion ( Intravenous New Bag/Given 11/27/21 0929)  lidocaine (LIDODERM) 5 % 1 patch (1 patch Transdermal Patch Applied 11/27/21 1246)  fentaNYL (SUBLIMAZE) injection 50 mcg (50 mcg Intravenous Given 11/27/21 0929)  ondansetron (ZOFRAN) injection 4 mg (4 mg Intravenous Given 11/27/21 0929)  iohexol (OMNIPAQUE) 300 MG/ML solution 100 mL (100 mLs Intravenous Contrast Given 11/27/21 1050)  oxyCODONE-acetaminophen (PERCOCET/ROXICET) 5-325 MG per tablet 1 tablet (1 tablet Oral Given 11/27/21 1246)    ED Course/ Medical Decision Making/ A&P                           Medical Decision Making Amount and/or Complexity of Data Reviewed Labs: ordered. Radiology: ordered.  Risk Prescription drug management.   57year old female with a history of abdominal wall hernia status post repair, CHF, OSA on CPAP, CVA, atrial fibrillation, PE status post IVC filter who presents to the emergency department with a chief complaint of abdominal pain.  The patient states that she has a history of abdominal wall hernia.  On chart review, she had umbilical hernia repairs in 2014 and 2018.  She states that she noticed bulging in her abdomen when she bears down.  She endorses  epigastric pain that has been ongoing for the last week.  She endorses some mild nausea with associated NBNB emesis.  She endorses a small amount of diarrhea.  Symptoms have been worsening since Saturday.  She denies any fevers or chills.  She is able to keep down crackers and soup but has had decreased appetite.  On arrival, the patient was afebrile, hemodynamically stable, normal sinus rhythm noted on cardiac telemetry.  Concern for new/worsening abdominal wall hernia. Considered incarceration and strangulated hernia.  Additionally considered  SBO, gastroenteritis, PUD, pancreatitis. The patient is status post cholecystectomy.  The patient's laboratory work-up was significant for a lactic acid that was normal.  CBC did not reveal leukocytosis.  Lipase was normal.  His CMP was generally unremarkable with the exception of mildly elevated alkaline phosphatase to 208, normal LFTs, normal T. bili.  Urinalysis was negative for UTI.  The patient tolerated oral intake both fluids and solids in the emergency department with no further episodes of vomiting.  She is provided IV fentanyl for initial pain control and an IV Zofran for nausea.  She was fluid resuscitated with a normal saline bolus.  A lidocaine patch was applied along the patient's right abdomen.  No vesicular rash to suggest shingles.  No evidence for pancreatitis. CT abdomen pelvis was performed which revealed a prior ventral hernia repair with no abdominal pelvic ascites, no free air, lower abdominal ventral hernia containing fat and nonobstructed small bowel with a 3.8 cm aperture, and change from prior exam.  No acute abdominal pelvic abnormalities were noted with a normal appendix.  The patient has been appropriately medically screened and/or stabilized in the ED. I have low suspicion for any other emergent medical condition which would require further screening, evaluation or treatment in the ED or require inpatient management.  On repeat  assessment, the patient had tolerated oral intake successfully.  She was feeling symptomatically improved following the above interventions.  She has close follow-up with her PCP in 2 days.  Symptoms could be still consistent with gastroenteritis given her nausea, vomiting, diarrhea and mild abdominal discomfort.  Additionally considered PUD which would need to be further evaluated outpatient via gastroenterology.  Advised continued fluid hydration and follow-up outpatient.  Final Clinical Impression(s) / ED Diagnoses Final diagnoses:  Nausea vomiting and diarrhea  Generalized abdominal pain    Rx / DC Orders ED Discharge Orders          Ordered    ondansetron (ZOFRAN-ODT) 4 MG disintegrating tablet  Every 8 hours PRN        11/27/21 1314    oxyCODONE-acetaminophen (PERCOCET/ROXICET) 5-325 MG tablet  Every 6 hours PRN        11/27/21 1315              Regan Lemming, MD 11/27/21 1319

## 2021-11-27 NOTE — ED Triage Notes (Signed)
Pt BIB EMS from home c/o rt sided abdominal pain x 1 week. Reports n/v/d since Saturday and pain is steadily increasing. Possible "new" hernia, h/o hernia. AO x 4, able to keep crackers and soup down but not eating very well.  ? ?

## 2021-11-27 NOTE — Progress Notes (Signed)
Virtual Visit via Video Note ?  ?I connected with Pamella S. Humber on 11/27/21 at 2:00pm by video enabled telemedicine application and verified that I am speaking with the correct person using two identifiers. ?  ?I discussed the limitations, risks, security and privacy concerns of performing an evaluation and management service by video and the availability of in person appointments. I also discussed with the patient that there may be a patient responsible charge related to this service. The patient expressed understanding and agreed to proceed. ?  ?I discussed the assessment and treatment plan with the patient. The patient was provided an opportunity to ask questions and all were answered. The patient agreed with the plan and demonstrated an understanding of the instructions. ?  ?The patient was advised to call back or seek an in-person evaluation if the symptoms worsen or if the condition fails to improve as anticipated. ?  ?I provided 34 minutes of non-face-to-face time during this encounter. ?  ?  ?[name], LCSW, LCAS ?_______________________ ?THERAPIST PROGRESS NOTE ?  ?Session Time: 2:00pm - 2:34pm  ? ?Location: ?Patient: Patient home    ?Provider: [facility]  ?  ?Participation Level: Active  ?  ?Behavioral Response: Alert, casually dressed, euthymic mood/affect ?  ?Type of Therapy:  Individual Therapy ?  ?Treatment Goals addressed: Mood management; Medication management  ? ?Progress Towards Goals: Progressing ?  ?Interventions: CBT, pain assessment, communication skills  ?  ?Summary: Harriette Bouillon. Glaza is a 56 year old divorced African American female that presented for therapy appointment today with diagnoses of Major Depressive Disorder, recurrent, moderate, Generalized Anxiety Disorder and PTSD.    ?  ?Suicidal/Homicidal: None; without plan or intent  ?                                                                                                              ?Therapist Response:  Clinician met with Legacy  for virtual therapy appointment and assessed for safety, sobriety, and medication compliance.  Jamilee presented for session on time and spoke in a manner that was alert, oriented x5, with no evidence or self-report of active SI/HI or A/V H.  Averil reported that she continues taking medication as prescribed and denied any use of alcohol or illicit substances.  Clinician inquired about Minha's current emotional ratings, as well as any significant changes in thoughts, feelings, or behavior since previous check-in.  Lisa reported scores of 0/10 for depression, 0/10 for anxiety, and 0/10 for anger/irritability.  Lolitha also denied experiencing any recent panic attacks or outbursts.  Deseri reported that a recent struggle was visiting the ED today via ambulance due to abdomen pain that appeared one week ago, stating "I just thought it was gas at first, but I woke up this morning in a lot of pain".  Lucero reported that she was discharged with an appointment to see her GI doctor on Friday, stating "They gave me a pain patch and pill, and I feel a little better".  Clinician did a pain assessment with Ricketta, and  she reported score of 4/10 in severity.  Clinician offered to do a pain relief meditation with Ariannie to assist in addressing this, but she declined, reporting that she wanted to focus on interpersonal issues with her sister going on.  Clinician reviewed material with Chanita today on communication skills which could be utilized to increase understanding and support within the relationship.  Clinician reviewed a handout on 'soft startups' which offered suggestions on how Atavia could address a problem assertively with her sister, including tips such as choosing an appropriate time/setting, being mindful of maintaining gentle tone, volume and language, while avoiding triggering nonverbals such as rolling eyes, as well as utilizing ?I? statements to express feelings, focusing on one problem at a time, and being respectful.   Intervention was effective, as evidenced by [name] actively engaging in discussion on subject of communication skills, and expressing interest in using these strategies to resolve ongoing issues with her sister contributing to compartmentalization of feelings and affecting mood, stating "We don't raise our voices anymore, but neither of [name] have a good poker face so that is one thing I need to watch out for.  Our communication has gotten better, but we do still have work to do".  Chandelle reported that she would plan to write a rough draft letter in week ahead before she has this conversation with her sister.  Clinician will continue to monitor.   ?                                   ?Plan: Follow up again in 1 week.  ?  ?Diagnosis: Major depressive disorder, recurrent, moderate; Generalized Anxiety Disorder; and PTSD  ? ?Collaboration of Care:   No collaboration of care required for this visit. ?                                                  ?Patient/Guardian was advised Release of Information must be obtained prior to any record release in order to collaborate their care with an outside provider. Patient/Guardian was advised if they have not already done so to contact the registration department to sign all necessary forms in order for [name] to release information regarding their care.  ?  ?Consent: Patient/Guardian gives verbal consent for treatment and assignment of benefits for services provided during this visit. Patient/Guardian expressed understanding and agreed to proceed. ? ?[name], LCSW, LCAS ?11/27/21 ?

## 2021-11-27 NOTE — ED Notes (Signed)
Sandwich and drink provided to pt for PO challenge.  ?

## 2021-11-27 NOTE — Discharge Instructions (Signed)
You were evaluated in the Emergency Department and after careful evaluation, we did not find any emergent condition requiring admission or further testing in the hospital. ? ?Your exam/testing today was overall reassuring.  Your CT scan did not show any acute abnormalities and your laboratory work-up was reassuring.  Recommend you follow-up outpatient with your PCP for reassessment with the potential for further follow-up with your gastroenterologist. ? ?Please return to the Emergency Department if you experience any worsening of your condition.  Thank you for allowing [name] to be a part of your care. ? ?

## 2021-12-04 ENCOUNTER — Ambulatory Visit (INDEPENDENT_AMBULATORY_CARE_PROVIDER_SITE_OTHER): Payer: Medicare Other | Admitting: Licensed Clinical Social Worker

## 2021-12-04 ENCOUNTER — Other Ambulatory Visit: Payer: Self-pay

## 2021-12-04 DIAGNOSIS — F331 Major depressive disorder, recurrent, moderate: Secondary | ICD-10-CM

## 2021-12-04 DIAGNOSIS — F411 Generalized anxiety disorder: Secondary | ICD-10-CM | POA: Diagnosis not present

## 2021-12-04 DIAGNOSIS — F4310 Post-traumatic stress disorder, unspecified: Secondary | ICD-10-CM | POA: Diagnosis not present

## 2021-12-04 NOTE — Progress Notes (Signed)
Virtual Visit via Video Note ?  ?I connected with Lucretia S. Lechuga on 12/04/21 at 1:00pm by video enabled telemedicine application and verified that I am speaking with the correct person using two identifiers. ?  ?I discussed the limitations, risks, security and privacy concerns of performing an evaluation and management service by video and the availability of in person appointments. I also discussed with the patient that there may be a patient responsible charge related to this service. The patient expressed understanding and agreed to proceed. ?  ?I discussed the assessment and treatment plan with the patient. The patient was provided an opportunity to ask questions and all were answered. The patient agreed with the plan and demonstrated an understanding of the instructions. ?  ?The patient was advised to call back or seek an in-person evaluation if the symptoms worsen or if the condition fails to improve as anticipated. ?  ?I provided 53 minutes of non-face-to-face time during this encounter. ?  ?  ?[name], LCSW, LCAS ?_______________________ ?THERAPIST PROGRESS NOTE ?  ?Session Time: 1:00pm - 1:53pm   ? ?Location: ?Patient: Patient home    ?Provider: [facility]  ?  ?Participation Level: Active  ?  ?Behavioral Response: Alert, casually dressed, euthymic mood/affect ?  ?Type of Therapy:  Individual Therapy ?  ?Treatment Goals addressed: Mood management; Medication management  ? ?Progress Towards Goals: Progressing ?  ?Interventions: CBT, soothing grounding techniques   ?  ?Summary: Harriette Bouillon. Rayson is a 56 year old divorced African American female that presented for therapy appointment today with diagnoses of Major Depressive Disorder, recurrent, moderate, Generalized Anxiety Disorder and PTSD.    ?  ?Suicidal/Homicidal: None; without plan or intent  ?                                                                                                              ?Therapist Response:  Clinician met with Lilia for  virtual therapy session and assessed for safety, sobriety, and medication compliance.  Everleigh presented for appointment on time and spoke in a manner that was alert, oriented x5, with no evidence or self-report of active SI/HI or A/V H.  Coralie reported ongoing compliance with medication and denied any use of alcohol or illicit substances.  Clinician inquired about Teale's emotional ratings today, as well as any significant changes in thoughts, feelings, or behavior since last check-in.  Toinette reported scores of 0/10 for depression, 0/10 for anxiety, and 0/10 for anger/irritability.  Cait also denied experiencing any recent panic attacks or outbursts.  Tapanga reported that she is in a good mood today, and stayed very busy over the weekend with dance rehearsal, helping with an [place] play, and travelling to [place] to help with a separate event.  Colby reported that one struggle is trying to avoid ruminating on her sister going on an upcoming trip his weekend, stating "I want to be happy that she is going on vacation, but I also don't want her to go because I know I'll miss her".  Clinician suggested  discussion on grounding techniques today with Deitra based upon her ruminations on sister heading out of town.  Clinician explained how grounding techniques can be utilized to temporarily distract from distressing thoughts, or feelings, and covered category of self-soothing grounding techniques with her today, including examples for practice such as saying kind or coping statements, thinking of favorite things, picturing people she cares about, recalling a positive poem, song, or quotation, planning a safe treat, or something to look forward to soon such as catching up with her sister upon return.  Intervention was effective, as evidenced by [name] engaging in discussion on the subject and actively trying some of these techniques out in session, noting that they could be helpful if she practices them regularly, stating "Its  only a week so I think Ill be alright.  I've got things I can do to keep me busy".  Clinician will continue to monitor.   ?                                   ?Plan: Follow up again in 1 week.  ?  ?Diagnosis: Major depressive disorder, recurrent, moderate; Generalized Anxiety Disorder; and PTSD  ? ?Collaboration of Care:   No collaboration of care required for this visit. ?                                                  ?Patient/Guardian was advised Release of Information must be obtained prior to any record release in order to collaborate their care with an outside provider. Patient/Guardian was advised if they have not already done so to contact the registration department to sign all necessary forms in order for [name] to release information regarding their care.  ?  ?Consent: Patient/Guardian gives verbal consent for treatment and assignment of benefits for services provided during this visit. Patient/Guardian expressed understanding and agreed to proceed. ? ?[name], LCSW, LCAS ?12/04/21 ? ?

## 2021-12-05 ENCOUNTER — Emergency Department (HOSPITAL_COMMUNITY)
Admission: EM | Admit: 2021-12-05 | Discharge: 2021-12-05 | Disposition: A | Discharge status: Home / Self Care | Payer: Medicare Other | Attending: Emergency Medicine | Admitting: Emergency Medicine

## 2021-12-05 ENCOUNTER — Other Ambulatory Visit: Payer: Self-pay

## 2021-12-05 ENCOUNTER — Emergency Department (HOSPITAL_COMMUNITY): Payer: Medicare Other

## 2021-12-05 DIAGNOSIS — R0789 Other chest pain: Secondary | ICD-10-CM | POA: Diagnosis not present

## 2021-12-05 DIAGNOSIS — M25512 Pain in left shoulder: Secondary | ICD-10-CM | POA: Diagnosis not present

## 2021-12-05 DIAGNOSIS — Z7901 Long term (current) use of anticoagulants: Secondary | ICD-10-CM | POA: Insufficient documentation

## 2021-12-05 DIAGNOSIS — I4891 Unspecified atrial fibrillation: Secondary | ICD-10-CM | POA: Insufficient documentation

## 2021-12-05 DIAGNOSIS — R519 Headache, unspecified: Secondary | ICD-10-CM | POA: Diagnosis not present

## 2021-12-05 DIAGNOSIS — R079 Chest pain, unspecified: Secondary | ICD-10-CM | POA: Diagnosis present

## 2021-12-05 LAB — BASIC METABOLIC PANEL
Anion gap: 7 (ref 5–15)
BUN: 14 mg/dL (ref 6–20)
CO2: 29 mmol/L (ref 22–32)
Calcium: 9.8 mg/dL (ref 8.9–10.3)
Chloride: 103 mmol/L (ref 98–111)
Creatinine, Ser: 0.84 mg/dL (ref 0.44–1.00)
GFR, Estimated: >60 mL/min (ref >60)
Glucose, Bld: 96 mg/dL (ref 70–99)
Potassium: 4.1 mmol/L (ref 3.5–5.1)
Sodium: 139 mmol/L (ref 135–145)

## 2021-12-05 LAB — TROPONIN I (HIGH SENSITIVITY)
Troponin I (High Sensitivity): 3 ng/L (ref <18)
Troponin I (High Sensitivity): 5 ng/L (ref <18)

## 2021-12-05 LAB — CBC
HCT: 34.6 % — ABNORMAL LOW (ref 36.0–46.0)
Hemoglobin: 11 g/dL — ABNORMAL LOW (ref 12.0–15.0)
MCH: 27 pg (ref 26.0–34.0)
MCHC: 31.8 g/dL (ref 30.0–36.0)
MCV: 84.8 fL (ref 80.0–100.0)
Platelets: 271 10*3/uL (ref 150–400)
RBC: 4.08 MIL/uL (ref 3.87–5.11)
RDW: 14.2 % (ref 11.5–15.5)
WBC: 5.6 10*3/uL (ref 4.0–10.5)
nRBC: 0 % (ref 0.0–0.2)

## 2021-12-05 LAB — I-STAT BETA HCG BLOOD, ED (MC, WL, AP ONLY): I-stat hCG, quantitative: 5.9 m[IU]/mL — ABNORMAL HIGH (ref <5)

## 2021-12-05 MED ORDER — ACETAMINOPHEN 325 MG PO TABS
650.0000 mg | ORAL_TABLET | Freq: Once | ORAL | Status: AC
  Administered 2021-12-05: 650 mg via ORAL
  Filled 2021-12-05: qty 2

## 2021-12-05 MED ORDER — OXYCODONE HCL 5 MG PO TABS
5.0000 mg | ORAL_TABLET | Freq: Once | ORAL | Status: AC
  Administered 2021-12-05: 5 mg via ORAL
  Filled 2021-12-05: qty 1

## 2021-12-05 NOTE — ED Provider Triage Note (Signed)
Emergency Medicine Provider Triage Evaluation Note ? ?Meghan [name] , a 56 y.o. female  was evaluated in triage.  Pt complains of chest pain, left shoulder pain and headache that started suddenly this morning.  Localizes the headache to the middle of her forehead.  Says that the chest pain is sharp.  Has a history of reflux however says that that is burning and this time her pain is very sharp.  No history of ACS.  Says that this happened in the past and she was hospitalized for 21 days however they never told her what was wrong with her. ? ?Review of Systems  ?Positive: Chest pain, headache, left shoulder pain ?Negative: Dizziness ? ?Physical Exam  ?BP (!) 111/47 (BP Location: Left Arm)   Pulse 65   Temp 98.5 ?F (36.9 ?C) (Oral)   Resp 20   SpO2 100%  ?Gen:   Awake, no distress   ?Resp:  Normal effort  ?MSK:   Moves extremities without difficulty  ?Other:  RRR, lungs clear ? ?Medical Decision Making  ?Medically screening exam initiated at 10:27 AM.  Appropriate orders placed.  MAJA MCCAFFERY was informed that the remainder of the evaluation will be completed by another provider, this initial triage assessment does not replace that evaluation, and the importance of remaining in the ED until their evaluation is complete. ? ? ? ?Chest pain work-up initiated ?  ?Rhae Hammock, PA-C ?12/05/21 1029 ? ?

## 2021-12-05 NOTE — ED Provider Notes (Signed)
?[facility] ?Provider Note ? ? ?CSN: [id number] ?Arrival date & time: 12/05/21  1008 ? ?  ? ?History ? ?Chief Complaint  ?Patient presents with  ? Chest Pain  ? Headache  ? Shoulder Pain  ? ? ?Meghan [name] is a 56 y.o. female with a past medical history of atrial fibrillation, PE s/p IVC placement 2003, prediabetes and obesity presenting with chest pain.  She reports that she was at the grocery store and when she got in the car she all of a sudden had severe chest pain on the left side of her chest and pain in her left shoulder.  Reports she has chronic pain in her left shoulder and she had surgery on it last month.  Denies shortness of breath or dizziness in the moment however she called her primary care provider who said that she needed to go to the emergency department.  No history of ACS.  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  Also complaining of a headache to the left forehead.  No visual disturbance. ? ? ?Home Medications ?Prior to Admission medications   ?Medication Sig Start Date End Date Taking? Authorizing Provider  ?acetaminophen (TYLENOL) 500 MG tablet Take 1,000 mg by mouth every 6 (six) hours as needed for moderate pain.    [provider]  ?albuterol (VENTOLIN HFA) 108 (90 Base) MCG/ACT inhaler Inhale 2 puffs into the lungs every 6 (six) hours as needed for wheezing or shortness of breath.    [provider]  ?atorvastatin (LIPITOR) 40 MG tablet Take 40 mg by mouth daily. 07/26/21   [provider]  ?Judithann Sauger AEROSPHERE 160-9-4.8 MCG/ACT AERO Inhale 2 puffs into the lungs in the morning and at bedtime.  03/24/20   [provider]  ?Cholecalciferol (VITAMIN D-3) 25 MCG (1000 UT) CAPS Take 1,000 Units by mouth daily.    [provider]  ?flecainide (TAMBOCOR) 50 MG tablet Take 50 mg by mouth 2 (two) times daily.  09/04/19   [provider]  ?furosemide (LASIX) 40 MG tablet Take 40 mg by mouth daily with breakfast.      [provider]  ?ketoconazole (NIZORAL) 2 % cream Apply to bottoms of feet daily for 4 weeks. 11/13/21   Bronson Ing, DPM  ?levETIRAcetam (KEPPRA) 500 MG tablet Take 1 tablet (500 mg total) by mouth 2 (two) times daily. 07/04/21   Marcial Pacas, MD  ?LORazepam (ATIVAN) 1 MG tablet Take 2 tablets prior to leaving house on day of office surgery.  Bring third tablet with you to the office on the day of office surgery. ?Patient not taking: Reported on 09/11/2021 08/24/21   Waynetta Sandy, MD  ?meclizine (ANTIVERT) 25 MG tablet Take 1 tablet (25 mg total) by mouth 3 (three) times daily as needed for dizziness. ?Patient not taking: Reported on 09/06/2021 01/11/21   Domenic Moras, PA-C  ?metFORMIN (GLUCOPHAGE) 500 MG tablet Take 500 mg by mouth daily with breakfast.    [provider]  ?methocarbamol (ROBAXIN) 500 MG tablet Take 1 tablet (500 mg total) by mouth 2 (two) times daily. 12/14/20   Tedd Sias, PA  ?metoprolol succinate (TOPROL-XL) 25 MG 24 hr tablet Take 25 mg by mouth daily. 05/02/21   [provider]  ?Multiple Vitamins-Minerals (PRESERVISION AREDS 2 PO) Take 2 capsules by mouth daily with breakfast.     [provider]  ?Omega-3 Fatty Acids (FISH OIL) 1000 MG CAPS Take 1,000 mg by mouth  daily with breakfast.     [provider]  ?omeprazole (PRILOSEC) 40 MG capsule Take 40 mg by mouth daily. 08/17/20   [provider]  ?ondansetron (ZOFRAN-ODT) 4 MG disintegrating tablet Take 1 tablet (4 mg total) by mouth every 8 (eight) hours as needed for nausea or vomiting. 11/27/21   Regan Lemming, MD  ?oxyCODONE-acetaminophen (PERCOCET/ROXICET) 5-325 MG tablet Take 1 tablet by mouth every 6 (six) hours as needed for severe pain. 11/27/21   Regan Lemming, MD  ?rivaroxaban (XARELTO) 20 MG TABS tablet Take 20 mg by mouth at bedtime.     [provider]  ?RYBELSUS 14 MG TABS Take 14 mg by mouth daily. 07/27/21   [provider]   ?sertraline (ZOLOFT) 50 MG tablet Take 50 mg by mouth daily. 01/01/21   [provider]  ?   ? ?Allergies    ?Acetaminophen-codeine, Other, Tramadol, Codeine, Coconut oil, Meloxicam, and Tomato   ? ?Review of Systems   ?Review of Systems  ?Cardiovascular:  Positive for chest pain.  ?Neurological:  Positive for headaches.  ?See HPI ? ?Physical Exam ?Updated Vital Signs ?BP 105/77 (BP Location: Left Arm)   Pulse 64   Temp 97.7 ?F (36.5 ?C)   Resp 16   SpO2 100%  ?Physical Exam ?Vitals and nursing note reviewed.  ?Constitutional:   ?   General: She is not in acute distress. ?   Appearance: Normal appearance. She is not ill-appearing.  ?HENT:  ?   Head: Normocephalic and atraumatic.  ?Eyes:  ?   General: No scleral icterus. ?   Conjunctiva/sclera: Conjunctivae normal.  ?Cardiovascular:  ?   Rate and Rhythm: Normal rate and regular rhythm.  ?Pulmonary:  ?   Effort: Pulmonary effort is normal. No respiratory distress.  ?   Breath sounds: Normal breath sounds.  ?Chest:  ?   Chest wall: Tenderness (Left anterior chest ribs 3 through 4) present.  ?Skin: ?   Findings: No rash.  ?Neurological:  ?   General: No focal deficit present.  ?   Mental Status: She is alert.  ?   Cranial Nerves: No cranial nerve deficit.  ?   Motor: No weakness.  ?   Comments: Moving all extremities.  5 out of 5 strength in all 4 extremities.  Finger-nose and heel shin without abnormalities.  EOMs intact.  Cranial nerves II through XII intact.  Sensation intact proximally and distally  ?Psychiatric:     ?   Mood and Affect: Mood normal.     ?   Behavior: Behavior normal.  ? ? ?ED Results / Procedures / Treatments   ?Labs ?(all labs ordered are listed, but only abnormal results are displayed) ?Labs Reviewed  ?CBC - Abnormal; Notable for the following components:  ?    Result Value  ? Hemoglobin 11.0 (*)   ? HCT 34.6 (*)   ? All other components within normal limits  ?I-STAT BETA HCG BLOOD, ED ([facility], [facility], [facility] ONLY) - Abnormal; Notable for the  following components:  ? I-stat hCG, quantitative 5.9 (*)   ? All other components within normal limits  ?BASIC METABOLIC PANEL  ?TROPONIN I (HIGH SENSITIVITY)  ?TROPONIN I (HIGH SENSITIVITY)  ? ? ?EKG ?None ? ?Radiology ?DG Chest 2 View ? ?Result Date: 12/05/2021 ?CLINICAL DATA:  Chest pain. EXAM: CHEST - 2 VIEW COMPARISON:  09/11/2021. FINDINGS: No consolidation. No visible pleural effusions or pneumothorax. Borderline enlargement of the cardiac silhouette, similar. Degenerative changes of the spine. IMPRESSION: No  evidence of acute cardiopulmonary disease. Electronically Signed   By: Margaretha Sheffield M.D.   On: 12/05/2021 12:12   ? ?Procedures ?Procedures  ?Normal sinus rhythm with a normal rate on monitor while at bedside ?Medications Ordered in ED ?Medications  ?acetaminophen (TYLENOL) tablet 650 mg (650 mg Oral Given 12/05/21 1044)  ?oxyCODONE (Oxy IR/ROXICODONE) immediate release tablet 5 mg (5 mg Oral Given 12/05/21 1600)  ? ? ?ED Course/ Medical Decision Making/ A&P ?  ?                        ?Medical Decision Making ?Amount and/or Complexity of Data Reviewed ?Labs: ordered. ?Radiology: ordered. ? ?Risk ?OTC drugs. ?Prescription drug management. ? ? ?Patient presents to the ED for concern of chest pain. The emergent differential diagnosis of chest pain includes: Acute coronary syndrome, pericarditis, aortic dissection, pulmonary embolism, tension pneumothorax, and esophageal rupture. ? ?  ?Co morbidities that complicate the patient evaluation include: A-fib ? ?Per internal/external chart review: Patient has no history of ACS.  She has a cardiologist whom she follows for her A-fib. ? ? ?I performed a full physical exam, pertinent findings include: ?N/A ? ?Diagnostics: ? ?I ordered and viewed labs. The pertinent results include:  ?Negative troponin x2 ? ?I ordered and individually viewed patient's chest x-ray.  This was negative. ? ? ?Cardiac Monitoring: ? ?The patient was maintained on a cardiac monitor.  I  personally viewed and interpreted the cardiac monitored which showed normal sinus rhythm. ? ?Treatment: ? ?I ordered Tylenol for patient's headache in triage.  When she was in her room she said this did not help he

## 2021-12-05 NOTE — ED Triage Notes (Signed)
Pt. Stated, I started having chest pain, head pain and shoulder pain about a hour ago at the store. ?

## 2021-12-05 NOTE — Discharge Instructions (Signed)
Please return with any worsening or recurring symptoms.  Also, please make an appointment with your cardiologist to discuss today's episode and see if they believe we need further evaluation. ? ?You may talk to your primary care provider if you continue to have headaches or have a concern for migraines. ?

## 2021-12-05 NOTE — ED Notes (Signed)
All discharge instructions including follow up care reviewed with patient and patient verbalized understanding of same. Patient stable at time of discharge and was discharged to the front lobby to await ride from [facility] services.  ?

## 2021-12-06 ENCOUNTER — Other Ambulatory Visit: Payer: Self-pay

## 2021-12-06 ENCOUNTER — Ambulatory Visit (INDEPENDENT_AMBULATORY_CARE_PROVIDER_SITE_OTHER): Payer: Medicare Other | Admitting: Podiatry

## 2021-12-06 ENCOUNTER — Ambulatory Visit: Payer: Medicare Other

## 2021-12-06 DIAGNOSIS — M21611 Bunion of right foot: Secondary | ICD-10-CM

## 2021-12-06 DIAGNOSIS — M2041 Other hammer toe(s) (acquired), right foot: Secondary | ICD-10-CM

## 2021-12-06 DIAGNOSIS — Z01818 Encounter for other preprocedural examination: Secondary | ICD-10-CM

## 2021-12-06 DIAGNOSIS — M19071 Primary osteoarthritis, right ankle and foot: Secondary | ICD-10-CM

## 2021-12-06 DIAGNOSIS — E1142 Type 2 diabetes mellitus with diabetic polyneuropathy: Secondary | ICD-10-CM

## 2021-12-06 NOTE — Progress Notes (Signed)
SITUATION ?Reason for Consult: Evaluation for Prefabricated Diabetic Shoes and Custom Diabetic Inserts. ?Patient / Caregiver Report: Patient would like well fitting shoes ? ?OBJECTIVE DATA: ?Patient History / Diagnosis:  ?  ICD-10-CM   ?1. Diabetic peripheral neuropathy associated with type 2 diabetes mellitus (HCC)  E11.42   ?  ?2. Bunion, right foot  M21.611   ?  ?3. Hammertoe of second toe of right foot  M20.41   ?  ? ?Physician Treating Diabetes:  [facility] physician over [name] FNP ? ?Current or Previous Devices:   None and no history ? ?In-Person Foot Examination: ?Ulcers & Callousing:   None ?Deformities:    Bunion, hammertoes ?Sensation:    Compromised  ?Shoe Size:     11XW ? ?ORTHOTIC RECOMMENDATION ?Recommended Devices: ?- 1x pair prefabricated PDAC approved diabetic shoes; Patient Selected Shon [employer]let Blue 843 Size 11XW ?- 3x pair custom-to-patient PDAC approved vacuum formed diabetic insoles. ? ?GOALS OF SHOES AND INSOLES ?- Reduce shear and pressure ?- Reduce / Prevent callus formation ?- Reduce / Prevent ulceration ?- Protect the fragile healing compromised diabetic foot. ? ?Patient would benefit from diabetic shoes and inserts as patient has diabetes mellitus and the patient has one or more of the following conditions: ?- History of partial or complete amputation of the foot ?- History of previous foot ulceration. ?- History of pre-ulcerative callus ?- Peripheral neuropathy with evidence of callus formation ?- Foot deformity ?- Poor circulation ? ?ACTIONS PERFORMED ?Potential out of pocket cost was communicated to patient. Patient understood and consented to measurement and casting. Patient was casted for insoles via crush box and measured for shoes via brannock device. Procedure was explained and patient tolerated procedure well. All questions were answered and concerns addressed. Casts were shipped to central fabrication for HOLD until Certificate of Medical Necessity or  otherwise necessary authorization from insurance is obtained. ? ?PLAN ?Shoes are to be ordered and casts released from hold once all appropriate paperwork is complete. Patient is to be contacted and scheduled for fitting once shoes and insoles have been fabricated and received. ? ?

## 2021-12-06 NOTE — Progress Notes (Signed)
?Subjective:  ?Patient ID: Meghan [name], female    DOB: [birth year],  MRN: [id number] ? ?Chief Complaint  ?Patient presents with  ? Bunions  ?  Right foot bunion   ? ? ?56 y.o. female presents with the above complaint.  Patient presents with complaint of right first MPJ pain.  Patient states has progressive gotten worse is painful with range of motion of the joint with ambulation.  Patient states been on for quite some time she has been to a podiatrist in [place] which has tried all conservative treatment options including padding protecting shoe gear modification steroid injection none of which has helped.  She would like to address the bunion deformity at this time.  She denies any other acute complaints pain scale is 8 out of 10 hurts with ambulation. ? ? ?Review of Systems: Negative except as noted in the HPI. Denies N/V/F/Ch. ? ?Past Medical History:  ?Diagnosis Date  ? Arthritis   ? left knee  ? Asthma   ? Atrial fibrillation ([place])   ? Atrial fibrillation ([place]) 02/16/2019  ? CHF (congestive heart failure) ([place]) 01/2019  ? Physician from [place] informed her she had this   ? Depression   ? Dysrhythmia   ? Atrial fibrillation  ? GERD (gastroesophageal reflux disease)   ? Hiatal hernia   ? History of pulmonary embolus ([facility])   ? Hypercholesterolemia   ? Macular degeneration   ? of left eye  ? Osteoarthritis   ? knees  ? Pneumonia   ? in the past  ? Pre-diabetes   ? Pulmonary embolism ([place])   ? bilat  ? Seizure ([place])   ? Seizure disorder ([place]) 2017  ? Sleep apnea CPAP  ? [employer]  ? Stroke [facility])   ? 1989 and 1995 (left sided weakness)  ? Vitamin D deficiency   ? ? ?Current Outpatient Medications:  ?  acetaminophen (TYLENOL) 500 MG tablet, Take 1,000 mg by mouth every 6 (six) hours as needed for moderate pain., Disp: , Rfl:  ?  albuterol (VENTOLIN HFA) 108 (90 Base) MCG/ACT inhaler, Inhale 2 puffs into the lungs every 6 (six) hours as needed for wheezing or shortness of breath., Disp: , Rfl:  ?  atorvastatin (LIPITOR) 40 MG  tablet, Take 40 mg by mouth daily., Disp: , Rfl:  ?  BREZTRI AEROSPHERE 160-9-4.8 MCG/ACT AERO, Inhale 2 puffs into the lungs in the morning and at bedtime. , Disp: , Rfl:  ?  Cholecalciferol (VITAMIN D-3) 25 MCG (1000 UT) CAPS, Take 1,000 Units by mouth daily., Disp: , Rfl:  ?  flecainide (TAMBOCOR) 50 MG tablet, Take 50 mg by mouth 2 (two) times daily. , Disp: , Rfl:  ?  furosemide (LASIX) 40 MG tablet, Take 40 mg by mouth daily with breakfast. , Disp: , Rfl:  ?  ketoconazole (NIZORAL) 2 % cream, Apply to bottoms of feet daily for 4 weeks., Disp: 60 g, Rfl: 4 ?  levETIRAcetam (KEPPRA) 500 MG tablet, Take 1 tablet (500 mg total) by mouth 2 (two) times daily., Disp: 180 tablet, Rfl: 3 ?  LORazepam (ATIVAN) 1 MG tablet, Take 2 tablets prior to leaving house on day of office surgery.  Bring third tablet with you to the office on the day of office surgery. (Patient not taking: Reported on 09/11/2021), Disp: 2 tablet, Rfl: 0 ?  meclizine (ANTIVERT) 25 MG tablet, Take 1 tablet (25 mg total) by mouth 3 (three) times daily as needed for dizziness. (Patient not taking: Reported on  09/06/2021), Disp: 30 tablet, Rfl: 0 ?  metFORMIN (GLUCOPHAGE) 500 MG tablet, Take 500 mg by mouth daily with breakfast., Disp: , Rfl:  ?  methocarbamol (ROBAXIN) 500 MG tablet, Take 1 tablet (500 mg total) by mouth 2 (two) times daily., Disp: 20 tablet, Rfl: 0 ?  metoprolol succinate (TOPROL-XL) 25 MG 24 hr tablet, Take 25 mg by mouth daily., Disp: , Rfl:  ?  Multiple Vitamins-Minerals (PRESERVISION AREDS 2 PO), Take 2 capsules by mouth daily with breakfast. , Disp: , Rfl:  ?  Omega-3 Fatty Acids (FISH OIL) 1000 MG CAPS, Take 1,000 mg by mouth daily with breakfast. , Disp: , Rfl:  ?  omeprazole (PRILOSEC) 40 MG capsule, Take 40 mg by mouth daily., Disp: , Rfl:  ?  ondansetron (ZOFRAN-ODT) 4 MG disintegrating tablet, Take 1 tablet (4 mg total) by mouth every 8 (eight) hours as needed for nausea or vomiting., Disp: 20 tablet, Rfl: 0 ?   oxyCODONE-acetaminophen (PERCOCET/ROXICET) 5-325 MG tablet, Take 1 tablet by mouth every 6 (six) hours as needed for severe pain., Disp: 15 tablet, Rfl: 0 ?  rivaroxaban (XARELTO) 20 MG TABS tablet, Take 20 mg by mouth at bedtime. , Disp: , Rfl:  ?  RYBELSUS 14 MG TABS, Take 14 mg by mouth daily., Disp: , Rfl:  ?  sertraline (ZOLOFT) 50 MG tablet, Take 50 mg by mouth daily., Disp: , Rfl:  ? ?Social History  ? ?Tobacco Use  ?Smoking Status Never  ?Smokeless Tobacco Never  ? ? ?Allergies  ?Allergen Reactions  ? Acetaminophen-Codeine Swelling, Rash and Other (See Comments)  ?  Tylenol with Codeine, Tylenol #3,  ?(facial swelling, hives)  ? Other Hives and Other (See Comments)  ?  Muscle relaxer that starts with a "T" caused hives (not tizanidine) January or February 2020- made the patient VERY sick  ? Tramadol Hives and Other (See Comments)  ?  Patient stated "I was trippin' and I do not want that ever again"  ? Codeine Swelling  ?  hives  ? Coconut Oil Rash and Other (See Comments)  ?  ANY coconut products   ? Meloxicam Rash  ? Tomato Rash  ? ?Objective:  ?There were no vitals filed for this visit. ?There is no height or weight on file to calculate BMI. ?Constitutional Well developed. ?Well nourished.  ?Vascular Dorsalis pedis pulses palpable bilaterally. ?Posterior tibial pulses palpable bilaterally. ?Capillary refill normal to all digits.  ?No cyanosis or clubbing noted. ?Pedal hair growth normal.  ?Neurologic Normal speech. ?Oriented to person, place, and time. ?Epicritic sensation to light touch grossly present bilaterally.  ?Dermatologic Nails well groomed and normal in appearance. ?No open wounds. ?No skin lesions.  ?Orthopedic: Pain on palpation of right first metatarsophalangeal joint.  Pain with range of motion of the first metatarsophalangeal joint.  Bunion deformity noted that is track bound not a tracking deformity.  Underlying crepitus clinically appreciated.  No pain at the sesamoidal complex.   ? ?Radiographs: 3 views of skeletally mature the right foot: Severe bunion deformity noted with underlying osteoarthritic changes noted to the first metatarsophalangeal joint.  Osteophytes as well as exostosis noted to the joint. ?Assessment:  ? ?1. Bunion, right foot   ?2. Arthritis of first metatarsophalangeal (MTP) joint of right foot   ?3. Encounter for preoperative examination for general surgical procedure   ? ?Plan:  ?Patient was evaluated and treated and all questions answered. ? ?Right first metatarsophalangeal joint arthritis with underlying bunion deformity ?-All questions and concerns were  discussed with the patient in extensive detail ?-Given that patient has clinically tried all conservative treatment options including padding shoe gear modification offloading I believe she will benefit from surgical fusion of the first metatarsophalangeal joint.  I discussed this with the patient in extensive detail she states understanding.  I discussed my preoperative intraoperative postoperative plan in extensive detail.  She would like to proceed with a left and first MPJ fusion she will also be nonweightbearing to the right lower extremity for 3 weeks ?-Informed surgical risk consent was reviewed and read aloud to the patient.  I reviewed the films.  I have discussed my findings with the patient in great detail.  I have discussed all risks including but not limited to infection, stiffness, scarring, limp, disability, deformity, damage to blood vessels and nerves, numbness, poor healing, need for braces, arthritis, chronic pain, amputation, death.  All benefits and realistic expectations discussed in great detail.  I have made no promises as to the outcome.  I have provided realistic expectations.  I have offered the patient a 2nd opinion, which they have declined and assured me they preferred to proceed despite the risks ? ? ?No follow-ups on file.  ?

## 2021-12-08 ENCOUNTER — Other Ambulatory Visit: Payer: Self-pay | Admitting: Orthopedic Surgery

## 2021-12-11 ENCOUNTER — Ambulatory Visit (INDEPENDENT_AMBULATORY_CARE_PROVIDER_SITE_OTHER): Payer: Medicare Other | Admitting: Licensed Clinical Social Worker

## 2021-12-11 ENCOUNTER — Other Ambulatory Visit: Payer: Self-pay

## 2021-12-11 DIAGNOSIS — F331 Major depressive disorder, recurrent, moderate: Secondary | ICD-10-CM

## 2021-12-11 DIAGNOSIS — F4310 Post-traumatic stress disorder, unspecified: Secondary | ICD-10-CM | POA: Diagnosis not present

## 2021-12-11 DIAGNOSIS — F411 Generalized anxiety disorder: Secondary | ICD-10-CM

## 2021-12-11 NOTE — Progress Notes (Signed)
Virtual Visit via Video Note ?  ?I connected with Meghan [name] on 12/11/21 at 2:00pm by video enabled telemedicine application and verified that I am speaking with the correct person using two identifiers. ?  ?I discussed the limitations, risks, security and privacy concerns of performing an evaluation and management service by video and the availability of in person appointments. I also discussed with the patient that there may be a patient responsible charge related to this service. The patient expressed understanding and agreed to proceed. ?  ?I discussed the assessment and treatment plan with the patient. The patient was provided an opportunity to ask questions and all were answered. The patient agreed with the plan and demonstrated an understanding of the instructions. ?  ?The patient was advised to call back or seek an in-person evaluation if the symptoms worsen or if the condition fails to improve as anticipated. ?  ?I provided 50 minutes of non-face-to-face time during this encounter. ?  ?  ?[name], LCSW, LCAS ?_______________________ ?THERAPIST PROGRESS NOTE ?  ?Session Time: 2:00pm - 2:50pm   ? ?Location: ?Patient: Patient home    ?Provider: [facility] [place] [facility]  ?  ?Participation Level: Active  ?  ?Behavioral Response: Alert, casually dressed, irritable mood/affect ?  ?Type of Therapy:  Individual Therapy ?  ?Treatment Goals addressed: Mood management; Maintaining healthy boundaries; Medication management  ? ?Progress Towards Goals: Progressing ?  ?Interventions: CBT, communication skills     ? ?Summary: Meghan [name] is a 56 year old divorced African American female that presented for therapy appointment today with diagnoses of Major Depressive Disorder, recurrent, moderate, Generalized Anxiety Disorder and PTSD.    ?  ?Suicidal/Homicidal: None; without plan or intent  ?                                                                                                              ?Therapist Response:   Clinician met with Meghan [name] for virtual therapy appointment and assessed for safety, sobriety, and medication compliance.  Meghan [name] presented for session on time and spoke in a manner that was alert, oriented x5, with no evidence or self-report of active SI/HI or A/V H.  Meghan [name] reported that she continues taking medication as prescribed and denied any use of alcohol or illicit substances.  Clinician inquired about Meghan [name]'s current emotional ratings, as well as any significant changes in thoughts, feelings, or behavior since previous check-in.  Meghan [name] reported scores of 0/10 for depression, 0/10 for anxiety, and 10/10 for anger/irritability.  Meghan [name] also denied experiencing any recent panic attacks.  Meghan [name] reported that her current struggle influencing anger/irritability is dealing with her daughter, who has not been raising her granddaughter appropriately, leading to school staff's involvement.  Meghan [name] reported that this triggered "A terrible outburst this morning" when she learned that CPS could become involved next.  Clinician discussed topic of communication skills with Meghan [name] today to assist.  Clinician utilized a handout with Meghan [name] that detailed various communication 'traps' that she might experience when interacting with family members like her  daughter, and defined each one, including common examples such as criticism, defensiveness, contempt, stonewalling, overgeneralizing, arguing, and more.  Clinician inquired about which ones Meghan [name] has engaged in, or seen demonstrated by her supports, and discussed strategies for assertively addressing each one successfully to strengthen relationships and communication skills.  Intervention was effective, as evidenced by Meghan [name] actively engaging in discussion on the subject, and reporting that this highlighted several 'traps' she could be more mindful of when expressing frustration with her daughter in stressful situations so that they can maintain a mutually positive, supportive  relationship, stating "I didn't mean to go off on her like that.  I was tired and it just felt like there was nothing I could do". Meghan [name] reported that she would adjust boundaries appropriately with people in network like this to minimize negative impact on mood as well, noting that she felt very drained this afternoon as a result.  Clinician will continue to monitor.   ?                                   ?Plan: Follow up again in 1 week.  ?  ?Diagnosis: Major depressive disorder, recurrent, moderate; Generalized Anxiety Disorder; and PTSD  ? ?Collaboration of Care:   No collaboration of care required for this visit. ?                                                  ?Patient/Guardian was advised Release of Information must be obtained prior to any record release in order to collaborate their care with an outside provider. Patient/Guardian was advised if they have not already done so to contact the registration department to sign all necessary forms in order for [name] to release information regarding their care.  ?  ?Consent: Patient/Guardian gives verbal consent for treatment and assignment of benefits for services provided during this visit. Patient/Guardian expressed understanding and agreed to proceed. ? ?[name], LCSW, LCAS ?12/11/21 ?

## 2021-12-18 ENCOUNTER — Ambulatory Visit (INDEPENDENT_AMBULATORY_CARE_PROVIDER_SITE_OTHER): Payer: Medicare Other | Admitting: Licensed Clinical Social Worker

## 2021-12-18 DIAGNOSIS — F4310 Post-traumatic stress disorder, unspecified: Secondary | ICD-10-CM

## 2021-12-18 DIAGNOSIS — F411 Generalized anxiety disorder: Secondary | ICD-10-CM

## 2021-12-18 DIAGNOSIS — F331 Major depressive disorder, recurrent, moderate: Secondary | ICD-10-CM | POA: Diagnosis not present

## 2021-12-18 NOTE — Progress Notes (Signed)
Virtual Visit via Video Note ?  ?I connected with Meghan [name] on 12/18/21 at 1:00pm by video enabled telemedicine application and verified that I am speaking with the correct person using two identifiers. ?  ?I discussed the limitations, risks, security and privacy concerns of performing an evaluation and management service by video and the availability of in person appointments. I also discussed with the patient that there may be a patient responsible charge related to this service. The patient expressed understanding and agreed to proceed. ?  ?I discussed the assessment and treatment plan with the patient. The patient was provided an opportunity to ask questions and all were answered. The patient agreed with the plan and demonstrated an understanding of the instructions. ?  ?The patient was advised to call back or seek an in-person evaluation if the symptoms worsen or if the condition fails to improve as anticipated. ?  ?I provided 1 hour of non-face-to-face time during this encounter. ?  ?  ?[name], LCSW, LCAS ?_______________________ ?THERAPIST PROGRESS NOTE ?  ?Session Time: 1:00pm - 2:00pm    ? ?Location: ?Patient: Patient home    ?Provider: [facility]  ?  ?Participation Level: Active  ?  ?Behavioral Response: Alert, casually dressed, euthymic mood/affect ?  ?Type of Therapy:  Individual Therapy ?  ?Treatment Goals addressed: Mood management; Maintaining healthy boundaries; Medication management ? ?Progress Towards Goals: Progressing ?  ?Interventions: CBT, communication skills    ?  ?Summary: Meghan [name] is a 56 year old divorced African American female that presented for therapy appointment today with diagnoses of Major Depressive Disorder, recurrent, moderate, Generalized Anxiety Disorder and PTSD.    ?  ?Suicidal/Homicidal: None; without plan or intent  ?                                                                                                              ?Therapist Response:  Clinician met  with Meghan [name] for virtual therapy session and assessed for safety, sobriety, and medication compliance.  Meghan [name] presented for appointment on time and spoke in a manner that was alert, oriented x5, with no evidence or self-report of active SI/HI or A/V H.  Meghan [name] reported ongoing compliance with medication and denied any use of alcohol or illicit substances.  Clinician inquired about Meghan [name]'s emotional ratings today, as well as any significant changes in thoughts, feelings, or behavior since last check-in.  Meghan [name] reported scores of 0/10 for depression, 0/10 for anxiety, and 0/10 for anger/irritability.  Meghan [name] also denied experiencing any recent outbursts or panic attacks.  Meghan [name] reported that she has been staying busy with church activities, which has led her to be tired today.  She reported that she has a medical appointment coming up, but is not experiencing anxiety about this as she normally would, which makes her proud.  Meghan [name] reported that a recent challenge was having a dream about her cousin the other night where she was upset about ongoing behavior, and 'cursed her out'.  Meghan [name] stated "she is extremely nosy and always  talks behind our backs to people".  Clinician reviewed material with Meghan [name] today on communication skills which could be utilized to increase understanding and support within the relationship with her cousin to avoid conflict.  Clinician presented a handout on 'soft startups' which offered suggestions on how Meghan [name] could address a problem assertively with family, including tips such as choosing an appropriate time/setting, being mindful of maintaining gentle tone, volume and language, while avoiding triggering nonverbals such as rolling eyes, as well as utilizing ?I? statements to express feelings, focusing on one problem at a time, and being respectful.  Intervention was effective, as evidenced by Meghan [name] actively engaging in discussion on subject, and expressing receptiveness to suggestions offered,  stating "I've been holding my tongue.  I don't see myself popping off at her because I respect my elders, but having a mature conversation with her could be a good thing.  I don't have the time or energy to argue with somebody; I'm trying to remain calm and collected".  Clinician will continue to monitor.   ?                                   ?Plan: Follow up again in 1 week.  ?  ?Diagnosis: Major depressive disorder, recurrent, moderate; Generalized Anxiety Disorder; and PTSD  ? ?Collaboration of Care:   No collaboration of care required for this visit. ?                                                  ?Patient/Guardian was advised Release of Information must be obtained prior to any record release in order to collaborate their care with an outside provider. Patient/Guardian was advised if they have not already done so to contact the registration department to sign all necessary forms in order for [name] to release information regarding their care.  ?  ?Consent: Patient/Guardian gives verbal consent for treatment and assignment of benefits for services provided during this visit. Patient/Guardian expressed understanding and agreed to proceed. ? ?[name], LCSW, LCAS ?12/18/21 ?

## 2021-12-25 ENCOUNTER — Ambulatory Visit (INDEPENDENT_AMBULATORY_CARE_PROVIDER_SITE_OTHER): Payer: Medicare Other | Admitting: Licensed Clinical Social Worker

## 2021-12-25 DIAGNOSIS — F331 Major depressive disorder, recurrent, moderate: Secondary | ICD-10-CM

## 2021-12-25 DIAGNOSIS — F411 Generalized anxiety disorder: Secondary | ICD-10-CM | POA: Diagnosis not present

## 2021-12-25 DIAGNOSIS — F4310 Post-traumatic stress disorder, unspecified: Secondary | ICD-10-CM | POA: Diagnosis not present

## 2021-12-25 NOTE — Progress Notes (Signed)
Virtual Visit via Video Note ?  ?I connected with Meghan [name] on 12/25/21 at 1:00pm by video enabled telemedicine application and verified that I am speaking with the correct person using two identifiers. ?  ?I discussed the limitations, risks, security and privacy concerns of performing an evaluation and management service by video and the availability of in person appointments. I also discussed with the patient that there may be a patient responsible charge related to this service. The patient expressed understanding and agreed to proceed. ?  ?I discussed the assessment and treatment plan with the patient. The patient was provided an opportunity to ask questions and all were answered. The patient agreed with the plan and demonstrated an understanding of the instructions. ?  ?The patient was advised to call back or seek an in-person evaluation if the symptoms worsen or if the condition fails to improve as anticipated. ?  ?I provided 48 minutes of non-face-to-face time during this encounter. ?  ?  ?Noralee Stain, LCSW, LCAS ?_______________________ ?THERAPIST PROGRESS NOTE ?  ?Session Time: 1:00pm - 1:48pm     ? ?Location: ?Patient: Patient home    ?Provider: [facility]  ?  ?Participation Level: Active  ?  ?Behavioral Response: Alert, casually dressed, euthymic mood/affect ?  ?Type of Therapy:  Individual Therapy ?  ?Treatment Goals addressed: Mood management; Medication management ? ?Progress Towards Goals: Progressing ?  ?Interventions: CBT: gratitude journaling   ?  ?Summary: Meghan [name] is a 56 year old divorced African American female that presented for therapy appointment today with diagnoses of Major Depressive Disorder, recurrent, moderate, Generalized Anxiety Disorder and PTSD.    ?  ?Suicidal/Homicidal: None; without plan or intent  ?                                                                                                              ?Therapist Response:  Clinician met with Meghan [name] for virtual  therapy appointment and assessed for safety, sobriety, and medication compliance.  Meghan [name] presented for session on time and spoke in a manner that was alert, oriented x5, with no evidence or self-report of active SI/HI or A/V H.  Meghan [name] reported that she continues taking medication as prescribed and [name] any use of alcohol or illicit substances.  Clinician inquired about Meghan [name]'s current emotional ratings, as well as any significant changes in thoughts, feelings, or behavior since previous check-in.  Meghan [name] reported scores of 0/10 for depression, 2/10 for anxiety, and 0/10 for anger/irritability.  Meghan [name] experiencing any recent outbursts or panic attacks.  Meghan [name] reported that a recent success was helping to organize the easter play at her church stating "It went really well.  I was shocked".  Meghan [name] reported that an additional success was getting approved for a wrist surgery, and not worrying about it as much as she usually would when faced with surgery, stating "I know I will be alright, its just that they're cutting into my wrist this time".  Clinician discussed topic of gratitude journaling with Su today.  Clinician discussed the benefits this activity offers to one's mental health if practiced regularly, including increased happiness/mood, greater life satisfaction, and resiliency.  Clinician provided Meghan [name] with several journal prompts to write about aloud in session today, including examples such as ?What was your favorite moment this week so far??, ?List 5 things you like about yourself?, and ?What is one of your greatest life achievements so far??.  Intervention was effective, as evidenced by Meghan [name] participating in exercise successfully, and answering all gratitude questions appropriately, which led to productive discussion upon her personal strengths, supports, and accomplishments.     ?Clinician will continue to monitor.   ?                                   ?Plan: Follow up again in 1 week.  ?   ?Diagnosis: Major depressive disorder, recurrent, moderate; Generalized Anxiety Disorder; and PTSD  ? ?Collaboration of Care:   No collaboration of care required for this visit. ?                                                  ?Patient/Guardian was advised Release of Information must be obtained prior to any record release in order to collaborate their care with an outside provider. Patient/Guardian was advised if they have not already done so to contact the registration department to sign all necessary forms in order for [name] to release information regarding their care.  ?  ?Consent: Patient/Guardian gives verbal consent for treatment and assignment of benefits for services provided during this visit. Patient/Guardian expressed understanding and agreed to proceed. ? ?[name], LCSW, LCAS ?12/25/21 ?

## 2021-12-27 NOTE — Progress Notes (Signed)
Reviewed chart with Dr. Kalman Shan, he stated patient needed to be done in the Main OR. Hassan Rowan at Dr. Levell July office aware, voicemail left. ?

## 2022-01-01 ENCOUNTER — Emergency Department (HOSPITAL_COMMUNITY): Payer: Medicare Other

## 2022-01-01 ENCOUNTER — Ambulatory Visit (HOSPITAL_COMMUNITY): Payer: Medicare Other | Admitting: Licensed Clinical Social Worker

## 2022-01-01 ENCOUNTER — Telehealth (INDEPENDENT_AMBULATORY_CARE_PROVIDER_SITE_OTHER): Payer: Medicare Other | Admitting: Licensed Clinical Social Worker

## 2022-01-01 ENCOUNTER — Emergency Department (HOSPITAL_COMMUNITY)
Admission: EM | Admit: 2022-01-01 | Discharge: 2022-01-01 | Disposition: A | Discharge status: Home / Self Care | Payer: Medicare Other | Attending: Emergency Medicine | Admitting: Emergency Medicine

## 2022-01-01 ENCOUNTER — Encounter (HOSPITAL_COMMUNITY): Payer: Self-pay

## 2022-01-01 ENCOUNTER — Other Ambulatory Visit: Payer: Self-pay

## 2022-01-01 DIAGNOSIS — Z79899 Other long term (current) drug therapy: Secondary | ICD-10-CM | POA: Diagnosis not present

## 2022-01-01 DIAGNOSIS — N9489 Other specified conditions associated with female genital organs and menstrual cycle: Secondary | ICD-10-CM | POA: Insufficient documentation

## 2022-01-01 DIAGNOSIS — Z7901 Long term (current) use of anticoagulants: Secondary | ICD-10-CM | POA: Insufficient documentation

## 2022-01-01 DIAGNOSIS — Z7984 Long term (current) use of oral hypoglycemic drugs: Secondary | ICD-10-CM | POA: Diagnosis not present

## 2022-01-01 DIAGNOSIS — R0789 Other chest pain: Secondary | ICD-10-CM | POA: Insufficient documentation

## 2022-01-01 DIAGNOSIS — R519 Headache, unspecified: Secondary | ICD-10-CM | POA: Diagnosis not present

## 2022-01-01 DIAGNOSIS — M542 Cervicalgia: Secondary | ICD-10-CM | POA: Diagnosis not present

## 2022-01-01 DIAGNOSIS — R079 Chest pain, unspecified: Secondary | ICD-10-CM

## 2022-01-01 LAB — CBC
HCT: 33.6 % — ABNORMAL LOW (ref 36.0–46.0)
Hemoglobin: 10.3 g/dL — ABNORMAL LOW (ref 12.0–15.0)
MCH: 26.2 pg (ref 26.0–34.0)
MCHC: 30.7 g/dL (ref 30.0–36.0)
MCV: 85.5 fL (ref 80.0–100.0)
Platelets: 235 10*3/uL (ref 150–400)
RBC: 3.93 MIL/uL (ref 3.87–5.11)
RDW: 14.7 % (ref 11.5–15.5)
WBC: 9 10*3/uL (ref 4.0–10.5)
nRBC: 0 % (ref 0.0–0.2)

## 2022-01-01 LAB — BASIC METABOLIC PANEL
Anion gap: 5 (ref 5–15)
BUN: 17 mg/dL (ref 6–20)
CO2: 27 mmol/L (ref 22–32)
Calcium: 9.8 mg/dL (ref 8.9–10.3)
Chloride: 108 mmol/L (ref 98–111)
Creatinine, Ser: 0.76 mg/dL (ref 0.44–1.00)
GFR, Estimated: >60 mL/min (ref >60)
Glucose, Bld: 93 mg/dL (ref 70–99)
Potassium: 4.4 mmol/L (ref 3.5–5.1)
Sodium: 140 mmol/L (ref 135–145)

## 2022-01-01 LAB — TROPONIN I (HIGH SENSITIVITY)
Troponin I (High Sensitivity): 4 ng/L (ref <18)
Troponin I (High Sensitivity): 4 ng/L (ref <18)

## 2022-01-01 LAB — I-STAT BETA HCG BLOOD, ED (MC, WL, AP ONLY): I-stat hCG, quantitative: 5.7 m[IU]/mL — ABNORMAL HIGH (ref <5)

## 2022-01-01 MED ORDER — METOCLOPRAMIDE HCL 10 MG PO TABS: 10.0000 mg | ORAL_TABLET | Freq: Four times a day (QID) | ORAL | 0 refills | Status: DC | PRN

## 2022-01-01 MED ORDER — SODIUM CHLORIDE 0.9 % IV BOLUS
1000.0000 mL | Freq: Once | INTRAVENOUS | Status: AC
Start: 2022-01-01 — End: 2022-01-01
  Administered 2022-01-01: 1000 mL via INTRAVENOUS

## 2022-01-01 MED ORDER — METOCLOPRAMIDE HCL 5 MG/ML IJ SOLN
10.0000 mg | Freq: Once | INTRAMUSCULAR | Status: AC
  Administered 2022-01-01: 10 mg via INTRAVENOUS
  Filled 2022-01-01: qty 2

## 2022-01-01 MED ORDER — MORPHINE SULFATE (PF) 4 MG/ML IV SOLN
4.0000 mg | Freq: Once | INTRAVENOUS | Status: AC
Start: 2022-01-01 — End: 2022-01-01
  Administered 2022-01-01: 4 mg via INTRAVENOUS
  Filled 2022-01-01: qty 1

## 2022-01-01 MED ORDER — DIPHENHYDRAMINE HCL 50 MG/ML IJ SOLN
25.0000 mg | Freq: Once | INTRAMUSCULAR | Status: AC
  Administered 2022-01-01: 25 mg via INTRAVENOUS
  Filled 2022-01-01: qty 1

## 2022-01-01 MED ORDER — IOHEXOL 350 MG/ML SOLN
75.0000 mL | Freq: Once | INTRAVENOUS | Status: AC | PRN
  Administered 2022-01-01: 75 mL via INTRAVENOUS

## 2022-01-01 MED ORDER — ALBUTEROL SULFATE HFA 108 (90 BASE) MCG/ACT IN AERS
2.0000 | INHALATION_SPRAY | Freq: Once | RESPIRATORY_TRACT | Status: AC
Start: 2022-01-01 — End: 2022-01-01
  Administered 2022-01-01: 2 via RESPIRATORY_TRACT
  Filled 2022-01-01: qty 6.7

## 2022-01-01 NOTE — Discharge Instructions (Addendum)
Your heart enzymes are normal today.  Your CT brain and neck are unremarkable. ? ?You likely have migraines. ? ?Take Tylenol or Motrin for pain. ? ?Take Reglan for severe headaches ? ?See your doctor for follow-up  ? ?Return to ER if you have worse chest pain, headache, vomiting ? ? ?

## 2022-01-01 NOTE — ED Provider Triage Note (Signed)
Emergency Medicine Provider Triage Evaluation Note ? ?Meghan [name] , a 56 y.o. female  was evaluated in triage.  Pt complains of chest pain pain to right arm and right neck  ? ?Review of Systems  ?Positive: headache ?Negative: No fever no cough, no shortness of breath  ? ?Physical Exam  ?BP (!) 115/36 (BP Location: Left Arm)   Pulse 70   Temp 98.3 ?F (36.8 ?C)   Resp 14   SpO2 97%  ?Gen:   Awake, no distress   ?Resp:  Normal effort  ?MSK:   Moves extremities without difficulty  ?Other:   ? ?Medical Decision Making  ?Medically screening exam initiated at 11:20 AM.  Appropriate orders placed.  Meghan [name] was informed that the remainder of the evaluation will be completed by another provider, this initial triage assessment does not replace that evaluation, and the importance of remaining in the ED until their evaluation is complete. ? ? ?  ?Meghan [name], [place] ?01/01/22 1121 ? ?

## 2022-01-01 NOTE — Telephone Encounter (Signed)
Meghan [name] had a virtual therapy appointment scheduled today at 2pm.  Clinician outreached Meghan [name] by phone at 2:05pm when she had not presented on time.  [name] answered call and reported that she was currently in the ER waiting to be seen, as she experienced chest pain, a headache, pain on her right side, and 2 asthma attacks today.  Clinician and [name] agreed to cancel today's appointment due to lack of privacy offered in the waiting area of the ER.  She agreed to follow up next week.  Clinician will continue to monitor.   ? ?[name], LCSW, LCAS ?01/01/21 ?

## 2022-01-01 NOTE — ED Triage Notes (Signed)
Pt to triage via [facility] from home.  Reports headache, substernal chest pain, neck pain, and R shoulder pain x 12 hours.  Denies SOB, nausea, and vomiting. ? ?CBG 100   ?

## 2022-01-01 NOTE — ED Provider Notes (Signed)
?[facility] ?Provider Note ? ? ?CSN: [id number] ?Arrival date & time: 01/01/22  1023 ? ?  ? ?History ? ?Chief Complaint  ?Patient presents with  ? Chest Pain  ? ? ?Meghan [name] is a 56 y.o. female history of A-fib, PE status post IVC filter, here presenting with chest pain and headache and neck pain.  Patient states that she had a cute onset of headache and neck pain yesterday.  She also has some right-sided chest pain as well.  Patient had previous history of stroke in the past but denies any trouble speaking or numbness or weakness.  Denies any fevers.  States that she was unable to sleep because of the pain. ? ?The history is provided by the patient.  ? ?  ? ?Home Medications ?Prior to Admission medications   ?Medication Sig Start Date End Date Taking? Authorizing Provider  ?acetaminophen (TYLENOL) 500 MG tablet Take 1,000 mg by mouth every 6 (six) hours as needed for moderate pain.   Yes [provider]  ?albuterol (VENTOLIN HFA) 108 (90 Base) MCG/ACT inhaler Inhale 2 puffs into the lungs every 6 (six) hours as needed for wheezing or shortness of breath.   Yes [provider]  ?atorvastatin (LIPITOR) 40 MG tablet Take 40 mg by mouth every evening. 07/26/21  Yes [provider]  ?BREZTRI AEROSPHERE 160-9-4.8 MCG/ACT AERO Inhale 2 puffs into the lungs in the morning and at bedtime.  03/24/20  Yes [provider]  ?bumetanide (BUMEX) 1 MG tablet Take 1 mg by mouth daily. 10/24/21  Yes [provider]  ?Cholecalciferol (VITAMIN D-3) 25 MCG (1000 UT) CAPS Take 1,000 Units by mouth daily.   Yes [provider]  ?flecainide (TAMBOCOR) 50 MG tablet Take 50 mg by mouth 2 (two) times daily.  09/04/19  Yes [provider]  ?ketoconazole (NIZORAL) 2 % cream Apply to bottoms of feet daily for 4 weeks. ?Patient taking differently: 1 application. in the morning, at noon, and at bedtime. 11/13/21  Yes Bronson Ing, DPM   ?levETIRAcetam (KEPPRA) 500 MG tablet Take 1 tablet (500 mg total) by mouth 2 (two) times daily. 07/04/21  Yes Marcial Pacas, MD  ?metFORMIN (GLUCOPHAGE) 500 MG tablet Take 500 mg by mouth daily with breakfast.   Yes [provider]  ?metoprolol succinate (TOPROL-XL) 25 MG 24 hr tablet Take 25 mg by mouth daily. 05/02/21  Yes [provider]  ?montelukast (SINGULAIR) 10 MG tablet Take 10 mg by mouth at bedtime. 10/12/21  Yes [provider]  ?Omega-3 Fatty Acids (FISH OIL) 1000 MG CAPS Take 1,000 mg by mouth daily with breakfast.    Yes [provider]  ?omeprazole (PRILOSEC) 40 MG capsule Take 40 mg by mouth daily. 08/17/20  Yes [provider]  ?ondansetron (ZOFRAN-ODT) 4 MG disintegrating tablet Take 1 tablet (4 mg total) by mouth every 8 (eight) hours as needed for nausea or vomiting. 11/27/21  Yes Regan Lemming, MD  ?rivaroxaban (XARELTO) 20 MG TABS tablet Take 20 mg by mouth at bedtime.    Yes [provider]  ?RYBELSUS 14 MG TABS Take 14 mg by mouth daily. 07/27/21  Yes [provider]  ?sertraline (ZOLOFT) 50 MG tablet Take 50 mg by mouth daily. 01/01/21  Yes [provider]  ?furosemide (LASIX) 40 MG tablet Take 40 mg by mouth daily with breakfast.     [provider]  ?LORazepam (ATIVAN) 1 MG tablet Take 2 tablets prior to leaving  house on day of office surgery.  Bring third tablet with you to the office on the day of office surgery. ?Patient not taking: Reported on 09/11/2021 08/24/21   Waynetta Sandy, MD  ?meclizine (ANTIVERT) 25 MG tablet Take 1 tablet (25 mg total) by mouth 3 (three) times daily as needed for dizziness. ?Patient not taking: Reported on 09/06/2021 01/11/21   Domenic Moras, PA-C  ?methocarbamol (ROBAXIN) 500 MG tablet Take 1 tablet (500 mg total) by mouth 2 (two) times daily. ?Patient not taking: Reported on 01/01/2022 12/14/20   Tedd Sias, PA  ?Multiple Vitamins-Minerals (PRESERVISION AREDS 2 PO) Take  2 capsules by mouth daily with breakfast.     [provider]  ?oxyCODONE-acetaminophen (PERCOCET/ROXICET) 5-325 MG tablet Take 1 tablet by mouth every 6 (six) hours as needed for severe pain. 11/27/21   Regan Lemming, MD  ?   ? ?Allergies    ?Acetaminophen-codeine, Other, Tramadol, Codeine, Coconut oil, Meloxicam, and Tomato   ? ?Review of Systems   ?Review of Systems  ?Cardiovascular:  Positive for chest pain.  ?Musculoskeletal:  Positive for neck pain.  ?Neurological:  Positive for headaches.  ? ?Physical Exam ?Updated Vital Signs ?BP 129/64   Pulse 66   Temp 98.2 ?F (36.8 ?C) (Oral)   Resp (!) 23   SpO2 100%  ?Physical Exam ?Vitals and nursing note reviewed.  ?Constitutional:   ?   Comments: Uncomfortable  ?HENT:  ?   Head: Normocephalic.  ?Eyes:  ?   Extraocular Movements: Extraocular movements intact.  ?   Pupils: Pupils are equal, round, and reactive to light.  ?Neck:  ?   Comments: Right para cervical tenderness ?Cardiovascular:  ?   Rate and Rhythm: Normal rate and regular rhythm.  ?   Heart sounds: Normal heart sounds.  ?Pulmonary:  ?   Effort: Pulmonary effort is normal.  ?   Breath sounds: Normal breath sounds.  ?Abdominal:  ?   General: Bowel sounds are normal.  ?   Palpations: Abdomen is soft.  ?Musculoskeletal:     ?   General: Normal range of motion.  ?   Cervical back: Normal range of motion and neck supple.  ?Skin: ?   Capillary Refill: Capillary refill takes less than 2 seconds.  ?Neurological:  ?   General: No focal deficit present.  ?   Mental Status: She is alert and oriented to person, place, and time.  ?   Comments: Cranial nerves II to XII is intact.  Normal strength and sensation bilateral arms and legs.  ?Psychiatric:     ?   Mood and Affect: Mood normal.     ?   Behavior: Behavior normal.  ? ? ?ED Results / Procedures / Treatments   ?Labs ?(all labs ordered are listed, but only abnormal results are displayed) ?Labs Reviewed  ?CBC - Abnormal; Notable for the following  components:  ?    Result Value  ? Hemoglobin 10.3 (*)   ? HCT 33.6 (*)   ? All other components within normal limits  ?I-STAT BETA HCG BLOOD, ED ([facility], [facility], [facility] ONLY) - Abnormal; Notable for the following components:  ? I-stat hCG, quantitative 5.7 (*)   ? All other components within normal limits  ?BASIC METABOLIC PANEL  ?TROPONIN I (HIGH SENSITIVITY)  ?TROPONIN I (HIGH SENSITIVITY)  ? ? ?EKG ?EKG Interpretation ? ?Date/Time:  Monday January 01 2022 10:31:55 EDT ?Ventricular Rate:  65 ?PR Interval:  200 ?QRS Duration: 84 ?QT Interval:  382 ?  QTC Calculation: 397 ?R Axis:   53 ?Text Interpretation: Normal sinus rhythm Low voltage QRS Cannot rule out Anterior infarct , age undetermined Abnormal ECG When compared with ECG of 05-Dec-2021 10:18, PREVIOUS ECG IS PRESENT Confirmed by Wandra Arthurs [phone]) on 01/01/2022 4:30:06 PM ? ?Radiology ?DG Chest 2 View ? ?Result Date: 01/01/2022 ?CLINICAL DATA:  Chest pain. EXAM: CHEST - 2 VIEW COMPARISON:  December 05, 2021. FINDINGS: Mild cardiomegaly is noted. Both lungs are clear. The visualized skeletal structures are unremarkable. IMPRESSION: No active cardiopulmonary disease. Electronically Signed   By: Marijo Conception M.D.   On: 01/01/2022 10:51   ? ?Procedures ?Procedures  ? ? ?Medications Ordered in ED ?Medications  ?albuterol (VENTOLIN HFA) 108 (90 Base) MCG/ACT inhaler 2 puff (2 puffs Inhalation Given 01/01/22 1727)  ?morphine (PF) 4 MG/ML injection 4 mg (4 mg Intravenous Given 01/01/22 1734)  ?metoCLOPramide (REGLAN) injection 10 mg (10 mg Intravenous Given 01/01/22 1732)  ?diphenhydrAMINE (BENADRYL) injection 25 mg (25 mg Intravenous Given 01/01/22 1728)  ?sodium chloride 0.9 % bolus 1,000 mL (1,000 mLs Intravenous New Bag/Given 01/01/22 1738)  ? ? ?ED Course/ Medical Decision Making/ A&P ?  ?                        ?Medical Decision Making ?RAYNIE STEINHAUS is a 56 y.o. female here with headache and neck pain and chest pain.  I think likely migraines versus carotid dissection.  Low  suspicion for PE or ACS.  Plan to get CTA head and neck and give migraine cocktail.  Will get troponin x2 as well. ? ?8:27 PM ?Troponin negative x2.  CTA unremarkable.  Given migraine cocktail and felt better.  Likely migraine

## 2022-01-08 ENCOUNTER — Ambulatory Visit (INDEPENDENT_AMBULATORY_CARE_PROVIDER_SITE_OTHER): Payer: Medicare Other | Admitting: Licensed Clinical Social Worker

## 2022-01-08 DIAGNOSIS — F411 Generalized anxiety disorder: Secondary | ICD-10-CM

## 2022-01-08 DIAGNOSIS — F4310 Post-traumatic stress disorder, unspecified: Secondary | ICD-10-CM

## 2022-01-08 DIAGNOSIS — F331 Major depressive disorder, recurrent, moderate: Secondary | ICD-10-CM | POA: Diagnosis not present

## 2022-01-08 NOTE — Progress Notes (Signed)
Virtual Visit via Video Note ?  ?I connected with Taheera S. Jian on 01/08/22 at 1:00pm by video enabled telemedicine application and verified that I am speaking with the correct person using two identifiers. ?  ?I discussed the limitations, risks, security and privacy concerns of performing an evaluation and management service by video and the availability of in person appointments. I also discussed with the patient that there may be a patient responsible charge related to this service. The patient expressed understanding and agreed to proceed. ?  ?I discussed the assessment and treatment plan with the patient. The patient was provided an opportunity to ask questions and all were answered. The patient agreed with the plan and demonstrated an understanding of the instructions. ?  ?The patient was advised to call back or seek an in-person evaluation if the symptoms worsen or if the condition fails to improve as anticipated. ?  ?I provided 1 hour of non-face-to-face time during this encounter. ?  ?  ?[name], LCSW, LCAS ?_______________________ ?THERAPIST PROGRESS NOTE ?  ?Session Time: 1:00pm - 2:00pm     ? ?Location: ?Patient: Patient home    ?Provider: [facility]  ?  ?Participation Level: Active  ?  ?Behavioral Response: Alert, casually dressed, irritable mood/affect ?  ?Type of Therapy:  Individual Therapy ?  ?Treatment Goals addressed: Mood management; Medication management ? ?Progress Towards Goals: Progressing ?  ?Interventions: CBT ; psychoeducation on grief and loss   ?  ?Summary: Harriette Bouillon. Schraeder is a 56 year old divorced African American female that presented for therapy appointment today with diagnoses of Major Depressive Disorder, recurrent, moderate, Generalized Anxiety Disorder and PTSD.    ?  ?Suicidal/Homicidal: None; without plan or intent  ?                                                                                                              ?Therapist Response:  Clinician met with Cortnee for  virtual therapy session and assessed for safety, sobriety, and medication compliance.  Elleen presented for appointment on time and spoke in a manner that was alert, oriented x5, with no evidence or self-report of active SI/HI or A/V H.  Loraine reported ongoing compliance with medication and denied any use of alcohol or illicit substances.  Clinician inquired about Diasia's emotional ratings today, as well as any significant changes in thoughts, feelings, or behavior since last check-in.  Ziza reported scores of 0/10 for depression, 0/10 for anxiety, and 10/10 for irritability.  Oral also denied experiencing any recent outbursts or panic attacks.  Bianney reported that when she went to the hospital last week they determined that she had a cold, although she felt like it was worse because of how bad she felt.  Zamaya stated "My doctor thinks I have a sinus infection now and I'm on antibiotics".  [name] reported that an additional challenge she faced since our last checkin was having her aunt pass away, who she was close to, and regrets not seeing on her 100th birthday.  Clinician  expressed sympathy for Kaegan's loss, and provided her with psychoeducation on the 5 stages of grief, including denial, anger, bargaining, depression, and acceptance.  Clinician discussed how each stage can affect an individual, and provided strategies on how to faciliate healthy grieving as she processes this loss. Strategies provided to [facility] included taking time to allow healthy emotional expression (I.e. allowing oneself to cry when appropriate), engaging in healthy self-care activities for distraction, talking to people who can relate to the loss for support, and considering ways to memorialize the aunt.  Interventions were effective, as evidenced by [name] actively engaging in discussion on subject, reporting that she believes she is currently working towards accepting this loss, and trying to focus on the positive aspects of this loss to  avoid falling into depression, stating "I'm glad she was a part of my life for 58 years".  Kseniya reported that she will plan to attend the funeral with family this weekend at a local church, and write a poem to read to the congregation about her.  Janalynn stated "That might be hard for me, but my sister said she would stand with me and take over if I need her".  Clinician will continue to monitor.   ?                                   ?Plan: Follow up again in 1 week.  ?  ?Diagnosis: Major depressive disorder, recurrent, moderate; Generalized Anxiety Disorder; and PTSD  ? ?Collaboration of Care:   No collaboration of care required for this visit. ?                                                  ?Patient/Guardian was advised Release of Information must be obtained prior to any record release in order to collaborate their care with an outside provider. Patient/Guardian was advised if they have not already done so to contact the registration department to sign all necessary forms in order for [name] to release information regarding their care.  ?  ?Consent: Patient/Guardian gives verbal consent for treatment and assignment of benefits for services provided during this visit. Patient/Guardian expressed understanding and agreed to proceed. ? ?[name], LCSW, LCAS ?01/08/22 ? ?

## 2022-01-09 ENCOUNTER — Other Ambulatory Visit: Payer: Self-pay

## 2022-01-09 ENCOUNTER — Emergency Department (HOSPITAL_COMMUNITY)
Admission: EM | Admit: 2022-01-09 | Discharge: 2022-01-09 | Disposition: A | Discharge status: Home / Self Care | Payer: Medicare Other | Attending: Emergency Medicine | Admitting: Emergency Medicine

## 2022-01-09 ENCOUNTER — Emergency Department (HOSPITAL_COMMUNITY): Payer: Medicare Other

## 2022-01-09 DIAGNOSIS — Z7984 Long term (current) use of oral hypoglycemic drugs: Secondary | ICD-10-CM | POA: Diagnosis not present

## 2022-01-09 DIAGNOSIS — R062 Wheezing: Secondary | ICD-10-CM | POA: Diagnosis not present

## 2022-01-09 DIAGNOSIS — Z79899 Other long term (current) drug therapy: Secondary | ICD-10-CM | POA: Diagnosis not present

## 2022-01-09 DIAGNOSIS — R0602 Shortness of breath: Secondary | ICD-10-CM | POA: Diagnosis present

## 2022-01-09 DIAGNOSIS — R0789 Other chest pain: Secondary | ICD-10-CM | POA: Insufficient documentation

## 2022-01-09 DIAGNOSIS — R079 Chest pain, unspecified: Secondary | ICD-10-CM

## 2022-01-09 MED ORDER — ALBUTEROL SULFATE HFA 108 (90 BASE) MCG/ACT IN AERS
2.0000 | INHALATION_SPRAY | RESPIRATORY_TRACT | Status: DC | PRN
  Administered 2022-01-09: 2 via RESPIRATORY_TRACT
  Filled 2022-01-09: qty 6.7

## 2022-01-09 NOTE — ED Provider Notes (Signed)
?[facility] DEPT ?Provider Note ? ? ?CSN: [id number] ?Arrival date & time: 01/09/22  1453 ? ?  ? ?History ? ?Chief Complaint  ?Patient presents with  ? Shortness of Breath  ? ? ?Meghan [name] is a 56 y.o. female. ? ? ?Shortness of Breath ?Associated symptoms: chest pain   ?Associated symptoms: no abdominal pain   ?Patient presents with shortness of breath and chest pain.  Began acutely after leaving the dentist.  States that she was waiting for the bus and got a with the smoke and began to have chest pain and trouble breathing.  Improved somewhat after breathing treatment now.  Some chest tightness.  Also some tightness in her throat.  States she got started on medicine by the dentist but had not get them filled yet.  Not coughing up any sputum. ?  ? ?Home Medications ?Prior to Admission medications   ?Medication Sig Start Date End Date Taking? Authorizing Provider  ?acetaminophen (TYLENOL) 500 MG tablet Take 1,000 mg by mouth every 6 (six) hours as needed for moderate pain.    [provider]  ?albuterol (VENTOLIN HFA) 108 (90 Base) MCG/ACT inhaler Inhale 2 puffs into the lungs every 6 (six) hours as needed for wheezing or shortness of breath.    [provider]  ?atorvastatin (LIPITOR) 40 MG tablet Take 40 mg by mouth every evening. 07/26/21   [provider]  ?Judithann Sauger AEROSPHERE 160-9-4.8 MCG/ACT AERO Inhale 2 puffs into the lungs in the morning and at bedtime.  03/24/20   [provider]  ?bumetanide (BUMEX) 1 MG tablet Take 1 mg by mouth daily. 10/24/21   [provider]  ?Cholecalciferol (VITAMIN D-3) 25 MCG (1000 UT) CAPS Take 1,000 Units by mouth daily.    [provider]  ?flecainide (TAMBOCOR) 50 MG tablet Take 50 mg by mouth 2 (two) times daily.  09/04/19   [provider]  ?furosemide (LASIX) 40 MG tablet Take 40 mg by mouth daily with breakfast.     [provider]  ?ketoconazole (NIZORAL) 2 % cream Apply to  bottoms of feet daily for 4 weeks. ?Patient taking differently: 1 application. in the morning, at noon, and at bedtime. 11/13/21   Bronson Ing, DPM  ?levETIRAcetam (KEPPRA) 500 MG tablet Take 1 tablet (500 mg total) by mouth 2 (two) times daily. 07/04/21   Marcial Pacas, MD  ?LORazepam (ATIVAN) 1 MG tablet Take 2 tablets prior to leaving house on day of office surgery.  Bring third tablet with you to the office on the day of office surgery. ?Patient not taking: Reported on 09/11/2021 08/24/21   Waynetta Sandy, MD  ?meclizine (ANTIVERT) 25 MG tablet Take 1 tablet (25 mg total) by mouth 3 (three) times daily as needed for dizziness. ?Patient not taking: Reported on 09/06/2021 01/11/21   Domenic Moras, PA-C  ?metFORMIN (GLUCOPHAGE) 500 MG tablet Take 500 mg by mouth daily with breakfast.    [provider]  ?methocarbamol (ROBAXIN) 500 MG tablet Take 1 tablet (500 mg total) by mouth 2 (two) times daily. ?Patient not taking: Reported on 01/01/2022 12/14/20   Tedd Sias, PA  ?metoCLOPramide (REGLAN) 10 MG tablet Take 1 tablet (10 mg total) by mouth every 6 (six) hours as needed for nausea (nausea/headache). 01/01/22   Drenda Freeze, MD  ?metoprolol succinate (TOPROL-XL) 25 MG 24 hr tablet Take 25 mg by mouth daily. 05/02/21   [provider]  ?montelukast (SINGULAIR) 10 MG tablet Take 10  mg by mouth at bedtime. 10/12/21   [provider]  ?Multiple Vitamins-Minerals (PRESERVISION AREDS 2 PO) Take 2 capsules by mouth daily with breakfast.     [provider]  ?Omega-3 Fatty Acids (FISH OIL) 1000 MG CAPS Take 1,000 mg by mouth daily with breakfast.     [provider]  ?omeprazole (PRILOSEC) 40 MG capsule Take 40 mg by mouth daily. 08/17/20   [provider]  ?ondansetron (ZOFRAN-ODT) 4 MG disintegrating tablet Take 1 tablet (4 mg total) by mouth every 8 (eight) hours as needed for nausea or vomiting. 11/27/21   Regan Lemming, MD  ?oxyCODONE-acetaminophen  (PERCOCET/ROXICET) 5-325 MG tablet Take 1 tablet by mouth every 6 (six) hours as needed for severe pain. 11/27/21   Regan Lemming, MD  ?rivaroxaban (XARELTO) 20 MG TABS tablet Take 20 mg by mouth at bedtime.     [provider]  ?RYBELSUS 14 MG TABS Take 14 mg by mouth daily. 07/27/21   [provider]  ?sertraline (ZOLOFT) 50 MG tablet Take 50 mg by mouth daily. 01/01/21   [provider]  ?   ? ?Allergies    ?Acetaminophen-codeine, Other, Tramadol, Codeine, Coconut oil, Meloxicam, and Tomato   ? ?Review of Systems   ?Review of Systems  ?Constitutional:  Negative for appetite change.  ?Respiratory:  Positive for shortness of breath.   ?Cardiovascular:  Positive for chest pain.  ?Gastrointestinal:  Negative for abdominal pain.  ?Musculoskeletal:  Negative for back pain.  ?Neurological:  Negative for weakness.  ? ?Physical Exam ?Updated Vital Signs ?BP (!) 152/77   Pulse 75   Resp (!) 25   SpO2 100%  ?Physical Exam ?Vitals and nursing note reviewed.  ?HENT:  ?   Head: Normocephalic.  ?Cardiovascular:  ?   Rate and Rhythm: Regular rhythm.  ?Pulmonary:  ?   Breath sounds: Wheezing present.  ?   Comments: Mild wheezing. ?Musculoskeletal:  ?   Right lower leg: No edema.  ?   Left lower leg: No edema.  ?Lymphadenopathy:  ?   Cervical: No cervical adenopathy.  ?Skin: ?   General: Skin is warm.  ?Neurological:  ?   Mental Status: She is alert.  ? ? ?ED Results / Procedures / Treatments   ?Labs ?(all labs ordered are listed, but only abnormal results are displayed) ?Labs Reviewed - No data to display ? ?EKG ?None ? ?Radiology ?DG Chest 2 View ? ?Result Date: 01/09/2022 ?CLINICAL DATA:  Shortness of breath. Sudden onset of chest pain and shortness of breath. History of asthma. EXAM: CHEST - 2 VIEW COMPARISON:  01/01/2022 FINDINGS: Improved cardiomegaly which may be due to differences in technique. Stable mediastinal contours. Peribronchial thickening without focal airspace disease. No pleural  effusion or pneumothorax. No evidence of pneumomediastinum. No pulmonary edema. Postsurgical change in both shoulders. IMPRESSION: Peribronchial thickening typical of asthma. Improved cardiomegaly from prior exam, which may be in part related to differences in technique. Electronically Signed   By: Keith Rake M.D.   On: 01/09/2022 16:40   ? ?Procedures ?Procedures  ? ? ?Medications Ordered in ED ?Medications  ?albuterol (VENTOLIN HFA) 108 (90 Base) MCG/ACT inhaler 2 puff (2 puffs Inhalation Given 01/09/22 1559)  ? ? ?ED Course/ Medical Decision Making/ A&P ?  ?                        ?Medical Decision Making ?Amount and/or Complexity of Data Reviewed ?Radiology: ordered. ? ?Risk ?Prescription drug management. ? ? ?  Patient with shortness of breath.  Began acutelyAfter exposure to smoke.  Feeling better after breathing treatment.  X-ray done and independently interpreted and showed no pneumonia.  EKG reassuring also.  Doubt cardiac ischemia as a cause of the pain.  Since she is feeling better and was triggered by specific trigger I do not think she would benefit from steroids at this time.  Do not feels of any pneumonia.  Also showing some mild neck pain but reviewed recent CTA showed no real abnormality in the neck.  Will discharge home with outpatient follow-up. ? ? ? ? ? ? ? ?Final Clinical Impression(s) / ED Diagnoses ?Final diagnoses:  ?Wheezing  ?Nonspecific chest pain  ? ? ?Rx / DC Orders ?ED Discharge Orders   ? ? None  ? ?  ? ? ?  ?Davonna Belling, MD ?01/09/22 1717 ? ?

## 2022-01-09 NOTE — Discharge Instructions (Signed)
Use your inhaler as needed.  X-ray is reassuring and does not show pneumonia. ?

## 2022-01-09 NOTE — ED Triage Notes (Signed)
Pt BIBA from bus stop c/o sudden onset [facility] and centralized chest pain. She reports hx of asthma. EMS reports bilateral wheezing in the lower lung fields, was given 5 mg albuterol neb tx PTA. She reports chest pain has since resolved. ? ?VS WDL. ?

## 2022-01-15 ENCOUNTER — Ambulatory Visit (INDEPENDENT_AMBULATORY_CARE_PROVIDER_SITE_OTHER): Payer: Medicare Other | Admitting: Licensed Clinical Social Worker

## 2022-01-15 DIAGNOSIS — F411 Generalized anxiety disorder: Secondary | ICD-10-CM

## 2022-01-15 DIAGNOSIS — F331 Major depressive disorder, recurrent, moderate: Secondary | ICD-10-CM | POA: Diagnosis not present

## 2022-01-15 DIAGNOSIS — F4310 Post-traumatic stress disorder, unspecified: Secondary | ICD-10-CM | POA: Diagnosis not present

## 2022-01-15 NOTE — Progress Notes (Signed)
Virtual Visit via Video Note ?  ?I connected with Meghan [name] on 01/15/22 at 2:00pm by video enabled telemedicine application and verified that I am speaking with the correct person using two identifiers. ?  ?I discussed the limitations, risks, security and privacy concerns of performing an evaluation and management service by video and the availability of in person appointments. I also discussed with the patient that there may be a patient responsible charge related to this service. The patient expressed understanding and agreed to proceed. ?  ?I discussed the assessment and treatment plan with the patient. The patient was provided an opportunity to ask questions and all were answered. The patient agreed with the plan and demonstrated an understanding of the instructions. ?  ?The patient was advised to call back or seek an in-person evaluation if the symptoms worsen or if the condition fails to improve as anticipated. ?  ?I provided 45 minutes of non-face-to-face time during this encounter. ?  ?  ?[name], LCSW, LCAS ?_______________________ ?THERAPIST PROGRESS NOTE ?  ?Session Time:  2:00pm - 2:45pm    ? ?Location: ?Patient: Patient home    ?Provider: Clinician Home Office ?  ?Participation Level: Active  ?  ?Behavioral Response: Alert, casually dressed, euthymic mood/affect ?  ?Type of Therapy:  Individual Therapy ?  ?Treatment Goals addressed: Mood management; Medication management ? ?Progress Towards Goals: Progressing ?  ?Interventions: CBT: challenging anxious thoughts  ?  ?Summary: Meghan [name] is a 56 year old divorced African American female that presented for therapy appointment today with diagnoses of Major Depressive Disorder, recurrent, moderate, Generalized Anxiety Disorder and PTSD.    ?  ?Suicidal/Homicidal: None; without plan or intent  ?                                                                                                              ?Therapist Response:  Clinician met with Meghan [name]  for virtual therapy appointment and assessed for safety, sobriety, and medication compliance.  Meghan [name] presented for session on time and spoke in a manner that was alert, oriented x5, with no evidence or self-report of active SI/HI or A/V H.  Meghan [name] reported ongoing compliance with medication and denied any use of alcohol or illicit substances.  Clinician inquired about Meghan [name]'s current emotional ratings, as well as any significant changes in thoughts, feelings, or behavior since previous check-in.  Meghan [name] reported scores of 0/10 for depression, 0/10 for anxiety, and 0/10 for irritability.  Meghan [name] also denied experiencing any recent outbursts or panic attacks.  Meghan [name] reported that following her hospitalization last week, they determined she had a major asthma attack and hit her head, but there were no major issues identified following a CT scan or followup meetings with two doctors.  Meghan [name] reported that she also went down to [place] over the weekend for her aunt's funeral, which went well overall since she got to see family.  Meghan [name] reported that one concern she has been ruminating upon is an upcoming driver's test she has on Friday.  Clinician  utilized handout in session today titled "Worry exploration" in order to assist Meghan [name] in reducing her anxiety related to upcoming driver's test.  This worksheet featured a series of Socratic questions aimed at exploring the most likely outcomes for a situation of concern, rather than focusing on the worst possible outcome (i.e. catastrophizing).  Clinician assisted Meghan [name] in identifying and challenging any irrational beliefs related to this worry, in addition to utilizing problem solving approach to explore strategies which would help her accomplish goal of passing test successfully.  Meghan [name] actively participated in discussion on handout, reporting that she is worried about failing the test if she is unable to complete specific maneuvers such as parallel parking.  Meghan [name] reported that  there is sufficient evidence to suggest she will do fine, as she has been driving her sister's car supervised for practice for some time now, and her sister has even offered additional practice ahead of Friday.  Meghan [name] acknowledged that even if she were to fail, she could always retest later once she has had more time to prepare again and address problem areas.  Intervention was effective, as evidenced by Meghan [name] reporting that discussion on this subject reduced her anxiety about upcoming driver's test, and made her more confident about successfully passing, in addition to giving her ideas on how to practice more ahead of this date to improve skills, such as going to a deserted parking lot with her sister in the evening to do various maneuvers safely, stating "I think I'm gonna have a good week".  Clinician will continue to monitor.   ?                                   ?Plan: Follow up again in 1 week.  ?  ?Diagnosis: Major depressive disorder, recurrent, moderate; Generalized Anxiety Disorder; and PTSD  ? ?Collaboration of Care:   No collaboration of care required for this visit. ?                                                  ?Patient/Guardian was advised Release of Information must be obtained prior to any record release in order to collaborate their care with an outside provider. Patient/Guardian was advised if they have not already done so to contact the registration department to sign all necessary forms in order for [name] to release information regarding their care.  ?  ?Consent: Patient/Guardian gives verbal consent for treatment and assignment of benefits for services provided during this visit. Patient/Guardian expressed understanding and agreed to proceed. ? ?[name], LCSW, LCAS ?01/15/22 ? ?

## 2022-01-24 ENCOUNTER — Other Ambulatory Visit: Payer: Self-pay

## 2022-01-24 ENCOUNTER — Encounter (HOSPITAL_COMMUNITY): Payer: Self-pay | Admitting: Orthopedic Surgery

## 2022-01-24 NOTE — Progress Notes (Signed)
TWO VISITORS ARE ALLOWED TO COME WITH YOU AND STAY IN THE SURGICAL WAITING ROOM ONLY DURING PRE OP AND PROCEDURE DAY OF SURGERY.  ? ?PCP - Arneta Cliche, FNP-C ?Cardiologist - Dr Edson Snowball ?[place], [place] ?Neurology - Dr Marcial Pacas ?Pulmonology - Geraldo Pitter, NP ? ?Chest x-ray - 01/09/22 (2V) ?EKG - 01/09/22 ?Stress Test - 2017 ?ECHO - 04/28/19 ?Cardiac Cath - 2017 ? ?ICD Pacemaker/Loop - n/a ? ?Sleep Study -  Yes ?CPAP - uses CPAP nightly ? ?Blood Thinner Instructions:  Follow your surgeon's instructions on when to stop Xarelto prior to surgery.  Last dose was on 01/20/22. ? ?ERAS: Clear liquids til 8 AM DOS ? ?Anesthesia review: Yes ? ?STOP now taking any Aspirin (unless otherwise instructed by your surgeon), Aleve, Naproxen, Ibuprofen, Motrin, Advil, Goody's, BC's, all herbal medications, fish oil, and all vitamins.  ? ?Coronavirus Screening ?Do you have any of the following symptoms:  ?Cough yes/no: No ?Fever (>100.36F)  yes/no: No ?Runny nose yes/no: No ?Sore throat yes/no: No ?Difficulty breathing/shortness of breath  yes/no: No ? ?Have you traveled in the last 14 days and where? yes/no: No ? ?Patient verbalized understanding of instructions that were given via phone. ?

## 2022-01-24 NOTE — Progress Notes (Addendum)
Anesthesia Chart Review: SAME DAY WORK-UP ? Case: [id number] Date/Time: 01/25/22 1045  ? Procedure: ARTHROSCOPY RIGHT WRIST, POSSIBLE DEBRIDEMENT/ SHRINKAGE (Right: Wrist) - 90 MIN  ? Anesthesia type: Regional  ? Pre-op diagnosis: POSSIBLE SCAPHOLUNATE TEAR RIGHT WRIST  ? [facility]: [facility] RO[facility] / [facility]  ? Surgeons: Leanora Cover, MD  ? ?  ? ? ?DISCUSSION: Patient is a 56 year old female scheduled for the above procedure. She had similar procedure on the left on 10/18/20. Appears right wrist arthroscopy was initially scheduled for 11/09/21 but was delayed while awaiting insurance approval.  ?  ?History includes never smoker, asthma, afib, CHF, PE (IVC filter 2003), OSA (CPAP), DM2, PTSD, seizure disorder (nonepileptic seizures),CVA (1989, 1995, left sided weakness), hypercholesterolemia, hiatal hernia, GERD, macular degeneration (left), depression, scapholunate tear (s/p left wrist arthroscopy 10/18/20), varicose veins (left GSV ablation/stab phlebectomies 08/31/21). BMI is consistent with morbid obesity.  ? ?Fairly frequent ED visits. Last several notes reviewed. Last ED visit 01/09/22 for SOB that started acutely after exposure to smoker. Albuterol given. EKG reassuring. CXR showed no PNA. CTA head/neck normal. ED provider doubted cardiac etiology and did not feel she required any steroids. Discharged home. ? ?[place] cardiologist Dr. Edson Snowball cleared for surgery with permission to hold Xarelto 3 days prior to surgery (and up to 5 days post-operative as necessary). Last dose is documented as 01/20/22. She is on Xarelto for PAF and PE history. ? ?Anesthesia team to evaluate on the day of surgery.  She had CBC, BMET, and EKG within the last 30 days. She was in SR. ? ? ?VS: Ht '5\' 5"'$  (1.651 m)   Wt (!) 142.9 kg   BMI 52.42 kg/m?  ?BP Readings from Last 3 Encounters:  ?01/09/22 (!) 152/77  ?01/01/22 120/76  ?12/05/21 (!) 105/59  ? ?Pulse Readings from Last 3 Encounters:  ?01/09/22 75  ?01/01/22 70  ?12/05/21 (!) 57  ?   ? ?PROVIDERS: ?[place], Higinio Roger, FNP is PCP ([place]) ?Tonia Ghent, MD is cardiologist [facility]), previously saw Marton Redwood, MD there. Previously, she saw Celine Ahr, MD with [facility] Moved to [place] from [place] about 3 years ago.  ?- Chesley Mires, MD is pulmonologist. Last visit 11/16/21 with Geraldo Pitter, NP. Previously she saw Gwenevere Ghazi, MD [facility]) and Dionne Milo, MD with [facility]. ?- Marcial Pacas, MD is neurologist [facility]) ?Servando Snare, MD is vascular surgeon ?  ? ?LABS: Last results in [facility] include: ?Lab Results  ?Component Value Date  ? WBC 9.0 01/01/2022  ? HGB 10.3 (L) 01/01/2022  ? HCT 33.6 (L) 01/01/2022  ? PLT 235 01/01/2022  ? GLUCOSE 93 01/01/2022  ? ALT 14 11/27/2021  ? AST 12 (L) 11/27/2021  ? NA 140 01/01/2022  ? K 4.4 01/01/2022  ? CL 108 01/01/2022  ? CREATININE 0.76 01/01/2022  ? BUN 17 01/01/2022  ? CO2 27 01/01/2022  ? ? ?IMAGES: ?MR Arthrogram right wrist 10/11/21 ([facility]): ?IMPRESSION:  ?1. Small full-thickness tear of the scapholunate ligament.  ?2. Intact TFCC.  ? ? ?IMAGES: ?CXR 01/09/22: ?FINDINGS: ?Improved cardiomegaly which may be due to differences in technique. ?Stable mediastinal contours. Peribronchial thickening without focal ?airspace disease. No pleural effusion or pneumothorax. No evidence ?of pneumomediastinum. No pulmonary edema. Postsurgical change in ?both shoulders. ?  ?CTA Head/Neck 01/09/22: ?IMPRESSION: ?Normal CTA of the head and neck. ?  ?   ?EKG: 01/09/22: ?Sinus rhythm ?Prolonged PR interval [  PR 227 ms] ?Borderline T abnormalities, anterior leads ?Confirmed by Wynona Dove ([id number]) on 01/11/2022 10:09:23 AM ?  ?CV:  ?Per [place] records received prior to 10/18/20 surgery: ? ?Echo 08/16/20:   ?"Moderate concentric left ventricular hypertrophy with septum 1.4 cm and basal inferior lateral wall 1.3 cm.  Left atrium was mildly dilated at 38  mL/m with normal right ventricular size and function.  Ejection fraction was 65% with pulmonary artery pressure estimated at 32 mmHg." ? ?She reported had a cath in 2014 or 2018. (According to 03/10/20 [facility] cardiology office note by Dr. Shirlee More: ?"Echocardiogram 04/2019. Normal LV EF 60-65%. Moderate 2+ tricuspid regurgitation.Marland KitchenMarland KitchenNo history of coronary artery disease; post normal coronary angiogram in 2018" at [facility]. ?  ? ?Past Medical History:  ?Diagnosis Date  ? Anxiety   ? Arthritis   ? left knee  ? Asthma   ? Atrial fibrillation ([place]) 02/16/2019  ? CHF (congestive heart failure) ([place]) 01/2019  ? Physician from [place] informed her she had this   ? Diabetes mellitus without complication ([place])   ? tx with rybelsus  ? Dysrhythmia   ? Atrial fibrillation  ? GERD (gastroesophageal reflux disease)   ? Hiatal hernia   ? History of pulmonary embolus (PE)   ? Hypercholesterolemia   ? Macular degeneration   ? of left eye  ? MDD (major depressive disorder)   ? Osteoarthritis   ? knees  ? Pneumonia   ? in the past  ? Pre-diabetes   ? PTSD (post-traumatic stress disorder)   ? Pulmonary embolism ([place])   ? bilat  ? Seizure disorder ([place])   ? last seizure 07/19/21, pt had a fall  ? Sleep apnea CPAP  ? [employer]  ? Stroke [facility])   ? 1989 and 1995 (left sided weakness)  ? Vitamin D deficiency   ? ? ?Past Surgical History:  ?Procedure Laterality Date  ? ABDOMINAL HYSTERECTOMY  2003  ? complete  ? CARDIAC CATHETERIZATION  2017  ? in [facility] in [place]  ? CESAREAN SECTION  [phone]  ? x 2  ? CESAREAN SECTION    ? x 2  ? CHOLECYSTECTOMY  2003  ? COLONOSCOPY WITH PROPOFOL N/A 06/05/2019  ? Procedure: COLONOSCOPY WITH PROPOFOL;  Surgeon: Carol Ada, MD;  Location: [facility];  Service: Endoscopy;  Laterality: N/A;  ? CYST REMOVAL WITH BONE GRAFT Left 10/18/2020  ? Procedure: BONE GRAFTING OF ENCHONDROMA MIDDLE [place] OF LEFT MIDDLE FINGER;  Surgeon: Daryll Brod, MD;  Location: [place];   Service: Orthopedics;  Laterality: Left;  AXILLARY BLOCK  ? DIAGNOSTIC LAPAROSCOPY  2015; 2017  ? lap hernia repair x2  ? ENDOVENOUS ABLATION SAPHENOUS VEIN W/ LASER Left 08/31/2021  ? endovenous laser ablation left greater saphenous vein and stab phlebectomy 10-20 incisions left leg by Servando Snare MD  ? HERNIA REPAIR  [id number], [id number]  ? umbilical hernia repair  ? IVC FILTER INSERTION  2003  ? Hx pulmonary embolus  ? POLYPECTOMY  06/05/2019  ? Procedure: POLYPECTOMY;  Surgeon: Carol Ada, MD;  Location: [facility];  Service: Endoscopy;;  ? SHOULDER ARTHROSCOPY W/ ROTATOR CUFF REPAIR  08/28/2017  ? right shoulder  ? WRIST ARTHROSCOPY WITH DEBRIDEMENT Left 10/18/2020  ? Procedure: LEFT WRIST ARTHROSCOPY WITH DEBRIDEMENT;  Surgeon: Daryll Brod, MD;  Location: [place];  Service: Orthopedics;  Laterality: Left;  AXILLARY BLOCK  ? ? ?MEDICATIONS: ?No current facility-administered medications for this encounter.  ? ? acetaminophen (TYLENOL) 500 MG tablet  ?  albuterol (VENTOLIN HFA) 108 (90 Base) MCG/ACT inhaler  ? atorvastatin (LIPITOR) 40 MG tablet  ? BREZTRI AEROSPHERE 160-9-4.8 MCG/ACT AERO  ? bumetanide (BUMEX) 1 MG tablet  ? Cholecalciferol (VITAMIN D-3) 25 MCG (1000 UT) CAPS  ? flecainide (TAMBOCOR) 50 MG tablet  ? ketoconazole (NIZORAL) 2 % cream  ? levETIRAcetam (KEPPRA) 500 MG tablet  ? metFORMIN (GLUCOPHAGE) 500 MG tablet  ? metoCLOPramide (REGLAN) 10 MG tablet  ? metoprolol succinate (TOPROL-XL) 25 MG 24 hr tablet  ? Omega-3 Fatty Acids (FISH OIL) 1000 MG CAPS  ? omeprazole (PRILOSEC) 40 MG capsule  ? ondansetron (ZOFRAN-ODT) 4 MG disintegrating tablet  ? rivaroxaban (XARELTO) 20 MG TABS tablet  ? RYBELSUS 14 MG TABS  ? sertraline (ZOLOFT) 50 MG tablet  ? ? ?Myra Gianotti, PA-C ?Surgical Short Stay/Anesthesiology ?[facility] Phone [phone] ?[facility] Phone [phone] ?01/24/2022 11:52 AM ? ? ? ? ? ? ? ?

## 2022-01-24 NOTE — Anesthesia Preprocedure Evaluation (Addendum)
Anesthesia Evaluation  ?Patient identified by MRN, date of birth, ID band ?Patient awake ? ? ? ?Reviewed: ?Allergy & Precautions, H&P , NPO status , Patient's Chart, lab work & pertinent test results ? ?Airway ?Mallampati: II ? ? ?Neck ROM: full ? ? ? Dental ?  ?Pulmonary ?asthma , sleep apnea ,  ?  ?breath sounds clear to auscultation ? ? ? ? ? ? Cardiovascular ?+CHF  ?+ dysrhythmias Atrial Fibrillation  ?Rhythm:regular Rate:Normal ? ? ?  ?Neuro/Psych ?Seizures -,  PSYCHIATRIC DISORDERS Anxiety Depression  Neuromuscular disease CVA, Residual Symptoms   ? GI/Hepatic ?GERD  ,  ?Endo/Other  ?diabetes, Type 2Morbid obesity ? Renal/GU ?  ? ?  ?Musculoskeletal ? ?(+) Arthritis ,  ? Abdominal ?  ?Peds ? Hematology ?  ?Anesthesia Other Findings ? ? Reproductive/Obstetrics ? ?  ? ? ? ? ? ? ? ? ? ? ? ? ? ?  ?  ? ? ? ? ? ? ? ?Anesthesia Physical ?Anesthesia Plan ? ?ASA: 3 ? ?Anesthesia Plan: General  ? ?Post-op Pain Management: Regional block*  ? ?Induction: Intravenous ? ?PONV Risk Score and Plan: 3 and Ondansetron, Dexamethasone, Treatment may vary due to age or medical condition and Midazolam ? ?Airway Management Planned: LMA ? ?Additional Equipment:  ? ?Intra-op Plan:  ? ?Post-operative Plan: Extubation in OR ? ?Informed Consent: I have reviewed the patients History and Physical, chart, labs and discussed the procedure including the risks, benefits and alternatives for the proposed anesthesia with the patient or authorized representative who has indicated his/her understanding and acceptance.  ? ? ? ?Dental advisory given ? ?Plan Discussed with: CRNA, Anesthesiologist and Surgeon ? ?Anesthesia Plan Comments: (PAT note written 01/24/2022 by Myra Gianotti, PA-C. ?)  ? ? ? ? ? ?Anesthesia Quick Evaluation ? ?

## 2022-01-25 ENCOUNTER — Encounter (HOSPITAL_COMMUNITY)
Admission: RE | Disposition: A | Discharge status: Home / Self Care | Payer: Self-pay | Source: Home / Self Care | Attending: Orthopedic Surgery

## 2022-01-25 ENCOUNTER — Ambulatory Visit (HOSPITAL_BASED_OUTPATIENT_CLINIC_OR_DEPARTMENT_OTHER): Payer: Medicare Other | Admitting: Anesthesiology

## 2022-01-25 ENCOUNTER — Other Ambulatory Visit: Payer: Self-pay

## 2022-01-25 ENCOUNTER — Encounter (HOSPITAL_COMMUNITY): Payer: Self-pay | Admitting: Orthopedic Surgery

## 2022-01-25 ENCOUNTER — Ambulatory Visit (HOSPITAL_COMMUNITY): Payer: Medicare Other | Admitting: Anesthesiology

## 2022-01-25 ENCOUNTER — Ambulatory Visit (HOSPITAL_COMMUNITY)
Admission: RE | Admit: 2022-01-25 | Discharge: 2022-01-25 | Disposition: A | Discharge status: Home / Self Care | Payer: Medicare Other | Attending: Orthopedic Surgery | Admitting: Orthopedic Surgery

## 2022-01-25 DIAGNOSIS — R569 Unspecified convulsions: Secondary | ICD-10-CM | POA: Diagnosis not present

## 2022-01-25 DIAGNOSIS — Z7984 Long term (current) use of oral hypoglycemic drugs: Secondary | ICD-10-CM | POA: Diagnosis not present

## 2022-01-25 DIAGNOSIS — E119 Type 2 diabetes mellitus without complications: Secondary | ICD-10-CM

## 2022-01-25 DIAGNOSIS — S63511A Sprain of carpal joint of right wrist, initial encounter: Secondary | ICD-10-CM | POA: Diagnosis not present

## 2022-01-25 DIAGNOSIS — I4891 Unspecified atrial fibrillation: Secondary | ICD-10-CM | POA: Insufficient documentation

## 2022-01-25 DIAGNOSIS — Z8673 Personal history of transient ischemic attack (TIA), and cerebral infarction without residual deficits: Secondary | ICD-10-CM | POA: Insufficient documentation

## 2022-01-25 DIAGNOSIS — S63591A Other specified sprain of right wrist, initial encounter: Secondary | ICD-10-CM | POA: Insufficient documentation

## 2022-01-25 DIAGNOSIS — I509 Heart failure, unspecified: Secondary | ICD-10-CM | POA: Insufficient documentation

## 2022-01-25 DIAGNOSIS — J45909 Unspecified asthma, uncomplicated: Secondary | ICD-10-CM | POA: Insufficient documentation

## 2022-01-25 DIAGNOSIS — G4730 Sleep apnea, unspecified: Secondary | ICD-10-CM | POA: Insufficient documentation

## 2022-01-25 DIAGNOSIS — K219 Gastro-esophageal reflux disease without esophagitis: Secondary | ICD-10-CM | POA: Diagnosis not present

## 2022-01-25 DIAGNOSIS — Z6841 Body Mass Index (BMI) 40.0 and over, adult: Secondary | ICD-10-CM | POA: Diagnosis not present

## 2022-01-25 DIAGNOSIS — X58XXXA Exposure to other specified factors, initial encounter: Secondary | ICD-10-CM | POA: Insufficient documentation

## 2022-01-25 HISTORY — DX: Major depressive disorder, single episode, unspecified: F32.9

## 2022-01-25 HISTORY — DX: Type 2 diabetes mellitus without complications: E11.9

## 2022-01-25 HISTORY — DX: Anxiety disorder, unspecified: F41.9

## 2022-01-25 HISTORY — PX: WRIST ARTHROSCOPY WITH DEBRIDEMENT: SHX6194

## 2022-01-25 HISTORY — DX: Post-traumatic stress disorder, unspecified: F43.10

## 2022-01-25 LAB — GLUCOSE, CAPILLARY
Glucose-Capillary: 86 mg/dL (ref 70–99)
Glucose-Capillary: 71 mg/dL (ref 70–99)
Glucose-Capillary: 73 mg/dL (ref 70–99)

## 2022-01-25 SURGERY — WRIST ARTHROSCOPY WITH DEBRIDEMENT
Anesthesia: General | Site: Wrist | Laterality: Right

## 2022-01-25 MED ORDER — FENTANYL CITRATE (PF) 100 MCG/2ML IJ SOLN
INTRAMUSCULAR | Status: AC
  Administered 2022-01-25: 50 ug via INTRAVENOUS
  Filled 2022-01-25: qty 2

## 2022-01-25 MED ORDER — LACTATED RINGERS IV SOLN: INTRAVENOUS | Status: DC

## 2022-01-25 MED ORDER — ORAL CARE MOUTH RINSE: 15.0000 mL | Freq: Once | OROMUCOSAL | Status: AC

## 2022-01-25 MED ORDER — SODIUM CHLORIDE 0.9 % IR SOLN
Status: DC | PRN
  Administered 2022-01-25: 1000 mL

## 2022-01-25 MED ORDER — PROPOFOL 10 MG/ML IV BOLUS
INTRAVENOUS | Status: DC | PRN
  Administered 2022-01-25 (×2): 20 mg via INTRAVENOUS

## 2022-01-25 MED ORDER — CEFAZOLIN IN SODIUM CHLORIDE 3-0.9 GM/100ML-% IV SOLN
3.0000 g | INTRAVENOUS | Status: AC
  Administered 2022-01-25: 3 g via INTRAVENOUS
  Filled 2022-01-25: qty 100

## 2022-01-25 MED ORDER — OXYCODONE HCL 5 MG PO TABS: 5.0000 mg | ORAL_TABLET | Freq: Once | ORAL | Status: DC | PRN

## 2022-01-25 MED ORDER — ROPIVACAINE HCL 5 MG/ML IJ SOLN
INTRAMUSCULAR | Status: DC | PRN
  Administered 2022-01-25: 30 mL via PERINEURAL

## 2022-01-25 MED ORDER — OXYCODONE HCL 5 MG PO TABS
ORAL_TABLET | ORAL | 0 refills | Status: DC
Start: 2022-01-25 — End: 2022-05-28

## 2022-01-25 MED ORDER — ONDANSETRON HCL 4 MG/2ML IJ SOLN: 4.0000 mg | Freq: Four times a day (QID) | INTRAMUSCULAR | Status: DC | PRN

## 2022-01-25 MED ORDER — CHLORHEXIDINE GLUCONATE 0.12 % MT SOLN
OROMUCOSAL | Status: AC
  Administered 2022-01-25: 15 mL via OROMUCOSAL
  Filled 2022-01-25: qty 15

## 2022-01-25 MED ORDER — PHENYLEPHRINE 80 MCG/ML (10ML) SYRINGE FOR IV PUSH (FOR BLOOD PRESSURE SUPPORT)
PREFILLED_SYRINGE | INTRAVENOUS | Status: DC | PRN
  Administered 2022-01-25 (×2): 80 ug via INTRAVENOUS

## 2022-01-25 MED ORDER — INSULIN ASPART 100 UNIT/ML IJ SOLN: 0.0000 [IU] | INTRAMUSCULAR | Status: DC | PRN

## 2022-01-25 MED ORDER — ONDANSETRON HCL 4 MG/2ML IJ SOLN
INTRAMUSCULAR | Status: DC | PRN
Start: 2022-01-25 — End: 2022-01-25
  Administered 2022-01-25: 4 mg via INTRAVENOUS

## 2022-01-25 MED ORDER — FENTANYL CITRATE (PF) 100 MCG/2ML IJ SOLN: 50.0000 ug | Freq: Once | INTRAMUSCULAR | Status: AC

## 2022-01-25 MED ORDER — CHLORHEXIDINE GLUCONATE 0.12 % MT SOLN: 15.0000 mL | Freq: Once | OROMUCOSAL | Status: AC

## 2022-01-25 MED ORDER — OXYCODONE HCL 5 MG/5ML PO SOLN: 5.0000 mg | Freq: Once | ORAL | Status: DC | PRN

## 2022-01-25 MED ORDER — FENTANYL CITRATE (PF) 100 MCG/2ML IJ SOLN: 25.0000 ug | INTRAMUSCULAR | Status: DC | PRN

## 2022-01-25 MED ORDER — MIDAZOLAM HCL 2 MG/2ML IJ SOLN
INTRAMUSCULAR | Status: AC
  Filled 2022-01-25: qty 2

## 2022-01-25 MED ORDER — PROPOFOL 500 MG/50ML IV EMUL
INTRAVENOUS | Status: DC | PRN
  Administered 2022-01-25: 75 ug/kg/min via INTRAVENOUS

## 2022-01-25 SURGICAL SUPPLY — 57 items
BAG COUNTER SPONGE SURGICOUNT (BAG) ×2 IMPLANT
BENZOIN TINCTURE PRP APPL 2/3 (GAUZE/BANDAGES/DRESSINGS) IMPLANT
BLADE 11 SAFETY STRL DISP (BLADE) ×1 IMPLANT
BLADE EAR TYMPAN 2.5 60D BEAV (BLADE) IMPLANT
BNDG COHESIVE 3X5 TAN STRL LF (GAUZE/BANDAGES/DRESSINGS) ×2 IMPLANT
BNDG ELASTIC 3X5.8 VLCR STR LF (GAUZE/BANDAGES/DRESSINGS) ×1 IMPLANT
BNDG ESMARK 4X9 LF (GAUZE/BANDAGES/DRESSINGS) ×1 IMPLANT
BNDG GAUZE ELAST 4 BULKY (GAUZE/BANDAGES/DRESSINGS) ×2 IMPLANT
CANISTER SUCT 3000ML PPV (MISCELLANEOUS) IMPLANT
CANISTER SUCT LVC 12 LTR MEDI- (MISCELLANEOUS) IMPLANT
CORD BIPOLAR FORCEPS 12FT (ELECTRODE) IMPLANT
COVER BACK TABLE 60X90IN (DRAPES) ×2 IMPLANT
DRAPE OEC MINIVIEW 54X84 (DRAPES) IMPLANT
DRAPE SURG 17X23 STRL (DRAPES) ×2 IMPLANT
DRSG KUZMA FLUFF (GAUZE/BANDAGES/DRESSINGS) ×2 IMPLANT
GAUZE 4X4 16PLY ~~LOC~~+RFID DBL (SPONGE) ×1 IMPLANT
GAUZE SPONGE 4X4 12PLY STRL (GAUZE/BANDAGES/DRESSINGS) ×2 IMPLANT
GAUZE XEROFORM 1X8 LF (GAUZE/BANDAGES/DRESSINGS) ×2 IMPLANT
GLOVE BIO SURGEON STRL SZ7.5 (GLOVE) ×2 IMPLANT
GLOVE BIOGEL PI IND STRL 8 (GLOVE) ×1 IMPLANT
GLOVE BIOGEL PI INDICATOR 8 (GLOVE) ×1
GOWN STRL REUS W/ TWL LRG LVL3 (GOWN DISPOSABLE) ×3 IMPLANT
GOWN STRL REUS W/ TWL XL LVL3 (GOWN DISPOSABLE) ×1 IMPLANT
GOWN STRL REUS W/TWL LRG LVL3 (GOWN DISPOSABLE) ×3
GOWN STRL REUS W/TWL XL LVL3 (GOWN DISPOSABLE) ×1
KIT BASIN OR (CUSTOM PROCEDURE TRAY) ×2 IMPLANT
KIT TURNOVER KIT B (KITS) ×2 IMPLANT
NDL 18GX1X1/2 (RX/OR ONLY) (NEEDLE) ×1 IMPLANT
NEEDLE 18GX1X1/2 (RX/OR ONLY) (NEEDLE) ×4 IMPLANT
NEEDLE HYPO 22GX1.5 SAFETY (NEEDLE) ×1 IMPLANT
NS IRRIG 1000ML POUR BTL (IV SOLUTION) IMPLANT
PACK ORTHO EXTREMITY (CUSTOM PROCEDURE TRAY) ×1 IMPLANT
PAD ARMBOARD 7.5X6 YLW CONV (MISCELLANEOUS) ×4 IMPLANT
PAD CAST 3X4 CTTN HI CHSV (CAST SUPPLIES) ×1 IMPLANT
PAD CAST 4YDX4 CTTN HI CHSV (CAST SUPPLIES) IMPLANT
PADDING CAST ABS 3INX4YD NS (CAST SUPPLIES) ×1
PADDING CAST ABS 4INX4YD NS (CAST SUPPLIES) ×1
PADDING CAST ABS COTTON 3X4 (CAST SUPPLIES) ×1 IMPLANT
PADDING CAST ABS COTTON 4X4 ST (CAST SUPPLIES) ×1 IMPLANT
PADDING CAST COTTON 3X4 STRL (CAST SUPPLIES) ×1
PADDING CAST COTTON 4X4 STRL (CAST SUPPLIES) ×1
SHAVER DISSECTOR 3.0 (BURR) ×1 IMPLANT
SLING ARM FOAM STRAP XLG (SOFTGOODS) ×1 IMPLANT
SPLINT PLASTER CAST XFAST 3X15 (CAST SUPPLIES) IMPLANT
SPLINT PLASTER EXTRA FAST 3X15 (CAST SUPPLIES) ×1
SPLINT PLASTER GYPS XFAST 3X15 (CAST SUPPLIES) IMPLANT
SPLINT PLASTER XTRA FASTSET 3X (CAST SUPPLIES)
STOCKINETTE 4X48 STRL (DRAPES) ×2 IMPLANT
STRIP CLOSURE SKIN 1/2X4 (GAUZE/BANDAGES/DRESSINGS) IMPLANT
SUT ETHILON 4 0 PS 2 18 (SUTURE) ×1 IMPLANT
SYR 20ML LL LF (SYRINGE) ×2 IMPLANT
SYR CONTROL 10ML LL (SYRINGE) ×1 IMPLANT
TUBE CONNECTING 12X1/4 (SUCTIONS) ×1 IMPLANT
TUBING ARTHROSCOPY IRRIG 16FT (MISCELLANEOUS) ×1 IMPLANT
UNDERPAD 30X36 HEAVY ABSORB (UNDERPADS AND DIAPERS) ×2 IMPLANT
WAND 1.5 MICROBLATOR (SURGICAL WAND) ×1 IMPLANT
WATER STERILE IRR 1000ML POUR (IV SOLUTION) ×2 IMPLANT

## 2022-01-25 NOTE — Discharge Instructions (Signed)

## 2022-01-25 NOTE — Anesthesia Procedure Notes (Addendum)
Anesthesia Regional Block: Supraclavicular block  ? ?Pre-Anesthetic Checklist: , timeout performed,  Correct Patient, Correct Site, Correct Laterality,  Correct Procedure, Correct Position, site marked,  Risks and benefits discussed,  Surgical consent,  Pre-op evaluation,  At surgeon's request and post-op pain management ? ?Laterality: Right ? ?Prep: chloraprep     ?  ?Needles:  ?Injection technique: Single-shot ? ?Needle Type: Echogenic Stimulator Needle   ? ? ?Needle Length: 5cm  ?Needle Gauge: 22  ? ? ? ?Additional Needles: ? ? ?Procedures:, nerve stimulator,,,,,    ? ?Nerve Stimulator or Paresthesia:  ?Response: biceps flexion, 0.45 mA ? ?Additional Responses:  ? ?Narrative:  ?Start time: 01/25/2022 10:55 AM ?End time: 01/25/2022 11:05 AM ?Injection made incrementally with aspirations every 5 mL. ? ?Performed by: Personally  ?Anesthesiologist: Albertha Ghee, MD ? ?Additional Notes: ?Functioning IV was confirmed and monitors were applied.  A 73m 22ga Arrow echogenic stimulator needle was used. Sterile prep and drape,hand hygiene and sterile gloves were used.  Negative aspiration and negative test dose prior to incremental administration of local anesthetic. The patient tolerated the procedure well. ? ?Ultrasound guidance: relevent anatomy identified, needle position confirmed, local anesthetic spread visualized around nerve(s), vascular puncture avoided.  Image printed for medical record.  ? ? ? ? ?

## 2022-01-25 NOTE — Op Note (Signed)
I assisted Surgeon(s) and Role: ?   Leanora Cover, MD - Primary ?   Daryll Brod, MD - Assisting on the Procedure(s): ?ARTHROSCOPY RIGHT WRIST WITH  DEBRIDEMENT/ SHRINKAGE on 01/25/2022.  I provided assistance on this case as follows: Set up, placement of the patient in the arthroscopy tower, establishment of portals, identification of bones to the carpal bones and injury to the scapholunate ligament complex, debridement shrinkage of the scapholunate ligament infection of the midcarpal joint closure of the wounds and application of the dressings and splints. ? ?Electronically signed by: Daryll Brod, MD ?Date: 01/25/2022 Time: 12:47 PM  ?

## 2022-01-25 NOTE — Transfer of Care (Signed)
Immediate Anesthesia Transfer of Care Note ? ?Patient: Meghan [name] ? ?Procedure(s) Performed: ARTHROSCOPY RIGHT WRIST WITH  DEBRIDEMENT/ SHRINKAGE (Right: Wrist) ? ?Patient Location: PACU ? ?Anesthesia Type:MAC combined with regional for post-op pain ? ?Level of Consciousness: awake, alert  and oriented ? ?Airway & Oxygen Therapy: Patient Spontanous Breathing and Patient connected to face mask oxygen ? ?Post-op Assessment: Report given to RN and Post -op Vital signs reviewed and stable ? ?Post vital signs: Reviewed and stable ? ?Last Vitals:  ?Vitals Value Taken Time  ?BP 113/77 01/25/22 1252  ?Temp    ?Pulse 62 01/25/22 1252  ?Resp 20 01/25/22 1252  ?SpO2 96 % 01/25/22 1252  ?Vitals shown include unvalidated device data. ? ?Last Pain:  ?Vitals:  ? 01/25/22 0854  ?TempSrc:   ?PainSc: 3   ?   ? ?Patients Stated Pain Goal: 0 (01/25/22 0854) ? ?Complications: No notable events documented. ?

## 2022-01-25 NOTE — H&P (Signed)
Meghan [name] is an 56 y.o. female.   ?Chief Complaint: right wrist pain ?HPI: 56 yo female with right wrist pain.  MRI shows partial tear of SL ligament.  She has tried splinting without relief.  She wishes to proceed with arthroscopy with debridement vs shrinkage. ? ?Allergies:  ?Allergies  ?Allergen Reactions  ? Acetaminophen-Codeine Swelling, Rash and Other (See Comments)  ?  Tylenol with Codeine, Tylenol #3,  ?(facial swelling, hives)  ? Meloxicam Anaphylaxis and Rash  ? Tramadol Hives and Other (See Comments)  ?  Patient stated "I was trippin' and I do not want that ever again"  ? Codeine Swelling  ?  hives  ? Coconut Oil Rash and Other (See Comments)  ?  ANY coconut products   ? Tomato Rash  ?  Fresh   ? ? ?Past Medical History:  ?Diagnosis Date  ? Anxiety   ? Arthritis   ? left knee  ? Asthma   ? Atrial fibrillation ([place]) 02/16/2019  ? CHF (congestive heart failure) ([place]) 01/2019  ? Physician from [place] informed her she had this   ? Diabetes mellitus without complication ([place])   ? Dysrhythmia   ? Atrial fibrillation  ? GERD (gastroesophageal reflux disease)   ? Hiatal hernia   ? History of pulmonary embolus ([place])   ? Hypercholesterolemia   ? Macular degeneration   ? of left eye  ? MDD (major depressive disorder)   ? Osteoarthritis   ? knees  ? Pneumonia   ? in the past  ? PTSD (post-traumatic stress disorder)   ? Pulmonary embolism ([place])   ? bilateral, none since 2003  ? Seizure disorder ([place])   ? Per patient last seizure was in 2020.  ? Sleep apnea CPAP  ? [employer]  Uses CPAP  ? Stroke [facility])   ? 1989 and 1995 (left sided weakness)  ? Vitamin D deficiency   ? ? ?Past Surgical History:  ?Procedure Laterality Date  ? ABDOMINAL HYSTERECTOMY  2003  ? complete  ? CARDIAC CATHETERIZATION  2017  ? in [facility] in [place]  ? CESAREAN SECTION  [id number]  ? x 2  ? CHOLECYSTECTOMY  2003  ? COLONOSCOPY WITH PROPOFOL N/A 06/05/2019  ? Procedure: COLONOSCOPY WITH PROPOFOL;  Surgeon: Carol Ada, MD;  Location: [facility]  ENDOSCOPY;  Service: Endoscopy;  Laterality: N/A;  ? CYST REMOVAL WITH BONE GRAFT Left 10/18/2020  ? Procedure: BONE GRAFTING OF ENCHONDROMA MIDDLE [place] OF LEFT MIDDLE FINGER;  Surgeon: Daryll Brod, MD;  Location: [facility];  Service: Orthopedics;  Laterality: Left;  AXILLARY BLOCK  ? DIAGNOSTIC LAPAROSCOPY  2015; 2017  ? lap hernia repair x2  ? endoscopy    ? ENDOVENOUS ABLATION SAPHENOUS VEIN W/ LASER Left 08/31/2021  ? endovenous laser ablation left greater saphenous vein and stab phlebectomy 10-20 incisions left leg by Servando Snare MD  ? HERNIA REPAIR  [id number]  ? umbilical hernia repair  ? IVC FILTER INSERTION  2003  ? Hx pulmonary embolus  ? POLYPECTOMY  06/05/2019  ? Procedure: POLYPECTOMY;  Surgeon: Carol Ada, MD;  Location: [facility] ENDOSCOPY;  Service: Endoscopy;;  ? SHOULDER ARTHROSCOPY W/ ROTATOR CUFF REPAIR  08/28/2017  ? right shoulder  ? WRIST ARTHROSCOPY WITH DEBRIDEMENT Left 10/18/2020  ? Procedure: LEFT WRIST ARTHROSCOPY WITH DEBRIDEMENT;  Surgeon: Daryll Brod, MD;  Location: [facility];  Service: Orthopedics;  Laterality: Left;  AXILLARY BLOCK  ? ? ?Family History: ?Family History  ?Problem Relation Age of Onset  ?  Breast cancer Mother   ? Breast cancer Maternal Grandmother   ? Heart attack Father   ? Diabetes Father   ? Congestive Heart Failure Father   ? ? ?Social History:  ? reports that she has never smoked. She has never used smokeless tobacco. She reports that she does not drink alcohol and does not use drugs. ? ?Medications: ?Medications Prior to Admission  ?Medication Sig Dispense Refill  ? albuterol (VENTOLIN HFA) 108 (90 Base) MCG/ACT inhaler Inhale 2 puffs into the lungs every 6 (six) hours as needed for wheezing or shortness of breath.    ? atorvastatin (LIPITOR) 40 MG tablet Take 40 mg by mouth every evening.    ? BREZTRI AEROSPHERE 160-9-4.8 MCG/ACT AERO Inhale 2 puffs into the lungs in the morning and at bedtime.     ? bumetanide (BUMEX) 1 MG tablet Take 1 mg by mouth daily.    ?  Cholecalciferol (VITAMIN D-3) 25 MCG (1000 UT) CAPS Take 1,000 Units by mouth daily.    ? flecainide (TAMBOCOR) 50 MG tablet Take 50 mg by mouth 2 (two) times daily.     ? ketoconazole (NIZORAL) 2 % cream Apply to bottoms of feet daily for 4 weeks. (Patient taking differently: 1 application. in the morning, at noon, and at bedtime.) 60 g 4  ? levETIRAcetam (KEPPRA) 500 MG tablet Take 1 tablet (500 mg total) by mouth 2 (two) times daily. 180 tablet 3  ? metFORMIN (GLUCOPHAGE) 500 MG tablet Take 500 mg by mouth daily with breakfast.    ? metoCLOPramide (REGLAN) 10 MG tablet Take 1 tablet (10 mg total) by mouth every 6 (six) hours as needed for nausea (nausea/headache). 10 tablet 0  ? metoprolol succinate (TOPROL-XL) 25 MG 24 hr tablet Take 25 mg by mouth daily.    ? Omega-3 Fatty Acids (FISH OIL) 1000 MG CAPS Take 1,000 mg by mouth daily with breakfast.     ? omeprazole (PRILOSEC) 40 MG capsule Take 40 mg by mouth daily.    ? ondansetron (ZOFRAN-ODT) 4 MG disintegrating tablet Take 1 tablet (4 mg total) by mouth every 8 (eight) hours as needed for nausea or vomiting. 20 tablet 0  ? rivaroxaban (XARELTO) 20 MG TABS tablet Take 20 mg by mouth at bedtime.     ? RYBELSUS 14 MG TABS Take 14 mg by mouth daily.    ? sertraline (ZOLOFT) 50 MG tablet Take 50 mg by mouth daily.    ? acetaminophen (TYLENOL) 500 MG tablet Take 1,000 mg by mouth every 6 (six) hours as needed for moderate pain.    ? ? ?Results for orders placed or performed during the hospital encounter of 01/25/22 (from the past 48 hour(s))  ?Glucose, capillary     Status: None  ? Collection Time: 01/25/22  8:39 AM  ?Result Value Ref Range  ? Glucose-Capillary 86 70 - 99 mg/dL  ?  Comment: Glucose reference range applies only to samples taken after fasting for at least 8 hours.  ?Glucose, capillary     Status: None  ? Collection Time: 01/25/22 10:57 AM  ?Result Value Ref Range  ? Glucose-Capillary 71 70 - 99 mg/dL  ?  Comment: Glucose reference range applies only  to samples taken after fasting for at least 8 hours.  ? ? ?No results found. ? ? ? ?Blood pressure 120/79, pulse (!) 59, temperature 97.6 ?F (36.4 ?C), temperature source Oral, resp. rate 17, height '5\' 5"'$  (1.651 m), weight 128.4 kg, SpO2 100 %. ? ?  General appearance: alert, cooperative, and appears stated age ?Head: Normocephalic, without obvious abnormality, atraumatic ?Neck: supple, symmetrical, trachea midline ?Extremities: intact capillary refill all digits. ?Pulses: 2+ and symmetric ?Skin: Skin color, texture, turgor normal. No rashes or lesions ?Neurologic: Grossly normal ?Incision/Wound: none ? ?Assessment/Plan ?Right wrist SL ligament tear.  Plan arthroscopy with debridement vs shrinkage.  Risks, benefits and alternatives of surgery were discussed including risks of blood loss, infection, damage to nerves/vessels/tendons/ligament/bone, failure of surgery, need for additional surgery, complication with wound healing, stiffness.  She voiced understanding of these risks and elected to proceed.   ? ?Leanora Cover ?01/25/2022, 11:26 AM ? ? ?

## 2022-01-25 NOTE — Op Note (Signed)
NAME: Meghan [name] ?MEDICAL RECORD NO: [id number] ?DATE OF BIRTH: 10-Apr-1966 ?FACILITY: [facility] ?LOCATION: [facility] OR ?PHYSICIAN: Tennis Must, MD ?  ?OPERATIVE REPORT ?  ?DATE OF PROCEDURE: 01/25/22  ?  ?PREOPERATIVE DIAGNOSIS: Right scapholunate ligament tear ?  ?POSTOPERATIVE DIAGNOSIS: Right scapholunate ligament tear ?  ?PROCEDURE: Right wrist arthroscopy with debridement and thermal shrinkage of scapholunate ligament ?  ?SURGEON:  Meghan [name], M.D. ?  ?ASSISTANT: Daryll Brod, MD ?  ?ANESTHESIA:  Regional with sedation ?  ?INTRAVENOUS FLUIDS:  Per anesthesia flow sheet. ?  ?ESTIMATED BLOOD LOSS:  Minimal. ?  ?COMPLICATIONS:  None. ?  ?SPECIMENS:  none ?  ?TOURNIQUET TIME:  None ?  ?DISPOSITION:  Stable to PACU. ?  ?INDICATIONS: 56 year old female with right wrist pain.  She is tried splinting without resolution.  MRI shows partial scapholunate ligament tear.  She wishes to undergo operative arthroscopy with debridement and possible shrinkage.  Risks, benefits and alternatives of surgery were discussed including the risks of blood loss, infection, damage to nerves, vessels, tendons, ligaments, bone for surgery, need for additional surgery, complications with wound healing, continued pain, stiffness, continued pain.  She voiced understanding of these risks and elected to proceed. ? ?OPERATIVE COURSE:  After being identified preoperatively by myself,  the patient and I agreed on the procedure and site of the procedure.  The surgical site was marked.  Surgical consent had been signed. Preoperative IV antibiotic prophylaxis was given. She was transferred to the operating room and placed on the operating table in supine position with the Right upper extremity on an arm board.  Sedation was induced by the anesthesiologist. A regional block had been performed by anesthesia in preoperative holding.    Right upper extremity was prepped and draped in normal sterile orthopedic fashion.  A surgical pause was performed between  the surgeons, anesthesia, and operating room staff and all were in agreement as to the patient, procedure, and site of procedure.  Tourniquet was not inflated.  The hand and forearm were secured in the arthroscopy tower.  The joint was insufflated to 3 4 portal with sterile saline.  A incision was made through the skin only.  The subcutaneous tissues were spread with a hemostat and the joint entered.  The trocar was placed followed by the cannula.  The camera was introduced through the 3-4 portal.  The joint was inspected.  There was frayed scapholunate ligament.  There was some fraying of the distal radius cartilage and apposition to this as well.  The ligament was partly torn from the ulnar side.  There was synovitis.  The TFCC was identified and was intact.  A 4-5 portal was made in the same fashion.  The probe was introduced.  The TFCC was probed and there was no apparent damage.  The camera was then removed and placed through the 4-5 portal.  The LT ligament was identified and appeared intact.  The shaver was introduced through the 3-4 portal and used to debride the joint of any frayed tissue.  The arthroscopy wand was then introduced through the 3-4 portal and used to thermally shrink the scapholunate ligament.  The cartilage damage on the distal radius and apposition to this was also debrided back to a stable edge.  The shaver was used to remove any remaining frayed cartilage.  A midcarpal portal was then made again in the same fashion.  The camera was introduced.  There was step-off of the SL interval.  Type II lunate.  The  capital hamate joint was lined up.  The arthroscopy equipment was removed.  The portals were closed with 4-0 nylon in a horizontal mattress fashion.  They were dressed with sterile Xeroform 4 x 4's and wrapped with a Kerlix bandage.  Volar and dorsal slab splint was placed and wrapped with Kerlix and Ace bandage.   Fingertips were pink with brisk capillary refill at completion of the  procedure.  The operative  drapes were broken down.  The patient was awoken from anesthesia safely.  She was transferred back to the stretcher and taken to PACU in stable condition.  I will see her back in the office in 1 week for postoperative followup.  I will give her a prescription for oxycodone 5 mg 1 p.o. every 6 hours.  Pain dispense #20. ? ? ?Meghan Cover, MD ?Electronically signed, 01/25/22 ?

## 2022-01-26 ENCOUNTER — Encounter (HOSPITAL_COMMUNITY): Payer: Self-pay | Admitting: Orthopedic Surgery

## 2022-01-26 NOTE — Anesthesia Postprocedure Evaluation (Signed)
Anesthesia Post Note ? ?Patient: YISEL MEGILL ? ?Procedure(s) Performed: ARTHROSCOPY RIGHT WRIST WITH  DEBRIDEMENT/ SHRINKAGE (Right: Wrist) ? ?  ? ?Patient location during evaluation: PACU ?Anesthesia Type: General and Regional ?Level of consciousness: awake and alert ?Pain management: pain level controlled ?Vital Signs Assessment: post-procedure vital signs reviewed and stable ?Respiratory status: spontaneous breathing, nonlabored ventilation, respiratory function stable and patient connected to nasal cannula oxygen ?Cardiovascular status: stable and blood pressure returned to baseline ?Postop Assessment: no apparent nausea or vomiting ?Anesthetic complications: no ? ? ?No notable events documented. ? ?Last Vitals:  ?Vitals:  ? 01/25/22 1255 01/25/22 1310  ?BP: 113/77 120/86  ?Pulse: (!) 59 (!) 56  ?Resp: 17 16  ?Temp: 36.6 ?C 36.6 ?C  ?SpO2: 96% 94%  ?  ?Last Pain:  ?Vitals:  ? 01/25/22 1310  ?TempSrc:   ?PainSc: 0-No pain  ? ? ?  ?  ?  ?  ?  ?  ? ?Lane S ? ? ? ? ?

## 2022-01-31 ENCOUNTER — Encounter (HOSPITAL_COMMUNITY): Payer: Self-pay

## 2022-01-31 ENCOUNTER — Ambulatory Visit (HOSPITAL_COMMUNITY): Payer: Medicare Other | Admitting: Licensed Clinical Social Worker

## 2022-02-05 ENCOUNTER — Ambulatory Visit (HOSPITAL_COMMUNITY): Payer: Medicare Other | Admitting: Licensed Clinical Social Worker

## 2022-02-06 ENCOUNTER — Ambulatory Visit (INDEPENDENT_AMBULATORY_CARE_PROVIDER_SITE_OTHER): Payer: Medicare Other | Admitting: Licensed Clinical Social Worker

## 2022-02-06 DIAGNOSIS — F4310 Post-traumatic stress disorder, unspecified: Secondary | ICD-10-CM

## 2022-02-06 DIAGNOSIS — F411 Generalized anxiety disorder: Secondary | ICD-10-CM | POA: Diagnosis not present

## 2022-02-06 DIAGNOSIS — F331 Major depressive disorder, recurrent, moderate: Secondary | ICD-10-CM

## 2022-02-06 NOTE — Progress Notes (Signed)
Virtual Visit via Video Note   I connected with Meghan [name] on 02/06/22 at 2:00pm by video enabled telemedicine application and verified that I am speaking with the correct person using two identifiers.   I discussed the limitations, risks, security and privacy concerns of performing an evaluation and management service by video and the availability of in person appointments. I also discussed with the patient that there may be a patient responsible charge related to this service. The patient expressed understanding and agreed to proceed.   I discussed the assessment and treatment plan with the patient. The patient was provided an opportunity to ask questions and all were answered. The patient agreed with the plan and demonstrated an understanding of the instructions.   The patient was advised to call back or seek an in-person evaluation if the symptoms worsen or if the condition fails to improve as anticipated.   I provided 1 hour of non-face-to-face time during this encounter.     [name], LCSW, LCAS _______________________ THERAPIST PROGRESS NOTE   Session Time:  2:00pm - 3:00pm  Location: Patient: Patient home    Provider: [facility]   Participation Level: Active    Behavioral Response: Alert, casually dressed, euthymic mood/affect   Type of Therapy:  Individual Therapy   Treatment Goals addressed: Mood management; Medication management; Maintaining healthy boundaries   Progress Towards Goals: Progressing   Interventions: CBT, assertive communication skills   Summary: Meghan [name] is a 56 year old divorced African American female that presented for therapy appointment today with diagnoses of Major Depressive Disorder, recurrent, moderate, Generalized Anxiety Disorder and PTSD.      Suicidal/Homicidal: None; without plan or intent                                                                                                                Therapist Response:   Clinician met with Meghan [name] for virtual therapy session and assessed for safety, sobriety, and medication compliance.  Meghan [name] presented for appointment on time and spoke in a manner that was alert, oriented x5, with no evidence or self-report of active SI/HI or A/V H.  Meghan [name] reported ongoing compliance with medication and denied any use of alcohol or illicit substances.  Clinician inquired about Meghan [name]'s emotional ratings today, as well as any significant changes in thoughts, feelings, or behavior since last check-in.  Meghan [name] reported scores of 0/10 for depression, 0/10 for anxiety, and 0/10 for irritability.  Meghan [name] also denied experiencing any recent outbursts or panic attacks.  Meghan [name] reported that she just recently returned from a trip to [place], and felt fatigue, but wished to continue with our session despite this.  Meghan [name] reported that yesterday was difficult for her, because a train derailed and postponed her from returning on time as scheduled, which influenced her anger/irritability.  Meghan [name] reported that she didn't have food, drink, or a comfortable place to sit for over 4 hours, and there were two separate incidents with people that tested her patience.  Clinician inquired about details  of these triggering incidents, whether she was able to implement any skills learned from therapy to address issues, and alternative strategies that could have been utilized instead.  Meghan [name] reported that in both cases, she practiced her breathing technique to stay calm, and assertive communication in order to express her feelings in calm, respectful manner to reach a resolution.  Meghan [name] reported that with one person the issue was immediately resolved, but with another he escalated the situation, and purposely went out of the way to upset Meghan [name], including use of foul language directed towards her.  Meghan [name] reported that she stayed calm and got staff involved to assist, which led them to move this person away from her as a result.  Clinician  praised Meghan [name] for her restraint, and appropriate demonstration of skills learned from therapy to manage conflict.  Clinician further tested Meghan [name]'s communication skills today by offering various roleplay scenarios from a handout where she was tasked with assertively addressing interpersonal problems that could arise in future interactions with her supports.  Intervention was effective, as evidenced by Meghan [name] actively participating in numerous vignettes, showing excellent demonstration of assertive communication skills, and confidence in her ability to set healthy boundaries for herself, stating "I'm learning and I've got you to thank for it".  Clinician will continue to monitor.                                      Plan: Follow up again in 1 week.    Diagnosis: Major depressive disorder, recurrent, moderate; Generalized Anxiety Disorder; and PTSD   Collaboration of Care:   No collaboration of care required for this visit.                                                   Patient/Guardian was advised Release of Information must be obtained prior to any record release in order to collaborate their care with an outside provider. Patient/Guardian was advised if they have not already done so to contact the registration department to sign all necessary forms in order for [name] to release information regarding their care.    Consent: Patient/Guardian gives verbal consent for treatment and assignment of benefits for services provided during this visit. Patient/Guardian expressed understanding and agreed to proceed.  [name], LCSW, LCAS 02/06/22

## 2022-02-13 ENCOUNTER — Ambulatory Visit (INDEPENDENT_AMBULATORY_CARE_PROVIDER_SITE_OTHER): Payer: Medicare Other | Admitting: Licensed Clinical Social Worker

## 2022-02-13 DIAGNOSIS — F411 Generalized anxiety disorder: Secondary | ICD-10-CM

## 2022-02-13 DIAGNOSIS — F331 Major depressive disorder, recurrent, moderate: Secondary | ICD-10-CM

## 2022-02-13 DIAGNOSIS — F4310 Post-traumatic stress disorder, unspecified: Secondary | ICD-10-CM | POA: Diagnosis not present

## 2022-02-13 NOTE — Progress Notes (Signed)
Virtual Visit via Video Note   I connected with Meghan [name] on 02/13/22 at 2:00pm by video enabled telemedicine application and verified that I am speaking with the correct person using two identifiers.   I discussed the limitations, risks, security and privacy concerns of performing an evaluation and management service by video and the availability of in person appointments. I also discussed with the patient that there may be a patient responsible charge related to this service. The patient expressed understanding and agreed to proceed.   I discussed the assessment and treatment plan with the patient. The patient was provided an opportunity to ask questions and all were answered. The patient agreed with the plan and demonstrated an understanding of the instructions.   The patient was advised to call back or seek an in-person evaluation if the symptoms worsen or if the condition fails to improve as anticipated.   I provided 48 minutes of non-face-to-face time during this encounter.     [name], LCSW, LCAS _______________________ THERAPIST PROGRESS NOTE   Session Time:  2:00pm - 2:48pm  Location: Patient: Patient Home Provider: Clinician Home Office    Participation Level: Active    Behavioral Response: Alert, casually dressed, euthymic mood/affect   Type of Therapy:  Individual Therapy   Treatment Goals addressed: Mood management; Medication management; Maintaining healthy boundaries; Following safety plan   Progress Towards Goals: Progressing   Interventions: CBT, crisis planning    Summary: Meghan [name] is a 56 year old divorced African American female that presented for therapy appointment today with diagnoses of Major Depressive Disorder, recurrent, moderate, Generalized Anxiety Disorder and PTSD.      Suicidal/Homicidal: None; without plan or intent                                                                                                                Therapist  Response:  Clinician met with Meghan [name] for virtual therapy appointment and assessed for safety, sobriety, and medication compliance.  Meghan [name] presented for session on time and spoke in a manner that was alert, oriented x5, with no evidence or self-report of active SI/HI or A/V H.  Meghan [name] reported ongoing compliance with medication and denied any use of alcohol or illicit substances.  Clinician inquired about Meghan [name]'s current emotional ratings, as well as any significant changes in thoughts, feelings, or behavior since previous check-in.  Meghan [name] reported scores of 0/10 for depression, 0/10 for anxiety, and 0/10 for anger/irritability.  Meghan [name] also denied experiencing any recent outbursts or panic attacks.  Meghan [name] reported that she has been in a good mood over the past few days, and plans to go shopping ahead of her trip back to [place] next week.  Meghan [name] reported that she is a bit nervous about being out of state without her therapist for support.  Clinician proposed creating a crisis plan for travel to assist Meghan [name] with this anxiety, and she was agreeable to this.  Clinician went through several components of crisis planning with Meghan [name], including signs of an impending  crisis building, triggers (internal and external) that could negatively influence her mood, personal coping skills that could be implemented to calm herself down, supportive family or friends she could outreach for assistance if needed, as well as professional sources, including nearby behavioral hospitals she could visit for medical intervention if necessary.  Intervention was effective, as evidenced by Meghan [name] actively engaging in crisis planning process, identifying warning signs to be mindful of including desire to isolate and feelings of apathy, as well as triggers to manage, such as being around people that ask too much from her, or attempt to push her personal boundaries.  Meghan [name] was also able to identify coping skills she can use to calm down if upset,  including deep breathing, and positive visualization, as well as 5 supportive friends she could call for assistance any time of day.  Meghan [name] reported that if her coping skills and supports cannot resolve the crisis, she will voluntarily go to the nearby behavioral hospital for assessment as she did in the past.  Meghan [name] reported that she believes this will help keep her safe on her trip, and stated "Things that I used to do, I don't do anymore.  I don't get upset as easily as I used to.  My temper has calmed down a lot and I'm proud of myself for that".  Clinician will continue to monitor.                                      Plan: Follow up again in 1 week.    Diagnosis: Major depressive disorder, recurrent, moderate; Generalized Anxiety Disorder; and PTSD   Collaboration of Care:   No collaboration of care required for this visit.                                                   Patient/Guardian was advised Release of Information must be obtained prior to any record release in order to collaborate their care with an outside provider. Patient/Guardian was advised if they have not already done so to contact the registration department to sign all necessary forms in order for [name] to release information regarding their care.    Consent: Patient/Guardian gives verbal consent for treatment and assignment of benefits for services provided during this visit. Patient/Guardian expressed understanding and agreed to proceed.  [name], LCSW, LCAS 02/13/22

## 2022-02-19 ENCOUNTER — Ambulatory Visit (INDEPENDENT_AMBULATORY_CARE_PROVIDER_SITE_OTHER): Payer: Medicare Other | Admitting: Licensed Clinical Social Worker

## 2022-02-19 DIAGNOSIS — F4310 Post-traumatic stress disorder, unspecified: Secondary | ICD-10-CM

## 2022-02-19 DIAGNOSIS — F331 Major depressive disorder, recurrent, moderate: Secondary | ICD-10-CM | POA: Diagnosis not present

## 2022-02-19 DIAGNOSIS — F411 Generalized anxiety disorder: Secondary | ICD-10-CM | POA: Diagnosis not present
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
## 2022-02-26 ENCOUNTER — Ambulatory Visit (HOSPITAL_COMMUNITY): Payer: Medicare Other | Admitting: Licensed Clinical Social Worker

## 2022-02-27 ENCOUNTER — Telehealth: Payer: Self-pay | Admitting: Urology

## 2022-02-27 NOTE — Telephone Encounter (Signed)
DOS - 03/26/22  [facility] FUSION RIGHT --- [id number]  [employer] EFFECTIVE DATE - 09/17/21  PLAN DEDUCTIBLE - $0.00 OUT OF POCKET - $8,300.00 W/ $5,131.90 REMAINING COINSURANCE - 20% COPAY - $0.00   PER [employer] WEBSITE FOR CPT CODE 82423 Notification or Prior Authorization is not required for the requested services  Decision ID #:[id number]

## 2022-02-28 ENCOUNTER — Ambulatory Visit (INDEPENDENT_AMBULATORY_CARE_PROVIDER_SITE_OTHER): Payer: Medicare Other | Admitting: Licensed Clinical Social Worker

## 2022-02-28 DIAGNOSIS — F411 Generalized anxiety disorder: Secondary | ICD-10-CM

## 2022-02-28 DIAGNOSIS — F331 Major depressive disorder, recurrent, moderate: Secondary | ICD-10-CM

## 2022-02-28 DIAGNOSIS — F4310 Post-traumatic stress disorder, unspecified: Secondary | ICD-10-CM

## 2022-02-28 NOTE — Progress Notes (Signed)
Virtual Visit via Video Note   I connected with Meghan [name] on 02/28/22 at 3:00pm by video enabled telemedicine application and verified that I am speaking with the correct person using two identifiers.   I discussed the limitations, risks, security and privacy concerns of performing an evaluation and management service by video and the availability of in person appointments. I also discussed with the patient that there may be a patient responsible charge related to this service. The patient expressed understanding and agreed to proceed.   I discussed the assessment and treatment plan with the patient. The patient was provided an opportunity to ask questions and all were answered. The patient agreed with the plan and demonstrated an understanding of the instructions.   The patient was advised to call back or seek an in-person evaluation if the symptoms worsen or if the condition fails to improve as anticipated.   I provided 30 minutes of non-face-to-face time during this encounter.     [name], LCSW, LCAS _______________________ THERAPIST PROGRESS NOTE   Session Time:  3:00pm - 3:30pm   Location: Patient: Patient Home Provider: Clinician Home Office   Participation Level: Active    Behavioral Response: Alert, casually dressed, euthymic mood/affect   Type of Therapy:  Individual Therapy   Treatment Goals addressed: Mood management; Medication management; Attending church regularly; Safety planning   Progress Towards Goals: Progressing   Interventions: CBT, crisis planning    Summary: Meghan [name] is a 56 year old divorced African American female that presented for therapy appointment today with diagnoses of Major Depressive Disorder, recurrent, moderate, Generalized Anxiety Disorder and PTSD.      Suicidal/Homicidal: None; without plan or intent                                                                                                                Therapist Response:   Clinician met with Meghan [name] for virtual therapy appointment and assessed for safety, sobriety, and medication compliance.  Meghan [name] presented for session on time and spoke in a manner that was alert, oriented x5, with no evidence or self-report of active SI/HI or A/V H.  Meghan [name] reported ongoing compliance with medication and denied any use of alcohol or illicit substances.  Clinician inquired about Meghan [name]'s current emotional ratings, as well as any significant changes in thoughts, feelings, or behavior since previous check-in.  Meghan [name] reported scores of 0/10 for depression, 0/10 for anxiety, and 0/10 for anger/irritability.  Meghan [name] also denied experiencing any recent outbursts, or panic attacks.  Meghan [name] reported that a recent success was learning that issues within her church were settled amicably, so she is no longer anxious about returning, although she still intends to explore alternatives discussed from last session.  Meghan [name] reported that a struggle has been anticipating upcoming trip to [place], [place] to visit family for her grandson's graduation.  Clinician assisted Meghan [name] in creating an appropriate crisis plan to reduce anticipatory anxiety related to this trip.  Clinician tasked Meghan [name] with identifying triggers (internal and external) which could  pose risk to her mental health while on the trip, coping skills that could be utilized in order to manage stressors, and self-care activities to explore while out of town so she would have positive things to look forward to and less to fear.  Clinician also inquired about supports Meghan [name] could outreach if coping skills provide ineffective.  Intervention was effective, as evidenced by Meghan [name] actively engaging in crisis planning process and reporting that some triggers to be mindful of include having her daughter ask too much from her or challenge boundaries, or noticing strong feelings of depression or anxiety.  Meghan [name] reported that she would cope by resting when needed, practicing  her deep breathing and visualization techniques, and outreach her sister or best friend if necessary.  Meghan [name] reported that she also has several self-care activities to look forward to, such as having a spa day with a friend, and visiting the [place] will continue to monitor.                                     Plan: Follow up again in 1 week.    Diagnosis: Major depressive disorder, recurrent, moderate; Generalized Anxiety Disorder; and PTSD   Collaboration of Care:   No collaboration of care required for this visit.                                                   Patient/Guardian was advised Release of Information must be obtained prior to any record release in order to collaborate their care with an outside provider. Patient/Guardian was advised if they have not already done so to contact the registration department to sign all necessary forms in order for [name] to release information regarding their care.    Consent: Patient/Guardian gives verbal consent for treatment and assignment of benefits for services provided during this visit. Patient/Guardian expressed understanding and agreed to proceed.  [name], [place], LCAS 02/28/22

## 2022-03-07 ENCOUNTER — Telehealth (HOSPITAL_COMMUNITY): Payer: Self-pay | Admitting: Licensed Clinical Social Worker

## 2022-03-07 ENCOUNTER — Ambulatory Visit (HOSPITAL_COMMUNITY): Payer: Medicare Other | Admitting: Licensed Clinical Social Worker

## 2022-03-07 NOTE — Telephone Encounter (Signed)
Meghan [name] had a virtual therapy appointment scheduled today at 3pm.  Clinician sent email and text reminders for appointment at 3pm, but Meghan [name] did not join.  Clinician also attempted to reach Meghan [name] by phone at 3:10pm, but received a busy signal, and could not leave a voicemail.  Clinician ended virtual session after 3:15pm when Meghan [name] did not appear, and informed front desk staff of no-show event.  [name], LCSW, LCAS 03/07/22

## 2022-03-12 ENCOUNTER — Ambulatory Visit (INDEPENDENT_AMBULATORY_CARE_PROVIDER_SITE_OTHER): Payer: Medicare Other | Admitting: Licensed Clinical Social Worker

## 2022-03-12 DIAGNOSIS — F4310 Post-traumatic stress disorder, unspecified: Secondary | ICD-10-CM

## 2022-03-12 DIAGNOSIS — F411 Generalized anxiety disorder: Secondary | ICD-10-CM

## 2022-03-12 DIAGNOSIS — F331 Major depressive disorder, recurrent, moderate: Secondary | ICD-10-CM | POA: Diagnosis not present

## 2022-03-26 ENCOUNTER — Ambulatory Visit (INDEPENDENT_AMBULATORY_CARE_PROVIDER_SITE_OTHER): Payer: Medicare Other | Admitting: Licensed Clinical Social Worker

## 2022-03-26 DIAGNOSIS — F331 Major depressive disorder, recurrent, moderate: Secondary | ICD-10-CM

## 2022-03-26 DIAGNOSIS — F4310 Post-traumatic stress disorder, unspecified: Secondary | ICD-10-CM | POA: Diagnosis not present

## 2022-03-26 DIAGNOSIS — F411 Generalized anxiety disorder: Secondary | ICD-10-CM | POA: Diagnosis not present
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
## 2022-04-02 ENCOUNTER — Ambulatory Visit (INDEPENDENT_AMBULATORY_CARE_PROVIDER_SITE_OTHER): Payer: Medicare Other | Admitting: Licensed Clinical Social Worker

## 2022-04-02 DIAGNOSIS — F331 Major depressive disorder, recurrent, moderate: Secondary | ICD-10-CM

## 2022-04-02 DIAGNOSIS — F4310 Post-traumatic stress disorder, unspecified: Secondary | ICD-10-CM

## 2022-04-02 DIAGNOSIS — F411 Generalized anxiety disorder: Secondary | ICD-10-CM | POA: Diagnosis not present
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
## 2022-04-04 ENCOUNTER — Encounter: Payer: Medicare Other | Admitting: Podiatry

## 2022-04-09 ENCOUNTER — Ambulatory Visit (INDEPENDENT_AMBULATORY_CARE_PROVIDER_SITE_OTHER): Payer: Medicare Other | Admitting: Licensed Clinical Social Worker

## 2022-04-09 DIAGNOSIS — F411 Generalized anxiety disorder: Secondary | ICD-10-CM

## 2022-04-09 DIAGNOSIS — F331 Major depressive disorder, recurrent, moderate: Secondary | ICD-10-CM

## 2022-04-09 DIAGNOSIS — F4310 Post-traumatic stress disorder, unspecified: Secondary | ICD-10-CM

## 2022-04-09 NOTE — Progress Notes (Signed)
Virtual Visit via Video Note   I connected with Trevia S. Rogel on 04/09/22 at 2:00pm by video enabled telemedicine application and verified that I am speaking with the correct person using two identifiers.   I discussed the limitations, risks, security and privacy concerns of performing an evaluation and management service by video and the availability of in person appointments. I also discussed with the patient that there may be a patient responsible charge related to this service. The patient expressed understanding and agreed to proceed.   I discussed the assessment and treatment plan with the patient. The patient was provided an opportunity to ask questions and all were answered. The patient agreed with the plan and demonstrated an understanding of the instructions.   The patient was advised to call back or seek an in-person evaluation if the symptoms worsen or if the condition fails to improve as anticipated.   I provided 1 hour of non-face-to-face time during this encounter.     [name], LCSW, LCAS _______________________ THERAPIST PROGRESS NOTE   Session Time:  2:00pm - 3:00pm    Location: Patient: Patient Home Provider: [facility]   Participation Level: Active    Behavioral Response: Alert, casually dressed, anxious mood/affect   Type of Therapy:  Individual Therapy   Treatment Goals addressed: Mood management; Medication management  Progress Towards Goals: Progressing   Interventions: CBT: challenging anxious thoughts    Summary: Lilibeth S. Hirota is a 56 year old divorced African American female that presented for therapy appointment today with diagnoses of Major Depressive Disorder, recurrent, moderate, Generalized Anxiety Disorder and PTSD.      Suicidal/Homicidal: None; without plan or intent                                                                                                                Therapist Response:  Clinician met with Cythnia for virtual  therapy appointment and assessed for safety, sobriety, and medication compliance.  Krystalyn presented for session on time and was alert, oriented x5, with no evidence or self-report of active SI/HI or A/V H.  Conor reported ongoing compliance with medication and denied any use of alcohol or illicit substances.  Clinician inquired about Joyanne's current emotional ratings, as well as any significant changes in thoughts, feelings, or behavior since previous check-in.  Alahni reported scores of 0/10 for depression, 0/10 for anxiety, and 0/10 for anger/irritability.  Danetra also denied experiencing any recent panic attacks or outbursts.  Laurana reported that a recent success has been gradually processing recent losses, stating "I think about them all the time, but now I'm thinking more about the good times we shared".  Shelsie reported that one struggle has been worrying about how she will handle the funerals in comparison, stating "The funerals are coming up and I'm a little concerned because I don't do well at those".  Clinician utilized handout in session today titled "Worry exploration" in order to assist Ellionna in reducing her anxiety related to upcoming funerals.  This worksheet featured a  series of Socratic questions aimed at exploring the most likely outcomes for a situation of concern, rather than focusing on the worst possible outcome (i.e. catastrophizing).  Clinician assisted Vondell in identifying and challenging any irrational beliefs related to this worry, in addition to utilizing problem solving approach to explore strategies which would help her accomplish goal successfully attending both funerals.  Crystal actively participated in discussion on handout, reporting that she is primarily worried that she will lose control and become very upset if other supports break down and cry.  Morna reported that she also worries about some people judging her for emotional expression.  Latash reported that there is sufficient  evidence to suggest she will cry and be emotionally impacted by the event, but acknowledged that she has built up more coping skills over recent years, and will be surrounded by many positive supports who encourage and support her new vulnerability.  Intervention was effective, as evidenced by Hyacinth reporting that discussion on this subject reduced her anxiety and increased her confidence in her ability to attend both funerals.  Tyeshia stated "I'm still not looking forward to it, and I know I'm going to cry, but I know nothing bad is going to happen now".  Clinician will continue to monitor.     Plan: Follow up again in 1 week.    Diagnosis: Major depressive disorder, recurrent, moderate; Generalized Anxiety Disorder; and PTSD   Collaboration of Care:   No collaboration of care required for this visit.                                                   Patient/Guardian was advised Release of Information must be obtained prior to any record release in order to collaborate their care with an outside provider. Patient/Guardian was advised if they have not already done so to contact the registration department to sign all necessary forms in order for [name] to release information regarding their care.    Consent: Patient/Guardian gives verbal consent for treatment and assignment of benefits for services provided during this visit. Patient/Guardian expressed understanding and agreed to proceed.  [name], LCSW, LCAS 04/09/22

## 2022-04-16 ENCOUNTER — Ambulatory Visit (INDEPENDENT_AMBULATORY_CARE_PROVIDER_SITE_OTHER): Payer: Medicare Other | Admitting: Licensed Clinical Social Worker

## 2022-04-16 DIAGNOSIS — F331 Major depressive disorder, recurrent, moderate: Secondary | ICD-10-CM | POA: Diagnosis not present

## 2022-04-16 DIAGNOSIS — F411 Generalized anxiety disorder: Secondary | ICD-10-CM | POA: Diagnosis not present

## 2022-04-16 DIAGNOSIS — F4310 Post-traumatic stress disorder, unspecified: Secondary | ICD-10-CM | POA: Diagnosis not present

## 2022-04-16 NOTE — Progress Notes (Signed)
Virtual Visit via Video Note   I connected with Meghan [name] on 04/16/22 at 2:00pm by video enabled telemedicine application and verified that I am speaking with the correct person using two identifiers.   I discussed the limitations, risks, security and privacy concerns of performing an evaluation and management service by video and the availability of in person appointments. I also discussed with the patient that there may be a patient responsible charge related to this service. The patient expressed understanding and agreed to proceed.   I discussed the assessment and treatment plan with the patient. The patient was provided an opportunity to ask questions and all were answered. The patient agreed with the plan and demonstrated an understanding of the instructions.   The patient was advised to call back or seek an in-person evaluation if the symptoms worsen or if the condition fails to improve as anticipated.   I provided 1 hour of non-face-to-face time during this encounter.     [name], LCSW, LCAS _______________________ THERAPIST PROGRESS NOTE   Session Time:  2:00pm - 3:00pm    Location: Patient: Patient Home Provider: [facility]   Participation Level: Active    Behavioral Response: Alert, casually dressed, anxious mood/affect   Type of Therapy:  Individual Therapy   Treatment Goals addressed: Mood management; Medication management  Progress Towards Goals: Progressing   Interventions: CBT, grief referral, problem solving, safety planning      Summary: Meghan [name] is a 56 year old divorced African American female that presented for therapy appointment today with diagnoses of Major Depressive Disorder, recurrent, moderate, Generalized Anxiety Disorder and PTSD.      Suicidal/Homicidal: None; without plan or intent                                                                                                                Therapist Response:  Clinician met  with Meghan [name] for virtual therapy session and assessed for safety, sobriety, and medication compliance.  Meghan [name] presented for appointment on time and was alert, oriented x5, with no evidence or self-report of active SI/HI or A/V H.  Meghan [name] reported ongoing compliance with medication and denied any use of alcohol or illicit substances.  Clinician inquired about Meghan [name]'s emotional ratings today, as well as any significant changes in thoughts, feelings, or behavior since last check-in.  Meghan [name] reported scores of 0/10 for depression, 2/10 for anxiety, and 0/10 for anger/irritability.  Meghan [name] reported that a recent struggle was experiencing a panic attack and outburst on Saturday when she attended her friend's funeral.  Meghan [name] reported that she was supported by family and friends to help cope, but still experienced a seizure when stress was too high.  Clinician expressed sympathy for Velecia's loss and revisited referral for grief counseling, given the number of unresolved losses that she has faced over time, and severity of reaction to this funeral. Meghan [name] stated "I haven't really thought much about it, my head is still spinning, but Ill think on it later this week after the  next funeral on Thursday.  I think that one will hit me harder".  Clinician assisted Meghan [name] in running cost benefit analysis regarding whether it will be safe to attend this second funeral.  Meghan [name] participated in analysis, reporting that the benefits outweigh the costs and she would feel bad if she didn't show up to provide support to the family.  Clinician assisted Meghan [name] in developing a safety plan to help cope with attending this funeral based upon her decision.  This included inventorying supports she can outreach for assistance if feeling overwhelmed, coping skills that could be utilized to manage stress, and safe spaces to move to if triggers are present.  Intervention was effective, as evidenced by Meghan [name] engaging in safety planning process, reporting that  she has at least 3 supports she can call on the phone, and she will plan to avoid going in the sanctuary since this will be a much more triggering location.  She reported that she will also use coping skills from therapy, such as deep breathing, positive visualization, and positive self-talk, stating "Ill tell myself that I know its not easy, but I can make it through. I think it will help keep my mind focused".  She reported that she will also update her PCP and neurologist about recent seizure today.  Clinician will continue to monitor.        Plan: Follow up again in 1 week.    Diagnosis: Major depressive disorder, recurrent, moderate; Generalized Anxiety Disorder; and PTSD   Collaboration of Care:   No collaboration of care required for this visit.                                                   Patient/Guardian was advised Release of Information must be obtained prior to any record release in order to collaborate their care with an outside provider. Patient/Guardian was advised if they have not already done so to contact the registration department to sign all necessary forms in order for [name] to release information regarding their care.    Consent: Patient/Guardian gives verbal consent for treatment and assignment of benefits for services provided during this visit. Patient/Guardian expressed understanding and agreed to proceed.  [name], [place], LCAS 04/16/22

## 2022-04-18 ENCOUNTER — Encounter: Payer: Medicare Other | Admitting: Podiatry

## 2022-04-23 ENCOUNTER — Ambulatory Visit (INDEPENDENT_AMBULATORY_CARE_PROVIDER_SITE_OTHER): Payer: Medicare Other | Admitting: Licensed Clinical Social Worker

## 2022-04-23 DIAGNOSIS — F411 Generalized anxiety disorder: Secondary | ICD-10-CM | POA: Diagnosis not present

## 2022-04-23 DIAGNOSIS — F331 Major depressive disorder, recurrent, moderate: Secondary | ICD-10-CM | POA: Diagnosis not present

## 2022-04-23 DIAGNOSIS — F4310 Post-traumatic stress disorder, unspecified: Secondary | ICD-10-CM | POA: Diagnosis not present

## 2022-04-23 NOTE — Progress Notes (Signed)
Virtual Visit via Video Note   I connected with [name] S. Trachtenberg on 04/23/22 at 2:00pm by video enabled telemedicine application and verified that I am speaking with the correct person using two identifiers.   I discussed the limitations, risks, security and privacy concerns of performing an evaluation and management service by video and the availability of in person appointments. I also discussed with the patient that there may be a patient responsible charge related to this service. The patient expressed understanding and agreed to proceed.   I discussed the assessment and treatment plan with the patient. The patient was provided an opportunity to ask questions and all were answered. The patient agreed with the plan and demonstrated an understanding of the instructions.   The patient was advised to call back or seek an in-person evaluation if the symptoms worsen or if the condition fails to improve as anticipated.   I provided 50 minutes of non-face-to-face time during this encounter.     Noralee Stain, LCSW, LCAS _______________________ THERAPIST PROGRESS NOTE   Session Time:  2:00pm - 2:50pm     Location: Patient: Patient Home Provider: [facility]   Participation Level: Active    Behavioral Response: Alert, casually dressed, anxious mood/affect   Type of Therapy:  Individual Therapy   Treatment Goals addressed: Mood management; Medication management  Progress Towards Goals: Progressing   Interventions: CBT, psychoeducation on budgeting, problem solving     Summary: Langston S. Rumer is a 56 year old divorced African American female that presented for therapy appointment today with diagnoses of Major Depressive Disorder, recurrent, moderate, Generalized Anxiety Disorder and PTSD.      Suicidal/Homicidal: None; without plan or intent                                                                                                                Therapist Response:  Clinician met  with Lorelle for virtual therapy appointment and assessed for safety, sobriety, and medication compliance.  Zaleigh presented for session on time and was alert, oriented x5, with no evidence or self-report of active SI/HI or A/V H.  Huxley reported ongoing compliance with medication and denied any use of alcohol or illicit substances.  Clinician inquired about Gabbie's current emotional ratings, as well as any significant changes in thoughts, feelings, or behavior since previous check-in.  Ferrell reported scores of 0/10 for depression, 0/10 for anxiety, and 0/10 for anger/irritability.  Larry denied any recent panic attacks or outbursts.  She reported that a recent success was getting home from out of town and having the chance to focus on her wellbeing for the next few day.  She reported that one struggle has been dealing with financial anxiety, stating "Right now I have nothing.  I was spending money left and right when I went to [place].  I'm tired of living from check to check.  I cant work, but I'm trying to figure out how to make extra money.  I cant legally work since [employer] on total disability".  Clinician provided Jaedin with a handout today from the [facility] on how to establish a well balanced budget.  This handout's purpose was to help Kimmerly increase insight into exactly how much money she spends each month based upon various categories including housing, food, transportation, health, personal/family, finances, and misc.  A calculator was also featured in order to determine the difference between her total monthly income versus expenses, and Meegan was encouraged to identify which specific areas (i.e. fast food, bills, gas, etc) she could cut costs down in to reduce debt, and curb related anxiety as a result.  Intervention was effective, as evidenced by [employer] participating in discussion on this subject, and reporting that this was helpful for increasing awareness into areas where she has been overspending, including on  clothing and food.  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  Clinician will continue to monitor.        Plan: Follow up again in 1 week.    Diagnosis: Major depressive disorder, recurrent, moderate; Generalized Anxiety Disorder; and PTSD   Collaboration of Care:   No collaboration of care required for this visit.                                                   Patient/Guardian was advised Release of Information must be obtained prior to any record release in order to collaborate their care with an outside provider. Patient/Guardian was advised if they have not already done so to contact the registration department to sign all necessary forms in order for [name] to release information regarding their care.    Consent: Patient/Guardian gives verbal consent for treatment and assignment of benefits for services provided during this visit. Patient/Guardian expressed understanding and agreed to proceed.  [name], LCSW, LCAS 04/23/22

## 2022-04-25 ENCOUNTER — Ambulatory Visit (INDEPENDENT_AMBULATORY_CARE_PROVIDER_SITE_OTHER): Payer: Medicare Other | Admitting: Podiatry

## 2022-04-25 DIAGNOSIS — M19071 Primary osteoarthritis, right ankle and foot: Secondary | ICD-10-CM | POA: Insufficient documentation

## 2022-04-25 DIAGNOSIS — M722 Plantar fascial fibromatosis: Secondary | ICD-10-CM | POA: Diagnosis not present

## 2022-04-25 DIAGNOSIS — M7752 Other enthesopathy of left foot: Secondary | ICD-10-CM

## 2022-04-25 DIAGNOSIS — M62461 Contracture of muscle, right lower leg: Secondary | ICD-10-CM

## 2022-04-25 DIAGNOSIS — M21861 Other specified acquired deformities of right lower leg: Secondary | ICD-10-CM | POA: Diagnosis not present

## 2022-04-25 MED ORDER — METHYLPREDNISOLONE 4 MG PO TBPK: ORAL_TABLET | ORAL | 0 refills | Status: DC

## 2022-04-25 NOTE — Progress Notes (Signed)
Subjective:  Patient ID: Meghan [name], female    DOB: [birth year],  MRN: [id number]  Chief Complaint  Patient presents with   Foot Pain    56 y.o. female presents with the above complaint.  Patient presents with complaint of right heel pain and left ankle pain.  Patient has been on for quite some time is progressive gotten worse.  Patient states painful to touch.  Hurts with ambulation.  Pain scale 7 out of 10.  Hurts knee on both the sides hurts with pressure and walking.  Has not seen anyone else.  She is a diabetic.  She states he is not on medication.   Review of Systems: Negative except as noted in the HPI. Denies N/V/F/Ch.  Past Medical History:  Diagnosis Date   Anxiety    Arthritis    left knee   Asthma    Atrial fibrillation ([place]) 02/16/2019   CHF (congestive heart failure) ([place]) 01/2019   Physician from [place] informed her she had this    Diabetes mellitus without complication ([place])    Dysrhythmia    Atrial fibrillation   GERD (gastroesophageal reflux disease)    Hiatal hernia    History of pulmonary embolus ([place])    Hypercholesterolemia    Macular degeneration    of left eye   MDD (major depressive disorder)    Osteoarthritis    knees   Pneumonia    in the past   PTSD (post-traumatic stress disorder)    Pulmonary embolism ([place])    bilateral, none since 2003   Seizure disorder ([place])    Per patient last seizure was in 2020.   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)   Vitamin D deficiency     Current Outpatient Medications:    methylPREDNISolone (MEDROL DOSEPAK) 4 MG TBPK tablet, Take as directed, Disp: 21 each, Rfl: 0   acetaminophen (TYLENOL) 500 MG tablet, Take 1,000 mg by mouth every 6 (six) hours as needed for moderate pain., Disp: , Rfl:    albuterol (VENTOLIN HFA) 108 (90 Base) MCG/ACT inhaler, Inhale 2 puffs into the lungs every 6 (six) hours as needed for wheezing or shortness of breath., Disp: , Rfl:     atorvastatin (LIPITOR) 40 MG tablet, Take 40 mg by mouth every evening., Disp: , Rfl:    BREZTRI AEROSPHERE 160-9-4.8 MCG/ACT AERO, Inhale 2 puffs into the lungs in the morning and at bedtime. , Disp: , Rfl:    bumetanide (BUMEX) 1 MG tablet, Take 1 mg by mouth daily., Disp: , Rfl:    Cholecalciferol (VITAMIN D-3) 25 MCG (1000 UT) CAPS, Take 1,000 Units by mouth daily., Disp: , Rfl:    flecainide (TAMBOCOR) 50 MG tablet, Take 50 mg by mouth 2 (two) times daily. , Disp: , Rfl:    ketoconazole (NIZORAL) 2 % cream, Apply to bottoms of feet daily for 4 weeks. (Patient taking differently: 1 application. in the morning, at noon, and at bedtime.), Disp: 60 g, Rfl: 4   levETIRAcetam (KEPPRA) 500 MG tablet, Take 1 tablet (500 mg total) by mouth 2 (two) times daily., Disp: 180 tablet, Rfl: 3   metFORMIN (GLUCOPHAGE) 500 MG tablet, Take 500 mg by mouth daily with breakfast., Disp: , Rfl:    metoCLOPramide (REGLAN) 10 MG tablet, Take 1 tablet (10 mg total) by mouth every 6 (six) hours as needed for nausea (nausea/headache)., Disp: 10 tablet, Rfl: 0  metoprolol succinate (TOPROL-XL) 25 MG 24 hr tablet, Take 25 mg by mouth daily., Disp: , Rfl:    Omega-3 Fatty Acids (FISH OIL) 1000 MG CAPS, Take 1,000 mg by mouth daily with breakfast. , Disp: , Rfl:    omeprazole (PRILOSEC) 40 MG capsule, Take 40 mg by mouth daily., Disp: , Rfl:    ondansetron (ZOFRAN-ODT) 4 MG disintegrating tablet, Take 1 tablet (4 mg total) by mouth every 8 (eight) hours as needed for nausea or vomiting., Disp: 20 tablet, Rfl: 0   oxyCODONE (ROXICODONE) 5 MG immediate release tablet, 1 tab PO q6 hours prn pain, Disp: 20 tablet, Rfl: 0   rivaroxaban (XARELTO) 20 MG TABS tablet, Take 20 mg by mouth at bedtime. , Disp: , Rfl:    RYBELSUS 14 MG TABS, Take 14 mg by mouth daily., Disp: , Rfl:    sertraline (ZOLOFT) 50 MG tablet, Take 50 mg by mouth daily., Disp: , Rfl:   Social History   Tobacco Use  Smoking Status Never  Smokeless Tobacco  Never    Allergies  Allergen Reactions   Acetaminophen-Codeine Swelling, Rash and Other (See Comments)    Tylenol with Codeine, Tylenol #3,  (facial swelling, hives)   Meloxicam Anaphylaxis and Rash   Tramadol Hives and Other (See Comments)    Patient stated "I was trippin' and I do not want that ever again"   Codeine Swelling    hives   Coconut (Cocos Nucifera) Rash and Other (See Comments)    ANY coconut products    Tomato Rash    Fresh    Objective:  There were no vitals filed for this visit. There is no height or weight on file to calculate BMI. Constitutional Well developed. Well nourished.  Vascular Dorsalis pedis pulses palpable bilaterally. Posterior tibial pulses palpable bilaterally. Capillary refill normal to all digits.  No cyanosis or clubbing noted. Pedal hair growth normal.  Neurologic Normal speech. Oriented to person, place, and time. Epicritic sensation to light touch grossly present bilaterally.  Dermatologic Nails well groomed and normal in appearance. No open wounds. No skin lesions.  Orthopedic: Normal joint ROM without pain or crepitus bilaterally. No visible deformities. Tender to palpation at the calcaneal tuber right. No pain with calcaneal squeeze right. Ankle ROM diminished range of motion right. Silfverskiold Test: positive right.   Radiographs:  Assessment:   1. Plantar fasciitis of right foot   2. Capsulitis of left ankle   3. Gastrocnemius equinus of right lower extremity    Plan:  Patient was evaluated and treated and all questions answered.  Left ankle capsulitis -I explained to patient the etiology of capsulitis and management options were discussed.  Given the amount of pain that she is having she will benefit from steroid injection to the left ankle.  Patient agrees with plan we will proceed with steroid injection. -A steroid injection was performed at left ankle joint using 1% plain Lidocaine and 10 mg of Kenalog. This was  well tolerated.   Plantar Fasciitis, right with underlying ankle equinus - XR reviewed as above.  - Educated on icing and stretching. Instructions given.  - Injection delivered to the plantar fascia as below. - DME: Plantar fascial brace dispensed to support the medial longitudinal arch of the foot and offload pressure from the heel and prevent arch collapse during weightbearing - Pharmacologic management: None  Procedure: Injection Tendon/Ligament Location: Right plantar fascia at the glabrous junction; medial approach. Skin Prep: alcohol Injectate: 0.5 cc 0.5% marcaine plain, 0.5  cc of 1% Lidocaine, 0.5 cc kenalog 10. Disposition: Patient tolerated procedure well. Injection site dressed with a band-aid.  No follow-ups on file.Left ankle capsulitis and right Planter fasciitis injection and brace.

## 2022-04-27 ENCOUNTER — Telehealth: Payer: Self-pay | Admitting: Urology

## 2022-04-27 NOTE — Telephone Encounter (Signed)
DOS - 05/28/22  HALLUX MPJ FUSION RIGHT --- [id number]  [employer] EFFECTIVE DATE - 09/17/21  PLAN DEDUCTIBLE - $0.00 OUT OF POCKET - $8,300.00 W/ $8,138.87 REMAINING  COINSURANCE - 20% COPAY - $0.00   PER [employer] WEBSITE FOR CPT CODE 19597 Notification or Prior Authorization is not required for the requested services  Decision ID #:[id number]

## 2022-05-01 ENCOUNTER — Ambulatory Visit (INDEPENDENT_AMBULATORY_CARE_PROVIDER_SITE_OTHER): Payer: Medicare Other | Admitting: Licensed Clinical Social Worker

## 2022-05-01 DIAGNOSIS — F4310 Post-traumatic stress disorder, unspecified: Secondary | ICD-10-CM

## 2022-05-01 DIAGNOSIS — F411 Generalized anxiety disorder: Secondary | ICD-10-CM

## 2022-05-01 DIAGNOSIS — F331 Major depressive disorder, recurrent, moderate: Secondary | ICD-10-CM

## 2022-05-01 NOTE — Progress Notes (Signed)
Virtual Visit via Video Note   I connected with Meghan [name] on 05/01/22 at 2:00pm by video enabled telemedicine application and verified that I am speaking with the correct person using two identifiers.   I discussed the limitations, risks, security and privacy concerns of performing an evaluation and management service by video and the availability of in person appointments. I also discussed with the patient that there may be a patient responsible charge related to this service. The patient expressed understanding and agreed to proceed.   I discussed the assessment and treatment plan with the patient. The patient was provided an opportunity to ask questions and all were answered. The patient agreed with the plan and demonstrated an understanding of the instructions.   The patient was advised to call back or seek an in-person evaluation if the symptoms worsen or if the condition fails to improve as anticipated.   I provided 30 minutes of non-face-to-face time during this encounter.     [name], LCSW, LCAS _______________________ THERAPIST PROGRESS NOTE   Session Time:  2:00pm - 2:30pm   Location: Patient: Patient Home Provider: Clinical Home Office    Participation Level: Active    Behavioral Response: Alert, casually dressed, euthymic mood/affect    Type of Therapy:  Individual Therapy   Treatment Goals addressed: Mood management; Medication management; Completing autobiography    Progress Towards Goals: Progressing   Interventions: CBT, problem solving     Summary: Meghan [name] is a 56 year old divorced African American female that presented for therapy appointment today with diagnoses of Major Depressive Disorder, recurrent, moderate, Generalized Anxiety Disorder and PTSD.      Suicidal/Homicidal: None; without plan or intent                                                                                                                Therapist Response:  Meghan met  with Meghan [name] for virtual therapy session and assessed for safety, sobriety, and medication compliance.  Meghan [name] presented for appointment on time and was alert, oriented x5, with no evidence or self-report of active SI/HI or A/V H.  Meghan [name] [name] ongoing compliance with medication and denied any use of alcohol or illicit substances.  Meghan inquired about Meghan [name]'s emotional ratings today, as well as any significant changes in thoughts, feelings, or behavior since last check-in.  Meghan [name] [name] scores of 0/10 for depression, 0/10 for anxiety, and 0/10 for anger/irritability.  Meghan [name] denied any recent panic attacks or outbursts.  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  Meghan [name] [name] that Meghan had considered shelving this project, but too many people have encouraged her to complete it, so Meghan is trying to finish it now, with some obstacles that have arisen regarding motivation, outside pressure, and deadlines.  Meghan assisted Meghan [name] with this issue using a handout on SMART goals.  Meghan explained to Meghan [name] how using this acronym can help  break large goals down into smaller, realistic steps which are more achievable, and went through each section (Specific, Measurable, Attainable, Relevant, and Time Bound) using open ended questions to guide discussion (i.e. What am I going to do?; How will I measure success?; What do I need to do to get started today?; etc).   Intervention was effective, as evidenced by Meghan [name] participating in handout and reporting that this was helpful for changing her perspective on goal setting, so her plan is to begin working with a new publisher that is more flexible and empathetic about her needs.  Meghan [name] [name] that Meghan will try to complete her book by December of this year, and accomplish this by working on her book for 4 hours per week.  Meghan [name] [name]  that Meghan will also plan to invest in a desk for her apartment, since this could make it easier to commit her time to this project.  Meghan [name] stated "I know I can do it.  I think I'm about halfway there already".  Meghan [name].        Plan: Follow up again in 1 week.    Diagnosis: Major depressive disorder, recurrent, moderate; Generalized Anxiety Disorder; and PTSD   Collaboration of Care:   No collaboration of care required for this visit.                                                   Patient/Guardian was advised Release of Information must be obtained prior to any record release in order to collaborate their care with an outside provider. Patient/Guardian was advised if they have not already done so to contact the registration department to sign all necessary forms in order for [name] to release information regarding their care.    Consent: Patient/Guardian gives verbal consent for treatment and assignment of benefits for services provided during this visit. Patient/Guardian expressed understanding and agreed to proceed.  [name], [place], LCAS 05/01/22

## 2022-05-07 ENCOUNTER — Ambulatory Visit (HOSPITAL_COMMUNITY): Payer: Medicare Other | Admitting: Licensed Clinical Social Worker

## 2022-05-10 ENCOUNTER — Encounter (HOSPITAL_BASED_OUTPATIENT_CLINIC_OR_DEPARTMENT_OTHER): Payer: Self-pay | Admitting: Podiatry

## 2022-05-10 NOTE — Progress Notes (Addendum)
Spoke w/ via phone for pre-op interview--- [employer]----  ISTAT. Surgeon orders pending.          Lab results------Current EKG in Epic dated 12/2021 COVID test -----patient states asymptomatic no test needed Arrive at -------0730 NPO after MN NO Solid Food.  Clear liquids from MN until--- Med rec completed Medications to take morning of surgery ----- Albuterol-bring, Flecainide, Keppra, Breztri, MEtoprolol, Prilosec and Zoloft. Diabetic medication ----- No diabetic medications AM of surgery. Patient instructed no nail polish to be worn day of surgery Patient instructed to bring photo id and insurance card day of surgery Patient aware to have Driver (ride ) / caregiver  Meghan [name]  for 24 hours after surgery  Patient Special Instructions ----- Bring CPAP day of surgery. Pre-Op special Istructions ----- Patient verbalized understanding of instructions that were given at this phone interview. Patient denies shortness of breath, chest pain, fever, cough at this phone interview.   *Waiting on H&P to be faxed.  *Per patient given instructions by Dr Edson Snowball at [facility] on holding Eliquis 5-7 days prior to procedure

## 2022-05-14 ENCOUNTER — Ambulatory Visit (INDEPENDENT_AMBULATORY_CARE_PROVIDER_SITE_OTHER): Payer: Medicare Other | Admitting: Licensed Clinical Social Worker

## 2022-05-14 DIAGNOSIS — F411 Generalized anxiety disorder: Secondary | ICD-10-CM | POA: Diagnosis not present

## 2022-05-14 DIAGNOSIS — F331 Major depressive disorder, recurrent, moderate: Secondary | ICD-10-CM

## 2022-05-14 DIAGNOSIS — F4310 Post-traumatic stress disorder, unspecified: Secondary | ICD-10-CM | POA: Diagnosis not present

## 2022-05-14 NOTE — Progress Notes (Signed)
Virtual Visit via Video Note   I connected with Meghan [name] on 05/14/22 at 2:00pm by video enabled telemedicine application and verified that I am speaking with the correct person using two identifiers.   I discussed the limitations, risks, security and privacy concerns of performing an evaluation and management service by video and the availability of in person appointments. I also discussed with the patient that there may be a patient responsible charge related to this service. The patient expressed understanding and agreed to proceed.   I discussed the assessment and treatment plan with the patient. The patient was provided an opportunity to ask questions and all were answered. The patient agreed with the plan and demonstrated an understanding of the instructions.   The patient was advised to call back or seek an in-person evaluation if the symptoms worsen or if the condition fails to improve as anticipated.   I provided 47 minutes of non-face-to-face time during this encounter.     [name], LCSW, LCAS _______________________ THERAPIST PROGRESS NOTE   Session Time:  2:00pm - 2:47pm    Location: Patient: Patient Home Provider: Clinical Home Office    Participation Level: Active    Behavioral Response: Alert, casually dressed, euthymic mood/affect     Type of Therapy:  Individual Therapy   Treatment Goals addressed: Mood management; Medication management; Completing autobiography; Maintaining healthy boundaries; Attending church regularly; Exercising regularly; Following safety plan   Progress Towards Goals: Progressing    Interventions: CBT, treatment planning    Summary: Meghan [name] is a 56 year old divorced African American female that presented for therapy appointment today with diagnoses of Major Depressive Disorder, recurrent, moderate, Generalized Anxiety Disorder and PTSD.      Suicidal/Homicidal: None; without plan or intent                                                                                                                 Therapist Response:  Clinician met with Meghan [name] for virtual therapy appointment and assessed for safety, sobriety, and medication compliance.  Meghan [name] presented for session on time and was alert, oriented x5, with no evidence or self-report of active SI/HI or A/V H.  Meghan [name] reported ongoing compliance with medication and denied any use of alcohol or illicit substances.  Clinician inquired about Meghan [name]'s current emotional ratings, as well as any significant changes in thoughts, feelings, or behavior since previous check-in.  Meghan [name] reported scores of 0/10 for depression, 0/10 for anxiety, and 0/10 for anger/irritability.  Meghan [name] denied any recent panic attacks or outbursts.  She reported that a recent success was getting to talk to her grandchildren as they start school again.  She reported that she also spent time with friends and family for self-care, and attended some doctor appointments.  Meghan [name] stated "I felt so much better after talking with them.  I'm going to start focusing on me more".  Meghan [name] denied any new struggles at this time, so clinician recommended revisiting treatment plan today to make revisions as needed based  upon progress and present barriers.  Meghan [name] was agreeable to this, so clinician collaborated with her to make changes as follows with her verbal consent:  Meet with clinician weekly for therapy sessions to update on goal progress and address any needs that arise; Follow up with psychiatrist once every 6 months regarding efficacy of medication and any dose modification necessary; Take medications daily as prescribed to aid in symptom reduction and improvement of daily functioning; Maintain depression at average daily severity of 0/10 for the next 90 days by attending weekly therapy sessions, and engaging in healthy self-care activities daily to keep mind engaged such as reading, going for walks in the park, and speaking with family  x3 weekly for support; Maintain average daily anxiety level at severity of 0/10 over next 90 days by practicing relaxation techniques with proven efficacy 2-3x daily, in addition to challenging anxious thoughts that arise to negate negative impact on outlook; Exercise for at least 30 minutes of cardio daily, in addition to following heart-healthy diet to improve both physical and mental well-being per PCP recommendations; Attend church meetings with clergy members once per week to stay productive, engaged with supportive community, and maintain spiritual support, in addition to any other available activities such as assisting planning committee once per week/once per month; Maintain healthier boundaries with all supports to avoid worsening anxiety and depression, as well as enforce assertive communication skills, with goal of limiting contact with toxic supports until more respectful communication patterns are established; Commit to at least 3 hours of writing per week to stay on track with completion of book by end of 2023 and achieve creative emotional outlet/release from difficult events in past; Increase self-esteem from low of 0/10 up to 3/10 within next 3 months by practicing daily positive affirmations, challenging negative core beliefs related to self-image, and assert needs more readily when interpersonal issues arise; 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  Progress is evidenced by Meghan [name]'s active engagement in regular therapy and medical appointments, medication compliance, maintaining low depression and anxiety levels via  use of self-care and coping skills, maintaining healthy boundaries within network, improving exercise regimen through the week, attending church weekly for support, increasing average self-esteem through change in thought patterns and boundaries, as well as following safety plan.  Meghan [name] reported that she still needs to work to address ongoing history of grief, work on finishing her book before the year ends, and get back into a regular sleep routine.  Clinician will continue to monitor.        Plan: Follow up again in 1 week.    Diagnosis: Major depressive disorder, recurrent, moderate; Generalized Anxiety Disorder; and PTSD   Collaboration of Care:   No collaboration of care required for this visit.                                                   Patient/Guardian was advised Release of Information must be obtained prior to any record release  in order to collaborate their care with an outside provider. Patient/Guardian was advised if they have not already done so to contact the registration department to sign all necessary forms in order for [name] to release information regarding their care.    Consent: Patient/Guardian gives verbal consent for treatment and assignment of benefits for services provided during this visit. Patient/Guardian expressed understanding and agreed to proceed.  [name], LCSW, LCAS 05/14/22

## 2022-05-15 ENCOUNTER — Telehealth: Payer: Self-pay | Admitting: Primary Care

## 2022-05-15 ENCOUNTER — Telehealth: Payer: Self-pay

## 2022-05-15 NOTE — Telephone Encounter (Signed)
Left voicemail for patient to call back to schedule OV with Derl Barrow, NP as she is an established patient, LOV 11/2021.

## 2022-05-15 NOTE — Telephone Encounter (Signed)
Patient called to check the status of a referral for a sleep consult that she said should have been put in.  She stated she has not heard anything yet and she is not sleeping well at all and is very tired when she does wake up.  Please advise and call patient to let her know the status of her referral at [phone]

## 2022-05-15 NOTE — Progress Notes (Signed)
H & P/surgery clearance note [facility] dated 05-15-2022 on chart for 05-28-2022 surgery

## 2022-05-15 NOTE — Telephone Encounter (Signed)
Received H&P from [name], [name]. She stated Meghan [name] is okay for surgery and to hold the xarelto 7 days prior to surgery. I spoke to [facility] and she stated she understood this.

## 2022-05-16 NOTE — Telephone Encounter (Signed)
Patient has scheduled appointment with Beth for 05/24/22 at 11:30 am.

## 2022-05-16 NOTE — Telephone Encounter (Signed)
Received download and sleep study from [employer].  Copy made, one sent to scan and the other given to Geraldo Pitter NP.  Nothing further needed.

## 2022-05-16 NOTE — Telephone Encounter (Signed)
Called and spoke with Leroy Sea with [employer] [facility] regarding sleep study and download from machine.  He states that she has a dream station and had it for a while.  He will try to locate her sleep study and get the download team to get a download from the system with [employer] and fax both to [phone] when he has them.  Will Await fax from [place].

## 2022-05-22 ENCOUNTER — Ambulatory Visit (INDEPENDENT_AMBULATORY_CARE_PROVIDER_SITE_OTHER): Payer: Medicare Other | Admitting: Licensed Clinical Social Worker

## 2022-05-22 DIAGNOSIS — F331 Major depressive disorder, recurrent, moderate: Secondary | ICD-10-CM

## 2022-05-22 DIAGNOSIS — F4310 Post-traumatic stress disorder, unspecified: Secondary | ICD-10-CM | POA: Diagnosis not present

## 2022-05-22 DIAGNOSIS — F411 Generalized anxiety disorder: Secondary | ICD-10-CM

## 2022-05-22 NOTE — Progress Notes (Signed)
Virtual Visit via Video Note   I connected with Meghan [name] on 05/22/22 at 2:00pm by video enabled telemedicine application and verified that I am speaking with the correct person using two identifiers.   I discussed the limitations, risks, security and privacy concerns of performing an evaluation and management service by video and the availability of in person appointments. I also discussed with the patient that there may be a patient responsible charge related to this service. The patient expressed understanding and agreed to proceed.   I discussed the assessment and treatment plan with the patient. The patient was provided an opportunity to ask questions and all were answered. The patient agreed with the plan and demonstrated an understanding of the instructions.   The patient was advised to call back or seek an in-person evaluation if the symptoms worsen or if the condition fails to improve as anticipated.   I provided 1 hour of non-face-to-face time during this encounter.     [name], LCSW, LCAS _______________________ THERAPIST PROGRESS NOTE   Session Time:  2:00pm - 3:00pm   Location: Patient: Patient Home Provider: [facility] [place] Office    Participation Level: Active    Behavioral Response: Alert, casually dressed, depressed mood/affect    Type of Therapy:  Individual Therapy   Treatment Goals addressed: Mood management; Medication management; Maintaining healthy boundaries  Progress Towards Goals: Progressing   Interventions: CBT, communication skills     Summary: Meghan [name] is a 56 year old divorced African American female that presented for therapy appointment today with diagnoses of Major Depressive Disorder, recurrent, moderate, Generalized Anxiety Disorder and PTSD.      Suicidal/Homicidal: None; without plan or intent                                                                                                                Therapist Response:  Clinician met  with Meghan [name] for virtual therapy session and assessed for safety, sobriety, and medication compliance.  Meghan [name] presented for appointment on time and was alert, oriented x5, with no evidence or self-report of active SI/HI or A/V H.  Meghan [name] reported ongoing compliance with medication and denied any use of alcohol or illicit substances.  Clinician inquired about Meghan [name]'s emotional ratings today, as well as any significant changes in thoughts, feelings, or behavior since last check-in.  Meghan [name] reported scores of 5/10 for depression, 5/10 for anxiety, and 10/10 for anger/irritability.  Meghan [name] denied any recent panic attacks or outbursts.  Meghan [name] reported that a recent struggle has been "Feeling like I've been in a funk" over the past few days, leading to increased emotional severity.  Clinician inquired about what triggered this change and how Meghan [name] has attempted to cope.  Meghan [name] reported that on Sunday during a sermon her pastor said something meaningful which resonated with her, and caused her to begin crying uncontrollably.  Meghan [name] reported that because her friends were concerned, they took her to the front where the pastor was, and he stated "Let the attention seeker come out  of you", which greatly upset her since Meghan [name] felt judged, embarrassed, and misunderstood.  Meghan [name] reported that this caused her to shut down and isolate for several days, although Meghan [name] began talking to her support system again today about what happened.  Meghan [name] reported that Meghan [name] wants to return to church, but feels like something needs to be said first due to how upsetting this event was.  Clinician reviewed material with Meghan [name] today on communication skills which could be utilized to increase understanding and support with her pastor.  Clinician presented a handout on 'soft startups' which offered suggestions on how Meghan [name] could address a problem assertively with her pastor at upcoming Sunday sermon, including tips such as choosing an appropriate time/setting, being  mindful of maintaining gentle tone, volume and language, while avoiding triggering nonverbals such as rolling eyes, as well as utilizing "I" statements to express feelings, focusing on one problem at a time, and being respectful.  Intervention was effective, as evidenced by Meghan [name] actively engaging in discussion on subject, and expressing interest in employing several techniques suggested today, such as requesting to speak with the pastor privately before church begins in his office, being mindful of her body language, volume, and tone, as well as using "I" statements to properly articulate how his words and actions hurt her feelings.  Meghan [name] stated "I was too angry to speak to him that day.  I just wanted to get out of there so I held my tongue. I was proud of myself for keeping my composure, but I need to put my 'big girl pants' on and tell him how I feel".  Clinician will continue to monitor.        Plan: Follow up again in 1 week.    Diagnosis: Major depressive disorder, recurrent, moderate; Generalized Anxiety Disorder; and PTSD   Collaboration of Care:   No collaboration of care required for this visit.                                                   Patient/Guardian was advised Release of Information must be obtained prior to any record release in order to collaborate their care with an outside provider. Patient/Guardian was advised if they have not already done so to contact the registration department to sign all necessary forms in order for [name] to release information regarding their care.    Consent: Patient/Guardian gives verbal consent for treatment and assignment of benefits for services provided during this visit. Patient/Guardian expressed understanding and agreed to proceed.  [name], LCSW, LCAS 05/22/22

## 2022-05-24 ENCOUNTER — Ambulatory Visit (INDEPENDENT_AMBULATORY_CARE_PROVIDER_SITE_OTHER): Payer: Medicare Other | Admitting: Primary Care

## 2022-05-24 ENCOUNTER — Encounter: Payer: Self-pay | Admitting: Primary Care

## 2022-05-24 VITALS — BP 102/64 | HR 99 | Temp 98.4°F | Ht 66.0 in | Wt 296.0 lb

## 2022-05-24 DIAGNOSIS — G4700 Insomnia, unspecified: Secondary | ICD-10-CM | POA: Diagnosis not present

## 2022-05-24 DIAGNOSIS — G4733 Obstructive sleep apnea (adult) (pediatric): Secondary | ICD-10-CM

## 2022-05-24 MED ORDER — HYDROXYZINE HCL 25 MG PO TABS: ORAL_TABLET | ORAL | 0 refills | Status: DC

## 2022-05-24 NOTE — Assessment & Plan Note (Addendum)
-   Sleep study on 05/16/2019 showed evidence of severe obstructive sleep apnea, overall AHI 32.1 an hour.  Patient is maintained on CPAP, previous pressure settings were 13 cm H2O.  She reports compliance with use however [employer] download is not up-to-date and she does not have an SD card.  She continues to have interrupted fragmented sleep.  Recommend CPAP titration study in lab. FU in 4-6 weeks.

## 2022-05-24 NOTE — Assessment & Plan Note (Signed)
-    She has difficulty maintaining sleep.  She will have a hard time falling back to sleep once she is awake.  She has tried melatonin and ZzzQuil without improvement.  Sending in Rx for Atarax 25 to 50 mg at bedtime for insomnia.

## 2022-05-24 NOTE — Progress Notes (Signed)
$'@Patient'k$  ID: Meghan [name], female    DOB: Jun 06, 1966, 56 y.o.   MRN: [id number]  Chief Complaint  Patient presents with   Follow-up    Not sleeping, only 2 hrs. At a time, wearing CPAP.    Referring provider: Beverley Fiedler, *  HPI: 56 year old female, never smoked.  Past medical history significant for asthma, obstructive sleep apnea, A-fib, obesity.  Previous [facility] pulmonry encounter: 11/16/2021 Patient presents today for sleep consult. She is here to establish as a new patient. She has a history of sleep apnea, currently on CPAP. She wears CPAP every night. She is unsure setting. Over the last two months she has been consistently waking up at 2-3am and reports having trouble falling back asleep. She goes to bed around 9-10pm and starts her day between 7:30-8am. She has tried several different types of melatonin without improvement. She did not want to take diphenhydramine d.t kidneys. Her current resmed CPAP machine is working well. She wears size large full face mask. No large airleaks that she is aware of. She recently received new supplies. DME compnay is [employer].   Sleep questionnaire Symptoms- Snoring, restless sleep, daytime sleepiness      Prior sleep study- In 2020 ( we do not have copy) Bedtime- 9-10pm  Time to fall asleep- 15-20 mins Nocturnal awakenings- once around 2-3am  Out of bed/start of day- 7:30-8am  Weight changes- down 48lbs Do you operate heavy machinery- No Do you currently wear CPAP- Yes Do you current wear oxygen- Yes [facility]- 16   05/24/2022- Interim hx  Patient returns today for sleep follow-up.  She continues to have symptoms of restless sleep.  She wakes up on average every 90 minutes and has trouble falling back to sleep.  She has tried melatonin and ZzzQuil without improvement.  Typical bedtime is 10 PM.  She starts her day at 7:30 AM.  She reports compliance with CPAP however we only have a download from September 2022 to October 2022 she does not  have an SD card in her machine.  Previous CPAP pressure was 13 cm H2O and she had a residual AHI of 1.7.  She had large amount of air leaks.  She tells me that she has received a new CPAP facemask.  Her DME company is [employer].  We received a copy of her sleep study results from May 16, 2019 that showed evidence of severe obstructive sleep apnea, overall AHI was 32.1 an hour with desaturations to 62%.  She had severe snoring.  Optimal CPAP pressure at that time was 9 cm H2O.   Allergies  Allergen Reactions   Acetaminophen-Codeine Swelling, Rash and Other (See Comments)    Tylenol with Codeine, Tylenol #3,  (facial swelling, hives)   Meloxicam Anaphylaxis and Rash   Tramadol Hives and Other (See Comments)    Patient stated "I was trippin' and I do not want that ever again"   Codeine Swelling    hives   Coconut (Cocos Nucifera) Rash and Other (See Comments)    ANY coconut products    Tomato Rash    Fresh     Immunization History  Administered Date(s) Administered   Influenza Split 05/22/2019, 07/18/2021   Influenza, High Dose Seasonal PF 05/22/2019   Influenza-Unspecified 05/28/2019   PFIZER(Purple Top)SARS-COV-2 Vaccination 12/18/2019, 01/18/2020    Past Medical History:  Diagnosis Date   Anxiety    Arthritis    left knee   Asthma    Atrial fibrillation ([place]) 02/16/2019  CHF (congestive heart failure) ([place]) 01/2019   Physician from [place] informed her she had this    Diabetes mellitus without complication ([place])    Dysrhythmia    Atrial fibrillation   GERD (gastroesophageal reflux disease)    Hiatal hernia    History of pulmonary embolus (PE)    Hypercholesterolemia    Macular degeneration    of left eye   MDD (major depressive disorder)    Osteoarthritis    knees   Pneumonia    in the past   PTSD (post-traumatic stress disorder)    Pulmonary embolism ([place])    bilateral, none since 2003   Seizure disorder ([place])    Per patient last seizure was in 2020.   Sleep apnea  CPAP   [facility]  Uses CPAP   Stroke ([place])    1989 and 1995 (left sided weakness)   Vitamin D deficiency     Tobacco History: Social History   Tobacco Use  Smoking Status Never  Smokeless Tobacco Never   Counseling given: Not Answered   Outpatient Medications Prior to Visit  Medication Sig Dispense Refill   acetaminophen (TYLENOL) 500 MG tablet Take 1,000 mg by mouth every 6 (six) hours as needed for moderate pain.     albuterol (VENTOLIN HFA) 108 (90 Base) MCG/ACT inhaler Inhale 2 puffs into the lungs every 6 (six) hours as needed for wheezing or shortness of breath.     atorvastatin (LIPITOR) 40 MG tablet Take 40 mg by mouth every evening.     BREZTRI AEROSPHERE 160-9-4.8 MCG/ACT AERO Inhale 2 puffs into the lungs in the morning and at bedtime.      bumetanide (BUMEX) 1 MG tablet Take 1 mg by mouth daily.     Cholecalciferol (VITAMIN D-3) 25 MCG (1000 UT) CAPS Take 1,000 Units by mouth daily.     flecainide (TAMBOCOR) 50 MG tablet Take 50 mg by mouth 2 (two) times daily.      ketoconazole (NIZORAL) 2 % cream Apply to bottoms of feet daily for 4 weeks. (Patient taking differently: 1 application  in the morning, at noon, and at bedtime.) 60 g 4   levETIRAcetam (KEPPRA) 500 MG tablet Take 1 tablet (500 mg total) by mouth 2 (two) times daily. 180 tablet 3   metFORMIN (GLUCOPHAGE) 500 MG tablet Take 500 mg by mouth daily with breakfast.     Omega-3 Fatty Acids (FISH OIL) 1000 MG CAPS Take 1,000 mg by mouth daily with breakfast.      omeprazole (PRILOSEC) 40 MG capsule Take 40 mg by mouth daily.     rivaroxaban (XARELTO) 20 MG TABS tablet Take 20 mg by mouth at bedtime.      RYBELSUS 14 MG TABS Take 14 mg by mouth daily.     sertraline (ZOLOFT) 50 MG tablet Take 50 mg by mouth daily.     methylPREDNISolone (MEDROL DOSEPAK) 4 MG TBPK tablet Take as directed 21 each 0   metoCLOPramide (REGLAN) 10 MG tablet Take 1 tablet (10 mg total) by mouth every 6 (six) hours as needed for nausea  (nausea/headache). 10 tablet 0   metoprolol succinate (TOPROL-XL) 25 MG 24 hr tablet Take 25 mg by mouth daily.     ondansetron (ZOFRAN-ODT) 4 MG disintegrating tablet Take 1 tablet (4 mg total) by mouth every 8 (eight) hours as needed for nausea or vomiting. 20 tablet 0   oxyCODONE (ROXICODONE) 5 MG immediate release tablet 1 tab PO q6 hours prn pain 20 tablet 0  No facility-administered medications prior to visit.   Review of Systems  Review of Systems  Constitutional:  Positive for fatigue.  HENT: Negative.    Respiratory: Negative.    Cardiovascular: Negative.   Psychiatric/Behavioral:  Positive for sleep disturbance.      Physical Exam  BP 102/64 (BP Location: Left Arm, Cuff Size: Large)   Pulse 99   Temp 98.4 F (36.9 C) (Temporal)   Ht '5\' 6"'$  (1.676 m)   Wt 296 lb (134.3 kg)   SpO2 100%   BMI 47.78 kg/m  Physical Exam Constitutional:      General: She is not in acute distress.    Appearance: Normal appearance. She is obese. She is not ill-appearing.  HENT:     Head: Normocephalic and atraumatic.     Mouth/Throat:     Mouth: Mucous membranes are moist.     Pharynx: Oropharynx is clear.  Cardiovascular:     Rate and Rhythm: Normal rate and regular rhythm.  Pulmonary:     Effort: Pulmonary effort is normal.     Breath sounds: Normal breath sounds.     Comments: CTA Musculoskeletal:     Cervical back: Normal range of motion and neck supple.  Skin:    General: Skin is warm and dry.  Neurological:     General: No focal deficit present.     Mental Status: She is alert and oriented to person, place, and time. Mental status is at baseline.  Psychiatric:        Mood and Affect: Mood normal.        Behavior: Behavior normal.        Thought Content: Thought content normal.        Judgment: Judgment normal.      Lab Results:  CBC    Component Value Date/Time   WBC 9.0 01/01/2022 1040   RBC 3.93 01/01/2022 1040   HGB 10.3 (L) 01/01/2022 1040   HCT 33.6 (L)  01/01/2022 1040   PLT 235 01/01/2022 1040   MCV 85.5 01/01/2022 1040   MCH 26.2 01/01/2022 1040   MCHC 30.7 01/01/2022 1040   RDW 14.7 01/01/2022 1040   LYMPHSABS 1.4 11/27/2021 0931   MONOABS 0.4 11/27/2021 0931   EOSABS 0.1 11/27/2021 0931   BASOSABS 0.0 11/27/2021 0931    BMET    Component Value Date/Time   NA 140 01/01/2022 1040   K 4.4 01/01/2022 1040   CL 108 01/01/2022 1040   CO2 27 01/01/2022 1040   GLUCOSE 93 01/01/2022 1040   BUN 17 01/01/2022 1040   CREATININE 0.76 01/01/2022 1040   CALCIUM 9.8 01/01/2022 1040   GFRNONAA >60 01/01/2022 1040   GFRAA >60 05/11/2020 1701    BNP    Component Value Date/Time   BNP 92.4 02/01/2021 1034    ProBNP No results found for: "PROBNP"  Imaging: No results found.   Assessment & Plan:   Obstructive sleep apnea syndrome - Sleep study on 05/16/2019 showed evidence of severe obstructive sleep apnea, overall AHI 32.1 an hour.  Patient is maintained on CPAP, previous pressure settings were 13 cm H2O.  She reports compliance with use however [employer] download is not up-to-date and she does not have an SD card.  She continues to have interrupted fragmented sleep.  Recommend CPAP titration study in lab. FU in 4-6 weeks.   Insomnia -  She has difficulty maintaining sleep.  She will have a hard time falling back to sleep once she is awake.  She has tried melatonin and ZzzQuil without improvement.  Sending in Rx for Atarax 25 to 50 mg at bedtime for insomnia.      Martyn Ehrich, NP 05/24/2022

## 2022-05-24 NOTE — H&P (View-Only) (Signed)
$'@Patient'u$  ID: Meghan [name], female    DOB: 1965-09-23, 56 y.o.   MRN: [id number]  Chief Complaint  Patient presents with   Follow-up    Not sleeping, only 2 hrs. At a time, wearing CPAP.    Referring provider: Beverley Fiedler, *  HPI: 56 year old female, never smoked.  Past medical history significant for asthma, obstructive sleep apnea, A-fib, obesity.  Previous [facility] pulmonry encounter: 11/16/2021 Patient presents today for sleep consult. She is here to establish as a new patient. She has a history of sleep apnea, currently on CPAP. She wears CPAP every night. She is unsure setting. Over the last two months she has been consistently waking up at 2-3am and reports having trouble falling back asleep. She goes to bed around 9-10pm and starts her day between 7:30-8am. She has tried several different types of melatonin without improvement. She did not want to take diphenhydramine d.t kidneys. Her current resmed CPAP machine is working well. She wears size large full face mask. No large airleaks that she is aware of. She recently received new supplies. DME compnay is [employer].   Sleep questionnaire Symptoms- Snoring, restless sleep, daytime sleepiness      Prior sleep study- In 2020 ( we do not have copy) Bedtime- 9-10pm  Time to fall asleep- 15-20 mins Nocturnal awakenings- once around 2-3am  Out of bed/start of day- 7:30-8am  Weight changes- down 48lbs Do you operate heavy machinery- No Do you currently wear CPAP- Yes Do you current wear oxygen- Yes [facility]- 16   05/24/2022- Interim hx  Patient returns today for sleep follow-up.  She continues to have symptoms of restless sleep.  She wakes up on average every 90 minutes and has trouble falling back to sleep.  She has tried melatonin and ZzzQuil without improvement.  Typical bedtime is 10 PM.  She starts her day at 7:30 AM.  She reports compliance with CPAP however we only have a download from September 2022 to October 2022 she does not  have an SD card in her machine.  Previous CPAP pressure was 13 cm H2O and she had a residual AHI of 1.7.  She had large amount of air leaks.  She tells me that she has received a new CPAP facemask.  Her DME company is [employer].  We received a copy of her sleep study results from May 16, 2019 that showed evidence of severe obstructive sleep apnea, overall AHI was 32.1 an hour with desaturations to 62%.  She had severe snoring.  Optimal CPAP pressure at that time was 9 cm H2O.   Allergies  Allergen Reactions   Acetaminophen-Codeine Swelling, Rash and Other (See Comments)    Tylenol with Codeine, Tylenol #3,  (facial swelling, hives)   Meloxicam Anaphylaxis and Rash   Tramadol Hives and Other (See Comments)    Patient stated "I was trippin' and I do not want that ever again"   Codeine Swelling    hives   Coconut (Cocos Nucifera) Rash and Other (See Comments)    ANY coconut products    Tomato Rash    Fresh     Immunization History  Administered Date(s) Administered   Influenza Split 05/22/2019, 07/18/2021   Influenza, High Dose Seasonal PF 05/22/2019   Influenza-Unspecified 05/28/2019   PFIZER(Purple Top)SARS-COV-2 Vaccination 12/18/2019, 01/18/2020    Past Medical History:  Diagnosis Date   Anxiety    Arthritis    left knee   Asthma    Atrial fibrillation ([place]) 02/16/2019  CHF (congestive heart failure) ([place]) 01/2019   Physician from [place] informed her she had this    Diabetes mellitus without complication ([place])    Dysrhythmia    Atrial fibrillation   GERD (gastroesophageal reflux disease)    Hiatal hernia    History of pulmonary embolus ([place])    Hypercholesterolemia    Macular degeneration    of left eye   MDD (major depressive disorder)    Osteoarthritis    knees   Pneumonia    in the past   PTSD (post-traumatic stress disorder)    Pulmonary embolism ([place])    bilateral, none since 2003   Seizure disorder ([place])    Per patient last seizure was in 2020.   Sleep apnea  CPAP   [facility]  Uses CPAP   Stroke ([place])    1989 and 1995 (left sided weakness)   Vitamin D deficiency     Tobacco History: Social History   Tobacco Use  Smoking Status Never  Smokeless Tobacco Never   Counseling given: Not Answered   Outpatient Medications Prior to Visit  Medication Sig Dispense Refill   acetaminophen (TYLENOL) 500 MG tablet Take 1,000 mg by mouth every 6 (six) hours as needed for moderate pain.     albuterol (VENTOLIN HFA) 108 (90 Base) MCG/ACT inhaler Inhale 2 puffs into the lungs every 6 (six) hours as needed for wheezing or shortness of breath.     atorvastatin (LIPITOR) 40 MG tablet Take 40 mg by mouth every evening.     BREZTRI AEROSPHERE 160-9-4.8 MCG/ACT AERO Inhale 2 puffs into the lungs in the morning and at bedtime.      bumetanide (BUMEX) 1 MG tablet Take 1 mg by mouth daily.     Cholecalciferol (VITAMIN D-3) 25 MCG (1000 UT) CAPS Take 1,000 Units by mouth daily.     flecainide (TAMBOCOR) 50 MG tablet Take 50 mg by mouth 2 (two) times daily.      ketoconazole (NIZORAL) 2 % cream Apply to bottoms of feet daily for 4 weeks. (Patient taking differently: 1 application  in the morning, at noon, and at bedtime.) 60 g 4   levETIRAcetam (KEPPRA) 500 MG tablet Take 1 tablet (500 mg total) by mouth 2 (two) times daily. 180 tablet 3   metFORMIN (GLUCOPHAGE) 500 MG tablet Take 500 mg by mouth daily with breakfast.     Omega-3 Fatty Acids (FISH OIL) 1000 MG CAPS Take 1,000 mg by mouth daily with breakfast.      omeprazole (PRILOSEC) 40 MG capsule Take 40 mg by mouth daily.     rivaroxaban (XARELTO) 20 MG TABS tablet Take 20 mg by mouth at bedtime.      RYBELSUS 14 MG TABS Take 14 mg by mouth daily.     sertraline (ZOLOFT) 50 MG tablet Take 50 mg by mouth daily.     methylPREDNISolone (MEDROL DOSEPAK) 4 MG TBPK tablet Take as directed 21 each 0   metoCLOPramide (REGLAN) 10 MG tablet Take 1 tablet (10 mg total) by mouth every 6 (six) hours as needed for nausea  (nausea/headache). 10 tablet 0   metoprolol succinate (TOPROL-XL) 25 MG 24 hr tablet Take 25 mg by mouth daily.     ondansetron (ZOFRAN-ODT) 4 MG disintegrating tablet Take 1 tablet (4 mg total) by mouth every 8 (eight) hours as needed for nausea or vomiting. 20 tablet 0   oxyCODONE (ROXICODONE) 5 MG immediate release tablet 1 tab PO q6 hours prn pain 20 tablet 0  No facility-administered medications prior to visit.   Review of Systems  Review of Systems  Constitutional:  Positive for fatigue.  HENT: Negative.    Respiratory: Negative.    Cardiovascular: Negative.   Psychiatric/Behavioral:  Positive for sleep disturbance.      Physical Exam  BP 102/64 (BP Location: Left Arm, Cuff Size: Large)   Pulse 99   Temp 98.4 F (36.9 C) (Temporal)   Ht '5\' 6"'$  (1.676 m)   Wt 296 lb (134.3 kg)   SpO2 100%   BMI 47.78 kg/m  Physical Exam Constitutional:      General: She is not in acute distress.    Appearance: Normal appearance. She is obese. She is not ill-appearing.  HENT:     Head: Normocephalic and atraumatic.     Mouth/Throat:     Mouth: Mucous membranes are moist.     Pharynx: Oropharynx is clear.  Cardiovascular:     Rate and Rhythm: Normal rate and regular rhythm.  Pulmonary:     Effort: Pulmonary effort is normal.     Breath sounds: Normal breath sounds.     Comments: CTA Musculoskeletal:     Cervical back: Normal range of motion and neck supple.  Skin:    General: Skin is warm and dry.  Neurological:     General: No focal deficit present.     Mental Status: She is alert and oriented to person, place, and time. Mental status is at baseline.  Psychiatric:        Mood and Affect: Mood normal.        Behavior: Behavior normal.        Thought Content: Thought content normal.        Judgment: Judgment normal.      Lab Results:  CBC    Component Value Date/Time   WBC 9.0 01/01/2022 1040   RBC 3.93 01/01/2022 1040   HGB 10.3 (L) 01/01/2022 1040   HCT 33.6 (L)  01/01/2022 1040   PLT 235 01/01/2022 1040   MCV 85.5 01/01/2022 1040   MCH 26.2 01/01/2022 1040   MCHC 30.7 01/01/2022 1040   RDW 14.7 01/01/2022 1040   LYMPHSABS 1.4 11/27/2021 0931   MONOABS 0.4 11/27/2021 0931   EOSABS 0.1 11/27/2021 0931   BASOSABS 0.0 11/27/2021 0931    BMET    Component Value Date/Time   NA 140 01/01/2022 1040   K 4.4 01/01/2022 1040   CL 108 01/01/2022 1040   CO2 27 01/01/2022 1040   GLUCOSE 93 01/01/2022 1040   BUN 17 01/01/2022 1040   CREATININE 0.76 01/01/2022 1040   CALCIUM 9.8 01/01/2022 1040   GFRNONAA >60 01/01/2022 1040   GFRAA >60 05/11/2020 1701    BNP    Component Value Date/Time   BNP 92.4 02/01/2021 1034    ProBNP No results found for: "PROBNP"  Imaging: No results found.   Assessment & Plan:   Obstructive sleep apnea syndrome - Sleep study on 05/16/2019 showed evidence of severe obstructive sleep apnea, overall AHI 32.1 an hour.  Patient is maintained on CPAP, previous pressure settings were 13 cm H2O.  She reports compliance with use however [employer] download is not up-to-date and she does not have an SD card.  She continues to have interrupted fragmented sleep.  Recommend CPAP titration study in lab. FU in 4-6 weeks.   Insomnia -  She has difficulty maintaining sleep.  She will have a hard time falling back to sleep once she is awake.  She has tried melatonin and ZzzQuil without improvement.  Sending in Rx for Atarax 25 to 50 mg at bedtime for insomnia.      Martyn Ehrich, NP 05/24/2022

## 2022-05-24 NOTE — Patient Instructions (Addendum)
Recommendations - Continue her CPAP every night for minimum 4 to 6 hours - Avoid sleeping directly on your back.  If you must sleep on your back please look at getting a wedge pillow to elevate your head 30 degrees - I sent a medication in to help with your sleep, please do not combine with any other sedating medications, over-the-counter sleep aids or alcohol  Orders - CPAP titration  Rx - Atarax 25 mg - take 1 to 2 tablets at bedtime for insomnia  Follow-up: - 4-6 weeks with Eustaquio Maize NP   Insomnia Insomnia is a sleep disorder that makes it difficult to fall asleep or stay asleep. Insomnia can cause fatigue, low energy, difficulty concentrating, mood swings, and poor performance at work or school. There are three different ways to classify insomnia: Difficulty falling asleep. Difficulty staying asleep. Waking up too early in the morning. Any type of insomnia can be long-term (chronic) or short-term (acute). Both are common. Short-term insomnia usually lasts for 3 months or less. Chronic insomnia occurs at least three times a week for longer than 3 months. What are the causes? Insomnia may be caused by another condition, situation, or substance, such as: Having certain mental health conditions, such as anxiety and depression. Using caffeine, alcohol, tobacco, or drugs. Having gastrointestinal conditions, such as gastroesophageal reflux disease (GERD). Having certain medical conditions. These include: Asthma. Alzheimer's disease. Stroke. Chronic pain. An overactive thyroid gland (hyperthyroidism). Other sleep disorders, such as restless legs syndrome and sleep apnea. Menopause. Sometimes, the cause of insomnia may not be known. What increases the risk? Risk factors for insomnia include: Gender. Females are affected more often than males. Age. Insomnia is more common as people get older. Stress and certain medical and mental health conditions. Lack of exercise. Having an irregular  work schedule. This may include working night shifts and traveling between different time zones. What are the signs or symptoms? If you have insomnia, the main symptom is having trouble falling asleep or having trouble staying asleep. This may lead to other symptoms, such as: Feeling tired or having low energy. Feeling nervous about going to sleep. Not feeling rested in the morning. Having trouble concentrating. Feeling irritable, anxious, or depressed. How is this diagnosed? This condition may be diagnosed based on: Your symptoms and medical history. Your health care provider may ask about: Your sleep habits. Any medical conditions you have. Your mental health. A physical exam. How is this treated? Treatment for insomnia depends on the cause. Treatment may focus on treating an underlying condition that is causing the insomnia. Treatment may also include: Medicines to help you sleep. Counseling or therapy. Lifestyle adjustments to help you sleep better. Follow these instructions at home: Eating and drinking  Limit or avoid alcohol, caffeinated beverages, and products that contain nicotine and tobacco, especially close to bedtime. These can disrupt your sleep. Do not eat a large meal or eat spicy foods right before bedtime. This can lead to digestive discomfort that can make it hard for you to sleep. Sleep habits  Keep a sleep diary to help you and your health care provider figure out what could be causing your insomnia. Write down: When you sleep. When you wake up during the night. How well you sleep and how rested you feel the next day. Any side effects of medicines you are taking. What you eat and drink. Make your bedroom a dark, comfortable place where it is easy to fall asleep. Put up shades or blackout curtains to block  light from outside. Use a white noise machine to block noise. Keep the temperature cool. Limit screen use before bedtime. This includes: Not watching  TV. Not using your smartphone, tablet, or computer. Stick to a routine that includes going to bed and waking up at the same times every day and night. This can help you fall asleep faster. Consider making a quiet activity, such as reading, part of your nighttime routine. Try to avoid taking naps during the day so that you sleep better at night. Get out of bed if you are still awake after 15 minutes of trying to sleep. Keep the lights down, but try reading or doing a quiet activity. When you feel sleepy, go back to bed. General instructions Take over-the-counter and prescription medicines only as told by your health care provider. Exercise regularly as told by your health care provider. However, avoid exercising in the hours right before bedtime. Use relaxation techniques to manage stress. Ask your health care provider to suggest some techniques that may work well for you. These may include: Breathing exercises. Routines to release muscle tension. Visualizing peaceful scenes. Make sure that you drive carefully. Do not drive if you feel very sleepy. Keep all follow-up visits. This is important. Contact a health care provider if: You are tired throughout the day. You have trouble in your daily routine due to sleepiness. You continue to have sleep problems, or your sleep problems get worse. Get help right away if: You have thoughts about hurting yourself or someone else. Get help right away if you feel like you may hurt yourself or others, or have thoughts about taking your own life. Go to your nearest emergency room or: Call 911. Call the [place] at [phone] or 988. This is open 24 hours a day. Text the Crisis Text Line at (867) 610-7285. Summary Insomnia is a sleep disorder that makes it difficult to fall asleep or stay asleep. Insomnia can be long-term (chronic) or short-term (acute). Treatment for insomnia depends on the cause. Treatment may focus on treating an  underlying condition that is causing the insomnia. Keep a sleep diary to help you and your health care provider figure out what could be causing your insomnia. This information is not intended to replace advice given to you by your health care provider. Make sure you discuss any questions you have with your health care provider. Document Revised: 08/14/2021 Document Reviewed: 08/14/2021 Elsevier Patient Education  [place].

## 2022-05-27 NOTE — Anesthesia Preprocedure Evaluation (Signed)
Anesthesia Evaluation  Patient identified by MRN, date of birth, ID band Patient awake    Reviewed: Allergy & Precautions, H&P , NPO status , Patient's Chart, lab work & pertinent test results  Airway Mallampati: II  TM Distance: >3 FB Neck ROM: Full    Dental no notable dental hx. (+) Dental Advisory Given   Pulmonary asthma , sleep apnea , pneumonia,    Pulmonary exam normal breath sounds clear to auscultation       Cardiovascular Pt. on home beta blockers +CHF  Normal cardiovascular exam+ dysrhythmias Atrial Fibrillation  Rhythm:Regular Rate:Normal  From preop note 01/2022 Echo 08/16/20:  "Moderate concentric left ventricular hypertrophy with septum 1.4 cm and basal inferior lateral wall 1.3 cm. Left atrium was mildly dilated at 38 mL/m with normal right ventricular size and function. Ejection fraction was 65% with pulmonary artery pressure estimated at 32 mmHg."  She reported had a cath in 2014 or 2018. (According to 03/10/20 [facility] cardiology office note by Dr. Shirlee More: "Echocardiogram 04/2019. Normal LV EF 60-65%. Moderate 2+ tricuspid regurgitation.Marland KitchenMarland KitchenNo history of coronary artery disease; post normal coronary angiogram in 2018" at [facility].    Neuro/Psych Seizures -,  PSYCHIATRIC DISORDERS Anxiety Depression  Neuromuscular disease CVA, Residual Symptoms negative neurological ROS     GI/Hepatic Neg liver ROS, hiatal hernia, GERD  ,  Endo/Other  diabetes, Type 2Morbid obesity  Renal/GU negative Renal ROS     Musculoskeletal  (+) Arthritis ,   Abdominal (+) + obese,   Peds  Hematology negative hematology ROS (+)   Anesthesia Other Findings   Reproductive/Obstetrics                            Anesthesia Physical  Anesthesia Plan  ASA: 3  Anesthesia Plan: General   Post-op Pain Management: Regional block* and Tylenol PO (pre-op)*   Induction:  Intravenous  PONV Risk Score and Plan: 3 and Ondansetron, Dexamethasone, Treatment may vary due to age or medical condition and Midazolam  Airway Management Planned: Oral ETT  Additional Equipment:   Intra-op Plan:   Post-operative Plan: Extubation in OR  Informed Consent: I have reviewed the patients History and Physical, chart, labs and discussed the procedure including the risks, benefits and alternatives for the proposed anesthesia with the patient or authorized representative who has indicated his/her understanding and acceptance.     Dental advisory given  Plan Discussed with: CRNA  Anesthesia Plan Comments: (PAT note written 01/24/2022 by Myra Gianotti, PA-C. )       Anesthesia Quick Evaluation

## 2022-05-28 ENCOUNTER — Ambulatory Visit (HOSPITAL_BASED_OUTPATIENT_CLINIC_OR_DEPARTMENT_OTHER): Payer: Medicare Other | Admitting: Anesthesiology

## 2022-05-28 ENCOUNTER — Encounter (HOSPITAL_BASED_OUTPATIENT_CLINIC_OR_DEPARTMENT_OTHER): Payer: Self-pay | Admitting: Podiatry

## 2022-05-28 ENCOUNTER — Encounter (HOSPITAL_BASED_OUTPATIENT_CLINIC_OR_DEPARTMENT_OTHER)
Admission: RE | Disposition: A | Discharge status: Home / Self Care | Payer: Self-pay | Source: Ambulatory Visit | Attending: Podiatry

## 2022-05-28 ENCOUNTER — Encounter: Payer: Self-pay | Admitting: Podiatry

## 2022-05-28 ENCOUNTER — Ambulatory Visit (HOSPITAL_COMMUNITY): Payer: Medicare Other

## 2022-05-28 ENCOUNTER — Other Ambulatory Visit: Payer: Self-pay | Admitting: Podiatry

## 2022-05-28 ENCOUNTER — Ambulatory Visit (HOSPITAL_BASED_OUTPATIENT_CLINIC_OR_DEPARTMENT_OTHER)
Admission: RE | Admit: 2022-05-28 | Discharge: 2022-05-28 | Disposition: A | Discharge status: Home / Self Care | Payer: Medicare Other | Source: Ambulatory Visit | Attending: Podiatry | Admitting: Podiatry

## 2022-05-28 ENCOUNTER — Other Ambulatory Visit: Payer: Self-pay

## 2022-05-28 DIAGNOSIS — Z6841 Body Mass Index (BMI) 40.0 and over, adult: Secondary | ICD-10-CM | POA: Diagnosis not present

## 2022-05-28 DIAGNOSIS — Z8673 Personal history of transient ischemic attack (TIA), and cerebral infarction without residual deficits: Secondary | ICD-10-CM | POA: Insufficient documentation

## 2022-05-28 DIAGNOSIS — R7303 Prediabetes: Secondary | ICD-10-CM

## 2022-05-28 DIAGNOSIS — J45909 Unspecified asthma, uncomplicated: Secondary | ICD-10-CM | POA: Insufficient documentation

## 2022-05-28 DIAGNOSIS — Z7984 Long term (current) use of oral hypoglycemic drugs: Secondary | ICD-10-CM | POA: Insufficient documentation

## 2022-05-28 DIAGNOSIS — I517 Cardiomegaly: Secondary | ICD-10-CM | POA: Diagnosis not present

## 2022-05-28 DIAGNOSIS — I4891 Unspecified atrial fibrillation: Secondary | ICD-10-CM | POA: Diagnosis not present

## 2022-05-28 DIAGNOSIS — F418 Other specified anxiety disorders: Secondary | ICD-10-CM

## 2022-05-28 DIAGNOSIS — E119 Type 2 diabetes mellitus without complications: Secondary | ICD-10-CM | POA: Diagnosis not present

## 2022-05-28 DIAGNOSIS — G4733 Obstructive sleep apnea (adult) (pediatric): Secondary | ICD-10-CM | POA: Diagnosis not present

## 2022-05-28 DIAGNOSIS — K219 Gastro-esophageal reflux disease without esophagitis: Secondary | ICD-10-CM | POA: Insufficient documentation

## 2022-05-28 DIAGNOSIS — I509 Heart failure, unspecified: Secondary | ICD-10-CM | POA: Insufficient documentation

## 2022-05-28 DIAGNOSIS — G709 Myoneural disorder, unspecified: Secondary | ICD-10-CM | POA: Insufficient documentation

## 2022-05-28 DIAGNOSIS — K449 Diaphragmatic hernia without obstruction or gangrene: Secondary | ICD-10-CM | POA: Insufficient documentation

## 2022-05-28 DIAGNOSIS — M19071 Primary osteoarthritis, right ankle and foot: Secondary | ICD-10-CM

## 2022-05-28 HISTORY — PX: HALLUX FUSION: SHX6621

## 2022-05-28 LAB — POCT I-STAT, CHEM 8
BUN: 22 mg/dL — ABNORMAL HIGH (ref 6–20)
Calcium, Ion: 1.39 mmol/L (ref 1.15–1.40)
Chloride: 104 mmol/L (ref 98–111)
Creatinine, Ser: 0.8 mg/dL (ref 0.44–1.00)
Glucose, Bld: 92 mg/dL (ref 70–99)
HCT: 35 % — ABNORMAL LOW (ref 36.0–46.0)
Hemoglobin: 11.9 g/dL — ABNORMAL LOW (ref 12.0–15.0)
Potassium: 4.5 mmol/L (ref 3.5–5.1)
Sodium: 140 mmol/L (ref 135–145)
TCO2: 25 mmol/L (ref 22–32)

## 2022-05-28 LAB — GLUCOSE, CAPILLARY: Glucose-Capillary: 102 mg/dL — ABNORMAL HIGH (ref 70–99)

## 2022-05-28 SURGERY — FUSION, JOINT, GREAT TOE
Anesthesia: General | Site: Foot | Laterality: Right

## 2022-05-28 MED ORDER — DEXAMETHASONE SODIUM PHOSPHATE 10 MG/ML IJ SOLN
INTRAMUSCULAR | Status: DC | PRN
  Administered 2022-05-28: 5 mg via INTRAVENOUS

## 2022-05-28 MED ORDER — SUCCINYLCHOLINE CHLORIDE 200 MG/10ML IV SOSY
PREFILLED_SYRINGE | INTRAVENOUS | Status: AC
  Filled 2022-05-28: qty 10

## 2022-05-28 MED ORDER — AMISULPRIDE (ANTIEMETIC) 5 MG/2ML IV SOLN: 10.0000 mg | Freq: Once | INTRAVENOUS | Status: DC | PRN

## 2022-05-28 MED ORDER — PHENYLEPHRINE 80 MCG/ML (10ML) SYRINGE FOR IV PUSH (FOR BLOOD PRESSURE SUPPORT)
PREFILLED_SYRINGE | INTRAVENOUS | Status: DC | PRN
  Administered 2022-05-28: 80 ug via INTRAVENOUS

## 2022-05-28 MED ORDER — ROCURONIUM BROMIDE 10 MG/ML (PF) SYRINGE
PREFILLED_SYRINGE | INTRAVENOUS | Status: DC | PRN
  Administered 2022-05-28: 20 mg via INTRAVENOUS
  Administered 2022-05-28: 40 mg via INTRAVENOUS

## 2022-05-28 MED ORDER — OXYCODONE HCL 5 MG/5ML PO SOLN: 5.0000 mg | Freq: Once | ORAL | Status: DC | PRN

## 2022-05-28 MED ORDER — PROMETHAZINE HCL 25 MG/ML IJ SOLN
6.2500 mg | INTRAMUSCULAR | Status: DC | PRN
  Administered 2022-05-28: 6.25 mg via INTRAVENOUS

## 2022-05-28 MED ORDER — FENTANYL CITRATE (PF) 100 MCG/2ML IJ SOLN
50.0000 ug | Freq: Once | INTRAMUSCULAR | Status: AC
  Administered 2022-05-28: 50 ug via INTRAVENOUS

## 2022-05-28 MED ORDER — FENTANYL CITRATE (PF) 100 MCG/2ML IJ SOLN
INTRAMUSCULAR | Status: DC | PRN
  Administered 2022-05-28: 50 ug via INTRAVENOUS

## 2022-05-28 MED ORDER — ACETAMINOPHEN 500 MG PO TABS
1000.0000 mg | ORAL_TABLET | Freq: Once | ORAL | Status: AC
  Administered 2022-05-28: 1000 mg via ORAL

## 2022-05-28 MED ORDER — FENTANYL CITRATE (PF) 100 MCG/2ML IJ SOLN
INTRAMUSCULAR | Status: AC
  Filled 2022-05-28: qty 2

## 2022-05-28 MED ORDER — ONDANSETRON HCL 4 MG/2ML IJ SOLN
INTRAMUSCULAR | Status: AC
  Filled 2022-05-28: qty 2

## 2022-05-28 MED ORDER — MIDAZOLAM HCL 2 MG/2ML IJ SOLN
INTRAMUSCULAR | Status: AC
  Filled 2022-05-28: qty 2

## 2022-05-28 MED ORDER — CEFAZOLIN IN SODIUM CHLORIDE 3-0.9 GM/100ML-% IV SOLN
INTRAVENOUS | Status: AC
  Filled 2022-05-28: qty 100

## 2022-05-28 MED ORDER — MIDAZOLAM HCL 2 MG/2ML IJ SOLN
1.0000 mg | Freq: Once | INTRAMUSCULAR | Status: AC
  Administered 2022-05-28: 1 mg via INTRAVENOUS

## 2022-05-28 MED ORDER — LIDOCAINE 2% (20 MG/ML) 5 ML SYRINGE
INTRAMUSCULAR | Status: DC | PRN
  Administered 2022-05-28: 100 mg via INTRAVENOUS

## 2022-05-28 MED ORDER — CEFAZOLIN (ANCEF) 1 G IV SOLR
1.0000 g | INTRAVENOUS | Status: DC
Start: 2022-05-28 — End: 2022-05-28
  Filled 2022-05-28: qty 1

## 2022-05-28 MED ORDER — PROPOFOL 10 MG/ML IV BOLUS
INTRAVENOUS | Status: DC | PRN
  Administered 2022-05-28: 150 mg via INTRAVENOUS

## 2022-05-28 MED ORDER — DEXAMETHASONE SODIUM PHOSPHATE 10 MG/ML IJ SOLN
INTRAMUSCULAR | Status: AC
  Filled 2022-05-28: qty 1

## 2022-05-28 MED ORDER — LACTATED RINGERS IV SOLN: INTRAVENOUS | Status: DC

## 2022-05-28 MED ORDER — SUGAMMADEX SODIUM 200 MG/2ML IV SOLN
INTRAVENOUS | Status: DC | PRN
  Administered 2022-05-28: 100 mg via INTRAVENOUS

## 2022-05-28 MED ORDER — ACETAMINOPHEN 500 MG PO TABS
ORAL_TABLET | ORAL | Status: AC
  Filled 2022-05-28: qty 2

## 2022-05-28 MED ORDER — SUCCINYLCHOLINE CHLORIDE 200 MG/10ML IV SOSY
PREFILLED_SYRINGE | INTRAVENOUS | Status: DC | PRN
  Administered 2022-05-28: 120 mg via INTRAVENOUS

## 2022-05-28 MED ORDER — PROPOFOL 1000 MG/100ML IV EMUL
INTRAVENOUS | Status: AC
  Filled 2022-05-28: qty 100

## 2022-05-28 MED ORDER — PHENYLEPHRINE HCL (PRESSORS) 10 MG/ML IV SOLN
INTRAVENOUS | Status: DC | PRN
  Administered 2022-05-28: 80 ug via INTRAVENOUS

## 2022-05-28 MED ORDER — EPHEDRINE 5 MG/ML INJ
INTRAVENOUS | Status: AC
  Filled 2022-05-28: qty 5

## 2022-05-28 MED ORDER — CLONIDINE HCL (ANALGESIA) 100 MCG/ML EP SOLN
EPIDURAL | Status: DC | PRN
  Administered 2022-05-28: 80 ug

## 2022-05-28 MED ORDER — HYDROMORPHONE HCL 1 MG/ML IJ SOLN: 0.2500 mg | INTRAMUSCULAR | Status: DC | PRN

## 2022-05-28 MED ORDER — OXYCODONE HCL 5 MG PO TABS: 5.0000 mg | ORAL_TABLET | Freq: Once | ORAL | Status: DC | PRN

## 2022-05-28 MED ORDER — PROMETHAZINE HCL 25 MG/ML IJ SOLN
INTRAMUSCULAR | Status: AC
  Filled 2022-05-28: qty 1

## 2022-05-28 MED ORDER — ROCURONIUM BROMIDE 10 MG/ML (PF) SYRINGE
PREFILLED_SYRINGE | INTRAVENOUS | Status: AC
  Filled 2022-05-28: qty 10

## 2022-05-28 MED ORDER — CEFAZOLIN IN SODIUM CHLORIDE 3-0.9 GM/100ML-% IV SOLN
3.0000 g | Freq: Once | INTRAVENOUS | Status: AC
  Administered 2022-05-28: 3 g via INTRAVENOUS

## 2022-05-28 MED ORDER — MEPERIDINE HCL 25 MG/ML IJ SOLN: 6.2500 mg | INTRAMUSCULAR | Status: DC | PRN

## 2022-05-28 MED ORDER — ONDANSETRON HCL 4 MG/2ML IJ SOLN
INTRAMUSCULAR | Status: DC | PRN
  Administered 2022-05-28: 4 mg via INTRAVENOUS

## 2022-05-28 MED ORDER — DEXAMETHASONE SODIUM PHOSPHATE 4 MG/ML IJ SOLN
INTRAMUSCULAR | Status: DC | PRN
  Administered 2022-05-28: 5 mg via PERINEURAL

## 2022-05-28 MED ORDER — PHENYLEPHRINE HCL-NACL 20-0.9 MG/250ML-% IV SOLN
INTRAVENOUS | Status: DC | PRN
  Administered 2022-05-28: 20 ug/min via INTRAVENOUS

## 2022-05-28 MED ORDER — HYDROCODONE-ACETAMINOPHEN 5-325 MG PO TABS: 1.0000 | ORAL_TABLET | Freq: Four times a day (QID) | ORAL | 0 refills | Status: DC | PRN

## 2022-05-28 MED ORDER — ROPIVACAINE HCL 5 MG/ML IJ SOLN
INTRAMUSCULAR | Status: DC | PRN
  Administered 2022-05-28: 30 mL via PERINEURAL

## 2022-05-28 MED ORDER — PHENYLEPHRINE 80 MCG/ML (10ML) SYRINGE FOR IV PUSH (FOR BLOOD PRESSURE SUPPORT)
PREFILLED_SYRINGE | INTRAVENOUS | Status: AC
  Filled 2022-05-28: qty 20

## 2022-05-28 MED ORDER — EPHEDRINE SULFATE-NACL 50-0.9 MG/10ML-% IV SOSY
PREFILLED_SYRINGE | INTRAVENOUS | Status: DC | PRN
  Administered 2022-05-28 (×2): 5 mg via INTRAVENOUS

## 2022-05-28 SURGICAL SUPPLY — 63 items
BLADE AVERAGE 25X9 (BLADE) IMPLANT
BLADE OSC/SAG .038X5.5 CUT EDG (BLADE) ×1 IMPLANT
BLADE OSCILLATING/SAGITTAL (BLADE) ×1
BLADE SURG 15 STRL LF DISP TIS (BLADE) ×3 IMPLANT
BLADE SURG 15 STRL SS (BLADE) ×3
BLADE SW THK.38XMED LNG THN (BLADE) IMPLANT
BNDG CMPR 9X4 STRL LF SNTH (GAUZE/BANDAGES/DRESSINGS) ×1
BNDG ELASTIC 4X5.8 VLCR STR LF (GAUZE/BANDAGES/DRESSINGS) ×1 IMPLANT
BNDG ESMARK 4X9 LF (GAUZE/BANDAGES/DRESSINGS) ×1 IMPLANT
BNDG GAUZE DERMACEA FLUFF 4 (GAUZE/BANDAGES/DRESSINGS) ×1 IMPLANT
BNDG GAUZE ELAST 4 BULKY (GAUZE/BANDAGES/DRESSINGS) IMPLANT
BNDG GZE DERMACEA 4 6PLY (GAUZE/BANDAGES/DRESSINGS) ×1
BONE STAPLE KIT 18X18X18 (Staple) ×1 IMPLANT
BUR OVAL CARBIDE 4.0 (BURR) IMPLANT
COVER BACK TABLE 60X90IN (DRAPES) ×1 IMPLANT
COVER MAYO STAND STRL (DRAPES) IMPLANT
CUFF TOURN SGL QUICK 24 (TOURNIQUET CUFF)
CUFF TOURN SGL QUICK 34 (TOURNIQUET CUFF)
CUFF TRNQT CYL 24X4X16.5-23 (TOURNIQUET CUFF) IMPLANT
CUFF TRNQT CYL 34X4.125X (TOURNIQUET CUFF) IMPLANT
DRAPE EXTREMITY T 121X128X90 (DISPOSABLE) ×1 IMPLANT
DRAPE IMP U-DRAPE 54X76 (DRAPES) ×1 IMPLANT
DRAPE OEC MINIVIEW 54X84 (DRAPES) ×1 IMPLANT
DRAPE U-SHAPE 47X51 STRL (DRAPES) ×1 IMPLANT
DRSG ADAPTIC 3X8 NADH LF (GAUZE/BANDAGES/DRESSINGS) IMPLANT
DURAPREP 26ML APPLICATOR (WOUND CARE) ×1 IMPLANT
ELECT REM PT RETURN 9FT ADLT (ELECTROSURGICAL) ×1
ELECTRODE REM PT RTRN 9FT ADLT (ELECTROSURGICAL) ×1 IMPLANT
GAUZE 4X4 16PLY ~~LOC~~+RFID DBL (SPONGE) ×1 IMPLANT
GAUZE SPONGE 4X4 12PLY STRL (GAUZE/BANDAGES/DRESSINGS) ×1 IMPLANT
GAUZE XEROFORM 1X8 LF (GAUZE/BANDAGES/DRESSINGS) ×1 IMPLANT
GLOVE BIO SURGEON STRL SZ7 (GLOVE) ×1 IMPLANT
GLOVE BIOGEL PI IND STRL 7.5 (GLOVE) ×1 IMPLANT
GOWN STRL REUS W/TWL LRG LVL3 (GOWN DISPOSABLE) ×1 IMPLANT
K-WIRE DBL END TROCAR 6X.062 (WIRE) ×2
KIT INSTRUMENT DYNAFORCE PLATE (KITS) IMPLANT
KIT STAPLE BONE HIMAX 18X18X18 (Staple) IMPLANT
KIT TURNOVER CYSTO (KITS) ×1 IMPLANT
KWIRE DBL END TROCAR 6X.062 (WIRE) IMPLANT
NS IRRIG 1000ML POUR BTL (IV SOLUTION) IMPLANT
PACK BASIN DAY SURGERY FS (CUSTOM PROCEDURE TRAY) ×1 IMPLANT
PADDING CAST COTTON 6X4 STRL (CAST SUPPLIES) IMPLANT
PENCIL SMOKE EVACUATOR (MISCELLANEOUS) ×1 IMPLANT
PLATE MTP STANDARD 18 (Plate) ×1 IMPLANT
PLATE MTP STD 18 (Plate) IMPLANT
RASP SM TEAR CROSS CUT (RASP) IMPLANT
REAMER CUP/CONE DYNAFORCE 20 (MISCELLANEOUS) IMPLANT
SCREW LOCKING POLYAXIAL 3.5X16 (Screw) IMPLANT
SCREW POLYAXIL LOCK 3.5X12 (Screw) IMPLANT
SPONGE T-LAP 4X18 ~~LOC~~+RFID (SPONGE) IMPLANT
STOCKINETTE 6  STRL (DRAPES) ×1
STOCKINETTE 6 STRL (DRAPES) ×1 IMPLANT
SUCTION FRAZIER HANDLE 10FR (MISCELLANEOUS) ×1
SUCTION TUBE FRAZIER 10FR DISP (MISCELLANEOUS) ×1 IMPLANT
SUT MNCRL AB 3-0 PS2 18 (SUTURE) ×1 IMPLANT
SUT MNCRL AB 4-0 PS2 18 (SUTURE) ×1 IMPLANT
SUT MON AB 5-0 PS2 18 (SUTURE) ×1 IMPLANT
SUT PROLENE 3 0 PS 2 (SUTURE) IMPLANT
SUT PROLENE 4 0 PS 2 18 (SUTURE) IMPLANT
SYR BULB EAR ULCER 3OZ GRN STR (SYRINGE) ×1 IMPLANT
TOWEL OR 17X26 10 PK STRL BLUE (TOWEL DISPOSABLE) ×1 IMPLANT
TUBE CONNECTING 12X1/4 (SUCTIONS) ×1 IMPLANT
UNDERPAD 30X36 HEAVY ABSORB (UNDERPADS AND DIAPERS) ×1 IMPLANT

## 2022-05-28 NOTE — Progress Notes (Signed)
Assisted Dr. Lissa Hoard with right, popliteal/saphenous, ultrasound guided block. Side rails up, monitors on throughout procedure. See vital signs in flow sheet. Tolerated Procedure well.

## 2022-05-28 NOTE — Discharge Instructions (Addendum)
After Surgery Instructions   1) If you are recuperating from surgery anywhere other than home, please be sure to leave [name] the number where you can be reached.  2) Go directly home and rest.  3) Keep the operated foot(feet) elevated six inches above the hip when sitting or lying down. This will help control swelling and pain.  4) Support the elevated foot and leg with pillows. DO NOT PLACE PILLOWS UNDER THE KNEE.  5) DO NOT REMOVE or get your bandages WET, unless you were given different instructions by your doctor to do so. This increases the risk of infection.  6) Wear your surgical shoe or surgical boot at all times when you are up on your feet.  7) A limited amount of pain and swelling may occur. The skin may take on a bruised appearance. DO NOT BE ALARMED, THIS IS NORMAL.  8) For slight pain and swelling, apply an ice pack directly over the bandages for 15 minutes only out of each hour of the day. Continue until seen in the office for your first post op visit. DON NOT     APPLY ANY FORM OF HEAT TO THE AREA.  9) Have prescriptions filled immediately and take as directed.  10) Drink lots of liquids, water and juice to stay hydrated.  11) CALL IMMEDIATELY IF:  *Bleeding continues until the following day of surgery  *Pain increases and/or does not respond to medication  *Bandages or cast appears to tight  *If your bandage gets wet  *Trip, fall or stump your surgical foot  *If your temperature goes above 101  *If you have ANY questions at all  [place]. ADHERING TO THESE INSTRUCTIONS WILL OFFER YOU THE MOST COMPLETE RESULTS    No Weight Bearing on operative foot until directed otherwise.    Post Anesthesia Home Care Instructions  Activity: Get plenty of rest for the remainder of the day. A responsible individual must stay with you for 24 hours following the procedure.  For the next 24 hours, DO NOT: -Drive a car -Paediatric nurse -Drink  alcoholic beverages -Take any medication unless instructed by your physician -Make any legal decisions or sign important papers.  Meals: Start with liquid foods such as gelatin or soup. Progress to regular foods as tolerated. Avoid greasy, spicy, heavy foods. If nausea and/or vomiting occur, drink only clear liquids until the nausea and/or vomiting subsides. Call your physician if vomiting continues.  Special Instructions/Symptoms: Your throat may feel dry or sore from the anesthesia or the breathing tube placed in your throat during surgery. If this causes discomfort, gargle with warm salt water. The discomfort should disappear within 24 hours.   No acetaminophen/Tylenol until after 2:30 pm today if needed.      Regional Anesthesia Blocks  1. Numbness or the inability to move the "blocked" extremity may last from 3-48 hours after placement. The length of time depends on the medication injected and your individual response to the medication. If the numbness is not going away after 48 hours, call your surgeon.  2. The extremity that is blocked will need to be protected until the numbness is gone and the  Strength has returned. Because you cannot feel it, you will need to take extra care to avoid injury. Because it may be weak, you may have difficulty moving it or using it. You may not know what position it is in without looking at it while the block is in effect.  3. For blocks in the legs and feet, returning to weight bearing and walking needs to be done carefully. You will need to wait until the numbness is entirely gone and the strength has returned. You should be able to move your leg and foot normally before you try and bear weight or walk. You will need someone to be with you when you first try to ensure you do not fall and possibly risk injury.  4. Bruising and tenderness at the needle site are common side effects and will resolve in a few days.  5. Persistent numbness or new problems  with movement should be communicated to the surgeon.

## 2022-05-28 NOTE — Transfer of Care (Signed)
Immediate Anesthesia Transfer of Care Note  Patient: Meghan [name]  Procedure(s) Performed: HALLUX FUSION METATARSAL PHALANGEAL JOIT (Right: Foot)  Patient Location: PACU  Anesthesia Type:General and Regional  Level of Consciousness: awake, alert , oriented and patient cooperative  Airway & Oxygen Therapy: Patient Spontanous Breathing and Patient connected to face mask oxygen  Post-op Assessment: Report given to RN and Post -op Vital signs reviewed and stable  Post vital signs: Reviewed and stable  Last Vitals:  Vitals Value Taken Time  BP 134/91 05/28/22 1106  Temp 36.6 C 05/28/22 1106  Pulse 68 05/28/22 1110  Resp 13 05/28/22 1110  SpO2 100 % 05/28/22 1110  Vitals shown include unvalidated device data.  Last Pain:  Vitals:   05/28/22 0830  TempSrc: Oral  PainSc: 0-No pain      Patients Stated Pain Goal: 5 (02/16/55 1537)  Complications: No notable events documented.

## 2022-05-28 NOTE — Anesthesia Postprocedure Evaluation (Signed)
Anesthesia Post Note  Patient: Meghan [name]  Procedure(s) Performed: HALLUX FUSION METATARSAL PHALANGEAL JOIT (Right: Foot)     Patient location during evaluation: PACU Anesthesia Type: General Level of consciousness: sedated and patient cooperative Pain management: pain level controlled Vital Signs Assessment: post-procedure vital signs reviewed and stable Respiratory status: spontaneous breathing Cardiovascular status: stable Anesthetic complications: no   No notable events documented.  Last Vitals:  Vitals:   05/28/22 1200 05/28/22 1215  BP:  102/65  Pulse: 70 67  Resp: (!) 32 (!) 25  Temp:    SpO2: 93% 93%    Last Pain:  Vitals:   05/28/22 1215  TempSrc:   PainSc: 0-No pain                 Nolon Nations

## 2022-05-28 NOTE — Interval H&P Note (Signed)
History and Physical Interval Note:  05/28/2022 9:17 AM  Meghan [name]  has presented today for surgery, with the diagnosis of ARTHRITIS RIGHT FOOT.  The various methods of treatment have been discussed with the patient and family. After consideration of risks, benefits and other options for treatment, the patient has consented to  Procedure(s) with comments: [place] (Right) - BLOCK as a surgical intervention.  The patient's history has been reviewed, patient examined, no change in status, stable for surgery.  I have reviewed the patient's chart and labs.  Questions were answered to the patient's satisfaction.     [name]

## 2022-05-28 NOTE — Anesthesia Procedure Notes (Addendum)
Anesthesia Regional Block: Popliteal block   Pre-Anesthetic Checklist: , timeout performed,  Correct Patient, Correct Site, Correct Laterality,  Correct Procedure, Correct Position, site marked,  Risks and benefits discussed,  Surgical consent,  Pre-op evaluation,  At surgeon's request and post-op pain management  Laterality: Lower and Right  Prep: chloraprep       Needles:  Injection technique: Single-shot  Needle Type: Stimiplex     Needle Length: 10cm  Needle Gauge: 21     Additional Needles:   Procedures:,,,, ultrasound used (permanent image in chart),,   Motor weakness within 5 minutes.  Narrative:  Start time: 05/28/2022 9:00 AM End time: 05/28/2022 9:30 AM Injection made incrementally with aspirations every 5 mL.  Performed by: Personally  Anesthesiologist: Nolon Nations, MD  Additional Notes: Extremely challenging block due to morbid obesity and large leg girth. Nerve located with significant difficulty and needle positioned with direct ultrasound guidance. Good perineural spread. Patient tolerated well.

## 2022-05-28 NOTE — Anesthesia Procedure Notes (Signed)
Procedure Name: Intubation Date/Time: 05/28/2022 9:47 AM  Performed by: Rogers Blocker, CRNAPre-anesthesia Checklist: Patient identified, Emergency Drugs available, Suction available and Patient being monitored Patient Re-evaluated:Patient Re-evaluated prior to induction Oxygen Delivery Method: Circle System Utilized Preoxygenation: Pre-oxygenation with 100% oxygen Induction Type: IV induction Ventilation: Mask ventilation without difficulty Laryngoscope Size: Mac and 3 Grade View: Grade I Tube type: Oral Tube size: 7.0 mm Number of attempts: 1 Airway Equipment and Method: Stylet and Bite block Placement Confirmation: ETT inserted through vocal cords under direct vision, positive ETCO2 and breath sounds checked- equal and bilateral Secured at: 22 cm Tube secured with: Tape Dental Injury: Teeth and Oropharynx as per pre-operative assessment

## 2022-05-28 NOTE — Op Note (Signed)
Surgeon: Surgeon(s): [name], DPM  Assistants: None Pre-operative diagnosis: ARTHRITIS RIGHT FOOT  Post-operative diagnosis: same Procedure: Procedure(s) (LRB): HALLUX FUSION METATARSAL PHALANGEAL JOIT (Right)  Pathology: * No specimens in log *  Pertinent Intra-op findings: Arthritis noted at the first metatarsophalangeal joint with bunion deformity Anesthesia: General  Hemostasis:  Total Tourniquet Time Documented: Calf (Right) - 48 minutes Total: Calf (Right) - 48 minutes  EBL: Minimal less than 50 cc Materials: Crossroads plates and screw x 4 and crossroads staple x 1 Injectables: None Complications: None  Indications for surgery: A 56 y.o. 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 Patient has failed all conservative therapy including but not limited to shoe gear modification injection offloading. She wishes to have surgical correction of the foot/deformity. It was determined that patient would benefit from right first metatarsophalangeal joint arthrodesis with fixation. Informed surgical risk consent was reviewed and read aloud to the patient.  I reviewed the films.  I have discussed my findings with the patient in great detail.  I have discussed all risks including but not limited to infection, stiffness, scarring, limp, disability, deformity, damage to blood vessels and nerves, numbness, poor healing, need for braces, arthritis, chronic pain, amputation, death.  All benefits and realistic expectations discussed in great detail.  I have made no promises as to the outcome.  I have provided realistic expectations.  I have offered the patient a 2nd opinion, which they have declined and assured me they preferred to proceed despite the risks   Procedure in detail: The patient was both verbally and visually identified by myself, the nursing staff, and anesthesia staff in the preoperative holding area. They were then transferred to the operating  room and placed on the operative table in supine position.   Attention was directed to the dorsal aspect of the Right first metatarsophalangeal joint where a linear skin incision approximately 6cm long was made in the skin using a #15 blade. This incision was carried down through the subcutaneous tissue taking care to clamp and cauterize all neurovascular structures as necessary.  The capsule of 1st MPJ was identified. A linear capsulotomy was then performed in-line with the original skin incision and the capsule was reflected both medially and laterally to expose the head of the first metatarsal and the base of the proximal phalanx. The articular surface of the 1st metatarsal head was noted to show evidence of '[]'$  degeneration under direct visualization. The sagittal saw was then used to resect the dorsal, medial, and lateral prominences off of the 1st metatarsal. A rongeur was utilized to remove the dorsal exostosis of the proximal phalanx. A curette was used to remove some of the cartilage, and the remaining cartilage was removed using a burr until punctate bleeding could be noted on the metatarsal head and base of proximal phalanx. The site as flushed with copious amounts of sterile saline.  The distal aspect of the 1st metatarsal and the base of the proximal phalanx were then subchondrally drilled utilizing a pineapple burr. A Temporary K wire was then placed from distal-medial to proximal-lateral across the 1st MPJ, and the position was confirmed to be satisfactory utilizing fluoroscopy.. A dorsal right1st MPJ fusion locking plate was then applied and secured with two olive wires.  A crossroads staple was utilized through the dorsal plate to hold the fixation.The position was confirmed to be satisfactory with fluoroscopy, and two screws were placed both proximally and distally for a total of four Nonlocking/locking screws utilized.  The 1st MPJ was then stressed intraoperatively and no motion or gapping was  noted across the fusion site. Final imaging via fluoroscopy was taken to confirm adequate placement and rectus position of the hallux. The surgical site was copiously irrigated with sterile saline. The capsule was then reapproximated with 3-0 Monocortical in a simple interrupted fashion. The subcutaneous tissue was then reapproximated with 4-0 monocryl in a running fashion.  Skin was closed with 3-0 Prolene.  At the conclusion of the procedure the patient was awoken from anesthesia and found to have tolerated the procedure well any complications. There were transferred to PACU with vital signs stable and vascular status intact.  Boneta Lucks, DPM

## 2022-05-29 ENCOUNTER — Ambulatory Visit (HOSPITAL_COMMUNITY): Payer: Medicare Other | Admitting: Licensed Clinical Social Worker

## 2022-05-29 ENCOUNTER — Encounter (HOSPITAL_COMMUNITY): Payer: Self-pay

## 2022-05-30 ENCOUNTER — Encounter (HOSPITAL_BASED_OUTPATIENT_CLINIC_OR_DEPARTMENT_OTHER): Payer: Self-pay | Admitting: Podiatry

## 2022-06-05 ENCOUNTER — Ambulatory Visit (INDEPENDENT_AMBULATORY_CARE_PROVIDER_SITE_OTHER): Payer: Medicare Other | Admitting: Licensed Clinical Social Worker

## 2022-06-05 DIAGNOSIS — F4310 Post-traumatic stress disorder, unspecified: Secondary | ICD-10-CM | POA: Diagnosis not present

## 2022-06-05 DIAGNOSIS — F331 Major depressive disorder, recurrent, moderate: Secondary | ICD-10-CM | POA: Diagnosis not present

## 2022-06-05 DIAGNOSIS — F411 Generalized anxiety disorder: Secondary | ICD-10-CM | POA: Diagnosis not present

## 2022-06-05 NOTE — Progress Notes (Signed)
Virtual Visit via Video Note   I connected with Bethania S. Rayo on 06/05/22 at 2:00pm by video enabled telemedicine application and verified that I am speaking with the correct person using two identifiers.   I discussed the limitations, risks, security and privacy concerns of performing an evaluation and management service by video and the availability of in person appointments. I also discussed with the patient that there may be a patient responsible charge related to this service. The patient expressed understanding and agreed to proceed.   I discussed the assessment and treatment plan with the patient. The patient was provided an opportunity to ask questions and all were answered. The patient agreed with the plan and demonstrated an understanding of the instructions.   The patient was advised to call back or seek an in-person evaluation if the symptoms worsen or if the condition fails to improve as anticipated.   I provided 40 minutes of non-face-to-face time during this encounter.     [name], LCSW, LCAS _______________________ THERAPIST PROGRESS NOTE   Session Time:  2:00pm - 2:40pm    Location: Patient: Patient Home Provider: [facility]    Participation Level: Active    Behavioral Response: Alert, casually dressed, euthymic mood/affect   Type of Therapy:  Individual Therapy   Treatment Goals addressed: Mood management; Medication management  Progress Towards Goals: Progressing   Interventions: CBT: guided meditation    Summary: Kestrel S. Mcniel is a 56 year old divorced African American female that presented for therapy appointment today with diagnoses of Major Depressive Disorder, recurrent, moderate, Generalized Anxiety Disorder and PTSD.      Suicidal/Homicidal: None; without plan or intent                                                                                                                Therapist Response:  Clinician met with Analis for virtual therapy  appointment and assessed for safety, sobriety, and medication compliance.  Jenny presented for session on time and was alert, oriented x5, with no evidence or self-report of active SI/HI or A/V H.  Sakia reported ongoing compliance with medication and denied any use of alcohol or illicit substances.  Clinician inquired about Kamorie's current emotional ratings, as well as any significant changes in thoughts, feelings, or behavior since previous check-in.  Kyrstin reported scores of 0/10 for depression, 0/10 for anxiety, and 0/10 for anger/irritability.  Pascale denied any recent panic attacks or outbursts.  She reported that a recent success was having a successful surgery on last Monday, stating "Everything went fine, I'm feeling good. I'm in no pain and didn't need the Vicodin they prescribed me.  I do have to be off my foot and use crutches for 3.5 weeks".  Manilla reported that on Sunday she also spoke with her pastor about the issue they had in a past sermon, stating "We went to his office, and I told him exactly about how I felt.  He apologized, and said he didn't mean to make it seem like  he was picking on me.  We are good now".  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  Clinician invited Felicha to practice a new meditation exercise to add to self-care routine in order to better manage future stress and tension during recover process.  Clinician provided instruction on process of getting comfortable, achieving relaxing breathing rhythm, and visualizing a protective light moving across the muscle groups within her body to identify and eliminate areas of tension associated with stress over course of 15 minutes practice.  Clinician inquired about how Rissie mentally and physically felt afterward, any challenges she faced in concentration, and whether she would be motivated to practice this in free time to improve coping ability.  Intervention was effective, as  evidenced by [name] actively engaging in exercise, and reporting that she felt more relaxed afterward mentally and physically and would plan to add to her routine, stating "It was like I was somewhere else. I could see and feel the light.  It was bright and warm, but not overwhelming. I do feel better".  Clinician will continue to monitor.        Plan: Follow up again in 1 week.    Diagnosis: Major depressive disorder, recurrent, moderate; Generalized Anxiety Disorder; and PTSD   Collaboration of Care:   No collaboration of care required for this visit.                                                   Patient/Guardian was advised Release of Information must be obtained prior to any record release in order to collaborate their care with an outside provider. Patient/Guardian was advised if they have not already done so to contact the registration department to sign all necessary forms in order for [name] to release information regarding their care.    Consent: Patient/Guardian gives verbal consent for treatment and assignment of benefits for services provided during this visit. Patient/Guardian expressed understanding and agreed to proceed.  [name], LCSW, LCAS 06/05/22

## 2022-06-06 ENCOUNTER — Ambulatory Visit (INDEPENDENT_AMBULATORY_CARE_PROVIDER_SITE_OTHER): Payer: Medicare Other

## 2022-06-06 ENCOUNTER — Ambulatory Visit (INDEPENDENT_AMBULATORY_CARE_PROVIDER_SITE_OTHER): Payer: Medicaid Other | Admitting: Podiatry

## 2022-06-06 DIAGNOSIS — Z9889 Other specified postprocedural states: Secondary | ICD-10-CM

## 2022-06-06 DIAGNOSIS — M21611 Bunion of right foot: Secondary | ICD-10-CM

## 2022-06-06 DIAGNOSIS — M19071 Primary osteoarthritis, right ankle and foot: Secondary | ICD-10-CM

## 2022-06-06 NOTE — Progress Notes (Signed)
Subjective:  Patient ID: Meghan [name], female    DOB: 1965-09-19,  MRN: [id number]  Chief Complaint  Patient presents with   Routine Post Op    POV #1 DOS 05/28/2022 RT 1ST MPJ FUSION    DOS: 05/28/2022 Procedure: Right partial metatarsophalangeal joint fusion  56 y.o. female returns for post-op check.  She states she is doing well no pain nonweightbearing to the right lower extremity with crutches bandages clean dry and intact.  Review of Systems: Negative except as noted in the HPI. Denies N/V/F/Ch.  Past Medical History:  Diagnosis Date   Anxiety    Arthritis    left knee   Asthma    Atrial fibrillation ([place]) 02/16/2019   CHF (congestive heart failure) ([place]) 01/2019   Physician from [place] informed her she had this    Diabetes mellitus without complication ([place])    Dysrhythmia    Atrial fibrillation   GERD (gastroesophageal reflux disease)    Hiatal hernia    History of pulmonary embolus (PE)    Hypercholesterolemia    Macular degeneration    of left eye   MDD (major depressive disorder)    Osteoarthritis    knees   Pneumonia    in the past   PTSD (post-traumatic stress disorder)    Pulmonary embolism ([place])    bilateral, none since 2003   Seizure disorder ([place])    Per patient last seizure was in 2020.   Sleep apnea CPAP   [facility]  Uses CPAP   Stroke ([place])    1989 and 1995 (left sided weakness)   Vitamin D deficiency     Current Outpatient Medications:    acetaminophen (TYLENOL) 500 MG tablet, Take 1,000 mg by mouth every 6 (six) hours as needed for moderate pain., Disp: , Rfl:    albuterol (VENTOLIN HFA) 108 (90 Base) MCG/ACT inhaler, Inhale 2 puffs into the lungs every 6 (six) hours as needed for wheezing or shortness of breath., Disp: , Rfl:    atorvastatin (LIPITOR) 40 MG tablet, Take 40 mg by mouth every evening., Disp: , Rfl:    BREZTRI AEROSPHERE 160-9-4.8 MCG/ACT AERO, Inhale 2 puffs into the lungs in the morning and at bedtime. , Disp: , Rfl:     bumetanide (BUMEX) 1 MG tablet, Take 1 mg by mouth daily., Disp: , Rfl:    Cholecalciferol (VITAMIN D-3) 25 MCG (1000 UT) CAPS, Take 1,000 Units by mouth daily., Disp: , Rfl:    flecainide (TAMBOCOR) 50 MG tablet, Take 50 mg by mouth 2 (two) times daily. , Disp: , Rfl:    HYDROcodone-acetaminophen (NORCO) 5-325 MG tablet, Take 1 tablet by mouth every 6 (six) hours as needed for moderate pain., Disp: 30 tablet, Rfl: 0   hydrOXYzine (ATARAX) 25 MG tablet, TAKE 1-2 TABLETS AT BEDTIME FOR INSOMNIA, Disp: 60 tablet, Rfl: 0   levETIRAcetam (KEPPRA) 500 MG tablet, Take 1 tablet (500 mg total) by mouth 2 (two) times daily., Disp: 180 tablet, Rfl: 3   metFORMIN (GLUCOPHAGE) 500 MG tablet, Take 500 mg by mouth daily with breakfast., Disp: , Rfl:    metoCLOPramide (REGLAN) 10 MG tablet, Take 1 tablet (10 mg total) by mouth every 6 (six) hours as needed for nausea (nausea/headache)., Disp: 10 tablet, Rfl: 0   metoprolol succinate (TOPROL-XL) 25 MG 24 hr tablet, Take 25 mg by mouth daily., Disp: , Rfl:    Omega-3 Fatty Acids (FISH OIL) 1000 MG CAPS, Take 1,000 mg by mouth daily with breakfast. , Disp: ,  Rfl:    omeprazole (PRILOSEC) 40 MG capsule, Take 40 mg by mouth daily., Disp: , Rfl:    ondansetron (ZOFRAN-ODT) 4 MG disintegrating tablet, Take 1 tablet (4 mg total) by mouth every 8 (eight) hours as needed for nausea or vomiting., Disp: 20 tablet, Rfl: 0   rivaroxaban (XARELTO) 20 MG TABS tablet, Take 20 mg by mouth at bedtime. , Disp: , Rfl:    RYBELSUS 14 MG TABS, Take 14 mg by mouth daily., Disp: , Rfl:    sertraline (ZOLOFT) 50 MG tablet, Take 50 mg by mouth daily., Disp: , Rfl:   Social History   Tobacco Use  Smoking Status Never  Smokeless Tobacco Never    Allergies  Allergen Reactions   Acetaminophen-Codeine Swelling, Rash and Other (See Comments)    Tylenol with Codeine, Tylenol #3,  (facial swelling, hives)   Bee Venom Anaphylaxis   Meloxicam Anaphylaxis and Rash   Tramadol Hives and  Other (See Comments)    Patient stated "I was trippin' and I do not want that ever again"   Codeine Swelling    hives   Coconut (Cocos Nucifera) Rash and Other (See Comments)    ANY coconut products    Tomato Rash    Fresh    Objective:  There were no vitals filed for this visit. There is no height or weight on file to calculate BMI. Constitutional Well developed. Well nourished.  Vascular Foot warm and well perfused. Capillary refill normal to all digits.   Neurologic Normal speech. Oriented to person, place, and time. Epicritic sensation to light touch grossly present bilaterally.  Dermatologic Skin healing well without signs of infection. Skin edges well coapted without signs of infection.  Orthopedic: Tenderness to palpation noted about the surgical site.   Radiographs: 3 views of skeletally mature the right foot: Hardware is intact no signs of backing or loosening noted.  Osseous bridging/consolidated noted at the first metatarsophalangeal joint good position alignment noted Assessment:   1. Status post surgery   2. Bunion, right foot    Plan:  Patient was evaluated and treated and all questions answered.  S/p foot surgery right -Progressing as expected post-operatively. -XR: See above -WB Status: Weightbearing in with crutches to the right lower extremity -Sutures: Intact.  No medical signs of dehiscence noted.  No complication noted. -Medications: None -Foot redressed.  No follow-ups on file.

## 2022-06-12 ENCOUNTER — Telehealth: Payer: Self-pay | Admitting: Podiatry

## 2022-06-12 ENCOUNTER — Ambulatory Visit (INDEPENDENT_AMBULATORY_CARE_PROVIDER_SITE_OTHER): Payer: Medicare Other | Admitting: Licensed Clinical Social Worker

## 2022-06-12 DIAGNOSIS — F331 Major depressive disorder, recurrent, moderate: Secondary | ICD-10-CM | POA: Diagnosis not present

## 2022-06-12 DIAGNOSIS — F411 Generalized anxiety disorder: Secondary | ICD-10-CM | POA: Diagnosis not present

## 2022-06-12 DIAGNOSIS — F4310 Post-traumatic stress disorder, unspecified: Secondary | ICD-10-CM

## 2022-06-12 NOTE — Progress Notes (Signed)
Virtual Visit via Video Note   I connected with Meghan [name] on 06/12/22 at 2:00pm by video enabled telemedicine application and verified that I am speaking with the correct person using two identifiers.   I discussed the limitations, risks, security and privacy concerns of performing an evaluation and management service by video and the availability of in person appointments. I also discussed with the patient that there may be a patient responsible charge related to this service. The patient expressed understanding and agreed to proceed.   I discussed the assessment and treatment plan with the patient. The patient was provided an opportunity to ask questions and all were answered. The patient agreed with the plan and demonstrated an understanding of the instructions.   The patient was advised to call back or seek an in-person evaluation if the symptoms worsen or if the condition fails to improve as anticipated.   I provided 40 minutes of non-face-to-face time during this encounter.     [name], LCSW, LCAS _______________________ THERAPIST PROGRESS NOTE   Session Time:  2:00pm - 2:40pm     Location: Patient: Patient Home Provider: [facility] [place] [facility]    Participation Level: Active    Behavioral Response: Alert, casually dressed, angry/irritable mood/affect     Type of Therapy:  Individual Therapy   Treatment Goals addressed: Mood management; Medication management    Progress Towards Goals: Progressing     Interventions: CBT: guided meditation/body scan; pain assessment     Summary: Meghan [name] is a 56 year old divorced African American female that presented for therapy appointment today with diagnoses of Major Depressive Disorder, recurrent, moderate, Generalized Anxiety Disorder and PTSD.      Suicidal/Homicidal: None; without plan or intent                                                                                                                Therapist Response:   Clinician met with Meghan [name] for virtual therapy session and assessed for safety, sobriety, and medication compliance.  Meghan [name] presented for appointment on time and was alert, oriented x5, with no evidence or self-report of active SI/HI or A/V H.  Meghan [name] reported ongoing compliance with medication and denied any use of alcohol or illicit substances.  Clinician inquired about Meghan [name]'s emotional ratings today, as well as any significant changes in thoughts, feelings, or behavior since last check-in.  Meghan [name] reported scores of 0/10 for depression, 0/10 for anxiety, and 4/10 for anger/irritability.  Meghan [name] denied any recent panic attacks or outbursts.  She reported that a recent struggle has been dealing with increased pain following a fall she took yesterday, stating "The ground was wet, and I was on crutches, and one slipped and down I went".  Meghan [name] reported that she is scheduled to visit her doctor tomorrow to be assessed, but the pain has been severe.  Clinician completed a pain screening with Meghan [name] (See chart) in order to assess pain level, which she rated at 10/10 in her right foot.  Meghan [name] reported that her  doctor prescribed some pain medication and offered guidance on how to address discomfort, but it has had little effect.  Clinician offered to provide a guided meditation exercise for pain relief to assist with this issue today.  Meghan [name] was receptive to this, so clinician guided her through process of getting comfortable, achieving relaxing breathing pattern, and then systematically scanned the different regions of the body in order to identify areas of discomfort, and practiced mindfulness to increase acceptance and overall coping ability.  Intervention effectiveness was mixed, as Meghan [name] reported that there were moments during exercise where she noticed some relief from pain, but she also felt like screaming out loud due to intensity of pain coming in 'waves' at times.  Clinician encouraged Meghan [name] to consider visiting  urgent care or ER today given severity of pain and lack of effectiveness from intervention.  Meghan [name] was agreeable to this, and reported that she would first call her sister for transportation, but if she could not reach her, she would call 911.  Clinician will continue to monitor.        Plan: Follow up again in 1 week.    Diagnosis: Major depressive disorder, recurrent, moderate; Generalized Anxiety Disorder; and PTSD   Collaboration of Care:   No collaboration of care required for this visit.                                                   Patient/Guardian was advised Release of Information must be obtained prior to any record release in order to collaborate their care with an outside provider. Patient/Guardian was advised if they have not already done so to contact the registration department to sign all necessary forms in order for us to release information regarding their care.    Consent: Patient/Guardian gives verbal consent for treatment and assignment of benefits for services provided during this visit. Patient/Guardian expressed understanding and agreed to proceed.  Cory  Bates, LCSW, LCAS 06/12/22 

## 2022-06-12 NOTE — Telephone Encounter (Signed)
Spoke with patient, said that have not noticed any swelling or bruising but is having shooting pain and suggested to continue to ice, elevate, try to stay off that foot until her appointment on 09/27,verbalized understanding and said ok.

## 2022-06-12 NOTE — Telephone Encounter (Signed)
Patient fell on surgery site having extreme pain and swelling.  Would like to speak with someone .  Patient only wants to see Dr Posey Pronto I have her scheduled for 06/13/22

## 2022-06-13 ENCOUNTER — Ambulatory Visit (INDEPENDENT_AMBULATORY_CARE_PROVIDER_SITE_OTHER): Payer: Medicare Other

## 2022-06-13 ENCOUNTER — Ambulatory Visit (INDEPENDENT_AMBULATORY_CARE_PROVIDER_SITE_OTHER): Payer: Medicare Other | Admitting: Podiatry

## 2022-06-13 DIAGNOSIS — M21611 Bunion of right foot: Secondary | ICD-10-CM

## 2022-06-13 DIAGNOSIS — Z9889 Other specified postprocedural states: Secondary | ICD-10-CM

## 2022-06-13 DIAGNOSIS — M19071 Primary osteoarthritis, right ankle and foot: Secondary | ICD-10-CM

## 2022-06-13 MED ORDER — HYDROCODONE-ACETAMINOPHEN 5-325 MG PO TABS: 1.0000 | ORAL_TABLET | Freq: Four times a day (QID) | ORAL | 0 refills | Status: DC | PRN

## 2022-06-13 NOTE — Progress Notes (Signed)
Subjective:  Patient ID: Meghan [name], female    DOB: July 26, 1966,  MRN: [id number]  Chief Complaint  Patient presents with   Post-op Problem    Pt stated that she fell on her post op foot is in a lot of pain    DOS: 05/28/2022 Procedure: Right partial metatarsophalangeal joint fusion  56 y.o. female returns for post-op check.  Patient states she fell and hit her foot and has been causing her a lot of pain.  She wanted to get it eval make sure she did not break anything.  She states she fell out of the crutches.  Did not denies any other acute complaints she was doing good prior to the fall injury  Review of Systems: Negative except as noted in the HPI. Denies N/V/F/Ch.  Past Medical History:  Diagnosis Date   Anxiety    Arthritis    left knee   Asthma    Atrial fibrillation ([place]) 02/16/2019   CHF (congestive heart failure) ([place]) 01/2019   [place]ician from [place] informed her she had this    Diabetes mellitus without complication ([place])    Dysrhythmia    Atrial fibrillation   GERD (gastroesophageal reflux disease)    Hiatal hernia    History of pulmonary embolus (PE)    Hypercholesterolemia    Macular degeneration    of left eye   MDD (major depressive disorder)    Osteoarthritis    knees   Pneumonia    in the past   PTSD (post-traumatic stress disorder)    Pulmonary embolism ([place])    bilateral, none since 2003   Seizure disorder ([place])    Per patient last seizure was in 2020.   Sleep apnea CPAP   [facility]  Uses CPAP   Stroke ([place])    1989 and 1995 (left sided weakness)   Vitamin D deficiency     Current Outpatient Medications:    HYDROcodone-acetaminophen (NORCO) 5-325 MG tablet, Take 1 tablet by mouth every 6 (six) hours as needed for moderate pain., Disp: 30 tablet, Rfl: 0   acetaminophen (TYLENOL) 500 MG tablet, Take 1,000 mg by mouth every 6 (six) hours as needed for moderate pain., Disp: , Rfl:    albuterol (VENTOLIN HFA) 108 (90 Base) MCG/ACT inhaler, Inhale 2  puffs into the lungs every 6 (six) hours as needed for wheezing or shortness of breath., Disp: , Rfl:    atorvastatin (LIPITOR) 40 MG tablet, Take 40 mg by mouth every evening., Disp: , Rfl:    BREZTRI AEROSPHERE 160-9-4.8 MCG/ACT AERO, Inhale 2 puffs into the lungs in the morning and at bedtime. , Disp: , Rfl:    bumetanide (BUMEX) 1 MG tablet, Take 1 mg by mouth daily., Disp: , Rfl:    Cholecalciferol (VITAMIN D-3) 25 MCG (1000 UT) CAPS, Take 1,000 Units by mouth daily., Disp: , Rfl:    flecainide (TAMBOCOR) 50 MG tablet, Take 50 mg by mouth 2 (two) times daily. , Disp: , Rfl:    HYDROcodone-acetaminophen (NORCO) 5-325 MG tablet, Take 1 tablet by mouth every 6 (six) hours as needed for moderate pain., Disp: 30 tablet, Rfl: 0   hydrOXYzine (ATARAX) 25 MG tablet, TAKE 1-2 TABLETS AT BEDTIME FOR INSOMNIA, Disp: 60 tablet, Rfl: 0   levETIRAcetam (KEPPRA) 500 MG tablet, Take 1 tablet (500 mg total) by mouth 2 (two) times daily., Disp: 180 tablet, Rfl: 3   metFORMIN (GLUCOPHAGE) 500 MG tablet, Take 500 mg by mouth daily with breakfast., Disp: , Rfl:  metoCLOPramide (REGLAN) 10 MG tablet, Take 1 tablet (10 mg total) by mouth every 6 (six) hours as needed for nausea (nausea/headache)., Disp: 10 tablet, Rfl: 0   metoprolol succinate (TOPROL-XL) 25 MG 24 hr tablet, Take 25 mg by mouth daily., Disp: , Rfl:    Omega-3 Fatty Acids (FISH OIL) 1000 MG CAPS, Take 1,000 mg by mouth daily with breakfast. , Disp: , Rfl:    omeprazole (PRILOSEC) 40 MG capsule, Take 40 mg by mouth daily., Disp: , Rfl:    ondansetron (ZOFRAN-ODT) 4 MG disintegrating tablet, Take 1 tablet (4 mg total) by mouth every 8 (eight) hours as needed for nausea or vomiting., Disp: 20 tablet, Rfl: 0   rivaroxaban (XARELTO) 20 MG TABS tablet, Take 20 mg by mouth at bedtime. , Disp: , Rfl:    RYBELSUS 14 MG TABS, Take 14 mg by mouth daily., Disp: , Rfl:    sertraline (ZOLOFT) 50 MG tablet, Take 50 mg by mouth daily., Disp: , Rfl:   Social  History   Tobacco Use  Smoking Status Never  Smokeless Tobacco Never    Allergies  Allergen Reactions   Acetaminophen-Codeine Swelling, Rash and Other (See Comments)    Tylenol with Codeine, Tylenol #3,  (facial swelling, hives)   Bee Venom Anaphylaxis   Meloxicam Anaphylaxis and Rash   Tramadol Hives and Other (See Comments)    Patient stated "I was trippin' and I do not want that ever again"   Codeine Swelling    hives   Coconut (Cocos Nucifera) Rash and Other (See Comments)    ANY coconut products    Tomato Rash    Fresh    Objective:  There were no vitals filed for this visit. There is no height or weight on file to calculate BMI. Constitutional Well developed. Well nourished.  Vascular Foot warm and well perfused. Capillary refill normal to all digits.   Neurologic Normal speech. Oriented to person, place, and time. Epicritic sensation to light touch grossly present bilaterally.  Dermatologic Skin healing well without signs of infection. Skin edges well coapted without signs of infection.  Orthopedic: Tenderness to palpation noted about the surgical site.   Radiographs: 3 views of skeletally mature the right foot: Hardware is intact no signs of backing or loosening noted.  Osseous bridging/consolidated noted at the first metatarsophalangeal joint good position alignment noted.  No signs of fractures or breakdown of hardware noted Assessment:   1. Status post surgery   2. Bunion, right foot   3. Arthritis of first metatarsophalangeal (MTP) joint of right foot    Plan:  Patient was evaluated and treated and all questions answered.  S/p foot surgery right -Progressing as expected post-operatively. -XR: See above -WB Status: Patient can slowly weightbearing as tolerated with Cam boot and right lower extremity -Sutures: Intact.  No medical signs of dehiscence noted.  No complication noted.  We will move it during next visit -Medications: None -Foot redressed.  No  follow-ups on file.

## 2022-06-14 ENCOUNTER — Ambulatory Visit (HOSPITAL_BASED_OUTPATIENT_CLINIC_OR_DEPARTMENT_OTHER): Payer: Medicare Other | Attending: Primary Care | Admitting: Pulmonary Disease

## 2022-06-14 DIAGNOSIS — G4733 Obstructive sleep apnea (adult) (pediatric): Secondary | ICD-10-CM | POA: Diagnosis not present

## 2022-06-15 DIAGNOSIS — G4733 Obstructive sleep apnea (adult) (pediatric): Secondary | ICD-10-CM

## 2022-06-15 NOTE — Procedures (Signed)
Patient Name: Meghan [name], Meghan [name] Date: 06/14/2022 Gender: Female D.O.B: [birth year] Age (years): 63 Referring Provider: Geraldo Pitter NP Height (inches): 66 Interpreting Physician: Kara Mead MD, ABSM Weight (lbs): 288 RPSGT: Jorge Ny BMI: 46 MRN: [id number] Neck Size: 15.75 <br> <br> CLINICAL INFORMATION The patient is referred for a CPAP titration to treat sleep apnea.    NPSG 05/16/2019 showed severe obstructive sleep apnea, overall AHI was 32.1 an hour with desaturations to 62%.    SLEEP STUDY TECHNIQUE As per the AASM Manual for the Scoring of Sleep and Associated Events v2.3 (April 2016) with a hypopnea requiring 4% desaturations.  The channels recorded and monitored were frontal, central and occipital EEG, electrooculogram (EOG), submentalis EMG (chin), nasal and oral airflow, thoracic and abdominal wall motion, anterior tibialis EMG, snore microphone, electrocardiogram, and pulse oximetry. Continuous positive airway pressure (CPAP) was initiated at the beginning of the study and titrated to treat sleep-disordered breathing.  MEDICATIONS Medications self-administered by patient taken the night of the study : ATORVASTATIN, FLECAINIDE ACETATE, HYDROCODONE W/ACETAMINOPHEN, Xarelto  TECHNICIAN COMMENTS Comments added by technician: none Comments added by scorer: N/A RESPIRATORY PARAMETERS Optimal PAP Pressure (cm): 11 AHI at Optimal Pressure (/hr): 2.7 Overall Minimal O2 (%): 85.0 Supine % at Optimal Pressure (%): 0 Minimal O2 at Optimal Pressure (%): 85.0   SLEEP ARCHITECTURE The study was initiated at 10:03:15 PM and ended at 5:03:32 AM.  Sleep onset time was 21.4 minutes and the sleep efficiency was 90.1%%. The total sleep time was 378.5 minutes.  The patient spent 0.8%% of the night in stage N1 sleep, 69.2%% in stage N2 sleep, 0.0%% in stage N3 and 30% in REM.Stage REM latency was 48.5 minutes  Wake after sleep onset was 20.4. Alpha intrusion was absent.  Supine sleep was 37.37%.  CARDIAC DATA The 2 lead EKG demonstrated atrial fibrillation. The mean heart rate was 86.6 beats per minute. Other EKG findings include: None.   LEG MOVEMENT DATA The total Periodic Limb Movements of Sleep (PLMS) were 0. The PLMS index was 0.0. A PLMS index of <15 is considered normal in adults.  IMPRESSIONS - An optimal PAP pressure of 11 cm was selected for this patient based on the available study data. - Moderate oxygen desaturations were observed during this titration (min O2 = 85.0%). - The patient snored with soft snoring volume during this titration study. - No cardiac abnormalities were observed during this study. - Clinically significant periodic limb movements were not noted during this study. Arousals associated with PLMs were rare.   DIAGNOSIS - Obstructive Sleep Apnea (G47.33)   RECOMMENDATIONS - Trial of CPAP 11 cm with medium airfit N20 full face mask - Avoid alcohol, sedatives and other CNS depressants that may worsen sleep apnea and disrupt normal sleep architecture. - Sleep hygiene should be reviewed to assess factors that may improve sleep quality. - Weight management and regular exercise should be initiated or continued. - Return to [facility] for re-evaluation after 4 weeks of therapy    Kara Mead MD Board Certified in [place]

## 2022-06-20 ENCOUNTER — Ambulatory Visit (INDEPENDENT_AMBULATORY_CARE_PROVIDER_SITE_OTHER): Payer: Medicaid Other | Admitting: Podiatry

## 2022-06-20 DIAGNOSIS — E1142 Type 2 diabetes mellitus with diabetic polyneuropathy: Secondary | ICD-10-CM

## 2022-06-20 DIAGNOSIS — M21611 Bunion of right foot: Secondary | ICD-10-CM

## 2022-06-20 DIAGNOSIS — M19071 Primary osteoarthritis, right ankle and foot: Secondary | ICD-10-CM

## 2022-06-20 DIAGNOSIS — Z9889 Other specified postprocedural states: Secondary | ICD-10-CM

## 2022-06-20 MED ORDER — HYDROCODONE-ACETAMINOPHEN 5-325 MG PO TABS: 1.0000 | ORAL_TABLET | Freq: Four times a day (QID) | ORAL | 0 refills | Status: DC | PRN

## 2022-06-20 NOTE — Addendum Note (Signed)
Addended by: Boneta Lucks on: 06/20/2022 02:15 PM   Modules accepted: Orders

## 2022-06-20 NOTE — Progress Notes (Signed)
Subjective:  Patient ID: Meghan [name], female    DOB: 05-Jan-1966,  MRN: [id number]  Chief Complaint  Patient presents with   Routine Post Op    POV #2 DOS 05/28/2022 RT 1ST MPJ FUSION    DOS: 05/28/2022 Procedure: Right partial metatarsophalangeal joint fusion  56 y.o. female returns for post-op check.  Patient states she fell and hit her foot and has been causing her a lot of pain.  She wanted to get it eval make sure she did not break anything.  She states she fell out of the crutches.  Did not denies any other acute complaints she was doing good prior to the fall injury  Review of Systems: Negative except as noted in the HPI. Denies N/V/F/Ch.  Past Medical History:  Diagnosis Date   Anxiety    Arthritis    left knee   Asthma    Atrial fibrillation ([place]) 02/16/2019   CHF (congestive heart failure) ([place]) 01/2019   Physician from [place] informed her she had this    Diabetes mellitus without complication ([place])    Dysrhythmia    Atrial fibrillation   GERD (gastroesophageal reflux disease)    Hiatal hernia    History of pulmonary embolus ([place])    Hypercholesterolemia    Macular degeneration    of left eye   MDD (major depressive disorder)    Osteoarthritis    knees   Pneumonia    in the past   PTSD (post-traumatic stress disorder)    Pulmonary embolism ([place])    bilateral, none since 2003   Seizure disorder ([place])    Per patient last seizure was in 2020.   Sleep apnea CPAP   [facility]  Uses CPAP   Stroke ([place])    1989 and 1995 (left sided weakness)   Vitamin D deficiency     Current Outpatient Medications:    HYDROcodone-acetaminophen (NORCO) 5-325 MG tablet, Take 1 tablet by mouth every 6 (six) hours as needed for moderate pain., Disp: 30 tablet, Rfl: 0   acetaminophen (TYLENOL) 500 MG tablet, Take 1,000 mg by mouth every 6 (six) hours as needed for moderate pain., Disp: , Rfl:    albuterol (VENTOLIN HFA) 108 (90 Base) MCG/ACT inhaler, Inhale 2 puffs into the lungs every 6  (six) hours as needed for wheezing or shortness of breath., Disp: , Rfl:    atorvastatin (LIPITOR) 40 MG tablet, Take 40 mg by mouth every evening., Disp: , Rfl:    BREZTRI AEROSPHERE 160-9-4.8 MCG/ACT AERO, Inhale 2 puffs into the lungs in the morning and at bedtime. , Disp: , Rfl:    bumetanide (BUMEX) 1 MG tablet, Take 1 mg by mouth daily., Disp: , Rfl:    Cholecalciferol (VITAMIN D-3) 25 MCG (1000 UT) CAPS, Take 1,000 Units by mouth daily., Disp: , Rfl:    flecainide (TAMBOCOR) 50 MG tablet, Take 50 mg by mouth 2 (two) times daily. , Disp: , Rfl:    HYDROcodone-acetaminophen (NORCO) 5-325 MG tablet, Take 1 tablet by mouth every 6 (six) hours as needed for moderate pain., Disp: 30 tablet, Rfl: 0   HYDROcodone-acetaminophen (NORCO) 5-325 MG tablet, Take 1 tablet by mouth every 6 (six) hours as needed for moderate pain., Disp: 30 tablet, Rfl: 0   hydrOXYzine (ATARAX) 25 MG tablet, TAKE 1-2 TABLETS AT BEDTIME FOR INSOMNIA, Disp: 60 tablet, Rfl: 0   levETIRAcetam (KEPPRA) 500 MG tablet, Take 1 tablet (500 mg total) by mouth 2 (two) times daily., Disp: 180 tablet, Rfl: 3  metFORMIN (GLUCOPHAGE) 500 MG tablet, Take 500 mg by mouth daily with breakfast., Disp: , Rfl:    metoCLOPramide (REGLAN) 10 MG tablet, Take 1 tablet (10 mg total) by mouth every 6 (six) hours as needed for nausea (nausea/headache)., Disp: 10 tablet, Rfl: 0   metoprolol succinate (TOPROL-XL) 25 MG 24 hr tablet, Take 25 mg by mouth daily., Disp: , Rfl:    Omega-3 Fatty Acids (FISH OIL) 1000 MG CAPS, Take 1,000 mg by mouth daily with breakfast. , Disp: , Rfl:    omeprazole (PRILOSEC) 40 MG capsule, Take 40 mg by mouth daily., Disp: , Rfl:    ondansetron (ZOFRAN-ODT) 4 MG disintegrating tablet, Take 1 tablet (4 mg total) by mouth every 8 (eight) hours as needed for nausea or vomiting., Disp: 20 tablet, Rfl: 0   rivaroxaban (XARELTO) 20 MG TABS tablet, Take 20 mg by mouth at bedtime. , Disp: , Rfl:    RYBELSUS 14 MG TABS, Take 14 mg by  mouth daily., Disp: , Rfl:    sertraline (ZOLOFT) 50 MG tablet, Take 50 mg by mouth daily., Disp: , Rfl:   Social History   Tobacco Use  Smoking Status Never  Smokeless Tobacco Never    Allergies  Allergen Reactions   Acetaminophen-Codeine Swelling, Rash and Other (See Comments)    Tylenol with Codeine, Tylenol #3,  (facial swelling, hives)   Bee Venom Anaphylaxis   Meloxicam Anaphylaxis and Rash   Tramadol Hives and Other (See Comments)    Patient stated "I was trippin' and I do not want that ever again"   Codeine Swelling    hives   Coconut (Cocos Nucifera) Rash and Other (See Comments)    ANY coconut products    Tomato Rash    Fresh    Objective:  There were no vitals filed for this visit. There is no height or weight on file to calculate BMI. Constitutional Well developed. Well nourished.  Vascular Foot warm and well perfused. Capillary refill normal to all digits.   Neurologic Normal speech. Oriented to person, place, and time. Epicritic sensation to light touch grossly present bilaterally.  Dermatologic Skin completely reepithelialized.  No signs of Deis is noted no complication noted.  Orthopedic: Mild tenderness to palpation noted about the surgical site.   Radiographs: 3 views of skeletally mature the right foot: Hardware is intact no signs of backing or loosening noted.  Osseous bridging/consolidated noted at the first metatarsophalangeal joint good position alignment noted.  No signs of fractures or breakdown of hardware noted Assessment:   1. Bunion, right foot   2. Status post surgery     Plan:  Patient was evaluated and treated and all questions answered.  S/p foot surgery right -Progressing as expected post-operatively. -XR: See above -WB Status: Patient can slowly weightbearing as tolerated with Cam boot and right lower extremity -Sutures: removed. No medical signs of dehiscence noted.  No complication noted.  We will move it during next  visit -Medications: None -Foot redressed. -CMN was never completed so we will restart the diabetic shoes  No follow-ups on file.

## 2022-06-21 ENCOUNTER — Ambulatory Visit (INDEPENDENT_AMBULATORY_CARE_PROVIDER_SITE_OTHER): Payer: Medicare Other | Admitting: Licensed Clinical Social Worker

## 2022-06-21 DIAGNOSIS — F4310 Post-traumatic stress disorder, unspecified: Secondary | ICD-10-CM

## 2022-06-21 DIAGNOSIS — F411 Generalized anxiety disorder: Secondary | ICD-10-CM | POA: Diagnosis not present

## 2022-06-21 DIAGNOSIS — F331 Major depressive disorder, recurrent, moderate: Secondary | ICD-10-CM

## 2022-06-21 NOTE — Progress Notes (Signed)
Virtual Visit via Video Note   I connected with Curley S. Bayne on 06/21/22 at 1:00pm by video enabled telemedicine application and verified that I am speaking with the correct person using two identifiers.   I discussed the limitations, risks, security and privacy concerns of performing an evaluation and management service by video and the availability of in person appointments. I also discussed with the patient that there may be a patient responsible charge related to this service. The patient expressed understanding and agreed to proceed.   I discussed the assessment and treatment plan with the patient. The patient was provided an opportunity to ask questions and all were answered. The patient agreed with the plan and demonstrated an understanding of the instructions.   The patient was advised to call back or seek an in-person evaluation if the symptoms worsen or if the condition fails to improve as anticipated.   I provided 1 hour of non-face-to-face time during this encounter.     [name], LCSW, LCAS _______________________ THERAPIST PROGRESS NOTE   Session Time:  1:00pm - 2:00pm      Location: Patient: Patient Home Provider: [facility]    Participation Level: Active    Behavioral Response: Alert, casually dressed, euthymic mood/affect   Type of Therapy:  Individual Therapy   Treatment Goals addressed: Mood management; Medication management; Maintaining boundaries with support network   Progress Towards Goals: Progressing    Interventions: CBT, psychoeducation on healthy vs unhealthy relationships   Summary: Habiba S. Strubel is a 56 year old divorced African American female that presented for therapy appointment today with diagnoses of Major Depressive Disorder, recurrent, moderate, Generalized Anxiety Disorder and PTSD.      Suicidal/Homicidal: None; without plan or intent                                                                                                                 Therapist Response:  Clinician met with Kym for virtual therapy appointment and assessed for safety, sobriety, and medication compliance.  Ruqayya presented for session on time and was alert, oriented x5, with no evidence or self-report of active SI/HI or A/V H.  Essie reported ongoing compliance with medication and denied any use of alcohol or illicit substances.  Clinician inquired about Yanin's current emotional ratings, as well as any significant changes in thoughts, feelings, or behavior since previous check-in.  Aishani reported scores of 0/10 for depression, 0/10 for anxiety, and 0/10 for anger/irritability.  Laqueshia denied any recent panic attacks or outbursts.  She reported that following our last session, she outreached her foot doctor, who asked her to come in, did an x-ray, and determined that her foot was swollen from her fall, but nothing was broken.  She stated "They wrapped, iced it, and gave me a shot for pain".  She rated her pain at 3/10 in severity and noted that she also had stiches removed successfully yesterday. Sruti reported that while she recuperates, she is spending time finishing her book.  Narissa reported that  one struggle has been dealing with her neighbor, who was friends with her, but recently things have been strained due to communication issues, stating "He gave me money as a gift for me and my grandson, and then changed his mind and said I was supposed to pay him back.  He knows I forget things, but I didn't forget about that ". Clinician assisted Eulala by covering a handout on subject of gaslighting, which defined this as a form of manipulation that causes a person to doubt their own beliefs, sanity or memory.  Clinician covered several common examples of gaslighting tactics, such as denial, distraction, avoidance, projection, minimization, threatening, sabotage, and more.  Clinician tasked [name] with identifying any behaviors she may have engaged in before, those she  recognizes in her friend, and alternative strategies that could be explored to extinguish behavior, improve communication and connectedness with friend.  Intervention was effective, as evidenced by Teela's active engagement in discussion, reporting that this helped her realize that this person may require a change in boundaries to protect her mental health and wellbeing, as he has engaged in several of these negative behaviors, including denial, projection, putting her down, distraction, minimization of her feelings and needs, as well as ignoring or avoiding confrontation of concerns between them.  Wyvonne stated "This is showing me I'm better off without him.  Ill keep it friendly and say 'Hi' if we encounter each other, but it won't go further than that.  I'm done".  Clinician will continue to monitor.     Plan: Follow up again in 1 week.    Diagnosis: Major depressive disorder, recurrent, moderate; Generalized Anxiety Disorder; and PTSD   Collaboration of Care:   No collaboration of care required for this visit.                                                   Patient/Guardian was advised Release of Information must be obtained prior to any record release in order to collaborate their care with an outside provider. Patient/Guardian was advised if they have not already done so to contact the registration department to sign all necessary forms in order for [name] to release information regarding their care.    Consent: Patient/Guardian gives verbal consent for treatment and assignment of benefits for services provided during this visit. Patient/Guardian expressed understanding and agreed to proceed.  [name], LCSW, LCAS 06/21/22

## 2022-06-25 ENCOUNTER — Ambulatory Visit (INDEPENDENT_AMBULATORY_CARE_PROVIDER_SITE_OTHER): Payer: Medicare Other | Admitting: Licensed Clinical Social Worker

## 2022-06-25 DIAGNOSIS — F411 Generalized anxiety disorder: Secondary | ICD-10-CM | POA: Diagnosis not present

## 2022-06-25 DIAGNOSIS — F4310 Post-traumatic stress disorder, unspecified: Secondary | ICD-10-CM

## 2022-06-25 DIAGNOSIS — F331 Major depressive disorder, recurrent, moderate: Secondary | ICD-10-CM | POA: Diagnosis not present

## 2022-06-25 NOTE — Progress Notes (Signed)
Virtual Visit via Video Note   I connected with Genita S. Furry on 06/25/22 at 2:00pm by video enabled telemedicine application and verified that I am speaking with the correct person using two identifiers.   I discussed the limitations, risks, security and privacy concerns of performing an evaluation and management service by video and the availability of in person appointments. I also discussed with the patient that there may be a patient responsible charge related to this service. The patient expressed understanding and agreed to proceed.   I discussed the assessment and treatment plan with the patient. The patient was provided an opportunity to ask questions and all were answered. The patient agreed with the plan and demonstrated an understanding of the instructions.   The patient was advised to call back or seek an in-person evaluation if the symptoms worsen or if the condition fails to improve as anticipated.   I provided 40 minutes of non-face-to-face time during this encounter.     [name], LCSW, LCAS _______________________ THERAPIST PROGRESS NOTE   Session Time:  2:00pm - 2:40pm      Location: Patient: Patient Home Provider: Clinical Home Office    Participation Level: Active    Behavioral Response: Alert, casually dressed, angry/irritable mood/affect     Type of Therapy:  Individual Therapy   Treatment Goals addressed: Mood management; Medication management; Improving communication skills   Progress Towards Goals: Progressing     Interventions: CBT: assertive communication skills    Summary: Irmgard S. Whitebread is a 56 year old divorced African American female that presented for therapy appointment today with diagnoses of Major Depressive Disorder, recurrent, moderate, Generalized Anxiety Disorder and PTSD.      Suicidal/Homicidal: None; without plan or intent                                                                                                                 Therapist Response:  Clinician met with Ikeya for virtual therapy session and assessed for safety, sobriety, and medication compliance.  Meryl presented for appointment on time and was alert, oriented x5, with no evidence or self-report of active SI/HI or A/V H.  Mariem reported ongoing compliance with medication and denied any use of alcohol or illicit substances.  Clinician inquired about Emmajean's emotional ratings today, as well as any significant changes in thoughts, feelings, or behavior since last check-in.  Gennesis reported scores of 0/10 for depression, 0/10 for anxiety, and 4/10 for anger/irritability.  Dnya denied any recent panic attacks or outbursts.  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  She reported that a recent struggle was learning about an upsetting incident that happened at her grandson's school, stating "I was furious a few minutes before we started talking".  Tytionna reported that her grandson had an issue with another student, and the principal got involved, but Daritza's daughter (the mother) was not informed until several weeks later.  Katlynne reported that she was very upset, and wanted to speak with the principal, but knew her emotions would be too strong at this time, which could result in a heated argument.  Clinician reviewed topic of assertive communication skills with Kendahl today to assist.  This included covering standard assertive traits (I.e. speaking with appropriate volume and tone, direct but not intense eye contact, etc) and strategies for expressing thoughts and feelings in a clear, respectful way to seek resolution with involved parties. Clinician also encouraged Quinci to link with school professionals such as the counseling department to provide additional assistance and resources to the grandson if needed. Intervention was effective, as evidenced by Nicki's active  engagement in discussion and receptiveness to strategies offered for resolving conflict with school officials without causing additional issues.  Lakaya reported that she will be proactive to set appropriate boundaries in future stressful situations to maintain positive mood.  Clinician will continue to monitor.        Plan: Follow up again in 1 week.    Diagnosis: Major depressive disorder, recurrent, moderate; Generalized Anxiety Disorder; and PTSD   Collaboration of Care:   No collaboration of care required for this visit.                                                   Patient/Guardian was advised Release of Information must be obtained prior to any record release in order to collaborate their care with an outside provider. Patient/Guardian was advised if they have not already done so to contact the registration department to sign all necessary forms in order for [name] to release information regarding their care.    Consent: Patient/Guardian gives verbal consent for treatment and assignment of benefits for services provided during this visit. Patient/Guardian expressed understanding and agreed to proceed.  [name], LCSW, LCAS 06/25/22

## 2022-06-28 ENCOUNTER — Ambulatory Visit: Payer: Medicare Other | Admitting: Primary Care

## 2022-07-01 NOTE — Progress Notes (Unsigned)
$'@Patient'g$  ID: Meghan [name], female    DOB: 08-25-1966, 56 y.o.   MRN: [id number]  No chief complaint on file.   Referring provider: Beverley Fiedler, *  HPI:  56 year old female, never smoked.  Past medical history significant for asthma, obstructive sleep apnea, A-fib, obesity.  Previous [facility] pulmonry encounter: 11/16/2021 Patient presents today for sleep consult. She is here to establish as a new patient. She has a history of sleep apnea, currently on CPAP. She wears CPAP every night. She is unsure setting. Over the last two months she has been consistently waking up at 2-3am and reports having trouble falling back asleep. She goes to bed around 9-10pm and starts her day between 7:30-8am. She has tried several different types of melatonin without improvement. She did not want to take diphenhydramine d.t kidneys. Her current resmed CPAP machine is working well. She wears size large full face mask. No large airleaks that she is aware of. She recently received new supplies. DME compnay is [employer].   Sleep questionnaire Symptoms- Snoring, restless sleep, daytime sleepiness      Prior sleep study- In 2020 ( we do not have copy) Bedtime- 9-10pm  Time to fall asleep- 15-20 mins Nocturnal awakenings- once around 2-3am  Out of bed/start of day- 7:30-8am  Weight changes- down 48lbs Do you operate heavy machinery- No Do you currently wear CPAP- Yes Do you current wear oxygen- Yes [facility]- 16   05/24/2022- Interim hx  Patient returns today for sleep follow-up.  She continues to have symptoms of restless sleep.  She wakes up on average every 90 minutes and has trouble falling back to sleep.  She has tried melatonin and ZzzQuil without improvement.  Typical bedtime is 10 PM.  She starts her day at 7:30 AM.  She reports compliance with CPAP however we only have a download from September 2022 to October 2022 she does not have an SD card in her machine.  Previous CPAP pressure was 13 cm H2O and she  had a residual AHI of 1.7.  She had large amount of air leaks.  She tells me that she has received a new CPAP facemask.  Her DME company is [employer].  We received a copy of her sleep study results from May 16, 2019 that showed evidence of severe obstructive sleep apnea, overall AHI was 32.1 an hour with desaturations to 62%.  She had severe snoring.  Optimal CPAP pressure at that time was 9 cm H2O.  Obstructive sleep apnea syndrome - Sleep study on 05/16/2019 showed evidence of severe obstructive sleep apnea, overall AHI 32.1 an hour.  Patient is maintained on CPAP, previous pressure settings were 13 cm H2O.  She reports compliance with use however [employer] download is not up-to-date and she does not have an SD card.  She continues to have interrupted fragmented sleep.  Recommend CPAP titration study in lab. FU in 4-6 weeks.    Insomnia -  She has difficulty maintaining sleep.  She will have a hard time falling back to sleep once she is awake.  She has tried melatonin and ZzzQuil without improvement.  Sending in Rx for Atarax 25 to 50 mg at bedtime for insomnia.      07/02/2022 Patient presents today for 4-6 week follow-up. She had a sleep study in August 2020 that showed severe OSA, overall AHI was 32.1 an hour with desaturations to 62%. Previous CPAP pressure 13cm h20. Experiencing large amt of air leaks. No compliance report available. She reports  continued disrupted/fragmented sleep. Started her on Atarax 25-'50mg'$  at bedtime for insomnia.   CPAP titration on 06/15/22 showed optimal pressure 11cm h20 with medium airfit N20 full face mask      Allergies  Allergen Reactions   Acetaminophen-Codeine Swelling, Rash and Other (See Comments)    Tylenol with Codeine, Tylenol #3,  (facial swelling, hives)   Bee Venom Anaphylaxis   Meloxicam Anaphylaxis and Rash   Tramadol Hives and Other (See Comments)    Patient stated "I was trippin' and I do not want that ever again"   Codeine Swelling    hives    Coconut (Cocos Nucifera) Rash and Other (See Comments)    ANY coconut products    Tomato Rash    Fresh     Immunization History  Administered Date(s) Administered   Influenza Split 05/22/2019, 07/18/2021   Influenza, High Dose Seasonal PF 05/22/2019   Influenza-Unspecified 05/28/2019   PFIZER(Purple Top)SARS-COV-2 Vaccination 12/18/2019, 01/18/2020    Past Medical History:  Diagnosis Date   Anxiety    Arthritis    left knee   Asthma    Atrial fibrillation ([place]) 02/16/2019   CHF (congestive heart failure) ([place]) 01/2019   Physician from [place] informed her she had this    Diabetes mellitus without complication ([place])    Dysrhythmia    Atrial fibrillation   GERD (gastroesophageal reflux disease)    Hiatal hernia    History of pulmonary embolus ([place])    Hypercholesterolemia    Macular degeneration    of left eye   MDD (major depressive disorder)    Osteoarthritis    knees   Pneumonia    in the past   PTSD (post-traumatic stress disorder)    Pulmonary embolism ([place])    bilateral, none since 2003   Seizure disorder ([place])    Per patient last seizure was in 2020.   Sleep apnea CPAP   [facility]  Uses CPAP   Stroke ([place])    1989 and 1995 (left sided weakness)   Vitamin D deficiency     Tobacco History: Social History   Tobacco Use  Smoking Status Never  Smokeless Tobacco Never   Counseling given: Not Answered   Outpatient Medications Prior to Visit  Medication Sig Dispense Refill   acetaminophen (TYLENOL) 500 MG tablet Take 1,000 mg by mouth every 6 (six) hours as needed for moderate pain.     albuterol (VENTOLIN HFA) 108 (90 Base) MCG/ACT inhaler Inhale 2 puffs into the lungs every 6 (six) hours as needed for wheezing or shortness of breath.     atorvastatin (LIPITOR) 40 MG tablet Take 40 mg by mouth every evening.     BREZTRI AEROSPHERE 160-9-4.8 MCG/ACT AERO Inhale 2 puffs into the lungs in the morning and at bedtime.      bumetanide (BUMEX) 1 MG tablet Take 1 mg  by mouth daily.     Cholecalciferol (VITAMIN D-3) 25 MCG (1000 UT) CAPS Take 1,000 Units by mouth daily.     flecainide (TAMBOCOR) 50 MG tablet Take 50 mg by mouth 2 (two) times daily.      HYDROcodone-acetaminophen (NORCO) 5-325 MG tablet Take 1 tablet by mouth every 6 (six) hours as needed for moderate pain. 30 tablet 0   HYDROcodone-acetaminophen (NORCO) 5-325 MG tablet Take 1 tablet by mouth every 6 (six) hours as needed for moderate pain. 30 tablet 0   HYDROcodone-acetaminophen (NORCO) 5-325 MG tablet Take 1 tablet by mouth every 6 (six) hours as needed for moderate  pain. 30 tablet 0   hydrOXYzine (ATARAX) 25 MG tablet TAKE 1-2 TABLETS AT BEDTIME FOR INSOMNIA 60 tablet 0   levETIRAcetam (KEPPRA) 500 MG tablet Take 1 tablet (500 mg total) by mouth 2 (two) times daily. 180 tablet 3   metFORMIN (GLUCOPHAGE) 500 MG tablet Take 500 mg by mouth daily with breakfast.     metoCLOPramide (REGLAN) 10 MG tablet Take 1 tablet (10 mg total) by mouth every 6 (six) hours as needed for nausea (nausea/headache). 10 tablet 0   metoprolol succinate (TOPROL-XL) 25 MG 24 hr tablet Take 25 mg by mouth daily.     Omega-3 Fatty Acids (FISH OIL) 1000 MG CAPS Take 1,000 mg by mouth daily with breakfast.      omeprazole (PRILOSEC) 40 MG capsule Take 40 mg by mouth daily.     ondansetron (ZOFRAN-ODT) 4 MG disintegrating tablet Take 1 tablet (4 mg total) by mouth every 8 (eight) hours as needed for nausea or vomiting. 20 tablet 0   rivaroxaban (XARELTO) 20 MG TABS tablet Take 20 mg by mouth at bedtime.      RYBELSUS 14 MG TABS Take 14 mg by mouth daily.     sertraline (ZOLOFT) 50 MG tablet Take 50 mg by mouth daily.     No facility-administered medications prior to visit.      Review of Systems  Review of Systems   Physical Exam  There were no vitals taken for this visit. Physical Exam   Lab Results:  CBC    Component Value Date/Time   WBC 9.0 01/01/2022 1040   RBC 3.93 01/01/2022 1040   HGB 11.9 (L)  05/28/2022 0904   HCT 35.0 (L) 05/28/2022 0904   PLT 235 01/01/2022 1040   MCV 85.5 01/01/2022 1040   MCH 26.2 01/01/2022 1040   MCHC 30.7 01/01/2022 1040   RDW 14.7 01/01/2022 1040   LYMPHSABS 1.4 11/27/2021 0931   MONOABS 0.4 11/27/2021 0931   EOSABS 0.1 11/27/2021 0931   BASOSABS 0.0 11/27/2021 0931    BMET    Component Value Date/Time   NA 140 05/28/2022 0904   K 4.5 05/28/2022 0904   CL 104 05/28/2022 0904   CO2 27 01/01/2022 1040   GLUCOSE 92 05/28/2022 0904   BUN 22 (H) 05/28/2022 0904   CREATININE 0.80 05/28/2022 0904   CALCIUM 9.8 01/01/2022 1040   GFRNONAA >60 01/01/2022 1040   GFRAA >60 05/11/2020 1701    BNP    Component Value Date/Time   BNP 92.4 02/01/2021 1034    ProBNP No results found for: "PROBNP"  Imaging: SLEEP STUDY DOCUMENTS  Result Date: 06/19/2022 Ordered by an unspecified provider.  Cpap titration  Result Date: 06/14/2022 Rigoberto Noel, MD     06/15/2022  8:54 AM Patient Name: [name] Study Date: 06/14/2022 Gender: Female D.O.B: 12-May-1966 Age (years): 79 Referring Provider: Geraldo Pitter NP Height (inches): 66 Interpreting Physician: Kara Mead MD, ABSM Weight (lbs): 288 RPSGT: Jorge Ny BMI: 46 MRN: [id number] Neck Size: 15.75 <br> <br> CLINICAL INFORMATION The patient is referred for a CPAP titration to treat sleep apnea. NPSG 05/16/2019 showed severe obstructive sleep apnea, overall AHI was 32.1 an hour with desaturations to 62%.  SLEEP STUDY TECHNIQUE As per the AASM Manual for the Scoring of Sleep and Associated Events v2.3 (April 2016) with a hypopnea requiring 4% desaturations. The channels recorded and monitored were frontal, central and occipital EEG, electrooculogram (EOG), submentalis EMG (chin), nasal and oral airflow, thoracic and abdominal wall motion, anterior  tibialis EMG, snore microphone, electrocardiogram, and pulse oximetry. Continuous positive airway pressure (CPAP) was initiated at the beginning of the study and  titrated to treat sleep-disordered breathing. MEDICATIONS Medications self-administered by patient taken the night of the study : ATORVASTATIN, FLECAINIDE ACETATE, HYDROCODONE W/ACETAMINOPHEN, Xarelto TECHNICIAN COMMENTS Comments added by technician: none Comments added by scorer: N/A RESPIRATORY PARAMETERS Optimal PAP Pressure (cm): 11 AHI at Optimal Pressure (/hr): 2.7 Overall Minimal O2 (%): 85.0 Supine % at Optimal Pressure (%): 0 Minimal O2 at Optimal Pressure (%): 85.0 SLEEP ARCHITECTURE The study was initiated at 10:03:15 PM and ended at 5:03:32 AM. Sleep onset time was 21.4 minutes and the sleep efficiency was 90.1%%. The total sleep time was 378.5 minutes. The patient spent 0.8%% of the night in stage N1 sleep, 69.2%% in stage N2 sleep, 0.0%% in stage N3 and 30% in REM.Stage REM latency was 48.5 minutes Wake after sleep onset was 20.4. Alpha intrusion was absent. Supine sleep was 37.37%. CARDIAC DATA The 2 lead EKG demonstrated atrial fibrillation. The mean heart rate was 86.6 beats per minute. Other EKG findings include: None. LEG MOVEMENT DATA The total Periodic Limb Movements of Sleep (PLMS) were 0. The PLMS index was 0.0. A PLMS index of <15 is considered normal in adults. IMPRESSIONS - An optimal PAP pressure of 11 cm was selected for this patient based on the available study data. - Moderate oxygen desaturations were observed during this titration (min O2 = 85.0%). - The patient snored with soft snoring volume during this titration study. - No cardiac abnormalities were observed during this study. - Clinically significant periodic limb movements were not noted during this study. Arousals associated with PLMs were rare. DIAGNOSIS - Obstructive Sleep Apnea (G47.33) RECOMMENDATIONS - Trial of CPAP 11 cm with medium airfit N20 full face mask - Avoid alcohol, sedatives and other CNS depressants that may worsen sleep apnea and disrupt normal sleep architecture. - Sleep hygiene should be reviewed to assess  factors that may improve sleep quality. - Weight management and regular exercise should be initiated or continued. - Return to [facility] for re-evaluation after 4 weeks of therapy Kara Mead MD Board Certified in [place] Complete Right  Result Date: 06/13/2022 Please see detailed radiograph report in office note.  DG Foot Complete Right  Result Date: 06/07/2022 Please see detailed radiograph report in office note.    Assessment & Plan:   No problem-specific Assessment & Plan notes found for this encounter.     Martyn Ehrich, NP 07/01/2022

## 2022-07-02 ENCOUNTER — Ambulatory Visit (INDEPENDENT_AMBULATORY_CARE_PROVIDER_SITE_OTHER): Payer: Medicare Other | Admitting: Primary Care

## 2022-07-02 ENCOUNTER — Ambulatory Visit (INDEPENDENT_AMBULATORY_CARE_PROVIDER_SITE_OTHER): Payer: Medicare Other | Admitting: Licensed Clinical Social Worker

## 2022-07-02 ENCOUNTER — Encounter: Payer: Self-pay | Admitting: Primary Care

## 2022-07-02 DIAGNOSIS — G4700 Insomnia, unspecified: Secondary | ICD-10-CM | POA: Diagnosis not present

## 2022-07-02 DIAGNOSIS — F411 Generalized anxiety disorder: Secondary | ICD-10-CM

## 2022-07-02 DIAGNOSIS — F331 Major depressive disorder, recurrent, moderate: Secondary | ICD-10-CM | POA: Diagnosis not present

## 2022-07-02 DIAGNOSIS — G4733 Obstructive sleep apnea (adult) (pediatric): Secondary | ICD-10-CM

## 2022-07-02 DIAGNOSIS — F4310 Post-traumatic stress disorder, unspecified: Secondary | ICD-10-CM | POA: Diagnosis not present

## 2022-07-02 MED ORDER — HYDROXYZINE HCL 25 MG PO TABS: ORAL_TABLET | ORAL | 1 refills | Status: DC

## 2022-07-02 NOTE — Progress Notes (Signed)
Virtual Visit via Video Note   I connected with Meghan [name] on 07/02/22 at 2:00pm by video enabled telemedicine application and verified that I am speaking with the correct person using two identifiers.   I discussed the limitations, risks, security and privacy concerns of performing an evaluation and management service by video and the availability of in person appointments. I also discussed with the patient that there may be a patient responsible charge related to this service. The patient expressed understanding and agreed to proceed.   I discussed the assessment and treatment plan with the patient. The patient was provided an opportunity to ask questions and all were answered. The patient agreed with the plan and demonstrated an understanding of the instructions.   The patient was advised to call back or seek an in-person evaluation if the symptoms worsen or if the condition fails to improve as anticipated.   I provided 50 minutes of non-face-to-face time during this encounter.     [name], LCSW, LCAS _______________________ THERAPIST PROGRESS NOTE   Session Time:  2:00pm - 2:50pm   Location: Patient: Patient Home Provider: Clinical Home Office    Participation Level: Active    Behavioral Response: Alert, casually dressed, angry/irritable mood/affect    Type of Therapy:  Individual Therapy   Treatment Goals addressed: Mood management; Medication management; Setting healthy boundaries; Attending church regularly   Progress Towards Goals: Progressing    Interventions: CBT, problem solving, establishing healthier boundaries, [facility] referral     Summary: Meghan [name] is a 56 year old divorced African American female that presented for therapy appointment today with diagnoses of Major Depressive Disorder, recurrent, moderate, Generalized Anxiety Disorder and PTSD.      Suicidal/Homicidal: None; without plan or intent                                                                                                                 Therapist Response:  Clinician met with Meghan [name] for virtual therapy appointment and assessed for safety, sobriety, and medication compliance.  Meghan [name] presented for session on time and was alert, oriented x5, with no evidence or self-report of active SI/HI or A/V H.  Meghan [name] reported ongoing compliance with medication and denied any use of alcohol or illicit substances.  Clinician inquired about Meghan [name] current emotional ratings, as well as any significant changes in thoughts, feelings, or behavior since previous check-in.  Meghan [name] reported scores of 8/10 for depression, 7/10 for anxiety, and 10/10 for anger/irritability.  Meghan [name] denied any recent panic attacks.  Meghan [name] reported that a recent struggle was having an outburst yesterday when she attended an event at her church and learned that some people were spreading gossip about her, stating "Some things got back to me and it really hurt.  They said I was lying about my health.  I'm tired of people acting like this".  Meghan [name] reported that she has contemplated leaving this church because of related stress.  Clinician assisted Meghan [name] in running a cost benefit analysis regarding whether or not  to return to this church based upon the negative impact it is having on her mental health.  Clinician also reminded Meghan [name] of previous churches she discovered online which might offer a more supportive community that aligns with her values.  Meghan [name] participated in analysis, reporting that she also spoke to her pastor yesterday about this, stating "I'm not happy, and these smaller churches, it seems like people are always in your business".  She reported that she is not ready to leave the church yet despite this.  Clinician also reviewed strategies for establishing healthier boundaries with problematic members of the congregation in order to help Meghan [name] reduce negative impact on her mental health, including gossip that is shared with her.  Interventions were effective, Meghan [name] reported that she will continue to attend sermons at her church but make an effort to set boundaries with 'toxic' members of the church community while she contemplates her next move.  Clinician and Meghan [name] also discussed possibility of her returning to [facility] due to increased depression and stress, as well as lack of support.  Meghan [name] reported that she would like to think it over for 1 day, so clinician will outreach [facility] case manager to inform of her interest, and continue to monitor.    Plan: Follow up again in 1 week.    Diagnosis: Major depressive disorder, recurrent, moderate; Generalized Anxiety Disorder; and PTSD   Collaboration of Care:   No collaboration of care required for this visit.                                                   Patient/Guardian was advised Release of Information must be obtained prior to any record release in order to collaborate their care with an outside provider. Patient/Guardian was advised if they have not already done so to contact the registration department to sign all necessary forms in order for [name] to release information regarding their care.    Consent: Patient/Guardian gives verbal consent for treatment and assignment of benefits for services provided during this visit. Patient/Guardian expressed understanding and agreed to proceed.  [name], [place], LCAS 07/02/22

## 2022-07-02 NOTE — Patient Instructions (Addendum)
[facility] for sleep training [phone] Continue Atarax 25-'50mg'$  at bedtime for insomnia  Aim to get 6-8 hours of sleep a night Wear CPAP every night   Orders: Change CPAP pressure 10-15cm h20 Needs SD card  Follow-up: 6 months with Beth NP   CPAP and BIPAP Information CPAP and BIPAP are methods that use air pressure to keep your airways open and to help you breathe well. CPAP and BIPAP use different amounts of pressure. Your health care provider will tell you whether CPAP or BIPAP would be more helpful for you. CPAP stands for "continuous positive airway pressure." With CPAP, the amount of pressure stays the same while you breathe in (inhale) and out (exhale). BIPAP stands for "bi-level positive airway pressure." With BIPAP, the amount of pressure will be higher when you inhale and lower when you exhale. This allows you to take larger breaths. CPAP or BIPAP may be used in the hospital, or your health care provider may want you to use it at home. You may need to have a sleep study before your health care provider can order a machine for you to use at home. What are the advantages? CPAP or BIPAP can be helpful if you have: Sleep apnea. Chronic obstructive pulmonary disease (COPD). Heart failure. Medical conditions that cause muscle weakness, including muscular dystrophy or amyotrophic lateral sclerosis (ALS). Other problems that cause breathing to be shallow, weak, abnormal, or difficult. CPAP and BIPAP are most commonly used for obstructive sleep apnea (OSA) to keep the airways from collapsing when the muscles relax during sleep. What are the risks? Generally, this is a safe treatment. However, problems may occur, including: Irritated skin or skin sores if the mask does not fit properly. Dry or stuffy nose or nosebleeds. Dry mouth. Feeling gassy or bloated. Sinus or lung infection if the equipment is not cleaned properly. When should CPAP or BIPAP be  used? In most cases, the mask only needs to be worn during sleep. Generally, the mask needs to be worn throughout the night and during any daytime naps. People with certain medical conditions may also need to wear the mask at other times, such as when they are awake. Follow instructions from your health care provider about when to use the machine. What happens during CPAP or BIPAP?  Both CPAP and BIPAP are provided by a small machine with a flexible plastic tube that attaches to a plastic mask that you wear. Air is blown through the mask into your nose or mouth. The amount of pressure that is used to blow the air can be adjusted on the machine. Your health care provider will set the pressure setting and help you find the best mask for you. Tips for using the mask Because the mask needs to be snug, some people feel trapped or closed-in (claustrophobic) when first using the mask. If you feel this way, you may need to get used to the mask. One way to do this is to hold the mask loosely over your nose or mouth and then gradually apply the mask more snugly. You can also gradually increase the amount of time that you use the mask. Masks are available in various types and sizes. If your mask does not fit well, talk with your health care provider about getting a different one. Some common types of masks include: Full face masks, which fit over the mouth and nose. Nasal masks, which fit over the nose. Nasal pillow or prong masks, which fit into  the nostrils. If you are using a mask that fits over your nose and you tend to breathe through your mouth, a chin strap may be applied to help keep your mouth closed. Use a skin barrier to protect your skin as told by your health care provider. Some CPAP and BIPAP machines have alarms that may sound if the mask comes off or develops a leak. If you have trouble with the mask, it is very important that you talk with your health care provider about finding a way to make the  mask easier to tolerate. Do not stop using the mask. There could be a negative impact on your health if you stop using the mask. Tips for using the machine Place your CPAP or BIPAP machine on a secure table or stand near an electrical outlet. Know where the on/off switch is on the machine. Follow instructions from your health care provider about how to set the pressure on your machine and when you should use it. Do not eat or drink while the CPAP or BIPAP machine is on. Food or fluids could get pushed into your lungs by the pressure of the CPAP or BIPAP. For home use, CPAP and BIPAP machines can be rented or purchased through home health care companies. Many different brands of machines are available. Renting a machine before purchasing may help you find out which particular machine works well for you. Your health insurance company may also decide which machine you may get. Keep the CPAP or BIPAP machine and attachments clean. Ask your health care provider for specific instructions. Check the humidifier if you have a dry stuffy nose or nosebleeds. Make sure it is working correctly. Follow these instructions at home: Take over-the-counter and prescription medicines only as told by your health care provider. Ask if you can take sinus medicine if your sinuses are blocked. Do not use any products that contain nicotine or tobacco. These products include cigarettes, chewing tobacco, and vaping devices, such as e-cigarettes. If you need help quitting, ask your health care provider. Keep all follow-up visits. This is important. Contact a health care provider if: You have redness or pressure sores on your head, face, mouth, or nose from the mask or head gear. You have trouble using the CPAP or BIPAP machine. You cannot tolerate wearing the CPAP or BIPAP mask. Someone tells you that you snore even when wearing your CPAP or BIPAP. Get help right away if: You have trouble breathing. You feel  confused. Summary CPAP and BIPAP are methods that use air pressure to keep your airways open and to help you breathe well. If you have trouble with the mask, it is very important that you talk with your health care provider about finding a way to make the mask easier to tolerate. Do not stop using the mask. There could be a negative impact to your health if you stop using the mask. Follow instructions from your health care provider about when to use the machine. This information is not intended to replace advice given to you by your health care provider. Make sure you discuss any questions you have with your health care provider. Document Revised: 04/12/2021 Document Reviewed: 08/12/2020 Elsevier Patient Education  [place].  Insomnia Insomnia is a sleep disorder that makes it difficult to fall asleep or stay asleep. Insomnia can cause fatigue, low energy, difficulty concentrating, mood swings, and poor performance at work or school. There are three different ways to classify insomnia: Difficulty falling asleep. Difficulty  staying asleep. Waking up too early in the morning. Any type of insomnia can be long-term (chronic) or short-term (acute). Both are common. Short-term insomnia usually lasts for 3 months or less. Chronic insomnia occurs at least three times a week for longer than 3 months. What are the causes? Insomnia may be caused by another condition, situation, or substance, such as: Having certain mental health conditions, such as anxiety and depression. Using caffeine, alcohol, tobacco, or drugs. Having gastrointestinal conditions, such as gastroesophageal reflux disease (GERD). Having certain medical conditions. These include: Asthma. Alzheimer's disease. Stroke. Chronic pain. An overactive thyroid gland (hyperthyroidism). Other sleep disorders, such as restless legs syndrome and sleep apnea. Menopause. Sometimes, the cause of insomnia may not be known. What increases  the risk? Risk factors for insomnia include: Gender. Females are affected more often than males. Age. Insomnia is more common as people get older. Stress and certain medical and mental health conditions. Lack of exercise. Having an irregular work schedule. This may include working night shifts and traveling between different time zones. What are the signs or symptoms? If you have insomnia, the main symptom is having trouble falling asleep or having trouble staying asleep. This may lead to other symptoms, such as: Feeling tired or having low energy. Feeling nervous about going to sleep. Not feeling rested in the morning. Having trouble concentrating. Feeling irritable, anxious, or depressed. How is this diagnosed? This condition may be diagnosed based on: Your symptoms and medical history. Your health care provider may ask about: Your sleep habits. Any medical conditions you have. Your mental health. A physical exam. How is this treated? Treatment for insomnia depends on the cause. Treatment may focus on treating an underlying condition that is causing the insomnia. Treatment may also include: Medicines to help you sleep. Counseling or therapy. Lifestyle adjustments to help you sleep better. Follow these instructions at home: Eating and drinking  Limit or avoid alcohol, caffeinated beverages, and products that contain nicotine and tobacco, especially close to bedtime. These can disrupt your sleep. Do not eat a large meal or eat spicy foods right before bedtime. This can lead to digestive discomfort that can make it hard for you to sleep. Sleep habits  Keep a sleep diary to help you and your health care provider figure out what could be causing your insomnia. Write down: When you sleep. When you wake up during the night. How well you sleep and how rested you feel the next day. Any side effects of medicines you are taking. What you eat and drink. Make your bedroom a dark,  comfortable place where it is easy to fall asleep. Put up shades or blackout curtains to block light from outside. Use a white noise machine to block noise. Keep the temperature cool. Limit screen use before bedtime. This includes: Not watching TV. Not using your smartphone, tablet, or computer. Stick to a routine that includes going to bed and waking up at the same times every day and night. This can help you fall asleep faster. Consider making a quiet activity, such as reading, part of your nighttime routine. Try to avoid taking naps during the day so that you sleep better at night. Get out of bed if you are still awake after 15 minutes of trying to sleep. Keep the lights down, but try reading or doing a quiet activity. When you feel sleepy, go back to bed. General instructions Take over-the-counter and prescription medicines only as told by your health care provider. Exercise regularly as  told by your health care provider. However, avoid exercising in the hours right before bedtime. Use relaxation techniques to manage stress. Ask your health care provider to suggest some techniques that may work well for you. These may include: Breathing exercises. Routines to release muscle tension. Visualizing peaceful scenes. Make sure that you drive carefully. Do not drive if you feel very sleepy. Keep all follow-up visits. This is important. Contact a health care provider if: You are tired throughout the day. You have trouble in your daily routine due to sleepiness. You continue to have sleep problems, or your sleep problems get worse. Get help right away if: You have thoughts about hurting yourself or someone else. Get help right away if you feel like you may hurt yourself or others, or have thoughts about taking your own life. Go to your nearest emergency room or: Call 911. Call the [employer] at [phone] or 988. This is open 24 hours a day. Text the Crisis Text  Line at 939-146-4662. Summary Insomnia is a sleep disorder that makes it difficult to fall asleep or stay asleep. Insomnia can be long-term (chronic) or short-term (acute). Treatment for insomnia depends on the cause. Treatment may focus on treating an underlying condition that is causing the insomnia. Keep a sleep diary to help you and your health care provider figure out what could be causing your insomnia. This information is not intended to replace advice given to you by your health care provider. Make sure you discuss any questions you have with your health care provider. Document Revised: 08/14/2021 Document Reviewed: 08/14/2021 Elsevier Patient Education  [place].

## 2022-07-02 NOTE — Assessment & Plan Note (Signed)
-   CPAP titration on 06/15/22 showed optimal pressure 11cm h20 with medium airfit N20 full face mask - Airview download from machine showed 93% compliance with use > 4 hours over the last 30 days - Changing CPAP pressure auto 10-15cm h20 - Needs SD card  - DME [employer] for supplies/[employer] machine  - FU in 6 months or sooner if needed

## 2022-07-02 NOTE — Assessment & Plan Note (Addendum)
-   Continues to have fragmented sleep. Atarax has helped some - Recommend CBT for sleep training, patient was provided contact number

## 2022-07-04 ENCOUNTER — Telehealth (HOSPITAL_COMMUNITY): Payer: Self-pay | Admitting: Psychiatry

## 2022-07-04 NOTE — Telephone Encounter (Signed)
D:  [name] counselor [name], [place]) requested the case manager reach out to discuss MH-IOP with pt.  A:  Re-oriented pt.  Pt will start [facility] on 07-10-22.  Inform Cory.  R:  Pt receptive.

## 2022-07-04 NOTE — Progress Notes (Signed)
Reviewed and agree with assessment/plan.   Chesley Mires, MD [facility] Pulmonary/Critical Care 07/04/2022, 9:19 AM Pager:  [phone]

## 2022-07-09 ENCOUNTER — Ambulatory Visit (INDEPENDENT_AMBULATORY_CARE_PROVIDER_SITE_OTHER): Payer: Medicare Other | Admitting: Licensed Clinical Social Worker

## 2022-07-09 DIAGNOSIS — F331 Major depressive disorder, recurrent, moderate: Secondary | ICD-10-CM | POA: Diagnosis not present

## 2022-07-09 DIAGNOSIS — F411 Generalized anxiety disorder: Secondary | ICD-10-CM

## 2022-07-09 DIAGNOSIS — F4310 Post-traumatic stress disorder, unspecified: Secondary | ICD-10-CM | POA: Diagnosis not present

## 2022-07-09 NOTE — Progress Notes (Signed)
Virtual Visit via Video Note   I connected with Caterine S. Lardner on 07/09/22 at 2:00pm by video enabled telemedicine application and verified that I am speaking with the correct person using two identifiers.   I discussed the limitations, risks, security and privacy concerns of performing an evaluation and management service by video and the availability of in person appointments. I also discussed with the patient that there may be a patient responsible charge related to this service. The patient expressed understanding and agreed to proceed.   I discussed the assessment and treatment plan with the patient. The patient was provided an opportunity to ask questions and all were answered. The patient agreed with the plan and demonstrated an understanding of the instructions.   The patient was advised to call back or seek an in-person evaluation if the symptoms worsen or if the condition fails to improve as anticipated.   I provided 34 minutes of non-face-to-face time during this encounter.     [name], LCSW, LCAS _______________________ THERAPIST PROGRESS NOTE   Session Time:  2:00pm - 2:34pm   Location: Patient: Patient Home Provider: Clinical Home Office    Participation Level: Active    Behavioral Response: Alert, casually dressed, anxious mood/affect    Type of Therapy:  Individual Therapy   Treatment Goals addressed: Mood management; Medication management  Progress Towards Goals: Progressing    Interventions: CBT: challenging anxious thoughts    Summary: Tahisha S. Unterreiner is a 56 year old divorced African American female that presented for therapy appointment today with diagnoses of Major Depressive Disorder, recurrent, moderate, Generalized Anxiety Disorder and PTSD.      Suicidal/Homicidal: None; without plan or intent                                                                                                                Therapist Response:  Clinician met with Nuria for  virtual therapy session and assessed for safety, sobriety, and medication compliance.  [name] presented for appointment on time and was alert, oriented x5, with no evidence or self-report of active SI/HI or A/V H.  Haelyn reported ongoing compliance with medication and denied any use of alcohol or illicit substances.  Clinician inquired about Icel's emotional ratings today, as well as any significant changes in thoughts, feelings, or behavior since last check-in.  Kateline reported scores of 0/10 for depression, 6/10 for anxiety, and 0/10 for anger/irritability.  Emalyn denied any recent panic attacks or outbursts.  Thy reported that a recent success was making the decision to begin [facility]OP tomorrow, stating "I thought about it and I need the extra help".  Gretta reported that she also attended church over the weekend despite having some reservations.  She reported that a struggle has been worrying about starting her first group tomorrow.  Clinician utilized handout in session today titled "Worry exploration" in order to assist Faten in reducing her anxiety related to upcoming group session.  This worksheet featured a series of Socratic questions aimed at exploring the most likely outcomes  for a situation of concern, rather than focusing on the worst possible outcome (i.e. catastrophizing).  Clinician assisted Lynnsie in identifying and challenging any irrational beliefs related to this worry, in addition to utilizing problem solving approach to explore strategies which would help her accomplish goal of successfully attending first group session tomorrow with minimal issues.  Alitzel actively participated in discussion on handout, reporting that she is worried that the group environment will be stressful, she will be afraid to share, and not get much out of the experience.  Madelon reported that there is sufficient evidence to suggest she will be fine, as she is already familiar with the facilitator, there will be no female group  members at this time, and she has successfully attended MHIOP before without issue. She reported that she would also plan to practice her relaxation skills to address any anxiety that flares up in session. Intervention was effective, as evidenced by Henleigh reporting that discussion on this subject reduced her anxiety down to 4/10, and increased her confidence in her ability to attend MHIOP successfully.  Kataleia stated "I think it will be alright once I'm actually in there a few days".  Clinician will continue to monitor.    Plan: Follow up again in 1 week.    Diagnosis: Major depressive disorder, recurrent, moderate; Generalized Anxiety Disorder; and PTSD   Collaboration of Care:   No collaboration of care required for this visit.                                                   Patient/Guardian was advised Release of Information must be obtained prior to any record release in order to collaborate their care with an outside provider. Patient/Guardian was advised if they have not already done so to contact the registration department to sign all necessary forms in order for [name] to release information regarding their care.    Consent: Patient/Guardian gives verbal consent for treatment and assignment of benefits for services provided during this visit. Patient/Guardian expressed understanding and agreed to proceed.  [name], [place], LCAS 07/09/22

## 2022-07-10 ENCOUNTER — Encounter (HOSPITAL_COMMUNITY): Payer: Self-pay | Admitting: Psychiatry

## 2022-07-10 ENCOUNTER — Other Ambulatory Visit (HOSPITAL_COMMUNITY): Payer: Medicare Other | Attending: Licensed Clinical Social Worker | Admitting: Psychiatry

## 2022-07-10 DIAGNOSIS — F4321 Adjustment disorder with depressed mood: Secondary | ICD-10-CM | POA: Diagnosis not present

## 2022-07-10 DIAGNOSIS — F4310 Post-traumatic stress disorder, unspecified: Secondary | ICD-10-CM | POA: Diagnosis not present

## 2022-07-10 DIAGNOSIS — F331 Major depressive disorder, recurrent, moderate: Secondary | ICD-10-CM | POA: Diagnosis present

## 2022-07-10 DIAGNOSIS — F411 Generalized anxiety disorder: Secondary | ICD-10-CM | POA: Diagnosis not present
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
## 2022-07-10 NOTE — Plan of Care (Signed)
Pt is an active participant in all the groups. 

## 2022-07-10 NOTE — Progress Notes (Signed)
Virtual Visit via Video Note  I connected with Meghan [name] on '@TODAY'$ @ at  9:00 AM EDT by a video enabled telemedicine application and verified that I am speaking with the correct person using two identifiers.  Location: Patient: at home Provider: at office   I discussed the limitations of evaluation and management by telemedicine and the availability of in person appointments. The patient expressed understanding and agreed to proceed.   I discussed the assessment and treatment plan with the patient. The patient was provided an opportunity to ask questions and all were answered. The patient agreed with the plan and demonstrated an understanding of the instructions.   The patient was advised to call back or seek an in-person evaluation if the symptoms worsen or if the condition fails to improve as anticipated.  I provided 30 minutes of non-face-to-face time during this encounter.   Carlis Abbott, RITA, M.Ed, CNA   Patient ID: Meghan [name], female   DOB: [birth year], 56 y.o.   MRN: [id number] D: Pt referred to [facility] per therapist [name], LCSW) treatment for worsening depressive an anxiety symptoms.  Pt denies SI/HI or A/V hallucinations.  Pt is well known to this case manager d/t previous admits in [place].  Cc: previous notes.  Stressors:  1) conflictual relationships with church members.  Apparently, they have been gossiping about pt.  2) Unresolved grief/loss issues:  a close friend and a family member. A:  Re-oriented pt. Pt was advised of ROI must be obtained prior to any records release in order to collaborate her care with an outside provider.  Pt was advised if she has not already done so to contact the front desk to sign all necessary forms in order for [facility] to release info re: her care. Consent:  Pt gives verbal consent for tx and assignment of benefits for services provided during this telehealth group process.  Pt expressed understanding and agreed to proceed. Collaboration of care:   Collaborate with Dr. Serina Cowper AEB, Dr. Armando Gang, and [name], [facility] .  Strongly encouraged support groups.   Pt will improve her mood as evidenced by being happy again, managing her mood and coping with daily stressors for 5 out of 7 days for 60 days.  R:  Pt receptive.   Dellia Nims, M.Ed,CNA

## 2022-07-10 NOTE — Progress Notes (Signed)
Virtual Visit via Video Note   I connected with Meghan [name] on 07/10/22 at  9:00 AM EDT by a video enabled telemedicine application and verified that I am speaking with the correct person using two identifiers.   At orientation to the IOP program, Case Manager discussed the limitations of evaluation and management by telemedicine and the availability of in person appointments. The patient expressed understanding and agreed to proceed with virtual visits throughout the duration of the program.   Location:  Patient: Patient Home Provider: Clinical Home Office   History of Present Illness: Major Depressive Disorder, recurrent, moderate, Generalized Anxiety Disorder and PTSD  Observations/Objective: Check In: Case Manager checked in with all participants to review discharge dates, insurance authorizations, work-related documents and needs from the treatment team regarding medications. Meghan [name] stated needs and engaged in discussion.    Initial Therapeutic Activity: Counselor facilitated a check-in with Meghan [name] to assess for safety, sobriety and medication compliance.  Counselor also inquired about Meghan [name]'s current emotional ratings, as well as any significant changes in thoughts, feelings or behavior since previous check in.  Meghan [name] presented for session on time and was alert, oriented x5, with no evidence or self-report of active SI/HI or A/V H.  Nehemie reported compliance with medication and denied use of alcohol or illicit substances.  Meghan [name] reported scores of 3/10 for depression, 3/10 for anxiety, and 10/10 for anger/irritability.  Meghan [name] denied any recent panic attacks.  Meghan [name] reported that a recent success was taking a walk in the park yesterday afternoon, and then having a long conversation with family.  Meghan [name] reported that a recent struggle was experiencing an outburst this morning towards her grandson, stating "I had to get on him about not doing his math homework".  Meghan [name] reported that her goal today  is to take time to rest and get some sleep since it has been irregular the past few days.       Second Therapeutic Activity: Counselor introduced [name], [place] Chaplain to provide psychoeducation on topic of Grief and Loss with members today.  Meghan [name] began discussion by checking in with the group about their baseline mood today, general thoughts on what grief means to them and how it has affected them personally in the past.  Meghan [name] provided information on how the process of grief/loss can differ depending upon one's unique culture, and categories of loss one could experience (i.e. loss of a person, animal, relationship, job, identity, etc).  Meghan [name] encouraged members to be mindful of how pervasive loss can be, and how to recognize signs which could indicate that this is having an impact on one's overall mental health and wellbeing.  Intervention was effective, as evidenced by Meghan [name] participating in discussion with speaker on the subject, reporting that when she was growing up, she experienced several losses, and this was followed by mixed messages from maternal and paternal sides of her family regarding how to handle her grief.  Meghan [name] stated "I started holding it in and when I got older, I still did it.  When I got down here they told me to let it out and cry.  When I went back to [place] this summer, I lost a close friend to cancer.  It was so quick it left me in a Meghan of shock".  She was receptive to feedback from chaplain on how to process losses in a healthy way to avoid excessive compartmentalization.    Third Therapeutic Activity: Counselor provided demonstration of relaxation technique known as mindful  breathing to help members increase sense of calm, resiliency, and control.  Counselor guided members through process of getting comfortable, achieving a relaxed breathing rhythm, and focusing on this for several minutes, allowing troubling thoughts and feelings to come and go without rumination.   Counselor processed effectiveness of activity afterward in discussion with members, including how this impacted their mental Meghan, whether it was difficult to stay focused, and if they plan to include it in self-care routine to improve day-to-day coping.  Intervention was effective, as evidenced by Meghan [name] participating in exercise, and reporting that she found it very relaxing, and would practice it again.    Assessment and Plan: Counselor recommends that Meghan [name] remain in IOP treatment to better manage mental health symptoms, ensure stability and pursue completion of treatment plan goals. Counselor recommends adherence to crisis/safety plan, taking medications as prescribed, and following up with medical professionals if any issues arise.   Follow Up Instructions: Counselor will send Webex link for next session. Meghan [name] was advised to call back or seek an in-person evaluation if the symptoms worsen or if the condition fails to improve as anticipated.   Collaboration of Care:   Medication Management AEB Dr. Armando Reichert or [name], NP                                          Case Manager AEB Dellia Nims, CNA    Patient/Guardian was advised Release of Information must be obtained prior to any record release in order to collaborate their care with an outside provider. Patient/Guardian was advised if they have not already done so to contact the registration department to sign all necessary forms in order for [name] to release information regarding their care.   Consent: Patient/Guardian gives verbal consent for treatment and assignment of benefits for services provided during this visit. Patient/Guardian expressed understanding and agreed to proceed.  I provided 180 minutes of non-face-to-face time during this encounter.   [name], LCSW, LCAS 07/10/22

## 2022-07-11 ENCOUNTER — Other Ambulatory Visit (HOSPITAL_COMMUNITY): Payer: Medicare Other | Admitting: Licensed Clinical Social Worker

## 2022-07-11 DIAGNOSIS — F411 Generalized anxiety disorder: Secondary | ICD-10-CM

## 2022-07-11 DIAGNOSIS — F331 Major depressive disorder, recurrent, moderate: Secondary | ICD-10-CM

## 2022-07-11 DIAGNOSIS — F4310 Post-traumatic stress disorder, unspecified: Secondary | ICD-10-CM

## 2022-07-11 NOTE — Progress Notes (Signed)
Virtual Visit via Video Note   I connected with Arly S. Appelbaum on 07/11/22 at  9:00 AM EDT by a video enabled telemedicine application and verified that I am speaking with the correct person using two identifiers.   At orientation to the IOP program, Case Manager discussed the limitations of evaluation and management by telemedicine and the availability of in person appointments. The patient expressed understanding and agreed to proceed with virtual visits throughout the duration of the program.   Location:  Patient: Patient Home Provider: [facility]   History of Present Illness: Major Depressive Disorder, recurrent, moderate, Generalized Anxiety Disorder and PTSD  Observations/Objective: Check In: Case Manager checked in with all participants to review discharge dates, insurance authorizations, work-related documents and needs from the treatment team regarding medications. Yuridia stated needs and engaged in discussion.    Initial Therapeutic Activity: Counselor facilitated a check-in with Gaylynn to assess for safety, sobriety and medication compliance.  Counselor also inquired about Rumi's current emotional ratings, as well as any significant changes in thoughts, feelings or behavior since previous check in.  Avarie presented for session on time and was alert, oriented x5, with no evidence or self-report of active SI/HI or A/V H.  Laurann reported compliance with medication and denied use of alcohol or illicit substances.  Lolamae reported scores of 0/10 for depression, 10/10 for anxiety, and 0/10 for anger/irritability.  Jatoya denied any recent outbursts or panic attacks.  Petrea reported that a recent success was attending choir rehearsal with her sister yesterday.  Lourdez reported that a recent struggle has been recovering from her foot surgery.  Paiten reported that her goal today is to attend a virtual awards ceremony for her grandson this afternoon.       Second Therapeutic Activity: Counselor  introduced topic of anger management today.  Counselor virtually shared a handout with members on this subject featuring a variety of coping skills, and facilitated discussion on these approaches.  Examples included raising awareness of anger triggers, practicing deep breathing, keeping an anger log to better understand episodes, using diversion activities to distract oneself for 30 minutes, taking a time out when necessary, and being mindful of warning signs tied to thoughts or behavior.  Counselor inquired about which techniques group members have used before, what has proved to be helpful, what their unique warning signs might be, as well as what they will try out in the future to assist with de-escalation.  Intervention was effective, as evidenced by [employer] participating in discussion on activity, and reporting that she has a pattern of holding in difficult emotions until she reaches her limits, and then expresses herself in the form of outbursts.  Ellyse reported that her triggers include family conflict, feeling unfairly judged, experiencing pain, and criticized.  Tarena reported that warning signs include stonewalling, and crying spells.  Letishia reported that she will work to manage anger more effectively by using coping skills such as taking time outs when emotions are high, meditation, and practicing positive visualizations.    Assessment and Plan: Counselor recommends that Jamerica remain in IOP treatment to better manage mental health symptoms, ensure stability and pursue completion of treatment plan goals. Counselor recommends adherence to crisis/safety plan, taking medications as prescribed, and following up with medical professionals if any issues arise.   Follow Up Instructions: Counselor will send Webex link for next session. Deeanna was advised to call back or seek an in-person evaluation if the symptoms worsen or if the condition fails to improve  as anticipated.   Collaboration of Care:   Medication  Management AEB Dr. Armando Reichert or [name], NP                                          Case Manager AEB Dellia Nims, CNA    Patient/Guardian was advised Release of Information must be obtained prior to any record release in order to collaborate their care with an outside provider. Patient/Guardian was advised if they have not already done so to contact the registration department to sign all necessary forms in order for [name] to release information regarding their care.   Consent: Patient/Guardian gives verbal consent for treatment and assignment of benefits for services provided during this visit. Patient/Guardian expressed understanding and agreed to proceed.  I provided 180 minutes of non-face-to-face time during this encounter.   [name], LCSW, LCAS 07/11/22

## 2022-07-12 ENCOUNTER — Other Ambulatory Visit (HOSPITAL_COMMUNITY): Payer: Medicare Other | Admitting: Licensed Clinical Social Worker

## 2022-07-12 DIAGNOSIS — F331 Major depressive disorder, recurrent, moderate: Secondary | ICD-10-CM

## 2022-07-12 DIAGNOSIS — F4310 Post-traumatic stress disorder, unspecified: Secondary | ICD-10-CM

## 2022-07-12 DIAGNOSIS — F411 Generalized anxiety disorder: Secondary | ICD-10-CM

## 2022-07-12 NOTE — Progress Notes (Signed)
Virtual Visit via Video Note   I connected with Meghan [name] on 07/12/22 at  9:00 AM EDT by a video enabled telemedicine application and verified that I am speaking with the correct person using two identifiers.   At orientation to the IOP program, Case Manager discussed the limitations of evaluation and management by telemedicine and the availability of in person appointments. The patient expressed understanding and agreed to proceed with virtual visits throughout the duration of the program.   Location:  Patient: Patient Home Provider: [facility]   History of Present Illness: Major Depressive Disorder, recurrent, moderate, Generalized Anxiety Disorder and PTSD  Observations/Objective: Check In: Case Manager checked in with all participants to review discharge dates, insurance authorizations, work-related documents and needs from the treatment team regarding medications. Meghan [name] stated needs and engaged in discussion.    Initial Therapeutic Activity: Counselor facilitated a check-in with Meghan [name] to assess for safety, sobriety and medication compliance.  Counselor also inquired about Meghan [name]'s current emotional ratings, as well as any significant changes in thoughts, feelings or behavior since previous check in.  Meghan [name] presented for session on time and was alert, oriented x5, with no evidence or self-report of active SI/HI or A/V H.  Meghan [name] reported compliance with medication and denied use of alcohol or illicit substances.  Meghan [name] reported scores of 0/10 for depression, 0/10 for anxiety, and 10/10 for irritability.  Meghan [name] denied any recent or panic attacks.  Meghan [name] reported that a recent struggle was having an outburst towards her cousin, who can be very disrespectful towards Meghan [name] and her sister.  Meghan [name] reported that she has also been sleeping poorly, which has affected her mood and patience.  Meghan [name] reported that her goal today is to stay inside and do some easter play planning for her church since it  will be too warm for her outside.     Second Therapeutic Activity: Counselor introduced topic of 'urge surfing' today.  Counselor explained how this technique can be used to avoid acting upon a behavior that needs to be reduced or stopped completely.  Counselor provided common examples of maladaptive behaviors, such as smoking, overeating, substance use, excessive spending, lashing out emotionally, and more.  Counselor explained how urges rarely last more than 30 minutes if they are not ruminated upon, and attempts to fight or suppress the urge can ultimately cause the problem to grow.  Counselor guided members through practice of combination mindfulness, relaxation, and visualization exercises in order to "surf" an urge of their concern until it faded.  Intervention was effective, as evidenced by Meghan [name] successfully participating in urge surfing activity and reporting that when people upset her enough, she can get the urge to become physical with them or lash out verbally.  She reported that she felt calmer after this exercise, and stated "It made me realize that I could have done deep breathing yesterday and avoided speaking as loudly or angry as I did.  I could have approached it differently".    Assessment and Plan: Counselor recommends that Meghan [name] remain in IOP treatment to better manage mental health symptoms, ensure stability and pursue completion of treatment plan goals. Counselor recommends adherence to crisis/safety plan, taking medications as prescribed, and following up with medical professionals if any issues arise.   Follow Up Instructions: Counselor will send Webex link for next session. Meghan [name] was advised to call back or seek an in-person evaluation if the symptoms worsen or if the condition fails to improve as anticipated.   Collaboration of  Care:   Medication Management AEB Dr. Armando Reichert or [name], NP                                          Case Manager AEB Dellia Nims, CNA     Patient/Guardian was advised Release of Information must be obtained prior to any record release in order to collaborate their care with an outside provider. Patient/Guardian was advised if they have not already done so to contact the registration department to sign all necessary forms in order for [name] to release information regarding their care.   Consent: Patient/Guardian gives verbal consent for treatment and assignment of benefits for services provided during this visit. Patient/Guardian expressed understanding and agreed to proceed.  I provided 180 minutes of non-face-to-face time during this encounter.   [name], LCSW, LCAS 07/12/22

## 2022-07-13 ENCOUNTER — Other Ambulatory Visit (HOSPITAL_COMMUNITY): Payer: Medicare Other | Admitting: Licensed Clinical Social Worker

## 2022-07-13 DIAGNOSIS — F4310 Post-traumatic stress disorder, unspecified: Secondary | ICD-10-CM

## 2022-07-13 DIAGNOSIS — F331 Major depressive disorder, recurrent, moderate: Secondary | ICD-10-CM | POA: Diagnosis not present

## 2022-07-13 DIAGNOSIS — F411 Generalized anxiety disorder: Secondary | ICD-10-CM

## 2022-07-13 NOTE — Progress Notes (Addendum)
Virtual Visit via Video Note   I connected with Felesha S. Toren on 07/13/22 at  9:00 AM EDT by a video enabled telemedicine application and verified that I am speaking with the correct person using two identifiers.   At orientation to the IOP program, Case Manager discussed the limitations of evaluation and management by telemedicine and the availability of in person appointments. The patient expressed understanding and agreed to proceed with virtual visits throughout the duration of the program.   Location:  Patient: Patient Home Provider: [facility]   History of Present Illness: Major Depressive Disorder, recurrent, moderate, Generalized Anxiety Disorder and PTSD  Observations/Objective: Check In: Case Manager checked in with all participants to review discharge dates, insurance authorizations, work-related documents and needs from the treatment team regarding medications. Amberlee stated needs and engaged in discussion.    Initial Therapeutic Activity: Counselor facilitated a check-in with Kelly to assess for safety, sobriety and medication compliance.  Counselor also inquired about Tyianna's current emotional ratings, as well as any significant changes in thoughts, feelings or behavior since previous check in.  Curley presented for session on time and was alert, oriented x5, with no evidence or self-report of active SI/HI or A/V H.  Tarrin reported compliance with medication and denied use of alcohol or illicit substances.  Tykerria reported scores of 10/10 for depression, 10/10 for anxiety, and 10/10 for anger/irritability.  Shastina denied any recent outbursts, but disclosed that she did have a panic attack this morning.  [name] reported that a recent success was getting a good nights sleep last night.  Domanique reported that a recent struggle was getting an early morning call from her son this morning, and he disclosed that he was having a crisis due to relationship trouble.  She also reported that she  received news that she will need heart surgery, which made her very anxious.  She was receptive to supportive feedback from peers and counselor on how to cope with recent challenges.  Tavon reported that her goal this weekend is to get outside the home to process some laundry with her sister.         Second Therapeutic Activity: Counselor introduced topic of building a social support network today.  Counselor explained how this can be defined as having a having a group of healthy people in one's life you can talk to, spend time with, and get help from to improve both mental and physical health.  Counselor noted that some barriers can make it difficult to connect with other people, including the presence of anxiety or depression, or moving to an unfamiliar area.  Group members were asked to assess the current state of their support network, and identify ways that this could be improved.  Tips were given on how to address previously noted barriers, such as strengthening social skills, using relaxation techniques to reduce anxiety, scheduling social time each week, and/or exploring social events nearby which could increase chances of meeting new supports.  Members were also encouraged to consider getting closer to people they already know through suggestions such as outreaching someone by text, email or phone call if they haven't spoken in awhile, doing something nice for a friend/family member unexpectedly, and/or inviting someone over for a game/movie/dinner night.  Intervention was effective, as evidenced by Joylynn actively participating in discussion on the subject, and reporting that she has a robust support network composed of family, church members, and [employer] operational [employer], but lately she has been overwhelmed with stress, and has  the urge to isolate, which has led to crisis in the past.  Kaydie reported that being able to share about struggles today in group reinforced the importance of staying connected to  positive support.  She reported that her goal will be to stay active with social outlets, including those for therapy like [employer] and [place].    Assessment and Plan: Counselor recommends that Jannet remain in IOP treatment to better manage mental health symptoms, ensure stability and pursue completion of treatment plan goals. Counselor recommends adherence to crisis/safety plan, taking medications as prescribed, and following up with medical professionals if any issues arise.   Follow Up Instructions: Counselor will send Webex link for next session. Eurika was advised to call back or seek an in-person evaluation if the symptoms worsen or if the condition fails to improve as anticipated.   Collaboration of Care:   Medication Management AEB Dr. Armando Reichert or [name], NP                                          Case Manager AEB Dellia Nims, CNA    Patient/Guardian was advised Release of Information must be obtained prior to any record release in order to collaborate their care with an outside provider. Patient/Guardian was advised if they have not already done so to contact the registration department to sign all necessary forms in order for [name] to release information regarding their care.   Consent: Patient/Guardian gives verbal consent for treatment and assignment of benefits for services provided during this visit. Patient/Guardian expressed understanding and agreed to proceed.  I provided 180 minutes of non-face-to-face time during this encounter.   [name], LCSW, LCAS 07/13/22

## 2022-07-16 ENCOUNTER — Other Ambulatory Visit (HOSPITAL_COMMUNITY): Payer: Medicare Other | Admitting: Psychiatry

## 2022-07-16 DIAGNOSIS — F4310 Post-traumatic stress disorder, unspecified: Secondary | ICD-10-CM

## 2022-07-16 DIAGNOSIS — F331 Major depressive disorder, recurrent, moderate: Secondary | ICD-10-CM

## 2022-07-16 DIAGNOSIS — F411 Generalized anxiety disorder: Secondary | ICD-10-CM
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
## 2022-07-17 ENCOUNTER — Other Ambulatory Visit (HOSPITAL_COMMUNITY): Payer: Medicare Other | Admitting: Psychiatry

## 2022-07-17 DIAGNOSIS — F411 Generalized anxiety disorder: Secondary | ICD-10-CM

## 2022-07-17 DIAGNOSIS — F331 Major depressive disorder, recurrent, moderate: Secondary | ICD-10-CM | POA: Diagnosis not present

## 2022-07-17 DIAGNOSIS — F4310 Post-traumatic stress disorder, unspecified: Secondary | ICD-10-CM
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
## 2022-07-18 ENCOUNTER — Other Ambulatory Visit (HOSPITAL_COMMUNITY): Payer: Medicare Other | Admitting: Licensed Clinical Social Worker

## 2022-07-18 ENCOUNTER — Other Ambulatory Visit (HOSPITAL_COMMUNITY): Payer: Medicare Other | Attending: Licensed Clinical Social Worker | Admitting: Licensed Clinical Social Worker

## 2022-07-18 DIAGNOSIS — F331 Major depressive disorder, recurrent, moderate: Secondary | ICD-10-CM | POA: Diagnosis present

## 2022-07-18 DIAGNOSIS — F411 Generalized anxiety disorder: Secondary | ICD-10-CM | POA: Diagnosis not present

## 2022-07-18 DIAGNOSIS — F4310 Post-traumatic stress disorder, unspecified: Secondary | ICD-10-CM | POA: Diagnosis not present

## 2022-07-18 NOTE — Progress Notes (Signed)
Virtual Visit via Video Note   I connected with Louisa S. Sur on 07/18/22 at  9:00 AM EDT by a video enabled telemedicine application and verified that I am speaking with the correct person using two identifiers.   At orientation to the IOP program, Case Manager discussed the limitations of evaluation and management by telemedicine and the availability of in person appointments. The patient expressed understanding and agreed to proceed with virtual visits throughout the duration of the program.   Location:  Patient: Patient Home Provider: [facility] [place] [facility]   History of Present Illness: Major Depressive Disorder, recurrent, moderate, Generalized Anxiety Disorder and PTSD  Observations/Objective: Check In: Case Manager checked in with all participants to review discharge dates, insurance authorizations, work-related documents and needs from the treatment team regarding medications. Areesha stated needs and engaged in discussion.    Initial Therapeutic Activity: Counselor facilitated a check-in with Lesbia to assess for safety, sobriety and medication compliance.  Counselor also inquired about Samona's current emotional ratings, as well as any significant changes in thoughts, feelings or behavior since previous check in.  Domanique presented for session on time and was alert, oriented x5, with no evidence or self-report of active SI/HI or A/V H.  Caralyn reported compliance with medication and denied use of alcohol or illicit substances.  Gyanna reported scores of 0/10 for depression, 10/10 for anxiety, and 5/10 for anger/irritability.  Asja denied any recent panic attacks.  Jaylene reported that a recent struggle was having an outburst this morning towards her son, stating "He has never disrespected me or talked back, but this morning he only sent part of the phone bill and made some excuses".  Kayliah reported that a success was talking this out, and having her son apologize later.  Zahraa reported that her goal today is  to get out of her apartment to do some grocery shopping, pay her bills, and enjoy the cooler weather.       Second Therapeutic Activity: Counselor introduced Einar Grad, [employer] Pharmacist, to provide psychoeducation on topic of medication compliance with members today.  Jiles Garter provided psychoeducation on classes of medications such as antidepressants, antipsychotics, what symptoms they are intended to treat, and any side effects one might encounter while on a particular prescription.  Time was allowed for clients to ask any questions they might have of [facility] regarding this specialty.  Intervention was effective, as evidenced by [employer] participating in discussion with speaker on the subject, reporting that she has been suffering from poor sleep for some time now, and even participated in a sleep study, which did not make a difference.  Kewanna stated "I tried a few different things like melatonin and nothing worked.  My doctor prescribed hydroxyzine, and that does nothing either".  Madasyn was receptive to feedback from pharmacist on additional medications that might benefit her, as well as general sleep hygiene changes she could implement.    Assessment and Plan: Counselor recommends that Krissy remain in IOP treatment to better manage mental health symptoms, ensure stability and pursue completion of treatment plan goals. Counselor recommends adherence to crisis/safety plan, taking medications as prescribed, and following up with medical professionals if any issues arise.   Follow Up Instructions: Counselor will send Webex link for next session. Santosha was advised to call back or seek an in-person evaluation if the symptoms worsen or if the condition fails to improve as anticipated.   Collaboration of Care:   Medication Management AEB Dr. Fatima Sanger or [name], NP  Case Manager AEB Dellia Nims, CNA   Patient/Guardian was advised Release of Information must be  obtained prior to any record release in order to collaborate their care with an outside provider. Patient/Guardian was advised if they have not already done so to contact the registration department to sign all necessary forms in order for [name] to release information regarding their care.   Consent: Patient/Guardian gives verbal consent for treatment and assignment of benefits for services provided during this visit. Patient/Guardian expressed understanding and agreed to proceed.  I provided 180 minutes of non-face-to-face time during this encounter.   [name], [place], LCAS 07/18/22

## 2022-07-19 ENCOUNTER — Telehealth (HOSPITAL_COMMUNITY): Payer: Self-pay | Admitting: Psychiatry

## 2022-07-19 ENCOUNTER — Ambulatory Visit (HOSPITAL_COMMUNITY): Payer: Medicare Other | Admitting: Psychiatry

## 2022-07-19 ENCOUNTER — Ambulatory Visit: Payer: Medicare Other | Attending: Internal Medicine | Admitting: Internal Medicine

## 2022-07-19 VITALS — BP 128/74 | HR 87 | Ht 66.0 in | Wt 300.0 lb

## 2022-07-19 DIAGNOSIS — I4892 Unspecified atrial flutter: Secondary | ICD-10-CM

## 2022-07-19 MED ORDER — METOPROLOL SUCCINATE ER 25 MG PO TB24: 25.0000 mg | ORAL_TABLET | Freq: Every day | ORAL | 3 refills | Status: DC

## 2022-07-19 NOTE — Patient Instructions (Signed)
Medication Instructions:  Your physician has recommended you make the following change in your medication:  START: Metoprolol XL '25mg'$  daily  Please DO NOT TAKE Rybelsus the day before or the day of your procedure.  *If you need a refill on your cardiac medications before your next appointment, please call your pharmacy*   Lab Work: Your physician recommends that you have the following labs drawn [place] 07/27/2022 : BMET and CBC  If you have labs (blood work) drawn today and your tests are completely normal, you will receive your results only by: [place] (if you have MyChart) OR A paper copy in the mail If you have any lab test that is abnormal or we need to change your treatment, we will call you to review the results.   Testing/Procedures: Your physician has requested that you have a Cardioversion. Electrical Cardioversion uses a jolt of electricity to your heart either through paddles or wired patches attached to your chest. This is a controlled, usually prescheduled, procedure. This procedure is done at the hospital and you are not awake during the procedure. You usually go home the day of the procedure. Please see the instruction sheet given to you today for more information.  Your physician has requested that you have an echocardiogram. Echocardiography is a painless test that uses sound waves to create images of your heart. It provides your doctor with information about the size and shape of your heart and how well your heart's chambers and valves are working. This procedure takes approximately one hour. There are no restrictions for this procedure. Please do NOT wear cologne, perfume, aftershave, or lotions (deodorant is allowed). Please arrive 15 minutes prior to your appointment time.     Follow-Up: At [facility], you and your health needs are our priority.  As part of our continuing mission to provide you with exceptional heart care, we have created designated  Provider Care Teams.  These Care Teams include your primary Cardiologist (physician) and Advanced Practice Providers (APPs -  Physician Assistants and Nurse Practitioners) who all work together to provide you with the care you need, when you need it.  We recommend signing up for the patient portal called "MyChart".  Sign up information is provided on this After Visit Summary.  MyChart is used to connect with patients for Virtual Visits (Telemedicine).  Patients are able to view lab/test results, encounter notes, upcoming appointments, etc.  Non-urgent messages can be sent to your provider as well.   To learn more about what you can do with MyChart, go to [email or web address].    Your next appointment:   3 month(s)  The format for your next appointment:   In Person  Provider:   Janina Mayo, MD     Other Instructions    Dear [name]  You are scheduled for a Cardioversion on Tuesday, November 14 with Dr. Berniece Salines, DO.  Please arrive at the [facility] (Main Entrance A) at [facility]: [address] [place], [place] [address] at 7:30 AM.   DIET:  Nothing to eat or drink after midnight except a sip of water with medications (see medication instructions below)  MEDICATION INSTRUCTIONS: HOLD: Semaglutide (Ozempic, Rybelsus, Wegovy) for 1 day before procedure.  Continue taking your anticoagulant (blood thinner): Rivaroxaban (Xarelto).  You will need to continue this after your procedure until you are told by your provider that it is safe to stop.     FYI:  For your safety, and to  allow [name] to monitor your vital signs accurately during the surgery/procedure we request: If you have artificial nails, gel coating, SNS etc, please have those removed prior to your surgery/procedure. Not having the nail coverings /polish removed may result in cancellation or delay of your surgery/procedure.  You must have a responsible person to drive you home and stay in the waiting area  during your procedure. Failure to do so could result in cancellation.  Bring your insurance cards.  *Special Note: Every effort is made to have your procedure done on time. Occasionally there are emergencies that occur at the hospital that may cause delays. Please be patient if a delay does occur.

## 2022-07-19 NOTE — Progress Notes (Signed)
Cardiology Office Note:    Date:  07/19/2022   ID:  Meghan [name], DOB 10/20/1965, MRN [id number]  PCP:  Beverley Fiedler, [place] Prov[facility] Cardiologist:  Janina Mayo, MD     Referring MD: Beverley Fiedler, *   Chief Complaint  Patient presents with   New Patient (Initial Visit)  Atrial Flutter  History of Present Illness:    Meghan [name] is a 56 y.o. female with a hx of T2DM, OSA on CPAP, hx of PE in 1993 (was on warfarin prior, no xarelto) s/p IVC filter (has had multiple PEs despite AC therefore had filter placed per patient), ? HFpEF (EF unknown), stroke, HLD, atrial fibrillation on xarelto, ECG resolution is not optimal. Was followed by Dr. Shirlee More at [place] in 2020. She was managed on flecainide and ? cardizem.  Had an echo 2020, can't see the results. She was referred from her PCP at [facility] for afib.  Found to be 2:1 atrial flutter.  She has hx of heart failure per patient in [place]. , she was on lasix. She had 1 hospitalization for this at [facility]. She had IV lasix. No hx of CAD. She notes palpitations. She feels more fatigued. She sleeps on 3 pillows. No PND. No LE edema. Good blood pressure. She sees Dr. Edson Snowball at [facility].  Past Medical History:  Diagnosis Date   Anxiety    Arthritis    left knee   Asthma    Atrial fibrillation ([place]) 02/16/2019   CHF (congestive heart failure) ([place]) 01/2019   Physician from [place] informed her she had this    Diabetes mellitus without complication ([place])    Dysrhythmia    Atrial fibrillation   GERD (gastroesophageal reflux disease)    Hiatal hernia    History of pulmonary embolus (PE)    Hypercholesterolemia    Macular degeneration    of left eye   MDD (major depressive disorder)    Osteoarthritis    knees   Pneumonia    in the past   PTSD (post-traumatic stress disorder)    Pulmonary embolism ([place])    bilateral, none since 2003   Seizure disorder ([place])    Per patient last seizure was  in 2020.   Sleep apnea CPAP   [facility]  Uses CPAP   Stroke ([place])    1989 and 1995 (left sided weakness)   Vitamin D deficiency     Past Surgical History:  Procedure Laterality Date   ABDOMINAL HYSTERECTOMY  2003   complete   CARDIAC CATHETERIZATION  2017   in [facility] in [place]  [id number]   x 2   CHOLECYSTECTOMY  2003   COLONOSCOPY WITH PROPOFOL N/A 06/05/2019   Procedure: COLONOSCOPY WITH PROPOFOL;  Surgeon: Carol Ada, MD;  Location: [facility];  Service: Endoscopy;  Laterality: N/A;   CYST REMOVAL WITH BONE GRAFT Left 10/18/2020   Procedure: BONE GRAFTING OF ENCHONDROMA MIDDLE [place] OF LEFT MIDDLE FINGER;  Surgeon: Daryll Brod, MD;  Location: [place];  Service: Orthopedics;  Laterality: Left;  AXILLARY BLOCK   DIAGNOSTIC LAPAROSCOPY  2015; 2017   lap hernia repair x2   endoscopy     ENDOVENOUS ABLATION SAPHENOUS VEIN W/ LASER Left 08/31/2021   endovenous laser ablation left greater saphenous vein and stab phlebectomy 10-20 incisions left leg by Servando Snare MD   [place] Right 05/28/2022   Procedure: HALLUX FUSION METATARSAL PHALANGEAL JOIT;  Surgeon:  [name], DPM;  Location: [facility];  Service: Podiatry;  Laterality: Right;  BLOCK   HERNIA REPAIR  [id number], [id number]   umbilical hernia repair   IVC FILTER INSERTION  2003   Hx pulmonary embolus   POLYPECTOMY  06/05/2019   Procedure: POLYPECTOMY;  Surgeon: Carol Ada, MD;  Location: [facility];  Service: Endoscopy;;   SHOULDER ARTHROSCOPY W/ ROTATOR CUFF REPAIR  08/28/2017   right shoulder   WRIST ARTHROSCOPY WITH DEBRIDEMENT Left 10/18/2020   Procedure: LEFT WRIST ARTHROSCOPY WITH DEBRIDEMENT;  Surgeon: Daryll Brod, MD;  Location: [place];  Service: Orthopedics;  Laterality: Left;  AXILLARY BLOCK   WRIST ARTHROSCOPY WITH DEBRIDEMENT Right 01/25/2022   Procedure: ARTHROSCOPY RIGHT WRIST WITH  DEBRIDEMENT/ SHRINKAGE;  Surgeon: Leanora Cover, MD;  Location: [place];   Service: Orthopedics;  Laterality: Right;    Current Medications: Current Meds  Medication Sig   acetaminophen (TYLENOL) 500 MG tablet Take 1,000 mg by mouth every 6 (six) hours as needed for moderate pain.   albuterol (VENTOLIN HFA) 108 (90 Base) MCG/ACT inhaler Inhale 2 puffs into the lungs every 6 (six) hours as needed for wheezing or shortness of breath.   atorvastatin (LIPITOR) 40 MG tablet Take 40 mg by mouth every evening.   BREZTRI AEROSPHERE 160-9-4.8 MCG/ACT AERO Inhale 2 puffs into the lungs in the morning and at bedtime.    bumetanide (BUMEX) 1 MG tablet Take 1 mg by mouth daily.   Cholecalciferol (VITAMIN D-3) 25 MCG (1000 UT) CAPS Take 1,000 Units by mouth daily.   flecainide (TAMBOCOR) 50 MG tablet Take 50 mg by mouth 2 (two) times daily.    hydrOXYzine (ATARAX) 25 MG tablet Take 1-2 tablets at bedtime for insomnia   levETIRAcetam (KEPPRA) 500 MG tablet Take 1 tablet (500 mg total) by mouth 2 (two) times daily.   metFORMIN (GLUCOPHAGE) 500 MG tablet Take 500 mg by mouth daily with breakfast.   metoprolol succinate (TOPROL-XL) 25 MG 24 hr tablet Take 1 tablet (25 mg total) by mouth daily. Take with or immediately following a meal.   Omega-3 Fatty Acids (FISH OIL) 1000 MG CAPS Take 1,000 mg by mouth daily with breakfast.    omeprazole (PRILOSEC) 40 MG capsule Take 40 mg by mouth daily.   rivaroxaban (XARELTO) 20 MG TABS tablet Take 20 mg by mouth at bedtime.    RYBELSUS 14 MG TABS Take 14 mg by mouth daily.   sertraline (ZOLOFT) 50 MG tablet Take 50 mg by mouth daily.     Allergies:   Acetaminophen-codeine, Bee venom, Meloxicam, Tramadol, Codeine, Coconut (cocos nucifera), and Tomato   Social History   Socioeconomic History   Marital status: Divorced    Spouse name: Not on file   Number of children: 3   Years of education: Not on file   Highest education level: Not on file  Occupational History   Not on file  Tobacco Use   Smoking status: Never   Smokeless tobacco:  Never  Vaping Use   Vaping Use: Never used  Substance and Sexual Activity   Alcohol use: Never   Drug use: Never   Sexual activity: Not on file    Comment: Hysterectomy  Other Topics Concern   Not on file  Social History Narrative   ** Merged History Encounter **       Social Determinants of Health   Financial Resource Strain: Not on file  Food Insecurity: Not on file  Transportation Needs: Not on file  Physical Activity: Not on file  Stress: Not on file  Social Connections: Not on file     Family History: The patient's family history includes Breast cancer in her maternal grandmother and mother; Congestive Heart Failure in her father; Diabetes in her father; Heart attack in her father.  ROS:   Please see the history of present illness.     All other systems reviewed and are negative.  EKGs/Labs/Other Studies Reviewed:    The following studies were reviewed today:   EKG:  EKG is  ordered today.  The ekg ordered today demonstrates   07/19/2022- atrial flutter with variable AV block  Recent Labs: 11/27/2021: ALT 14 01/01/2022: Platelets 235 05/28/2022: BUN 22; Creatinine, Ser 0.80; Hemoglobin 11.9; Potassium 4.5; Sodium 140  Recent Lipid Panel No results found for: "CHOL", "TRIG", "HDL", "CHOLHDL", "VLDL", "LDLCALC", "LDLDIRECT"   Risk Assessment/Calculations:    CHA2DS2-VASc Score = 5   This indicates a 7.2% annual risk of stroke. The patient's score is based upon: CHF History: 1 HTN History: 0 Diabetes History: 1 Stroke History: 2 Vascular Disease History: 0 Age Score: 0 Gender Score: 1       Physical Exam:    VS:  BP 128/74 (BP Location: Left Arm, Patient Position: Sitting, Cuff Size: Large)   Pulse 87   Ht '5\' 6"'$  (1.676 m)   Wt 300 lb (136.1 kg)   SpO2 99%   BMI 48.42 kg/m     Wt Readings from Last 3 Encounters:  07/19/22 300 lb (136.1 kg)  07/02/22 (!) 307 lb (139.3 kg)  06/14/22 288 lb (130.6 kg)     GEN:  Well nourished, well developed  in no acute distress HEENT: Normal NECK: No JVD; No carotid bruits LYMPHATICS: No lymphadenopathy CARDIAC: RRR, no murmurs, rubs, gallops RESPIRATORY:  Clear to auscultation without rales, wheezing or rhonchi  ABDOMEN: Soft, non-tender, non-distended MUSCULOSKELETAL:  No edema; No deformity  SKIN: Warm and dry NEUROLOGIC:  Alert and oriented x 3 PSYCHIATRIC:  Normal affect   ASSESSMENT:    Typical Atrial Flutter/Paroxysmal Atrial Fibrillation: Diagnosed after a stroke several years ago. Has flutter with variable block. She is mildly symptomatic with fatigue. Will plan for DCCV. Will send a referral to EP for consideration of CTI. She's been on flecainide at least since 2021. Her weight might preclude her, we discussed this.  - DCCV [orders placed] -continue xarelto 20 mg daily [uninterrupted] -continue flecainide 50 mg BID  -no cardizem on her med list despite in her records -start metoprolol 25 mg XL  HFpEF?: Unknown EF. Euvolemic today. Dry weight ~136 kg - TTE today - continue bumex 1 mg daily - discuss SGLT2 on next visit  Dm2: A1c goal <7. ON GLP1.  Will discuss SGLT2 on next visit.   HLD: lipitor 40 mg daily PLAN:    In order of problems listed above:  DCCV EP referral to consider CTI Start metoprolol 25 mg XL TTE      Shared Decision Making/Informed Consent The risks (stroke, cardiac arrhythmias rarely resulting in the need for a temporary or permanent pacemaker, skin irritation or burns and complications associated with conscious sedation including aspiration, arrhythmia, respiratory failure and death), benefits (restoration of normal sinus rhythm) and alternatives of a direct current cardioversion were explained in detail to Ms. Hanner and she agrees to proceed.      Medication Adjustments/Labs and Tests Ordered: Current medicines are reviewed at length with the patient today.  Concerns regarding medicines are outlined above.  Orders  Placed This Encounter   Procedures   Basic Metabolic Panel (BMET)   CBC   EKG 12-Lead   ECHOCARDIOGRAM COMPLETE   Meds ordered this encounter  Medications   metoprolol succinate (TOPROL-XL) 25 MG 24 hr tablet    Sig: Take 1 tablet (25 mg total) by mouth daily. Take with or immediately following a meal.    Dispense:  90 tablet    Refill:  3    Patient Instructions  Medication Instructions:  Your physician has recommended you make the following change in your medication:  START: Metoprolol XL '25mg'$  daily  Please DO NOT TAKE Rybelsus the day before or the day of your procedure.  *If you need a refill on your cardiac medications before your next appointment, please call your pharmacy*   Lab Work: Your physician recommends that you have the following labs drawn [place] 07/27/2022 : BMET and CBC  If you have labs (blood work) drawn today and your tests are completely normal, you will receive your results only by: [place] (if you have MyChart) OR A paper copy in the mail If you have any lab test that is abnormal or we need to change your treatment, we will call you to review the results.   Testing/Procedures: Your physician has requested that you have a Cardioversion. Electrical Cardioversion uses a jolt of electricity to your heart either through paddles or wired patches attached to your chest. This is a controlled, usually prescheduled, procedure. This procedure is done at the hospital and you are not awake during the procedure. You usually go home the day of the procedure. Please see the instruction sheet given to you today for more information.  Your physician has requested that you have an echocardiogram. Echocardiography is a painless test that uses sound waves to create images of your heart. It provides your doctor with information about the size and shape of your heart and how well your heart's chambers and valves are working. This procedure takes approximately one hour. There are no restrictions  for this procedure. Please do NOT wear cologne, perfume, aftershave, or lotions (deodorant is allowed). Please arrive 15 minutes prior to your appointment time.     Follow-Up: At [facility], you and your health needs are our priority.  As part of our continuing mission to provide you with exceptional heart care, we have created designated Provider Care Teams.  These Care Teams include your primary Cardiologist (physician) and Advanced Practice Providers (APPs -  Physician Assistants and Nurse Practitioners) who all work together to provide you with the care you need, when you need it.  We recommend signing up for the patient portal called "MyChart".  Sign up information is provided on this After Visit Summary.  MyChart is used to connect with patients for Virtual Visits (Telemedicine).  Patients are able to view lab/test results, encounter notes, upcoming appointments, etc.  Non-urgent messages can be sent to your provider as well.   To learn more about what you can do with MyChart, go to [email or web address].    Your next appointment:   3 month(s)  The format for your next appointment:   In Person  Provider:   Janina Mayo, MD     Other Instructions    Dear Meghan [name]  You are scheduled for a Cardioversion on Tuesday, November 14 with Dr. Berniece Salines, DO.  Please arrive at the [facility] (Main Entrance A) at [facility]: [address] [place], [place] [address] at  7:30 AM.   DIET:  Nothing to eat or drink after midnight except a sip of water with medications (see medication instructions below)  MEDICATION INSTRUCTIONS: HOLD: Semaglutide (Ozempic, Rybelsus, Wegovy) for 1 day before procedure.  Continue taking your anticoagulant (blood thinner): Rivaroxaban (Xarelto).  You will need to continue this after your procedure until you are told by your provider that it is safe to stop.     FYI:  For your safety, and to allow [name] to monitor your vital signs  accurately during the surgery/procedure we request: If you have artificial nails, gel coating, SNS etc, please have those removed prior to your surgery/procedure. Not having the nail coverings /polish removed may result in cancellation or delay of your surgery/procedure.  You must have a responsible person to drive you home and stay in the waiting area during your procedure. Failure to do so could result in cancellation.  Bring your insurance cards.  *Special Note: Every effort is made to have your procedure done on time. Occasionally there are emergencies that occur at the hospital that may cause delays. Please be patient if a delay does occur.       Signed, Janina Mayo, MD  07/19/2022 11:38 AM    [place]

## 2022-07-20 ENCOUNTER — Other Ambulatory Visit (HOSPITAL_COMMUNITY): Payer: Medicare Other | Attending: Licensed Clinical Social Worker | Admitting: Licensed Clinical Social Worker

## 2022-07-20 DIAGNOSIS — F411 Generalized anxiety disorder: Secondary | ICD-10-CM | POA: Insufficient documentation

## 2022-07-20 DIAGNOSIS — F331 Major depressive disorder, recurrent, moderate: Secondary | ICD-10-CM | POA: Insufficient documentation

## 2022-07-20 DIAGNOSIS — F4310 Post-traumatic stress disorder, unspecified: Secondary | ICD-10-CM | POA: Insufficient documentation

## 2022-07-20 NOTE — Progress Notes (Signed)
Virtual Visit via Video Note   I connected with [name] S. Heritage on 07/20/22 at  9:00 AM EDT by a video enabled telemedicine application and verified that I am speaking with the correct person using two identifiers.   At orientation to the IOP program, Case Manager discussed the limitations of evaluation and management by telemedicine and the availability of in person appointments. The patient expressed understanding and agreed to proceed with virtual visits throughout the duration of the program.   Location:  Patient: Patient Home Provider: Clinical Home Office   History of Present Illness: Major Depressive Disorder, recurrent, moderate, Generalized Anxiety Disorder and PTSD  Observations/Objective: Check In: Case Manager checked in with all participants to review discharge dates, insurance authorizations, work-related documents and needs from the treatment team regarding medications. Tangy stated needs and engaged in discussion.    Initial Therapeutic Activity: Counselor facilitated a check-in with Gunhild to assess for safety, sobriety and medication compliance.  Counselor also inquired about Moani's current emotional ratings, as well as any significant changes in thoughts, feelings or behavior since previous check in.  Raechelle presented for session on time and was alert, oriented x5, with no evidence or self-report of active SI/HI or A/V H.  Adajah reported compliance with medication and denied use of alcohol or illicit substances.  Avaiyah reported scores of 0/10 for depression, 10/10 for anxiety, and 10/10 for anger/irritability.  Onyx reported that she had 2 doctor's appointments yesterday and experienced an outburst and panic attack at one of the offices.  Dorthey reported that this started when she had trouble finding the office, was feeling fatigued and frustrated, and then had to wait longer than expected because staff forgot to inform the doctor she had arrived.  [name] reported that her blood  pressure was high, and when she finally saw the doctor, it was a very brief meeting, and she learned that she will need 3 procedures done in the next month.  Neysa reported that a success was attending her online support group in the evening to vent about her day with relatable peers.  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        Second Therapeutic Activity: Counselor invited members to participate in peaceful place guided imagery activity today.  Counselor explained how this is a powerful visualization tool which can aid in reducing stress while increasing sense of calm, control, and awareness if practiced regularly.  Counselor informed members beforehand that if they became uncomfortable at any point during activity, they could stop and open their eyes.  Counselor invited members to get comfortable, achieve a relaxing breathing rhythm, close their eyes, and then guided them through process of creating a 'peaceful place' which filled them with safety and calm.  Counselor encouraged members to include sensory details involving vision, sound, touch, smell, and taste which they considered pleasant to enhance experience.  After 10 minutes of practice in session, counselor invited members to share their opinion on the activity, including whether they were able to imagine a specific place, what details stood out to them, and how this made them feel during and after.  Intervention was effective, as evidenced by Trace successfully participating in activity, and reporting that she is familiar with this exercise, and finds it helpful for calming down when overwhelmed.  She reported that she was able to visualize being at the [facility] with her family, and could  watch her grandchildren running around and playing, which filled her with joy.  She reported that she would continue to practice this as part of her self-care  routine.    Third Therapeutic Activity: Psycho-educational portion of group was provided by Christie Beckers, Mudlogger of community education with [employer].  Alexandra provided information on history of her local agency, mission statement, and the variety of unique services offered which group members might find beneficial to engage in, including both virtual and in-person support groups, as well as peer support program for mentoring.  Alexandra offered time to answer member's questions regarding services and encouraged them to consider utilizing these services to assist in working towards their individual wellness goals.  Intervention was effective, as evidenced by [employer] participating in discussion with speaker on the subject, reporting that she has already been receiving services through [facility], and feels that this has made a significant difference in her mental health, since groups offer a safe space to share and receive support from peers.    Assessment and Plan: Counselor recommends that Deyra remain in IOP treatment to better manage mental health symptoms, ensure stability and pursue completion of treatment plan goals. Counselor recommends adherence to crisis/safety plan, taking medications as prescribed, and following up with medical professionals if any issues arise.   Follow Up Instructions: Counselor will send Webex link for next session. Essance was advised to call back or seek an in-person evaluation if the symptoms worsen or if the condition fails to improve as anticipated.   Collaboration of Care:   Medication Management AEB Dr. Fatima Sanger or [name], NP                                          Case Manager AEB Dellia Nims, CNA   Patient/Guardian was advised Release of Information must be obtained prior to any record release in order to collaborate their care with an outside provider. Patient/Guardian was advised if they have not already done so to contact the  registration department to sign all necessary forms in order for [name] to release information regarding their care.   Consent: Patient/Guardian gives verbal consent for treatment and assignment of benefits for services provided during this visit. Patient/Guardian expressed understanding and agreed to proceed.  I provided 180 minutes of non-face-to-face time during this encounter.   [name], [place], LCAS 07/20/22

## 2022-07-23 ENCOUNTER — Other Ambulatory Visit (HOSPITAL_COMMUNITY): Payer: Medicare Other | Admitting: Licensed Clinical Social Worker

## 2022-07-23 DIAGNOSIS — F331 Major depressive disorder, recurrent, moderate: Secondary | ICD-10-CM

## 2022-07-23 DIAGNOSIS — F4310 Post-traumatic stress disorder, unspecified: Secondary | ICD-10-CM

## 2022-07-23 DIAGNOSIS — F411 Generalized anxiety disorder: Secondary | ICD-10-CM

## 2022-07-23 NOTE — Progress Notes (Signed)
Virtual Visit via Video Note   I connected with Meghan [name] on 07/23/22 at  9:00 AM EDT by a video enabled telemedicine application and verified that I am speaking with the correct person using two identifiers.   At orientation to the IOP program, Case Manager discussed the limitations of evaluation and management by telemedicine and the availability of in person appointments. The patient expressed understanding and agreed to proceed with virtual visits throughout the duration of the program.   Location:  Patient: Patient Home Provider: [facility] [place] [facility]   History of Present Illness: Major Depressive Disorder, recurrent, moderate, Generalized Anxiety Disorder and PTSD  Observations/Objective: Check In: Case Manager checked in with all participants to review discharge dates, insurance authorizations, work-related documents and needs from the treatment team regarding medications. Meghan [name] stated needs and engaged in discussion.    Initial Therapeutic Activity: Counselor facilitated a check-in with Meghan [name] to assess for safety, sobriety and medication compliance.  Counselor also inquired about Meghan [name]'s current emotional ratings, as well as any significant changes in thoughts, feelings or behavior since previous check in.  Meghan [name] presented for session on time and was alert, oriented x5, with no evidence or self-report of active SI/HI or A/V H.  Meghan [name] reported compliance with medication and denied use of alcohol or illicit substances.  Meghan [name] reported scores of 0/10 for depression, 10/10 for anxiety, and 0/10 for anger/irritability.  Meghan [name] denied any recent outbursts or panic attacks.  Meghan [name] reported that a recent success was taking time over the weekend to clean her entire apartment, including the floors, kitchen and bathroom.  She stated "I wanted everything to look good ahead of the inspection coming up this week".  Meghan [name] reported that a recent struggle was having her doctor call her last week, and inform her  that an additional procedure will need to be done, which caused her anxiety to flare up.  Meghan [name] reported that her goal today is to work on organizing her closet next to distract her.       Second Therapeutic Activity: Counselor introduced topic of grounding skills today.  Counselor defined these as simple strategies one can use to help detach from difficult thoughts or feelings temporarily by focusing on something else.  Counselor noted that grounding will not solve the problem at hand, but can provide the practitioner with time to regain control over their thoughts and/or feelings and prevent the situation from getting worse (i.e. interrupting a panic attack).  Counselor divided these into three categories (mental, physical, and soothing) and then provided examples of each which group members could practice during session.  Some of these included describing one's environment in detail or playing a categories game with oneself for mental category, taking a hot bath/shower, stretching, or carrying a grounding object for physical category, and saying kind statements, or visualizing people one cares about for soothing category.  Counselor inquired about which techniques members have used with success in the past, or will commit to learning, practicing, and applying now to improve coping abilities.  Intervention was effective, as evidenced by Meghan [name] participating in discussion on the subject, trying out several of the techniques during session, and expressing interest in adding several to her available coping skills, such as using her imagination to picture spending time with family at the beach, looking at pictures of her grandkids, and thinking of things to look forward to in the future.  Meghan [name] reported that she was experiencing more physical pain in later half of session, and requested to  leave to rest at 11:40am, which counselor agreed to.    Assessment and Plan: Counselor recommends that Meghan [name] remain in IOP  treatment to better manage mental health symptoms, ensure stability and pursue completion of treatment plan goals. Counselor recommends adherence to crisis/safety plan, taking medications as prescribed, and following up with medical professionals if any issues arise.   Follow Up Instructions: Counselor will send Webex link for next session. Meghan [name] was advised to call back or seek an in-person evaluation if the symptoms worsen or if the condition fails to improve as anticipated.   Collaboration of Care:   Medication Management AEB Dr. Fatima Sanger or [name], NP                                          Case Manager AEB Dellia Nims, CNA   Patient/Guardian was advised Release of Information must be obtained prior to any record release in order to collaborate their care with an outside provider. Patient/Guardian was advised if they have not already done so to contact the registration department to sign all necessary forms in order for [name] to release information regarding their care.   Consent: Patient/Guardian gives verbal consent for treatment and assignment of benefits for services provided during this visit. Patient/Guardian expressed understanding and agreed to proceed.  I provided 160 minutes of non-face-to-face time during this encounter.   [name], [place], LCAS 07/23/22

## 2022-07-24 ENCOUNTER — Telehealth (HOSPITAL_COMMUNITY): Payer: Self-pay | Admitting: Psychiatry

## 2022-07-24 ENCOUNTER — Other Ambulatory Visit (HOSPITAL_COMMUNITY): Payer: Medicare Other | Admitting: Licensed Clinical Social Worker

## 2022-07-24 DIAGNOSIS — F331 Major depressive disorder, recurrent, moderate: Secondary | ICD-10-CM | POA: Diagnosis not present

## 2022-07-24 DIAGNOSIS — F4310 Post-traumatic stress disorder, unspecified: Secondary | ICD-10-CM

## 2022-07-24 DIAGNOSIS — F411 Generalized anxiety disorder: Secondary | ICD-10-CM

## 2022-07-24 NOTE — Progress Notes (Signed)
Virtual Visit via Video Note   I connected with Meghan [name] on 07/24/22 at  9:00 AM EDT by a video enabled telemedicine application and verified that I am speaking with the correct person using two identifiers.   At orientation to the IOP program, Case Manager discussed the limitations of evaluation and management by telemedicine and the availability of in person appointments. The patient expressed understanding and agreed to proceed with virtual visits throughout the duration of the program.   Location:  Patient: Patient Home Provider: [facility] [place] [facility]   History of Present Illness: Major Depressive Disorder, recurrent, moderate, Generalized Anxiety Disorder and PTSD  Observations/Objective: Check In: Case Manager checked in with all participants to review discharge dates, insurance authorizations, work-related documents and needs from the treatment team regarding medications. [employer] stated needs and engaged in discussion.    Initial Therapeutic Activity: Counselor facilitated a check-in with Charlotta to assess for safety, sobriety and medication compliance.  Counselor also inquired about Rusti's current emotional ratings, as well as any significant changes in thoughts, feelings or behavior since previous check in.  Tareva presented for session on time and was alert, oriented x5, with no evidence or self-report of active SI/HI or A/V H.  Hyacinth reported compliance with medication and denied use of alcohol or illicit substances.  Shirah reported scores of 0/10 for depression, 10/10 for anxiety, and 10/10 for anger/irritability.  Everlynn denied any recent panic attacks.  Brylinn reported that a recent struggle was having an outburst yesterday when she got a call from a family member, and was in pain, but pushed herself to keep talking despite this.  Gloria reported that a recent success was talking to her sister yesterday after this event to process her feelings with someone supportive and vent.  Maxwell reported that  her goal today is to call this family member and apologize since she felt bad afterward.         Second Therapeutic Activity: Counselor introduced [name], [place] Chaplain to provide psychoeducation on topic of Grief and Loss with members today.  [name] began discussion by checking in with the group about their baseline mood today, general thoughts on what grief means to them and how it has affected them personally in the past.  [name] provided information on how the process of grief/loss can differ depending upon one's unique culture, and categories of loss one could experience (i.e. loss of a person, animal, relationship, job, identity, etc).  [name] encouraged members to be mindful of how pervasive loss can be, and how to recognize signs which could indicate that this is having an impact on one's overall mental health and wellbeing.  Intervention was effective, as evidenced by [employer] participating in discussion with speaker on the subject, reporting that it was good to start by reviewing previous types of losses since she could not remember several, and considers this to be an important subject to focus on during therapy due to the impact it has had on her mental health.  She reported that something which brings her joy and helps her cope with loss is spending time with loved ones that are still alive, and cherishing positive memories together, like times her grandchildren made her laugh.    Third Therapeutic Activity: Counselor discussed topic of gratitude journaling with members as a form of self-care.  Counselor virtually shared a handout with the group today which explained the benefits of this practice, including reduction in stress, increased happiness, and self-esteem.  Tips were also  provided to aid in practice, such as taking time with entries, writing about people one is grateful for, and setting goal for two entries per week for at least 10-20 minutes at a time.  Counselor also provided  group members with a variety of journaling prompts to choose from today, and encouraged each member to take time to write about something they are grateful for, with examples such as "Something beautiful I recently saw was..", "Something I can be proud of is.", "A reason to be excited for the future is." and more.  Members were encouraged to share their entry with the group, along with their perspective on the activity and motivation level towards making this a habit. Intervention was effective, as evidenced by [employer] participating in journaling activity, and choosing the prompts "Someone who I admire is." and "An unexpected good thing that happened was.".  Diantha expressed gratitude for her sister, who she became closer with after high school, and has felt support from despite numerous losses faced, a divorce, and health challenges.  Zuleima stated "She convinced me to come down here in the first place and I've been so much happier".  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    Assessment and Plan: Counselor recommends that Devynne remain in IOP treatment to better manage mental health symptoms, ensure stability and pursue completion of treatment plan goals. Counselor recommends adherence to crisis/safety plan, taking medications as prescribed, and following up with medical professionals if any issues arise.   Follow Up Instructions: Counselor will send Webex link for next session. Deann was advised to call back or seek an in-person evaluation if the symptoms worsen or if the condition fails to improve as anticipated.   Collaboration of Care:   Medication Management AEB Dr. Fatima Sanger or [name], NP                                          Case Manager AEB Dellia Nims, CNA   Patient/Guardian was advised Release of Information must be obtained prior to any record  release in order to collaborate their care with an outside provider. Patient/Guardian was advised if they have not already done so to contact the registration department to sign all necessary forms in order for [name] to release information regarding their care.   Consent: Patient/Guardian gives verbal consent for treatment and assignment of benefits for services provided during this visit. Patient/Guardian expressed understanding and agreed to proceed.  I provided 180 minutes of non-face-to-face time during this encounter.   [name], [place], LCAS 07/24/22
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
## 2022-07-25 ENCOUNTER — Other Ambulatory Visit (HOSPITAL_COMMUNITY): Payer: Medicare Other | Admitting: Psychiatry

## 2022-07-25 DIAGNOSIS — F331 Major depressive disorder, recurrent, moderate: Secondary | ICD-10-CM

## 2022-07-25 DIAGNOSIS — F411 Generalized anxiety disorder: Secondary | ICD-10-CM

## 2022-07-25 DIAGNOSIS — F4310 Post-traumatic stress disorder, unspecified: Secondary | ICD-10-CM

## 2022-07-25 NOTE — Progress Notes (Signed)
Virtual Visit via Video Note   I connected with Meghan [name] on 07/25/22 at  9:00 AM EDT by a video enabled telemedicine application and verified that I am speaking with the correct person using two identifiers.   At orientation to the IOP program, Case Manager discussed the limitations of evaluation and management by telemedicine and the availability of in person appointments. The patient expressed understanding and agreed to proceed with virtual visits throughout the duration of the program.   Location:  Patient: Patient Home Provider: [facility] [place] [facility]   History of Present Illness: Major Depressive Disorder, recurrent, moderate, Generalized Anxiety Disorder and PTSD  Observations/Objective: Check In: Case Manager checked in with all participants to review discharge dates, insurance authorizations, work-related documents and needs from the treatment team regarding medications. Meghan [name] stated needs and engaged in discussion.    Initial Therapeutic Activity: Counselor facilitated a check-in with Meghan [name] to assess for safety, sobriety and medication compliance.  Counselor also inquired about Meghan [name]'s current emotional ratings, as well as any significant changes in thoughts, feelings or behavior since previous check in.  Meghan [name] presented for session on time and was alert, oriented x5, with no evidence or self-report of active SI/HI or A/V H.  Meghan [name] reported compliance with medication and denied use of alcohol or illicit substances.  Meghan [name] reported scores of 3/10 for depression, 10/10 for anxiety, and 10/10 for anger/irritability.  Meghan [name] reported that she experienced a panic attack yesterday and outburst this morning.  Meghan [name] reported that a recent success was getting out of the house yesterday to visit the barber.  Meghan [name] reported that a recent struggle was having issues with her contacts yesterday, so she will need to visit the optometrist today.  She reported that she also had poor sleep last night and is in  pain today.  Meghan [name] reported that her goal today is to also visit her family doctor today.       Second Therapeutic Activity: Counselor utilized a Radio broadcast assistant with group members today to guide discussion on topic of codependency.  This handout defined codependency as excessive emotional or psychological reliance upon someone who requires support on account of an illness or addiction.  It also explained how this issue presents in dysfunctional family systems, including behavior such as denying existence of problems, rigid boundaries on communication, strained trust, lack of individuality, and reinforcement of unhealthy coping mechanisms such as substance use.  Characteristics of co-dependent people were listed for assistance with identification, such as extreme need for approval/recognition, difficulty identifying feelings, poor communication, and more.  Members were also tasked with completing a questionnaire in order to identify signs of codependency and results were discussed afterward.  This handout also offered strategies for resolving co-dependency within one's network, including increased use of assertive communication skills in order to set appropriate boundaries.  Intervention was effective, as evidenced by Meghan [name] actively participating in discussion on the subject, and completing codependency questionnaire, with 8 out of 20 positive responses.  Meghan [name] reported that due to her physical condition today, she found it hard to focus, and requested to leave group early in order to go to urgent care with her sister.  Counselor was agreeable to this, and informed care team of early departure.  Meghan [name] agreed to followup tomorrow unless admitted for extended stay.    Assessment and Plan: Counselor recommends that Meghan [name] remain in IOP treatment to better manage mental health symptoms, ensure stability and pursue completion of treatment plan goals. Counselor recommends adherence to crisis/safety plan,  taking  medications as prescribed, and following up with medical professionals if any issues arise.   Follow Up Instructions: Counselor will send Webex link for next session. Meghan [name] was advised to call back or seek an in-person evaluation if the symptoms worsen or if the condition fails to improve as anticipated.   Collaboration of Care:   Medication Management AEB Dr. Fatima Sanger or [name], NP                                          Case Manager AEB Dellia Nims, CNA   Patient/Guardian was advised Release of Information must be obtained prior to any record release in order to collaborate their care with an outside provider. Patient/Guardian was advised if they have not already done so to contact the registration department to sign all necessary forms in order for [name] to release information regarding their care.   Consent: Patient/Guardian gives verbal consent for treatment and assignment of benefits for services provided during this visit. Patient/Guardian expressed understanding and agreed to proceed.  I provided 170 minutes of non-face-to-face time during this encounter.   [name], [place], LCAS 07/25/22

## 2022-07-26 ENCOUNTER — Other Ambulatory Visit (HOSPITAL_COMMUNITY): Payer: Medicare Other | Admitting: Licensed Clinical Social Worker

## 2022-07-26 DIAGNOSIS — F331 Major depressive disorder, recurrent, moderate: Secondary | ICD-10-CM | POA: Diagnosis not present

## 2022-07-26 DIAGNOSIS — F4310 Post-traumatic stress disorder, unspecified: Secondary | ICD-10-CM

## 2022-07-26 DIAGNOSIS — F411 Generalized anxiety disorder: Secondary | ICD-10-CM

## 2022-07-26 NOTE — Progress Notes (Signed)
Virtual Visit via Video Note   I connected with Meghan [name] on 07/26/22 at  9:00 AM EDT by a video enabled telemedicine application and verified that I am speaking with the correct person using two identifiers.   At orientation to the IOP program, Case Manager discussed the limitations of evaluation and management by telemedicine and the availability of in person appointments. The patient expressed understanding and agreed to proceed with virtual visits throughout the duration of the program.   Location:  Patient: Patient Home Provider: [facility]   History of Present Illness: Major Depressive Disorder, recurrent, moderate, Generalized Anxiety Disorder and PTSD  Observations/Objective: Check In: Case Manager checked in with all participants to review discharge dates, insurance authorizations, work-related documents and needs from the treatment team regarding medications. Meghan [name] stated needs and engaged in discussion.    Initial Therapeutic Activity: Counselor facilitated a check-in with Meghan [name] to assess for safety, sobriety and medication compliance.  Counselor also inquired about Meghan [name]'s current emotional ratings, as well as any significant changes in thoughts, feelings or behavior since previous check in.  Meghan [name] presented for session on time and was alert, oriented x5, with no evidence or self-report of active SI/HI or A/V H.  Meghan [name] reported compliance with medication and denied use of alcohol or illicit substances.  Meghan [name] reported scores of 0/10 for depression, 10/10 for anxiety, and 10/10 for anger/irritability.  Meghan [name] denied any recent outbursts or panic attacks.  Meghan [name] reported that a recent struggle was feeling worse physically yesterday, which led to her sister taking her to the ER, and discovering that she had a double ear infection and eye infection.  She reported that they gave her drops for her eyes, ears, and some antibiotics for treatment.  Meghan [name] reported that her goal today is  to get out of the house and run errands with her sister.       Second Therapeutic Activity: Counselor covered topic of core beliefs with group today.  Counselor virtually shared a handout on the subject, which explained how everyone looks at the world differently, and two people can have the same experience, but have different interpretations of what happened.  Members were encouraged to think of these like sunglasses with different "shades" influencing perception towards positive or negative outcomes.  Examples of negative core beliefs were provided, such as "I'm unlovable", "I'm not good enough", and "I'm a bad person".  Members were asked to share which one(s) they could relate to, and then identify evidence which contradicts these beliefs.  Counselor also provided psychoeducation on positive affirmations today.  Counselor explained how these are positive statements which can be spoken out loud or recited mentally to challenge negative thoughts and/or core beliefs to improve mood and outlook each day.  Counselor shared a comprehensive list of affirmations virtually to members with different categories, including ones for health, confidence, success, and happiness.  Counselor invited members to look through this list and identify any which resonated with them, and practice saying them out loud with sincerity.  Intervention was effective, as evidenced by Meghan [name] successfully participating in discussion on the subject and reporting that she could relate to several negative core beliefs listed on the handout, such as "I feel trapped" and "I will end up alone".  Meghan [name] was able to successfully challenge these beliefs by listing evidence which contradicts them, reporting that she has several positive supports in her network that have helped her through challenging situations recently, including peers in group that she only just  met, and allowed herself to be honest and open with.  Meghan [name] also reported that she liked  several of the positive affirmations listed, such as "I get better every day in every way" and "My self-worth is not determined by a number on a scale".    Assessment and Plan: Counselor recommends that Meghan [name] remain in IOP treatment to better manage mental health symptoms, ensure stability and pursue completion of treatment plan goals. Counselor recommends adherence to crisis/safety plan, taking medications as prescribed, and following up with medical professionals if any issues arise.   Follow Up Instructions: Counselor will send Webex link for next session. Meghan [name] was advised to call back or seek an in-person evaluation if the symptoms worsen or if the condition fails to improve as anticipated.   Collaboration of Care:   Medication Management AEB Dr. Fatima Sanger or [name], NP                                          Case Manager AEB Dellia Nims, CNA   Patient/Guardian was advised Release of Information must be obtained prior to any record release in order to collaborate their care with an outside provider. Patient/Guardian was advised if they have not already done so to contact the registration department to sign all necessary forms in order for [name] to release information regarding their care.   Consent: Patient/Guardian gives verbal consent for treatment and assignment of benefits for services provided during this visit. Patient/Guardian expressed understanding and agreed to proceed.  I provided 180 minutes of non-face-to-face time during this encounter.   [name], [place], LCAS 07/26/22

## 2022-07-27 ENCOUNTER — Other Ambulatory Visit (HOSPITAL_COMMUNITY): Payer: Medicare Other | Admitting: Licensed Clinical Social Worker

## 2022-07-27 DIAGNOSIS — F331 Major depressive disorder, recurrent, moderate: Secondary | ICD-10-CM

## 2022-07-27 DIAGNOSIS — F411 Generalized anxiety disorder: Secondary | ICD-10-CM

## 2022-07-27 DIAGNOSIS — F4310 Post-traumatic stress disorder, unspecified: Secondary | ICD-10-CM

## 2022-07-27 NOTE — Progress Notes (Signed)
Virtual Visit via Video Note   I connected with Lache S. Reesor on 07/27/22 at  9:00 AM EDT by a video enabled telemedicine application and verified that I am speaking with the correct person using two identifiers.   At orientation to the IOP program, Case Manager discussed the limitations of evaluation and management by telemedicine and the availability of in person appointments. The patient expressed understanding and agreed to proceed with virtual visits throughout the duration of the program.   Location:  Patient: Patient Home Provider: Clinical Home Office   History of Present Illness: Major Depressive Disorder, recurrent, moderate, Generalized Anxiety Disorder and PTSD  Observations/Objective: Check In: Case Manager checked in with all participants to review discharge dates, insurance authorizations, work-related documents and needs from the treatment team regarding medications. Terie stated needs and engaged in discussion.    Initial Therapeutic Activity: Counselor facilitated a check-in with Bobby to assess for safety, sobriety and medication compliance.  Counselor also inquired about Cruzita's current emotional ratings, as well as any significant changes in thoughts, feelings or behavior since previous check in.  Malicia presented for session on time and was alert, oriented x5, with no evidence or self-report of active SI/HI or A/V H.  Kahley reported compliance with medication and denied use of alcohol or illicit substances.  Lariah reported scores of 10/10 for depression, 10/10 for anxiety, and 0/10 for anger/irritability.  Brynley denied any recent outbursts or panic attacks.  Laynee reported that a recent struggle was feeling "All over the place" today, since he has continued to worry about her upcoming surgery and recently learned that her sister will not be able to accompany her to the appointment.  Harley reported that her goal today is to visit the lab to get blood drawn ahead of this  appointment.         Second Therapeutic Activity: Counselor proposed practice of guided imagery exercise with members today to improve their relaxation and stress management skills.  This visualization featured a casual walk through a nature trail in a forest on a pleasant fall day.  Counselor invited members to get comfortable, achieve a relaxing breathing pattern, and then began narration of this visualization, including various sensory details (i.e. feeling of the sun on the skin, smell of pine trees, sound of running stream and breeze through leaves) to enhance experience over course of activity.  Counselor processed experience afterward with each member, inquiring about effectiveness of activity, any issues that may have arisen, and whether they intend to add this to self-care routine.  Intervention was effective, as evidenced by [employer] participating in activity successfully and reporting that she enjoyed it and would plan to practice it again, as she was able to recall a time that she and her sister took a walk in the park.  Wilsie reported that this was a positive memory for her to reflect upon, and lifted her mood.      Third Therapeutic Activity: Counselor also acknowledged 2 graduating group members today by prompting each one to reflect on progress made since beginning the MHIOP program, notable takeaways from sessions attended, challenges overcome, and plan for continued care following discharge. Counselor and group members shared observations of growth, words of encouragement and support as these individuals transitioned out of the program today.   Judye participated in activity by offering words of encouragement and support for both departing members, as well as sharing resources that could aid them during transition.    Assessment and Plan: Counselor recommends  that Keokea remain in IOP treatment to better manage mental health symptoms, ensure stability and pursue completion of treatment plan  goals. Counselor recommends adherence to crisis/safety plan, taking medications as prescribed, and following up with medical professionals if any issues arise.   Follow Up Instructions: Counselor will send Webex link for next session. Julya was advised to call back or seek an in-person evaluation if the symptoms worsen or if the condition fails to improve as anticipated.   Collaboration of Care:   Medication Management AEB Dr. Fatima Sanger or [name], NP                                          Case Manager AEB Dellia Nims, CNA   Patient/Guardian was advised Release of Information must be obtained prior to any record release in order to collaborate their care with an outside provider. Patient/Guardian was advised if they have not already done so to contact the registration department to sign all necessary forms in order for [name] to release information regarding their care.   Consent: Patient/Guardian gives verbal consent for treatment and assignment of benefits for services provided during this visit. Patient/Guardian expressed understanding and agreed to proceed.  I provided 180 minutes of non-face-to-face time during this encounter.   [name], [place], LCAS 07/27/22

## 2022-07-28 LAB — BASIC METABOLIC PANEL
BUN/Creatinine Ratio: 17 (ref 9–23)
BUN: 18 mg/dL (ref 6–24)
CO2: 24 mmol/L (ref 20–29)
Calcium: 10.5 mg/dL — ABNORMAL HIGH (ref 8.7–10.2)
Chloride: 106 mmol/L (ref 96–106)
Creatinine, Ser: 1.03 mg/dL — ABNORMAL HIGH (ref 0.57–1.00)
Glucose: 86 mg/dL (ref 70–99)
Potassium: 4.6 mmol/L (ref 3.5–5.2)
Sodium: 144 mmol/L (ref 134–144)
eGFR: 63 mL/min/{1.73_m2} (ref >59)

## 2022-07-28 LAB — CBC
Hematocrit: 37.5 % (ref 34.0–46.6)
Hemoglobin: 12.3 g/dL (ref 11.1–15.9)
MCH: 26.4 pg — ABNORMAL LOW (ref 26.6–33.0)
MCHC: 32.8 g/dL (ref 31.5–35.7)
MCV: 81 fL (ref 79–97)
Platelets: 291 10*3/uL (ref 150–450)
RBC: 4.66 x10E6/uL (ref 3.77–5.28)
RDW: 14.3 % (ref 11.7–15.4)
WBC: 7.2 10*3/uL (ref 3.4–10.8)

## 2022-07-30 ENCOUNTER — Telehealth (HOSPITAL_COMMUNITY): Payer: Self-pay | Admitting: Psychiatry

## 2022-07-30 ENCOUNTER — Telehealth: Payer: Self-pay | Admitting: Internal Medicine

## 2022-07-30 ENCOUNTER — Other Ambulatory Visit (HOSPITAL_COMMUNITY): Payer: Medicare Other | Attending: Licensed Clinical Social Worker | Admitting: Professional

## 2022-07-30 DIAGNOSIS — F4310 Post-traumatic stress disorder, unspecified: Secondary | ICD-10-CM | POA: Insufficient documentation

## 2022-07-30 DIAGNOSIS — F411 Generalized anxiety disorder: Secondary | ICD-10-CM | POA: Diagnosis not present

## 2022-07-30 DIAGNOSIS — F331 Major depressive disorder, recurrent, moderate: Secondary | ICD-10-CM | POA: Insufficient documentation

## 2022-07-30 NOTE — Telephone Encounter (Signed)
Patient stated she was told by her PCP to stop taking Xarelto for her cardioversion. Patient stated she did not take Xarelto this past Friday or Saturday. She started taking it again last night. She wants to know if she can have cardioversion tomorrow or have it rescheduled.

## 2022-07-30 NOTE — Telephone Encounter (Signed)
D:  Returned pt's call.  Patient voiced that her procedure (Cardioversion) has been rescheduled to 08-22-22 d/t her needing Xarelto in her system.  "My PCP told me to stop the Xarelto but the hospital is telling me that I need to have that medication onboard."  Pt states she is very frustrated and angry.  Reports she felt relieved at first that the procedure isn't tomorrow but she wants to get it over with.  "Now I have to be anxious and worried the whole holiday." Pt denies SI/HI or A/V hallucinations.  A:  Provided pt with support.  Discussed holiday options with patient since she will be alone.  Encouraged pt to contact her kids to inform them that she will be home alone for the Thanksgiving holiday since her sister will be away on business.  Reiterated the skills pt has learned and to apply them.  Pt plans to attend [facility]-IOP tomorrow for d/c.  Inform team.  R:  Pt receptive.

## 2022-07-30 NOTE — Telephone Encounter (Signed)
Pt states that someone from the hospital told her to call and reschedule the procedure for 11/14 because she stopped taking her Xarelto. She states the she was told to stop taking it, but now because she stopped, they would like to reschedule Requesting a call back to explain and reschedule.

## 2022-07-30 NOTE — Patient Instructions (Signed)
D:  Patient will discharge tomorrow (07-31-22).  A:  Follow up with Dr. Altamese Lakeland in December 2023 and [name], [place] on 08-08-22 @ 1pm.  Encouraged support groups through the [facility].  R:  Patient receptive.

## 2022-07-30 NOTE — Telephone Encounter (Signed)
Spoke with patient. Cardioversion rescheduled for 12/6 at 9:30 with Dr. Harriet Masson.

## 2022-07-31 ENCOUNTER — Ambulatory Visit (HOSPITAL_COMMUNITY): Payer: Medicare Other | Admitting: Licensed Clinical Social Worker

## 2022-07-31 ENCOUNTER — Other Ambulatory Visit (HOSPITAL_COMMUNITY): Payer: Medicare Other | Attending: Licensed Clinical Social Worker | Admitting: Psychiatry

## 2022-07-31 ENCOUNTER — Other Ambulatory Visit (HOSPITAL_COMMUNITY): Payer: Medicare Other | Admitting: Licensed Clinical Social Worker

## 2022-07-31 DIAGNOSIS — F331 Major depressive disorder, recurrent, moderate: Secondary | ICD-10-CM

## 2022-07-31 DIAGNOSIS — F411 Generalized anxiety disorder: Secondary | ICD-10-CM | POA: Diagnosis not present

## 2022-07-31 DIAGNOSIS — F4310 Post-traumatic stress disorder, unspecified: Secondary | ICD-10-CM | POA: Insufficient documentation

## 2022-07-31 DIAGNOSIS — F339 Major depressive disorder, recurrent, unspecified: Secondary | ICD-10-CM | POA: Insufficient documentation

## 2022-07-31 DIAGNOSIS — F329 Major depressive disorder, single episode, unspecified: Secondary | ICD-10-CM | POA: Diagnosis present

## 2022-07-31 NOTE — Progress Notes (Signed)
Virtual Visit via Video Note   I connected with Meghan [name] on 07/31/22 at  9:00 AM EDT by a video enabled telemedicine application and verified that I am speaking with the correct person using two identifiers.   At orientation to the IOP program, Case Manager discussed the limitations of evaluation and management by telemedicine and the availability of in person appointments. The patient expressed understanding and agreed to proceed with virtual visits throughout the duration of the program.   Location:  Patient: Patient Home Provider: Clinical Home Office   History of Present Illness: Major Depressive Disorder, recurrent, moderate, Generalized Anxiety Disorder and PTSD  Observations/Objective: Check In: Case Manager checked in with all participants to review discharge dates, insurance authorizations, work-related documents and needs from the treatment team regarding medications. [name] stated needs and engaged in discussion.    Initial Therapeutic Activity: Counselor facilitated a check-in with Meghan [name] to assess for safety, sobriety and medication compliance.  Counselor also inquired about Meghan [name]'s current emotional ratings, as well as any significant changes in thoughts, feelings or behavior since previous check in.  [name] presented for session on time and was alert, oriented x5, with no evidence or self-report of active SI/HI or A/V H.  Meghan [name] reported compliance with medication and denied use of alcohol or illicit substances.  Meghan [name] reported scores of 5/10 for depression, 10/10 for anxiety, and 10/10 for anger/irritability.  Meghan [name] denied any recent panic attacks.  Meghan [name] reported that a recent struggle was having her medical procedure postponed due to a medication issue.  Meghan [name] stated "Now I have to wait another 3 weeks and I'm freaking out".  She reported that this has caused outbursts, but she was eager to be in group today and receive support from peers and care team.  Meghan [name] reported that her  goal today is to find healthy activities to engage in which can distract her until the appointment date arrives.         Second Therapeutic Activity: Counselor introduced [name], [place] Chaplain to provide psychoeducation on topic of Grief and Loss with members today.  Meghan [name] began discussion by checking in with the group about their baseline mood today, general thoughts on what grief means to them and how it has affected them personally in the past.  Meghan [name] provided information on how the process of grief/loss can differ depending upon one's unique culture, and categories of loss one could experience (i.e. loss of a person, animal, relationship, job, identity, etc).  Meghan [name] encouraged members to be mindful of how pervasive loss can be, and how to recognize signs which could indicate that this is having an impact on one's overall mental health and wellbeing.  Intervention was effective, as evidenced by Meghan [name] participating in discussion with speaker on the subject, reporting that she has been holding in difficult feelings since her childhood regarding losses of family, and recently she has been coming to terms with how this has affected her as an adult, including progression of depression.  She reported that something which brings her joy and helps her cope with loss is speaking with her children or church friends since they bring her comfort.    Third Therapeutic Activity: Counselor also acknowledged a graduating group member by prompting this member to reflect on progress made since beginning the [facility] program, notable takeaways from sessions attended, challenges overcome, and plan for continued care following discharge. Counselor and group members shared observations of growth, words of encouragement and support as this member transitioned out  of the program today.  Meghan [name] participated in activity by sharing about her experience in [place], stating "I'm proud of myself today.  It felt weird coming back  after 2 years, and I didn't know what to expect, but everybody was just so understanding and helpful.  It made me feel good".  She encouraged remaining members to continue attending and practicing skills learned from group.    Assessment and Plan: [name] will be discharged from [facility] today following successful completion of program.  Counselor recommends adherence to crisis/safety plan, taking medications as prescribed, and following up with medical professionals if any issues arise.   Follow Up Instructions: Meghan [name] was advised to call back or seek an in-person evaluation if the symptoms worsen or if the condition fails to improve as anticipated.   Collaboration of Care:   Medication Management AEB Dr. Fatima Sanger or [name], NP                                          Case Manager AEB Dellia Nims, CNA   Patient/Guardian was advised Release of Information must be obtained prior to any record release in order to collaborate their care with an outside provider. Patient/Guardian was advised if they have not already done so to contact the registration department to sign all necessary forms in order for [name] to release information regarding their care.   Consent: Patient/Guardian gives verbal consent for treatment and assignment of benefits for services provided during this visit. Patient/Guardian expressed understanding and agreed to proceed.  I provided 180 minutes of non-face-to-face time during this encounter.   [name], [place], LCAS 07/31/22

## 2022-07-31 NOTE — Progress Notes (Signed)
Virtual Visit via Video Note  I connected with Meghan [name] on '@TODAY'$ @ at  9:00 AM EST by a video enabled telemedicine application and verified that I am speaking with the correct person using two identifiers.  Location: Patient: at home Provider: at office   I discussed the limitations of evaluation and management by telemedicine and the availability of in person appointments. The patient expressed understanding and agreed to proceed.  I discussed the assessment and treatment plan with the patient. The patient was provided an opportunity to ask questions and all were answered. The patient agreed with the plan and demonstrated an understanding of the instructions.   The patient was advised to call back or seek an in-person evaluation if the symptoms worsen or if the condition fails to improve as anticipated.  I provided 30 minutes of non-face-to-face time during this encounter.   Dellia Nims, M.Ed,CNA   Patient ID: Meghan [name], female   DOB: October 15, 1965, 56 y.o.   MRN: [id number] D: As previous admit note states:  Pt referred to [facility] per therapist [name], LCSW) treatment for worsening depressive an anxiety symptoms.  Pt denies SI/HI or A/V hallucinations.  Pt is well known to this case manager d/t previous admits in [place].  Cc: previous notes.  Stressors:  1) conflictual relationships with church members.  Apparently, they have been gossiping about pt.  2) Unresolved grief/loss issues:  a close friend and a family member. Pt attended all fifteen days in MH-IOP.  Pt reports overall mood has improved; except feeling frustrated about her surgery (Cardioversion) being rescheduled to 08-22-22.  Pt was able to talk to her adult children yesterday.  Pt is wanting to go to be with them a few days for the holiday. On a scale of 1-10 (10 being the worst); pt rates her depression at a 8 and anxiety at a 10.  Reports the #'s are high due to surgery being rescheduled.  Denies SI/HI or A/V  hallucinations. A:  D/C pt today. F/U with Dr. Altamese Waller in December 2023 and [name], [place] on 08-08-22 @ 1 pm.  Pt was advised of ROI must be obtained prior to any records release in order to collaborate her care with an outside provider.  Pt was advised if she has not already done so to contact the front desk to sign all necessary forms in order for MH-IOP to release info re: her care. Consent:  Pt gives verbal consent for tx and assignment of benefits for services provided during this telehealth group process.  Pt expressed understanding and agreed to proceed. Collaboration of care:  Collaborate with Dr. Oswaldo Conroy, Dr. Armando Gang, and [name], LCSW AEB .  Strongly encouraged support groups.   R:  Pt receptive.  Dellia Nims, M.Ed,CNA

## 2022-07-31 NOTE — Progress Notes (Signed)
[facility] Discharge Summary   Virtual Visit via Video Note  I connected with Meghan [name] on 07/31/22 at  9:00 AM EST by a video enabled telemedicine application and verified that I am speaking with the correct person using two identifiers.  Location: Patient: Home Provider: [facility]   I discussed the limitations of evaluation and management by telemedicine and the availability of in person appointments. The patient expressed understanding and agreed to proceed.  HILDEGARDE DUNAWAY [id number]  Admission date: 07/09/2022 Discharge date: 11/14/203  Reason for admission:  Patient is a 56 year old African-American female with past psychiatric history of MDD, GAD, PTSD presented to IOP program for worsening depression and anxiety.  Patient started IOP program on 07/09/2022.   Chemical Use History:  Alcohol: Denies Tobacco:Denies Illicit drugs-Denies  Family of Origin Issues: Reports no Psychiatric issues or known suicides  Progress in Program Toward Treatment Goals: Progressing   Psychiatric Specialty Exam: Physical Exam Vitals and nursing note reviewed.  Constitutional:      General: She is not in acute distress.    Appearance: Normal appearance. She is obese. She is not ill-appearing or toxic-appearing.  HENT:     Head: Normocephalic and atraumatic.  Pulmonary:     Effort: Pulmonary effort is normal.  Neurological:     General: No focal deficit present.     Mental Status: She is alert.     Review of Systems  Respiratory:  Negative for shortness of breath.   Cardiovascular:  Negative for chest pain.  Gastrointestinal:  Negative for abdominal pain, constipation, diarrhea, nausea and vomiting.  Neurological:  Negative for dizziness, weakness and headaches.  Psychiatric/Behavioral:  Positive for dysphoric mood (mild) and sleep disturbance (chronic). Negative for hallucinations and suicidal ideas. The patient is nervous/anxious.      There were no vitals taken for this visit.There is no height or weight on file to calculate BMI.  General Appearance: Casual and Fairly Groomed  Eye Contact:  Good  Speech:  Clear and Coherent and Normal Rate  Volume:  Normal  Mood:   "ok"  Affect:  Appropriate and Congruent  Thought Process:  Coherent and Goal Directed  Orientation:  Full (Time, Place, and Person)  Thought Content:  WDL and Logical  Suicidal Thoughts:  No  Homicidal Thoughts:  No  Memory:  Immediate;   Good Recent;   Good  Judgement:  Good  Insight:  Good  Psychomotor Activity:  Normal  Concentration:  Concentration: Good and Attention Span: Good  Recall:  Good  Fund of Knowledge:  Good  Language:  Good  Akathisia:  Negative  Handed:  Right  AIMS (if indicated):     Assets:  Communication Skills Desire for Improvement Financial Resources/Insurance Housing  ADL's:  Intact  Cognition:  WNL  Sleep:   poor    Progress (rationale):  She reports that she has received significant benefit from the program.  She reports that her depression is better.  She reports that prior to yesterday her anxiety was better but that it is currently more elevated.  She had been scheduled for cardioversion and other procedures for today, however, it was canceled after discovering her PCP had stopped her Xarelto which she needs to be on to undergo the procedure.  The procedure has now been rescheduled.  She reports that she was pretty upset yesterday but is doing better today.  She reports that she has 3 different support groups and will be continuing to attend them  after leaving this program.  Encouraged her to continue daily self seconds and she reports that she already journals.  She reports a major source of strength for her is her grandkids.  She reports that they always talk on the phone every day after school.  She has a follow-up scheduled for is the health and her therapist.  She reports no SI, HI, or AVH.  She reports her  appetite is fair.  She reports her sleep is still poor but that this has been a chronic issue that her PCP and sleep specialist have been working on.  She reports no other concerns at present.  Collaboration of Care: Other IOP  Patient/Guardian was advised Release of Information must be obtained prior to any record release in order to collaborate their care with an outside provider. Patient/Guardian was advised if they have not already done so to contact the registration department to sign all necessary forms in order for [name] to release information regarding their care.   Consent: Patient/Guardian gives verbal consent for treatment and assignment of benefits for services provided during this visit. Patient/Guardian expressed understanding and agreed to proceed.   [name] 07/31/2022     Follow Up Instructions:    I discussed the assessment and treatment plan with the patient. The patient was provided an opportunity to ask questions and all were answered. The patient agreed with the plan and demonstrated an understanding of the instructions.   The patient was advised to call back or seek an in-person evaluation if the symptoms worsen or if the condition fails to improve as anticipated.  I provided 20 minutes of non-face-to-face time during this encounter.   Briant Cedar, MD

## 2022-08-01 ENCOUNTER — Ambulatory Visit (INDEPENDENT_AMBULATORY_CARE_PROVIDER_SITE_OTHER): Payer: Medicare Other

## 2022-08-01 ENCOUNTER — Ambulatory Visit (INDEPENDENT_AMBULATORY_CARE_PROVIDER_SITE_OTHER): Payer: Medicare Other | Admitting: Podiatry

## 2022-08-01 ENCOUNTER — Other Ambulatory Visit (HOSPITAL_COMMUNITY): Payer: Medicare Other | Admitting: Licensed Clinical Social Worker

## 2022-08-01 DIAGNOSIS — Z9889 Other specified postprocedural states: Secondary | ICD-10-CM

## 2022-08-01 DIAGNOSIS — Z01818 Encounter for other preprocedural examination: Secondary | ICD-10-CM

## 2022-08-01 DIAGNOSIS — M19071 Primary osteoarthritis, right ankle and foot: Secondary | ICD-10-CM | POA: Diagnosis not present

## 2022-08-01 DIAGNOSIS — M19072 Primary osteoarthritis, left ankle and foot: Secondary | ICD-10-CM | POA: Diagnosis not present

## 2022-08-01 DIAGNOSIS — M722 Plantar fascial fibromatosis: Secondary | ICD-10-CM | POA: Diagnosis not present

## 2022-08-01 MED ORDER — GABAPENTIN 300 MG PO CAPS: 300.0000 mg | ORAL_CAPSULE | Freq: Three times a day (TID) | ORAL | 3 refills | Status: DC

## 2022-08-01 NOTE — Addendum Note (Signed)
Addended by: Boneta Lucks on: 08/01/2022 02:02 PM   Modules accepted: Orders

## 2022-08-01 NOTE — Progress Notes (Signed)
Subjective:  Patient ID: Meghan [name], female    DOB: September 20, 1965,  MRN: [id number]  Chief Complaint  Patient presents with   Routine Post Op    POV #3 DOS 05/28/2022 RT 1ST MPJ FUSION    DOS: 05/28/2022 Procedure: Right partial metatarsophalangeal joint fusion  56 y.o. female returns for post-op check.  Patient states she is doing well with minimal pain occasional numbness tingling.  She would like to discuss left side now.  She states that hurts with ambulation she has failed all conservative care including padding protecting shoe gear modification she would like to discuss surgical fusion of the toe as well.  Review of Systems: Negative except as noted in the HPI. Denies N/V/F/Ch.  Past Medical History:  Diagnosis Date   Anxiety    Arthritis    left knee   Asthma    Atrial fibrillation ([place]) 02/16/2019   CHF (congestive heart failure) ([place]) 01/2019   Physician from [place] informed her she had this    Diabetes mellitus without complication ([place])    Dysrhythmia    Atrial fibrillation   GERD (gastroesophageal reflux disease)    Hiatal hernia    History of pulmonary embolus ([place])    Hypercholesterolemia    Macular degeneration    of left eye   MDD (major depressive disorder)    Osteoarthritis    knees   Pneumonia    in the past   PTSD (post-traumatic stress disorder)    Pulmonary embolism ([place])    bilateral, none since 2003   Seizure disorder ([place])    Per patient last seizure was in 2020.   Sleep apnea CPAP   [facility]  Uses CPAP   Stroke ([place])    1989 and 1995 (left sided weakness)   Vitamin D deficiency     Current Outpatient Medications:    gabapentin (NEURONTIN) 300 MG capsule, Take 1 capsule (300 mg total) by mouth 3 (three) times daily., Disp: 90 capsule, Rfl: 3   acetaminophen (TYLENOL) 500 MG tablet, Take 1,000 mg by mouth every 6 (six) hours as needed for moderate pain., Disp: , Rfl:    albuterol (VENTOLIN HFA) 108 (90 Base) MCG/ACT inhaler, Inhale 2 puffs into  the lungs every 6 (six) hours as needed for wheezing or shortness of breath., Disp: , Rfl:    atorvastatin (LIPITOR) 40 MG tablet, Take 40 mg by mouth every evening., Disp: , Rfl:    BREZTRI AEROSPHERE 160-9-4.8 MCG/ACT AERO, Inhale 2 puffs into the lungs in the morning and at bedtime. , Disp: , Rfl:    bumetanide (BUMEX) 1 MG tablet, Take 1 mg by mouth daily., Disp: , Rfl:    Cholecalciferol (VITAMIN D-3) 125 MCG (5000 UT) TABS, Take 5,000 Units by mouth daily., Disp: , Rfl:    diphenhydrAMINE HCl, Sleep, (ZZZQUIL) 50 MG/30ML LIQD, Take 30 mLs by mouth at bedtime as needed (sleep)., Disp: , Rfl:    flecainide (TAMBOCOR) 50 MG tablet, Take 50 mg by mouth 2 (two) times daily. , Disp: , Rfl:    HYDROcodone-acetaminophen (NORCO) 5-325 MG tablet, Take 1 tablet by mouth every 6 (six) hours as needed for moderate pain., Disp: 30 tablet, Rfl: 0   hydrOXYzine (ATARAX) 25 MG tablet, Take 1-2 tablets at bedtime for insomnia (Patient taking differently: Take 25 mg by mouth at bedtime as needed (for insomnia).), Disp: 45 tablet, Rfl: 1   levETIRAcetam (KEPPRA) 500 MG tablet, Take 1 tablet (500 mg total) by mouth 2 (two) times daily. (Patient  not taking: Reported on 07/27/2022), Disp: 180 tablet, Rfl: 3   Menthol-Camphor (TIGER BALM ARTHRITIS RUB) 11-11 % CREA, Apply 1 application  topically daily as needed (Knee pain)., Disp: , Rfl:    metFORMIN (GLUCOPHAGE) 500 MG tablet, Take 500 mg by mouth daily with breakfast., Disp: , Rfl:    metoprolol succinate (TOPROL-XL) 25 MG 24 hr tablet, Take 1 tablet (25 mg total) by mouth daily. Take with or immediately following a meal., Disp: 90 tablet, Rfl: 3   neomycin-polymyxin b-dexamethasone (MAXITROL) 3.5-10000-0.1 SUSP, Place 1 drop into both eyes., Disp: , Rfl:    Omega-3 Fatty Acids (FISH OIL) 1200 MG CAPS, Take 1,200 mg by mouth daily with breakfast., Disp: , Rfl:    omeprazole (PRILOSEC) 40 MG capsule, Take 40 mg by mouth daily., Disp: , Rfl:    rivaroxaban (XARELTO)  20 MG TABS tablet, Take 20 mg by mouth at bedtime. , Disp: , Rfl:    RYBELSUS 14 MG TABS, Take 14 mg by mouth daily., Disp: , Rfl:    sertraline (ZOLOFT) 50 MG tablet, Take 50 mg by mouth daily., Disp: , Rfl:   Social History   Tobacco Use  Smoking Status Never  Smokeless Tobacco Never    Allergies  Allergen Reactions   Acetaminophen-Codeine Swelling, Rash and Other (See Comments)    Tylenol with Codeine, Tylenol #3,  (facial swelling, hives)   Bee Venom Anaphylaxis   Meloxicam Anaphylaxis and Rash   Tramadol Hives and Other (See Comments)    Patient stated "I was trippin' and I do not want that ever again"   Codeine Swelling    hives   Coconut (Cocos Nucifera) Rash and Other (See Comments)    ANY coconut products    Tomato Rash    Fresh    Objective:  There were no vitals filed for this visit. There is no height or weight on file to calculate BMI. Constitutional Well developed. Well nourished.  Vascular Foot warm and well perfused. Capillary refill normal to all digits.   Neurologic Normal speech. Oriented to person, place, and time. Epicritic sensation to light touch grossly present bilaterally.  Dermatologic Skin completely reepithelialized.  No signs of Deis is noted no complication noted.  No pain on palpation  Orthopedic: Pain on palpation left first metatarsophalangeal joint deep intra-articular pain noted pain and crepitus noted.  No pain at the sesamoidal complex.  Pain on palpation of first MPJ.   Radiographs: 3 views of skeletally mature the right foot: Hardware is intact no signs of backing or loosening noted.  Osseous bridging/consolidated noted at the first metatarsophalangeal joint good position alignment noted.  No signs of fractures or breakdown of hardware noted Assessment:   1. Status post surgery   2. Arthritis of first metatarsophalangeal (MTP) joint of right foot   3. Arthritis of first metatarsophalangeal (MTP) joint of left foot   4. Encounter for  preoperative examination for general surgical procedure     Plan:  Patient was evaluated and treated and all questions answered.  S/p foot surgery right -Clinically healed and doing well.  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  Given the amount of pain that she is experiencing the joint is in the setting of a bunion deformity I believe patient will benefit from best from MPJ fusion to the left side.  She states she is doing fine on the right side and would like to  proceed with the same procedure on the left side.  I discussed my preoperative intraoperative postoperative plan in extensive detail she states understanding like to proceed with surgery. -Informed surgical risk consent was reviewed and read aloud to the patient.  I reviewed the films.  I have discussed my findings with the patient in great detail.  I have discussed all risks including but not limited to infection, stiffness, scarring, limp, disability, deformity, damage to blood vessels and nerves, numbness, poor healing, need for braces, arthritis, chronic pain, amputation, death.  All benefits and realistic expectations discussed in great detail.  I have made no promises as to the outcome.  I have provided realistic expectations.  I have offered the patient a 2nd opinion, which they have declined and assured me they preferred to proceed despite the risks Were just   No follow-ups on file.

## 2022-08-01 NOTE — Progress Notes (Signed)
Patient presents today to pick up custom molded foot orthotics, diagnosed with plantar fascittis by Dr. Posey Pronto.   Orthotics were dispensed and fit was satisfactory. Reviewed instructions for break-in and wear. Written instructions given to patient.  Patient will follow up as needed.

## 2022-08-02 ENCOUNTER — Other Ambulatory Visit (HOSPITAL_COMMUNITY): Payer: Medicare Other | Admitting: Licensed Clinical Social Worker

## 2022-08-02 NOTE — Progress Notes (Signed)
Virtual Visit via Video Note   I connected with Meghan [name] on 07/30/22 at  9:00 AM EDT by a video enabled telemedicine application and verified that I am speaking with the correct person using two identifiers.   At orientation to the IOP program, Case Manager discussed the limitations of evaluation and management by telemedicine and the availability of in person appointments. The patient expressed understanding and agreed to proceed with virtual visits throughout the duration of the program.   Location:  Patient: Patient Home Provider: Clinical Home Office   History of Present Illness: Major Depressive Disorder, recurrent, moderate, Generalized Anxiety Disorder and PTSD  Observations/Objective: Check In: Case Manager checked in with all participants to review discharge dates, insurance authorizations, work-related documents and needs from the treatment team regarding medications. Meghan [name] stated needs and engaged in discussion.    Initial Therapeutic Activity: Counselor facilitated a check-in with Meghan [name] to assess for safety, sobriety and medication compliance.  Counselor also inquired about Meghan [name]'s current emotional ratings, as well as any significant changes in thoughts, feelings or behavior since previous check in.  Meghan [name] presented for session on time and was alert, oriented x5, with no evidence or self-report of active SI/HI or A/V H.  Meghan [name] reported compliance with medication and denied use of alcohol or illicit substances.  Meghan [name] reported scores of 10/10 for depression and 10/10 for anxiety.  Meghan [name] denied any recent outbursts or panic attacks.  Meghan [name] reported that a recent struggle is continued anxiety about her upcoming surgery tomorrow. Meghan [name] reported that her goal today is to go have a steak dinner with her sister.         Second Therapeutic Activity: Counselor led discussion in how to use "no" as a complete sentence, using I-statements, and giving self kudos for progress. [name] identified  if it is hard to give self credit for progress. Cln discussed "best friend test" and giving self as much "grace and space" as others are allowed. Cln provided examples of I-statements and discussed how they can be helpful in communicating with others. Cln and group discussed setting boundaries and using "no" as a complete sentence. Meghan [name] did not participate in the discussion. Pt left at 1041. Meghan [name], case manager, reached out to her.      Third Therapeutic Activity: Pt was not present.   Assessment and Plan: Counselor recommends that Meghan [name] remain in IOP treatment to better manage mental health symptoms, ensure stability and pursue completion of treatment plan goals. Counselor recommends adherence to crisis/safety plan, taking medications as prescribed, and following up with medical professionals if any issues arise.   Follow Up Instructions: Counselor will send Webex link for next session. Meghan [name] was advised to call back or seek an in-person evaluation if the symptoms worsen or if the condition fails to improve as anticipated.   Collaboration of Care:   Medication Management AEB Dr. Fatima [facility] or [name], NP                                          Case Manager AEB Meghan Nims, CNA   Patient/Guardian was advised Release of Information must be obtained prior to any record release in order to collaborate their care with an outside provider. Patient/Guardian was advised if they have not already done so to contact the registration department to sign all necessary forms in order for [name] to release information regarding  their care.   Consent: Patient/Guardian gives verbal consent for treatment and assignment of benefits for services provided during this visit. Patient/Guardian expressed understanding and agreed to proceed.  I provided 101 minutes of non-face-to-face time during this encounter.   Meghan [name], [facility] 07/30/22

## 2022-08-08 ENCOUNTER — Ambulatory Visit (INDEPENDENT_AMBULATORY_CARE_PROVIDER_SITE_OTHER): Payer: Medicare Other | Admitting: Licensed Clinical Social Worker

## 2022-08-08 DIAGNOSIS — F4310 Post-traumatic stress disorder, unspecified: Secondary | ICD-10-CM | POA: Diagnosis not present

## 2022-08-08 DIAGNOSIS — F411 Generalized anxiety disorder: Secondary | ICD-10-CM | POA: Diagnosis not present

## 2022-08-08 DIAGNOSIS — F331 Major depressive disorder, recurrent, moderate: Secondary | ICD-10-CM

## 2022-08-08 NOTE — Progress Notes (Signed)
Virtual Visit via Video Note   I connected with Meghan [name] on 08/08/22 at 1:00pm by video enabled telemedicine application and verified that I am speaking with the correct person using two identifiers.   I discussed the limitations, risks, security and privacy concerns of performing an evaluation and management service by video and the availability of in person appointments. I also discussed with the patient that there may be a patient responsible charge related to this service. The patient expressed understanding and agreed to proceed.   I discussed the assessment and treatment plan with the patient. The patient was provided an opportunity to ask questions and all were answered. The patient agreed with the plan and demonstrated an understanding of the instructions.   The patient was advised to call back or seek an in-person evaluation if the symptoms worsen or if the condition fails to improve as anticipated.   I provided 53 minutes of non-face-to-face time during this encounter.     [name], LCSW, LCAS _______________________ THERAPIST PROGRESS NOTE   Session Time:  1:00pm - 1:53pm    Location: Patient: Patient Home Provider: [facility] [place] Office    Participation Level: Active    Behavioral Response: Alert, casually dressed, depressed mood/affect    Type of Therapy:  Individual Therapy   Treatment Goals addressed: Mood management; Medication management  Progress Towards Goals: Progressing    Interventions: CBT: psychoeducation on how to address holiday specific triggers     Summary: Meghan [name] is a 56 year old divorced African American female that presented for therapy appointment today with diagnoses of Major Depressive Disorder, recurrent, moderate, Generalized Anxiety Disorder and PTSD.      Suicidal/Homicidal: None; without plan or intent                                                                                                                Therapist Response:   Clinician met with Meghan [name] for virtual therapy appointment and assessed for safety, sobriety, and medication compliance.  Meghan [name] presented for session on time and was alert, oriented x5, with no evidence or self-report of active SI/HI or A/V H.  Meghan [name] reported ongoing compliance with medication and denied any use of alcohol or illicit substances.  Clinician inquired about Meghan [name]'s current emotional ratings, as well as any significant changes in thoughts, feelings, or behavior since previous check-in.  Meghan [name] reported scores of 5/10 for depression, 0/10 for anxiety, and 0/10 for anger/irritability.  Meghan [name] denied any recent panic attacks or outbursts.  Meghan [name] reported that a recent success has been volunteering at her church with the children, stating "That is keeping me busy, and my mind preoccupied until the surgery comes up in December".  Meghan [name] also reported that she feels that her time in [place] was rewarding and has helped her prepare for upcoming surgery.  Meghan [name] reported that one struggle has been ruminating upon the upcoming Thanksgiving holiday, since she anticipates being alone for once and does not know how to handle this.  Clinician utilized a Geneticist, molecular  with Meghan [name] today on holiday preparedness.  This handout explained how holidays can be especially difficult for individuals struggling with mental health issues, and listed several typical stressors which can influence the experience negatively, including reminders of significant losses, interactions with problematic family members, feelings of loneliness, and anxiety. Helpful strategies were also offered for coping with challenges like these effectively, including setting aside adequate time for self-care, socialization with positive supports, and practice of relaxation skills.  Clinician assisted Meghan [name] in developing a comprehensive plan to cope with upcoming holiday in order to manage both depression and anxiety.  Intervention was effective, as evidenced by Meghan [name]'s  active engagement in discussion on subject, reporting that there are triggers to be mindful of due to upcoming holiday, including being away from her sister for once, feeling lonely as a result of this, unresolved grief from her grandmother passing around this time of year, and anxiety regarding upcoming surgery.  She reported that one of her close church friends offered to bring her over to a family dinner on this day so that she won't have to be alone, and after this discussion today, she has decided to take them up on this offer, stating "She knows me so well.  It might not be so bad after all".  [name] reported that she would also continue practice of coping skills learned from therapy, and call her sister that day to check in.  Clinician will continue to monitor.    Plan: Follow up again in 1 week.    Diagnosis: Major depressive disorder, recurrent, moderate; Generalized Anxiety Disorder; and PTSD   Collaboration of Care:   No collaboration of care required for this visit.                                                   Patient/Guardian was advised Release of Information must be obtained prior to any record release in order to collaborate their care with an outside provider. Patient/Guardian was advised if they have not already done so to contact the registration department to sign all necessary forms in order for [name] to release information regarding their care.    Consent: Patient/Guardian gives verbal consent for treatment and assignment of benefits for services provided during this visit. Patient/Guardian expressed understanding and agreed to proceed.  [name], [place], LCAS 08/08/22

## 2022-08-14 ENCOUNTER — Ambulatory Visit (HOSPITAL_COMMUNITY): Payer: Medicare Other | Attending: Internal Medicine

## 2022-08-14 DIAGNOSIS — I4892 Unspecified atrial flutter: Secondary | ICD-10-CM | POA: Diagnosis present

## 2022-08-14 LAB — ECHOCARDIOGRAM COMPLETE
Area-P 1/2: 3.68 cm2
S' Lateral: 2.7 cm

## 2022-08-15 ENCOUNTER — Encounter (HOSPITAL_COMMUNITY): Payer: Self-pay

## 2022-08-15 ENCOUNTER — Ambulatory Visit (HOSPITAL_COMMUNITY): Payer: Medicare Other | Admitting: Licensed Clinical Social Worker

## 2022-08-20 ENCOUNTER — Encounter: Payer: Self-pay | Admitting: Cardiovascular Disease

## 2022-08-20 ENCOUNTER — Ambulatory Visit: Payer: Medicare Other | Attending: Cardiovascular Disease | Admitting: Cardiovascular Disease

## 2022-08-20 VITALS — BP 112/74 | HR 74 | Ht 65.0 in | Wt 306.0 lb

## 2022-08-20 DIAGNOSIS — I483 Typical atrial flutter: Secondary | ICD-10-CM

## 2022-08-20 DIAGNOSIS — I480 Paroxysmal atrial fibrillation: Secondary | ICD-10-CM | POA: Diagnosis not present

## 2022-08-20 NOTE — H&P (View-Only) (Signed)
Electrophysiology Office Note:    Date:  08/20/2022   ID:  Meghan [name], DOB 14-Oct-1965, MRN [id number]  PCP:  Beverley Fiedler, [place] [facility] Cardiologist:  Janina Mayo, MD Electrophysiologist:  Melida Quitter, MD     Referring MD: Janina Mayo, MD   History of Present Illness:    Meghan [name] is a 56 y.o. female with a hx listed below, significant for multiple PEs now s/p IVC filter, stroke, HF with recovered EF, AF referred for arrhythmia management. I reviewed Dr. Nelly Laurence note from 07/19/22.  I reviewed all ECGs available in MUSE. With the exception of one ECG in 2020 that shows atrial fibrillation, all other ECGs show typical atrial flutter. She has had several ER visits with atrial flutter since arriving to the area in 2020. She is in flutter today and scheduled for DC cardioversion tomorrow.  She has shortness of breath and fatigue, palpitations. No chest pain, syncope, pre-syncope.  Past Medical History:  Diagnosis Date   Anxiety    Arthritis    left knee   Asthma    Atrial fibrillation ([place]) 02/16/2019   CHF (congestive heart failure) ([place]) 01/2019   Physician from [place] informed her she had this    Diabetes mellitus without complication ([employer] [place] [employer])    Dysrhythmia    Atrial fibrillation   GERD (gastroesophageal reflux disease)    Hiatal hernia    History of pulmonary embolus (PE)    Hypercholesterolemia    Macular degeneration    of left eye   MDD (major depressive disorder)    Osteoarthritis    knees   Pneumonia    in the past   PTSD (post-traumatic stress disorder)    Pulmonary embolism ([place])    bilateral, none since 2003   Seizure disorder ([place])    Per patient last seizure was in 2020.   Sleep apnea CPAP   [facility]  Uses CPAP   Stroke ([place])    1989 and 1995 (left sided weakness)   Vitamin D deficiency     Past Surgical History:  Procedure Laterality Date   ABDOMINAL HYSTERECTOMY  2003   complete   CARDIAC  CATHETERIZATION  2017   in [facility] in [place]  [phone]   x 2   CHOLECYSTECTOMY  2003   COLONOSCOPY WITH PROPOFOL N/A 06/05/2019   Procedure: COLONOSCOPY WITH PROPOFOL;  Surgeon: Carol Ada, MD;  [facility];  Service: Endoscopy;  Laterality: N/A;   CYST REMOVAL WITH BONE GRAFT Left 10/18/2020   Procedure: BONE GRAFTING OF ENCHONDROMA MIDDLE [place] OF LEFT MIDDLE FINGER;  Surgeon: Daryll Brod, MD;  Location: [place];  Service: Orthopedics;  Laterality: Left;  AXILLARY BLOCK   DIAGNOSTIC LAPAROSCOPY  2015; 2017   lap hernia repair x2   endoscopy     ENDOVENOUS ABLATION SAPHENOUS VEIN W/ LASER Left 08/31/2021   endovenous laser ablation left greater saphenous vein and stab phlebectomy 10-20 incisions left leg by Servando Snare MD   [place] Right 05/28/2022   Procedure: HALLUX FUSION METATARSAL PHALANGEAL JOIT;  Surgeon: [name], DPM;  Location: [place];  Service: Podiatry;  Laterality: Right;  BLOCK   HERNIA REPAIR  [id number], [id number]   umbilical hernia repair   IVC FILTER INSERTION  2003   Hx pulmonary embolus   POLYPECTOMY  06/05/2019   Procedure: POLYPECTOMY;  Surgeon: Carol Ada, MD;  [facility];  Service: Endoscopy;;  SHOULDER ARTHROSCOPY W/ ROTATOR CUFF REPAIR  08/28/2017   right shoulder   WRIST ARTHROSCOPY WITH DEBRIDEMENT Left 10/18/2020   Procedure: LEFT WRIST ARTHROSCOPY WITH DEBRIDEMENT;  Surgeon: Daryll Brod, MD;  Location: [place];  Service: Orthopedics;  Laterality: Left;  AXILLARY BLOCK   WRIST ARTHROSCOPY WITH DEBRIDEMENT Right 01/25/2022   Procedure: ARTHROSCOPY RIGHT WRIST WITH  DEBRIDEMENT/ SHRINKAGE;  Surgeon: Leanora Cover, MD;  Location: [place];  Service: Orthopedics;  Laterality: Right;    Current Medications: No outpatient medications have been marked as taking for the 08/20/22 encounter (Appointment) with Demia Viera, Yetta Barre, MD.     Allergies:   Acetaminophen-codeine, Bee venom,  Meloxicam, Tramadol, Codeine, Coconut (cocos nucifera), and Tomato   Social History   Socioeconomic History   Marital status: Divorced    Spouse name: Not on file   Number of children: 3   Years of education: Not on file   Highest education level: Not on file  Occupational History   Not on file  Tobacco Use   Smoking status: Never   Smokeless tobacco: Never  Vaping Use   Vaping Use: Never used  Substance and Sexual Activity   Alcohol use: Never   Drug use: Never   Sexual activity: Not on file    Comment: Hysterectomy  Other Topics Concern   Not on file  Social History Narrative   ** Merged History Encounter **       Social Determinants of Health   Financial Resource Strain: Not on file  Food Insecurity: Not on file  Transportation Needs: Not on file  Physical Activity: Not on file  Stress: Not on file  Social Connections: Not on file     Family History: The patient's family history includes Breast cancer in her maternal grandmother and mother; Congestive Heart Failure in her father; Diabetes in her father; Heart attack in her father.  ROS:   Please see the history of present illness.    All other systems reviewed and are negative.  EKGs/Labs/Other Studies Reviewed Today:     TTE 07/2022: Normal EF, mildly dilated LA  EKG:  Last EKG results: today - Atrial flutter with variable conduction   Recent Labs: 11/27/2021: ALT 14 07/27/2022: BUN 18; Creatinine, Ser 1.03; Hemoglobin 12.3; Platelets 291; Potassium 4.6; Sodium 144     Physical Exam:    VS:  There were no vitals taken for this visit.    Wt Readings from Last 3 Encounters:  07/19/22 300 lb (136.1 kg)  07/02/22 (!) 307 lb (139.3 kg)  06/14/22 288 lb (130.6 kg)     GEN:  Well nourished, well developed in no acute distress, obese CARDIAC: Irregular rhythm, no murmurs, rubs, gallops RESPIRATORY:  Normal work of breathing MUSCULOSKELETAL: 1+ edema    ASSESSMENT & PLAN:    Atrial flutter:  multiple recurrences resulting in hospitalization and CHF. I recommended ablation. We discussed the indication, rationale, logistics, anticipated benefits, and potential risks of the ablation procedure including but not limited to -- bleed at the groin access site, chest pain, damage to nearby organs such as the diaphragm, lungs, or esophagus, need for a drainage tube, or prolonged hospitalization. I explained that the risk for stroke, heart attack, need for open chest surgery, or even death is very low but not zero. she  expressed understanding and wishes to proceed.  Atrial fibrillation: not documented on ECG since 2020. Will continue to monitor after AF ablation Longterm use of anticoagulation: history of recurrent PE and stroke. She will  be on life-long anticoagulation        Medication Adjustments/Labs and Tests Ordered: Current medicines are reviewed at length with the patient today.  Concerns regarding medicines are outlined above.  No orders of the defined types were placed in this encounter.  No orders of the defined types were placed in this encounter.    Signed, Melida Quitter, MD  08/20/2022 9:55 AM    [place]

## 2022-08-20 NOTE — Progress Notes (Signed)
Electrophysiology Office Note:    Date:  08/20/2022   ID:  Meghan [name], DOB 04-25-1966, MRN [id number]  PCP:  Beverley Fiedler, [place] Prov[facility] Cardiologist:  Janina Mayo, MD Electrophysiologist:  Melida Quitter, MD     Referring MD: Janina Mayo, MD   History of Present Illness:    Meghan [name] is a 56 y.o. female with a hx listed below, significant for multiple PEs now s/p IVC filter, stroke, HF with recovered EF, AF referred for arrhythmia management. I reviewed Dr. Nelly Laurence note from 07/19/22.  I reviewed all ECGs available in MUSE. With the exception of one ECG in 2020 that shows atrial fibrillation, all other ECGs show typical atrial flutter. She has had several ER visits with atrial flutter since arriving to the area in 2020. She is in flutter today and scheduled for DC cardioversion tomorrow.  She has shortness of breath and fatigue, palpitations. No chest pain, syncope, pre-syncope.  Past Medical History:  Diagnosis Date   Anxiety    Arthritis    left knee   Asthma    Atrial fibrillation ([place]) 02/16/2019   CHF (congestive heart failure) ([place]) 01/2019   Physician from [place] informed her she had this    Diabetes mellitus without complication ([place])    Dysrhythmia    Atrial fibrillation   GERD (gastroesophageal reflux disease)    Hiatal hernia    History of pulmonary embolus (PE)    Hypercholesterolemia    Macular degeneration    of left eye   MDD (major depressive disorder)    Osteoarthritis    knees   Pneumonia    in the past   PTSD (post-traumatic stress disorder)    Pulmonary embolism ([place])    bilateral, none since 2003   Seizure disorder ([place])    Per patient last seizure was in 2020.   Sleep apnea CPAP   [facility]  Uses CPAP   Stroke ([place])    1989 and 1995 (left sided weakness)   Vitamin D deficiency     Past Surgical History:  Procedure Laterality Date   ABDOMINAL HYSTERECTOMY  2003   complete   CARDIAC  CATHETERIZATION  2017   in [facility] in [place]  [phone]   x 2   CHOLECYSTECTOMY  2003   COLONOSCOPY WITH PROPOFOL N/A 06/05/2019   Procedure: COLONOSCOPY WITH PROPOFOL;  Surgeon: Carol Ada, MD;  [facility];  Service: Endoscopy;  Laterality: N/A;   CYST REMOVAL WITH BONE GRAFT Left 10/18/2020   Procedure: BONE GRAFTING OF ENCHONDROMA MIDDLE [place] OF LEFT MIDDLE FINGER;  Surgeon: Daryll Brod, MD;  Location: [place];  Service: Orthopedics;  Laterality: Left;  AXILLARY BLOCK   DIAGNOSTIC LAPAROSCOPY  2015; 2017   lap hernia repair x2   endoscopy     ENDOVENOUS ABLATION SAPHENOUS VEIN W/ LASER Left 08/31/2021   endovenous laser ablation left greater saphenous vein and stab phlebectomy 10-20 incisions left leg by Servando Snare MD   [place] Right 05/28/2022   Procedure: HALLUX FUSION METATARSAL PHALANGEAL JOIT;  Surgeon: [name], DPM;  Location: [place];  Service: Podiatry;  Laterality: Right;  BLOCK   HERNIA REPAIR  [phone], [phone]   umbilical hernia repair   IVC FILTER INSERTION  2003   Hx pulmonary embolus   POLYPECTOMY  06/05/2019   Procedure: POLYPECTOMY;  Surgeon: Carol Ada, MD;  [facility];  Service: Endoscopy;;  SHOULDER ARTHROSCOPY W/ ROTATOR CUFF REPAIR  08/28/2017   right shoulder   WRIST ARTHROSCOPY WITH DEBRIDEMENT Left 10/18/2020   Procedure: LEFT WRIST ARTHROSCOPY WITH DEBRIDEMENT;  Surgeon: Daryll Brod, MD;  Location: [place];  Service: Orthopedics;  Laterality: Left;  AXILLARY BLOCK   WRIST ARTHROSCOPY WITH DEBRIDEMENT Right 01/25/2022   Procedure: ARTHROSCOPY RIGHT WRIST WITH  DEBRIDEMENT/ SHRINKAGE;  Surgeon: Leanora Cover, MD;  Location: [place];  Service: Orthopedics;  Laterality: Right;    Current Medications: No outpatient medications have been marked as taking for the 08/20/22 encounter (Appointment) with Haelee Bolen, Yetta Barre, MD.     Allergies:   Acetaminophen-codeine, Bee venom,  Meloxicam, Tramadol, Codeine, Coconut (cocos nucifera), and Tomato   Social History   Socioeconomic History   Marital status: Divorced    Spouse name: Not on file   Number of children: 3   Years of education: Not on file   Highest education level: Not on file  Occupational History   Not on file  Tobacco Use   Smoking status: Never   Smokeless tobacco: Never  Vaping Use   Vaping Use: Never used  Substance and Sexual Activity   Alcohol use: Never   Drug use: Never   Sexual activity: Not on file    Comment: Hysterectomy  Other Topics Concern   Not on file  Social History Narrative   ** Merged History Encounter **       Social Determinants of Health   Financial Resource Strain: Not on file  Food Insecurity: Not on file  Transportation Needs: Not on file  Physical Activity: Not on file  Stress: Not on file  Social Connections: Not on file     Family History: The patient's family history includes Breast cancer in her maternal grandmother and mother; Congestive Heart Failure in her father; Diabetes in her father; Heart attack in her father.  ROS:   Please see the history of present illness.    All other systems reviewed and are negative.  EKGs/Labs/Other Studies Reviewed Today:     TTE 07/2022: Normal EF, mildly dilated LA  EKG:  Last EKG results: today - Atrial flutter with variable conduction   Recent Labs: 11/27/2021: ALT 14 07/27/2022: BUN 18; Creatinine, Ser 1.03; Hemoglobin 12.3; Platelets 291; Potassium 4.6; Sodium 144     Physical Exam:    VS:  There were no vitals taken for this visit.    Wt Readings from Last 3 Encounters:  07/19/22 300 lb (136.1 kg)  07/02/22 (!) 307 lb (139.3 kg)  06/14/22 288 lb (130.6 kg)     GEN:  Well nourished, well developed in no acute distress, obese CARDIAC: Irregular rhythm, no murmurs, rubs, gallops RESPIRATORY:  Normal work of breathing MUSCULOSKELETAL: 1+ edema    ASSESSMENT & PLAN:    Atrial flutter:  multiple recurrences resulting in hospitalization and CHF. I recommended ablation. We discussed the indication, rationale, logistics, anticipated benefits, and potential risks of the ablation procedure including but not limited to -- bleed at the groin access site, chest pain, damage to nearby organs such as the diaphragm, lungs, or esophagus, need for a drainage tube, or prolonged hospitalization. I explained that the risk for stroke, heart attack, need for open chest surgery, or even death is very low but not zero. she  expressed understanding and wishes to proceed.  Atrial fibrillation: not documented on ECG since 2020. Will continue to monitor after AF ablation Longterm use of anticoagulation: history of recurrent PE and stroke. She will  be on life-long anticoagulation        Medication Adjustments/Labs and Tests Ordered: Current medicines are reviewed at length with the patient today.  Concerns regarding medicines are outlined above.  No orders of the defined types were placed in this encounter.  No orders of the defined types were placed in this encounter.    Signed, Melida Quitter, MD  08/20/2022 9:55 AM    [place]

## 2022-08-20 NOTE — Patient Instructions (Signed)
Medication Instructions:  Your physician recommends that you continue on your current medications as directed. Please refer to the Current Medication list given to you today.  *If you need a refill on your cardiac medications before your next appointment, please call your pharmacy*  Lab Work: BMET and CBC (see instruction letter)  Testing/Procedures: Your physician has recommended that you have an ablation. Catheter ablation is a medical procedure used to treat some cardiac arrhythmias (irregular heartbeats). During catheter ablation, a long, thin, flexible tube is put into a blood vessel in your groin (upper thigh), or neck. This tube is called an ablation catheter. It is then guided to your heart through the blood vessel. Radio frequency waves destroy small areas of heart tissue where abnormal heartbeats may cause an arrhythmia to start. Please see the instruction sheet given to you today.  Follow-Up: At [facility], you and your health needs are our priority.  As part of our continuing mission to provide you with exceptional heart care, we have created designated Provider Care Teams.  These Care Teams include your primary Cardiologist (physician) and Advanced Practice Providers (APPs -  Physician Assistants and Nurse Practitioners) who all work together to provide you with the care you need, when you need it.  Your next appointment:   See instruction letter  Important Information About [place]

## 2022-08-21 NOTE — Anesthesia Preprocedure Evaluation (Signed)
Anesthesia Evaluation  Patient identified by MRN, date of birth, ID band Patient awake    Reviewed: Allergy & Precautions, NPO status , Patient's Chart, lab work & pertinent test results, reviewed documented beta blocker date and time   Airway Mallampati: II  TM Distance: >3 FB Neck ROM: Full    Dental  (+) Dental Advisory Given, Teeth Intact, Missing   Pulmonary asthma , sleep apnea , pneumonia   Pulmonary exam normal breath sounds clear to auscultation       Cardiovascular +CHF  + dysrhythmias Atrial Fibrillation  Rhythm:Irregular Rate:Normal  Echo 07/2022  1. Left ventricular ejection fraction, by estimation, is 60 to 65%. The left ventricle has normal function. The left ventricle has no regional wall motion abnormalities. There is moderate left ventricular hypertrophy. Left ventricular diastolic parameters are indeterminate.   2. Right ventricular systolic function is normal. The right ventricular size is normal.   3. Left atrial size was mildly dilated.   4. The mitral valve is normal in structure. Trivial mitral valve regurgitation. No evidence of mitral stenosis.   5. The aortic valve is tricuspid. Aortic valve regurgitation is not visualized. No aortic stenosis is present.   6. The inferior vena cava is normal in size with greater than 50% respiratory variability, suggesting right atrial pressure of 3 mmHg.      Neuro/Psych Seizures -,  PSYCHIATRIC DISORDERS Anxiety Depression     Neuromuscular disease CVA    GI/Hepatic Neg liver ROS, hiatal hernia,GERD  ,,  Endo/Other  diabetes  Morbid obesity  Renal/GU negative Renal ROS     Musculoskeletal  (+) Arthritis ,    Abdominal  (+) + obese  Peds  Hematology negative hematology ROS (+)   Anesthesia Other Findings   Reproductive/Obstetrics                             Anesthesia Physical Anesthesia Plan  ASA: 3  Anesthesia Plan: General    Post-op Pain Management: Minimal or no pain anticipated   Induction:   PONV Risk Score and Plan: 3 and Treatment may vary due to age or medical condition, Propofol infusion and TIVA  Airway Management Planned: Mask  Additional Equipment:   Intra-op Plan:   Post-operative Plan:   Informed Consent: I have reviewed the patients History and Physical, chart, labs and discussed the procedure including the risks, benefits and alternatives for the proposed anesthesia with the patient or authorized representative who has indicated his/her understanding and acceptance.     Dental advisory given  Plan Discussed with: CRNA  Anesthesia Plan Comments:        Anesthesia Quick Evaluation

## 2022-08-22 ENCOUNTER — Encounter (HOSPITAL_COMMUNITY): Payer: Self-pay | Admitting: Cardiology

## 2022-08-22 ENCOUNTER — Ambulatory Visit (HOSPITAL_COMMUNITY): Payer: Medicare Other | Admitting: Anesthesiology

## 2022-08-22 ENCOUNTER — Encounter (HOSPITAL_COMMUNITY)
Admission: RE | Disposition: A | Discharge status: Home / Self Care | Payer: Self-pay | Source: Home / Self Care | Attending: Cardiology

## 2022-08-22 ENCOUNTER — Ambulatory Visit (HOSPITAL_BASED_OUTPATIENT_CLINIC_OR_DEPARTMENT_OTHER): Payer: Medicare Other | Admitting: Anesthesiology

## 2022-08-22 ENCOUNTER — Ambulatory Visit (HOSPITAL_COMMUNITY)
Admission: RE | Admit: 2022-08-22 | Discharge: 2022-08-22 | Disposition: A | Discharge status: Home / Self Care | Payer: Medicare Other | Attending: Cardiology | Admitting: Cardiology

## 2022-08-22 DIAGNOSIS — I4892 Unspecified atrial flutter: Secondary | ICD-10-CM | POA: Diagnosis not present

## 2022-08-22 DIAGNOSIS — G4730 Sleep apnea, unspecified: Secondary | ICD-10-CM

## 2022-08-22 DIAGNOSIS — Z8249 Family history of ischemic heart disease and other diseases of the circulatory system: Secondary | ICD-10-CM | POA: Insufficient documentation

## 2022-08-22 DIAGNOSIS — Z6841 Body Mass Index (BMI) 40.0 and over, adult: Secondary | ICD-10-CM | POA: Diagnosis not present

## 2022-08-22 DIAGNOSIS — Z8673 Personal history of transient ischemic attack (TIA), and cerebral infarction without residual deficits: Secondary | ICD-10-CM | POA: Insufficient documentation

## 2022-08-22 DIAGNOSIS — G40909 Epilepsy, unspecified, not intractable, without status epilepticus: Secondary | ICD-10-CM | POA: Diagnosis not present

## 2022-08-22 DIAGNOSIS — K219 Gastro-esophageal reflux disease without esophagitis: Secondary | ICD-10-CM | POA: Diagnosis not present

## 2022-08-22 DIAGNOSIS — Z8619 Personal history of other infectious and parasitic diseases: Secondary | ICD-10-CM | POA: Diagnosis not present

## 2022-08-22 DIAGNOSIS — J45909 Unspecified asthma, uncomplicated: Secondary | ICD-10-CM

## 2022-08-22 DIAGNOSIS — Z7901 Long term (current) use of anticoagulants: Secondary | ICD-10-CM | POA: Insufficient documentation

## 2022-08-22 DIAGNOSIS — Z95828 Presence of other vascular implants and grafts: Secondary | ICD-10-CM | POA: Insufficient documentation

## 2022-08-22 DIAGNOSIS — I509 Heart failure, unspecified: Secondary | ICD-10-CM | POA: Diagnosis not present

## 2022-08-22 DIAGNOSIS — Z833 Family history of diabetes mellitus: Secondary | ICD-10-CM | POA: Diagnosis not present

## 2022-08-22 DIAGNOSIS — E119 Type 2 diabetes mellitus without complications: Secondary | ICD-10-CM | POA: Insufficient documentation

## 2022-08-22 DIAGNOSIS — Z86711 Personal history of pulmonary embolism: Secondary | ICD-10-CM | POA: Insufficient documentation

## 2022-08-22 DIAGNOSIS — I4891 Unspecified atrial fibrillation: Secondary | ICD-10-CM | POA: Diagnosis present

## 2022-08-22 HISTORY — PX: CARDIOVERSION: SHX1299

## 2022-08-22 LAB — GLUCOSE, CAPILLARY: Glucose-Capillary: 80 mg/dL (ref 70–99)

## 2022-08-22 SURGERY — CARDIOVERSION
Anesthesia: General

## 2022-08-22 MED ORDER — SODIUM CHLORIDE 0.9 % IV SOLN: INTRAVENOUS | Status: DC

## 2022-08-22 MED ORDER — PROPOFOL 10 MG/ML IV BOLUS
INTRAVENOUS | Status: DC | PRN
  Administered 2022-08-22: 100 mg via INTRAVENOUS

## 2022-08-22 MED ORDER — LIDOCAINE 2% (20 MG/ML) 5 ML SYRINGE
INTRAMUSCULAR | Status: DC | PRN
  Administered 2022-08-22: 100 mg via INTRAVENOUS

## 2022-08-22 NOTE — CV Procedure (Signed)
   Electrical Cardioversion Procedure Note Meghan [name] [id number] 11/24/1965  Procedure: Electrical Cardioversion Indications:  Atrial Fibrillation  Time Out: Verified patient identification, verified procedure,medications/allergies/relevent history reviewed, required imaging and test results available.  Performed  Procedure Details  The patient signed informed consent.   The patient was NPO past midnight. Has had therapeutic anticoagulation with Xeralto greater than 3 weeks. The patient denies any interruption of anticoagulation.  Anesthesia was administered by the aneasthiologist.  Adequate airway was maintained throughout and vital followed per protocol.  He was cardioverted x 1 with 200 J of biphasic synchronized energy.  He converted to NSR.  There were no apparent complications.  The patient tolerated the procedure well and had normal neuro status and respiratory status post procedure with vitals stable as recorded elsewhere.     IMPRESSION:  Successful cardioversion of atrial fibrillation   Follow up:  We will arrange follow up with Dr Myles Gip.  He will continue on current medical therapy.  The patient advised to continue anticoagulation.  Meghan [name] 08/22/2022, 9:39 AM

## 2022-08-22 NOTE — Discharge Instructions (Signed)

## 2022-08-22 NOTE — Anesthesia Procedure Notes (Signed)
Procedure Name: General with mask airway Date/Time: 08/22/2022 9:21 AM  Performed by: Dorann Lodge, CRNAPre-anesthesia Checklist: Emergency Drugs available, Patient identified, Suction available and Patient being monitored Patient Re-evaluated:Patient Re-evaluated prior to induction Oxygen Delivery Method: Ambu bag Preoxygenation: Pre-oxygenation with 100% oxygen Induction Type: IV induction Dental Injury: Teeth and Oropharynx as per pre-operative assessment

## 2022-08-22 NOTE — Interval H&P Note (Signed)
History and Physical Interval Note:  08/22/2022 8:51 AM  Meghan [name]  has presented today for surgery, with the diagnosis of AFLUTTER.  The various methods of treatment have been discussed with the patient and family. After consideration of risks, benefits and other options for treatment, the patient has consented to  Procedure(s): CARDIOVERSION (N/A) as a surgical intervention.  The patient's history has been reviewed, patient examined, no change in status, stable for surgery.  I have reviewed the patient's chart and labs.  Questions were answered to the patient's satisfaction.     [name]

## 2022-08-22 NOTE — Transfer of Care (Signed)
Immediate Anesthesia Transfer of Care Note  Patient: Meghan [name]  Procedure(s) Performed: CARDIOVERSION  Patient Location: Endoscopy Unit  Anesthesia Type:General  Level of Consciousness: awake and drowsy  Airway & Oxygen Therapy: Patient Spontanous Breathing  Post-op Assessment: Report given to RN and Post -op Vital signs reviewed and stable  Post vital signs: Reviewed and stable  Last Vitals:  Vitals Value Taken Time  BP 123/103   Temp    Pulse 85   Resp 24   SpO2 98     Last Pain:  Vitals:   08/22/22 0839  TempSrc: Temporal  PainSc: 0-No pain         Complications: No notable events documented.

## 2022-08-23 ENCOUNTER — Ambulatory Visit (INDEPENDENT_AMBULATORY_CARE_PROVIDER_SITE_OTHER): Payer: Medicare Other | Admitting: Licensed Clinical Social Worker

## 2022-08-23 DIAGNOSIS — F411 Generalized anxiety disorder: Secondary | ICD-10-CM | POA: Diagnosis not present

## 2022-08-23 DIAGNOSIS — F4310 Post-traumatic stress disorder, unspecified: Secondary | ICD-10-CM

## 2022-08-23 DIAGNOSIS — F331 Major depressive disorder, recurrent, moderate: Secondary | ICD-10-CM | POA: Diagnosis not present

## 2022-08-23 NOTE — Anesthesia Postprocedure Evaluation (Signed)
Anesthesia Post Note  Patient: Meghan [name]  Procedure(s) Performed: CARDIOVERSION     Patient location during evaluation: PACU Anesthesia Type: General Level of consciousness: sedated and patient cooperative Pain management: pain level controlled Vital Signs Assessment: post-procedure vital signs reviewed and stable Respiratory status: spontaneous breathing Cardiovascular status: stable Anesthetic complications: no   No notable events documented.  Last Vitals:  Vitals:   08/22/22 0940 08/22/22 0950  BP: 120/84 104/74  Pulse: 80 81  Resp: 19 (!) 22  Temp:    SpO2: 97% 96%    Last Pain:  Vitals:   08/22/22 0950  TempSrc:   PainSc: 0-No pain                 Nolon Nations

## 2022-08-23 NOTE — Progress Notes (Signed)
Virtual Visit via Video Note   I connected with Meghan [name] on 08/23/22 at 3:00pm by video enabled telemedicine application and verified that I am speaking with the correct person using two identifiers.   I discussed the limitations, risks, security and privacy concerns of performing an evaluation and management service by video and the availability of in person appointments. I also discussed with the patient that there may be a patient responsible charge related to this service. The patient expressed understanding and agreed to proceed.   I discussed the assessment and treatment plan with the patient. The patient was provided an opportunity to ask questions and all were answered. The patient agreed with the plan and demonstrated an understanding of the instructions.   The patient was advised to call back or seek an in-person evaluation if the symptoms worsen or if the condition fails to improve as anticipated.   I provided 1 hour of non-face-to-face time during this encounter.     [name], LCSW, LCAS _______________________ THERAPIST PROGRESS NOTE   Session Time:  3:00pm - 4:00pm     Location: Patient: Patient Home Provider: [facility] [place] [facility]     Participation Level: Active    Behavioral Response: Alert, casually dressed, euthymic mood/affect     Type of Therapy:  Individual Therapy   Treatment Goals addressed: Mood management; Medication management   Progress Towards Goals: Progressing    Interventions: CBT: developing a mental health maintenance plan     Summary: Meghan [name] is a 56 year old divorced African American female that presented for therapy appointment today with diagnoses of Major Depressive Disorder, recurrent, moderate, Generalized Anxiety Disorder and PTSD.      Suicidal/Homicidal: None; without plan or intent                                                                                                                Therapist Response:  Clinician met with  Meghan [name] for virtual therapy session and assessed for safety, sobriety, and medication compliance.  Meghan [name] presented for appointment on time and was alert, oriented x5, with no evidence or self-report of active SI/HI or A/V H.  Meghan [name] reported ongoing compliance with medication and denied any use of alcohol or illicit substances.  Clinician inquired about Meghan [name]'s emotional ratings today, as well as any significant changes in thoughts, feelings, or behavior since last check-in.  Meghan [name] reported scores of 0/10 for depression, 0/10 for anxiety, and 0/10 for anger/irritability.  Meghan [name] denied any recent outbursts.  Meghan [name] reported that a recent struggle was having to go to the hospital yesterday to have a heart procedure, which triggered 2 separate panic attacks.  She reported that she had also been sleeping poorly ahead of this date.  Meghan [name] reported that the procedure was a success despite her worries, stating "They were able to shock my heart back into rhythm and it had me thinking 'Really, that was it?"  She reported that another procedure is scheduled in February where they will put a device in an  artery to address a blockage. Meghan [name] reported that her doctor has recommended that she avoid getting stressed over the next few months since this could take a toll on her heart and reverse progress made through procedure.  Clinician introduced topic of creating mental health maintenance plan today.  Clinician shared a virtual handout with Meghan [name], which stressed the importance of maintaining one's mental health in a similar way to using diet and exercise to ensure physical health.  Clinician walked Meghan [name] through process of identifying triggers which could worsen symptoms, including specific people, places, and things one needs to avoid.  She was also tasked with identifying warning signs such as thoughts, feelings, or behaviors which could indicate mental health is at increased risk.  Clinician also facilitated conversation on  self-care activities and coping strategies which she had previously utilized in the past, is currently using in daily routine, or plans to use soon to assist with managing problems or symptoms when/if they appear.  Clinician encouraged Meghan [name] to revisit her maintenance plan often and make changes as needed to ensure day to day stability.  Intervention was effective, as evidenced by Meghan [name] successfully completing mental health maintenance plan, including identification of triggers to limit exposure to such as stress involving her kids, reminders of grief, or warning signs that could suggest an impending crisis, such as isolation from supports, chest pain, and increased panic attacks.  She reported that she would plan to focus more on self-care activities such as volunteering for church activities or working on her book, in addition to practicing coping skills each day such as deep breathing and positive visualization.  Clinician will continue to monitor.    Plan: Follow up again in 1 week.    Diagnosis: Major depressive disorder, recurrent, moderate; Generalized Anxiety Disorder; and PTSD   Collaboration of Care:   No collaboration of care required for this visit.                                                   Patient/Guardian was advised Release of Information must be obtained prior to any record release in order to collaborate their care with an outside provider. Patient/Guardian was advised if they have not already done so to contact the registration department to sign all necessary forms in order for [name] to release information regarding their care.    Consent: Patient/Guardian gives verbal consent for treatment and assignment of benefits for services provided during this visit. Patient/Guardian expressed understanding and agreed to proceed.  [name], [place], LCAS 08/23/22

## 2022-08-26 ENCOUNTER — Encounter (HOSPITAL_COMMUNITY): Payer: Self-pay | Admitting: Cardiology

## 2022-09-05 ENCOUNTER — Ambulatory Visit (INDEPENDENT_AMBULATORY_CARE_PROVIDER_SITE_OTHER): Payer: Medicare Other | Admitting: Licensed Clinical Social Worker

## 2022-09-05 DIAGNOSIS — F4310 Post-traumatic stress disorder, unspecified: Secondary | ICD-10-CM | POA: Diagnosis not present

## 2022-09-05 DIAGNOSIS — F331 Major depressive disorder, recurrent, moderate: Secondary | ICD-10-CM | POA: Diagnosis not present

## 2022-09-05 DIAGNOSIS — F411 Generalized anxiety disorder: Secondary | ICD-10-CM | POA: Diagnosis not present

## 2022-09-05 NOTE — Progress Notes (Signed)
Virtual Visit via Video Note   I connected with Jaymes S. Glazebrook on 09/05/22 at 3:00pm by video enabled telemedicine application and verified that I am speaking with the correct person using two identifiers.   I discussed the limitations, risks, security and privacy concerns of performing an evaluation and management service by video and the availability of in person appointments. I also discussed with the patient that there may be a patient responsible charge related to this service. The patient expressed understanding and agreed to proceed.   I discussed the assessment and treatment plan with the patient. The patient was provided an opportunity to ask questions and all were answered. The patient agreed with the plan and demonstrated an understanding of the instructions.   The patient was advised to call back or seek an in-person evaluation if the symptoms worsen or if the condition fails to improve as anticipated.   I provided 39 minutes of non-face-to-face time during this encounter.     [name], LCSW, LCAS _______________________ THERAPIST PROGRESS NOTE   Session Time:  3:00pm - 3:39pm      Location: Patient: Patient Home Provider: Clinical Home Office     Participation Level: Active    Behavioral Response: Alert, casually dressed, euthymic mood/affect     Type of Therapy:  Individual Therapy   Treatment Goals addressed: Mood management; Medication management   Progress Towards Goals: Progressing    Interventions: CBT: crisis planning    Summary: Yeraldin S. Redondo is a 56 year old divorced African American female that presented for therapy appointment today with diagnoses of Major Depressive Disorder, recurrent, moderate, Generalized Anxiety Disorder and PTSD.      Suicidal/Homicidal: None; without plan or intent                                                                                                                Therapist Response:  Clinician met with Abbigail for  virtual therapy appointment and assessed for safety, sobriety, and medication compliance.  Javaeh presented for session on time and was alert, oriented x5, with no evidence or self-report of active SI/HI or A/V H.  Doneisha reported ongoing compliance with medication and denied any use of alcohol or illicit substances.  Clinician inquired about Brelyn's current emotional ratings, as well as any significant changes in thoughts, feelings, or behavior since previous check-in.  Rashida reported scores of 0/10 for depression, 0/10 for anxiety, and 0/10 for anger/irritability.  Keora denied any recent outbursts or panic attacks.  Siara reported that a recent success was celebrating her birthday today, which involved going out to eat with a friend, and receiving several calls and texts from family and friends.  She reported that a struggle was receiving numerous back to back distressing calls in one day last week, which almost caused a crisis, stating "I thought I was going to have to break down and call you".  Clinician suggested reviewing crisis planning procedures with [place] based upon this recent challenge, and she was agreeable to this.  Clinician  went through several components of crisis planning with May, including identification of signs that could hint of an impending crisis building, triggers (internal and external) that could negatively influence her mood, personal coping skills that could be implemented to calm herself down, supportive family or friends she could outreach for assistance if needed, as well as professional sources, including nearby behavioral hospitals she could visit for medical intervention if necessary.  Intervention was effective, as evidenced by [name] actively engaging in crisis planning process, reporting that the triggers which caused emotional stress that day all involved being informed of injury or threat of illness to family members she care dearly about, which led her anxiety to rise, along  with racing thoughts and panic symptoms.  Joceline reported that she was able to calm herself by utilizing deep breathing, prayer, and a phone call to her sister as a last resort in order to establish a realistic plan.  Makhayla reported that she was proud of herself for keeping her composure, and would continue to refine crisis plan with assistance from clinician as she faces new challenges.  Clinician will continue to monitor.    Plan: Follow up again in 1 week.    Diagnosis: Major depressive disorder, recurrent, moderate; Generalized Anxiety Disorder; and PTSD   Collaboration of Care:   No collaboration of care required for this visit.                                                   Patient/Guardian was advised Release of Information must be obtained prior to any record release in order to collaborate their care with an outside provider. Patient/Guardian was advised if they have not already done so to contact the registration department to sign all necessary forms in order for [name] to release information regarding their care.    Consent: Patient/Guardian gives verbal consent for treatment and assignment of benefits for services provided during this visit. Patient/Guardian expressed understanding and agreed to proceed.  [name], [place], LCAS 09/05/22

## 2022-09-07 ENCOUNTER — Ambulatory Visit (INDEPENDENT_AMBULATORY_CARE_PROVIDER_SITE_OTHER): Payer: Medicare Other | Admitting: Podiatry

## 2022-09-07 ENCOUNTER — Ambulatory Visit: Payer: Medicare Other

## 2022-09-07 DIAGNOSIS — M792 Neuralgia and neuritis, unspecified: Secondary | ICD-10-CM

## 2022-09-07 DIAGNOSIS — M722 Plantar fascial fibromatosis: Secondary | ICD-10-CM | POA: Diagnosis not present

## 2022-09-07 MED ORDER — PREGABALIN 100 MG PO CAPS: 100.0000 mg | ORAL_CAPSULE | Freq: Two times a day (BID) | ORAL | 0 refills | Status: DC

## 2022-09-07 NOTE — Progress Notes (Signed)
Subjective:  Patient ID: Meghan [name], female    DOB: 09/21/1965,  MRN: [id number]  Chief Complaint  Patient presents with   Routine Post Op    DOS: 05/28/2022 Procedure: Right partial metatarsophalangeal joint fusion  56 y.o. female returns for post-op check.  Patient presents with complaint of neuritis to the left foot.  She states the numbness tingling she is is 1 get it evaluated she is scheduled for surgery in March.  Review of Systems: Negative except as noted in the HPI. Denies N/V/F/Ch.  Past Medical History:  Diagnosis Date   Anxiety    Arthritis    left knee   Asthma    Atrial fibrillation ([place]) 02/16/2019   CHF (congestive heart failure) ([place]) 01/2019   Physician from [place] informed her she had this    Diabetes mellitus without complication ([place])    Dysrhythmia    Atrial fibrillation   GERD (gastroesophageal reflux disease)    Hiatal hernia    History of pulmonary embolus ([place])    Hypercholesterolemia    Macular degeneration    of left eye   MDD (major depressive disorder)    Osteoarthritis    knees   Pneumonia    in the past   PTSD (post-traumatic stress disorder)    Pulmonary embolism ([place])    bilateral, none since 2003   Seizure disorder ([place])    Per patient last seizure was in 2020.   Sleep apnea CPAP   [facility]  Uses CPAP   Stroke ([place])    1989 and 1995 (left sided weakness)   Vitamin D deficiency     Current Outpatient Medications:    pregabalin (LYRICA) 100 MG capsule, Take 1 capsule (100 mg total) by mouth 2 (two) times daily., Disp: 60 capsule, Rfl: 0   acetaminophen (TYLENOL) 500 MG tablet, Take 1,000 mg by mouth every 6 (six) hours as needed for moderate pain., Disp: , Rfl:    albuterol (VENTOLIN HFA) 108 (90 Base) MCG/ACT inhaler, Inhale 2 puffs into the lungs every 6 (six) hours as needed for wheezing or shortness of breath., Disp: , Rfl:    atorvastatin (LIPITOR) 40 MG tablet, Take 40 mg by mouth every evening., Disp: , Rfl:    BREZTRI  AEROSPHERE 160-9-4.8 MCG/ACT AERO, Inhale 2 puffs into the lungs in the morning and at bedtime. , Disp: , Rfl:    bumetanide (BUMEX) 1 MG tablet, Take 1 mg by mouth daily., Disp: , Rfl:    Cholecalciferol (VITAMIN D-3) 125 MCG (5000 UT) TABS, Take 5,000 Units by mouth daily., Disp: , Rfl:    diphenhydrAMINE HCl, Sleep, (ZZZQUIL) 50 MG/30ML LIQD, Take 30 mLs by mouth at bedtime as needed (sleep)., Disp: , Rfl:    flecainide (TAMBOCOR) 50 MG tablet, Take 50 mg by mouth 2 (two) times daily. , Disp: , Rfl:    gabapentin (NEURONTIN) 300 MG capsule, Take 1 capsule (300 mg total) by mouth 3 (three) times daily., Disp: 90 capsule, Rfl: 3   hydrOXYzine (ATARAX) 25 MG tablet, Take 1-2 tablets at bedtime for insomnia, Disp: 45 tablet, Rfl: 1   levETIRAcetam (KEPPRA) 500 MG tablet, Take 1 tablet (500 mg total) by mouth 2 (two) times daily., Disp: 180 tablet, Rfl: 3   Menthol-Camphor (TIGER BALM ARTHRITIS RUB) 11-11 % CREA, Apply 1 application  topically daily as needed (Knee pain)., Disp: , Rfl:    metFORMIN (GLUCOPHAGE) 500 MG tablet, Take 500 mg by mouth daily with breakfast., Disp: , Rfl:  metoprolol succinate (TOPROL-XL) 25 MG 24 hr tablet, Take 1 tablet (25 mg total) by mouth daily. Take with or immediately following a meal., Disp: 90 tablet, Rfl: 3   neomycin-polymyxin b-dexamethasone (MAXITROL) 3.5-10000-0.1 SUSP, Place 1 drop into both eyes., Disp: , Rfl:    Omega-3 Fatty Acids (FISH OIL) 1200 MG CAPS, Take 1,200 mg by mouth daily with breakfast., Disp: , Rfl:    omeprazole (PRILOSEC) 40 MG capsule, Take 40 mg by mouth daily., Disp: , Rfl:    rivaroxaban (XARELTO) 20 MG TABS tablet, Take 20 mg by mouth at bedtime. , Disp: , Rfl:    RYBELSUS 14 MG TABS, Take 14 mg by mouth daily., Disp: , Rfl:    sertraline (ZOLOFT) 50 MG tablet, Take 50 mg by mouth daily., Disp: , Rfl:   Social History   Tobacco Use  Smoking Status Never  Smokeless Tobacco Never    Allergies  Allergen Reactions    Acetaminophen-Codeine Swelling, Rash and Other (See Comments)    Tylenol with Codeine, Tylenol #3,  (facial swelling, hives)   Bee Venom Anaphylaxis   Meloxicam Anaphylaxis and Rash   Tramadol Hives and Other (See Comments)    Patient stated "I was trippin' and I do not want that ever again"   Codeine Swelling    hives   Coconut (Cocos Nucifera) Rash and Other (See Comments)    ANY coconut products    Tomato Rash    Fresh    Objective:  There were no vitals filed for this visit. There is no height or weight on file to calculate BMI. Constitutional Well developed. Well nourished.  Vascular Foot warm and well perfused. Capillary refill normal to all digits.   Neurologic Normal speech. Oriented to person, place, and time. Epicritic sensation to light touch grossly present bilaterally.  Numbness tingling noted to the left foot generalized.  No specific dermatome noted  Dermatologic Skin completely reepithelialized.  No signs of Deis is noted no complication noted.  No pain on palpation  Orthopedic: Pain on palpation left first metatarsophalangeal joint deep intra-articular pain noted pain and crepitus noted.  No pain at the sesamoidal complex.  Pain on palpation of first MPJ.   Radiographs: 3 views of skeletally mature the right foot: Hardware is intact no signs of backing or loosening noted.  Osseous bridging/consolidated noted at the first metatarsophalangeal joint good position alignment noted.  No signs of fractures or breakdown of hardware noted Assessment:   1. Neuritis     Plan:  Patient was evaluated and treated and all questions answered.  S/p foot surgery right -Clinically healed and doing well.  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  Given the amount of pain that she is experiencing the joint is in the setting of a bunion deformity I believe patient  will benefit from best from MPJ fusion to the left side.  She states she is doing fine on the right side and would like to proceed with the same procedure on the left side.  I discussed my preoperative intraoperative postoperative plan in extensive detail she states understanding like to proceed with surgery. -Informed surgical risk consent was reviewed and read aloud to the patient.  I reviewed the films.  I have discussed my findings with the patient in great detail.  I have discussed all risks including but not limited to infection, stiffness, scarring, limp, disability, deformity, damage to blood vessels  and nerves, numbness, poor healing, need for braces, arthritis, chronic pain, amputation, death.  All benefits and realistic expectations discussed in great detail.  I have made no promises as to the outcome.  I have provided realistic expectations.  I have offered the patient a 2nd opinion, which they have declined and assured me they preferred to proceed despite the risks Were just  Left neuritis - The patient the etiology of neuritis number is treatment options were discussed.  Given the amount of numbness tingling she is noticing patient will benefit from Lyrica Lyrica was sent to the pharmacy.  She is scheduled for surgery next year.   No follow-ups on file.

## 2022-09-20 DIAGNOSIS — Z6841 Body Mass Index (BMI) 40.0 and over, adult: Secondary | ICD-10-CM | POA: Insufficient documentation

## 2022-09-20 DIAGNOSIS — I679 Cerebrovascular disease, unspecified: Secondary | ICD-10-CM | POA: Insufficient documentation

## 2022-09-24 ENCOUNTER — Ambulatory Visit (INDEPENDENT_AMBULATORY_CARE_PROVIDER_SITE_OTHER): Payer: Medicare Other | Admitting: Licensed Clinical Social Worker

## 2022-09-24 DIAGNOSIS — F331 Major depressive disorder, recurrent, moderate: Secondary | ICD-10-CM

## 2022-09-24 DIAGNOSIS — F411 Generalized anxiety disorder: Secondary | ICD-10-CM | POA: Diagnosis not present

## 2022-09-24 DIAGNOSIS — F4310 Post-traumatic stress disorder, unspecified: Secondary | ICD-10-CM | POA: Diagnosis not present

## 2022-09-24 NOTE — Progress Notes (Signed)
Virtual Visit via Video Note   I connected with Meghan [name] on 09/24/22 at 2:00pm by video enabled telemedicine application and verified that I am speaking with the correct person using two identifiers.   I discussed the limitations, risks, security and privacy concerns of performing an evaluation and management service by video and the availability of in person appointments. I also discussed with the patient that there may be a patient responsible charge related to this service. The patient expressed understanding and agreed to proceed.   I discussed the assessment and treatment plan with the patient. The patient was provided an opportunity to ask questions and all were answered. The patient agreed with the plan and demonstrated an understanding of the instructions.   The patient was advised to call back or seek an in-person evaluation if the symptoms worsen or if the condition fails to improve as anticipated.   I provided 1 hour of non-face-to-face time during this encounter.     [name], LCSW, LCAS _______________________ THERAPIST PROGRESS NOTE   Session Time:  2:00pm - 3:00pm       Location: Patient: Patient Home Provider: Clinical Home Office    Participation Level: Active    Behavioral Response: Alert, casually dressed, euthymic mood/affect     Type of Therapy:  Individual Therapy   Treatment Goals addressed: Mood management; Medication management; Maintaining exercise routine  Progress Towards Goals: Progressing    Interventions: CBT, psychoeducation on SMART goals   Summary: Meghan [name] is a 57 year old divorced African American female that presented for therapy appointment today with diagnoses of Major Depressive Disorder, recurrent, moderate, Generalized Anxiety Disorder and PTSD.      Suicidal/Homicidal: None; without plan or intent                                                                                                                Therapist Response:   Clinician met with Meghan [name] for virtual therapy session and assessed for safety, sobriety, and medication compliance.  Meghan [name] presented for appointment on time and was alert, oriented x5, with no evidence or self-report of active SI/HI or A/V H.  Meghan [name] reported ongoing compliance with medication and denied any use of alcohol or illicit substances.  Clinician inquired about Meghan [name]'s emotional ratings today, as well as any significant changes in thoughts, feelings, or behavior since last check-in.  Meghan [name] reported scores of 0/10 for depression, 0/10 for anxiety, and 0/10 for anger/irritability.  Meghan [name] denied any recent outbursts or panic attacks.  Meghan [name] reported that a recent success was enjoying the holidays, which she mostly spent with her sister shopping for family, and taking time to relax afterward.  She reported that she also received some nice gifts, which was surprising and lifted her spirits further.  Meghan [name] reported that the only challenge she has faced recently has been losing weight after a period of inactivity following surgery.  Clinician introduced topic of goal setting today.  Clinician virtually shared a handout on SMART goals to help guide discussion.  This handout explained how using this acronym can help break large goals down into smaller, realistic steps which are more achievable, and went through each section (Specific, Measurable, Attainable, Relevant, and Time Bound) using open ended questions to guide discussion (i.e. What am I going to do?; How will I measure success?; What do I need to do to get started today?; etc).  Clinician tasked Meghan [name] with identifying a primary wellness goal to focus on in upcoming months, using this acronym to clarify steps she can begin taking now, and consider obstacles that may arise, along with helpful tools and resources that could be utilized to increase chance of successful completion.  Intervention was effective, as evidenced by Meghan [name] participating in handout and  reporting that her wellness goals for 2024 are to work on maintaining regular exercise routine and losing weight.  Meghan [name] reported that she will accomplish this by taking at least one 15 minute walk once per week and attending a water aerobics class at the [facility] x3 per week, in addition to following a healthy diet approved by her medical team.  Meghan [name] reported that she hopes that these steps will help her lose roughly 100lbs by 2025 with supervision from her doctors.  Meghan [name] reported that she will consider seeking help from her support network when needed to hold her accountable to this new goal and navigate unforeseen obstacles.  Meghan [name] stated "Its a new year, a new attitude".  Clinician will continue to monitor.    Plan: Follow up again in 1 week.    Diagnosis: Major depressive disorder, recurrent, moderate; Generalized Anxiety Disorder; and PTSD   Collaboration of Care:   No collaboration of care required for this visit.                                                   Patient/Guardian was advised Release of Information must be obtained prior to any record release in order to collaborate their care with an outside provider. Patient/Guardian was advised if they have not already done so to contact the registration department to sign all necessary forms in order for [name] to release information regarding their care.    Consent: Patient/Guardian gives verbal consent for treatment and assignment of benefits for services provided during this visit. Patient/Guardian expressed understanding and agreed to proceed.  [name], [place], LCAS 09/24/22

## 2022-10-01 ENCOUNTER — Ambulatory Visit (INDEPENDENT_AMBULATORY_CARE_PROVIDER_SITE_OTHER): Payer: Medicare Other | Admitting: Licensed Clinical Social Worker

## 2022-10-01 DIAGNOSIS — F411 Generalized anxiety disorder: Secondary | ICD-10-CM

## 2022-10-01 DIAGNOSIS — F4310 Post-traumatic stress disorder, unspecified: Secondary | ICD-10-CM | POA: Diagnosis not present

## 2022-10-01 DIAGNOSIS — F331 Major depressive disorder, recurrent, moderate: Secondary | ICD-10-CM | POA: Diagnosis not present

## 2022-10-01 NOTE — Progress Notes (Signed)
Virtual Visit via Video Note   I connected with Meghan [name] on 10/01/22 at 2:00pm by video enabled telemedicine application and verified that I am speaking with the correct person using two identifiers.   I discussed the limitations, risks, security and privacy concerns of performing an evaluation and management service by video and the availability of in person appointments. I also discussed with the patient that there may be a patient responsible charge related to this service. The patient expressed understanding and agreed to proceed.   I discussed the assessment and treatment plan with the patient. The patient was provided an opportunity to ask questions and all were answered. The patient agreed with the plan and demonstrated an understanding of the instructions.   The patient was advised to call back or seek an in-person evaluation if the symptoms worsen or if the condition fails to improve as anticipated.   I provided 51 minutes of non-face-to-face time during this encounter.     [name], LCSW, LCAS _______________________ THERAPIST PROGRESS NOTE   Session Time:  2:00pm - 2:51pm        Location: Patient: Patient Home Provider: [facility]    Participation Level: Active    Behavioral Response: Alert, casually dressed, euthymic mood/affect     Type of Therapy:  Individual Therapy   Treatment Goals addressed: Mood management; Medication management; maintaining church attendance   Progress Towards Goals: Progressing    Interventions: CBT, problem solving, strengths based    Summary: Meghan [name] is a 57 year old divorced African American female that presented for therapy appointment today with diagnoses of Major Depressive Disorder, recurrent, moderate, Generalized Anxiety Disorder and PTSD.      Suicidal/Homicidal: None; without plan or intent                                                                                                                Therapist  Response:  Clinician met with Meghan [name] for virtual therapy appointment and assessed for safety, sobriety, and medication compliance.  Meghan [name] presented for session on time and was alert, oriented x5, with no evidence or self-report of active SI/HI or A/V H.  Meghan [name] reported ongoing compliance with medication and denied any use of alcohol or illicit substances.  Clinician inquired about Meghan [name]'s current emotional ratings, as well as any significant changes in thoughts, feelings, or behavior since previous check-in.  Meghan [name] reported scores of 0/10 for depression, 0/10 for anxiety, and 0/10 for anger/irritability.  Meghan [name] denied any recent outbursts or panic attacks.  Meghan [name] reported that a recent success has been cleaning up her apartment and packing belongings as she prepares for an upcoming move.  Meghan [name] reported that a recent struggle was having a bad day at church yesterday, stating "Most of the people I talk to, I just waved at them instead.  My friend said 'Somethings wrong because you aren't smiling.  Usually you walk around and speak to people'.  I didn't want to be there".  Clinician inquired about any significant events  lately that might have triggered this change in behavior, including present stressors.  Meghan [name] reported that her deceased father's birthday was yesterday, and she still struggles to open up to her support system about how she is feeling on difficult days like this, particularly when it involves loss.  Clinician encouraged Meghan [name] to be mindful of the impact that unresolved grief can have on one's mental health, and reminded her to consider referrals for addressing this particular issue such as Manufacturing engineer.  Meghan [name] was receptive to this, but reported that another factor in her behavior was recently making the decision to leave the church and transition to a different one in upcoming weeks, stating "I'm tired of sitting in the pews and not doing anything.  God gave me gifts and I've been sitting on  them".  Clinician assisted Meghan [name] in completing cost benefit analysis regarding whether to stay with this church, or seek services elsewhere based upon how this might affect her overall support network.  Meghan [name] participated in analysis, reporting that the outcomes of this decision supported her leaving, as several congregation members actively look for things to gossip about, she cannot relate to the pastor, and the previous extracurricular activities like the soup kitchen are no longer appealing.  Clinician assisted Meghan [name] in looking online at other available churches in her area that align with her particular religion, share similar personal values, and have programs her skills/interests would be well suited for.  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  Meghan [name] reported that she would attend one church service this weekend, and another next week, stating "I need a fresh start somewhere that I can be appreciated.  New year, new me".  Progress is evidenced by Meghan [name]'s consistent attendance to therapy, compliance with medication, and taking steps to stay engaged in a positive spiritual community that supports her mental health.  Clinician will continue to monitor.    Plan: Follow up again in 1 week.    Diagnosis: Major depressive disorder, recurrent, moderate; Generalized Anxiety Disorder; and PTSD   Collaboration of Care:   No collaboration of care required for this visit.                                                   Patient/Guardian was advised Release of Information must be obtained prior to any record release in order to collaborate their care with an outside provider. Patient/Guardian was advised if they have not already done so to contact the registration department to sign all necessary forms in order for [name]  to release information regarding their care.    Consent: Patient/Guardian gives verbal consent for treatment and assignment of benefits for services provided during this visit. Patient/Guardian expressed understanding and agreed to proceed.  [name], [place], LCAS 10/01/22

## 2022-10-04 ENCOUNTER — Ambulatory Visit (INDEPENDENT_AMBULATORY_CARE_PROVIDER_SITE_OTHER): Payer: 59 | Admitting: Podiatry

## 2022-10-04 VITALS — BP 130/72

## 2022-10-04 DIAGNOSIS — M792 Neuralgia and neuritis, unspecified: Secondary | ICD-10-CM | POA: Diagnosis not present

## 2022-10-04 NOTE — Progress Notes (Signed)
Subjective:  Patient ID: Meghan [name], female    DOB: October 10, 1965,  MRN: [id number]  Chief Complaint  Patient presents with   Foot Pain    DOS: 05/28/2022 Procedure: Right partial metatarsophalangeal joint fusion  57 y.o. female returns for post-op check.  Patient presents for follow-up of neuritis of the left foot.  The medication has not helped much.  She states the right chest on a get aggravated.  She is scheduled for surgery in March for the left side.  Review of Systems: Negative except as noted in the HPI. Denies N/V/F/Ch.  Past Medical History:  Diagnosis Date   Anxiety    Arthritis    left knee   Asthma    Atrial fibrillation ([place]) 02/16/2019   CHF (congestive heart failure) ([place]) 01/2019   Physician from [place] informed her she had this    Diabetes mellitus without complication ([place])    Dysrhythmia    Atrial fibrillation   GERD (gastroesophageal reflux disease)    Hiatal hernia    History of pulmonary embolus ([place])    Hypercholesterolemia    Macular degeneration    of left eye   MDD (major depressive disorder)    Osteoarthritis    knees   Pneumonia    in the past   PTSD (post-traumatic stress disorder)    Pulmonary embolism ([place])    bilateral, none since 2003   Seizure disorder ([place])    Per patient last seizure was in 2020.   Sleep apnea CPAP   [facility]  Uses CPAP   Stroke ([place])    1989 and 1995 (left sided weakness)   Vitamin D deficiency     Current Outpatient Medications:    acetaminophen (TYLENOL) 500 MG tablet, Take 1,000 mg by mouth every 6 (six) hours as needed for moderate pain., Disp: , Rfl:    albuterol (VENTOLIN HFA) 108 (90 Base) MCG/ACT inhaler, Inhale 2 puffs into the lungs every 6 (six) hours as needed for wheezing or shortness of breath., Disp: , Rfl:    atorvastatin (LIPITOR) 40 MG tablet, Take 40 mg by mouth every evening., Disp: , Rfl:    BREZTRI AEROSPHERE 160-9-4.8 MCG/ACT AERO, Inhale 2 puffs into the lungs in the morning and at  bedtime. , Disp: , Rfl:    bumetanide (BUMEX) 1 MG tablet, Take 1 mg by mouth daily., Disp: , Rfl:    Cholecalciferol (VITAMIN D-3) 125 MCG (5000 UT) TABS, Take 5,000 Units by mouth daily., Disp: , Rfl:    diphenhydrAMINE HCl, Sleep, (ZZZQUIL) 50 MG/30ML LIQD, Take 30 mLs by mouth at bedtime as needed (sleep)., Disp: , Rfl:    flecainide (TAMBOCOR) 50 MG tablet, Take 50 mg by mouth 2 (two) times daily. , Disp: , Rfl:    gabapentin (NEURONTIN) 300 MG capsule, Take 1 capsule (300 mg total) by mouth 3 (three) times daily., Disp: 90 capsule, Rfl: 3   hydrOXYzine (ATARAX) 25 MG tablet, Take 1-2 tablets at bedtime for insomnia, Disp: 45 tablet, Rfl: 1   levETIRAcetam (KEPPRA) 500 MG tablet, Take 1 tablet (500 mg total) by mouth 2 (two) times daily., Disp: 180 tablet, Rfl: 3   Menthol-Camphor (TIGER BALM ARTHRITIS RUB) 11-11 % CREA, Apply 1 application  topically daily as needed (Knee pain)., Disp: , Rfl:    metFORMIN (GLUCOPHAGE) 500 MG tablet, Take 500 mg by mouth daily with breakfast., Disp: , Rfl:    metoprolol succinate (TOPROL-XL) 25 MG 24 hr tablet, Take 1 tablet (25 mg total) by mouth  daily. Take with or immediately following a meal., Disp: 90 tablet, Rfl: 3   neomycin-polymyxin b-dexamethasone (MAXITROL) 3.5-10000-0.1 SUSP, Place 1 drop into both eyes., Disp: , Rfl:    Omega-3 Fatty Acids (FISH OIL) 1200 MG CAPS, Take 1,200 mg by mouth daily with breakfast., Disp: , Rfl:    omeprazole (PRILOSEC) 40 MG capsule, Take 40 mg by mouth daily., Disp: , Rfl:    pregabalin (LYRICA) 100 MG capsule, Take 1 capsule (100 mg total) by mouth 2 (two) times daily., Disp: 60 capsule, Rfl: 0   rivaroxaban (XARELTO) 20 MG TABS tablet, Take 20 mg by mouth at bedtime. , Disp: , Rfl:    RYBELSUS 14 MG TABS, Take 14 mg by mouth daily., Disp: , Rfl:    sertraline (ZOLOFT) 50 MG tablet, Take 50 mg by mouth daily., Disp: , Rfl:   Social History   Tobacco Use  Smoking Status Never  Smokeless Tobacco Never     Allergies  Allergen Reactions   Acetaminophen-Codeine Swelling, Rash and Other (See Comments)    Tylenol with Codeine, Tylenol #3,  (facial swelling, hives)   Bee Venom Anaphylaxis   Meloxicam Anaphylaxis and Rash   Tramadol Hives and Other (See Comments)    Patient stated "I was trippin' and I do not want that ever again"   Codeine Swelling    hives   Coconut (Cocos Nucifera) Rash and Other (See Comments)    ANY coconut products    Tomato Rash    Fresh    Objective:   Vitals:   10/04/22 1310  BP: 130/72   There is no height or weight on file to calculate BMI. Constitutional Well developed. Well nourished.  Vascular Foot warm and well perfused. Capillary refill normal to all digits.   Neurologic Normal speech. Oriented to person, place, and time. Epicritic sensation to light touch grossly present bilaterally.  Numbness tingling noted to the left foot generalized.  No specific dermatome noted  Dermatologic Skin completely reepithelialized.  No signs of Deis is noted no complication noted.  No pain on palpation  Orthopedic: Pain on palpation left first metatarsophalangeal joint deep intra-articular pain noted pain and crepitus noted.  No pain at the sesamoidal complex.  Pain on palpation of first MPJ.   Radiographs: 3 views of skeletally mature the right foot: Hardware is intact no signs of backing or loosening noted.  Osseous bridging/consolidated noted at the first metatarsophalangeal joint good position alignment noted.  No signs of fractures or breakdown of hardware noted Assessment:   1. Neuritis      Plan:  Patient was evaluated and treated and all questions answered.  S/p foot surgery right -Clinically healed and doing well.  She is still having some discomfort in the surgical site.  However I discussed with the patient that is still consolidating and we will know for about 1 year after the surgery whether the fusion is completed.  I discussed this with the  patient she states understanding.  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  Given the amount of pain that she is experiencing the joint is in the setting of a bunion deformity I believe patient will benefit from best from MPJ fusion to the left side.  She states she is doing fine on the right side and would like to proceed with the same procedure on the left side.  I discussed my preoperative intraoperative postoperative plan in extensive detail she states understanding like to proceed with surgery. -Informed surgical risk consent was reviewed and read aloud to the patient.  I reviewed the films.  I have discussed my findings with the patient in great detail.  I have discussed all risks including but not limited to infection, stiffness, scarring, limp, disability, deformity, damage to blood vessels and nerves, numbness, poor healing, need for braces, arthritis, chronic pain, amputation, death.  All benefits and realistic expectations discussed in great detail.  I have made no promises as to the outcome.  I have provided realistic expectations.  I have offered the patient a 2nd opinion, which they have declined and assured me they preferred to proceed despite the risks Were just  Left neuritis -The patient the etiology of neuritis number is treatment options were discussed.  Lyrica did not help much.   No follow-ups on file.

## 2022-10-08 ENCOUNTER — Ambulatory Visit (INDEPENDENT_AMBULATORY_CARE_PROVIDER_SITE_OTHER): Payer: 59 | Admitting: Licensed Clinical Social Worker

## 2022-10-08 ENCOUNTER — Ambulatory Visit
Admission: RE | Admit: 2022-10-08 | Discharge: 2022-10-08 | Disposition: A | Discharge status: Home / Self Care | Payer: 59 | Source: Ambulatory Visit | Attending: Internal Medicine | Admitting: Internal Medicine

## 2022-10-08 ENCOUNTER — Ambulatory Visit (INDEPENDENT_AMBULATORY_CARE_PROVIDER_SITE_OTHER): Payer: 59

## 2022-10-08 VITALS — BP 109/73 | HR 65 | Temp 98.0°F | Resp 18 | Ht 65.0 in | Wt 307.0 lb

## 2022-10-08 DIAGNOSIS — M25512 Pain in left shoulder: Secondary | ICD-10-CM

## 2022-10-08 DIAGNOSIS — W19XXXA Unspecified fall, initial encounter: Secondary | ICD-10-CM

## 2022-10-08 DIAGNOSIS — M25532 Pain in left wrist: Secondary | ICD-10-CM

## 2022-10-08 DIAGNOSIS — M79642 Pain in left hand: Secondary | ICD-10-CM

## 2022-10-08 DIAGNOSIS — F4310 Post-traumatic stress disorder, unspecified: Secondary | ICD-10-CM

## 2022-10-08 DIAGNOSIS — M79622 Pain in left upper arm: Secondary | ICD-10-CM

## 2022-10-08 DIAGNOSIS — F331 Major depressive disorder, recurrent, moderate: Secondary | ICD-10-CM | POA: Diagnosis not present

## 2022-10-08 DIAGNOSIS — F411 Generalized anxiety disorder: Secondary | ICD-10-CM

## 2022-10-08 NOTE — Discharge Instructions (Signed)
X-rays were normal.  Apply ice and elevate extremities.  Wrist brace has been applied.  Do not sleep in this.  Follow-up with orthopedist if symptoms persist or worsen.

## 2022-10-08 NOTE — Progress Notes (Signed)
Virtual Visit via Video Note   I connected with Jahzara S. Feely on 10/08/22 at 2:00pm by video enabled telemedicine application and verified that I am speaking with the correct person using two identifiers.   I discussed the limitations, risks, security and privacy concerns of performing an evaluation and management service by video and the availability of in person appointments. I also discussed with the patient that there may be a patient responsible charge related to this service. The patient expressed understanding and agreed to proceed.   I discussed the assessment and treatment plan with the patient. The patient was provided an opportunity to ask questions and all were answered. The patient agreed with the plan and demonstrated an understanding of the instructions.   The patient was advised to call back or seek an in-person evaluation if the symptoms worsen or if the condition fails to improve as anticipated.   I provided 35 minutes of non-face-to-face time during this encounter.     [name], LCSW, LCAS _______________________ THERAPIST PROGRESS NOTE   Session Time:  2:00pm - 2:35pm         Location: Patient: Patient Home Provider: [facility] Office   Participation Level: Active    Behavioral Response: Alert, casually dressed, euthymic mood/affect     Type of Therapy:  Individual Therapy   Treatment Goals addressed: Mood management; Medication management; maintaining church attendance   Progress Towards Goals: Progressing    Interventions: CBT, problem solving, strengths based    Summary: Yurika S. Niese is a 57 year old divorced African American female that presented for therapy appointment today with diagnoses of Major Depressive Disorder, recurrent, moderate, Generalized Anxiety Disorder and PTSD.      Suicidal/Homicidal: None; without plan or intent                                                                                                                 Therapist Response:  Clinician met with Yameli for virtual therapy session and assessed for safety, sobriety, and medication compliance.  Tyrhonda presented for appointment on time and was alert, oriented x5, with no evidence or self-report of active SI/HI or A/V H.  Maycel reported ongoing compliance with medication and denied any use of alcohol or illicit substances.  Clinician inquired about Bobi's emotional ratings today, as well as any significant changes in thoughts, feelings, or behavior since last check-in.  Tyarra reported scores of 0/10 for depression, 0/10 for anxiety, and 0/10 for anger/irritability.  Arleta denied any recent outbursts or panic attacks.  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  Clinician inquired about how this experience was for Talaya, including overall sense of support from congregation, whether the message from faith leader(s) resonated with her, and any issues that arose worth noting.  Tiffanny reported that she enjoyed the sermon from the pastor, and felt that his age was closer to her own.  She reported that members of  the church were quick to greet her, and there did not appear to be any gossiping that she observed.  She reported that it was also close to her apartment, and they offered to start picking her up for services with a van.  Clinician assisted Deaisha in running a cost benefit analysis regarding whether or not to look into alternative churches in the area, or continue attending this one based upon positive initial response.  Ellery participated in analysis and reported that she detected no downsides to attending this new church, and plans to return again for bible study, choir, and dance activities this week once transportation is arranged.  Sharen stated "It just felt so good to be in a place like that".  Clinician was supportive of Chrisoula making positive changes to her network  based upon problems reported at previous church, and encouraged her to be mindful of red flags that might indicate she is overwhelming herself with extracurricular activities (i.e. noticing urge to isolate, ignore outreach from supports to check on her) at new institution.  Laiba was agreeable to this, stating "If I start to feel like I have before, Ill just slow down".  Solange reported that when she got off the bus from church the other day, she fell on her side, and noticed increased swelling and pain the following day, so she made an appointment with her doctor for this afternoon.  As a result, Dreanna reported that she needed to end session early once notified that medical transport had arrived.  She agreed to follow up next week for therapy.  Clinician will continue to monitor.            Plan: Follow up again in 1 week.    Diagnosis: Major depressive disorder, recurrent, moderate; Generalized Anxiety Disorder; and PTSD   Collaboration of Care:   No collaboration of care required for this visit.                                                   Patient/Guardian was advised Release of Information must be obtained prior to any record release in order to collaborate their care with an outside provider. Patient/Guardian was advised if they have not already done so to contact the registration department to sign all necessary forms in order for [name] to release information regarding their care.    Consent: Patient/Guardian gives verbal consent for treatment and assignment of benefits for services provided during this visit. Patient/Guardian expressed understanding and agreed to proceed.  [name], [place], LCAS 10/08/22

## 2022-10-08 NOTE — ED Triage Notes (Addendum)
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  She was fine yesterday, this morning she began having a lot of pain.  Patient did apply ice, heat and Tylenol PM.

## 2022-10-08 NOTE — ED Provider Notes (Addendum)
[facility]    CSN: [id number] Arrival date & time: 10/08/22  1518      History   Chief Complaint Chief Complaint  Patient presents with   Fall    Entered by patient    HPI Meghan [name] is a 57 y.o. female.   Patient presents with left arm pain after a fall that occurred yesterday.  Patient reports that she misstepped getting off the church van and fell landing directly on her left arm/shoulder.  She reports majority of pain is in the left shoulder/left upper arm and left wrist.  She denies hitting head or losing consciousness.  She does take Xarelto.  She has taken Tylenol for pain with minimal improvement.  Denies any numbness or tingling.   Fall    Past Medical History:  Diagnosis Date   Anxiety    Arthritis    left knee   Asthma    Atrial fibrillation ([place]) 02/16/2019   CHF (congestive heart failure) ([place]) 01/2019   Physician from [place] informed her she had this    Diabetes mellitus without complication ([place])    Dysrhythmia    Atrial fibrillation   GERD (gastroesophageal reflux disease)    Hiatal hernia    History of pulmonary embolus (PE)    Hypercholesterolemia    Intractable nausea and vomiting 11/16/2020   Macular degeneration    of left eye   MDD (major depressive disorder)    Osteoarthritis    knees   Pneumonia    in the past   PTSD (post-traumatic stress disorder)    Pulmonary embolism ([place])    bilateral, none since 2003   Seizure disorder ([place])    Per patient last seizure was in 2020.   Sleep apnea CPAP   [facility]  Uses CPAP   Stroke ([place])    1989 and 1995 (left sided weakness)   Vitamin D deficiency     Patient Active Problem List   Diagnosis Date Noted   MDD (major depressive disorder), recurrent episode, moderate ([place]) 07/30/2022   GAD (generalized anxiety disorder) 07/30/2022   PTSD (post-traumatic stress disorder) 07/30/2022   Insomnia 05/24/2022   Arthritis of first metatarsophalangeal (MTP) joint of right foot  04/25/2022   Intractable nausea and vomiting 11/16/2020   Ileus ([place]) 11/14/2020   Fecal impaction ([place]) 11/14/2020   Left-sided weakness 09/29/2020   Asthma 09/29/2020   Pre-diabetes 09/29/2020   Pain in left finger(s) 07/12/2020   Seizure-like activity ([place]) 03/25/2020   History of head injury 03/25/2020   Obstructive sleep apnea syndrome 09/04/2019   Acute medial meniscus tear of right knee 07/10/2019   Class 3 severe obesity due to excess calories with serious comorbidity and body mass index (BMI) of 50.0 to 59.9 in adult ([place]) 06/01/2019   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   Atrial fibrillation ([place]) 04/17/2019   History of pulmonary embolus (PE) 04/17/2019    Past Surgical History:  Procedure Laterality Date   ABDOMINAL HYSTERECTOMY  2003   complete   CARDIAC CATHETERIZATION  2017   in [facility] in [place] N/A 08/22/2022   Procedure: CARDIOVERSION;  Surgeon: Berniece Salines, DO;  Location: [facility];  Service: Cardiovascular;  Laterality: N/A;   CESAREAN SECTION  1245,8099   x 2   CHOLECYSTECTOMY  2003   COLONOSCOPY WITH PROPOFOL N/A 06/05/2019   Procedure: COLONOSCOPY WITH PROPOFOL;  Surgeon: Carol Ada, MD;  Location: [facility];  Service: Endoscopy;  Laterality: N/A;   CYST REMOVAL WITH BONE  GRAFT Left 10/18/2020   Procedure: BONE GRAFTING OF ENCHONDROMA MIDDLE [place] OF LEFT MIDDLE FINGER;  Surgeon: Daryll Brod, MD;  Location: [facility];  Service: Orthopedics;  Laterality: Left;  AXILLARY BLOCK   DIAGNOSTIC LAPAROSCOPY  2015; 2017   lap hernia repair x2   endoscopy     ENDOVENOUS ABLATION SAPHENOUS VEIN W/ LASER Left 08/31/2021   endovenous laser ablation left greater saphenous vein and stab phlebectomy 10-20 incisions left leg by Servando Snare MD   IXL Right 05/28/2022   Procedure: HALLUX FUSION METATARSAL PHALANGEAL JOIT;  Surgeon: [name], DPM;  Location: [facility];  Service: Podiatry;  Laterality:  Right;  BLOCK   HERNIA REPAIR  9326, 7124   umbilical hernia repair   IVC FILTER INSERTION  2003   Hx pulmonary embolus   POLYPECTOMY  06/05/2019   Procedure: POLYPECTOMY;  Surgeon: Carol Ada, MD;  Location: [facility];  Service: Endoscopy;;   SHOULDER ARTHROSCOPY W/ ROTATOR CUFF REPAIR  08/28/2017   right shoulder   WRIST ARTHROSCOPY WITH DEBRIDEMENT Left 10/18/2020   Procedure: LEFT WRIST ARTHROSCOPY WITH DEBRIDEMENT;  Surgeon: Daryll Brod, MD;  Location: [facility];  Service: Orthopedics;  Laterality: Left;  AXILLARY BLOCK   WRIST ARTHROSCOPY WITH DEBRIDEMENT Right 01/25/2022   Procedure: ARTHROSCOPY RIGHT WRIST WITH  DEBRIDEMENT/ SHRINKAGE;  Surgeon: Leanora Cover, MD;  Location: [facility];  Service: Orthopedics;  Laterality: Right;    OB History   No obstetric history on file.      Home Medications    Prior to Admission medications   Medication Sig Start Date End Date Taking? Authorizing Provider  acetaminophen (TYLENOL) 500 MG tablet Take 1,000 mg by mouth every 6 (six) hours as needed for moderate pain.   Yes [provider]  albuterol (VENTOLIN HFA) 108 (90 Base) MCG/ACT inhaler Inhale 2 puffs into the lungs every 6 (six) hours as needed for wheezing or shortness of breath.   Yes [provider]  atorvastatin (LIPITOR) 40 MG tablet Take 40 mg by mouth every evening. 07/26/21  Yes [provider]  BREZTRI AEROSPHERE 160-9-4.8 MCG/ACT AERO Inhale 2 puffs into the lungs in the morning and at bedtime.  03/24/20  Yes [provider]  bumetanide (BUMEX) 1 MG tablet Take 1 mg by mouth daily. 10/24/21  Yes [provider]  Cholecalciferol (VITAMIN D-3) 125 MCG (5000 UT) TABS Take 5,000 Units by mouth daily.   Yes [provider]  diphenhydrAMINE HCl, Sleep, (ZZZQUIL) 50 MG/30ML LIQD Take 30 mLs by mouth at bedtime as needed (sleep).   Yes [provider]  flecainide (TAMBOCOR) 50 MG tablet Take 50 mg by mouth 2 (two) times daily.   09/04/19  Yes [provider]  levETIRAcetam (KEPPRA) 500 MG tablet Take 1 tablet (500 mg total) by mouth 2 (two) times daily. 07/04/21  Yes Marcial Pacas, MD  Menthol-Camphor (TIGER BALM ARTHRITIS RUB) 11-11 % CREA Apply 1 application  topically daily as needed (Knee pain).   Yes [provider]  metFORMIN (GLUCOPHAGE) 500 MG tablet Take 500 mg by mouth daily with breakfast.   Yes [provider]  metoprolol succinate (TOPROL-XL) 25 MG 24 hr tablet Take 1 tablet (25 mg total) by mouth daily. Take with or immediately following a meal. 07/19/22  Yes Branch, Royetta Crochet, MD  Omega-3 Fatty Acids (FISH OIL) 1200 MG CAPS Take 1,200 mg by mouth daily with breakfast.   Yes [provider]  omeprazole (PRILOSEC) 40 MG capsule Take  40 mg by mouth daily. 08/17/20  Yes [provider]  pregabalin (LYRICA) 100 MG capsule Take 1 capsule (100 mg total) by mouth 2 (two) times daily. 09/07/22  Yes [name], DPM  rivaroxaban (XARELTO) 20 MG TABS tablet Take 20 mg by mouth at bedtime.    Yes [provider]  RYBELSUS 14 MG TABS Take 14 mg by mouth daily. 07/27/21  Yes [provider]  sertraline (ZOLOFT) 50 MG tablet Take 50 mg by mouth daily. 01/01/21  Yes [provider]  gabapentin (NEURONTIN) 300 MG capsule Take 1 capsule (300 mg total) by mouth 3 (three) times daily. 08/01/22   [name], DPM  hydrOXYzine (ATARAX) 25 MG tablet Take 1-2 tablets at bedtime for insomnia 07/02/22   Martyn Ehrich, NP  neomycin-polymyxin b-dexamethasone (MAXITROL) 3.5-10000-0.1 SUSP Place 1 drop into both eyes. 07/25/22   [provider]    Family History Family History  Problem Relation Age of Onset   Breast cancer Mother    Heart attack Father    Diabetes Father    Congestive Heart Failure Father    Breast cancer Maternal Grandmother     Social History Social History   Tobacco Use   Smoking status: Never   Smokeless tobacco: Never   Vaping Use   Vaping Use: Never used  Substance Use Topics   Alcohol use: Never   Drug use: Never     Allergies   Acetaminophen-codeine, Bee venom, Meloxicam, Tramadol, Codeine, Coconut (cocos nucifera), and Tomato   Review of Systems Review of Systems Per HPI  Physical Exam Triage Vital Signs ED Triage Vitals  Enc Vitals Group     BP 10/08/22 1628 109/73     Pulse Rate 10/08/22 1628 65     Resp 10/08/22 1628 18     Temp 10/08/22 1628 98 F (36.7 C)     Temp Source 10/08/22 1628 Oral     SpO2 10/08/22 1628 98 %     Weight 10/08/22 1629 (!) 307 lb (139.3 kg)     Height 10/08/22 1629 '5\' 5"'$  (1.651 m)     Head Circumference --      Peak Flow --      Pain Score 10/08/22 1629 10     Pain Loc --      Pain Edu? --      Excl. in [place]? --    No data found.  Updated Vital Signs BP 109/73 (BP Location: Right Arm)   Pulse 65   Temp 98 F (36.7 C) (Oral)   Resp 18   Ht '5\' 5"'$  (1.651 m)   Wt (!) 307 lb (139.3 kg)   SpO2 98%   BMI 51.09 kg/m   Visual Acuity Right Eye Distance:   Left Eye Distance:   Bilateral Distance:    Right Eye Near:   Left Eye Near:    Bilateral Near:     Physical Exam Constitutional:      General: She is not in acute distress.    Appearance: Normal appearance. She is not toxic-appearing or diaphoretic.  HENT:     Head: Normocephalic and atraumatic.  Eyes:     Extraocular Movements: Extraocular movements intact.     Conjunctiva/sclera: Conjunctivae normal.  Pulmonary:     Effort: Pulmonary effort is normal.  Musculoskeletal:     Comments: Tenderness to palpation to anterior left shoulder that extends slightly into upper left humerus.  Patient also has tenderness to lower left humerus.  No tenderness to elbow or forearm.  Patient has tenderness to palpation throughout left wrist and throughout left hand/fingers.  There are no abrasions, lacerations, discoloration, swelling noted.  Grip strength is 5/5.  She is neurovascularly intact.  Limited  range of motion of left shoulder due to pain.  Pain with abduction of 45 degrees.  Patient is able to fully extend elbow and supinate and pronate.  Neurological:     General: No focal deficit present.     Mental Status: She is alert and oriented to person, place, and time. Mental status is at baseline.  Psychiatric:        Mood and Affect: Mood normal.        Behavior: Behavior normal.        Thought Content: Thought content normal.        Judgment: Judgment normal.      UC Treatments / Results  Labs (all labs ordered are listed, but only abnormal results are displayed) Labs Reviewed - No data to display  EKG   Radiology DG Hand Complete Left  Result Date: 10/08/2022 CLINICAL DATA:  [place] yesterday stepping off in church van, fell onto arm and shoulder, pain today EXAM: LEFT HAND - COMPLETE 3+ VIEW COMPARISON:  None Available. FINDINGS: Osseous mineralization normal. Joint spaces preserved. No fracture, dislocation, or bone destruction. IMPRESSION: Normal exam. Electronically Signed   By: Lavonia Dana M.D.   On: 10/08/2022 17:41   DG Wrist Complete Left  Result Date: 10/08/2022 CLINICAL DATA:  [place] yesterday stepping off in church van, fell onto arm and shoulder, pain today EXAM: LEFT WRIST - COMPLETE 3+ VIEW COMPARISON:  None Available. FINDINGS: Osseous mineralization normal. Joint spaces preserved. No acute fracture, dislocation, or bone destruction. IMPRESSION: No acute osseous abnormalities Electronically Signed   By: Lavonia Dana M.D.   On: 10/08/2022 17:32   DG Humerus Left  Result Date: 10/08/2022 CLINICAL DATA:  [place] yesterday stepping off in church van, fell onto arm and shoulder, pain today EXAM: LEFT HUMERUS - 2+ VIEW COMPARISON:  None Available. FINDINGS: Osseous mineralization normal. Joint spaces preserved. No fracture, dislocation, or bone destruction. IMPRESSION: No abnormalities. Electronically Signed   By: Lavonia Dana M.D.   On: 10/08/2022 17:31   DG Shoulder  Left  Result Date: 10/08/2022 CLINICAL DATA:  [place] yesterday stepping off in church van, fell onto arm and shoulder, pain today EXAM: LEFT SHOULDER - 2+ VIEW COMPARISON:  None Available. FINDINGS: Osseous mineralization normal. AC joint alignment normal. No acute fracture, dislocation, or bone destruction. Visualized ribs intact. IMPRESSION: No acute osseous abnormalities. Electronically Signed   By: Lavonia Dana M.D.   On: 10/08/2022 17:30    Procedures Procedures (including critical care time)  Medications Ordered in UC Medications - No data to display  Initial Impression / Assessment and Plan / UC Course  I have reviewed the triage vital signs and the nursing notes.  Pertinent labs & imaging results that were available during my care of the patient were reviewed by me and considered in my medical decision making (see chart for details).     All x-rays were negative for any acute bony abnormality.  Given mechanism of injury, suspect contusion versus sprain/strain.  Patient takes Xarelto but given no head injury or signs of neurovascular compromise/ bleeding on exam so do not think that emergent evaluation is necessary.  Wrist brace applied in urgent care for wrist pain.  Advised ice application, elevation of extremity, supportive care.  Patient was  advised to follow-up with her already established orthopedist if symptoms persist or worsen.  Patient verbalized understanding and was agreeable with plan. Final Clinical Impressions(s) / UC Diagnoses   Final diagnoses:  Fall, initial encounter  Left wrist pain  Left hand pain  Acute pain of left shoulder     Discharge Instructions      X-rays were normal.  Apply ice and elevate extremities.  Wrist brace has been applied.  Do not sleep in this.  Follow-up with orthopedist if symptoms persist or worsen.     ED Prescriptions   None    PDMP not reviewed this encounter.   Teodora Medici, [place] 10/08/22 [place], [place],  [place] 10/08/22 [phone]

## 2022-10-15 ENCOUNTER — Ambulatory Visit (INDEPENDENT_AMBULATORY_CARE_PROVIDER_SITE_OTHER): Payer: 59 | Admitting: Licensed Clinical Social Worker

## 2022-10-15 DIAGNOSIS — F411 Generalized anxiety disorder: Secondary | ICD-10-CM

## 2022-10-15 DIAGNOSIS — F4310 Post-traumatic stress disorder, unspecified: Secondary | ICD-10-CM

## 2022-10-15 DIAGNOSIS — F331 Major depressive disorder, recurrent, moderate: Secondary | ICD-10-CM | POA: Diagnosis not present

## 2022-10-15 NOTE — Progress Notes (Signed)
Virtual Visit via Video Note   I connected with Meghan [name] on 10/15/22 at 2:00pm by video enabled telemedicine application and verified that I am speaking with the correct person using two identifiers.   I discussed the limitations, risks, security and privacy concerns of performing an evaluation and management service by video and the availability of in person appointments. I also discussed with the patient that there may be a patient responsible charge related to this service. The patient expressed understanding and agreed to proceed.   I discussed the assessment and treatment plan with the patient. The patient was provided an opportunity to ask questions and all were answered. The patient agreed with the plan and demonstrated an understanding of the instructions.   The patient was advised to call back or seek an in-person evaluation if the symptoms worsen or if the condition fails to improve as anticipated.   I provided 1 hour of non-face-to-face time during this encounter.     [name], LCSW, LCAS _______________________ THERAPIST PROGRESS NOTE   Session Time:  2:00pm - 3:00pm   Location: Patient: Patient Home Provider: Clinical Home Office   Participation Level: Active    Behavioral Response: Alert, casually dressed, euthymic mood/affect     Type of Therapy:  Individual Therapy   Treatment Goals addressed: Mood management; Medication management; maintaining church attendance   Progress Towards Goals: Progressing    Interventions: CBT: challenging anxious thoughts.     Summary: Meghan [name] is a 57 year old divorced African American female that presented for therapy appointment today with diagnoses of Major Depressive Disorder, recurrent, moderate, Generalized Anxiety Disorder and PTSD.      Suicidal/Homicidal: None; without plan or intent                                                                                                                Therapist Response:   Clinician met with Meghan [name] for virtual therapy appointment and assessed for safety, sobriety, and medication compliance.  Meghan [name] presented for session on time and was alert, oriented x5, with no evidence or self-report of active SI/HI or A/V H.  Meghan [name] reported ongoing compliance with medication and denied any use of alcohol or illicit substances.  Clinician inquired about Meghan [name]'s current emotional ratings, as well as any significant changes in thoughts, feelings, or behavior since previous check-in.  Meghan [name] reported scores of 0/10 for depression, 0/10 for anxiety, and 0/10 for anger/irritability.  Meghan [name] denied any recent outbursts or panic attacks.  Meghan [name] reported that a recent success was having a busy, but enjoyable weekend.  She reported that she went to her new church again and had a good service, in addition to spending time with her sister getting food and having her hair done.  Meghan [name] reported that although she also watched dance practice on Saturday morning, she didn't participate yet.  Meghan [name] reported that she is scheduled to participate tonight in her first practice with this team, and was a little worried, stating "All of them went through dance  school and are real close".  Clinician utilized handout in session today titled "Worry exploration" in order to assist Shaquan in reducing her anxiety related to upcoming dance practice.  This worksheet featured a series of Socratic questions aimed at exploring the most likely outcomes for a situation of concern, rather than focusing on the worst possible outcome (i.e. catastrophizing).  Clinician assisted Orie in identifying and challenging any irrational beliefs related to this worry, in addition to utilizing problem solving approach to explore strategies which would help her accomplish goal of participating in this recital with minimal anxiety.  Hanako actively participated in discussion on handout, reporting that she is not only worried about how she will perform due  to lack of energy, but how others on the team will treat her.  Roylene reported that there is sufficient evidence to suggest she will perform and be accepted by the team, as they were very accepting when she met them the other day, and the leader was very supportive as well, and attentive to her needs.  She reported that she also handles anxiety better than she used to, and could practice her coping skills learned from therapy to address any related stress in healthy manner.  Intervention was effective, as evidenced by Dorice reporting that discussion on this subject reduced her anxiety and boosted her confidence in her ability to attend practice without issue.  Chirstina stated "It feels good.  I'm extremely happy".  Clinician will continue to monitor.            Plan: Follow up again in 1 week.    Diagnosis: Major depressive disorder, recurrent, moderate; Generalized Anxiety Disorder; and PTSD   Collaboration of Care:   No collaboration of care required for this visit.                                                   Patient/Guardian was advised Release of Information must be obtained prior to any record release in order to collaborate their care with an outside provider. Patient/Guardian was advised if they have not already done so to contact the registration department to sign all necessary forms in order for [name] to release information regarding their care.    Consent: Patient/Guardian gives verbal consent for treatment and assignment of benefits for services provided during this visit. Patient/Guardian expressed understanding and agreed to proceed.  [name], [place], LCAS 10/15/22

## 2022-10-22 ENCOUNTER — Ambulatory Visit: Payer: 59 | Admitting: Internal Medicine

## 2022-10-22 ENCOUNTER — Encounter (HOSPITAL_COMMUNITY): Payer: Self-pay

## 2022-10-22 ENCOUNTER — Ambulatory Visit (HOSPITAL_COMMUNITY): Payer: 59 | Admitting: Licensed Clinical Social Worker

## 2022-10-24 ENCOUNTER — Encounter: Payer: Self-pay | Admitting: Internal Medicine

## 2022-10-24 ENCOUNTER — Ambulatory Visit: Payer: 59 | Attending: Internal Medicine | Admitting: Internal Medicine

## 2022-10-24 VITALS — BP 126/84 | HR 71 | Ht 65.0 in | Wt 313.0 lb

## 2022-10-24 DIAGNOSIS — I483 Typical atrial flutter: Secondary | ICD-10-CM

## 2022-10-24 DIAGNOSIS — Z6841 Body Mass Index (BMI) 40.0 and over, adult: Secondary | ICD-10-CM

## 2022-10-24 NOTE — Patient Instructions (Signed)
Medication Instructions:  No Changes In Medications at this time.  *If you need a refill on your cardiac medications before your next appointment, please call your pharmacy*  Lab Work: None Ordered At This Time.  If you have labs (blood work) drawn today and your tests are completely normal, you will receive your results only by: [place] (if you have MyChart) OR A paper copy in the mail If you have any lab test that is abnormal or we need to change your treatment, we will call you to review the results.  Testing/Procedures: None Ordered At This Time.   Follow-Up: At [facility], you and your health needs are our priority.  As part of our continuing mission to provide you with exceptional heart care, we have created designated Provider Care Teams.  These Care Teams include your primary Cardiologist (physician) and Advanced Practice Providers (APPs -  Physician Assistants and Nurse Practitioners) who all work together to provide you with the care you need, when you need it.  Your next appointment:   6 month(s)  Provider:   Janina Mayo, MD    REFERRAL TO [facility]- SOMEONE WILL CONTACT YOU TO SCHEDULE

## 2022-10-24 NOTE — Progress Notes (Signed)
Cardiology Office Note:    Date:  10/24/2022   ID:  Meghan [name], DOB 1966/06/29, MRN [id number]  PCP:  Beverley Fiedler, [place] Providers Cardiologist:  Janina Mayo, MD Electrophysiologist:  Melida Quitter, MD     Referring MD: Beverley Fiedler, *   No chief complaint on file. Atrial Flutter  History of Present Illness:    Meghan [name] is a 57 y.o. female with a hx of T2DM, OSA on CPAP, hx of PE in 1993 (was on warfarin prior, no xarelto) s/p IVC filter (has had multiple PEs despite AC therefore had filter placed per patient), ? HFpEF (EF unknown), stroke, HLD, atrial fibrillation on xarelto, ECG resolution is not optimal. Was followed by Dr. Shirlee More at [facility] in 2020. She was managed on flecainide and ? cardizem.  Had an echo 2020, can't see the results. She was referred from her PCP at [facility] for afib.  Found to be 2:1 atrial flutter.  She has hx of heart failure per patient in [place]. , she was on lasix. She had 1 hospitalization for this at [facility]. She had IV lasix. No hx of CAD. She notes palpitations. She feels more fatigued. She sleeps on 3 pillows. No PND. No LE edema. Good blood pressure. She sees Dr. Edson Snowball at [facility].  Interim hx 10/24/2022: S/p DCCV and converted to NSR. She is preparing for flutter ablation. She is taking her xarelto. She is having L shoulder MSK pain. Gained some weight up to 307 after being in the 200s.   Cardiology Studies: TTE 08/14/2022 - normal EF, RV is normal, no significant valve dx, IVC is normal  Past Medical History:  Diagnosis Date   Anxiety    Arthritis    left knee   Asthma    Atrial fibrillation ([place]) 02/16/2019   CHF (congestive heart failure) ([place]) 01/2019   Physician from [place] informed her she had this    Diabetes mellitus without complication ([place])    Dysrhythmia    Atrial fibrillation   GERD (gastroesophageal reflux disease)    Hiatal hernia    History of pulmonary embolus (PE)     Hypercholesterolemia    Intractable nausea and vomiting 11/16/2020   Macular degeneration    of left eye   MDD (major depressive disorder)    Osteoarthritis    knees   Pneumonia    in the past   PTSD (post-traumatic stress disorder)    Pulmonary embolism ([place])    bilateral, none since 2003   Seizure disorder ([place])    Per patient last seizure was in 2020.   Sleep apnea CPAP   [facility]  Uses CPAP   Stroke ([place])    1989 and 1995 (left sided weakness)   Vitamin D deficiency     Past Surgical History:  Procedure Laterality Date   ABDOMINAL HYSTERECTOMY  2003   complete   CARDIAC CATHETERIZATION  2017   in [facility] in [place] N/A 08/22/2022   Procedure: CARDIOVERSION;  Surgeon: Berniece Salines, DO;  Location: [place];  Service: Cardiovascular;  Laterality: N/A;   CESAREAN SECTION  [id number]   x 2   CHOLECYSTECTOMY  2003   COLONOSCOPY WITH PROPOFOL N/A 06/05/2019   Procedure: COLONOSCOPY WITH PROPOFOL;  Surgeon: Carol Ada, MD;  [facility];  Service: Endoscopy;  Laterality: N/A;   CYST REMOVAL WITH BONE GRAFT Left 10/18/2020   Procedure: BONE GRAFTING OF ENCHONDROMA MIDDLE  [facility] OF LEFT MIDDLE FINGER;  Surgeon: Daryll Brod, MD;  Location: [place];  Service: Orthopedics;  Laterality: Left;  AXILLARY BLOCK   DIAGNOSTIC LAPAROSCOPY  2015; 2017   lap hernia repair x2   endoscopy     ENDOVENOUS ABLATION SAPHENOUS VEIN W/ LASER Left 08/31/2021   endovenous laser ablation left greater saphenous vein and stab phlebectomy 10-20 incisions left leg by Servando Snare MD   [place] Right 05/28/2022   Procedure: HALLUX FUSION METATARSAL PHALANGEAL JOIT;  Surgeon: [name], DPM;  Location: [place];  Service: Podiatry;  Laterality: Right;  BLOCK   HERNIA REPAIR  [id number], [id number]   umbilical hernia repair   IVC FILTER INSERTION  2003   Hx pulmonary embolus   POLYPECTOMY  06/05/2019   Procedure: POLYPECTOMY;  Surgeon: Carol Ada, MD;  [facility];  Service: Endoscopy;;   SHOULDER ARTHROSCOPY W/ ROTATOR CUFF REPAIR  08/28/2017   right shoulder   WRIST ARTHROSCOPY WITH DEBRIDEMENT Left 10/18/2020   Procedure: LEFT WRIST ARTHROSCOPY WITH DEBRIDEMENT;  Surgeon: Daryll Brod, MD;  Location: [place];  Service: Orthopedics;  Laterality: Left;  AXILLARY BLOCK   WRIST ARTHROSCOPY WITH DEBRIDEMENT Right 01/25/2022   Procedure: ARTHROSCOPY RIGHT WRIST WITH  DEBRIDEMENT/ SHRINKAGE;  Surgeon: Leanora Cover, MD;  Location: [place];  Service: Orthopedics;  Laterality: Right;    Current Medications: No outpatient medications have been marked as taking for the 10/24/22 encounter (Appointment) with Janina Mayo, MD.     Allergies:   Acetaminophen-codeine, Bee venom, Meloxicam, Tramadol, Codeine, Coconut (cocos nucifera), and Tomato   Social History   Socioeconomic History   Marital status: Divorced    Spouse name: Not on file   Number of children: 3   Years of education: Not on file   Highest education level: Not on file  Occupational History   Not on file  Tobacco Use   Smoking status: Never   Smokeless tobacco: Never  Vaping Use   Vaping Use: Never used  Substance and Sexual Activity   Alcohol use: Never   Drug use: Never   Sexual activity: Not Currently    Birth control/protection: Surgical    Comment: Hysterectomy  Other Topics Concern   Not on file  Social History Narrative   ** Merged History Encounter **       Social Determinants of Health   Financial Resource Strain: Not on file  Food Insecurity: Not on file  Transportation Needs: Not on file  Physical Activity: Not on file  Stress: Not on file  Social Connections: Not on file     Family History: The patient's family history includes Breast cancer in her maternal grandmother and mother; Congestive Heart Failure in her father; Diabetes in her father; Heart attack in her father.  ROS:   Please see the history of present illness.      All other systems reviewed and are negative.  EKGs/Labs/Other Studies Reviewed:    The following studies were reviewed today:   EKG:  EKG is  ordered today.  The ekg ordered today demonstrates   07/19/2022- atrial flutter with variable AV block  Recent Labs: 11/27/2021: ALT 14 07/27/2022: BUN 18; Creatinine, Ser 1.03; Hemoglobin 12.3; Platelets 291; Potassium 4.6; Sodium 144  Recent Lipid Panel No results found for: "CHOL", "TRIG", "HDL", "CHOLHDL", "VLDL", "LDLCALC", "LDLDIRECT"   Risk Assessment/Calculations:    CHA2DS2-VASc Score = 5   This indicates a 7.2% annual risk of stroke. The patient's score is based  upon: CHF History: 1 HTN History: 0 Diabetes History: 1 Stroke History: 2 Vascular Disease History: 0 Age Score: 0 Gender Score: 1       Physical Exam:    VS:  There were no vitals taken for this visit.    Wt Readings from Last 3 Encounters:  10/08/22 (!) 307 lb (139.3 kg)  08/22/22 (!) 304 lb (137.9 kg)  08/20/22 (!) 306 lb (138.8 kg)     GEN:  Well nourished, well developed in no acute distress HEENT: Normal NECK: No JVD; No carotid bruits LYMPHATICS: No lymphadenopathy CARDIAC: RRR, no murmurs, rubs, gallops RESPIRATORY:  Clear to auscultation without rales, wheezing or rhonchi  ABDOMEN: Soft, non-tender, non-distended MUSCULOSKELETAL:  No edema; No deformity  SKIN: Warm and dry NEUROLOGIC:  Alert and oriented x 3 PSYCHIATRIC:  Normal affect   ASSESSMENT:    Typical Atrial Flutter/Paroxysmal Atrial Fibrillation: Diagnosed after a stroke several years ago. Has flutter with variable block. She is mildly symptomatic with fatigue.  She's been on flecainide at least since 2021.  - in sinus rhythm s/p DCCV 08/22/2022 - saw EP 12/4, recommended ablation, planned for this 11/02/2022  -continue xarelto 20 mg daily  -continue flecainide 50 mg BID  -cont metoprolol 25 mg XL  HFpEF?: Unknown EF. Euvolemic today. Dry weight ~139 kg (gained adipose weight) -  TTE today - continue bumex 1 mg daily - discuss SGLT2 if she decompensates  Dm2: A1c goal <7. ON GLP1.  HLD: lipitor 40 mg daily PLAN:    In order of problems listed above:  No changes Referral to healthy weight and wellness Follow up 6 months     Medication Adjustments/Labs and Tests Ordered: Current medicines are reviewed at length with the patient today.  Concerns regarding medicines are outlined above.  No orders of the defined types were placed in this encounter.  No orders of the defined types were placed in this encounter.   There are no Patient Instructions on file for this visit.   Signed, Janina Mayo, MD  10/24/2022 10:36 AM    [place]

## 2022-10-26 ENCOUNTER — Ambulatory Visit: Payer: 59 | Attending: Internal Medicine

## 2022-10-26 DIAGNOSIS — I480 Paroxysmal atrial fibrillation: Secondary | ICD-10-CM

## 2022-10-26 DIAGNOSIS — I483 Typical atrial flutter: Secondary | ICD-10-CM

## 2022-10-27 LAB — CBC WITH DIFFERENTIAL/PLATELET
Basophils Absolute: 0 10*3/uL (ref 0.0–0.2)
Basos: 0 %
EOS (ABSOLUTE): 0.1 10*3/uL (ref 0.0–0.4)
Eos: 2 %
Hematocrit: 34.9 % (ref 34.0–46.6)
Hemoglobin: 11.2 g/dL (ref 11.1–15.9)
Immature Grans (Abs): 0 10*3/uL (ref 0.0–0.1)
Immature Granulocytes: 0 %
Lymphocytes Absolute: 1.8 10*3/uL (ref 0.7–3.1)
Lymphs: 31 %
MCH: 26.9 pg (ref 26.6–33.0)
MCHC: 32.1 g/dL (ref 31.5–35.7)
MCV: 84 fL (ref 79–97)
Monocytes Absolute: 0.5 10*3/uL (ref 0.1–0.9)
Monocytes: 8 %
Neutrophils Absolute: 3.5 10*3/uL (ref 1.4–7.0)
Neutrophils: 59 %
Platelets: 267 10*3/uL (ref 150–450)
RBC: 4.16 x10E6/uL (ref 3.77–5.28)
RDW: 14.2 % (ref 11.7–15.4)
WBC: 6 10*3/uL (ref 3.4–10.8)

## 2022-10-27 LAB — BASIC METABOLIC PANEL
BUN/Creatinine Ratio: 18 (ref 9–23)
BUN: 15 mg/dL (ref 6–24)
CO2: 27 mmol/L (ref 20–29)
Calcium: 10.4 mg/dL — ABNORMAL HIGH (ref 8.7–10.2)
Chloride: 105 mmol/L (ref 96–106)
Creatinine, Ser: 0.84 mg/dL (ref 0.57–1.00)
Glucose: 106 mg/dL — ABNORMAL HIGH (ref 70–99)
Potassium: 3.8 mmol/L (ref 3.5–5.2)
Sodium: 144 mmol/L (ref 134–144)
eGFR: 80 mL/min/{1.73_m2} (ref >59)

## 2022-10-29 ENCOUNTER — Telehealth: Payer: Self-pay | Admitting: Urology

## 2022-10-29 ENCOUNTER — Ambulatory Visit (HOSPITAL_COMMUNITY): Payer: Medicare Other | Admitting: Licensed Clinical Social Worker

## 2022-10-29 NOTE — Telephone Encounter (Signed)
DOS - 11/26/22  HALLUX MPJ FUSION LEFT --- [place] [id number] Notification or Prior Authorization is not required for the requested services   Decision ID #: [id number]

## 2022-10-31 ENCOUNTER — Encounter (HOSPITAL_BASED_OUTPATIENT_CLINIC_OR_DEPARTMENT_OTHER): Payer: Self-pay | Admitting: Podiatry

## 2022-10-31 NOTE — Progress Notes (Signed)
Received pt's pcp H&P dated 10-29-2022 via fax from  Dr Posey Pronto office. Note on pcp H&P pt was cleared by cardiology for this surgery.  However, reviewed pt chart and she has EP ablation scheduled for 11-02-2022 and taking xarelto.  Lov note by cardiology , no clearance mentioned by Dr Jerilynn Mages. Harl Bowie in 10-24-2022 note  for surgery on 11-26-2022.  Pt will need clearance by cardiology prior surgery on 11-26-2022 and possible may need to reschedule since pt will be taking xarelto for few weeks post op.   Called and left message w/ OR scheduler , Shelly, if did not have cardiac clearance pt would need to reschedule due to being on xarelto few weeks after her ablation with clearance, call back if any questions.

## 2022-11-01 NOTE — Pre-Procedure Instructions (Signed)
Instructed patient on the following items: Arrival time 0830 Nothing to eat or drink after midnight No meds AM of procedure Responsible person to drive you home and stay with you for 24 hrs  Have you missed any doses of anti-coagulant Xarelto-hasn't missed an doses

## 2022-11-02 ENCOUNTER — Encounter (HOSPITAL_COMMUNITY): Payer: Self-pay | Admitting: Cardiovascular Disease

## 2022-11-02 ENCOUNTER — Other Ambulatory Visit: Payer: Self-pay

## 2022-11-02 ENCOUNTER — Encounter (HOSPITAL_COMMUNITY)
Admission: RE | Disposition: A | Discharge status: Home / Self Care | Payer: Self-pay | Source: Home / Self Care | Attending: Cardiovascular Disease

## 2022-11-02 ENCOUNTER — Ambulatory Visit (HOSPITAL_BASED_OUTPATIENT_CLINIC_OR_DEPARTMENT_OTHER): Payer: 59 | Admitting: Anesthesiology

## 2022-11-02 ENCOUNTER — Ambulatory Visit (HOSPITAL_COMMUNITY)
Admission: RE | Admit: 2022-11-02 | Discharge: 2022-11-02 | Disposition: A | Discharge status: Home / Self Care | Payer: 59 | Attending: Cardiovascular Disease | Admitting: Cardiovascular Disease

## 2022-11-02 ENCOUNTER — Ambulatory Visit (HOSPITAL_COMMUNITY): Payer: 59 | Admitting: Anesthesiology

## 2022-11-02 DIAGNOSIS — G4730 Sleep apnea, unspecified: Secondary | ICD-10-CM | POA: Diagnosis not present

## 2022-11-02 DIAGNOSIS — Z6841 Body Mass Index (BMI) 40.0 and over, adult: Secondary | ICD-10-CM | POA: Diagnosis not present

## 2022-11-02 DIAGNOSIS — J45909 Unspecified asthma, uncomplicated: Secondary | ICD-10-CM | POA: Diagnosis not present

## 2022-11-02 DIAGNOSIS — Z7901 Long term (current) use of anticoagulants: Secondary | ICD-10-CM | POA: Insufficient documentation

## 2022-11-02 DIAGNOSIS — E119 Type 2 diabetes mellitus without complications: Secondary | ICD-10-CM | POA: Insufficient documentation

## 2022-11-02 DIAGNOSIS — I4892 Unspecified atrial flutter: Secondary | ICD-10-CM | POA: Insufficient documentation

## 2022-11-02 DIAGNOSIS — I509 Heart failure, unspecified: Secondary | ICD-10-CM | POA: Diagnosis not present

## 2022-11-02 DIAGNOSIS — Z86711 Personal history of pulmonary embolism: Secondary | ICD-10-CM | POA: Insufficient documentation

## 2022-11-02 DIAGNOSIS — I4891 Unspecified atrial fibrillation: Secondary | ICD-10-CM | POA: Diagnosis not present

## 2022-11-02 DIAGNOSIS — I5032 Chronic diastolic (congestive) heart failure: Secondary | ICD-10-CM | POA: Insufficient documentation

## 2022-11-02 DIAGNOSIS — Z8673 Personal history of transient ischemic attack (TIA), and cerebral infarction without residual deficits: Secondary | ICD-10-CM | POA: Insufficient documentation

## 2022-11-02 DIAGNOSIS — I483 Typical atrial flutter: Secondary | ICD-10-CM

## 2022-11-02 HISTORY — PX: A-FLUTTER ABLATION: EP1230

## 2022-11-02 LAB — GLUCOSE, CAPILLARY
Glucose-Capillary: 94 mg/dL (ref 70–99)
Glucose-Capillary: 107 mg/dL — ABNORMAL HIGH (ref 70–99)

## 2022-11-02 SURGERY — A-FLUTTER ABLATION
Anesthesia: General

## 2022-11-02 MED ORDER — ROCURONIUM BROMIDE 10 MG/ML (PF) SYRINGE
PREFILLED_SYRINGE | INTRAVENOUS | Status: DC | PRN
  Administered 2022-11-02: 60 mg via INTRAVENOUS
  Administered 2022-11-02: 10 mg via INTRAVENOUS

## 2022-11-02 MED ORDER — ONDANSETRON HCL 4 MG/2ML IJ SOLN
INTRAMUSCULAR | Status: DC | PRN
  Administered 2022-11-02: 4 mg via INTRAVENOUS

## 2022-11-02 MED ORDER — MIDAZOLAM HCL 2 MG/2ML IJ SOLN
INTRAMUSCULAR | Status: DC | PRN
  Administered 2022-11-02: 2 mg via INTRAVENOUS

## 2022-11-02 MED ORDER — SODIUM CHLORIDE 0.9% FLUSH: 3.0000 mL | INTRAVENOUS | Status: DC | PRN

## 2022-11-02 MED ORDER — PHENYLEPHRINE HCL-NACL 20-0.9 MG/250ML-% IV SOLN
INTRAVENOUS | Status: DC | PRN
  Administered 2022-11-02: 50 ug/min via INTRAVENOUS

## 2022-11-02 MED ORDER — FENTANYL CITRATE (PF) 100 MCG/2ML IJ SOLN
INTRAMUSCULAR | Status: AC
  Filled 2022-11-02: qty 2

## 2022-11-02 MED ORDER — SUGAMMADEX SODIUM 200 MG/2ML IV SOLN
INTRAVENOUS | Status: DC | PRN
  Administered 2022-11-02: 400 mg via INTRAVENOUS

## 2022-11-02 MED ORDER — PROPOFOL 10 MG/ML IV BOLUS
INTRAVENOUS | Status: DC | PRN
  Administered 2022-11-02: 140 mg via INTRAVENOUS

## 2022-11-02 MED ORDER — MIDAZOLAM HCL 2 MG/2ML IJ SOLN
INTRAMUSCULAR | Status: AC
  Filled 2022-11-02: qty 2

## 2022-11-02 MED ORDER — ACETAMINOPHEN 325 MG PO TABS: 650.0000 mg | ORAL_TABLET | Freq: Once | ORAL | Status: DC

## 2022-11-02 MED ORDER — FENTANYL CITRATE (PF) 250 MCG/5ML IJ SOLN
INTRAMUSCULAR | Status: DC | PRN
  Administered 2022-11-02: 50 ug via INTRAVENOUS

## 2022-11-02 MED ORDER — HEPARIN SODIUM (PORCINE) 1000 UNIT/ML IJ SOLN
INTRAMUSCULAR | Status: AC
  Filled 2022-11-02: qty 1

## 2022-11-02 MED ORDER — LIDOCAINE 2% (20 MG/ML) 5 ML SYRINGE
INTRAMUSCULAR | Status: DC | PRN
  Administered 2022-11-02: 100 mg via INTRAVENOUS

## 2022-11-02 MED ORDER — SODIUM CHLORIDE 0.9 % IV SOLN: INTRAVENOUS | Status: DC

## 2022-11-02 MED ORDER — HEPARIN SODIUM (PORCINE) 1000 UNIT/ML IJ SOLN
INTRAMUSCULAR | Status: DC | PRN
  Administered 2022-11-02: 1000 [IU] via INTRAVENOUS

## 2022-11-02 MED ORDER — SODIUM CHLORIDE 0.9 % IV SOLN: 250.0000 mL | INTRAVENOUS | Status: DC | PRN

## 2022-11-02 MED ORDER — ONDANSETRON HCL 4 MG/2ML IJ SOLN: 4.0000 mg | Freq: Once | INTRAMUSCULAR | Status: DC

## 2022-11-02 MED ORDER — ONDANSETRON HCL 4 MG/2ML IJ SOLN
4.0000 mg | Freq: Four times a day (QID) | INTRAMUSCULAR | Status: DC | PRN
  Administered 2022-11-02: 4 mg via INTRAVENOUS
  Filled 2022-11-02: qty 2

## 2022-11-02 MED ORDER — PHENYLEPHRINE 80 MCG/ML (10ML) SYRINGE FOR IV PUSH (FOR BLOOD PRESSURE SUPPORT)
PREFILLED_SYRINGE | INTRAVENOUS | Status: DC | PRN
  Administered 2022-11-02: 160 ug via INTRAVENOUS
  Administered 2022-11-02: 240 ug via INTRAVENOUS

## 2022-11-02 MED ORDER — SODIUM CHLORIDE 0.9% FLUSH: 3.0000 mL | Freq: Two times a day (BID) | INTRAVENOUS | Status: DC

## 2022-11-02 MED ORDER — DEXAMETHASONE SODIUM PHOSPHATE 10 MG/ML IJ SOLN
INTRAMUSCULAR | Status: DC | PRN
  Administered 2022-11-02: 5 mg via INTRAVENOUS

## 2022-11-02 MED ORDER — ACETAMINOPHEN 500 MG PO TABS
1000.0000 mg | ORAL_TABLET | Freq: Once | ORAL | Status: AC
  Administered 2022-11-02: 1000 mg via ORAL
  Filled 2022-11-02: qty 2

## 2022-11-02 MED ORDER — HEPARIN (PORCINE) IN NACL 1000-0.9 UT/500ML-% IV SOLN
INTRAVENOUS | Status: DC | PRN
  Administered 2022-11-02: 500 mL

## 2022-11-02 SURGICAL SUPPLY — 14 items
CATH 8FR REPROCESSED SOUNDSTAR (CATHETERS) ×1 IMPLANT
CATH 8FR SOUNDSTAR REPROCESSED (CATHETERS) IMPLANT
CATH SMTCH THERMOCOOL SF FJ (CATHETERS) IMPLANT
CATH WEB BI DIR CSDF CRV REPRO (CATHETERS) IMPLANT
DEVICE CLOSURE MYNXGRIP 6/7F (Vascular Products) IMPLANT
MAT PREVALON FULL STRYKER (MISCELLANEOUS) IMPLANT
PACK EP LATEX FREE (CUSTOM PROCEDURE TRAY) ×1
PACK EP LF (CUSTOM PROCEDURE TRAY) ×1 IMPLANT
PAD DEFIB RADIO PHYSIO CONN (PAD) ×1 IMPLANT
PATCH CARTO3 (PAD) IMPLANT
SHEATH PINNACLE 8F 10CM (SHEATH) IMPLANT
SHEATH PINNACLE VASC 9FR (SHEATH) IMPLANT
SHEATH PROBE COVER 6X72 (BAG) IMPLANT
TUBING SMART ABLATE COOLFLOW (TUBING) IMPLANT

## 2022-11-02 NOTE — Anesthesia Procedure Notes (Addendum)
Procedure Name: Intubation Date/Time: 11/02/2022 10:29 AM  Performed by: Santa Lighter, MDPre-anesthesia Checklist: Patient identified, Emergency Drugs available, Suction available, Timeout performed and Patient being monitored Patient Re-evaluated:Patient Re-evaluated prior to induction Oxygen Delivery Method: Circle system utilized Preoxygenation: Pre-oxygenation with 100% oxygen Induction Type: IV induction Ventilation: Mask ventilation without difficulty Laryngoscope Size: Mac and 3 Grade View: Grade I Tube type: Oral Tube size: 7.0 mm Airway Equipment and Method: Stylet Placement Confirmation: ETT inserted through vocal cords under direct vision, positive ETCO2, CO2 detector and breath sounds checked- equal and bilateral Secured at: 23 cm Tube secured with: Tape Dental Injury: Teeth and Oropharynx as per pre-operative assessment

## 2022-11-02 NOTE — H&P (Signed)
Electrophysiology Office Note:    Date:  11/02/2022   ID:  Meghan [name], DOB May 20, 1966, MRN [id number]  PCP:  Beverley Fiedler, [place] Providers Cardiologist:  Janina Mayo, MD Electrophysiologist:  Melida Quitter, MD     Referring MD: No ref. provider found   History of Present Illness:    Meghan [name] is a 57 y.o. female with a hx listed below, significant for multiple PEs now s/p IVC filter, stroke, HF with recovered EF, AF referred for arrhythmia management. I reviewed Dr. Nelly Laurence note from 07/19/22.  I reviewed all ECGs available in [facility]. With the exception of one ECG in 2020 that shows atrial fibrillation, all other ECGs show typical atrial flutter. She has had several ER visits with atrial flutter since arriving to the area in 2020. She is in flutter today and scheduled for DC cardioversion tomorrow.  She has shortness of breath and fatigue, palpitations. No chest pain, syncope, pre-syncope.  She has not had any significant change since her last clinic visit with me. Last dose of xarelto was last night.  Past Medical History:  Diagnosis Date   Anticoagulated    xarelto--- managed by cardiology   Anxiety    Arthritis    left knee   Asthma    Atrial fibrillation ([place]) 02/16/2019   CHF (congestive heart failure) ([place]) 01/2019   Physician from [place] informed her she had this    Diabetes mellitus without complication ([place])    Dysrhythmia    Atrial fibrillation   GERD (gastroesophageal reflux disease)    Hiatal hernia    History of pulmonary embolus (PE)    Hypercholesterolemia    Intractable nausea and vomiting 11/16/2020   Macular degeneration    of left eye   MDD (major depressive disorder)    Osteoarthritis    knees   Pneumonia    in the past   PTSD (post-traumatic stress disorder)    Pulmonary embolism ([place])    bilateral, none since 2003   Seizure disorder ([place])    Per patient last seizure was in 2020.   Sleep apnea CPAP    [facility]  Uses CPAP   Stroke ([place])    1989 and 1995 (left sided weakness)   Vitamin D deficiency     Past Surgical History:  Procedure Laterality Date   ABDOMINAL HYSTERECTOMY  2003   complete   CARDIAC CATHETERIZATION  2017   in [facility] in [place] N/A 08/22/2022   Procedure: CARDIOVERSION;  Surgeon: Berniece Salines, DO;  Location: [place];  Service: Cardiovascular;  Laterality: N/A;   CESAREAN SECTION  [id number]   x 2   CHOLECYSTECTOMY  2003   COLONOSCOPY WITH PROPOFOL N/A 06/05/2019   Procedure: COLONOSCOPY WITH PROPOFOL;  Surgeon: Carol Ada, MD;  [facility];  Service: Endoscopy;  Laterality: N/A;   CYST REMOVAL WITH BONE GRAFT Left 10/18/2020   Procedure: BONE GRAFTING OF ENCHONDROMA MIDDLE [place] OF LEFT MIDDLE FINGER;  Surgeon: Daryll Brod, MD;  Location: [place];  Service: Orthopedics;  Laterality: Left;  AXILLARY BLOCK   DIAGNOSTIC LAPAROSCOPY  2015; 2017   lap hernia repair x2   endoscopy     ENDOVENOUS ABLATION SAPHENOUS VEIN W/ LASER Left 08/31/2021   endovenous laser ablation left greater saphenous vein and stab phlebectomy 10-20 incisions left leg by Servando Snare MD   [place] Right 05/28/2022   Procedure: HALLUX FUSION METATARSAL PHALANGEAL JOIT;  Surgeon: Posey Pronto,  Thomasene Lot, DPM;  Location: [place];  Service: Podiatry;  Laterality: Right;  BLOCK   HERNIA REPAIR  [id number], [id number]   umbilical hernia repair   IVC FILTER INSERTION  2003   Hx pulmonary embolus   POLYPECTOMY  06/05/2019   Procedure: POLYPECTOMY;  Surgeon: Carol Ada, MD;  [facility];  Service: Endoscopy;;   SHOULDER ARTHROSCOPY W/ ROTATOR CUFF REPAIR  08/28/2017   right shoulder   WRIST ARTHROSCOPY WITH DEBRIDEMENT Left 10/18/2020   Procedure: LEFT WRIST ARTHROSCOPY WITH DEBRIDEMENT;  Surgeon: Daryll Brod, MD;  Location: [place];  Service: Orthopedics;  Laterality: Left;  AXILLARY BLOCK   WRIST ARTHROSCOPY WITH DEBRIDEMENT Right  01/25/2022   Procedure: ARTHROSCOPY RIGHT WRIST WITH  DEBRIDEMENT/ SHRINKAGE;  Surgeon: Leanora Cover, MD;  Location: [name];  Service: Orthopedics;  Laterality: Right;    Current Medications: Current Meds  Medication Sig   acetaminophen (TYLENOL) 500 MG tablet Take 1,000 mg by mouth every 6 (six) hours as needed for moderate pain.   albuterol (VENTOLIN HFA) 108 (90 Base) MCG/ACT inhaler Inhale 2 puffs into the lungs every 6 (six) hours as needed for wheezing or shortness of breath.   atorvastatin (LIPITOR) 40 MG tablet Take 40 mg by mouth every evening.   BREZTRI AEROSPHERE 160-9-4.8 MCG/ACT AERO Inhale 2 puffs into the lungs in the morning and at bedtime.    bumetanide (BUMEX) 1 MG tablet Take 1 mg by mouth in the morning.   Cholecalciferol (VITAMIN D-3) 125 MCG (5000 UT) TABS Take 5,000 Units by mouth daily.   flecainide (TAMBOCOR) 50 MG tablet Take 50 mg by mouth 2 (two) times daily.    Menthol-Camphor (TIGER BALM ARTHRITIS RUB) 11-11 % CREA Apply 1 application  topically daily as needed (Knee pain).   metFORMIN (GLUCOPHAGE) 500 MG tablet Take 500 mg by mouth daily with breakfast.   metoprolol succinate (TOPROL-XL) 25 MG 24 hr tablet Take 1 tablet (25 mg total) by mouth daily. Take with or immediately following a meal.   Omega-3 Fatty Acids (FISH OIL) 1200 MG CAPS Take 1,200 mg by mouth daily with breakfast.   omeprazole (PRILOSEC) 40 MG capsule Take 40 mg by mouth daily.   rivaroxaban (XARELTO) 20 MG TABS tablet Take 20 mg by mouth at bedtime.    RYBELSUS 14 MG TABS Take 14 mg by mouth in the morning.   sertraline (ZOLOFT) 50 MG tablet Take 50 mg by mouth in the morning.     Allergies:   Acetaminophen-codeine, Bee venom, Meloxicam, Tramadol, Codeine, Coconut (cocos nucifera), and Tomato   Social History   Socioeconomic History   Marital status: Divorced    Spouse name: Not on file   Number of children: 3   Years of education: Not on file   Highest education level: Not on file   Occupational History   Not on file  Tobacco Use   Smoking status: Never   Smokeless tobacco: Never  Vaping Use   Vaping Use: Never used  Substance and Sexual Activity   Alcohol use: Never   Drug use: Never   Sexual activity: Not Currently    Birth control/protection: Surgical    Comment: Hysterectomy  Other Topics Concern   Not on file  Social History Narrative   ** Merged History Encounter **       Social Determinants of Health   Financial Resource Strain: Not on file  Food Insecurity: Not on file  Transportation Needs: Not on file  Physical Activity: Not  on file  Stress: Not on file  Social Connections: Not on file     Family History: The patient's family history includes Breast cancer in her maternal grandmother and mother; Congestive Heart Failure in her father; Diabetes in her father; Heart attack in her father.  ROS:   Please see the history of present illness.    All other systems reviewed and are negative.  EKGs/Labs/Other Studies Reviewed Today:     TTE 07/2022: Normal EF, mildly dilated LA  EKG:  Last EKG results: today - Atrial flutter with variable conduction   Recent Labs: 11/27/2021: ALT 14 10/26/2022: BUN 15; Creatinine, Ser 0.84; Hemoglobin 11.2; Platelets 267; Potassium 3.8; Sodium 144     Physical Exam:    VS:  BP 107/71   Pulse 100   Temp 98.2 F (36.8 C)   Ht 5' 5"$  (1.651 m)   Wt (!) 139.3 kg   SpO2 99%   BMI 51.09 kg/m     Wt Readings from Last 3 Encounters:  11/02/22 (!) 139.3 kg  10/24/22 (!) 142 kg  10/08/22 (!) 139.3 kg     GEN:  Well nourished, well developed in no acute distress, obese CARDIAC: Irregular rhythm, no murmurs, rubs, gallops RESPIRATORY:  Normal work of breathing MUSCULOSKELETAL: 1+ edema    ASSESSMENT & PLAN:    Atrial flutter: multiple recurrences resulting in hospitalization and CHF. She presents today for ablation.  Atrial fibrillation: not documented on ECG since 2020. Will continue to monitor  after AF ablation as she continues to lose weight. Longterm use of anticoagulation: history of recurrent PE and stroke. She will be on life-long anticoagulation        Medication Adjustments/Labs and Tests Ordered: Current medicines are reviewed at length with the patient today.  Concerns regarding medicines are outlined above.  Orders Placed This Encounter  Procedures   Glucose, capillary   Informed Consent Details: Physician/Practitioner Attestation; Transcribe to consent form and obtain patient signature   Initiate Pre-op Protocol   [place] on call to EP Lab   Confirm CBC and BMP (or CMP) results within 7 days for inpatient and 30 days for outpatient:   Clip right and left femoral area PM before surgery   Clip right internal jugular area PM before surgery   Pre-admission testing diagnosis   EP STUDY   Insert peripheral IV   Meds ordered this encounter  Medications   0.9 %  sodium chloride infusion   acetaminophen (TYLENOL) tablet 1,000 mg     Signed, Melida Quitter, MD  11/02/2022 10:00 AM    [place]

## 2022-11-02 NOTE — Progress Notes (Signed)
Up and walked and tolerated well; right groin stable, no bleeding or hematoma 

## 2022-11-02 NOTE — Transfer of Care (Signed)
Immediate Anesthesia Transfer of Care Note  Patient: Meghan [name]  Procedure(s) Performed: A-FLUTTER ABLATION  Patient Location: PACU cath lab  Anesthesia Type:General  Level of Consciousness: awake, alert , and oriented  Airway & Oxygen Therapy: Patient Spontanous Breathing and Patient connected to nasal cannula oxygen  Post-op Assessment: Report given to RN, Post -op Vital signs reviewed and stable, and Patient moving all extremities X 4  Post vital signs: Reviewed and stable  Last Vitals:  Vitals Value Taken Time  BP    Temp    Pulse 79 11/02/22 1229  Resp 25 11/02/22 1229  SpO2 98 % 11/02/22 1229  Vitals shown include unvalidated device data.  Last Pain:  Vitals:   11/02/22 0844  PainSc: 0-No pain      Patients Stated Pain Goal: 0 ([id number])  Complications: No notable events documented.

## 2022-11-02 NOTE — Progress Notes (Signed)
Oda Kilts, PA in to see client and order for tylenol

## 2022-11-02 NOTE — Progress Notes (Addendum)
C/O nausea and small amt vomitus clear liquid; c/o 10/10 chest pain and 10/10 headache; Jonni Sanger, [place] notified and order for EKG and zofran

## 2022-11-02 NOTE — Anesthesia Preprocedure Evaluation (Signed)
Anesthesia Evaluation  Patient identified by MRN, date of birth, ID band Patient awake    Reviewed: Allergy & Precautions, NPO status , Patient's Chart, lab work & pertinent test results  Airway Mallampati: III  TM Distance: >3 FB Neck ROM: Full    Dental  (+) Dental Advisory Given, Missing,    Pulmonary asthma , sleep apnea and Continuous Positive Airway Pressure Ventilation , PE (2003)   Pulmonary exam normal breath sounds clear to auscultation       Cardiovascular +CHF  Normal cardiovascular exam+ dysrhythmias Atrial Fibrillation  Rhythm:Regular Rate:Normal     Neuro/Psych Seizures -, Well Controlled,  PSYCHIATRIC DISORDERS Anxiety Depression    CVA (L), Residual Symptoms    GI/Hepatic negative GI ROS, Neg liver ROS,,,  Endo/Other  diabetes, Type 2  Morbid obesity  Renal/GU negative Renal ROS     Musculoskeletal  (+) Arthritis ,    Abdominal   Peds  Hematology  (+) Blood dyscrasia (Xarelto)   Anesthesia Other Findings Day of surgery medications reviewed with the patient.  Reproductive/Obstetrics                              Anesthesia Physical Anesthesia Plan  ASA: 4  Anesthesia Plan: General   Post-op Pain Management: Tylenol PO (pre-op)*   Induction: Intravenous  PONV Risk Score and Plan: 3 and Dexamethasone, Ondansetron and Midazolam  Airway Management Planned: Oral ETT  Additional Equipment:   Intra-op Plan:   Post-operative Plan: Extubation in OR  Informed Consent: I have reviewed the patients History and Physical, chart, labs and discussed the procedure including the risks, benefits and alternatives for the proposed anesthesia with the patient or authorized representative who has indicated his/her understanding and acceptance.     Dental advisory given  Plan Discussed with: CRNA  Anesthesia Plan Comments:          Anesthesia Quick Evaluation

## 2022-11-05 ENCOUNTER — Telehealth: Payer: Self-pay | Admitting: Podiatry

## 2022-11-05 ENCOUNTER — Encounter (HOSPITAL_COMMUNITY): Payer: Self-pay | Admitting: Cardiovascular Disease

## 2022-11-05 ENCOUNTER — Ambulatory Visit (INDEPENDENT_AMBULATORY_CARE_PROVIDER_SITE_OTHER): Payer: 59 | Admitting: Licensed Clinical Social Worker

## 2022-11-05 DIAGNOSIS — F4310 Post-traumatic stress disorder, unspecified: Secondary | ICD-10-CM

## 2022-11-05 DIAGNOSIS — F411 Generalized anxiety disorder: Secondary | ICD-10-CM

## 2022-11-05 DIAGNOSIS — F331 Major depressive disorder, recurrent, moderate: Secondary | ICD-10-CM | POA: Diagnosis not present

## 2022-11-05 NOTE — Telephone Encounter (Signed)
Patient called she was fitted for shoes in November and she never got her diabetic shoes, checking the system there is no notes for her being fitted for shoes, no order in the system for her to get the New Balance.  Patient would like a call on getting this fixed.

## 2022-11-05 NOTE — Progress Notes (Signed)
Virtual Visit via Video Note   I connected with Meghan [name] on 11/05/22 at 1:00pm by video enabled telemedicine application and verified that I am speaking with the correct person using two identifiers.   I discussed the limitations, risks, security and privacy concerns of performing an evaluation and management service by video and the availability of in person appointments. I also discussed with the patient that there may be a patient responsible charge related to this service. The patient expressed understanding and agreed to proceed.   I discussed the assessment and treatment plan with the patient. The patient was provided an opportunity to ask questions and all were answered. The patient agreed with the plan and demonstrated an understanding of the instructions.   The patient was advised to call back or seek an in-person evaluation if the symptoms worsen or if the condition fails to improve as anticipated.   I provided 30 minutes of non-face-to-face time during this encounter.     [name], LCSW, LCAS _______________________ THERAPIST PROGRESS NOTE   Session Time:  1:00pm - 1:30pm   Location: Patient: Patient Home Provider: [facility] [place] [facility]   Participation Level: Active    Behavioral Response: Alert, casually dressed, euthymic mood/affect     Type of Therapy:  Individual Therapy   Treatment Goals addressed: Mood management; Medication management  Progress Towards Goals: Progressing    Interventions: CBT: challenging anxious thoughts, problem solving    Summary: Meghan [name] is a 57 year old divorced African American female that presented for therapy appointment today with diagnoses of Major Depressive Disorder, recurrent, moderate, Generalized Anxiety Disorder and PTSD.      Suicidal/Homicidal: None; without plan or intent                                                                                                                Therapist Response:  Clinician met with  Meghan [name] for virtual therapy session and assessed for safety, sobriety, and medication compliance.  Meghan [name] presented for appointment on time and was alert, oriented x5, with no evidence or self-report of active SI/HI or A/V H.  Meghan [name] reported ongoing compliance with medication and denied any use of alcohol or illicit substances.  Clinician inquired about Meghan [name]'s emotional ratings today, as well as any significant changes in thoughts, feelings, or behavior since last check-in.  Meghan [name] reported scores of 0/10 for depression, 0/10 for anxiety, and 0/10 for anger/irritability.  Meghan [name] denied any recent outbursts or panic attacks.  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  Clinician pointed out that Eleanna's eligibility for social security benefits might be compromised if she acquires employment, and utilized a problem solving approach to examine costs and benefits of this transition in order to ensure that this doesn't lead to loss of security.  Meghan [name] participated in analysis and reported that based upon what her case worker said, she will retain benefits if she works less than 20 hours per week.  She also reported that she is putting this money in a savings account to help her cope with any future financial challenges.  Meghan [name] reported that she cannot see any downsides to the transition at this time, since she enjoys the work, only does 6 hours per week, and finds purpose in it.  Meghan [name] reported that her only current stressor is worrying about an upcoming event at her church where she will dance for a particular song.  Clinician utilized handout in session today titled "Worry exploration" in order to assist Klarisa in reducing her anxiety related to upcoming church event.  This worksheet featured a series of Socratic questions aimed at exploring the most likely outcomes for a situation of concern, rather than focusing on the worst possible outcome (i.e.  catastrophizing).  Clinician assisted Kynsley in identifying and challenging any irrational beliefs related to this worry, in addition to utilizing problem solving approach to explore strategies which would help her accomplish goal of dancing in front of the congregation without issue.  Meghan [name] actively participated in discussion on handout, reporting that she worried about messing up the routine, or cancelling.  Meghan [name] reported that there is sufficient evidence to suggest she will perform well though, as her leader specifically chose her based upon her experience, and she has practiced this regularly with the team.  Intervention was effective, as evidenced by Meghan [name] reporting that discussion on this subject reduced her anxiety about upcoming challenge, and reminded her of coping skills that could be utilized to manage stress when time comes to perform.  She stated "I also talked to my sister and friends about it, and they told me I'd be alright.  I've got this".  Clinician will continue to monitor.            Plan: Follow up again in 1 week.    Diagnosis: Major depressive disorder, recurrent, moderate; Generalized Anxiety Disorder; and PTSD   Collaboration of Care:   No collaboration of care required for this visit.                                                   Patient/Guardian was advised Release of Information must be obtained prior to any record release in order to collaborate their care with an outside provider. Patient/Guardian was advised if they have not already done so to contact the registration department to sign all necessary forms in order for [name] to release information regarding their care.    Consent: Patient/Guardian gives verbal consent for treatment and assignment of benefits for services provided during this visit. Patient/Guardian expressed understanding and agreed to proceed.  [name], LCSW, LCAS 11/05/22

## 2022-11-05 NOTE — Anesthesia Postprocedure Evaluation (Signed)
Anesthesia Post Note  Patient: Meghan [name]  Procedure(s) Performed: A-FLUTTER ABLATION     Patient location during evaluation: Cath Lab Anesthesia Type: General Level of consciousness: awake and alert Pain management: pain level controlled Vital Signs Assessment: post-procedure vital signs reviewed and stable Respiratory status: spontaneous breathing, nonlabored ventilation, respiratory function stable and patient connected to nasal cannula oxygen Cardiovascular status: blood pressure returned to baseline and stable Postop Assessment: no apparent nausea or vomiting Anesthetic complications: no   No notable events documented.  Last Vitals:  Vitals:   11/02/22 1500 11/02/22 1600  BP: 108/77 104/69  Pulse: 75 80  Resp: 19 15  Temp:    SpO2: 97% 96%    Last Pain:  Vitals:   11/02/22 1315  TempSrc:   PainSc: 0-No pain                 Santa Lighter

## 2022-11-06 ENCOUNTER — Encounter: Payer: Self-pay | Admitting: Internal Medicine

## 2022-11-06 ENCOUNTER — Telehealth: Payer: Self-pay | Admitting: Cardiovascular Disease

## 2022-11-06 NOTE — Telephone Encounter (Signed)
Error

## 2022-11-06 NOTE — Telephone Encounter (Signed)
Pt c/o swelling: STAT is pt has developed SOB within 24 hours  How much weight have you gained and in what time span?  Patient doesn't think she gained any weight  If swelling, where is the swelling located?  Swelling in soreness in upper thighs since 2/16 procedure with Dr. Myles Gip.  Are you currently taking a fluid pill?  Yes   Are you currently SOB?  No   Do you have a log of your daily weights (if so, list)?   Have you gained 3 pounds in a day or 5 pounds in a week?   Have you traveled recently?  No

## 2022-11-06 NOTE — Telephone Encounter (Signed)
Returned call to patient. Patient reports over the last couple of days she has developed swelling, redness, and soreness in upper thighs, right leg worse than left. Patient had a-flutter ablation performed on 11/02/22. Patient denies any fever, warmth or drainage. Patient reports soreness more present when walking or bending. Requests groin site assessment due to concern for infection. Appt with Oda Kilts, PA-C scheduled for 11/09/22 to assess. Advised patient to go to urgent care or ED if symptoms worsen. Patient verbalized understanding.

## 2022-11-07 ENCOUNTER — Encounter (HOSPITAL_COMMUNITY): Payer: Self-pay

## 2022-11-07 ENCOUNTER — Emergency Department (HOSPITAL_COMMUNITY): Payer: 59

## 2022-11-07 ENCOUNTER — Emergency Department (HOSPITAL_BASED_OUTPATIENT_CLINIC_OR_DEPARTMENT_OTHER)
Admit: 2022-11-07 | Discharge: 2022-11-07 | Disposition: A | Discharge status: Home / Self Care | Payer: 59 | Attending: Physician Assistant | Admitting: Physician Assistant

## 2022-11-07 ENCOUNTER — Ambulatory Visit: Payer: Self-pay

## 2022-11-07 ENCOUNTER — Ambulatory Visit (HOSPITAL_COMMUNITY)
Admission: RE | Admit: 2022-11-07 | Discharge: 2022-11-07 | Disposition: A | Discharge status: Home / Self Care | Payer: 59 | Source: Ambulatory Visit | Attending: Endocrinology | Admitting: Endocrinology

## 2022-11-07 ENCOUNTER — Other Ambulatory Visit: Payer: Self-pay

## 2022-11-07 ENCOUNTER — Observation Stay (HOSPITAL_COMMUNITY)
Admission: EM | Admit: 2022-11-07 | Discharge: 2022-11-08 | Disposition: A | Discharge status: Home / Self Care | Payer: 59 | Attending: Internal Medicine | Admitting: Internal Medicine

## 2022-11-07 VITALS — BP 105/70 | HR 80 | Temp 99.1°F | Resp 18

## 2022-11-07 DIAGNOSIS — I509 Heart failure, unspecified: Secondary | ICD-10-CM | POA: Diagnosis not present

## 2022-11-07 DIAGNOSIS — Z7901 Long term (current) use of anticoagulants: Secondary | ICD-10-CM | POA: Insufficient documentation

## 2022-11-07 DIAGNOSIS — Z8673 Personal history of transient ischemic attack (TIA), and cerebral infarction without residual deficits: Secondary | ICD-10-CM | POA: Insufficient documentation

## 2022-11-07 DIAGNOSIS — E119 Type 2 diabetes mellitus without complications: Secondary | ICD-10-CM | POA: Insufficient documentation

## 2022-11-07 DIAGNOSIS — I4891 Unspecified atrial fibrillation: Secondary | ICD-10-CM | POA: Insufficient documentation

## 2022-11-07 DIAGNOSIS — J45909 Unspecified asthma, uncomplicated: Secondary | ICD-10-CM | POA: Insufficient documentation

## 2022-11-07 DIAGNOSIS — I110 Hypertensive heart disease with heart failure: Secondary | ICD-10-CM | POA: Insufficient documentation

## 2022-11-07 DIAGNOSIS — I999 Unspecified disorder of circulatory system: Secondary | ICD-10-CM | POA: Diagnosis present

## 2022-11-07 DIAGNOSIS — R1030 Lower abdominal pain, unspecified: Secondary | ICD-10-CM | POA: Diagnosis present

## 2022-11-07 DIAGNOSIS — I97638 Postprocedural hematoma of a circulatory system organ or structure following other circulatory system procedure: Secondary | ICD-10-CM | POA: Diagnosis not present

## 2022-11-07 DIAGNOSIS — S301XXA Contusion of abdominal wall, initial encounter: Principal | ICD-10-CM | POA: Insufficient documentation

## 2022-11-07 DIAGNOSIS — R52 Pain, unspecified: Secondary | ICD-10-CM

## 2022-11-07 DIAGNOSIS — T82837A Hemorrhage of cardiac prosthetic devices, implants and grafts, initial encounter: Secondary | ICD-10-CM

## 2022-11-07 DIAGNOSIS — Z79899 Other long term (current) drug therapy: Secondary | ICD-10-CM | POA: Insufficient documentation

## 2022-11-07 DIAGNOSIS — X58XXXA Exposure to other specified factors, initial encounter: Secondary | ICD-10-CM | POA: Diagnosis not present

## 2022-11-07 LAB — CBC WITH DIFFERENTIAL/PLATELET
Abs Immature Granulocytes: 0.02 10*3/uL (ref 0.00–0.07)
Basophils Absolute: 0 10*3/uL (ref 0.0–0.1)
Basophils Relative: 0 %
Eosinophils Absolute: 0.2 10*3/uL (ref 0.0–0.5)
Eosinophils Relative: 2 %
HCT: 32.9 % — ABNORMAL LOW (ref 36.0–46.0)
Hemoglobin: 10.4 g/dL — ABNORMAL LOW (ref 12.0–15.0)
Immature Granulocytes: 0 %
Lymphocytes Relative: 23 %
Lymphs Abs: 1.6 10*3/uL (ref 0.7–4.0)
MCH: 27 pg (ref 26.0–34.0)
MCHC: 31.6 g/dL (ref 30.0–36.0)
MCV: 85.5 fL (ref 80.0–100.0)
Monocytes Absolute: 0.7 10*3/uL (ref 0.1–1.0)
Monocytes Relative: 9 %
Neutro Abs: 4.6 10*3/uL (ref 1.7–7.7)
Neutrophils Relative %: 66 %
Platelets: 249 10*3/uL (ref 150–400)
RBC: 3.85 MIL/uL — ABNORMAL LOW (ref 3.87–5.11)
RDW: 14.6 % (ref 11.5–15.5)
WBC: 7 10*3/uL (ref 4.0–10.5)
nRBC: 0 % (ref 0.0–0.2)

## 2022-11-07 LAB — PROTIME-INR
INR: 1.1 (ref 0.8–1.2)
Prothrombin Time: 13.8 seconds (ref 11.4–15.2)

## 2022-11-07 LAB — BASIC METABOLIC PANEL
Anion gap: 6 (ref 5–15)
BUN: 9 mg/dL (ref 6–20)
CO2: 28 mmol/L (ref 22–32)
Calcium: 9.7 mg/dL (ref 8.9–10.3)
Chloride: 104 mmol/L (ref 98–111)
Creatinine, Ser: 0.79 mg/dL (ref 0.44–1.00)
GFR, Estimated: >60 mL/min (ref >60)
Glucose, Bld: 92 mg/dL (ref 70–99)
Potassium: 3.5 mmol/L (ref 3.5–5.1)
Sodium: 138 mmol/L (ref 135–145)

## 2022-11-07 MED ORDER — HYDROMORPHONE HCL 1 MG/ML IJ SOLN
1.0000 mg | Freq: Once | INTRAMUSCULAR | Status: AC
  Administered 2022-11-07: 1 mg via INTRAVENOUS
  Filled 2022-11-07: qty 1

## 2022-11-07 MED ORDER — NITROGLYCERIN 0.4 MG SL SUBL: 0.4000 mg | SUBLINGUAL_TABLET | SUBLINGUAL | Status: DC | PRN

## 2022-11-07 MED ORDER — IOHEXOL 350 MG/ML SOLN
75.0000 mL | Freq: Once | INTRAVENOUS | Status: AC | PRN
  Administered 2022-11-07: 75 mL via INTRAVENOUS

## 2022-11-07 MED ORDER — ONDANSETRON HCL 4 MG/2ML IJ SOLN: 4.0000 mg | Freq: Four times a day (QID) | INTRAMUSCULAR | Status: DC | PRN

## 2022-11-07 MED ORDER — FENTANYL CITRATE PF 50 MCG/ML IJ SOSY
50.0000 ug | PREFILLED_SYRINGE | Freq: Once | INTRAMUSCULAR | Status: AC
  Administered 2022-11-07: 50 ug via INTRAVENOUS
  Filled 2022-11-07: qty 1

## 2022-11-07 NOTE — ED Triage Notes (Signed)
R groin pain. Hard lump felt on right groin. Recent ablation 2/16. Pt reports increasing pain in the area. Pt able to walk

## 2022-11-07 NOTE — ED Notes (Signed)
Pt to CT scanner at this time 

## 2022-11-07 NOTE — ED Notes (Signed)
Patient is being discharged from the Urgent Care and sent to the Emergency Department via [place]. Per Mare Ferrari, NP, patient is in need of higher level of care due to pain and swelling at femoral site where had a-flutter ablation on 11/02/22. Patient is aware and verbalizes understanding of plan of care.  Vitals:   11/07/22 1416  BP: 105/70  Pulse: 80  Resp: 18  Temp: 99.1 F (37.3 C)  SpO2: 98%

## 2022-11-07 NOTE — ED Triage Notes (Signed)
Pt c/o bilat ear pain as well since Saturday.

## 2022-11-07 NOTE — ED Provider Notes (Signed)
[place]    CSN: [id number] Arrival date & time: 11/07/22  1401      History   Chief Complaint Chief Complaint  Patient presents with   appt 2   Post-op Problem    HPI Meghan [name] is a 57 y.o. female.   Patient presents to urgent care for evaluation of swelling and soreness to the right groin that started a couple of days ago.  Patient had an atrial flutter ablation at [facility] by Dr. Myles Gip on Friday, November 02, 2022 (5 days ago).  Patient is anticoagulated with Xarelto and states that she did not have a hematoma after the procedure.  She laid flat for 4 hours after the procedure.  Her site was closed with a Mynx closure device.  She followed instructions and remained flat for the rest of the day after her procedure.  Swelling began a couple of days ago and has worsened since onset.  Area to the right groin is very sore and the soreness extends to the right thigh.  She denies seeing any bruising or erythema/drainage to the right groin or to the abdomen/low back.  Denies abdominal pain, nausea, vomiting, numbness and tingling to the right lower extremity, color change to the right lower extremity, and recent falls.  She states the pain is worse with walking and pressure to the right groin area.  She has been taking her blood thinner as prescribed.  She called her cardiologist office this morning but they are unable to get her in for an appointment until February 23.  Unclear if femoral access was venous, arterial, or both for the procedure.     Past Medical History:  Diagnosis Date   Anticoagulated    xarelto--- managed by cardiology   Anxiety    Arthritis    left knee   Asthma    Atrial fibrillation ([place]) 02/16/2019   CHF (congestive heart failure) ([place]) 01/2019   Physician from [place] informed her she had this    Diabetes mellitus without complication ([place])    Dysrhythmia    Atrial fibrillation   GERD (gastroesophageal reflux disease)    Hiatal hernia     History of pulmonary embolus (PE)    Hypercholesterolemia    Intractable nausea and vomiting 11/16/2020   Macular degeneration    of left eye   MDD (major depressive disorder)    Osteoarthritis    knees   Pneumonia    in the past   PTSD (post-traumatic stress disorder)    Pulmonary embolism ([place])    bilateral, none since 2003   Seizure disorder ([place])    Per patient last seizure was in 2020.   Sleep apnea CPAP   [facility]  Uses CPAP   Stroke ([place])    1989 and 1995 (left sided weakness)   Vitamin D deficiency     Patient Active Problem List   Diagnosis Date Noted   MDD (major depressive disorder), recurrent episode, moderate ([place]) 07/30/2022   GAD (generalized anxiety disorder) 07/30/2022   PTSD (post-traumatic stress disorder) 07/30/2022   Insomnia 05/24/2022   Arthritis of first metatarsophalangeal (MTP) joint of right foot 04/25/2022   Intractable nausea and vomiting 11/16/2020   Ileus ([place]) 11/14/2020   Fecal impaction ([place]) 11/14/2020   Left-sided weakness 09/29/2020   Asthma 09/29/2020   Pre-diabetes 09/29/2020   Pain in left finger(s) 07/12/2020   Seizure-like activity ([place]) 03/25/2020   History of head injury 03/25/2020   Obstructive sleep apnea syndrome 09/04/2019  Acute medial meniscus tear of right knee 07/10/2019   Class 3 severe obesity due to excess calories with serious comorbidity and body mass index (BMI) of 50.0 to 59.9 in adult [facility]) 06/01/2019   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   Atrial fibrillation ([place]) 04/17/2019   History of pulmonary embolus ([place]) 04/17/2019    Past Surgical History:  Procedure Laterality Date   A-FLUTTER ABLATION N/A 11/02/2022   Procedure: A-FLUTTER ABLATION;  Surgeon: Melida Quitter, MD;  Location: [place] [facility];  Service: Cardiovascular;  Laterality: N/A;   ABDOMINAL HYSTERECTOMY  2003   complete   CARDIAC CATHETERIZATION  2017   in [facility] in [employer] N/A 08/22/2022    Procedure: CARDIOVERSION;  Surgeon: Berniece Salines, DO;  Location: [facility];  Service: Cardiovascular;  Laterality: N/A;   CESAREAN SECTION  [id number]   x 2   CHOLECYSTECTOMY  2003   COLONOSCOPY WITH PROPOFOL N/A 06/05/2019   Procedure: COLONOSCOPY WITH PROPOFOL;  Surgeon: Carol Ada, MD;  Location: [facility];  Service: Endoscopy;  Laterality: N/A;   CYST REMOVAL WITH BONE GRAFT Left 10/18/2020   Procedure: BONE GRAFTING OF ENCHONDROMA MIDDLE [place] OF LEFT MIDDLE FINGER;  Surgeon: Daryll Brod, MD;  Location: [place];  Service: Orthopedics;  Laterality: Left;  AXILLARY BLOCK   DIAGNOSTIC LAPAROSCOPY  2015; 2017   lap hernia repair x2   endoscopy     ENDOVENOUS ABLATION SAPHENOUS VEIN W/ LASER Left 08/31/2021   endovenous laser ablation left greater saphenous vein and stab phlebectomy 10-20 incisions left leg by Servando Snare MD   [place] Right 05/28/2022   Procedure: HALLUX FUSION METATARSAL PHALANGEAL JOIT;  Surgeon: [name], DPM;  Location: [facility];  Service: Podiatry;  Laterality: Right;  BLOCK   HERNIA REPAIR  [id number], [id number]   umbilical hernia repair   IVC FILTER INSERTION  2003   Hx pulmonary embolus   POLYPECTOMY  06/05/2019   Procedure: POLYPECTOMY;  Surgeon: Carol Ada, MD;  [facility]: [facility];  Service: Endoscopy;;   SHOULDER ARTHROSCOPY W/ ROTATOR CUFF REPAIR  08/28/2017   right shoulder   WRIST ARTHROSCOPY WITH DEBRIDEMENT Left 10/18/2020   Procedure: LEFT WRIST ARTHROSCOPY WITH DEBRIDEMENT;  Surgeon: Daryll Brod, MD;  Location: [facility];  Service: Orthopedics;  Laterality: Left;  AXILLARY BLOCK   WRIST ARTHROSCOPY WITH DEBRIDEMENT Right 01/25/2022   Procedure: ARTHROSCOPY RIGHT WRIST WITH  DEBRIDEMENT/ SHRINKAGE;  Surgeon: Leanora Cover, MD;  Location: [facility];  Service: Orthopedics;  Laterality: Right;    OB History   No obstetric history on file.      Home Medications    Prior to Admission medications   Medication Sig Start Date  End Date Taking? Authorizing Provider  acetaminophen (TYLENOL) 500 MG tablet Take 1,000 mg by mouth every 6 (six) hours as needed for moderate pain.    [provider]  albuterol (VENTOLIN HFA) 108 (90 Base) MCG/ACT inhaler Inhale 2 puffs into the lungs every 6 (six) hours as needed for wheezing or shortness of breath.    [provider]  atorvastatin (LIPITOR) 40 MG tablet Take 40 mg by mouth every evening. 07/26/21   [provider]  BREZTRI AEROSPHERE 160-9-4.8 MCG/ACT AERO Inhale 2 puffs into the lungs in the morning and at bedtime.  03/24/20   [provider]  bumetanide (BUMEX) 1 MG tablet Take 1 mg by mouth in the morning. 10/24/21   [provider]  Cholecalciferol (VITAMIN D-3) 125 MCG (5000 UT)  TABS Take 5,000 Units by mouth daily.    [provider]  flecainide (TAMBOCOR) 50 MG tablet Take 50 mg by mouth 2 (two) times daily.  09/04/19   [provider]  Menthol-Camphor (TIGER BALM ARTHRITIS RUB) 11-11 % CREA Apply 1 application  topically daily as needed (Knee pain).    [provider]  metFORMIN (GLUCOPHAGE) 500 MG tablet Take 500 mg by mouth daily with breakfast.    [provider]  metoprolol succinate (TOPROL-XL) 25 MG 24 hr tablet Take 1 tablet (25 mg total) by mouth daily. Take with or immediately following a meal. 07/19/22   Janina Mayo, MD  Omega-3 Fatty Acids (FISH OIL) 1200 MG CAPS Take 1,200 mg by mouth daily with breakfast.    [provider]  omeprazole (PRILOSEC) 40 MG capsule Take 40 mg by mouth daily. 08/17/20   [provider]  rivaroxaban (XARELTO) 20 MG TABS tablet Take 20 mg by mouth at bedtime.     [provider]  RYBELSUS 14 MG TABS Take 14 mg by mouth in the morning. 07/27/21   [provider]  sertraline (ZOLOFT) 50 MG tablet Take 50 mg by mouth in the morning. 01/01/21   [provider]    Family History Family History  Problem Relation Age  of Onset   Breast cancer Mother    Heart attack Father    Diabetes Father    Congestive Heart Failure Father    Breast cancer Maternal Grandmother     Social History Social History   Tobacco Use   Smoking status: Never   Smokeless tobacco: Never  Vaping Use   Vaping Use: Never used  Substance Use Topics   Alcohol use: Never   Drug use: Never     Allergies   Acetaminophen-codeine, Bee venom, Meloxicam, Tramadol, Codeine, Coconut (cocos nucifera), and Tomato   Review of Systems Review of Systems Per HPI  Physical Exam Triage Vital Signs ED Triage Vitals [11/07/22 1416]  Enc Vitals Group     BP 105/70     Pulse Rate 80     Resp 18     Temp 99.1 F (37.3 C)     Temp Source Oral     SpO2 98 %     Weight      Height      Head Circumference      Peak Flow      Pain Score 10     Pain Loc      Pain Edu?      Excl. in [place]?    No data found.  Updated Vital Signs BP 105/70 (BP Location: Left Arm)   Pulse 80   Temp 99.1 F (37.3 C) (Oral)   Resp 18   SpO2 98%   Visual Acuity Right Eye Distance:   Left Eye Distance:   Bilateral Distance:    Right Eye Near:   Left Eye Near:    Bilateral Near:     Physical Exam Vitals and nursing note reviewed.  Constitutional:      Appearance: She is not ill-appearing or toxic-appearing.  HENT:     Head: Normocephalic and atraumatic.     Right Ear: Hearing and external ear normal.     Left Ear: Hearing and external ear normal.     Nose: Nose normal.     Mouth/Throat:     Lips: Pink.     Mouth: Mucous membranes are moist.     Pharynx: No posterior  oropharyngeal erythema.  Eyes:     General: Lids are normal. Vision grossly intact. Gaze aligned appropriately.     Extraocular Movements: Extraocular movements intact.     Conjunctiva/sclera: Conjunctivae normal.  Cardiovascular:     Rate and Rhythm: Normal rate and regular rhythm.     Heart sounds: Normal heart sounds, S1 normal and S2 normal.  Pulmonary:      Effort: Pulmonary effort is normal. No respiratory distress.     Breath sounds: Normal breath sounds and air entry.  Musculoskeletal:     Cervical back: Neck supple.  Skin:    General: Skin is warm and dry.     Capillary Refill: Capillary refill takes less than 2 seconds.     Findings: No rash.          Comments: +2 anterior tibialis pulses bilaterally.  No color change to the skin to bilateral legs.  No temperature change to the bilateral lower extremities.  Neurological:     General: No focal deficit present.     Mental Status: She is alert and oriented to person, place, and time. Mental status is at baseline.     Cranial Nerves: No dysarthria or facial asymmetry.  Psychiatric:        Mood and Affect: Mood normal.        Speech: Speech normal.        Behavior: Behavior normal.        Thought Content: Thought content normal.        Judgment: Judgment normal.      UC Treatments / Results  Labs (all labs ordered are listed, but only abnormal results are displayed) Labs Reviewed - No data to display  EKG   Radiology No results found.  Procedures Procedures (including critical care time)  Medications Ordered in UC Medications - No data to display  Initial Impression / Assessment and Plan / UC Course  I have reviewed the triage vital signs and the nursing notes.  Pertinent labs & imaging results that were available during my care of the patient were reviewed by me and considered in my medical decision making (see chart for details).   1.  Postoperative hematoma involving circulatory system following circulatory system procedure Hematoma has grown in size over the last couple of days and patient is on Xarelto for anticoagulant.  She is currently neurovascularly intact to the right lower extremity, however there is concern for possible worsening hematoma which could cause neurovascular compromise to the right lower extremity.  I recommend patient go to the nearest emergency  department for further evaluation and likely ultrasound of the right groin to ensure the hematoma is stable.  We are unable to provide the appropriate imaging at urgent care.  Patient requesting ambulance transport to the hospital as she does not have a vehicle and she experiences significant pain with walking.  [facility] called to transport patient to the hospital.  Discussed risks of deferring ED visit with patient, she expresses understanding and agreement with plan.  Patient discharged from urgent care with [facility] in stable condition.  Final Clinical Impressions(s) / UC Diagnoses   Final diagnoses:  Postoperative hematoma involving circulatory system following circulatory system procedure   Discharge Instructions   None    ED Prescriptions   None    PDMP not reviewed this encounter.   Talbot Grumbling, [place] 11/07/22 [phone]

## 2022-11-07 NOTE — ED Notes (Signed)
Pt return from CT scanner, NAD noted

## 2022-11-07 NOTE — ED Triage Notes (Addendum)
Pt reports had an ablation on 2/16 and reports pain and swelling at femoral site. Unable to get in to provider who did procedure and was instructed to come to UC.

## 2022-11-07 NOTE — ED Provider Notes (Signed)
[place] Provider Note   CSN: [id number] Arrival date & time: 11/07/22  1551     History  Chief Complaint  Patient presents with   Groin Swelling    Hematoma R    Meghan [name] is a 57 y.o. female.  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  The patient states that she was fine post procedure and continued her home Xarelto.  On Sunday she noticed a hematoma in her right groin began to develop.  It has progressively expanded ever since.  She called her EP office and was told to present to an urgent care for further evaluation.  She was then transferred to the emergency department for further evaluation from urgent care.  She states that she endorses pain in her right groin, worse with walking with a pressure sensation in her groin.  She has not missed any doses of her Xarelto.  Home Medications Prior to Admission medications   Medication Sig Start Date End Date Taking? Authorizing Provider  acetaminophen (TYLENOL) 500 MG tablet Take 1,000 mg by mouth every 6 (six) hours as needed for moderate pain.    [provider]  albuterol (VENTOLIN HFA) 108 (90 Base) MCG/ACT inhaler Inhale 2 puffs into the lungs every 6 (six) hours as needed for wheezing or shortness of breath.    [provider]  atorvastatin (LIPITOR) 40 MG tablet Take 40 mg by mouth every evening. 07/26/21   [provider]  BREZTRI AEROSPHERE 160-9-4.8 MCG/ACT AERO Inhale 2 puffs into the lungs in the morning and at bedtime.  03/24/20   [provider]  bumetanide (BUMEX) 1 MG tablet Take 1 mg by mouth in the morning. 10/24/21   [provider]  Cholecalciferol (VITAMIN D-3) 125 MCG (5000 UT) TABS  Take 5,000 Units by mouth daily.    [provider]  flecainide (TAMBOCOR) 50 MG tablet Take 50 mg by mouth 2 (two) times daily.  09/04/19   [provider]  Menthol-Camphor (TIGER BALM ARTHRITIS RUB) 11-11 % CREA Apply 1 application  topically daily as needed (Knee pain).    [provider]  metFORMIN (GLUCOPHAGE) 500 MG tablet Take 500 mg by mouth daily with breakfast.    [provider]  metoprolol succinate (TOPROL-XL) 25 MG 24 hr tablet Take 1 tablet (25 mg total) by mouth daily. Take with or immediately following a meal. 07/19/22   Janina Mayo, MD  Omega-3 Fatty Acids (FISH OIL) 1200 MG CAPS Take 1,200 mg by mouth daily with breakfast.    [provider]  omeprazole (PRILOSEC) 40 MG capsule Take 40 mg by mouth daily. 08/17/20   [provider]  rivaroxaban (XARELTO) 20 MG TABS tablet Take 20 mg by mouth at bedtime.     [provider]  RYBELSUS 14 MG TABS Take 14 mg by mouth in the morning. 07/27/21   [provider]  sertraline (ZOLOFT) 50 MG tablet Take 50 mg by mouth in the morning. 01/01/21   [provider]      Allergies    Acetaminophen-codeine, Bee venom, Meloxicam, Tramadol, Codeine, Coconut (cocos nucifera), and Tomato    Review of Systems   Review of Systems  All other systems reviewed and are negative.  Physical Exam Updated Vital Signs BP 133/69   Pulse 66   Temp 98.1 F (36.7 C) (Oral)   Resp (!) 21   SpO2 98%  Physical Exam Vitals and nursing note reviewed.  Constitutional:      General: She is not in acute distress.    Appearance: She is obese.  HENT:     Head: Normocephalic and atraumatic.  Eyes:     Conjunctiva/sclera: Conjunctivae normal.     Pupils: Pupils are equal, round, and reactive to light.  Cardiovascular:     Rate and Rhythm: Normal rate and regular rhythm.  Pulmonary:     Effort: Pulmonary effort is normal. No respiratory distress.  Abdominal:     General:  There is no distension.     Tenderness: There is no guarding.  Musculoskeletal:        General: No deformity or signs of injury.     Cervical back: Neck supple.     Comments:  Right groin femoral hematoma, tender to palpation and warm. 2+ DP pulses bilaterally, neurologically intact.   Skin:    Findings: No lesion or rash.  Neurological:     General: No focal deficit present.     Mental Status: She is alert. Mental status is at baseline.     ED Results / Procedures / Treatments   Labs (all labs ordered are listed, but only abnormal results are displayed) Labs Reviewed  CBC WITH DIFFERENTIAL/PLATELET - Abnormal; Notable for the following components:      Result Value   RBC 3.85 (*)    Hemoglobin 10.4 (*)    HCT 32.9 (*)    All other components within normal limits  BASIC METABOLIC PANEL  PROTIME-INR    EKG None  Radiology CT ABDOMEN PELVIS W CONTRAST  Result Date: 11/07/2022 CLINICAL DATA:  Abdominal pain, post-op. Recent cardiovascular atrial ablation procedure. EXAM: CT ABDOMEN AND PELVIS WITH CONTRAST TECHNIQUE: Multidetector CT imaging of the abdomen and pelvis was performed using the standard protocol following bolus administration of intravenous contrast. RADIATION DOSE REDUCTION: This exam was performed according to the departmental dose-optimization program which includes automated exposure control, adjustment of the mA and/or kV according to patient size and/or use of iterative reconstruction technique. CONTRAST:  46m OMNIPAQUE IOHEXOL 350 MG/ML SOLN COMPARISON:  CT abdomen pelvis 11/27/2021, CT pelvis 08/08/2021 FINDINGS: Lower chest: Bilateral lower lobe subsegmental atelectasis. No acute abnormality. Hepatobiliary: No focal liver abnormality. Status post cholecystectomy. No biliary dilatation. Pancreas: No focal lesion. Normal pancreatic contour. No surrounding inflammatory changes. No main pancreatic ductal dilatation. Spleen: The spleen is normal in size. Persistent  couple pericentimeter hypodensities indeterminate in etiology (3:20). Persistent hilar 1.1 cm hypodensity unclear in etiology. Adrenals/Urinary Tract: No adrenal nodule bilaterally. Bilateral kidneys enhance symmetrically. Kidney too small to characterize (6:75) -no further follow-up indicated. No hydronephrosis. No hydroureter. The urinary bladder is unremarkable. Subcentimeter hypodensity within the right Stomach/Bowel: Stomach is within normal limits. No evidence of bowel wall thickening or dilatation. The appendix is not definitely identified with no inflammatory changes in the right lower quadrant to suggest acute appendicitis. Vascular/Lymphatic: Stable location of a inferior vena cava filter possibly slightly caudally migrated from its original position with tip 3.5 cm away from the renal confluences with the inferior-most prong just proximal to the level of bifurcation. No abdominal aorta or iliac aneurysm. Mild atherosclerotic plaque of the aorta and its branches. No abdominal, pelvic, or inguinal lymphadenopathy. Reproductive: Status post hysterectomy. No adnexal masses. Other: No intraperitoneal free fluid.  No intraperitoneal free gas. No organized fluid collection. Musculoskeletal: Right inguinal subcutaneus soft tissue edema along the right femoral vein (3:79). Slight irregularity of the vein with underlying injury or thrombosis not excluded. No suspicious lytic or blastic osseous lesions. No acute displaced fracture. Multilevel degenerative changes of the spine. IMPRESSION: 1. Right inguinal subcutaneus soft tissue edema along the right femoral vein. Slight irregularity of the vein with underlying injury or thrombosis not excluded. No definite pseudoaneurysm identified. Finding could represent postsurgical/access changes. Consider further evaluation with ultrasound. 2. No acute intra-abdominal or intrapelvic abnormality. Electronically Signed   By: Iven Finn M.D.   On: 11/07/2022 20:30   VAS [name]  GROIN PSEUDOANEURYSM  Result Date: 11/07/2022  ARTERIAL PSEUDOANEURYSM  Patient Name:  Meghan [name]  Date of Exam:   11/07/2022 Medical Rec #: [id number]     Accession #:    [id number] Date of Birth: 12-06-1965    Patient Gender: F Patient Age:   44 years Exam Location:  [facility] Procedure:      VAS [name] GROIN PSEUDOANEURYSM Referring Phys: Tommye Standard --------------------------------------------------------------------------------  Exam: Right groin Indications: Patient complains of groin pain and palpable knot. History: 11/02/2022 - A-FLUTTER ABLATION. Limitations: patient body habitus, poor ultrasound/tissue interface Comparison Study: No prior studies. Performing Technologist: Oliver Hum RVT  Examination Guidelines: A complete evaluation includes B-mode imaging, spectral Doppler, color Doppler, and power Doppler as needed of all accessible portions of each vessel. Bilateral testing is considered an integral part of a complete examination. Limited examinations for reoccurring indications may be performed as noted. +------------+----------+-----------+------+----------+ Right DuplexPSV (cm/s) Waveform  PlaqueComment(s) +------------+----------+-----------+------+----------+ CFA                   multiphasic                 +------------+----------+-----------+------+----------+ Prox SFA              multiphasic                 +------------+----------+-----------+------+----------+  Findings: There appears to be a channel originating from the common femoral artery. The channel appears to course laterally in the groin, but does not appear to lead to a pseudoaneurysm. Negative for obvious evidence of AVF or pseudoaneurysm.    --------------------------------------------------------------------------------    Preliminary     Procedures Procedures    Medications Ordered in ED Medications  fentaNYL (SUBLIMAZE) injection 50 mcg (50 mcg Intravenous Given 11/07/22 1758)  iohexol  (OMNIPAQUE) 350 MG/ML injection 75 mL (75 mLs Intravenous Contrast Given 11/07/22 2011)    ED Course/ Medical Decision Making/ A&P Clinical Course as of 11/07/22 2045  Wed Nov 07, 2022  1658 Hold Xarelto until Friday night if imaging is reassuring per cardiology [JL]    Clinical Course User Index [JL] Regan Lemming, MD                             Medical Decision Making Amount and/or Complexity of Data Reviewed Labs: ordered.  Risk Prescription drug management. Decision regarding hospitalization.    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  The patient states that she was fine post procedure and continued her home Xarelto.  On Sunday she noticed a hematoma in her right groin began  to develop.  It has progressively expanded ever since.  She called her EP office and was told to present to an urgent care for further evaluation.  She was then transferred to the emergency department for further evaluation from urgent care.  She states that she endorses pain in her right groin, worse with walking with a pressure sensation in her groin.  She has not missed any doses of her Xarelto.  On arrival, the patient was vitally stable, sinus rhythm noted on cardiac telemetry.  Physical exam significant for right groin hematoma, distal extremity neurovascular intact.  The patient is presenting with an expanding hematoma in the setting of recent vascular access in the right groin for ambulation and anticoagulant use.  Cardiology master consulted to touch base with electrophysiology for further recommendations.  Dr. Curt Bears came by to evaluate the patient, recommended ultrasound of the groin to evaluate for pseudoaneurysm.  He also recommended CT scan as well.  If  reassuring and no active bleeding, patient could be discharged with plan for holding Xarelto for the next 2 nights.  [name] results: Findings:    There appears to be a channel originating from the common femoral artery. The channel appears to course laterally in the groin, but does not appear to lead to a pseudoaneurysm.  Negative for obvious evidence of AVF or pseudoaneurysm.   CT Abdomen Pelvis: IMPRESSION:  1. Right inguinal subcutaneus soft tissue edema along the right  femoral vein. Slight irregularity of the vein with underlying injury  or thrombosis not excluded. No definite pseudoaneurysm identified.  Finding could represent postsurgical/access changes. Consider  further evaluation with ultrasound.  2. No acute intra-abdominal or intrapelvic abnormality.   After discussion with Dr. Curt Bears, plan will be admission for observation in the setting of her groin hematoma.  Hospitalist medicine consulted for admission.  Final Clinical Impression(s) / ED Diagnoses Final diagnoses:  Hematoma of groin, initial encounter    Rx / DC Orders ED Discharge Orders     None         Regan Lemming, MD 11/07/22 2045

## 2022-11-07 NOTE — Progress Notes (Signed)
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 Preliminary results can be found in CV Proc through chart review.  Results were given to Dr. Armandina Gemma.  11/07/22 5:37 PM Meghan [name] RVT

## 2022-11-07 NOTE — H&P (Signed)
Cardiology Admission History and Physical   Patient ID: Meghan [name] MRN: [id number]; DOB: [birth year]   Admission date: 11/07/2022  PCP:  Beverley Fiedler, [place] Providers Cardiologist:  Janina Mayo, MD  Electrophysiologist:  Melida Quitter, MD       Chief Complaint:  groin pain  Patient Profile:   Meghan [name] is a 57 y.o. 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  History of Present Illness:   Meghan [name] had atrial flutter ablation on November 02, 2022.  As detailed in an earlier consultation note, no immediate complications where noted and patient was following postprocedural recommendations as directed. Now presented to the emergency department with worsening groin tenderness without back pain. Vascular ultrasound of the lower extremity showed no presence of pseudoaneurysm although a "channel originated from the common femoral artery" was described in the report.  CT abdominal pelvis showed "irregularity of the vein with underlying injury or thrombosis not excluded".  No pseudoaneurysm identified on CT.   Past Medical History:  Diagnosis Date   Anticoagulated    xarelto--- managed by cardiology   Anxiety    Arthritis    left knee   Asthma    Atrial fibrillation ([place]) 02/16/2019   CHF (congestive heart failure) ([place]) 01/2019   Physician from [place] informed her she had this    Diabetes mellitus without complication ([place])    Dysrhythmia    Atrial fibrillation   GERD (gastroesophageal reflux disease)    Hiatal hernia    History of pulmonary embolus (PE)    Hypercholesterolemia    Intractable nausea and vomiting 11/16/2020   Macular degeneration    of left eye   MDD (major depressive disorder)    Osteoarthritis    knees   Pneumonia     in the past   PTSD (post-traumatic stress disorder)    Pulmonary embolism ([place])    bilateral, none since 2003   Seizure disorder ([place])    Per patient last seizure was in 2020.   Sleep apnea CPAP   [facility]  Uses CPAP   Stroke ([place])    1989 and 1995 (left sided weakness)   Vitamin D deficiency     Past Surgical History:  Procedure Laterality Date   A-FLUTTER ABLATION N/A 11/02/2022   Procedure: A-FLUTTER ABLATION;  Surgeon: Mealor, Yetta Barre, MD;  Location: [place] [facility];  Service: Cardiovascular;  Laterality: N/A;   ABDOMINAL HYSTERECTOMY  2003   complete   CARDIAC CATHETERIZATION  2017   in [name] in [place] N/A 08/22/2022   Procedure: CARDIOVERSION;  Surgeon: Berniece Salines, DO;  Location: [place];  Service: Cardiovascular;  Laterality: N/A;   CESAREAN SECTION  [id number]   x 2   CHOLECYSTECTOMY  2003   COLONOSCOPY WITH PROPOFOL N/A 06/05/2019   Procedure: COLONOSCOPY WITH PROPOFOL;  Surgeon: Carol Ada, MD;  Location: [facility];  Service: Endoscopy;  Laterality: N/A;   CYST REMOVAL WITH BONE GRAFT Left 10/18/2020   Procedure: BONE GRAFTING OF ENCHONDROMA MIDDLE [place] OF LEFT MIDDLE FINGER;  Surgeon: Daryll Brod, MD;  Location: [place];  Service: Orthopedics;  Laterality: Left;  AXILLARY BLOCK   DIAGNOSTIC LAPAROSCOPY  2015; 2017   lap hernia repair x2   endoscopy  ENDOVENOUS ABLATION SAPHENOUS VEIN W/ LASER Left 08/31/2021   endovenous laser ablation left greater saphenous vein and stab phlebectomy 10-20 incisions left leg by Servando Snare MD   HALLUX FUSION Right 05/28/2022   Procedure: HALLUX FUSION METATARSAL PHALANGEAL JOIT;  Surgeon: [name], DPM;  Location: [place];  Service: Podiatry;  Laterality: Right;  BLOCK   HERNIA REPAIR  [id number], [id number]   umbilical hernia repair   IVC FILTER INSERTION  2003   Hx pulmonary embolus   POLYPECTOMY  06/05/2019   Procedure: POLYPECTOMY;  Surgeon: Carol Ada,  MD;  Location: [facility];  Service: Endoscopy;;   SHOULDER ARTHROSCOPY W/ ROTATOR CUFF REPAIR  08/28/2017   right shoulder   WRIST ARTHROSCOPY WITH DEBRIDEMENT Left 10/18/2020   Procedure: LEFT WRIST ARTHROSCOPY WITH DEBRIDEMENT;  Surgeon: Daryll Brod, MD;  Location: [place];  Service: Orthopedics;  Laterality: Left;  AXILLARY BLOCK   WRIST ARTHROSCOPY WITH DEBRIDEMENT Right 01/25/2022   Procedure: ARTHROSCOPY RIGHT WRIST WITH  DEBRIDEMENT/ SHRINKAGE;  Surgeon: Leanora Cover, MD;  Location: [place];  Service: Orthopedics;  Laterality: Right;     Medications Prior to Admission: Prior to Admission medications   Medication Sig Start Date End Date Taking? Authorizing Provider  acetaminophen (TYLENOL) 500 MG tablet Take 1,000 mg by mouth every 6 (six) hours as needed for moderate pain.   Yes [provider]  albuterol (VENTOLIN HFA) 108 (90 Base) MCG/ACT inhaler Inhale 2 puffs into the lungs every 6 (six) hours as needed for wheezing or shortness of breath.   Yes [provider]  atorvastatin (LIPITOR) 40 MG tablet Take 40 mg by mouth every evening. 07/26/21  Yes [provider]  BREZTRI AEROSPHERE 160-9-4.8 MCG/ACT AERO Inhale 2 puffs into the lungs in the morning and at bedtime.  03/24/20  Yes [provider]  bumetanide (BUMEX) 1 MG tablet Take 1 mg by mouth in the morning. 10/24/21  Yes [provider]  Cholecalciferol (VITAMIN D-3) 125 MCG (5000 UT) TABS Take 5,000 Units by mouth daily.   Yes [provider]  flecainide (TAMBOCOR) 50 MG tablet Take 50 mg by mouth 2 (two) times daily.  09/04/19  Yes [provider]  Menthol-Camphor (TIGER BALM ARTHRITIS RUB) 11-11 % CREA Apply 1 application  topically daily as needed (Knee pain).   Yes [provider]  metFORMIN (GLUCOPHAGE) 500 MG tablet Take 500 mg by mouth daily with breakfast.   Yes [provider]  metoprolol succinate (TOPROL-XL) 25 MG 24 hr tablet Take 1 tablet (25 mg  total) by mouth daily. Take with or immediately following a meal. 07/19/22  Yes Branch, Royetta Crochet, MD  Omega-3 Fatty Acids (FISH OIL) 1200 MG CAPS Take 1,200 mg by mouth daily with breakfast.   Yes [provider]  omeprazole (PRILOSEC) 40 MG capsule Take 40 mg by mouth daily. 08/17/20  Yes [provider]  rivaroxaban (XARELTO) 20 MG TABS tablet Take 20 mg by mouth at bedtime.    Yes [provider]  RYBELSUS 14 MG TABS Take 14 mg by mouth in the morning. 07/27/21  Yes [provider]  sertraline (ZOLOFT) 50 MG tablet Take 50 mg by mouth in the morning. 01/01/21  Yes [provider]     Allergies:    Allergies  Allergen Reactions   Acetaminophen-Codeine Swelling, Rash and Other (See Comments)    Tylenol with Codeine, Tylenol #3,  (facial swelling, hives)   Bee Venom Anaphylaxis   Meloxicam Anaphylaxis and  Rash   Tramadol Hives and Other (See Comments)    Patient stated "I was trippin' and I do not want that ever again"   Codeine Swelling    hives   Coconut (Cocos Nucifera) Rash and Other (See Comments)    ANY coconut products    Tomato Rash    Fresh     Social History:   Social History   Socioeconomic History   Marital status: Divorced    Spouse name: Not on file   Number of children: 3   Years of education: Not on file   Highest education level: Not on file  Occupational History   Not on file  Tobacco Use   Smoking status: Never   Smokeless tobacco: Never  Vaping Use   Vaping Use: Never used  Substance and Sexual Activity   Alcohol use: Never   Drug use: Never   Sexual activity: Not Currently    Birth control/protection: Surgical    Comment: Hysterectomy  Other Topics Concern   Not on file  Social History Narrative   ** Merged History Encounter **       Social Determinants of Health   Financial Resource Strain: Not on file  Food Insecurity: Not on file  Transportation Needs: Not on file  Physical Activity: Not on file   Stress: Not on file  Social Connections: Not on file  Intimate Partner Violence: Not on file    Family History:   The patient's family history includes Breast cancer in her maternal grandmother and mother; Congestive Heart Failure in her father; Diabetes in her father; Heart attack in her father.    ROS:  Please see the history of present illness.  All other ROS reviewed and negative.     Physical Exam/Data:   Vitals:   11/07/22 2200 11/07/22 2215 11/07/22 2300 11/07/22 2345  BP: 128/72  127/63 (!) 93/52  Pulse: 63  64 66  Resp: 17  20 20  $ Temp:  98.2 F (36.8 C)  98.5 F (36.9 C)  TempSrc:  Oral  Oral  SpO2: 98%  99% 99%   No intake or output data in the 24 hours ending 11/07/22 2349    11/02/2022    8:44 AM 10/24/2022    2:19 PM 10/08/2022    4:29 PM  Last 3 Weights  Weight (lbs) 307 lb 313 lb 307 lb  Weight (kg) 139.254 kg 141.976 kg 139.254 kg     There is no height or weight on file to calculate BMI.  General:  Well nourished, well developed, in no acute distress HEENT: normal Neck: no JVD Vascular: No carotid bruits; Distal pulses 2+ bilaterally   Cardiac:  normal S1, S2; RRR; no murmur  Lungs:  clear to auscultation bilaterally, no wheezing, rhonchi or rales  Abd: soft, nontender, no hepatomegaly  Ext: no edema Musculoskeletal:  No deformities, BUE and BLE strength normal and equal Skin: warm and dry  Neuro:  CNs 2-12 intact, no focal abnormalities noted Psych:  Normal affect   Relevant CV Studies:  Laboratory Data:  High Sensitivity Troponin:  No results for input(s): "TROPONINIHS" in the last 720 hours.    Chemistry Recent Labs  Lab 11/07/22 1717  NA 138  K 3.5  CL 104  CO2 28  GLUCOSE 92  BUN 9  CREATININE 0.79  CALCIUM 9.7  GFRNONAA >60  ANIONGAP 6    No results for input(s): "PROT", "ALBUMIN", "AST", "ALT", "ALKPHOS", "BILITOT" in the last 168 hours. Lipids No  results for input(s): "CHOL", "TRIG", "HDL", "LABVLDL", "LDLCALC", "CHOLHDL" in  the last 168 hours. Hematology Recent Labs  Lab 11/07/22 1717  WBC 7.0  RBC 3.85*  HGB 10.4*  HCT 32.9*  MCV 85.5  MCH 27.0  MCHC 31.6  RDW 14.6  PLT 249   Thyroid No results for input(s): "TSH", "FREET4" in the last 168 hours. BNPNo results for input(s): "BNP", "PROBNP" in the last 168 hours.  DDimer No results for input(s): "DDIMER" in the last 168 hours.   Radiology/Studies:  VAS [name] GROIN PSEUDOANEURYSM  Result Date: 11/07/2022  ARTERIAL PSEUDOANEURYSM  Patient Name:  Meghan [name]  Date of Exam:   11/07/2022 Medical Rec #: [id number]     Accession #:    [id number] Date of Birth: 06-30-1966    Patient Gender: F Patient Age:   73 years Exam [facility]  [facility] Procedure:      VAS [name] GROIN PSEUDOANEURYSM Referring Phys: Tommye Standard --------------------------------------------------------------------------------  Exam: Right groin Indications: Patient complains of groin pain and palpable knot. History: 11/02/2022 - A-FLUTTER ABLATION. Limitations: patient body habitus, poor ultrasound/tissue interface Comparison Study: No prior studies. Performing Technologist: Oliver Hum RVT  Examination Guidelines: A complete evaluation includes B-mode imaging, spectral Doppler, color Doppler, and power Doppler as needed of all accessible portions of each vessel. Bilateral testing is considered an integral part of a complete examination. Limited examinations for reoccurring indications may be performed as noted. +------------+----------+-----------+------+----------+ Right DuplexPSV (cm/s) Waveform  PlaqueComment(s) +------------+----------+-----------+------+----------+ CFA                   multiphasic                 +------------+----------+-----------+------+----------+ Prox SFA              multiphasic                 +------------+----------+-----------+------+----------+  Findings: There appears to be a channel originating from the common femoral artery. The channel  appears to course laterally in the groin, but does not appear to lead to a pseudoaneurysm. Negative for obvious evidence of AVF or pseudoaneurysm.  Diagnosing physician: Jamelle Haring Electronically signed by Jamelle Haring on 11/07/2022 at 10:08:38 PM.   --------------------------------------------------------------------------------    Final    CT ABDOMEN PELVIS W CONTRAST  Result Date: 11/07/2022 CLINICAL DATA:  Abdominal pain, post-op. Recent cardiovascular atrial ablation procedure. EXAM: CT ABDOMEN AND PELVIS WITH CONTRAST TECHNIQUE: Multidetector CT imaging of the abdomen and pelvis was performed using the standard protocol following bolus administration of intravenous contrast. RADIATION DOSE REDUCTION: This exam was performed according to the departmental dose-optimization program which includes automated exposure control, adjustment of the mA and/or kV according to patient size and/or use of iterative reconstruction technique. CONTRAST:  27m OMNIPAQUE IOHEXOL 350 MG/ML SOLN COMPARISON:  CT abdomen pelvis 11/27/2021, CT pelvis 08/08/2021 FINDINGS: Lower chest: Bilateral lower lobe subsegmental atelectasis. No acute abnormality. Hepatobiliary: No focal liver abnormality. Status post cholecystectomy. No biliary dilatation. Pancreas: No focal lesion. Normal pancreatic contour. No surrounding inflammatory changes. No main pancreatic ductal dilatation. Spleen: The spleen is normal in size. Persistent couple pericentimeter hypodensities indeterminate in etiology (3:20). Persistent hilar 1.1 cm hypodensity unclear in etiology. Adrenals/Urinary Tract: No adrenal nodule bilaterally. Bilateral kidneys enhance symmetrically. Kidney too small to characterize (6:75) -no further follow-up indicated. No hydronephrosis. No hydroureter. The urinary bladder is unremarkable. Subcentimeter hypodensity within the right Stomach/Bowel: Stomach is within normal limits. No evidence of bowel wall thickening or dilatation. The  appendix is not definitely identified with no inflammatory changes in the right lower quadrant to suggest acute appendicitis. Vascular/Lymphatic: Stable location of a inferior vena cava filter possibly slightly caudally migrated from its original position with tip 3.5 cm away from the renal confluences with the inferior-most prong just proximal to the level of bifurcation. No abdominal aorta or iliac aneurysm. Mild atherosclerotic plaque of the aorta and its branches. No abdominal, pelvic, or inguinal lymphadenopathy. Reproductive: Status post hysterectomy. No adnexal masses. Other: No intraperitoneal free fluid. No intraperitoneal free gas. No organized fluid collection. Musculoskeletal: Right inguinal subcutaneus soft tissue edema along the right femoral vein (3:79). Slight irregularity of the vein with underlying injury or thrombosis not excluded. No suspicious lytic or blastic osseous lesions. No acute displaced fracture. Multilevel degenerative changes of the spine. IMPRESSION: 1. Right inguinal subcutaneus soft tissue edema along the right femoral vein. Slight irregularity of the vein with underlying injury or thrombosis not excluded. No definite pseudoaneurysm identified. Finding could represent postsurgical/access changes. Consider further evaluation with ultrasound. 2. No acute intra-abdominal or intrapelvic abnormality. Electronically Signed   By: Iven Finn M.D.   On: 11/07/2022 20:30     Assessment and Plan:   Mrs Carlile is a 57 year old woman with past medical history as detailed above who underwent atrial flutter ablation on February 16 and is now presenting with an access site hematoma.  Somehow inconclusive Doppler ultrasound as well as CT.  There is no definite pseudoaneurysm.  Evaluation of extravasation by CT is likely challenging given that this is not a CT angiography.  There is a mention of an irregularity of the vein not excluding a thrombus, though a thrombus was not seen on Doppler  ultrasound.  Will continue conservative management.  Tomorrow we could get an opinion from vascular surgery.  Risk Assessment/Risk Scores:           Severity of Illness: The appropriate patient status for this patient is OBSERVATION. Observation status is judged to be reasonable and necessary in order to provide the required intensity of service to ensure the patient's safety. The patient's presenting symptoms, physical exam findings, and initial radiographic and laboratory data in the context of their medical condition is felt to place them at decreased risk for further clinical deterioration. Furthermore, it is anticipated that the patient will be medically stable for discharge from the hospital within 2 midnights of admission.    For questions or updates, please contact [place] Please consult [email or web address] for contact info under     Signed, Ewing Schlein, MD  11/07/2022 11:49 PM

## 2022-11-07 NOTE — ED Notes (Signed)
Edmonia Lynch, RN received report

## 2022-11-07 NOTE — Consult Note (Addendum)
Cardiology Consultation   Patient ID: Meghan [name] MRN: [id number]; DOB: 1966/08/29  Admit date: 11/07/2022 Date of Consult: 11/07/2022  PCP:  Beverley Fiedler, [facility] Cardiologist:  Janina Mayo, MD  Electrophysiologist:  Melida Quitter, MD  {     Patient Profile:   Meghan [name] is a 57 y.o. female with a hx of Morbid obesity, HLD, DM, recurrent PE's w/IVC filter, stroke, HFrEF that has recovered, AFib/typical AFlutter who is being seen 11/07/2022 for the evaluation of R groin tenderness/hematoma at the request of Dr. Armandina Gemma.  History of Present Illness:   Meghan [name] underwent an EPS/ablation for her AFlutter with Dr. Myles Gip 11/02/22 with no immediate post. She reports doing very well, taking her medicines as instructed without interruption in her a/c. Followed post procedure activity restrictions Monday she started to notice some tenderness to her R groin, reached out to the office was given an appt for Friday, yesterday noted escalating discomfort noted swelling to the groin, and some low R back pain as well. Came to the ER for further evaluation.  No CP, SOB, syncope. She has not seen noted any bleeding   Past Medical History:  Diagnosis Date   Anticoagulated    xarelto--- managed by cardiology   Anxiety    Arthritis    left knee   Asthma    Atrial fibrillation ([place]) 02/16/2019   CHF (congestive heart failure) ([place]) 01/2019   Physician from [place] informed her she had this    Diabetes mellitus without complication ([place])    Dysrhythmia    Atrial fibrillation   GERD (gastroesophageal reflux disease)    Hiatal hernia    History of pulmonary embolus (PE)    Hypercholesterolemia    Intractable nausea and vomiting 11/16/2020   Macular degeneration    of left eye   MDD (major depressive disorder)    Osteoarthritis    knees   Pneumonia    in the past   PTSD (post-traumatic stress disorder)    Pulmonary embolism ([place])     bilateral, none since 2003   Seizure disorder ([place])    Per patient last seizure was in 2020.   Sleep apnea CPAP   [facility]  Uses CPAP   Stroke ([place])    1989 and 1995 (left sided weakness)   Vitamin D deficiency     Past Surgical History:  Procedure Laterality Date   A-FLUTTER ABLATION N/A 11/02/2022   Procedure: A-FLUTTER ABLATION;  Surgeon: Mealor, Yetta Barre, MD;  Location: [facility];  Service: Cardiovascular;  Laterality: N/A;   ABDOMINAL HYSTERECTOMY  2003   complete   CARDIAC CATHETERIZATION  2017   in [name] in [place] N/A 08/22/2022   Procedure: CARDIOVERSION;  Surgeon: Berniece Salines, DO;  Location: [facility];  Service: Cardiovascular;  Laterality: N/A;   CESAREAN SECTION  [id number]   x 2   CHOLECYSTECTOMY  2003   COLONOSCOPY WITH PROPOFOL N/A 06/05/2019   Procedure: COLONOSCOPY WITH PROPOFOL;  Surgeon: Carol Ada, MD;  Location: [facility];  Service: Endoscopy;  Laterality: N/A;   CYST REMOVAL WITH BONE GRAFT Left 10/18/2020   Procedure: BONE GRAFTING OF ENCHONDROMA MIDDLE [place] OF LEFT MIDDLE FINGER;  Surgeon: Daryll Brod, MD;  Location: [facility];  Service: Orthopedics;  Laterality: Left;  AXILLARY BLOCK   DIAGNOSTIC LAPAROSCOPY  2015; 2017   lap hernia repair x2   endoscopy     ENDOVENOUS ABLATION SAPHENOUS  VEIN W/ LASER Left 08/31/2021   endovenous laser ablation left greater saphenous vein and stab phlebectomy 10-20 incisions left leg by Servando Snare MD   HALLUX FUSION Right 05/28/2022   Procedure: HALLUX FUSION METATARSAL PHALANGEAL JOIT;  Surgeon: [name], DPM;  Location: [facility];  Service: Podiatry;  Laterality: Right;  BLOCK   HERNIA REPAIR  [id number], [id number]   umbilical hernia repair   IVC FILTER INSERTION  2003   Hx pulmonary embolus   POLYPECTOMY  06/05/2019   Procedure: POLYPECTOMY;  Surgeon: Carol Ada, MD;  Location: [facility];  Service: Endoscopy;;   SHOULDER ARTHROSCOPY W/ ROTATOR CUFF  REPAIR  08/28/2017   right shoulder   WRIST ARTHROSCOPY WITH DEBRIDEMENT Left 10/18/2020   Procedure: LEFT WRIST ARTHROSCOPY WITH DEBRIDEMENT;  Surgeon: Daryll Brod, MD;  Location: [facility];  Service: Orthopedics;  Laterality: Left;  AXILLARY BLOCK   WRIST ARTHROSCOPY WITH DEBRIDEMENT Right 01/25/2022   Procedure: ARTHROSCOPY RIGHT WRIST WITH  DEBRIDEMENT/ SHRINKAGE;  Surgeon: Leanora Cover, MD;  Location: [facility];  Service: Orthopedics;  Laterality: Right;     Home Medications:  Prior to Admission medications   Medication Sig Start Date End Date Taking? Authorizing Provider  acetaminophen (TYLENOL) 500 MG tablet Take 1,000 mg by mouth every 6 (six) hours as needed for moderate pain.    [provider]  albuterol (VENTOLIN HFA) 108 (90 Base) MCG/ACT inhaler Inhale 2 puffs into the lungs every 6 (six) hours as needed for wheezing or shortness of breath.    [provider]  atorvastatin (LIPITOR) 40 MG tablet Take 40 mg by mouth every evening. 07/26/21   [provider]  BREZTRI AEROSPHERE 160-9-4.8 MCG/ACT AERO Inhale 2 puffs into the lungs in the morning and at bedtime.  03/24/20   [provider]  bumetanide (BUMEX) 1 MG tablet Take 1 mg by mouth in the morning. 10/24/21   [provider]  Cholecalciferol (VITAMIN D-3) 125 MCG (5000 UT) TABS Take 5,000 Units by mouth daily.    [provider]  flecainide (TAMBOCOR) 50 MG tablet Take 50 mg by mouth 2 (two) times daily.  09/04/19   [provider]  Menthol-Camphor (TIGER BALM ARTHRITIS RUB) 11-11 % CREA Apply 1 application  topically daily as needed (Knee pain).    [provider]  metFORMIN (GLUCOPHAGE) 500 MG tablet Take 500 mg by mouth daily with breakfast.    [provider]  metoprolol succinate (TOPROL-XL) 25 MG 24 hr tablet Take 1 tablet (25 mg total) by mouth daily. Take with or immediately following a meal. 07/19/22   Janina Mayo, MD  Omega-3 Fatty Acids (FISH OIL)  1200 MG CAPS Take 1,200 mg by mouth daily with breakfast.    [provider]  omeprazole (PRILOSEC) 40 MG capsule Take 40 mg by mouth daily. 08/17/20   [provider]  rivaroxaban (XARELTO) 20 MG TABS tablet Take 20 mg by mouth at bedtime.     [provider]  RYBELSUS 14 MG TABS Take 14 mg by mouth in the morning. 07/27/21   [provider]  sertraline (ZOLOFT) 50 MG tablet Take 50 mg by mouth in the morning. 01/01/21   [provider]    Inpatient Medications: Scheduled Meds:  Continuous Infusions:  PRN Meds:   Allergies:    Allergies  Allergen Reactions   Acetaminophen-Codeine Swelling, Rash and Other (See Comments)    Tylenol with Codeine, Tylenol #3,  (facial swelling, hives)   Bee  Venom Anaphylaxis   Meloxicam Anaphylaxis and Rash   Tramadol Hives and Other (See Comments)    Patient stated "I was trippin' and I do not want that ever again"   Codeine Swelling    hives   Coconut (Cocos Nucifera) Rash and Other (See Comments)    ANY coconut products    Tomato Rash    Fresh     Social History:   Social History   Socioeconomic History   Marital status: Divorced    Spouse name: Not on file   Number of children: 3   Years of education: Not on file   Highest education level: Not on file  Occupational History   Not on file  Tobacco Use   Smoking status: Never   Smokeless tobacco: Never  Vaping Use   Vaping Use: Never used  Substance and Sexual Activity   Alcohol use: Never   Drug use: Never   Sexual activity: Not Currently    Birth control/protection: Surgical    Comment: Hysterectomy  Other Topics Concern   Not on file  Social History Narrative   ** Merged History Encounter **       Social Determinants of Health   Financial Resource Strain: Not on file  Food Insecurity: Not on file  Transportation Needs: Not on file  Physical Activity: Not on file  Stress: Not on file  Social Connections: Not on file   Intimate Partner Violence: Not on file    Family History:   Family History  Problem Relation Age of Onset   Breast cancer Mother    Heart attack Father    Diabetes Father    Congestive Heart Failure Father    Breast cancer Maternal Grandmother      ROS:  Please see the history of present illness.  All other ROS reviewed and negative.     Physical Exam/Data:   Vitals:   11/07/22 1602  BP: (!) 120/54  Pulse: 74  Resp: (!) 24  Temp: 98.2 F (36.8 C)  TempSrc: Oral  SpO2: 99%   No intake or output data in the 24 hours ending 11/07/22 1707    11/02/2022    8:44 AM 10/24/2022    2:19 PM 10/08/2022    4:29 PM  Last 3 Weights  Weight (lbs) 307 lb 313 lb 307 lb  Weight (kg) 139.254 kg 141.976 kg 139.254 kg     There is no height or weight on file to calculate BMI.  General:  Well nourished, well developed, in no acute distress HEENT: normal Neck: no JVD Vascular: No carotid bruits; Distal pulses 2+ bilaterally Cardiac:  RRR; no murmurs, gallops or rubs Lungs:  CTA b/l, no wheezing, rhonchi or rales  Abd: soft, nontender, obese  Ext: no edema, R groin has a fairly small area of hematoma/tenderness (marked for observation/surveillance) no bruits, good good pedal pulses,  Musculoskeletal:  No deformities, BUE and BLE strength normal and equal, she c/o back pain, somewhat worsened by movement and palpation Skin: warm and dry  Neuro:  no focal abnormalities noted Psych:  Normal affect   EKG:  The EKG was personally reviewed and demonstrates:  none Telemetry:  Telemetry was personally reviewed and demonstrates:  SR, 70's-80's  Relevant CV Studies:  11/02/2022: EPS/ablation  CONCLUSIONS:   1. Isthmus-dependent counter clockwise right atrial flutter.   2. Successful radiofrequency ablation of atrial flutter along the cavotricuspid isthmus with complete bidirectional isthmus block achieved.   3. No inducible arrhythmias following ablation.  4. No early apparent  complications.   Laboratory Data:  High Sensitivity Troponin:  No results for input(s): "TROPONINIHS" in the last 720 hours.   ChemistryNo results for input(s): "NA", "K", "CL", "CO2", "GLUCOSE", "BUN", "CREATININE", "CALCIUM", "MG", "GFRNONAA", "GFRAA", "ANIONGAP" in the last 168 hours.  No results for input(s): "PROT", "ALBUMIN", "AST", "ALT", "ALKPHOS", "BILITOT" in the last 168 hours. Lipids No results for input(s): "CHOL", "TRIG", "HDL", "LABVLDL", "LDLCALC", "CHOLHDL" in the last 168 hours.  HematologyNo results for input(s): "WBC", "RBC", "HGB", "HCT", "MCV", "MCH", "MCHC", "RDW", "PLT" in the last 168 hours. Thyroid No results for input(s): "TSH", "FREET4" in the last 168 hours.  BNPNo results for input(s): "BNP", "PROBNP" in the last 168 hours.  DDimer No results for input(s): "DDIMER" in the last 168 hours.   Radiology/Studies:  No results found.   Assessment and Plan:   Apparent fairly small R groin hematoma s/p EPS/ablation Groin tender Reports back pain low Right, new for her the last 2 days or so [place] get CT abdomen/pelvis w/contrast to r/o retroperitoneal bleeding Vascular US to evaluate for AV fistula/pseudoaneurysm.  Dr. Curt Bears has discussed with ER MD If these are negative she can discharge home to hold her Xarelto until Friday  If abnormal imaging to have night cardiology coverage admit.     Risk Assessment/Risk Scores:   For questions or updates, please contact [facility] Please consult [email or web address] for contact info under    Signed, Sabrine Kitzman, PA-C  11/07/2022 5:07 PM  I have seen and examined this patient with Tommye Standard.  Agree with above, note added to reflect my findings.  Patient has a history of morbid obesity, recurrent PEs, CVA, atrial flutter.  She is post ablation for atrial flutter on 11/02/2022.  Over the last 2 days, she has developed worsening right access site pain.  She presented to emergency room was found to have a  hematoma.  She endorses some lower back pain which is new for her.  GEN: Well nourished, well developed, in no acute distress  HEENT: normal  Neck: no JVD, carotid bruits, or masses Cardiac: RRR; no murmurs, rubs, or gallops,no edema  Respiratory:  clear to auscultation bilaterally, normal work of breathing GI: soft, nontender, nondistended, + BS MS: no deformity or atrophy  Skin: warm and dry Neuro:  Strength and sensation are intact Psych: euthymic mood, full affect   Groin site hematoma: Patient has a small hematoma to which was marked today.  She is having discomfort, but the hematoma appears small.  Addis Tuohy plan for ultrasound of the hematoma and CT scan as she is having back pain.  If these are unrevealing and there is no active bleeding, would be able to be discharged home.  Would hold Xarelto for the next 2 nights.  If this does show active bleeding, [name] Schoff need to be admitted to the hospital. Atrial flutter: Status post ablation as above.  Remains in sinus rhythm.  Brionne Mertz M. Ceirra Belli MD 11/07/2022 5:35 PM

## 2022-11-07 NOTE — ED Notes (Signed)
[facility] notified.

## 2022-11-07 NOTE — ED Notes (Addendum)
ED TO INPATIENT HANDOFF REPORT  ED Nurse Name and Phone #: [phone] [facility]  S Name/Age/Gender Meghan [name] 57 y.o. female Room/Bed: 019C/019C  Code Status   Code Status: Full Code  Home/SNF/Other Home Patient oriented to: self, place, time, and situation Is this baseline? Yes   Triage Complete: Triage complete  Chief Complaint Vascular complication [id number]  Triage Note R groin pain. Hard lump felt on right groin. Recent ablation 2/16. Pt reports increasing pain in the area. Pt able to walk   Allergies Allergies  Allergen Reactions   Acetaminophen-Codeine Swelling, Rash and Other (See Comments)    Tylenol with Codeine, Tylenol #3,  (facial swelling, hives)   Bee Venom Anaphylaxis   Meloxicam Anaphylaxis and Rash   Tramadol Hives and Other (See Comments)    Patient stated "I was trippin' and I do not want that ever again"   Codeine Swelling    hives   Coconut (Cocos Nucifera) Rash and Other (See Comments)    ANY coconut products    Tomato Rash    Fresh     Level of Care/Admitting Diagnosis ED Disposition     ED Disposition  Admit   Condition  --   [place]: [place] [100100]  Level of Care: Progressive [102]  Admit to Progressive based on following criteria: CARDIOVASCULAR & THORACIC of moderate stability with acute coronary syndrome symptoms/low risk myocardial infarction/hypertensive urgency/arrhythmias/heart failure potentially compromising stability and stable post cardiovascular intervention patients.  May place patient in observation at [facility] or [place] if equivalent level of care is available:: Yes  Covid Evaluation: Asymptomatic - no recent exposure (last 10 days) testing not required  Diagnosis: Vascular complication [id number]  Admitting Physician: Ewing Schlein [id number]  Attending Physician: Ewing Schlein [id number]          B Medical/Surgery History Past Medical History:   Diagnosis Date   Anticoagulated    xarelto--- managed by cardiology   Anxiety    Arthritis    left knee   Asthma    Atrial fibrillation ([place]) 02/16/2019   CHF (congestive heart failure) ([place]) 01/2019   Physician from [place] informed her she had this    Diabetes mellitus without complication ([place])    Dysrhythmia    Atrial fibrillation   GERD (gastroesophageal reflux disease)    Hiatal hernia    History of pulmonary embolus (PE)    Hypercholesterolemia    Intractable nausea and vomiting 11/16/2020   Macular degeneration    of left eye   MDD (major depressive disorder)    Osteoarthritis    knees   Pneumonia    in the past   PTSD (post-traumatic stress disorder)    Pulmonary embolism ([place])    bilateral, none since 2003   Seizure disorder ([place])    Per patient last seizure was in 2020.   Sleep apnea CPAP   [facility]  Uses CPAP   Stroke ([place])    1989 and 1995 (left sided weakness)   Vitamin D deficiency    Past Surgical History:  Procedure Laterality Date   A-FLUTTER ABLATION N/A 11/02/2022   Procedure: A-FLUTTER ABLATION;  Surgeon: Mealor, Yetta Barre, MD;  Location: [place] [facility];  Service: Cardiovascular;  Laterality: N/A;   ABDOMINAL HYSTERECTOMY  2003   complete   CARDIAC CATHETERIZATION  2017   in [facility] in [place] 08/22/2022   Procedure: CARDIOVERSION;  Surgeon: Berniece Salines, DO;  Location: [place];  Service: Cardiovascular;  Laterality: N/A;   CESAREAN SECTION  [id number]   x 2   CHOLECYSTECTOMY  2003   COLONOSCOPY WITH PROPOFOL N/A 06/05/2019   Procedure: COLONOSCOPY WITH PROPOFOL;  Surgeon: Carol Ada, MD;  Location: [facility];  Service: Endoscopy;  Laterality: N/A;   CYST REMOVAL WITH BONE GRAFT Left 10/18/2020   Procedure: BONE GRAFTING OF ENCHONDROMA MIDDLE [place] OF LEFT MIDDLE FINGER;  Surgeon: Daryll Brod, MD;  Location: [place];  Service: Orthopedics;  Laterality: Left;  AXILLARY BLOCK   DIAGNOSTIC LAPAROSCOPY   2015; 2017   lap hernia repair x2   endoscopy     ENDOVENOUS ABLATION SAPHENOUS VEIN W/ LASER Left 08/31/2021   endovenous laser ablation left greater saphenous vein and stab phlebectomy 10-20 incisions left leg by Servando Snare MD   [place] Right 05/28/2022   Procedure: HALLUX FUSION METATARSAL PHALANGEAL JOIT;  Surgeon: [name], DPM;  Location: [place];  Service: Podiatry;  Laterality: Right;  BLOCK   HERNIA REPAIR  [id number], [id number]   umbilical hernia repair   IVC FILTER INSERTION  2003   Hx pulmonary embolus   POLYPECTOMY  06/05/2019   Procedure: POLYPECTOMY;  Surgeon: Carol Ada, MD;  Location: [facility];  Service: Endoscopy;;   SHOULDER ARTHROSCOPY W/ ROTATOR CUFF REPAIR  08/28/2017   right shoulder   WRIST ARTHROSCOPY WITH DEBRIDEMENT Left 10/18/2020   Procedure: LEFT WRIST ARTHROSCOPY WITH DEBRIDEMENT;  Surgeon: Daryll Brod, MD;  Location: [place];  Service: Orthopedics;  Laterality: Left;  AXILLARY BLOCK   WRIST ARTHROSCOPY WITH DEBRIDEMENT Right 01/25/2022   Procedure: ARTHROSCOPY RIGHT WRIST WITH  DEBRIDEMENT/ SHRINKAGE;  Surgeon: Leanora Cover, MD;  Location: [place];  Service: Orthopedics;  Laterality: Right;     A IV Location/Drains/Wounds Patient Lines/Drains/Airways Status     Active Line/Drains/Airways     Name Placement date Placement time Site Days   Peripheral IV 11/07/22 20 G Left Antecubital 11/07/22  1709  Antecubital  less than 1            Intake/Output Last 24 hours No intake or output data in the 24 hours ending 11/07/22 2210  Labs/Imaging Results for orders placed or performed during the hospital encounter of 11/07/22 (from the past 48 hour(s))  CBC with Differential     Status: Abnormal   Collection Time: 11/07/22  5:17 PM  Result Value Ref Range   WBC 7.0 4.0 - 10.5 K/uL   RBC 3.85 (L) 3.87 - 5.11 MIL/uL   Hemoglobin 10.4 (L) 12.0 - 15.0 g/dL   HCT 32.9 (L) 36.0 - 46.0 %   MCV 85.5 80.0 - 100.0 fL   MCH 27.0 26.0 -  34.0 pg   MCHC 31.6 30.0 - 36.0 g/dL   RDW 14.6 11.5 - 15.5 %   Platelets 249 150 - 400 K/uL   nRBC 0.0 0.0 - 0.2 %   Neutrophils Relative % 66 %   Neutro Abs 4.6 1.7 - 7.7 K/uL   Lymphocytes Relative 23 %   Lymphs Abs 1.6 0.7 - 4.0 K/uL   Monocytes Relative 9 %   Monocytes Absolute 0.7 0.1 - 1.0 K/uL   Eosinophils Relative 2 %   Eosinophils Absolute 0.2 0.0 - 0.5 K/uL   Basophils Relative 0 %   Basophils Absolute 0.0 0.0 - 0.1 K/uL   Immature Granulocytes 0 %   Abs Immature Granulocytes 0.02 0.00 - 0.07 K/uL    Comment: Performed at [facility]  [facility], [address] [place], [place] [id number]  Basic metabolic panel     Status: None   Collection Time: 11/07/22  5:17 PM  Result Value Ref Range   Sodium 138 135 - 145 mmol/L   Potassium 3.5 3.5 - 5.1 mmol/L   Chloride 104 98 - 111 mmol/L   CO2 28 22 - 32 mmol/L   Glucose, Bld 92 70 - 99 mg/dL    Comment: Glucose reference range applies only to samples taken after fasting for at least 8 hours.   BUN 9 6 - 20 mg/dL   Creatinine, Ser 0.79 0.44 - 1.00 mg/dL   Calcium 9.7 8.9 - 10.3 mg/dL   GFR, Estimated >60 >60 mL/min    Comment: (NOTE) Calculated using the CKD-EPI Creatinine Equation (2021)    Anion gap 6 5 - 15    Comment: Performed at [facility] [address], [place], [place] [address]  Protime-INR     Status: None   Collection Time: 11/07/22  5:17 PM  Result Value Ref Range   Prothrombin Time 13.8 11.4 - 15.2 seconds   INR 1.1 0.8 - 1.2    Comment: (NOTE) INR goal varies based on device and disease states. Performed at [facility], [place] [address] [place], [place] [address]    VAS [name] GROIN [place]  Result Date: 11/07/2022  ARTERIAL PSEUDOANEURYSM  Patient Name:  HALA IVORY  Date of Exam:   11/07/2022 Medical Rec #: [id number]     Accession #:    [id number] Date of Birth: 16-Jan-1966    Patient Gender: F Patient Age:   69 years Exam Location:  [facility] Procedure:      VAS [name] GROIN  PSEUDOANEURYSM Referring Phys: Tommye Standard --------------------------------------------------------------------------------  Exam: Right groin Indications: Patient complains of groin pain and palpable knot. History: 11/02/2022 - A-FLUTTER ABLATION. Limitations: patient body habitus, poor ultrasound/tissue interface Comparison Study: No prior studies. Performing Technologist: Oliver Hum RVT  Examination Guidelines: A complete evaluation includes B-mode imaging, spectral Doppler, color Doppler, and power Doppler as needed of all accessible portions of each vessel. Bilateral testing is considered an integral part of a complete examination. Limited examinations for reoccurring indications may be performed as noted. +------------+----------+-----------+------+----------+ Right DuplexPSV (cm/s) Waveform  PlaqueComment(s) +------------+----------+-----------+------+----------+ CFA                   multiphasic                 +------------+----------+-----------+------+----------+ Prox SFA              multiphasic                 +------------+----------+-----------+------+----------+  Findings: There appears to be a channel originating from the common femoral artery. The channel appears to course laterally in the groin, but does not appear to lead to a pseudoaneurysm. Negative for obvious evidence of AVF or pseudoaneurysm.  Diagnosing physician: Jamelle Haring Electronically signed by Jamelle Haring on 11/07/2022 at 10:08:38 PM.   --------------------------------------------------------------------------------    Final    CT ABDOMEN PELVIS W CONTRAST  Result Date: 11/07/2022 CLINICAL DATA:  Abdominal pain, post-op. Recent cardiovascular atrial ablation procedure. EXAM: CT ABDOMEN AND PELVIS WITH CONTRAST TECHNIQUE: Multidetector CT imaging of the abdomen and pelvis was performed using the standard protocol following bolus administration of intravenous contrast. RADIATION DOSE REDUCTION: This exam  was performed according to the departmental dose-optimization program which includes automated exposure control, adjustment of the mA  and/or kV according to patient size and/or use of iterative reconstruction technique. CONTRAST:  47m OMNIPAQUE IOHEXOL 350 MG/ML SOLN COMPARISON:  CT abdomen pelvis 11/27/2021, CT pelvis 08/08/2021 FINDINGS: Lower chest: Bilateral lower lobe subsegmental atelectasis. No acute abnormality. Hepatobiliary: No focal liver abnormality. Status post cholecystectomy. No biliary dilatation. Pancreas: No focal lesion. Normal pancreatic contour. No surrounding inflammatory changes. No main pancreatic ductal dilatation. Spleen: The spleen is normal in size. Persistent couple pericentimeter hypodensities indeterminate in etiology (3:20). Persistent hilar 1.1 cm hypodensity unclear in etiology. Adrenals/Urinary Tract: No adrenal nodule bilaterally. Bilateral kidneys enhance symmetrically. Kidney too small to characterize (6:75) -no further follow-up indicated. No hydronephrosis. No hydroureter. The urinary bladder is unremarkable. Subcentimeter hypodensity within the right Stomach/Bowel: Stomach is within normal limits. No evidence of bowel wall thickening or dilatation. The appendix is not definitely identified with no inflammatory changes in the right lower quadrant to suggest acute appendicitis. Vascular/Lymphatic: Stable location of a inferior vena cava filter possibly slightly caudally migrated from its original position with tip 3.5 cm away from the renal confluences with the inferior-most prong just proximal to the level of bifurcation. No abdominal aorta or iliac aneurysm. Mild atherosclerotic plaque of the aorta and its branches. No abdominal, pelvic, or inguinal lymphadenopathy. Reproductive: Status post hysterectomy. No adnexal masses. Other: No intraperitoneal free fluid. No intraperitoneal free gas. No organized fluid collection. Musculoskeletal: Right inguinal subcutaneus soft tissue  edema along the right femoral vein (3:79). Slight irregularity of the vein with underlying injury or thrombosis not excluded. No suspicious lytic or blastic osseous lesions. No acute displaced fracture. Multilevel degenerative changes of the spine. IMPRESSION: 1. Right inguinal subcutaneus soft tissue edema along the right femoral vein. Slight irregularity of the vein with underlying injury or thrombosis not excluded. No definite pseudoaneurysm identified. Finding could represent postsurgical/access changes. Consider further evaluation with ultrasound. 2. No acute intra-abdominal or intrapelvic abnormality. Electronically Signed   By: MIven FinnM.D.   On: 11/07/2022 20:30    Pending Labs Unresulted Labs (From admission, onward)     Start     Ordered   11/08/22 0500  Lipoprotein A (LPA)  Tomorrow morning,   R        11/07/22 2109   11/08/22 0500  HIV Antibody (routine testing w rflx)  (HIV Antibody (Routine testing w reflex) panel)  Once,   R        11/07/22 2117            Vitals/Pain Today's Vitals   11/07/22 1912 11/07/22 1948 11/07/22 2000 11/07/22 2050  BP:   127/81   Pulse:   68   Resp:   17   Temp:  98.1 F (36.7 C)    TempSrc:  Oral    SpO2:   100%   PainSc: 4    10-Worst pain ever    Isolation Precautions No active isolations  Medications Medications  nitroGLYCERIN (NITROSTAT) SL tablet 0.4 mg (has no administration in time range)  ondansetron (ZOFRAN) injection 4 mg (has no administration in time range)  fentaNYL (SUBLIMAZE) injection 50 mcg (50 mcg Intravenous Given 11/07/22 1758)  iohexol (OMNIPAQUE) 350 MG/ML injection 75 mL (75 mLs Intravenous Contrast Given 11/07/22 2011)  HYDROmorphone (DILAUDID) injection 1 mg (1 mg Intravenous Given 11/07/22 2050)    Mobility walks      R Recommendations: See Admitting Provider Note  Report given to:   Additional Notes: Pt A&O x4, VSS, pt has a purwick, ambulatory at home, pain controlled with dilaudid.  Admit  for vascular complication to the right groin area, swelling and painful to touch. Pt has a lifelong anticoagulation IVC filter and on Xarelto, hx CHF.

## 2022-11-08 DIAGNOSIS — I999 Unspecified disorder of circulatory system: Secondary | ICD-10-CM | POA: Diagnosis not present

## 2022-11-08 DIAGNOSIS — E119 Type 2 diabetes mellitus without complications: Secondary | ICD-10-CM | POA: Diagnosis not present

## 2022-11-08 DIAGNOSIS — S301XXA Contusion of abdominal wall, initial encounter: Secondary | ICD-10-CM | POA: Diagnosis not present

## 2022-11-08 DIAGNOSIS — I4891 Unspecified atrial fibrillation: Secondary | ICD-10-CM | POA: Diagnosis not present

## 2022-11-08 DIAGNOSIS — Z8673 Personal history of transient ischemic attack (TIA), and cerebral infarction without residual deficits: Secondary | ICD-10-CM | POA: Diagnosis not present

## 2022-11-08 LAB — CBC
HCT: 31 % — ABNORMAL LOW (ref 36.0–46.0)
Hemoglobin: 10.2 g/dL — ABNORMAL LOW (ref 12.0–15.0)
MCH: 27.6 pg (ref 26.0–34.0)
MCHC: 32.9 g/dL (ref 30.0–36.0)
MCV: 84 fL (ref 80.0–100.0)
Platelets: 223 10*3/uL (ref 150–400)
RBC: 3.69 MIL/uL — ABNORMAL LOW (ref 3.87–5.11)
RDW: 14.5 % (ref 11.5–15.5)
WBC: 4.6 10*3/uL (ref 4.0–10.5)
nRBC: 0 % (ref 0.0–0.2)

## 2022-11-08 LAB — BASIC METABOLIC PANEL
Anion gap: 9 (ref 5–15)
BUN: 9 mg/dL (ref 6–20)
CO2: 24 mmol/L (ref 22–32)
Calcium: 9.6 mg/dL (ref 8.9–10.3)
Chloride: 106 mmol/L (ref 98–111)
Creatinine, Ser: 0.69 mg/dL (ref 0.44–1.00)
GFR, Estimated: >60 mL/min (ref >60)
Glucose, Bld: 92 mg/dL (ref 70–99)
Potassium: 3.7 mmol/L (ref 3.5–5.1)
Sodium: 139 mmol/L (ref 135–145)

## 2022-11-08 LAB — HEMOGLOBIN A1C
Hgb A1c MFr Bld: 5 % (ref 4.8–5.6)
Mean Plasma Glucose: 96.8 mg/dL

## 2022-11-08 LAB — GLUCOSE, CAPILLARY: Glucose-Capillary: 90 mg/dL (ref 70–99)

## 2022-11-08 LAB — HIV ANTIBODY (ROUTINE TESTING W REFLEX): HIV Screen 4th Generation wRfx: NONREACTIVE

## 2022-11-08 LAB — MAGNESIUM: Magnesium: 1.9 mg/dL (ref 1.7–2.4)

## 2022-11-08 MED ORDER — ACETAMINOPHEN 325 MG PO TABS
650.0000 mg | ORAL_TABLET | Freq: Four times a day (QID) | ORAL | Status: DC | PRN
  Administered 2022-11-08: 650 mg via ORAL
  Filled 2022-11-08: qty 2

## 2022-11-08 MED ORDER — INSULIN ASPART 100 UNIT/ML IJ SOLN: 0.0000 [IU] | Freq: Three times a day (TID) | INTRAMUSCULAR | Status: DC

## 2022-11-08 MED ORDER — BISACODYL 5 MG PO TBEC: 5.0000 mg | DELAYED_RELEASE_TABLET | Freq: Every day | ORAL | Status: DC | PRN

## 2022-11-08 NOTE — Progress Notes (Signed)
  Notified by RN that patient was having a headache and requesting something for this. She has an allergy to acetaminophen-codeine but patient told RN that she take Tylenol "all the time" at home for headaches. Therefore, will order PRN Acetaminophen for he headache.  Darreld Mclean, PA-C 11/08/2022 8:38 AM

## 2022-11-08 NOTE — Discharge Instructions (Addendum)
Please continue to follow your post procedure instructions. Avoid vigorous exercise, lifting until small area of hematoma/firmness is resolved  Continue your xarelto uninterrupted  You have an appointment in the AFib clinic next week for early follow up to recheck your groin and have an EKG done. You have an appointment set up with the [facility].  Multiple studies have shown that being followed by a dedicated atrial fibrillation clinic in addition to the standard care you receive from your other physicians improves health. We believe that enrollment in the atrial fibrillation clinic will allow [name] to better care for you.   The phone number to the [facility] is [phone]. The clinic is staffed Monday through Friday from 8:30am to 5pm.  Directions: The clinic is located in the [facility], [place] the hospital at the MAIN ENTRANCE "A", use [address] to the 6th floor.  Registration desk to the right of elevators on 6th floor  If you have any trouble locating the clinic, please don't hesitate to call [phone].

## 2022-11-08 NOTE — Discharge Summary (Signed)
DISCHARGE SUMMARY    Patient ID: Meghan [name],  MRN: [id number], DOB/AGE: [birth year] 57 y.o.  Admit date: 11/07/2022 Discharge date: 11/08/2022  Primary Care Physician: Beverley Fiedler, FNP  Primary Cardiologist: Dr. Harl Bowie Electrophysiologist: Dr. 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 female w/PMHx including above came w/post procedure groin pain, imaging with unclear findings and admitted for further evaluation   Hospital Course:  The patient was admitted and  vascular exam noting  Findings: There appears to be a channel originating from the common femoral artery. The channel appears to course laterally in the groin, but does not appear to lead to a pseudoaneurysm. Negative for obvious evidence of AVF or pseudoaneurysm  CT Right inguinal subcutaneus soft tissue edema along the right femoral vein. Slight irregularity of the vein with underlying injury or thrombosis not excluded. No definite pseudoaneurysm identified. Finding could represent postsurgical/access changes. Consider further evaluation with ultrasound. 2. No acute intra-abdominal or intrapelvic abnormality   She had no CP, palpitations, SOB She  initially had progressive R groin pain and swelling at home, as well as some back pain. Here all of these have improved, as did her exam.  She was given pain management though this AM with only Tylenol, exam and groin tenderness much improved, back pain completely resolved She continues to feel better No exam findings or symptoms to suggest infection  She was examined by Dr. Myles Gip and considered stable for discharge to home.   AFib follow up for next week in in place, will do EKG as well.   Physical Exam: Vitals:   11/08/22 0039 11/08/22 0303 11/08/22 0800 11/08/22 1105  BP:  116/68 114/62 (!) 119/57  Pulse: 66 66 65 66  Resp:  '15 15 14  '$ Temp: 98.5 F (36.9 C) 98.5 F (36.9 C) 98.6 F (37 C) 98.9 F (37.2 C)  TempSrc: Oral Axillary Oral Oral  SpO2:  98% 100% 97%    GEN- The patient is well appearing, alert and oriented x 3 today.   HEENT: normocephalic, atraumatic; sclera clear, conjunctiva pink; hearing intact; oropharynx clear; neck supple, no JVP Lungs-  CTA b/l, normal work of breathing.  No wheezes, rales, rhonchi Heart- RRR, no murmurs, rubs or gallops, PMI not laterally displaced GI- soft, non-tender, non-distended Extremities- no clubbing, cyanosis, or edema R groin with small hematoma, no erythema, minimal tenderness, no drainage, heat, no sign of infection MS- no significant deformity or atrophy Skin- warm and dry, no rash or lesion Psych- euthymic mood, full affect Neuro- no gross deficits   Labs:   Lab Results  Component Value Date   WBC 4.6  11/08/2022   HGB 10.2 (L) 11/08/2022   HCT 31.0 (L) 11/08/2022   MCV 84.0 11/08/2022   PLT 223 11/08/2022    Recent Labs  Lab 11/08/22 1134  NA 139  K 3.7  CL 106  CO2 24  BUN 9  CREATININE 0.69  CALCIUM 9.6  GLUCOSE 92    Discharge Medications:  Allergies as of 11/08/2022       Reactions   Acetaminophen-codeine Swelling, Rash, Other (See Comments)   Tylenol with Codeine, Tylenol #3,  (facial  swelling, hives)   Bee Venom Anaphylaxis   Meloxicam Anaphylaxis, Rash   Tramadol Hives, Other (See Comments)   Patient stated "I was trippin' and I do not want that ever again"   Codeine Swelling   hives   Coconut (cocos Nucifera) Rash, Other (See Comments)   ANY coconut products    Tomato Rash   Fresh         Medication List     TAKE these medications    acetaminophen 500 MG tablet Commonly known as: TYLENOL Take 1,000 mg by mouth every 6 (six) hours as needed for moderate pain.   albuterol 108 (90 Base) MCG/ACT inhaler Commonly known as: VENTOLIN HFA Inhale 2 puffs into the lungs every 6 (six) hours as needed for wheezing or shortness of breath.   atorvastatin 40 MG tablet Commonly known as: LIPITOR Take 40 mg by mouth every evening.   Breztri Aerosphere 160-9-4.8 MCG/ACT Aero Generic drug: Budeson-Glycopyrrol-Formoterol Inhale 2 puffs into the lungs in the morning and at bedtime.   bumetanide 1 MG tablet Commonly known as: BUMEX Take 1 mg by mouth in the morning.   Fish Oil 1200 MG Caps Take 1,200 mg by mouth daily with breakfast.   flecainide 50 MG tablet Commonly known as: TAMBOCOR Take 50 mg by mouth 2 (two) times daily.   metFORMIN 500 MG tablet Commonly known as: GLUCOPHAGE Take 500 mg by mouth daily with breakfast.   metoprolol succinate 25 MG 24 hr tablet Commonly known as: TOPROL-XL Take 1 tablet (25 mg total) by mouth daily. Take with or immediately following a meal.   omeprazole 40 MG capsule Commonly known as: PRILOSEC Take 40 mg by mouth daily.   rivaroxaban 20 MG Tabs tablet Commonly known as: XARELTO Take 20 mg by mouth at bedtime.   Rybelsus 14 MG Tabs Generic drug: Semaglutide Take 14 mg by mouth in the morning.   sertraline 50 MG tablet Commonly known as: ZOLOFT Take 50 mg by mouth in the morning.   Tiger Balm Arthritis Rub 11-11 % Crea Generic drug: Menthol-Camphor Apply 1 application  topically daily as needed (Knee  pain).   Vitamin D-3 125 MCG (5000 UT) Tabs Take 5,000 Units by mouth daily.        Disposition: Home Discharge Instructions     Diet - low sodium heart healthy   Complete by: As directed    Increase activity slowly   Complete by: As directed         Duration of Discharge Encounter: Greater than 30 minutes including physician time.  Doriane, Shreeves, PA-C 11/08/2022 2:45 PM

## 2022-11-08 NOTE — Progress Notes (Signed)
Rounding Note    Patient Name: Meghan [name] Date of Encounter: 11/08/2022  [place] Cardiologist: Janina Mayo, MD   Subjective   Feeling improved, no back pain, groin is less tender. She has not ambulated yet  Inpatient Medications    Scheduled Meds:  Continuous Infusions:  PRN Meds: acetaminophen, nitroGLYCERIN, ondansetron (ZOFRAN) IV   Vital Signs    Vitals:   11/07/22 2345 11/08/22 0039 11/08/22 0303 11/08/22 0800  BP: (!) 93/52  116/68 114/62  Pulse: 66 66 66 65  Resp: '20  15 15  '$ Temp: 98.5 F (36.9 C) 98.5 F (36.9 C) 98.5 F (36.9 C) 98.6 F (37 C)  TempSrc: Oral Oral Axillary Oral  SpO2: 99%  98% 100%   No intake or output data in the 24 hours ending 11/08/22 1018    11/02/2022    8:44 AM 10/24/2022    2:19 PM 10/08/2022    4:29 PM  Last 3 Weights  Weight (lbs) 307 lb 313 lb 307 lb  Weight (kg) 139.254 kg 141.976 kg 139.254 kg      Telemetry    SR  - Personally Reviewed  ECG    SR 69, 1st degree AVblock 241m  - Personally Reviewed  Physical Exam   GEN: No acute distress.   Neck: No JVD Cardiac: RRR, no murmurs, rubs, or gallops.  Respiratory: CTA b/l. GI: Soft, nontender, non-distended  MS: No edema; No deformity. Neuro:  Nonfocal  Psych: Normal affect   R groin: remains with small hematoma, smaller then last night, much less tender, + pedal pulses  Labs    High Sensitivity Troponin:  No results for input(s): "TROPONINIHS" in the last 720 hours.   Chemistry Recent Labs  Lab 11/07/22 1717  NA 138  K 3.5  CL 104  CO2 28  GLUCOSE 92  BUN 9  CREATININE 0.79  CALCIUM 9.7  GFRNONAA >60  ANIONGAP 6    Lipids No results for input(s): "CHOL", "TRIG", "HDL", "LABVLDL", "LDLCALC", "CHOLHDL" in the last 168 hours.  Hematology Recent Labs  Lab 11/07/22 1717  WBC 7.0  RBC 3.85*  HGB 10.4*  HCT 32.9*  MCV 85.5  MCH 27.0  MCHC 31.6  RDW 14.6  PLT 249   Thyroid No results for input(s): "TSH", "FREET4" in the  last 168 hours.  BNPNo results for input(s): "BNP", "PROBNP" in the last 168 hours.  DDimer No results for input(s): "DDIMER" in the last 168 hours.   Radiology    VAS [place]GROIN PSEUDOANEURYSM Result Date: 11/07/2022  ARTERIAL PSEUDOANEURYSM  Patient Name:  RFARAN [name] Date of Exam:   11/07/2022 Medical Rec #: [id number]    Accession #:    [id number] of Birth: [birth year]   Patient Gender: F Patient Age:   89years [facility] [place]OIN PSEUDOANEURYSM Referring Phys: RTommye Standard--------------------------------------------------------------------------------  Exam: Right groin Indications: Patient complains of groin pain and palpable knot. History: 11/02/2022 - A-FLUTTER ABLATION. Limitations: patient body habitus, poor ultrasound/tissue interface Comparison Study: No prior studies. Performing Technologist: GOliver HumRVT  Examination Guidelines: A complete evaluation includes B-mode imaging, spectral Doppler, color Doppler, and power Doppler as needed of all accessible portions of each vessel. Bilateral testing is considered an integral part of a complete examination. Limited examinations for reoccurring indications may be performed as noted. +------------+----------+-----------+------+----------+ Right DuplexPSV (cm/s) Waveform  PlaqueComment(s) +------------+----------+-----------+------+----------+ CFA  multiphasic                 +------------+----------+-----------+------+----------+ Prox SFA              multiphasic                 +------------+----------+-----------+------+----------+  Findings: There appears to be a channel originating from the common femoral artery. The channel appears to course laterally in the groin, but does not appear to lead to a pseudoaneurysm. Negative for obvious evidence of AVF or pseudoaneurysm.  Diagnosing physician: Jamelle Haring Electronically signed by Jamelle Haring on 11/07/2022 at  10:08:38 PM.   --------------------------------------------------------------------------------    Final    CT ABDOMEN PELVIS W CONTRAST Result Date: 11/07/2022 CLINICAL DATA:  Abdominal pain, post-op. Recent cardiovascular atrial ablation procedure. EXAM: CT ABDOMEN AND PELVIS WITH CONTRAST TECHNIQUE: Multidetector CT imaging of the abdomen and pelvis was performed using the standard protocol following bolus administration of intravenous contrast. RADIATION DOSE REDUCTION: This exam was performed according to the departmental dose-optimization program which includes automated exposure control, adjustment of the mA and/or kV according to patient size and/or use of iterative reconstruction technique. CONTRAST:  35m OMNIPAQUE IOHEXOL 350 MG/ML SOLN COMPARISON:  CT abdomen pelvis 11/27/2021, CT pelvis 08/08/2021 FINDINGS: Lower chest: Bilateral lower lobe subsegmental atelectasis. No acute abnormality. Hepatobiliary: No focal liver abnormality. Status post cholecystectomy. No biliary dilatation. Pancreas: No focal lesion. Normal pancreatic contour. No surrounding inflammatory changes. No main pancreatic ductal dilatation. Spleen: The spleen is normal in size. Persistent couple pericentimeter hypodensities indeterminate in etiology (3:20). Persistent hilar 1.1 cm hypodensity unclear in etiology. Adrenals/Urinary Tract: No adrenal nodule bilaterally. Bilateral kidneys enhance symmetrically. Kidney too small to characterize (6:75) -no further follow-up indicated. No hydronephrosis. No hydroureter. The urinary bladder is unremarkable. Subcentimeter hypodensity within the right Stomach/Bowel: Stomach is within normal limits. No evidence of bowel wall thickening or dilatation. The appendix is not definitely identified with no inflammatory changes in the right lower quadrant to suggest acute appendicitis. Vascular/Lymphatic: Stable location of a inferior vena cava filter possibly slightly caudally migrated from its  original position with tip 3.5 cm away from the renal confluences with the inferior-most prong just proximal to the level of bifurcation. No abdominal aorta or iliac aneurysm. Mild atherosclerotic plaque of the aorta and its branches. No abdominal, pelvic, or inguinal lymphadenopathy. Reproductive: Status post hysterectomy. No adnexal masses. Other: No intraperitoneal free fluid. No intraperitoneal free gas. No organized fluid collection. Musculoskeletal: Right inguinal subcutaneus soft tissue edema along the right femoral vein (3:79). Slight irregularity of the vein with underlying injury or thrombosis not excluded. No suspicious lytic or blastic osseous lesions. No acute displaced fracture. Multilevel degenerative changes of the spine. IMPRESSION: 1. Right inguinal subcutaneus soft tissue edema along the right femoral vein. Slight irregularity of the vein with underlying injury or thrombosis not excluded. No definite pseudoaneurysm identified. Finding could represent postsurgical/access changes. Consider further evaluation with ultrasound. 2. No acute intra-abdominal or intrapelvic abnormality. Electronically Signed   By: MIven FinnM.D.   On: 11/07/2022 20:30    Cardiac Studies   11/02/22: EPS/ablation CONCLUSIONS:   1. Isthmus-dependent counter clockwise right atrial flutter.   2. Successful radiofrequency ablation of atrial flutter along the cavotricuspid isthmus with complete bidirectional isthmus block achieved.   3. No inducible arrhythmias following ablation.   4. No early apparent complications.   Patient Profile     57y.o. female w/PMHx of Morbid obesity, HLD, DM, recurrent PE's w/IVC filter, stroke, HFrEF  that has recovered, AFib/typical AFlutter came w/post procedure groin pain, imaging with unclear findings and admitted for further evaluation  Assessment & Plan    R groin hematoma Stable, if not improved by my exam is smaller, also less discomfort Resolved back pain No symptoms  of illness Site without erythema, no heat, no drainage  She got some dilaudid last night,. But only tylenol since then, feeling much improved  I suspect perhaps she can go home Dr. Myles Gip will see her, review imaging for final recommendations  AFlutter S/p CTI ablation 11/02/22 paroxysmal AFib (not felt to be her clinical arrhythmia) CHA2DS2Vasc is 3, on Xarelto out patient Held here until R groin felt stable Maintaining SR   DM SSI for now  Hx of recurrent PE's Has IVC filter Resume [place] once cleared by Dr. Myles Gip  For questions or updates, please contact [place] Please consult [email or web address] for contact info under        Signed, Merlyn Wiker, PA-C  11/08/2022, 10:18 AM

## 2022-11-09 ENCOUNTER — Ambulatory Visit: Payer: 59 | Admitting: Student

## 2022-11-09 LAB — LIPOPROTEIN A (LPA): Lipoprotein (a): 237.6 nmol/L — ABNORMAL HIGH (ref <75.0)

## 2022-11-12 ENCOUNTER — Encounter (INDEPENDENT_AMBULATORY_CARE_PROVIDER_SITE_OTHER): Payer: 59 | Admitting: Family Medicine

## 2022-11-12 ENCOUNTER — Ambulatory Visit (HOSPITAL_COMMUNITY): Payer: 59 | Admitting: Licensed Clinical Social Worker

## 2022-11-14 ENCOUNTER — Ambulatory Visit (HOSPITAL_COMMUNITY)
Admission: RE | Admit: 2022-11-14 | Discharge: 2022-11-14 | Disposition: A | Discharge status: Home / Self Care | Payer: 59 | Source: Ambulatory Visit | Attending: Nurse Practitioner | Admitting: Nurse Practitioner

## 2022-11-14 VITALS — HR 67

## 2022-11-14 DIAGNOSIS — I4891 Unspecified atrial fibrillation: Secondary | ICD-10-CM

## 2022-11-14 DIAGNOSIS — I4892 Unspecified atrial flutter: Secondary | ICD-10-CM | POA: Insufficient documentation

## 2022-11-14 DIAGNOSIS — Z79899 Other long term (current) drug therapy: Secondary | ICD-10-CM | POA: Diagnosis not present

## 2022-11-14 DIAGNOSIS — R9431 Abnormal electrocardiogram [ECG] [EKG]: Secondary | ICD-10-CM | POA: Diagnosis not present

## 2022-11-14 NOTE — Progress Notes (Signed)
Pt in for a groin check, f/u from  ED visit 2/21 following atrial flutter ablation wit Dr. Myles Gip 2/16. Rt groin examined, no bruising  or tenderness. She is not having any further discomfort.Walking w/o issues.  She is in Sinus rhythm at 67 bpm. Has not noted any afib. Compliant with xarelto 20 mg daily.  67 BPM PR interval 234 ms QRS duration 90 ms QT/QTcB 392/414 ms P-R-T axes 65 70 76 Sinus rhythm with 1st degree A-V block Cannot rule out Anterior infarct , age undetermined Abnormal ECG When compared with ECG of 08-Nov-2022 07:02, PREVIOUS ECG IS PRESENT  F/u with Dr. Myles Gip 12/06/22.

## 2022-11-19 ENCOUNTER — Encounter (HOSPITAL_COMMUNITY): Payer: Self-pay

## 2022-11-19 ENCOUNTER — Ambulatory Visit (HOSPITAL_COMMUNITY): Payer: 59 | Admitting: Licensed Clinical Social Worker

## 2022-11-26 ENCOUNTER — Ambulatory Visit (INDEPENDENT_AMBULATORY_CARE_PROVIDER_SITE_OTHER): Payer: 59

## 2022-11-26 ENCOUNTER — Encounter (HOSPITAL_COMMUNITY): Payer: Self-pay

## 2022-11-26 ENCOUNTER — Ambulatory Visit (HOSPITAL_COMMUNITY): Payer: 59

## 2022-11-26 ENCOUNTER — Ambulatory Visit (HOSPITAL_COMMUNITY)
Admission: RE | Admit: 2022-11-26 | Discharge: 2022-11-26 | Disposition: A | Discharge status: Home / Self Care | Payer: 59 | Source: Ambulatory Visit | Attending: Family Medicine | Admitting: Family Medicine

## 2022-11-26 VITALS — BP 108/73 | HR 69 | Temp 98.2°F | Resp 16

## 2022-11-26 DIAGNOSIS — M25512 Pain in left shoulder: Secondary | ICD-10-CM

## 2022-11-26 DIAGNOSIS — M545 Low back pain, unspecified: Secondary | ICD-10-CM | POA: Diagnosis not present

## 2022-11-26 DIAGNOSIS — M79602 Pain in left arm: Secondary | ICD-10-CM | POA: Diagnosis not present

## 2022-11-26 DIAGNOSIS — M542 Cervicalgia: Secondary | ICD-10-CM

## 2022-11-26 DIAGNOSIS — R200 Anesthesia of skin: Secondary | ICD-10-CM

## 2022-11-26 DIAGNOSIS — R202 Paresthesia of skin: Secondary | ICD-10-CM

## 2022-11-26 DIAGNOSIS — M25561 Pain in right knee: Secondary | ICD-10-CM | POA: Diagnosis not present

## 2022-11-26 NOTE — ED Provider Notes (Signed)
[facility]    CSN: [id number] Arrival date & time: 11/26/22  1452      History   Chief Complaint Chief Complaint  Patient presents with   Shoulder Pain    I'm having some trouble with my left shoulder and my right knee and foot.  My knee gives out on me and I almost fell,  but was able to grab on to a chair. - Entered by patient    HPI Meghan [name] is a 57 y.o. female.   Patient is here for left shoulder/arm pain x several weeks.  Several weeks ago she had a pain that shot down her arm and her arm/hands/fingers went numb.  When she hold something in her hand (phone, remote) the pain shoots and then will drop it. The pain/numbness lasts for about 10-15 mins.  No neck pain.  This pain does happen at night when sleeping as well.   She is also having pain in her right knee for about a week or so.  She went to stand up and her knee just buckled.  This has been happening as well off/on.  Her right foot will be painful.  At times her whole right leg may go numb as well.  Some back pain.   She will see her pcp in several days.      `  Past Medical History:  Diagnosis Date   Anticoagulated    xarelto--- managed by cardiology   Anxiety    Arthritis    left knee   Asthma    Atrial fibrillation (HCC) 02/16/2019   CHF (congestive heart failure) (HCC) 01/2019   Physician from [place] informed her she had this    Diabetes mellitus without complication (HCC)    Dysrhythmia    Atrial fibrillation   GERD (gastroesophageal reflux disease)    Hiatal hernia    History of pulmonary embolus (PE)    Hypercholesterolemia    Intractable nausea and vomiting 11/16/2020   Macular degeneration    of left eye   MDD (major depressive disorder)    Osteoarthritis    knees   Pneumonia    in the past   PTSD (post-traumatic stress disorder)    Pulmonary embolism (HCC)    bilateral, none since 2003   Seizure disorder (HCC)    Per patient last seizure was in 2020.   Sleep apnea CPAP    [facility]  Uses CPAP   Stroke (HCC)    1989 and 1995 (left sided weakness)   Vitamin D deficiency     Patient Active Problem List   Diagnosis Date Noted   Vascular complication 11/07/2022   MDD (major depressive disorder), recurrent episode, moderate (HCC) 07/30/2022   GAD (generalized anxiety disorder) 07/30/2022   PTSD (post-traumatic stress disorder) 07/30/2022   Insomnia 05/24/2022   Arthritis of first metatarsophalangeal (MTP) joint of right foot 04/25/2022   Intractable nausea and vomiting 11/16/2020   Ileus (HCC) 11/14/2020   Fecal impaction (HCC) 11/14/2020   Left-sided weakness 09/29/2020   Asthma 09/29/2020   Pre-diabetes 09/29/2020   Pain in left finger(s) 07/12/2020   Seizure-like activity (HCC) 03/25/2020   History of head injury 03/25/2020   Obstructive sleep apnea syndrome 09/04/2019   Acute medial meniscus tear of right knee 07/10/2019   Class 3 severe obesity due to excess calories with serious comorbidity and body mass index (BMI) of 50.0 to 59.9 in adult [facility]) 06/01/2019   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   Atrial fibrillation (HCC) 04/17/2019  History of pulmonary embolus (PE) 04/17/2019    Past Surgical History:  Procedure Laterality Date   A-FLUTTER ABLATION N/A 11/02/2022   Procedure: A-FLUTTER ABLATION;  Surgeon: Mealor, Roberts Gaudy, MD;  [facility];  Service: Cardiovascular;  Laterality: N/A;   ABDOMINAL HYSTERECTOMY  2003   complete   CARDIAC CATHETERIZATION  2017   in [facility] in [place]   CARDIOVERSION N/A 08/22/2022   Procedure: CARDIOVERSION;  Surgeon: Thomasene Ripple, DO;  Location: [facility];  Service: Cardiovascular;  Laterality: N/A;   CESAREAN SECTION  [id number]   x 2   CHOLECYSTECTOMY  2003   COLONOSCOPY WITH PROPOFOL N/A 06/05/2019   Procedure: COLONOSCOPY WITH PROPOFOL;  Surgeon: Jeani Hawking, MD;  Location: [facility];  Service: Endoscopy;  Laterality: N/A;   CYST REMOVAL WITH BONE GRAFT Left  10/18/2020   Procedure: BONE GRAFTING OF ENCHONDROMA MIDDLE PHANLANX OF LEFT MIDDLE FINGER;  Surgeon: Cindee Salt, MD;  Location: [facility];  Service: Orthopedics;  Laterality: Left;  AXILLARY BLOCK   DIAGNOSTIC LAPAROSCOPY  2015; 2017   lap hernia repair x2   endoscopy     ENDOVENOUS ABLATION SAPHENOUS VEIN W/ LASER Left 08/31/2021   endovenous laser ablation left greater saphenous vein and stab phlebectomy 10-20 incisions left leg by Lemar Livings MD   HALLUX FUSION Right 05/28/2022   Procedure: HALLUX FUSION METATARSAL PHALANGEAL JOIT;  Surgeon: Candelaria Stagers, DPM;  Location: [facility];  Service: Podiatry;  Laterality: Right;  BLOCK   HERNIA REPAIR  2014, 2018   umbilical hernia repair   IVC FILTER INSERTION  2003   Hx pulmonary embolus   POLYPECTOMY  06/05/2019   Procedure: POLYPECTOMY;  Surgeon: Jeani Hawking, MD;  Location: [facility];  Service: Endoscopy;;   SHOULDER ARTHROSCOPY W/ ROTATOR CUFF REPAIR  08/28/2017   right shoulder   WRIST ARTHROSCOPY WITH DEBRIDEMENT Left 10/18/2020   Procedure: LEFT WRIST ARTHROSCOPY WITH DEBRIDEMENT;  Surgeon: Cindee Salt, MD;  [facility];  Service: Orthopedics;  Laterality: Left;  AXILLARY BLOCK   WRIST ARTHROSCOPY WITH DEBRIDEMENT Right 01/25/2022   Procedure: ARTHROSCOPY RIGHT WRIST WITH  DEBRIDEMENT/ SHRINKAGE;  Surgeon: Betha Loa, MD;  Location: [facility];  Service: Orthopedics;  Laterality: Right;    OB History   No obstetric history on file.      Home Medications    Prior to Admission medications   Medication Sig Start Date End Date Taking? Authorizing Provider  acetaminophen (TYLENOL) 500 MG tablet Take 1,000 mg by mouth every 6 (six) hours as needed for moderate pain.   Yes [provider]  albuterol (VENTOLIN HFA) 108 (90 Base) MCG/ACT inhaler Inhale 2 puffs into the lungs every 6 (six) hours as needed for wheezing or shortness of breath.   Yes [provider]  atorvastatin (LIPITOR) 40 MG tablet  Take 40 mg by mouth every evening. 07/26/21  Yes [provider]  BREZTRI AEROSPHERE 160-9-4.8 MCG/ACT AERO Inhale 2 puffs into the lungs in the morning and at bedtime.  03/24/20  Yes [provider]  bumetanide (BUMEX) 1 MG tablet Take 1 mg by mouth in the morning. 10/24/21  Yes [provider]  Cholecalciferol (VITAMIN D-3) 125 MCG (5000 UT) TABS Take 5,000 Units by mouth daily.   Yes [provider]  flecainide (TAMBOCOR) 50 MG tablet Take 50 mg by mouth 2 (two) times daily.  09/04/19  Yes [provider]  Menthol-Camphor (TIGER BALM ARTHRITIS RUB) 11-11 % CREA Apply 1 application  topically daily as needed (Knee pain).   Yes [provider]  metFORMIN (GLUCOPHAGE) 500 MG tablet Take 500 mg by mouth daily with breakfast.   Yes [provider]  metoprolol succinate (TOPROL-XL) 25 MG 24 hr tablet Take 1 tablet (25 mg total) by mouth daily. Take with or immediately following a meal. 07/19/22  Yes Branch, Alben Spittle, MD  Omega-3 Fatty Acids (FISH OIL) 1200 MG CAPS Take 1,200 mg by mouth daily with breakfast.   Yes [provider]  omeprazole (PRILOSEC) 40 MG capsule Take 40 mg by mouth daily. 08/17/20  Yes [provider]  rivaroxaban (XARELTO) 20 MG TABS tablet Take 20 mg by mouth at bedtime.    Yes [provider]  RYBELSUS 14 MG TABS Take 14 mg by mouth in the morning. 07/27/21  Yes [provider]  sertraline (ZOLOFT) 50 MG tablet Take 50 mg by mouth in the morning. 01/01/21  Yes [provider]    Family History Family History  Problem Relation Age of Onset   Breast cancer Mother    Heart attack Father    Diabetes Father    Congestive Heart Failure Father    Breast cancer Maternal Grandmother     Social History Social History   Tobacco Use   Smoking status: Never   Smokeless tobacco: Never  Vaping Use   Vaping Use: Never used  Substance Use Topics   Alcohol use: Never   Drug use:  Never     Allergies   Acetaminophen-codeine, Bee venom, Meloxicam, Tramadol, Codeine, Coconut (cocos nucifera), and Tomato   Review of Systems Review of Systems  Constitutional: Negative.   HENT: Negative.    Respiratory: Negative.    Cardiovascular: Negative.   Gastrointestinal: Negative.   Musculoskeletal:  Positive for arthralgias and myalgias.  Neurological:  Positive for weakness and numbness.  Psychiatric/Behavioral: Negative.       Physical Exam Triage Vital Signs ED Triage Vitals  Enc Vitals Group     BP 11/26/22 1520 108/73     Pulse Rate 11/26/22 1520 69     Resp 11/26/22 1520 16     Temp 11/26/22 1520 98.2 F (36.8 C)     Temp Source 11/26/22 1520 Oral     SpO2 11/26/22 1520 95 %     Weight --      Height --      Head Circumference --      Peak Flow --      Pain Score 11/26/22 1517 10     Pain Loc --      Pain Edu? --      Excl. in GC? --    No data found.  Updated Vital Signs BP 108/73 (BP Location: Right Wrist)   Pulse 69   Temp 98.2 F (36.8 C) (Oral)   Resp 16   SpO2 95%   Visual Acuity Right Eye Distance:   Left Eye Distance:   Bilateral Distance:    Right Eye Near:   Left Eye Near:    Bilateral Near:     Physical Exam Constitutional:      Appearance: Normal appearance. She is obese.  Cardiovascular:     Rate and Rhythm: Normal rate.  Pulmonary:     Effort: Pulmonary effort is normal.  Musculoskeletal:     Comments: No spinous tenderness or paraspinal tenderness;  full rom of the neck. TTP to the left shoulder;  Tinels causes pain in the arm, spurlings causes pain in the  arm, but no numbness/tingling noted;   +TTP to the lumbar spine;  + tenderness across the low back;  exam of knee is difficult due to patient size;  full rom of the knee; normal strength at the LE;    Skin:    General: Skin is warm.  Neurological:     General: No focal deficit present.     Mental Status: She is alert.     Motor: No weakness.      Coordination: Coordination normal.     Gait: Gait normal.     Deep Tendon Reflexes: Reflexes normal.  Psychiatric:        Mood and Affect: Mood normal.      UC Treatments / Results  Labs (all labs ordered are listed, but only abnormal results are displayed) Labs Reviewed - No data to display  EKG   Radiology DG Lumbar Spine 2-3 Views  Result Date: 11/26/2022 CLINICAL DATA:  Lumbar region back pain EXAM: LUMBAR SPINE - 2-3 VIEW COMPARISON:  CT 07/19/2021 FINDINGS: Five lumbar type vertebral bodies. Mild disc space narrowing at L4-5 and L5-S1. Lower lumbar facet arthropathy, with degenerative anterolisthesis at L4-5 of 5 mm. These findings could certainly be symptomatic. IVC filter is in place. Large amount of gas in the sigmoid colon. IMPRESSION: 1. Lower lumbar degenerative disc disease and degenerative facet disease, with degenerative anterolisthesis of L4 on L5 of 5 mm. This could certainly be symptomatic. 2. IVC filter in place. Electronically Signed   By: Paulina Fusi M.D.   On: 11/26/2022 16:23   DG Shoulder Left  Result Date: 11/26/2022 CLINICAL DATA:  Pain EXAM: LEFT SHOULDER - 2+ VIEW COMPARISON:  10/08/2022 FINDINGS: There is no evidence of fracture or dislocation. Mild acromioclavicular arthrosis. Glenohumeral joint space is preserved. Possible small chronic Hill-Sachs deformity of the humeral head. Soft tissues are unremarkable. IMPRESSION: 1. No acute fracture or dislocation of the left shoulder. 2. Mild acromioclavicular arthrosis. Glenohumeral joint space is preserved. 3. Possible small chronic Hill-Sachs deformity of the humeral head. Correlate for any history of dislocation. Electronically Signed   By: Jearld Lesch M.D.   On: 11/26/2022 16:20   DG Cervical Spine Complete  Result Date: 11/26/2022 CLINICAL DATA:  Knee pain. EXAM: CERVICAL SPINE - COMPLETE 4+ VIEW COMPARISON:  CT cervical spine 07/19/2021. FINDINGS: The prevertebral soft tissues are normal. There is reversal  of the usual cervical lordosis without focal angulation. There is no evidence of acute fracture or traumatic subluxation. The C1-2 articulation appears normal in the AP projection. Mild multilevel spondylosis with disc space narrowing and uncinate spurring greatest from C4-5 through C6-7. No high-grade foraminal narrowing demonstrated on the oblique views. IMPRESSION: Mild cervical spondylosis without acute findings or high-grade foraminal narrowing. Electronically Signed   By: Carey Bullocks M.D.   On: 11/26/2022 16:19   DG Knee 2 Views Right  Result Date: 11/26/2022 CLINICAL DATA:  Right knee pain EXAM: RIGHT KNEE - 1-2 VIEW COMPARISON:  10/08/2021 FINDINGS: No visible effusion. No weight-bearing compartment narrowing. Irregularity of the posterior margin of the patella is consistent with chondromalacia patella. No other focal bone finding. IMPRESSION: Chondromalacia patella. No weight-bearing compartment narrowing. No effusion. Electronically Signed   By: Paulina Fusi M.D.   On: 11/26/2022 16:19    Procedures Procedures (including critical care time)  Medications Ordered in UC Medications - No data to display  Initial Impression / Assessment and Plan / UC Course  I have reviewed the triage vital signs and the nursing notes.  Pertinent labs & imaging results that were available during my care of the patient were reviewed by me and considered in my medical decision making (see chart for details).    Final Clinical Impressions(s) / UC Diagnoses   Final diagnoses:  Acute pain of left shoulder  Left arm pain  Numbness and tingling in left arm  Acute bilateral low back pain without sciatica  Acute pain of right knee     Discharge Instructions      You were seen today for various issues.  Your knee xray was normal.  The shoulder xray looks ok today.  The cervical spine did show mild disc space narrowing.  The low back xray showed degenerative disc disease which may be causing your leg  symptoms.  I recommend you discuss further with your primary care provider in several days.  You may need to see orthopedics or have further testing done to better pinpoint the cause of your symptoms.   There is no further testing I can do here today.  In the mean time I recommend you be careful with ambulating in case the right leg/knee gives out, and avoid carrying/holding things in your right arm.     ED Prescriptions   None    PDMP not reviewed this encounter.   Jannifer Franklin, MD 11/27/22 [phone]

## 2022-11-26 NOTE — ED Triage Notes (Signed)
Pt is here for left shoulder pain . Pt states sometimes the arm goes numb. Pt denies chest pain , pt also complains of right knee pain x 2days

## 2022-11-26 NOTE — Discharge Instructions (Addendum)
You were seen today for various issues.  Your knee xray was normal.  The shoulder xray looks ok today.  The cervical spine did show mild disc space narrowing.  The low back xray showed degenerative disc disease which may be causing your leg symptoms.  I recommend you discuss further with your primary care provider in several days.  You may need to see orthopedics or have further testing done to better pinpoint the cause of your symptoms.   There is no further testing I can do here today.  In the mean time I recommend you be careful with ambulating in case the right leg/knee gives out, and avoid carrying/holding things in your right arm.

## 2022-11-28 ENCOUNTER — Ambulatory Visit (INDEPENDENT_AMBULATORY_CARE_PROVIDER_SITE_OTHER): Payer: 59 | Admitting: Licensed Clinical Social Worker

## 2022-11-28 ENCOUNTER — Encounter (HOSPITAL_COMMUNITY): Payer: Self-pay

## 2022-11-28 DIAGNOSIS — F331 Major depressive disorder, recurrent, moderate: Secondary | ICD-10-CM

## 2022-11-28 DIAGNOSIS — F4310 Post-traumatic stress disorder, unspecified: Secondary | ICD-10-CM | POA: Diagnosis not present

## 2022-11-28 DIAGNOSIS — F411 Generalized anxiety disorder: Secondary | ICD-10-CM

## 2022-11-28 NOTE — Progress Notes (Signed)
Virtual Visit via Video Note   I connected with Meghan [name] on 11/28/22 at 2:00pm by video enabled telemedicine application and verified that I am speaking with the correct person using two identifiers.   I discussed the limitations, risks, security and privacy concerns of performing an evaluation and management service by video and the availability of in person appointments. I also discussed with the patient that there may be a patient responsible charge related to this service. The patient expressed understanding and agreed to proceed.   I discussed the assessment and treatment plan with the patient. The patient was provided an opportunity to ask questions and all were answered. The patient agreed with the plan and demonstrated an understanding of the instructions.   The patient was advised to call back or seek an in-person evaluation if the symptoms worsen or if the condition fails to improve as anticipated.   I provided 48 minutes of non-face-to-face time during this encounter.     [name], LCSW, LCAS _______________________ THERAPIST PROGRESS NOTE   Session Time:  2:00pm - 2:48pm    Location: Patient: Patient Home Provider: [facility]   Participation Level: Active    Behavioral Response: Alert, casually dressed, euthymic mood/affect    Type of Therapy:  Individual Therapy   Treatment Goals addressed: Mood management; Medication management; [place] attendance; Exercise regimen  Progress Towards Goals: Progressing    Interventions: CBT, treatment planning    Summary: Meghan [name] is a 57 year old divorced African American female that presented for therapy appointment today with diagnoses of Major Depressive Disorder, recurrent, moderate, Generalized Anxiety Disorder and PTSD.      Suicidal/Homicidal: None; without plan or intent                                                                                                                Therapist Response:  Clinician  met with Meghan [name] for virtual therapy appointment and assessed for safety, sobriety, and medication compliance.  Meghan [name] presented for session on time and was alert, oriented x5, with no evidence or self-report of active SI/HI or A/V H.  Meghan [name] reported ongoing compliance with medication and denied any use of alcohol or illicit substances.  Clinician inquired about Meghan [name]'s current emotional ratings, as well as any significant changes in thoughts, feelings, or behavior since previous check-in.  Meghan [name] reported scores of 0/10 for depression, 0/10 for anxiety, and 0/10 for anger/irritability.  Meghan [name] denied any recent outbursts or panic attacks.  Meghan [name] reported that a recent success has been continuing to work at her new job, which she is enjoying.  She reported that an additional success was moving into her new place, which was accomplished with help from friends and family.  Clinician proposed revisiting treatment plan today to make updates, as Meghan [name] denied any current struggles to address today.  She was agreeable to this, so clinician collaborated with Meghan [name] to make changes to treatment plan as follows with her verbal consent: Meet with clinician biweekly for therapy sessions to update  on goal progress and address any needs that arise; Follow up with psychiatrist once every 3 months regarding efficacy of medication and any dose modification necessary; Take medications daily as prescribed to aid in symptom reduction and improvement of daily functioning; Maintain depression at average daily severity of 0/10 for the next 90 days by attending regular therapy sessions, and engaging in healthy self-care activities daily to keep mind engaged such as reading, going for walks in the park, and speaking with family x3 weekly for support; Maintain average daily anxiety level at severity of 0/10 over next 90 days by practicing relaxation techniques with proven efficacy 2-3x daily, in addition to challenging anxious thoughts that arise  to negate negative impact on outlook; Exercise for at least 1 hour, x3 a week in addition to following heart-healthy diet to improve both physical and mental well-being per PCP recommendations; Attend church meetings with clergy members 1-2x per week to stay productive, engaged with supportive community, and maintain spiritual support, in addition to any other available activities such as dance ministry once per week/once per month; Maintain healthier boundaries with all supports to avoid worsening anxiety and depression, as well as enforce assertive communication skills, with goal of limiting contact with toxic supports until more respectful communication patterns are established; Commit to at least 2 hours of writing per week to stay on track with completion of book by end of 2024 and achieve creative emotional outlet/release from difficult events in past; Maintain high self-esteem (8/10 or above) over next 3 months by continuing practice of daily positive affirmations, challenging negative core beliefs related to self-image, and asserting needs more readily when interpersonal issues arise; Continue to work 6 hours per week at [employer] caring for children x2 per week in order to preoccupy idle time and contribute to sense of purpose; Voluntarily seek admission to hospital for crisis intervention should SI/HI or A/V H appear and put safety of self or others at risk.  Progress is evidenced by Meghan [name] consistently attending therapy sessions, keeping up with her medical provider, taking medications as prescribed, increasing self-esteem to 8/10 over recent months, resolving previous grief issues, and regularly attending new church and activities at that location with clergy.  She reported that she does need to improve overall exercise, and get back into routine of working on her book to finish it before the end of the year. Meghan [name] reported that she feels ready to reduce frequency of therapy sessions down to biweekly,  and then consider once per month thereafter based upon progress being seen.  She stated "I'm doing a lot better than I was before.  I feel like I can manage on my own more, even when we can't see each other as often". Clinician will continue to monitor.            Plan: Follow up again in 2 weeks.    Diagnosis: Major depressive disorder, recurrent, moderate; Generalized Anxiety Disorder; and PTSD   Collaboration of Care:   No collaboration of care required for this visit.                                                   Patient/Guardian was advised Release of Information must be obtained prior to any record release in order to collaborate their care with an outside provider. Patient/Guardian was advised if they have  not already done so to contact the registration department to sign all necessary forms in order for [name] to release information regarding their care.    Consent: Patient/Guardian gives verbal consent for treatment and assignment of benefits for services provided during this visit. Patient/Guardian expressed understanding and agreed to proceed.  [name], [place], LCAS 11/28/22

## 2022-11-29 ENCOUNTER — Other Ambulatory Visit: Payer: Self-pay | Admitting: Endocrinology

## 2022-11-29 DIAGNOSIS — Z1231 Encounter for screening mammogram for malignant neoplasm of breast: Secondary | ICD-10-CM

## 2022-12-05 ENCOUNTER — Encounter: Payer: Medicare Other | Admitting: Podiatry

## 2022-12-06 ENCOUNTER — Encounter: Payer: Self-pay | Admitting: Cardiovascular Disease

## 2022-12-06 ENCOUNTER — Ambulatory Visit: Payer: 59 | Attending: Cardiovascular Disease | Admitting: Cardiovascular Disease

## 2022-12-06 ENCOUNTER — Ambulatory Visit
Admission: RE | Admit: 2022-12-06 | Discharge: 2022-12-06 | Disposition: A | Discharge status: Home / Self Care | Payer: 59 | Source: Ambulatory Visit | Attending: Endocrinology | Admitting: Endocrinology

## 2022-12-06 VITALS — BP 98/66 | HR 63 | Ht 65.0 in | Wt 313.6 lb

## 2022-12-06 DIAGNOSIS — I483 Typical atrial flutter: Secondary | ICD-10-CM | POA: Diagnosis not present

## 2022-12-06 DIAGNOSIS — Z1231 Encounter for screening mammogram for malignant neoplasm of breast: Secondary | ICD-10-CM

## 2022-12-06 NOTE — Progress Notes (Signed)
Electrophysiology Office Note:    Date:  12/06/2022   ID:  Meghan [name], DOB 1966/05/01, MRN [id number]  PCP:  Beverley Fiedler, [place] [facility] Cardiologist:  Janina Mayo, MD Electrophysiologist:  Melida Quitter, MD     Referring MD: Beverley Fiedler, *   History of Present Illness:    Meghan [name] is a 57 y.o. female with a hx listed below, significant for multiple PEs now s/p IVC filter, stroke, HF with recovered EF, AF referred for arrhythmia management. I reviewed Dr. Nelly Laurence note from 07/19/22.  I reviewed all ECGs available in [facility]. With the exception of one ECG in 2020 that shows atrial fibrillation, all other ECGs show typical atrial flutter. She has had several ER visits with atrial flutter since arriving to the area in 2020.   She underwent ablation of typical atrial flutter in February 2024.  There were no immediate postprocedural complications.  She returned 5 days later with a right femoral vein hematoma.  She reports today that her right groin has returned to normal; she does not have any numbness discomfort or bruising.  She has shortness of breath and fatigue, palpitations. No chest pain, syncope, pre-syncope.  Past Medical History:  Diagnosis Date   Anticoagulated    xarelto--- managed by cardiology   Anxiety    Arthritis    left knee   Asthma    Atrial fibrillation ([place]) 02/16/2019   CHF (congestive heart failure) ([place]) 01/2019   Physician from [place] informed her she had this    Diabetes mellitus without complication ([place])    Dysrhythmia    Atrial fibrillation   GERD (gastroesophageal reflux disease)    Hiatal hernia    History of pulmonary embolus (PE)    Hypercholesterolemia    Intractable nausea and vomiting 11/16/2020   Macular degeneration    of left eye   MDD (major depressive disorder)    Osteoarthritis    knees   Pneumonia    in the past   PTSD (post-traumatic stress disorder)    Pulmonary embolism ([place])     bilateral, none since 2003   Seizure disorder ([place])    Per patient last seizure was in 2020.   Sleep apnea CPAP   [facility]  Uses CPAP   Stroke ([place])    1989 and 1995 (left sided weakness)   Vitamin D deficiency     Past Surgical History:  Procedure Laterality Date   A-FLUTTER ABLATION N/A 11/02/2022   Procedure: A-FLUTTER ABLATION;  Surgeon: Alzora Ha, Yetta Barre, MD;  Location: [facility];  Service: Cardiovascular;  Laterality: N/A;   ABDOMINAL HYSTERECTOMY  2003   complete   CARDIAC CATHETERIZATION  2017   in [facility] [place] N/A 08/22/2022   Procedure: CARDIOVERSION;  Surgeon: Berniece Salines, DO;  Location: [facility];  Service: Cardiovascular;  Laterality: N/A;   CESAREAN SECTION  [id number]   x 2   CHOLECYSTECTOMY  2003   COLONOSCOPY WITH PROPOFOL N/A 06/05/2019   Procedure: COLONOSCOPY WITH PROPOFOL;  Surgeon: Carol Ada, MD;  Location: [facility];  Service: Endoscopy;  Laterality: N/A;   CYST REMOVAL WITH BONE GRAFT Left 10/18/2020   Procedure: BONE GRAFTING OF ENCHONDROMA MIDDLE [place] OF LEFT MIDDLE FINGER;  Surgeon: Daryll Brod, MD;  Location: [facility];  Service: Orthopedics;  Laterality: Left;  AXILLARY BLOCK   DIAGNOSTIC LAPAROSCOPY  2015; 2017   lap hernia repair x2   endoscopy  ENDOVENOUS ABLATION SAPHENOUS VEIN W/ LASER Left 08/31/2021   endovenous laser ablation left greater saphenous vein and stab phlebectomy 10-20 incisions left leg by Servando Snare MD   HALLUX FUSION Right 05/28/2022   Procedure: HALLUX FUSION METATARSAL PHALANGEAL JOIT;  Surgeon: [name], DPM;  Location: [facility];  Service: Podiatry;  Laterality: Right;  BLOCK   HERNIA REPAIR  [id number], [id number]   umbilical hernia repair   IVC FILTER INSERTION  2003   Hx pulmonary embolus   POLYPECTOMY  06/05/2019   Procedure: POLYPECTOMY;  Surgeon: Carol Ada, MD;  Location: [facility];  Service: Endoscopy;;   SHOULDER ARTHROSCOPY W/ ROTATOR  CUFF REPAIR  08/28/2017   right shoulder   WRIST ARTHROSCOPY WITH DEBRIDEMENT Left 10/18/2020   Procedure: LEFT WRIST ARTHROSCOPY WITH DEBRIDEMENT;  Surgeon: Daryll Brod, MD;  Location: [facility];  Service: Orthopedics;  Laterality: Left;  AXILLARY BLOCK   WRIST ARTHROSCOPY WITH DEBRIDEMENT Right 01/25/2022   Procedure: ARTHROSCOPY RIGHT WRIST WITH  DEBRIDEMENT/ SHRINKAGE;  Surgeon: Leanora Cover, MD;  Location: [facility];  Service: Orthopedics;  Laterality: Right;    Current Medications: Current Meds  Medication Sig   acetaminophen (TYLENOL) 500 MG tablet Take 1,000 mg by mouth every 6 (six) hours as needed for moderate pain.   albuterol (VENTOLIN HFA) 108 (90 Base) MCG/ACT inhaler Inhale 2 puffs into the lungs every 6 (six) hours as needed for wheezing or shortness of breath.   atorvastatin (LIPITOR) 40 MG tablet Take 40 mg by mouth every evening.   BREZTRI AEROSPHERE 160-9-4.8 MCG/ACT AERO Inhale 2 puffs into the lungs in the morning and at bedtime.    bumetanide (BUMEX) 1 MG tablet Take 1 mg by mouth in the morning.   Cholecalciferol (VITAMIN D-3) 125 MCG (5000 UT) TABS Take 5,000 Units by mouth daily.   flecainide (TAMBOCOR) 50 MG tablet Take 50 mg by mouth 2 (two) times daily.    Menthol-Camphor (TIGER BALM ARTHRITIS RUB) 11-11 % CREA Apply 1 application  topically daily as needed (Knee pain).   metFORMIN (GLUCOPHAGE) 500 MG tablet Take 500 mg by mouth daily with breakfast.   metoprolol succinate (TOPROL-XL) 25 MG 24 hr tablet Take 1 tablet (25 mg total) by mouth daily. Take with or immediately following a meal.   Omega-3 Fatty Acids (FISH OIL) 1200 MG CAPS Take 1,200 mg by mouth daily with breakfast.   omeprazole (PRILOSEC) 40 MG capsule Take 40 mg by mouth daily.   rivaroxaban (XARELTO) 20 MG TABS tablet Take 20 mg by mouth at bedtime.    RYBELSUS 14 MG TABS Take 14 mg by mouth in the morning.   sertraline (ZOLOFT) 50 MG tablet Take 50 mg by mouth in the morning.     Allergies:    Acetaminophen-codeine, Bee venom, Meloxicam, Tramadol, Codeine, Coconut (cocos nucifera), and Tomato   Social History   Socioeconomic History   Marital status: Divorced    Spouse name: Not on file   Number of children: 3   Years of education: Not on file   Highest education level: Not on file  Occupational History   Not on file  Tobacco Use   Smoking status: Never   Smokeless tobacco: Never  Vaping Use   Vaping Use: Never used  Substance and Sexual Activity   Alcohol use: Never   Drug use: Never   Sexual activity: Not Currently    Birth control/protection: Surgical    Comment: Hysterectomy  Other Topics Concern   Not on file  Social History Narrative   ** Merged History Encounter **       Social Determinants of Health   Financial Resource Strain: Not on file  Food Insecurity: No Food Insecurity (11/08/2022)   Hunger Vital Sign    Worried About Running Out of Food in the Last Year: Never true    Ran Out of Food in the Last Year: Never true  Transportation Needs: No Transportation Needs (11/08/2022)   PRAPARE - Hydrologist (Medical): No    Lack of Transportation (Non-Medical): No  Physical Activity: Not on file  Stress: Not on file  Social Connections: Not on file     Family History: The patient's family history includes Breast cancer in her maternal grandmother and mother; Congestive Heart Failure in her father; Diabetes in her father; Heart attack in her father.  ROS:   Please see the history of present illness.    All other systems reviewed and are negative.  EKGs/Labs/Other Studies Reviewed Today:     TTE 07/2022: Normal EF, mildly dilated LA  EKG:  Last EKG results: today - Atrial flutter with variable conduction   Recent Labs: 11/08/2022: BUN 9; Creatinine, Ser 0.69; Hemoglobin 10.2; Magnesium 1.9; Platelets 223; Potassium 3.7; Sodium 139     Physical Exam:    VS:  BP 98/66   Pulse 63   Ht 5\' 5"  (1.651 m)   Wt (!) 313  lb 9.6 oz (142.2 kg)   SpO2 99%   BMI 52.19 kg/m     Wt Readings from Last 3 Encounters:  12/06/22 (!) 313 lb 9.6 oz (142.2 kg)  11/02/22 (!) 307 lb (139.3 kg)  10/24/22 (!) 313 lb (142 kg)     GEN:  Well nourished, well developed in no acute distress, obese CARDIAC: Irregular rhythm, no murmurs, rubs, gallops RESPIRATORY:  Normal work of breathing MUSCULOSKELETAL: 1+ edema    ASSESSMENT & PLAN:    Atrial flutter: multiple recurrences resulting in hospitalization and CHF.  - Status post ablation of isthmus dependent counterclockwise atrial flutter, doing well   Atrial fibrillation: not documented on ECG since 2020. Will continue to monitor after AF ablation   - we discussed the importance of weight loss for maintenance of sinus rhythm.  Longterm use of anticoagulation: history of recurrent PE and stroke. She will be on life-long anticoagulation        Medication Adjustments/Labs and Tests Ordered: Current medicines are reviewed at length with the patient today.  Concerns regarding medicines are outlined above.  Orders Placed This Encounter  Procedures   EKG 12-Lead   No orders of the defined types were placed in this encounter.    Signed, Melida Quitter, MD  12/06/2022 10:09 AM    [place]

## 2022-12-06 NOTE — Patient Instructions (Signed)
Medication Instructions:  Your physician recommends that you continue on your current medications as directed. Please refer to the Current Medication list given to you today.  *If you need a refill on your cardiac medications before your next appointment, please call your pharmacy*  Lab Work: None ordered  Testing/Procedures: None ordered  Follow-Up: At [facility], you and your health needs are our priority.  As part of our continuing mission to provide you with exceptional heart care, we have created designated Provider Care Teams.  These Care Teams include your primary Cardiologist (physician) and Advanced Practice Providers (APPs -  Physician Assistants and Nurse Practitioners) who all work together to provide you with the care you need, when you need it.  Your next appointment:   1 year(s)  The format for your next appointment:   In Person  Provider:   You may see Augustus E Mealor, MD or one of the following Advanced Practice Providers on your designated Care Team:   Lashaunta Ursuy, PA-C Michael "Andy" Tillery, PA-C Suzann Riddle, NP{  

## 2022-12-10 ENCOUNTER — Other Ambulatory Visit: Payer: Self-pay | Admitting: Endocrinology

## 2022-12-10 DIAGNOSIS — R928 Other abnormal and inconclusive findings on diagnostic imaging of breast: Secondary | ICD-10-CM

## 2022-12-12 ENCOUNTER — Telehealth (HOSPITAL_COMMUNITY): Payer: Self-pay | Admitting: Licensed Clinical Social Worker

## 2022-12-12 ENCOUNTER — Encounter (HOSPITAL_COMMUNITY): Payer: Self-pay

## 2022-12-12 ENCOUNTER — Ambulatory Visit (HOSPITAL_COMMUNITY): Payer: 59 | Admitting: Licensed Clinical Social Worker

## 2022-12-12 NOTE — Telephone Encounter (Signed)
Ladina had a virtual therapy session scheduled today for 2pm.  Atarah presented for appointment on time, but informed clinician that she had a doctor's appointment come up, and was currently in the waiting room about to be called in.  Clinician recommended cancelling today's appointment due to lack of confidentiality offered in this setting, and lack of available time, as Nitu was called upon to meet with her doctor shortly after our meeting began.  Clinician encouraged Caylyn to reschedule for next week and be mindful of conflicting appointment times to avoid future cancellations.  Laveyah expressed understanding.  Clinician informed front desk staff of cancelled appointment.    [name], [place], LCAS 12/12/22

## 2022-12-18 ENCOUNTER — Ambulatory Visit (INDEPENDENT_AMBULATORY_CARE_PROVIDER_SITE_OTHER): Payer: 59 | Admitting: Licensed Clinical Social Worker

## 2022-12-18 ENCOUNTER — Other Ambulatory Visit: Payer: Medicare Other

## 2022-12-18 DIAGNOSIS — F4310 Post-traumatic stress disorder, unspecified: Secondary | ICD-10-CM | POA: Diagnosis not present

## 2022-12-18 DIAGNOSIS — F331 Major depressive disorder, recurrent, moderate: Secondary | ICD-10-CM

## 2022-12-18 DIAGNOSIS — F411 Generalized anxiety disorder: Secondary | ICD-10-CM

## 2022-12-18 NOTE — Progress Notes (Signed)
Virtual Visit via Video Note   I connected with Madesyn S. Georgi on 12/18/22 at 3:00pm by video enabled telemedicine application and verified that I am speaking with the correct person using two identifiers.   I discussed the limitations, risks, security and privacy concerns of performing an evaluation and management service by video and the availability of in person appointments. I also discussed with the patient that there may be a patient responsible charge related to this service. The patient expressed understanding and agreed to proceed.   I discussed the assessment and treatment plan with the patient. The patient was provided an opportunity to ask questions and all were answered. The patient agreed with the plan and demonstrated an understanding of the instructions.   The patient was advised to call back or seek an in-person evaluation if the symptoms worsen or if the condition fails to improve as anticipated.   I provided 1 hour of non-face-to-face time during this encounter.     [name], LCSW, LCAS _______________________ THERAPIST PROGRESS NOTE   Session Time:  3:00pm - 4:00pm     Location: Patient: Patient Home Provider: [facility] [place] [facility]   Participation Level: Active    Behavioral Response: Alert, casually dressed, euthymic mood/affect    Type of Therapy:  Individual Therapy   Treatment Goals addressed: Mood management; Medication management  Progress Towards Goals: Progressing    Interventions: CBT, anger management    Summary: Affie S. Bazer is a 57 year old divorced African American female that presented for therapy appointment today with diagnoses of Major Depressive Disorder, recurrent, moderate, Generalized Anxiety Disorder and PTSD.      Suicidal/Homicidal: None; without plan or intent                                                                                                                Therapist Response:  Clinician met with Mishayla for virtual therapy session  and assessed for safety, sobriety, and medication compliance.  Cianna presented for appointment on time and was alert, oriented x5, with no evidence or self-report of active SI/HI or A/V H.  Relena reported ongoing compliance with medication and denied any use of alcohol or illicit substances.  Clinician inquired about Floreen's emotional ratings today, as well as any significant changes in thoughts, feelings, or behavior since last check-in.  Dezirae reported scores of 0/10 for depression, 0/10 for anxiety, and 0/10 for anger/irritability.  Elma denied any recent panic attacks.  Malasha reported that a recent struggle was experiencing heightened anger last week, which started Wednesday, lasted until Friday, and eventually led to an outburst.  Clinician utilized an anger management handout with [employer] in session to assist.  This handout explained how anger tends to be an emotion that is easily identifiable due to outward behavior such as frequent emotional outbursts, but can conceal other difficult emotions under the surface if compartmentalization is used.  A list of common emotions linked to anger was provided, and Alejandra was tasked with identifying which ones could have served as  triggers for recent mood shift.  Clinician also encouraged Diavian to consider self-care activities which could offer an appropriate stress outlet each day, and explored the subject with a list of various hobbies/coping skills that could be helpful to prioritize in routine.  Intervention was effective, as evidenced by Azyah's active engagement in discussion on topic, and successful identification of numerous triggers which influenced her emotional state over recent days, including having an invasive doctor's appointment, a lengthy wait on medical transportation, feeling fatigued, and having a grocery cart hit her and knock her over.  Sabrea reported that she would plan to minimize exposure to any other stressors as much as possible, practice  positive self-talk, deep breathing, and speak to supports that can empathize with her struggles.  Mieke stated "I need to set a good example for my kids and grandkids".  Clinician will continue to monitor.            Plan: Follow up again in 2 weeks.    Diagnosis: Major depressive disorder, recurrent, moderate; Generalized Anxiety Disorder; and PTSD   Collaboration of Care:   No collaboration of care required for this visit.                                                   Patient/Guardian was advised Release of Information must be obtained prior to any record release in order to collaborate their care with an outside provider. Patient/Guardian was advised if they have not already done so to contact the registration department to sign all necessary forms in order for [name] to release information regarding their care.    Consent: Patient/Guardian gives verbal consent for treatment and assignment of benefits for services provided during this visit. Patient/Guardian expressed understanding and agreed to proceed.  [name], LCSW, LCAS 12/18/22

## 2022-12-19 ENCOUNTER — Encounter: Payer: Medicare Other | Admitting: Podiatry

## 2022-12-20 ENCOUNTER — Other Ambulatory Visit (HOSPITAL_COMMUNITY): Payer: Self-pay | Admitting: Gastroenterology

## 2022-12-20 DIAGNOSIS — R1319 Other dysphagia: Secondary | ICD-10-CM

## 2022-12-24 ENCOUNTER — Ambulatory Visit
Admission: RE | Admit: 2022-12-24 | Discharge: 2022-12-24 | Disposition: A | Discharge status: Home / Self Care | Payer: 59 | Source: Ambulatory Visit | Attending: Endocrinology | Admitting: Endocrinology

## 2022-12-24 DIAGNOSIS — R928 Other abnormal and inconclusive findings on diagnostic imaging of breast: Secondary | ICD-10-CM

## 2022-12-25 ENCOUNTER — Ambulatory Visit (INDEPENDENT_AMBULATORY_CARE_PROVIDER_SITE_OTHER): Payer: 59

## 2022-12-25 ENCOUNTER — Ambulatory Visit (HOSPITAL_COMMUNITY)
Admission: EM | Admit: 2022-12-25 | Discharge: 2022-12-25 | Disposition: A | Discharge status: Home / Self Care | Payer: 59 | Attending: Emergency Medicine | Admitting: Emergency Medicine

## 2022-12-25 ENCOUNTER — Encounter (HOSPITAL_COMMUNITY): Payer: Self-pay

## 2022-12-25 DIAGNOSIS — R0602 Shortness of breath: Secondary | ICD-10-CM

## 2022-12-25 DIAGNOSIS — R600 Localized edema: Secondary | ICD-10-CM | POA: Diagnosis not present

## 2022-12-25 MED ORDER — BUMETANIDE 1 MG PO TABS: 1.0000 mg | ORAL_TABLET | Freq: Every day | ORAL | 0 refills | Status: AC

## 2022-12-25 NOTE — ED Triage Notes (Signed)
Pt presents with c/o bilateral leg swelling x 4 days.

## 2022-12-25 NOTE — Discharge Instructions (Addendum)
Declined avs 

## 2022-12-25 NOTE — ED Provider Notes (Signed)
[facility]    CSN: [id number] Arrival date & time: 12/25/22  1354      History   Chief Complaint Chief Complaint  Patient presents with   Leg Swelling    I have chf, and I'm having problems with shortness of breath some swelling in my legs, dizziness and a severe headache. - Entered by patient    HPI Meghan [name] is a 57 y.o. female.   Patient presents for evaluation of shortness of breath with exertion and bilateral lower extremity swelling beginning 4 days ago.  Endorses that she has gained 5 pounds in the last 3 days, took an extra dose of Bumex as recommended by her current allergist and is currently 4 pounds up from baseline weight.  History of CHF.    Past Medical History:  Diagnosis Date   Anticoagulated    xarelto--- managed by cardiology   Anxiety    Arthritis    left knee   Asthma    Atrial fibrillation 02/16/2019   CHF (congestive heart failure) 01/2019   Physician from [place] informed her she had this    Diabetes mellitus without complication    Dysrhythmia    Atrial fibrillation   GERD (gastroesophageal reflux disease)    Hiatal hernia    History of pulmonary embolus (PE)    Hypercholesterolemia    Intractable nausea and vomiting 11/16/2020   Macular degeneration    of left eye   MDD (major depressive disorder)    Osteoarthritis    knees   Pneumonia    in the past   PTSD (post-traumatic stress disorder)    Pulmonary embolism    bilateral, none since 2003   Seizure disorder    Per patient last seizure was in 2020.   Sleep apnea CPAP   [employer]  Uses CPAP   Stroke    1989 and 1995 (left sided weakness)   Vitamin D deficiency     Patient Active Problem List   Diagnosis Date Noted   Vascular complication 11/07/2022   MDD (major depressive disorder), recurrent episode, moderate 07/30/2022   GAD (generalized anxiety disorder) 07/30/2022   PTSD (post-traumatic stress disorder) 07/30/2022   Insomnia 05/24/2022   Arthritis of first  metatarsophalangeal (MTP) joint of right foot 04/25/2022   Intractable nausea and vomiting 11/16/2020   Ileus 11/14/2020   Fecal impaction 11/14/2020   Left-sided weakness 09/29/2020   Asthma 09/29/2020   Pre-diabetes 09/29/2020   Pain in left finger(s) 07/12/2020   Seizure-like activity 03/25/2020   History of head injury 03/25/2020   Obstructive sleep apnea syndrome 09/04/2019   Acute medial meniscus tear of right knee 07/10/2019   Class 3 severe obesity due to excess calories with serious comorbidity and body mass index (BMI) of 50.0 to 59.9 in adult 06/01/2019   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   Atrial fibrillation 04/17/2019   History of pulmonary embolus (PE) 04/17/2019    Past Surgical History:  Procedure Laterality Date   A-FLUTTER ABLATION N/A 11/02/2022   Procedure: A-FLUTTER ABLATION;  Surgeon: Maurice Small, MD;  [facility];  Service: Cardiovascular;  Laterality: N/A;   ABDOMINAL HYSTERECTOMY  2003   complete   CARDIAC CATHETERIZATION  2017   in [facility] in [place]   CARDIOVERSION N/A 08/22/2022   Procedure: CARDIOVERSION;  Surgeon: Thomasene Ripple, DO;  Location: [facility];  Service: Cardiovascular;  Laterality: N/A;   CESAREAN SECTION  [phone]   x 2   CHOLECYSTECTOMY  2003  COLONOSCOPY WITH PROPOFOL N/A 06/05/2019   Procedure: COLONOSCOPY WITH PROPOFOL;  Surgeon: Jeani Hawking, MD;  Location: [facility];  Service: Endoscopy;  Laterality: N/A;   CYST REMOVAL WITH BONE GRAFT Left 10/18/2020   Procedure: BONE GRAFTING OF ENCHONDROMA MIDDLE PHANLANX OF LEFT MIDDLE FINGER;  Surgeon: Cindee Salt, MD;  Location: [facility];  Service: Orthopedics;  Laterality: Left;  AXILLARY BLOCK   DIAGNOSTIC LAPAROSCOPY  2015; 2017   lap hernia repair x2   endoscopy     ENDOVENOUS ABLATION SAPHENOUS VEIN W/ LASER Left 08/31/2021   endovenous laser ablation left greater saphenous vein and stab phlebectomy 10-20 incisions left leg by Lemar Livings MD    HALLUX FUSION Right 05/28/2022   Procedure: HALLUX FUSION METATARSAL PHALANGEAL JOIT;  Surgeon: Candelaria Stagers, DPM;  Location: [facility];  Service: Podiatry;  Laterality: Right;  BLOCK   HERNIA REPAIR  2014, 2018   umbilical hernia repair   IVC FILTER INSERTION  2003   Hx pulmonary embolus   POLYPECTOMY  06/05/2019   Procedure: POLYPECTOMY;  Surgeon: Jeani Hawking, MD;  Location: [facility];  Service: Endoscopy;;   SHOULDER ARTHROSCOPY W/ ROTATOR CUFF REPAIR  08/28/2017   right shoulder   WRIST ARTHROSCOPY WITH DEBRIDEMENT Left 10/18/2020   Procedure: LEFT WRIST ARTHROSCOPY WITH DEBRIDEMENT;  Surgeon: Cindee Salt, MD;  [facility];  Service: Orthopedics;  Laterality: Left;  AXILLARY BLOCK   WRIST ARTHROSCOPY WITH DEBRIDEMENT Right 01/25/2022   Procedure: ARTHROSCOPY RIGHT WRIST WITH  DEBRIDEMENT/ SHRINKAGE;  Surgeon: Betha Loa, MD;  Location: [facility];  Service: Orthopedics;  Laterality: Right;    OB History   No obstetric history on file.      Home Medications    Prior to Admission medications   Medication Sig Start Date End Date Taking? Authorizing Provider  acetaminophen (TYLENOL) 500 MG tablet Take 1,000 mg by mouth every 6 (six) hours as needed for moderate pain.    [provider]  albuterol (VENTOLIN HFA) 108 (90 Base) MCG/ACT inhaler Inhale 2 puffs into the lungs every 6 (six) hours as needed for wheezing or shortness of breath.    [provider]  atorvastatin (LIPITOR) 40 MG tablet Take 40 mg by mouth every evening. 07/26/21   [provider]  BREZTRI AEROSPHERE 160-9-4.8 MCG/ACT AERO Inhale 2 puffs into the lungs in the morning and at bedtime.  03/24/20   [provider]  bumetanide (BUMEX) 1 MG tablet Take 1 mg by mouth in the morning. 10/24/21   [provider]  Cholecalciferol (VITAMIN D-3) 125 MCG (5000 UT) TABS Take 5,000 Units by mouth daily.    [provider]  flecainide (TAMBOCOR) 50 MG  tablet Take 50 mg by mouth 2 (two) times daily.  09/04/19   [provider]  Menthol-Camphor (TIGER BALM ARTHRITIS RUB) 11-11 % CREA Apply 1 application  topically daily as needed (Knee pain).    [provider]  metFORMIN (GLUCOPHAGE) 500 MG tablet Take 500 mg by mouth daily with breakfast.    [provider]  metoprolol succinate (TOPROL-XL) 25 MG 24 hr tablet Take 1 tablet (25 mg total) by mouth daily. Take with or immediately following a meal. 07/19/22   Maisie Fus, MD  Omega-3 Fatty Acids (FISH OIL) 1200 MG CAPS Take 1,200 mg by mouth daily with breakfast.    [provider]  omeprazole (PRILOSEC) 40 MG capsule Take 40 mg by mouth daily. 08/17/20   [provider]  rivaroxaban Carlena Hurl)  20 MG TABS tablet Take 20 mg by mouth at bedtime.     [provider]  RYBELSUS 14 MG TABS Take 14 mg by mouth in the morning. 07/27/21   [provider]  sertraline (ZOLOFT) 50 MG tablet Take 50 mg by mouth in the morning. 01/01/21   [provider]    Family History Family History  Problem Relation Age of Onset   Breast cancer Mother    Heart attack Father    Diabetes Father    Congestive Heart Failure Father    Breast cancer Maternal Grandmother     Social History Social History   Tobacco Use   Smoking status: Never   Smokeless tobacco: Never  Vaping Use   Vaping Use: Never used  Substance Use Topics   Alcohol use: Never   Drug use: Never     Allergies   Acetaminophen-codeine, Bee venom, Meloxicam, Tramadol, Codeine, Coconut (cocos nucifera), and Tomato   Review of Systems Review of Systems   Physical Exam Triage Vital Signs ED Triage Vitals  Enc Vitals Group     BP 12/25/22 1411 114/78     Pulse Rate 12/25/22 1411 64     Resp 12/25/22 1411 14     Temp 12/25/22 1411 98.2 F (36.8 C)     Temp Source 12/25/22 1411 Oral     SpO2 12/25/22 1411 98 %     Weight --      Height --      Head Circumference --       Peak Flow --      Pain Score 12/25/22 1410 0     Pain Loc --      Pain Edu? --      Excl. in GC? --    No data found.  Updated Vital Signs BP 114/78 (BP Location: Left Arm)   Pulse 64   Temp 98.2 F (36.8 C) (Oral)   Resp 14   SpO2 98%   Visual Acuity Right Eye Distance:   Left Eye Distance:   Bilateral Distance:    Right Eye Near:   Left Eye Near:    Bilateral Near:     Physical Exam Constitutional:      Appearance: Normal appearance.  Eyes:     Extraocular Movements: Extraocular movements intact.  Cardiovascular:     Rate and Rhythm: Normal rate and regular rhythm.     Pulses: Normal pulses.     Heart sounds: Normal heart sounds.  Pulmonary:     Effort: Pulmonary effort is normal.     Breath sounds: Normal breath sounds.  Musculoskeletal:     Comments: Nonpitting bilateral lower extremity edema  Neurological:     Mental Status: She is alert and oriented to person, place, and time. Mental status is at baseline.      UC Treatments / Results  Labs (all labs ordered are listed, but only abnormal results are displayed) Labs Reviewed - No data to display  EKG   Radiology US AXILLA RIGHT  Result Date: 12/24/2022 CLINICAL DATA:  Patient returns after screening study for evaluation of possible bilateral axillary masses. EXAM: ULTRASOUND OF THE RIGHT AXILLA ULTRASOUND OF THE LEFT AXILLA COMPARISON:  12/06/2022 and multiple earlier studies including 2021 FINDINGS: RIGHT axilla: Ultrasound is performed, showing lymph nodes with normal morphology in the RIGHT axilla. Cortical thickness of the visualized lymph nodes measures up to 3.7 millimeters. LEFT axilla: Ultrasound performed portal lymph nodes with normal morphology in the LEFT axilla.  Cortical thickness measures up to 3.3 millimeters. No asymmetric lymph nodes are identified. IMPRESSION: No suspicious axillary nodes. RECOMMENDATION: Screening mammogram in one year.(Code:SM-B-01Y) I have discussed the findings  and recommendations with the patient. If applicable, a reminder letter will be sent to the patient regarding the next appointment. BI-RADS CATEGORY  2: Benign. Electronically Signed   By: Norva Pavlov M.D.   On: 12/24/2022 13:11  [name] AXILLA LEFT  Result Date: 12/24/2022 CLINICAL DATA:  Patient returns after screening study for evaluation of possible bilateral axillary masses. EXAM: ULTRASOUND OF THE RIGHT AXILLA ULTRASOUND OF THE LEFT AXILLA COMPARISON:  12/06/2022 and multiple earlier studies including 2021 FINDINGS: RIGHT axilla: Ultrasound is performed, showing lymph nodes with normal morphology in the RIGHT axilla. Cortical thickness of the visualized lymph nodes measures up to 3.7 millimeters. LEFT axilla: Ultrasound performed portal lymph nodes with normal morphology in the LEFT axilla. Cortical thickness measures up to 3.3 millimeters. No asymmetric lymph nodes are identified. IMPRESSION: No suspicious axillary nodes. RECOMMENDATION: Screening mammogram in one year.(Code:SM-B-01Y) I have discussed the findings and recommendations with the patient. If applicable, a reminder letter will be sent to the patient regarding the next appointment. BI-RADS CATEGORY  2: Benign. Electronically Signed   By: Norva Pavlov M.D.   On: 12/24/2022 13:11   Procedures Procedures (including critical care time)  Medications Ordered in UC Medications - No data to display  Initial Impression / Assessment and Plan / UC Course  I have reviewed the triage vital signs and the nursing notes.  Pertinent labs & imaging results that were available during my care of the patient were reviewed by me and considered in my medical decision making (see chart for details).  Bilateral lower extremity edema  Vital signs are stable patient is in no signs of distress nor toxic appearing, well swelling is present on exam that is nonpitting, lungs are clear to auscultation O2 saturation 98% on room air, chest x-ray is negative,  discussed all findings with this patient, stable at this time, recommended continued extra dosing of Bumex daily until back at baseline weight, 10 additional tablets sent to pharmacy, advise follow-up with cardiology as needed, for worsening symptoms she is to go to the nearest emergency department for immediate evaluation Final Clinical Impressions(s) / UC Diagnoses   Final diagnoses:  None   Discharge Instructions   None    ED Prescriptions   None    PDMP not reviewed this encounter.   Valinda Hoar, NP 12/25/22 1550

## 2022-12-26 ENCOUNTER — Ambulatory Visit (INDEPENDENT_AMBULATORY_CARE_PROVIDER_SITE_OTHER): Payer: 59 | Admitting: Licensed Clinical Social Worker

## 2022-12-26 DIAGNOSIS — F4310 Post-traumatic stress disorder, unspecified: Secondary | ICD-10-CM

## 2022-12-26 DIAGNOSIS — F331 Major depressive disorder, recurrent, moderate: Secondary | ICD-10-CM

## 2022-12-26 DIAGNOSIS — F411 Generalized anxiety disorder: Secondary | ICD-10-CM

## 2022-12-26 NOTE — Progress Notes (Signed)
Virtual Visit via Video Note   I connected with Meghan [name] on 12/26/22 at 3:00pm by video enabled telemedicine application and verified that I am speaking with the correct person using two identifiers.   I discussed the limitations, risks, security and privacy concerns of performing an evaluation and management service by video and the availability of in person appointments. I also discussed with the patient that there may be a patient responsible charge related to this service. The patient expressed understanding and agreed to proceed.   I discussed the assessment and treatment plan with the patient. The patient was provided an opportunity to ask questions and all were answered. The patient agreed with the plan and demonstrated an understanding of the instructions.   The patient was advised to call back or seek an in-person evaluation if the symptoms worsen or if the condition fails to improve as anticipated.   I provided 1 hour of non-face-to-face time during this encounter.     Noralee Stain, LCSW, LCAS _______________________ THERAPIST PROGRESS NOTE   Session Time:  3:00pm - 4:00pm   Location: Patient: Patient Home Provider: [facility]   Participation Level: Active    Behavioral Response: Alert, casually dressed, depressed mood/affect   Type of Therapy:  Individual Therapy   Treatment Goals addressed: Mood management; Medication management; Safety planning   Progress Towards Goals: Progressing    Interventions: CBT: psychoeducation on self-care, discharge planning   Summary: Meghan [name] is a 57 year old divorced African American female that presented for therapy appointment today with diagnoses of Major Depressive Disorder, recurrent, moderate, Generalized Anxiety Disorder and PTSD.      Suicidal/Homicidal: None; without plan or intent                                                                                                                Therapist Response:   Clinician met with Meghan [name] for virtual therapy appointment and assessed for safety, sobriety, and medication compliance.  Meghan [name] presented for session on time and was alert, oriented x5, with no evidence or self-report of active SI/HI or A/V H.  Meghan [name] reported ongoing compliance with medication and denied any use of alcohol or illicit substances.  Clinician inquired about Meghan [name]'s current emotional ratings, as well as any significant changes in thoughts, feelings, or behavior since previous check-in.  Meghan [name] reported scores of 6/10 for depression, 0/10 for anxiety, and 0/10 for anger/irritability.  Meghan [name] denied any recent panic attacks or outbursts.  Meghan [name] reported that a recent struggle has been feeling more depressed, stating "I think it started on Sunday.  It was a taxing weekend".  Clinician inquired about recent triggers that Meghan [name] experienced which could explain this mood shift, in addition to coping skills that she may have utilized.  Meghan [name] reported that several triggers influenced her mood, including missing church, experiencing rainy weather, doing a lot of walking with her sister in [place] on Sunday, and feeling physically unwell.  Meghan [name] reported that she has been trying to take time to rest,  and focusing on completing her book.  Clinician discussed additional self-care activities with Meghan [name] that could be included in her schedule this week to improve mood, with examples such as getting enough sleep, following a healthy diet, minimizing stress, setting aside time for hobbies, doing comforting things, attending preventative medical appointments, and returning to church for spiritual support.  Intervention was effective, as evidenced by Maryl expressing receptiveness to these suggestions, reporting that she plans to modify her schedule accordingly in order to reduce overall depression, stating "Its a new me, a new attitude". Clinician also informed Meghan [name] that she will be discharged from this provider's care  after 01/18/23 due to employment ending with [employer].  Clinician offered to assist Meghan [name] in connecting with a new therapist in order to continue work upon present treatment goals and ensure continuation of care.  Clinician also offered resources that could be useful during Meghan [name]'s transition, including free group support through local agencies such as [employer].  Clinician encouraged Meghan [name] to continue following safety plan, including outreaching 911, [phone], and/or visiting local hospital for assessment should SI/HI arise and pose risk of harm to self and/or others.  Meghan [name] expressed understanding and reported that since she is already attending support groups at [name], she may link with a therapist there, but will also consider looking online at [employer] for options.  She agreed to continue following safety plan and inform clinician of any needs that arise before discharge date.  Clinician will continue to monitor.            Plan: Follow up again in 2 weeks.    Diagnosis: Major depressive disorder, recurrent, moderate; Generalized Anxiety Disorder; and PTSD   Collaboration of Care:   No collaboration of care required for this visit.                                                   Patient/Guardian was advised Release of Information must be obtained prior to any record release in order to collaborate their care with an outside provider. Patient/Guardian was advised if they have not already done so to contact the registration department to sign all necessary forms in order for [name] to release information regarding their care.    Consent: Patient/Guardian gives verbal consent for treatment and assignment of benefits for services provided during this visit. Patient/Guardian expressed understanding and agreed to proceed.  Noralee Stain, LCSW, LCAS 12/26/22

## 2022-12-31 ENCOUNTER — Ambulatory Visit (INDEPENDENT_AMBULATORY_CARE_PROVIDER_SITE_OTHER): Payer: 59 | Admitting: Primary Care

## 2022-12-31 ENCOUNTER — Emergency Department (HOSPITAL_COMMUNITY)
Admission: EM | Admit: 2022-12-31 | Discharge: 2022-12-31 | Disposition: A | Discharge status: Home / Self Care | Payer: 59 | Attending: Emergency Medicine | Admitting: Emergency Medicine

## 2022-12-31 ENCOUNTER — Other Ambulatory Visit: Payer: Self-pay

## 2022-12-31 ENCOUNTER — Encounter: Payer: Self-pay | Admitting: Primary Care

## 2022-12-31 ENCOUNTER — Emergency Department (HOSPITAL_COMMUNITY): Payer: 59

## 2022-12-31 VITALS — BP 100/62 | HR 66 | Ht 65.0 in | Wt 319.4 lb

## 2022-12-31 DIAGNOSIS — J45909 Unspecified asthma, uncomplicated: Secondary | ICD-10-CM

## 2022-12-31 DIAGNOSIS — R569 Unspecified convulsions: Secondary | ICD-10-CM | POA: Insufficient documentation

## 2022-12-31 DIAGNOSIS — Z7901 Long term (current) use of anticoagulants: Secondary | ICD-10-CM | POA: Diagnosis not present

## 2022-12-31 DIAGNOSIS — G4700 Insomnia, unspecified: Secondary | ICD-10-CM

## 2022-12-31 DIAGNOSIS — I4891 Unspecified atrial fibrillation: Secondary | ICD-10-CM | POA: Insufficient documentation

## 2022-12-31 DIAGNOSIS — G4733 Obstructive sleep apnea (adult) (pediatric): Secondary | ICD-10-CM | POA: Diagnosis not present

## 2022-12-31 DIAGNOSIS — Z7984 Long term (current) use of oral hypoglycemic drugs: Secondary | ICD-10-CM | POA: Insufficient documentation

## 2022-12-31 LAB — URINALYSIS, ROUTINE W REFLEX MICROSCOPIC
Bilirubin Urine: NEGATIVE
Glucose, UA: NEGATIVE mg/dL
Hgb urine dipstick: NEGATIVE
Ketones, ur: NEGATIVE mg/dL
Leukocytes,Ua: NEGATIVE
Nitrite: NEGATIVE
Protein, ur: NEGATIVE mg/dL
Specific Gravity, Urine: 1.019 (ref 1.005–1.030)
pH: 5 (ref 5.0–8.0)

## 2022-12-31 LAB — BASIC METABOLIC PANEL
Anion gap: 15 (ref 5–15)
BUN: 21 mg/dL — ABNORMAL HIGH (ref 6–20)
CO2: 27 mmol/L (ref 22–32)
Calcium: 10.6 mg/dL — ABNORMAL HIGH (ref 8.9–10.3)
Chloride: 101 mmol/L (ref 98–111)
Creatinine, Ser: 0.86 mg/dL (ref 0.44–1.00)
GFR, Estimated: >60 mL/min (ref >60)
Glucose, Bld: 96 mg/dL (ref 70–99)
Potassium: 4.1 mmol/L (ref 3.5–5.1)
Sodium: 143 mmol/L (ref 135–145)

## 2022-12-31 LAB — CBC
HCT: 35.2 % — ABNORMAL LOW (ref 36.0–46.0)
Hemoglobin: 11 g/dL — ABNORMAL LOW (ref 12.0–15.0)
MCH: 26.9 pg (ref 26.0–34.0)
MCHC: 31.3 g/dL (ref 30.0–36.0)
MCV: 86.1 fL (ref 80.0–100.0)
Platelets: 245 10*3/uL (ref 150–400)
RBC: 4.09 MIL/uL (ref 3.87–5.11)
RDW: 14.7 % (ref 11.5–15.5)
WBC: 6.6 10*3/uL (ref 4.0–10.5)
nRBC: 0 % (ref 0.0–0.2)

## 2022-12-31 LAB — MAGNESIUM: Magnesium: 2 mg/dL (ref 1.7–2.4)

## 2022-12-31 MED ORDER — LEVETIRACETAM 500 MG PO TABS: 500.0000 mg | ORAL_TABLET | Freq: Two times a day (BID) | ORAL | 0 refills | Status: DC

## 2022-12-31 NOTE — ED Triage Notes (Addendum)
Pt BIB EMS after having witnessed 1 min long seizure. Hx Seizures, was taken off Keppra 1 yr ago

## 2022-12-31 NOTE — Assessment & Plan Note (Addendum)
-   Stable; Well controlled on [employer]. No recent SABA use.  - Managed by primary care

## 2022-12-31 NOTE — Progress Notes (Signed)
  ID: Meghan [name], female    DOB: 01-Feb-1966, 57 y.o.   MRN: [id number]  Chief Complaint  Patient presents with   Follow-up    Referring provider: Felix Pacini, *  HPI: 57 year old female, never smoked.  Past medical history significant for asthma, obstructive sleep apnea, A-fib, obesity.  Previous [facility] pulmonry encounter: 11/16/2021 Patient presents today for sleep consult. She is here to establish as a new patient. She has a history of sleep apnea, currently on CPAP. She wears CPAP every night. She is unsure setting. Over the last two months she has been consistently waking up at 2-3am and reports having trouble falling back asleep. She goes to bed around 9-10pm and starts her day between 7:30-8am. She has tried several different types of melatonin without improvement. She did not want to take diphenhydramine d.t kidneys. Her current resmed CPAP machine is working well. She wears size large full face mask. No large airleaks that she is aware of. She recently received new supplies. DME company is [employer].   Sleep questionnaire Symptoms- Snoring, restless sleep, daytime sleepiness      Prior sleep study- In 2020 ( we do not have copy) Bedtime- 9-10pm  Time to fall asleep- 15-20 mins Nocturnal awakenings- once around 2-3am  Out of bed/start of day- 7:30-8am  Weight changes- down 48lbs Do you operate heavy machinery- No Do you currently wear CPAP- Yes Do you current wear oxygen- Yes [place]- 16   05/24/2022  Patient returns today for sleep follow-up.  She continues to have symptoms of restless sleep.  She wakes up on average every 90 minutes and has trouble falling back to sleep.  She has tried melatonin and ZzzQuil without improvement.  Typical bedtime is 10 PM.  She starts her day at 7:30 AM.  She reports compliance with CPAP however we only have a download from September 2022 to October 2022 she does not have an SD card in her machine.  Previous CPAP pressure was 13 cm  H2O and she had a residual AHI of 1.7.  She had large amount of air leaks.  She tells me that she has received a new CPAP facemask.  Her DME company is [employer].  We received a copy of her sleep study results from May 16, 2019 that showed evidence of severe obstructive sleep apnea, overall AHI was 32.1 an hour with desaturations to 62%.  She had severe snoring.  Optimal CPAP pressure at that time was 9 cm H2O.  Obstructive sleep apnea syndrome - Sleep study on 05/16/2019 showed evidence of severe obstructive sleep apnea, overall AHI 32.1 an hour.  Patient is maintained on CPAP, previous pressure settings were 13 cm H2O.  She reports compliance with use however [employer] download is not up-to-date and she does not have an SD card.  She continues to have interrupted fragmented sleep.  Recommend CPAP titration study in lab. FU in 4-6 weeks.    Insomnia -  She has difficulty maintaining sleep.  She will have a hard time falling back to sleep once she is awake.  She has tried melatonin and ZzzQuil without improvement.  Sending in Rx for Atarax 25 to 50 mg at bedtime for insomnia.     07/02/2022 Patient presents today for 4-6 week follow-up. She had a sleep study in August 2020 that showed severe OSA, overall AHI was 32.1 an hour with desaturations to 62%. Previous CPAP pressure 13cm h20. Experiencing large amt of air leaks. No compliance report available. She  reports continued disrupted/fragmented sleep. Started her on Atarax 25-50mg  at bedtime for insomnia. CPAP titration on 06/15/22 showed optimal pressure 11cm h20 with medium airfit N20 full face mask  She is currently taking pain medication for foot  Sleeping a little better with new mask. Atarax helped.  Everything else is ok  DME [employer] is where she gets her supplies from, received machine from [facility]   12/31/2022 Patient presents today for 6 month follow-up. Hx severe sleep apnea, sleep study in August 2020 overall AHI was 32.1 an hour with  desaturations to 62%. CPAP titration on 06/15/22 showed optimal pressure 11cm h20 with medium airfit N20 full face mask. She gets her medical supplies from [employer], received her machine from [facility]. She has a dream station CPAP machine. [employer] will be faxing compliance report.   She reports wearing CPAP every night except last night. She takes machine with her when she travel. She is using full face mask. No issues with mask fit or pressure settings. No significant airleaks. She is sleeping a lot better, moved apartments which has helped. She will occasionally take Atarax 25mg  but hasn't needed in the last month. Her psychiatrist changed you to doxepin. Her asthma symptoms are stable.  She is using Breztri twice daily as prescribed.  No recent need for albuterol.   Allergies  Allergen Reactions   Acetaminophen-Codeine Swelling, Rash and Other (See Comments)    Tylenol with Codeine, Tylenol #3,  (facial swelling, hives)   Bee Venom Anaphylaxis   Meloxicam Anaphylaxis and Rash   Tramadol Hives and Other (See Comments)    Patient stated "I was trippin' and I do not want that ever again"   Codeine Swelling    hives   Coconut (Cocos Nucifera) Rash and Other (See Comments)    ANY coconut products    Tomato Rash    Fresh     Immunization History  Administered Date(s) Administered   Influenza Split 05/22/2019, 07/18/2021   Influenza, High Dose Seasonal PF 05/22/2019   Influenza-Unspecified 05/28/2019   PFIZER(Purple Top)SARS-COV-2 Vaccination 12/18/2019, 01/18/2020    Past Medical History:  Diagnosis Date   Anticoagulated    xarelto--- managed by cardiology   Anxiety    Arthritis    left knee   Asthma    Atrial fibrillation 02/16/2019   CHF (congestive heart failure) 01/2019   Physician from [place] informed her she had this    Diabetes mellitus without complication    Dysrhythmia    Atrial fibrillation   GERD (gastroesophageal reflux disease)    Hiatal hernia    History of pulmonary  embolus (PE)    Hypercholesterolemia    Intractable nausea and vomiting 11/16/2020   Macular degeneration    of left eye   MDD (major depressive disorder)    Osteoarthritis    knees   Pneumonia    in the past   PTSD (post-traumatic stress disorder)    Pulmonary embolism    bilateral, none since 2003   Seizure disorder    Per patient last seizure was in 2020.   Sleep apnea CPAP   [facility]  Uses CPAP   Stroke    1989 and 1995 (left sided weakness)   Vitamin D deficiency     Tobacco History: Social History   Tobacco Use  Smoking Status Never  Smokeless Tobacco Never   Counseling given: Not Answered   Outpatient Medications Prior to Visit  Medication Sig Dispense Refill   acetaminophen (TYLENOL) 500 MG tablet Take 1,000 mg  by mouth every 6 (six) hours as needed for moderate pain.     albuterol (VENTOLIN HFA) 108 (90 Base) MCG/ACT inhaler Inhale 2 puffs into the lungs every 6 (six) hours as needed for wheezing or shortness of breath.     atorvastatin (LIPITOR) 40 MG tablet Take 40 mg by mouth every evening.     BREZTRI AEROSPHERE 160-9-4.8 MCG/ACT AERO Inhale 2 puffs into the lungs in the morning and at bedtime.      bumetanide (BUMEX) 1 MG tablet Take 1 tablet (1 mg total) by mouth daily for 10 days. 10 tablet 0   Cholecalciferol (VITAMIN D-3) 125 MCG (5000 UT) TABS Take 5,000 Units by mouth daily.     flecainide (TAMBOCOR) 50 MG tablet Take 50 mg by mouth 2 (two) times daily.      Menthol-Camphor (TIGER BALM ARTHRITIS RUB) 11-11 % CREA Apply 1 application  topically daily as needed (Knee pain).     metFORMIN (GLUCOPHAGE) 500 MG tablet Take 500 mg by mouth daily with breakfast.     metoprolol succinate (TOPROL-XL) 25 MG 24 hr tablet Take 1 tablet (25 mg total) by mouth daily. Take with or immediately following a meal. 90 tablet 3   Omega-3 Fatty Acids (FISH OIL) 1200 MG CAPS Take 1,200 mg by mouth daily with breakfast.     omeprazole (PRILOSEC) 40 MG capsule Take 40 mg by  mouth daily.     rivaroxaban (XARELTO) 20 MG TABS tablet Take 20 mg by mouth at bedtime.      RYBELSUS 14 MG TABS Take 14 mg by mouth in the morning.     sertraline (ZOLOFT) 50 MG tablet Take 50 mg by mouth in the morning.     bumetanide (BUMEX) 1 MG tablet Take 1 mg by mouth in the morning.     No facility-administered medications prior to visit.      Review of Systems  Review of Systems   Physical Exam  BP 100/62 (BP Location: Left Arm, Patient Position: Sitting, Cuff Size: Large)   Pulse 66   Ht 5\' 5"  (1.651 m)   Wt (!) 319 lb 6.4 oz (144.9 kg)   SpO2 96%   BMI 53.15 kg/m  Physical Exam   Lab Results:  CBC    Component Value Date/Time   WBC 4.6 11/08/2022 1134   RBC 3.69 (L) 11/08/2022 1134   HGB 10.2 (L) 11/08/2022 1134   HGB 11.2 10/26/2022 0949   HCT 31.0 (L) 11/08/2022 1134   HCT 34.9 10/26/2022 0949   PLT 223 11/08/2022 1134   PLT 267 10/26/2022 0949   MCV 84.0 11/08/2022 1134   MCV 84 10/26/2022 0949   MCH 27.6 11/08/2022 1134   MCHC 32.9 11/08/2022 1134   RDW 14.5 11/08/2022 1134   RDW 14.2 10/26/2022 0949   LYMPHSABS 1.6 11/07/2022 1717   LYMPHSABS 1.8 10/26/2022 0949   MONOABS 0.7 11/07/2022 1717   EOSABS 0.2 11/07/2022 1717   EOSABS 0.1 10/26/2022 0949   BASOSABS 0.0 11/07/2022 1717   BASOSABS 0.0 10/26/2022 0949    BMET    Component Value Date/Time   NA 139 11/08/2022 1134   NA 144 10/26/2022 0949   K 3.7 11/08/2022 1134   CL 106 11/08/2022 1134   CO2 24 11/08/2022 1134   GLUCOSE 92 11/08/2022 1134   BUN 9 11/08/2022 1134   BUN 15 10/26/2022 0949   CREATININE 0.69 11/08/2022 1134   CALCIUM 9.6 11/08/2022 1134   GFRNONAA >60 11/08/2022 1134  GFRAA >60 05/11/2020 1701    BNP    Component Value Date/Time   BNP 92.4 02/01/2021 1034    ProBNP No results found for: "PROBNP"  Imaging: DG Chest 2 View  Result Date: 12/25/2022 CLINICAL DATA:  Shortness of breath. Leg swelling. Congestive heart failure. EXAM: CHEST - 2 VIEW  COMPARISON:  Chest radiographs 01/09/2022, 01/11/2022, 12/05/2021 FINDINGS: Cardiac silhouette is again mildly enlarged. Mediastinal contours are within normal limits. Lateral left mid to lower lung horizontal linear subsegmental atelectasis contacting the lateral pleura. Bilateral lower lung interstitial markings appear unchanged from multiple prior radiographs and chronic. No acute airspace opacity to indicate pneumonia. No pleural effusion or pneumothorax. Mild multilevel degenerative disc changes of the thoracic spine. Upper abdominal likely cholecystectomy clips. IMPRESSION: 1. No acute cardiopulmonary disease. 2. Mild cardiomegaly. 3. Chronic bilateral lower lung interstitial thickening. Electronically Signed   By: Neita Garnet M.D.   On: 12/25/2022 15:05   [name] AXILLA RIGHT  Result Date: 12/24/2022 CLINICAL DATA:  Patient returns after screening study for evaluation of possible bilateral axillary masses. EXAM: ULTRASOUND OF THE RIGHT AXILLA ULTRASOUND OF THE LEFT AXILLA COMPARISON:  12/06/2022 and multiple earlier studies including 2021 FINDINGS: RIGHT axilla: Ultrasound is performed, showing lymph nodes with normal morphology in the RIGHT axilla. Cortical thickness of the visualized lymph nodes measures up to 3.7 millimeters. LEFT axilla: Ultrasound performed portal lymph nodes with normal morphology in the LEFT axilla. Cortical thickness measures up to 3.3 millimeters. No asymmetric lymph nodes are identified. IMPRESSION: No suspicious axillary nodes. RECOMMENDATION: Screening mammogram in one year.(Code:SM-B-01Y) I have discussed the findings and recommendations with the patient. If applicable, a reminder letter will be sent to the patient regarding the next appointment. BI-RADS CATEGORY  2: Benign. Electronically Signed   By: Norva Pavlov M.D.   On: 12/24/2022 13:11  [name] AXILLA LEFT  Result Date: 12/24/2022 CLINICAL DATA:  Patient returns after screening study for evaluation of possible bilateral  axillary masses. EXAM: ULTRASOUND OF THE RIGHT AXILLA ULTRASOUND OF THE LEFT AXILLA COMPARISON:  12/06/2022 and multiple earlier studies including 2021 FINDINGS: RIGHT axilla: Ultrasound is performed, showing lymph nodes with normal morphology in the RIGHT axilla. Cortical thickness of the visualized lymph nodes measures up to 3.7 millimeters. LEFT axilla: Ultrasound performed portal lymph nodes with normal morphology in the LEFT axilla. Cortical thickness measures up to 3.3 millimeters. No asymmetric lymph nodes are identified. IMPRESSION: No suspicious axillary nodes. RECOMMENDATION: Screening mammogram in one year.(Code:SM-B-01Y) I have discussed the findings and recommendations with the patient. If applicable, a reminder letter will be sent to the patient regarding the next appointment. BI-RADS CATEGORY  2: Benign. Electronically Signed   By: Norva Pavlov M.D.   On: 12/24/2022 13:11  MM 3D SCREENING MAMMOGRAM BILATERAL BREAST  Result Date: 12/07/2022 CLINICAL DATA:  Screening. EXAM: DIGITAL SCREENING BILATERAL MAMMOGRAM WITH TOMOSYNTHESIS AND CAD TECHNIQUE: Bilateral screening digital craniocaudal and mediolateral oblique mammograms were obtained. Bilateral screening digital breast tomosynthesis was performed. The images were evaluated with computer-aided detection. COMPARISON:  Previous exam(s). ACR Breast Density Category a: The breasts are almost entirely fatty. FINDINGS: In the bilateral axilla, a possible mass warrants further evaluation. In the bilateral breast, no findings suspicious for malignancy. IMPRESSION: Further evaluation is suggested for possible mass in the bilateral axilla. RECOMMENDATION: Ultrasound of the bilateral axilla.  (Code:US-B-60M) The patient will be contacted regarding the findings, and additional imaging will be scheduled. BI-RADS CATEGORY  0: Incomplete: Need additional imaging evaluation. Electronically Signed   By: Casimer Bilis  Marisa Sprinkles M.D.   On: 12/07/2022 11:16      Assessment & Plan:   Obstructive sleep apnea syndrome - Hx severe sleep apnea, sleep study in August 2020 overall AHI was 32.1 an hour with desaturations to 62%. CPAP titration on 06/15/22 showed optimal pressure 11cm h20 with medium airfit N20 full face mask. - Patient reports compliance with CPAP nightly. No issues with pressure settings or mask fit. Current pressure settings 11cm h20. [employer] will be faxing compliance report. Advised patient continue to wear CPAP every night for min 4-6 hours or longer.  - No changes today, FU in 1 year or sooner   Asthma - Stable; Well controlled on [employer]. No recent SABA use.  - Managed by primary care   Insomnia - Sleeping better. She did well on Atarax 25mg  qhs. Following with psychiatry.      Glenford Bayley, NP 12/31/2022

## 2022-12-31 NOTE — Progress Notes (Signed)
Reviewed and agree with assessment/plan.   Coralyn Helling, MD [facility] Pulmonary/Critical Care 12/31/2022, 9:22 AM Pager:  [phone]

## 2022-12-31 NOTE — ED Provider Notes (Signed)
[place] EMERGENCY DEPARTMENT AT [facility] Provider Note   CSN: [id number] Arrival date & time: 12/31/22  1248     History  Chief Complaint  Patient presents with   Seizures    Meghan [name] is a 57 y.o. 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  EMS provides supplemental history, reporting the patient was noted to have approximately 1 minute possible seizure type episode while at work.  The patient recalls only strange prodromal symptoms like she was "swimming underwater" which she says is often a symptom she has before her "seizures" and she does not recall what happened afterwards.  EMS reported the patient initially was having staring spells, staring off into space.  No tonic-clonic jerking reported.  Patient is currently reporting general headache  Last seen by neurology in Oct 2022, Dr Levert Feinstein from [facility], and per my review of this office record, the patient is noted to have normal EEG testing, and a lifelong history of "seizure-like activity" for which she was treated with Dilantin for many years and then switched to Keppra at age 69, which she was maintained on for many years. She has had MRI imaging of the brain in the past that showed no abnormalities.  Her neurologist reports that she had plan to keep her on Keppra 500 mg twice daily at that time per the office evaluation.  Is not clear who discontinued the Keppra medication.  HPI     Home Medications Prior to Admission medications   Medication Sig Start Date End Date Taking? Authorizing Provider  levETIRAcetam (KEPPRA) 500 MG tablet Take 1 tablet (500 mg total) by mouth 2 (two) times daily. 12/31/22 01/30/23 Yes Danyah Guastella, Kermit Balo, MD  acetaminophen (TYLENOL) 500 MG tablet Take 1,000 mg by mouth every 6 (six) hours as needed for moderate pain.    [provider]   albuterol (VENTOLIN HFA) 108 (90 Base) MCG/ACT inhaler Inhale 2 puffs into the lungs every 6 (six) hours as needed for wheezing or shortness of breath.    [provider]  atorvastatin (LIPITOR) 40 MG tablet Take 40 mg by mouth every evening. 07/26/21   [provider]  BREZTRI AEROSPHERE 160-9-4.8 MCG/ACT AERO Inhale 2 puffs into the lungs in the morning and at bedtime.  03/24/20   [provider]  bumetanide (BUMEX) 1 MG tablet Take 1 tablet (1 mg total) by mouth daily for 10 days. 12/25/22 01/04/23  Valinda Hoar, NP  Cholecalciferol (VITAMIN D-3) 125 MCG (5000 UT) TABS Take 5,000 Units by mouth daily.    [provider]  flecainide (TAMBOCOR) 50 MG tablet Take 50 mg by mouth 2 (two) times daily.  09/04/19   [provider]  Menthol-Camphor (TIGER BALM ARTHRITIS RUB) 11-11 % CREA Apply 1 application  topically daily as needed (Knee pain).    [provider]  metFORMIN (GLUCOPHAGE) 500 MG tablet Take 500 mg by mouth daily with breakfast.    [provider]  metoprolol succinate (TOPROL-XL) 25 MG 24 hr tablet Take 1 tablet (25 mg total) by mouth daily. Take with or immediately following a meal. 07/19/22   Maisie Fus, MD  Omega-3 Fatty Acids (FISH OIL) 1200 MG CAPS Take 1,200 mg by mouth daily with breakfast.    [provider]  omeprazole (PRILOSEC) 40 MG capsule Take 40 mg by mouth daily. 08/17/20   [provider]  rivaroxaban (XARELTO) 20 MG TABS tablet Take 20 mg by mouth at bedtime.     [provider]  RYBELSUS 14 MG TABS Take 14 mg by mouth in the morning. 07/27/21   [provider]  sertraline (ZOLOFT) 50 MG tablet Take 50 mg by mouth in the morning. 01/01/21   [provider]      Allergies    Acetaminophen-codeine, Bee venom, Meloxicam, Tramadol, Codeine, Coconut (cocos nucifera), and Tomato    Review of Systems   Review of Systems  Physical Exam Updated Vital Signs BP  128/66 (BP Location: Right Arm)   Pulse 62   Temp 98.6 F (37 C) (Oral)   Resp 16   Ht  (1.651 m)   Wt (!) 144 kg   SpO2 98%   BMI 52.83 kg/m  Physical Exam Constitutional:      General: She is not in acute distress.    Appearance: She is obese.  HENT:     Head: Normocephalic and atraumatic.  Eyes:     Conjunctiva/sclera: Conjunctivae normal.     Pupils: Pupils are equal, round, and reactive to light.  Cardiovascular:     Rate and Rhythm: Normal rate and regular rhythm.  Pulmonary:     Effort: Pulmonary effort is normal. No respiratory distress.  Abdominal:     General: There is no distension.     Tenderness: There is no abdominal tenderness.  Skin:    General: Skin is warm and dry.  Neurological:     General: No focal deficit present.     Mental Status: She is alert and oriented to person, place, and time. Mental status is at baseline.     Cranial Nerves: No cranial nerve deficit.     Sensory: No sensory deficit.     Motor: No weakness.  Psychiatric:        Mood and Affect: Mood normal.        Behavior: Behavior normal.     ED Results / Procedures / Treatments   Labs (all labs ordered are listed, but only abnormal results are displayed) Labs Reviewed  BASIC METABOLIC PANEL - Abnormal; Notable for the following components:      Result Value   BUN 21 (*)    Calcium 10.6 (*)    All other components within normal limits  CBC - Abnormal; Notable for the following components:   Hemoglobin 11.0 (*)    HCT 35.2 (*)    All other components within normal limits  URINALYSIS, ROUTINE W REFLEX MICROSCOPIC - Abnormal; Notable for the following components:   APPearance HAZY (*)    All other components within normal limits  MAGNESIUM    EKG None  Radiology CT Head Wo Contrast  Result Date: 12/31/2022 CLINICAL DATA:  Headache, increasing frequency or severity seizure, unclear if head injury, on xarelto, reporting headache EXAM: CT HEAD WITHOUT CONTRAST TECHNIQUE:  Contiguous axial images were obtained from the base of the skull through the vertex without intravenous contrast. RADIATION DOSE REDUCTION: This exam was performed according to the departmental dose-optimization program which includes automated exposure control, adjustment of the mA and/or kV according to patient size and/or use of iterative reconstruction technique. COMPARISON:  CT January 01, 2022. FINDINGS: Brain: No evidence of acute infarction, hemorrhage, hydrocephalus, extra-axial collection or mass lesion/mass effect. Vascular: No hyperdense vessel. Skull: No acute fracture. Sinuses/Orbits: Clear sinuses.  No acute orbital findings. Other: No mastoid effusions. IMPRESSION: No evidence of acute intracranial abnormality. Electronically Signed  By: Feliberto Harts M.D.   On: 12/31/2022 14:36    Procedures Procedures    Medications Ordered in ED Medications - No data to display  ED Course/ Medical Decision Making/ A&P                             Medical Decision Making Amount and/or Complexity of Data Reviewed Labs: ordered. Radiology: ordered.  Risk Prescription drug management.   This patient presents to the ED with concern for seizure-like activity. This involves an extensive number of treatment options, and is a complaint that carries with it a high risk of complications and morbidity.  The differential diagnosis includes seizure or pseudoseizure versus other  Co-morbidities that complicate the patient evaluation: Patient is on Xarelto, at high risk of intracranial injury from traumatic head injury  Additional history obtained from patient's sister at bedside  External records from outside source obtained and reviewed including neurology office evaluation 2022, which reports to plan to maintain the patient on Keppra 500 mg twice daily.  I ordered and personally interpreted labs.  The pertinent results include: No emergent findings  I ordered imaging studies including CT scan  of the head I independently visualized and interpreted imaging which showed no emergent findings I agree with the radiologist interpretation  The patient was maintained on a cardiac monitor.  I personally viewed and interpreted the cardiac monitored which showed an underlying rhythm of: Normal sinus rhythm   I have reviewed the patients home medicines and have made adjustments as needed  Test Considered: Doubt acute PE.   After the interventions noted above, I reevaluated the patient and found that they have: improved  Dispostion:  After consideration of the diagnostic results and the patients response to treatment, I feel that the patent would benefit from outpatient follow-up..  The patient is requesting office information for new neurology group, as she was not satisfied with her prior provider at [facility].  There seems to be some confusion as to whether she was discontinued off of her Keppra medication by her neurologist, but per my review of that office note, it appears that the neurologist had intended to keep her on Keppra.  I will restart the Keppra at this time 500 mg twice daily.  I did explain to the patient and her sister at the bedside that if she is truly having persistent pseudoseizures or nonepileptic seizures, she would not benefit from being on antiseizure medication.  They understand this.         Final Clinical Impression(s) / ED Diagnoses Final diagnoses:  Seizure-like activity    Rx / DC Orders ED Discharge Orders          Ordered    levETIRAcetam (KEPPRA) 500 MG tablet  2 times daily        12/31/22 1552              Terald Sleeper, MD 12/31/22 [phone]

## 2022-12-31 NOTE — Discharge Instructions (Addendum)
Please follow-up with your primary care provider or neurologist about his ongoing seizure-like spells.  It is not clear whether these are epileptic or nonepileptic seizures.  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  Neurologist should be managing this condition.  You requested the office information for a different neurology group for a second opinion, and I provided a number for [facility].  At this time we have decided to restart your seizure medication, but this medicine should be continued by primary care clinic or neurology specialist.

## 2022-12-31 NOTE — Assessment & Plan Note (Addendum)
-   Hx severe sleep apnea, sleep study in August 2020 overall AHI was 32.1 an hour with desaturations to 62%. CPAP titration on 06/15/22 showed optimal pressure 11cm h20 with medium airfit N20 full face mask. - Patient reports compliance with CPAP nightly. No issues with pressure settings or mask fit. Current pressure settings 11cm h20. [employer] will be faxing compliance report. Advised patient continue to wear CPAP every night for min 4-6 hours or longer.  - No changes today, FU in 1 year or sooner

## 2022-12-31 NOTE — Patient Instructions (Signed)
Recommendations Continue to wear CPAP nightly for 4 to 6 hours or longer Breztri Aerosphere 2 puffs twice daily -managed by primary care Discussed changing from doxepin back to Atarax for insomnia with psychiatry   Follow-up 1 year with Waynetta Sandy NP for OSA

## 2022-12-31 NOTE — Assessment & Plan Note (Signed)
-   Sleeping better. She did well on Atarax 25mg  qhs. Following with psychiatry.

## 2023-01-01 ENCOUNTER — Ambulatory Visit (HOSPITAL_COMMUNITY)
Admission: RE | Admit: 2023-01-01 | Discharge: 2023-01-01 | Disposition: A | Discharge status: Home / Self Care | Payer: 59 | Source: Ambulatory Visit | Attending: Gastroenterology | Admitting: Gastroenterology

## 2023-01-01 DIAGNOSIS — R1319 Other dysphagia: Secondary | ICD-10-CM | POA: Insufficient documentation

## 2023-01-10 ENCOUNTER — Encounter (HOSPITAL_COMMUNITY): Payer: Self-pay

## 2023-01-10 ENCOUNTER — Telehealth (HOSPITAL_COMMUNITY): Payer: Self-pay | Admitting: Licensed Clinical Social Worker

## 2023-01-10 ENCOUNTER — Ambulatory Visit (HOSPITAL_COMMUNITY): Payer: 59 | Admitting: Licensed Clinical Social Worker

## 2023-01-10 NOTE — Telephone Encounter (Signed)
Meghan [name] had a virtual therapy session scheduled today at 3pm.  Meghan [name] presented for appointment on time, but was observed riding in a car.  Clinician inquired about where Meghan [name] was, and if confidentiality could be ensured in this setting.  Meghan [name] reported that she was in an [place], and had just got off a plane to visit family in [place], [place].  Clinician informed Meghan [name] that due to this provider only being licensed in [place], the session could not be held today, and would need to be rescheduled.  Meghan [name] reported that she understood, and would followup next week at session scheduled for Wednesday. Meghan [name] reported that she would be back in [place] by then.  Clinician informed front desk staff of cancellation, and will continue to monitor.    Meghan [name], [place], LCAS 01/10/23

## 2023-01-16 ENCOUNTER — Ambulatory Visit (INDEPENDENT_AMBULATORY_CARE_PROVIDER_SITE_OTHER): Payer: 59 | Admitting: Licensed Clinical Social Worker

## 2023-01-16 DIAGNOSIS — F411 Generalized anxiety disorder: Secondary | ICD-10-CM

## 2023-01-16 DIAGNOSIS — F331 Major depressive disorder, recurrent, moderate: Secondary | ICD-10-CM

## 2023-01-16 DIAGNOSIS — F4310 Post-traumatic stress disorder, unspecified: Secondary | ICD-10-CM

## 2023-01-16 NOTE — Progress Notes (Signed)
Virtual Visit via Video Note   I connected with Elyce S. Koepp on 01/16/23 at 3:10pm by video enabled telemedicine application and verified that I am speaking with the correct person using two identifiers.   I discussed the limitations, risks, security and privacy concerns of performing an evaluation and management service by video and the availability of in person appointments. I also discussed with the patient that there may be a patient responsible charge related to this service. The patient expressed understanding and agreed to proceed.   I discussed the assessment and treatment plan with the patient. The patient was provided an opportunity to ask questions and all were answered. The patient agreed with the plan and demonstrated an understanding of the instructions.   The patient was advised to call back or seek an in-person evaluation if the symptoms worsen or if the condition fails to improve as anticipated.   I provided 33 minutes of non-face-to-face time during this encounter.     Noralee Stain, LCSW, LCAS _______________________ THERAPIST PROGRESS NOTE   Session Time:  3:10pm - 3:43pm    Location: Patient: Patient Home Provider: [facility]   Participation Level: Active    Behavioral Response: Alert, casually dressed, euthymic mood/affect   Type of Therapy:  Individual Therapy   Treatment Goals addressed: Mood management; Medication management; Maintaining healthier boundaries with network   Progress Towards Goals: Progressing    Interventions: CBT: psychoeducation on healthy boundaries    Summary: Calle S. Voigt is a 57 year old divorced African American female that presented for therapy appointment today with diagnoses of Major Depressive Disorder, recurrent, moderate, Generalized Anxiety Disorder and PTSD.      Suicidal/Homicidal: None; without plan or intent                                                                                                                 Therapist Response:  Clinician met with Takiera for virtual therapy session and assessed for safety, sobriety, and medication compliance.  Lashauna presented for appointment 10 minutes late, reporting that she had got her days mixed up.  She was alert, oriented x5, with no evidence or self-report of active SI/HI or A/V H.  Illyria reported ongoing compliance with medication and denied any use of alcohol or illicit substances.  Clinician inquired about Allanna's emotional ratings today, as well as any significant changes in thoughts, feelings, or behavior since last check-in.  Maricia reported scores of 0/10 for depression, 0/10 for anxiety, and 0/10 for anger/irritability.  Giulianna denied any recent panic attacks or outbursts.  Tashay reported that a recent success was going back to [place] to visit family, stating "I enjoyed my time, but by Sunday I was ready to go home".  Jeff reported that she was ready to go because too many people wanted her time and attention, which was overwhelming at times, stating "Its like everybody was pulling me this way and that".  Clinician reviewed psychoeducation on subject of boundaries with Solstice today using a handout.  This  handout defined boundaries as the limits and rules that we set for ourselves within relationships, and featured a breakdown of the 3 common categories of boundaries (i.e. porous, rigid, and healthy), along with typical traits specific to each one for easy identification.  It was noted that most people have a mixture of different boundary types depending on setting, person, and culture.  Clinician tasked Luster Landsberg with identifying which types of boundaries she presently has within her own support system, the collective impact these boundaries have upon her mental health, and changes that could be made in order to more effectively communicate her mental health needs.  Intervention was effective, as evidenced by Luster Landsberg actively engaging in discussion on subject, noting that in the  past she used to have more porous boundaries with supports, which allowed others to take advantage of her time and attention, negatively impacting her mental health and leading her to move to [place].  Jacey reported that now she values her own opinions, doesn't compromise her values, knows her wants, needs, and how to assertively communicate them. Onnika stated "I know that my friends and family members want to see me because I only come up once a year.  I just tell them point blank, I can spend time with you, but need time to myself too".  Clinician also informed Jenalyn that this provider is no longer leaving [place], and therapy can continue as previously scheduled unless she has chosen to seek other options.  Morgaine reported that this news was relieving and she will plan to continue with therapy.  Clinician will continue to monitor.            Plan: Follow up again in 2 weeks.    Diagnosis: Major depressive disorder, recurrent, moderate; Generalized Anxiety Disorder; and PTSD   Collaboration of Care:   No collaboration of care required for this visit.                                                   Patient/Guardian was advised Release of Information must be obtained prior to any record release in order to collaborate their care with an outside provider. Patient/Guardian was advised if they have not already done so to contact the registration department to sign all necessary forms in order for [name] to release information regarding their care.    Consent: Patient/Guardian gives verbal consent for treatment and assignment of benefits for services provided during this visit. Patient/Guardian expressed understanding and agreed to proceed.  Noralee Stain, LCSW, LCAS 01/16/23

## 2023-01-21 ENCOUNTER — Ambulatory Visit (INDEPENDENT_AMBULATORY_CARE_PROVIDER_SITE_OTHER): Payer: 59 | Admitting: Licensed Clinical Social Worker

## 2023-01-21 DIAGNOSIS — F411 Generalized anxiety disorder: Secondary | ICD-10-CM

## 2023-01-21 DIAGNOSIS — F331 Major depressive disorder, recurrent, moderate: Secondary | ICD-10-CM

## 2023-01-21 DIAGNOSIS — F4310 Post-traumatic stress disorder, unspecified: Secondary | ICD-10-CM
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
## 2023-01-28 ENCOUNTER — Ambulatory Visit (INDEPENDENT_AMBULATORY_CARE_PROVIDER_SITE_OTHER): Payer: 59 | Admitting: Licensed Clinical Social Worker

## 2023-01-28 DIAGNOSIS — F411 Generalized anxiety disorder: Secondary | ICD-10-CM

## 2023-01-28 DIAGNOSIS — F331 Major depressive disorder, recurrent, moderate: Secondary | ICD-10-CM

## 2023-01-28 DIAGNOSIS — F4310 Post-traumatic stress disorder, unspecified: Secondary | ICD-10-CM | POA: Diagnosis not present

## 2023-01-28 NOTE — Progress Notes (Signed)
Virtual Visit via Video Note   I connected with Dylanie S. Carriere on 01/28/23 at 3:00pm by video enabled telemedicine application and verified that I am speaking with the correct person using two identifiers.   I discussed the limitations, risks, security and privacy concerns of performing an evaluation and management service by video and the availability of in person appointments. I also discussed with the patient that there may be a patient responsible charge related to this service. The patient expressed understanding and agreed to proceed.   I discussed the assessment and treatment plan with the patient. The patient was provided an opportunity to ask questions and all were answered. The patient agreed with the plan and demonstrated an understanding of the instructions.   The patient was advised to call back or seek an in-person evaluation if the symptoms worsen or if the condition fails to improve as anticipated.   I provided 30 minutes of non-face-to-face time during this encounter.     Noralee Stain, LCSW, LCAS _______________________ THERAPIST PROGRESS NOTE   Session Time:  3:00pm - 3:30pm   Location: Patient: Patient Home Provider: [facility]   Participation Level: Active    Behavioral Response: Alert, casually dressed, euthymic mood/affect   Type of Therapy:  Individual Therapy   Treatment Goals addressed: Mood management; Medication management; Attending church regularly for support; Improving communication skills   Progress Towards Goals: Progressing    Interventions: CBT, communication skills    Summary: Karyl S. Mannarino is a 57 year old divorced African American female that presented for therapy appointment today with diagnoses of Major Depressive Disorder, recurrent, moderate, Generalized Anxiety Disorder and PTSD.      Suicidal/Homicidal: None; without plan or intent                                                                                                                 Therapist Response:  Clinician met with Aranda for virtual therapy appointment and assessed for safety, sobriety, and medication compliance.  Mackenzi presented for session on time and was alert, oriented x5, with no evidence or self-report of active SI/HI or A/V H.  Jamesia reported ongoing compliance with medication and denied any use of alcohol or illicit substances.  Clinician inquired about Makyiah's current emotional ratings, as well as any significant changes in thoughts, feelings, or behavior since previous check-in.  Breta reported scores of 0/10 for depression, 0/10 for anxiety, and 0/10 for anger/irritability.  Tatym denied any recent panic attacks or outbursts.  Christin reported that a recent success was having a nice Mother's Day, which led her to receive thoughtful gifts and calls from family.  She reported that one struggle was being nominated to do a blessing for a room of 150 women at a church event the other day, which made her very nervous since she isn't used to public speaking on this level, stating "I don't like it".  Clinician covered topic of public speaking with [name] today to assist. Clinician utilized a handout to guide discussion, which  explained how its normal for many people to feel nervous when confronted with public speaking events.  This handout offered several helpful strategies for improving communication style in order to not only address audience in a confident, clear way, but also to manage related anxiety successful. Some of these tips included practicing one's speech beforehand and creating notecards to look upon if needed, making eye contact with audience to create a strong connection, being mindful of volume and tone of voice, pacing oneself to avoid rushing, and more.  Intervention was effective, as evidenced by Luster Landsberg actively engaging in discussion, and expressing receptiveness to several of these suggestions offered for improving public speaking approach.  Shaletta reported that  eye contact can be especially difficult, and she will plan to practice speeches with her sister in the future to build greater confidence, stating "I think I would probably be alright".  Tishina ended session early due to having an in-home nurse visit she had forgotten about.  Clinician will continue to monitor.            Plan: Follow up again in 1 week.    Diagnosis: Major depressive disorder, recurrent, moderate; Generalized Anxiety Disorder; and PTSD   Collaboration of Care:   No collaboration of care required for this visit.                                                   Patient/Guardian was advised Release of Information must be obtained prior to any record release in order to collaborate their care with an outside provider. Patient/Guardian was advised if they have not already done so to contact the registration department to sign all necessary forms in order for [name] to release information regarding their care.    Consent: Patient/Guardian gives verbal consent for treatment and assignment of benefits for services provided during this visit. Patient/Guardian expressed understanding and agreed to proceed.  Noralee Stain, [place], LCAS 01/28/23

## 2023-01-29 ENCOUNTER — Telehealth: Payer: Self-pay | Admitting: Urology

## 2023-01-29 NOTE — Telephone Encounter (Signed)
DOS - 11/26/22   HALLUX MPJ FUSION LEFT --- [id number]   [employer] EFFECTIVE DATE - 09/17/22  DEDUCTIBLE - $240.00 W/ $0.00 REMAINING   OOP - $8,850.00 W/  $3,084.64 REMAINING COINSURANCE - 20%  PER [employer]HC WEBSITE FOR CPT CODE 81191 Notification or Prior Authorization is not required for the requested services You are not required to submit a notification/prior authorization based on the information provided. If you have general questions about the prior authorization requirements, visit [email or web address] > Clinician Resources > Advance and Admission Notification Requirements. The number above acknowledges your notification. Please write this reference number down for future reference. If you would like to request an organization determination, please call us at [phone]. Decision ID #: [id number]

## 2023-01-31 ENCOUNTER — Telehealth: Payer: Self-pay | Admitting: Internal Medicine

## 2023-01-31 NOTE — Telephone Encounter (Signed)
   Pre-operative Risk Assessment    Patient Name: Meghan [name]  DOB: 1965-10-22 MRN: [id number]      Request for Surgical Clearance    Procedure:   Hallux MPJ Fusion of the Left Foot  Date of Surgery:  Clearance 02/25/23                                 Surgeon:  Dr. Nicholes Rough Surgeon's Group or Practice Name:  [facility] Phone number:  [phone] Fax number:  [phone]   Type of Clearance Requested:   - Medical  -Pharmacy   5. What type of anesthesia will be used?  Press F2 and select the anesthesia to be used for the procedure.  :1}  Type of Anesthesia:   Choice   Additional requests/questions:  Please advise surgeon/provider what medications should be held.  Barbette Reichmann   01/31/2023, 2:47 PM

## 2023-02-01 NOTE — Telephone Encounter (Signed)
   Primary Cardiologist: Maisie Fus, MD  Chart reviewed as part of pre-operative protocol coverage. Given past medical history and time since last visit, based on ACC/AHA guidelines, Meghan [name] would be at acceptable risk for the planned procedure without further cardiovascular testing.   She is able to achieve > 4 METS without concerning cardiac symptoms. Her RCRI for MACE is 0.9%, class II risk due to history of stroke.   Patient was advised that if she develops new symptoms prior to surgery to contact our office to arrange a follow-up appointment. She verbalized understanding.  I will route this recommendation to the requesting party via Epic fax function and remove from pre-op pool.  Please call with questions.  Levi Aland, NP-C  02/01/2023, 11:26 AM [address] Office [phone] Fax [phone]

## 2023-02-04 ENCOUNTER — Ambulatory Visit (INDEPENDENT_AMBULATORY_CARE_PROVIDER_SITE_OTHER): Payer: 59 | Admitting: Licensed Clinical Social Worker

## 2023-02-04 DIAGNOSIS — F4310 Post-traumatic stress disorder, unspecified: Secondary | ICD-10-CM

## 2023-02-04 DIAGNOSIS — F411 Generalized anxiety disorder: Secondary | ICD-10-CM

## 2023-02-04 DIAGNOSIS — F331 Major depressive disorder, recurrent, moderate: Secondary | ICD-10-CM | POA: Diagnosis not present

## 2023-02-04 NOTE — Progress Notes (Signed)
Virtual Visit via Video Note   I connected with Deshannon S. Cicio on 02/04/23 at 3:00pm by video enabled telemedicine application and verified that I am speaking with the correct person using two identifiers.   I discussed the limitations, risks, security and privacy concerns of performing an evaluation and management service by video and the availability of in person appointments. I also discussed with the patient that there may be a patient responsible charge related to this service. The patient expressed understanding and agreed to proceed.   I discussed the assessment and treatment plan with the patient. The patient was provided an opportunity to ask questions and all were answered. The patient agreed with the plan and demonstrated an understanding of the instructions.   The patient was advised to call back or seek an in-person evaluation if the symptoms worsen or if the condition fails to improve as anticipated.   I provided 1 hour of non-face-to-face time during this encounter.     Noralee Stain, LCSW, LCAS _______________________ THERAPIST PROGRESS NOTE   Session Time:  3:00pm - 4:00pm    Location: Patient: Patient Home Provider: [facility]   Participation Level: Active    Behavioral Response: Alert, casually dressed, angry/irritable mood/affect   Type of Therapy:  Individual Therapy   Treatment Goals addressed: Mood management; Medication management; Managing healthy boundaries with network.     Progress Towards Goals: Progressing    Interventions: CBT, psychoeducation on healthy relationships    Summary: Cassidee S. Haecker is a 57 year old divorced African American female that presented for therapy appointment today with diagnoses of Major Depressive Disorder, recurrent, moderate, Generalized Anxiety Disorder and PTSD.      Suicidal/Homicidal: None; without plan or intent                                                                                                                 Therapist Response:  Clinician met with Eevee for virtual therapy session and assessed for safety, sobriety, and medication compliance.  Avaiah presented for appointment on time and was alert, oriented x5, with no evidence or self-report of active SI/HI or A/V H.  Lavetta reported ongoing compliance with medication and denied any use of alcohol or illicit substances.  Clinician inquired about Aireonna's emotional ratings today, as well as any significant changes in thoughts, feelings, or behavior since last check-in.  Bradleigh reported scores of 0/10 for depression, 4/10 for anxiety, and 10/10 for anger/irritability.  Kellina denied any recent panic attacks or outbursts.  Adena reported that a recent struggle has been feeling "Embarrassed and angry" about how a particular friendship has fallen apart.  She reported that she was hopeful that they could become more than friends, but upon sharing how she felt, stating "I guess I scared him off.  He doesn't answer my calls now".  Clinician utilized a handout today with Aleezah today focused on effectively differentiating between healthy versus unhealthy relationships.  This handout explained how no relationship is perfect, but certain characteristics can help gauge  the overall quality of a support (I.e. respect versus disrespect, trust versus jealousy, honesty versus betrayal, etc).  Clinician inquired about which qualities have been present in the developing friendship thus far, as well as any red flags which could indicate need for firmer boundaries due to perceived risk towards her mental health and wellbeing.  Intervention was effective, as evidenced by Quetzal's active engagement in discussion on topic, reporting that although there were initially several positive traits present in this friendship such as mutual respect, and appreciation, she is now alarmed by recent changes, such as lack of trust, honesty, patience, mixed communication, stubbornness, and inflexibility, which  has led her to consider adjusting boundaries with this person to spend less time and attention on them.  Kalsey stated "I'm honest with him about how I feel, but he's not.  I think I need to move on".  Clinician will continue to monitor.            Plan: Follow up again in 1 week.    Diagnosis: Major depressive disorder, recurrent, moderate; Generalized Anxiety Disorder; and PTSD   Collaboration of Care:   No collaboration of care required for this visit.                                                   Patient/Guardian was advised Release of Information must be obtained prior to any record release in order to collaborate their care with an outside provider. Patient/Guardian was advised if they have not already done so to contact the registration department to sign all necessary forms in order for [name] to release information regarding their care.    Consent: Patient/Guardian gives verbal consent for treatment and assignment of benefits for services provided during this visit. Patient/Guardian expressed understanding and agreed to proceed.  Noralee Stain, LCSW, LCAS 02/04/23

## 2023-02-12 ENCOUNTER — Ambulatory Visit (INDEPENDENT_AMBULATORY_CARE_PROVIDER_SITE_OTHER): Payer: 59 | Admitting: Licensed Clinical Social Worker

## 2023-02-12 DIAGNOSIS — F411 Generalized anxiety disorder: Secondary | ICD-10-CM

## 2023-02-12 DIAGNOSIS — F4310 Post-traumatic stress disorder, unspecified: Secondary | ICD-10-CM | POA: Diagnosis not present

## 2023-02-12 DIAGNOSIS — F331 Major depressive disorder, recurrent, moderate: Secondary | ICD-10-CM | POA: Diagnosis not present

## 2023-02-12 NOTE — Progress Notes (Signed)
Virtual Visit via Video Note   I connected with Meghan [name] on 02/12/23 at 3:00pm by video enabled telemedicine application and verified that I am speaking with the correct person using two identifiers.   I discussed the limitations, risks, security and privacy concerns of performing an evaluation and management service by video and the availability of in person appointments. I also discussed with the patient that there may be a patient responsible charge related to this service. The patient expressed understanding and agreed to proceed.   I discussed the assessment and treatment plan with the patient. The patient was provided an opportunity to ask questions and all were answered. The patient agreed with the plan and demonstrated an understanding of the instructions.   The patient was advised to call back or seek an in-person evaluation if the symptoms worsen or if the condition fails to improve as anticipated.   I provided 1 hour of non-face-to-face time during this encounter.     Noralee Stain, LCSW, LCAS _______________________ THERAPIST PROGRESS NOTE   Session Time:  3:00pm - 4:00pm   Location: Patient: Patient Home Provider: [facility]   Participation Level: Active    Behavioral Response: Alert, casually dressed, anxious mood/affect   Type of Therapy:  Individual Therapy   Treatment Goals addressed: Mood management; Medication management  Progress Towards Goals: Progressing    Interventions: CBT: challenging anxious thoughts    Summary: Meghan [name] is a 57 year old divorced African American female that presented for therapy appointment today with diagnoses of Major Depressive Disorder, recurrent, moderate, Generalized Anxiety Disorder and PTSD.      Suicidal/Homicidal: None; without plan or intent                                                                                                                Therapist Response:  Clinician met with Meghan [name] for virtual  therapy appointment and assessed for safety, sobriety, and medication compliance.  Meghan [name] presented for session on time and was alert, oriented x5, with no evidence or self-report of active SI/HI or A/V H.  Meghan [name] reported ongoing compliance with medication and denied any use of alcohol or illicit substances.  Clinician inquired about Meghan [name]'s current emotional ratings, as well as any significant changes in thoughts, feelings, or behavior since previous check-in.  Meghan [name] reported scores of 0/10 for depression, 4/10 for anxiety, and 0/10 for anger/irritability.  Meghan [name] denied any recent panic attacks or outbursts.  Meghan [name] reported that a recent success was attending church over the weekend and enjoying herself.  Meghan [name] reported that a current struggle is worrying about an upcoming foot surgery next Monday, stating "Here we go again".  Clinician utilized handout in session today titled "Worry exploration" in order to assist Meghan [name] in reducing her anxiety related to upcoming medical appointment.  This worksheet featured a series of Socratic questions aimed at exploring the most likely outcomes for a situation of concern, rather than focusing on the worst possible outcome (i.e. catastrophizing).  Clinician assisted Meghan [name] in identifying  and challenging any irrational beliefs related to this worry, in addition to utilizing problem solving approach to explore strategies which would help her accomplish goal of attending medical procedure with minimal stress.  Meghan [name] actively participated in discussion on handout, reporting that she is worried that she will be faced with boredom, since she will be stuck in house for first 2 weeks of recovery.  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  Intervention was effective, as evidenced by [name] reporting that discussion on this subject reduced her anxiety down to 2/10, and increased her confidence in her ability to fill idle time with positive, engaging activity while she recovers.  Meghan [name] stated "I have plenty of things to do.  I think my book will be my number 1 priority".  Clinician will continue to monitor.           Plan: Follow up again in 1 week.    Diagnosis: Major depressive disorder, recurrent, moderate; Generalized Anxiety Disorder; and PTSD   Collaboration of Care:   No collaboration of care required for this visit.                                                   Patient/Guardian was advised Release of Information must be obtained prior to any record release in order to collaborate their care with an outside provider. Patient/Guardian was advised if they have not already done so to contact the registration department to sign all necessary forms in order for [name] to release information regarding their care.    Consent: Patient/Guardian gives verbal consent for treatment and assignment of benefits for services provided during this visit. Patient/Guardian expressed understanding and agreed to proceed.  Noralee Stain, LCSW, LCAS 02/12/23

## 2023-02-15 ENCOUNTER — Encounter
Admission: RE | Admit: 2023-02-15 | Discharge: 2023-02-15 | Disposition: A | Discharge status: Home / Self Care | Payer: 59 | Source: Ambulatory Visit | Attending: Podiatry | Admitting: Podiatry

## 2023-02-15 VITALS — Ht 65.0 in | Wt 314.0 lb

## 2023-02-15 DIAGNOSIS — Z79899 Other long term (current) drug therapy: Secondary | ICD-10-CM

## 2023-02-15 DIAGNOSIS — I4891 Unspecified atrial fibrillation: Secondary | ICD-10-CM

## 2023-02-15 DIAGNOSIS — E119 Type 2 diabetes mellitus without complications: Secondary | ICD-10-CM

## 2023-02-15 DIAGNOSIS — Z01818 Encounter for other preprocedural examination: Secondary | ICD-10-CM

## 2023-02-15 DIAGNOSIS — Z01812 Encounter for preprocedural laboratory examination: Secondary | ICD-10-CM

## 2023-02-15 DIAGNOSIS — I509 Heart failure, unspecified: Secondary | ICD-10-CM

## 2023-02-15 HISTORY — DX: Obesity, class 3: E66.813

## 2023-02-15 HISTORY — DX: Body Mass Index (BMI) 40.0 and over, adult: Z684

## 2023-02-15 HISTORY — DX: Personal history of other (healed) physical injury and trauma: Z87.828

## 2023-02-15 HISTORY — DX: Presence of other vascular implants and grafts: Z95.828

## 2023-02-15 HISTORY — DX: Ileus, unspecified: K56.7

## 2023-02-15 HISTORY — DX: Unspecified atrial flutter: I48.92

## 2023-02-15 HISTORY — DX: Obstructive sleep apnea (adult) (pediatric): G47.33

## 2023-02-15 HISTORY — DX: Morbid (severe) obesity due to excess calories: E66.01

## 2023-02-15 NOTE — Patient Instructions (Signed)
Your procedure is scheduled on:02-25-23 Monday Report to the Registration Desk on the 1st floor of the [facility].The proceed to the 2nd floor Surgery Desk  To find out your arrival time, please call [phone] between 1PM - 3PM on:02-22-23 Friday If your arrival time is 6:00 am, do not arrive before that time as the [facility] entrance doors do not open until 6:00 am.  REMEMBER: Instructions that are not followed completely may result in serious medical risk, up to and including death; or upon the discretion of your surgeon and anesthesiologist your surgery may need to be rescheduled.  Do not eat food after midnight the night before surgery.  No gum chewing or hard candies.  You may however, drink Water up to 2 hours before you are scheduled to arrive for your surgery. Do not drink anything within 2 hours of your scheduled arrival time  One week prior to surgery:Last dose will be on 02-17-23 Sunday Stop Anti-inflammatories (NSAIDS) such as Advil, Aleve, Ibuprofen, Motrin, Naproxen, Naprosyn and Aspirin based products such as Excedrin, Goody's Powder, BC Powder.You may however, continue to take Tylenol if needed for pain up until the day of surgery. Stop ANY OVER THE COUNTER supplements/vitamins 7 days prior to surgery (Fish Oil and Vitamin D3)   Continue taking all prescribed medications with the exception of the following: -Stop your rivaroxaban (XARELTO) 3 days prior to surgery-Last dose will be on 02-21-23 Thursday -Stop your metFORMIN (GLUCOPHAGE) 2 days prior to surgery-Last dose will be on 02-22-23 Friday -Stop your RYBELSUS 1 day prior to surgery-Last dose will be on 02-23-23 Saturday   TAKE ONLY THESE MEDICATIONS THE MORNING OF SURGERY WITH A SIP OF WATER: -flecainide (TAMBOCOR)  -levETIRAcetam (KEPPRA)  -metoprolol succinate (TOPROL-XL)  -omeprazole (PRILOSEC)  -sertraline (ZOLOFT)   Use your albuterol (VENTOLIN HFA) Inhaler the day of surgery and bring Inhaler to the  hospital  No Alcohol for 24 hours before or after surgery.  No Smoking including e-cigarettes for 24 hours before surgery.  No chewable tobacco products for at least 6 hours before surgery.  No nicotine patches on the day of surgery.  Do not use any "recreational" drugs for at least a week (preferably 2 weeks) before your surgery.  Please be advised that the combination of cocaine and anesthesia may have negative outcomes, up to and including death. If you test positive for cocaine, your surgery will be cancelled.  On the morning of surgery brush your teeth with toothpaste and water, you may rinse your mouth with mouthwash if you wish. Do not swallow any toothpaste or mouthwash.  Use CHG Soap as directed on instruction sheet.  Do not wear jewelry, make-up, hairpins, clips or nail polish.  Do not wear lotions, powders, or perfumes.   Do not shave body hair from the neck down 48 hours before surgery.  Contact lenses, hearing aids and dentures may not be worn into surgery.  Do not bring valuables to the hospital. [facility] is not responsible for any missing/lost belongings or valuables.   Bring your C-PAP to the hospital   Notify your doctor if there is any change in your medical condition (cold, fever, infection).  Wear comfortable clothing (specific to your surgery type) to the hospital.  After surgery, you can help prevent lung complications by doing breathing exercises.  Take deep breaths and cough every 1-2 hours. Your doctor may order a device called an Incentive Spirometer to help you take deep breaths. When coughing or sneezing, hold a  pillow firmly against your incision with both hands. This is called "splinting." Doing this helps protect your incision. It also decreases belly discomfort.  If you are being admitted to the hospital overnight, leave your suitcase in the car. After surgery it may be brought to your room.  In case of increased patient census, it may be  necessary for you, the patient, to continue your postoperative care in the Same Day Surgery department.  If you are being discharged the day of surgery, you will not be allowed to drive home. You will need a responsible individual to drive you home and stay with you for 24 hours after surgery.   If you are taking public transportation, you will need to have a responsible individual with you.  Please call the Pre-admissions Testing Dept. at [phone] if you have any questions about these instructions.  Surgery Visitation Policy:  Patients having surgery or a procedure may have two visitors.  Children under the age of 69 must have an adult with them who is not the patient.     Preparing for Surgery with CHLORHEXIDINE GLUCONATE (CHG) Soap  Chlorhexidine Gluconate (CHG) Soap  o An antiseptic cleaner that kills germs and bonds with the skin to continue killing germs even after washing  o Used for showering the night before surgery and morning of surgery  Before surgery, you can play an important role by reducing the number of germs on your skin.  CHG (Chlorhexidine gluconate) soap is an antiseptic cleanser which kills germs and bonds with the skin to continue killing germs even after washing.  Please do not use if you have an allergy to CHG or antibacterial soaps. If your skin becomes reddened/irritated stop using the CHG.  1. Shower the NIGHT BEFORE SURGERY and the MORNING OF SURGERY with CHG soap.  2. If you choose to wash your hair, wash your hair first as usual with your normal shampoo.  3. After shampooing, rinse your hair and body thoroughly to remove the shampoo.  4. Use CHG as you would any other liquid soap. You can apply CHG directly to the skin and wash gently with a scrungie or a clean washcloth.  5. Apply the CHG soap to your body only from the neck down. Do not use on open wounds or open sores. Avoid contact with your eyes, ears, mouth, and genitals (private  parts). Wash face and genitals (private parts) with your normal soap.  6. Wash thoroughly, paying special attention to the area where your surgery will be performed.  7. Thoroughly rinse your body with warm water.  8. Do not shower/wash with your normal soap after using and rinsing off the CHG soap.  9. Pat yourself dry with a clean towel.  10. Wear clean pajamas to bed the night before surgery.  12. Place clean sheets on your bed the night of your first shower and do not sleep with pets.  13. Shower again with the CHG soap on the day of surgery prior to arriving at the hospital.  14. Do not apply any deodorants/lotions/powders.  15. Please wear clean clothes to the hospital.

## 2023-02-19 ENCOUNTER — Encounter
Admission: RE | Admit: 2023-02-19 | Discharge: 2023-02-19 | Disposition: A | Discharge status: Home / Self Care | Payer: 59 | Source: Ambulatory Visit | Attending: Podiatry | Admitting: Podiatry

## 2023-02-19 DIAGNOSIS — I509 Heart failure, unspecified: Secondary | ICD-10-CM

## 2023-02-19 DIAGNOSIS — Z79899 Other long term (current) drug therapy: Secondary | ICD-10-CM

## 2023-02-19 DIAGNOSIS — Z01812 Encounter for preprocedural laboratory examination: Secondary | ICD-10-CM

## 2023-02-19 DIAGNOSIS — E119 Type 2 diabetes mellitus without complications: Secondary | ICD-10-CM

## 2023-02-19 DIAGNOSIS — I4891 Unspecified atrial fibrillation: Secondary | ICD-10-CM | POA: Insufficient documentation

## 2023-02-19 LAB — BASIC METABOLIC PANEL
Anion gap: 9 (ref 5–15)
BUN: 15 mg/dL (ref 6–20)
CO2: 27 mmol/L (ref 22–32)
Calcium: 10.2 mg/dL (ref 8.9–10.3)
Chloride: 103 mmol/L (ref 98–111)
Creatinine, Ser: 0.78 mg/dL (ref 0.44–1.00)
GFR, Estimated: >60 mL/min (ref >60)
Glucose, Bld: 91 mg/dL (ref 70–99)
Potassium: 3.7 mmol/L (ref 3.5–5.1)
Sodium: 139 mmol/L (ref 135–145)

## 2023-02-20 ENCOUNTER — Encounter: Payer: Self-pay | Admitting: Podiatry

## 2023-02-20 ENCOUNTER — Ambulatory Visit (INDEPENDENT_AMBULATORY_CARE_PROVIDER_SITE_OTHER): Payer: 59 | Admitting: Licensed Clinical Social Worker

## 2023-02-20 DIAGNOSIS — F4310 Post-traumatic stress disorder, unspecified: Secondary | ICD-10-CM | POA: Diagnosis not present

## 2023-02-20 DIAGNOSIS — F331 Major depressive disorder, recurrent, moderate: Secondary | ICD-10-CM

## 2023-02-20 DIAGNOSIS — F411 Generalized anxiety disorder: Secondary | ICD-10-CM

## 2023-02-20 NOTE — H&P (View-Only) (Signed)
Perioperative / Anesthesia Services  Pre-Admission Testing Clinical Review / Preoperative Anesthesia Consult  Date: 02/22/23  Patient Demographics:  Name: Meghan [name] DOB:   October 02, 1965 MRN:   [id number]  Planned Surgical Procedure(s):    Case: [id number] Date/Time: 02/25/23 0715   Procedure: ARTHRODESIS METATARSALPHALANGEAL JOINT (MTPJ) (Left: Toe)   Anesthesia type: Choice   Pre-op diagnosis: ARTHRITIS LEFT FOOT   Location: [facility] OR ROOM 03 / [facility]   Surgeons: Meghan [name], DPM     NOTE: Available PAT nursing documentation and vital signs have been reviewed. Clinical nursing staff has updated patient's PMH/PSHx, current medication list, and drug allergies/intolerances to ensure comprehensive history available to assist in medical decision making as it pertains to the aforementioned surgical procedure and anticipated anesthetic course. Extensive review of available clinical information personally performed. [name] updated with any diagnoses/procedures that  may have been inadvertently omitted during her intake with the pre-admission testing department's nursing staff.  Clinical Discussion:  Meghan [name] is a 57 y.o. female who is submitted for pre-surgical anesthesia review and clearance prior to her undergoing the above procedure. Patient has never been a smoker. 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  Patient is followed by cardiology Meghan Mood, MD). She was last seen in the cardiology clinic on 12/06/2022; notes reviewed. At the time of her clinic visit, patient reporting episodes of shortness of breath, fatigue, and palpitations.  She denied any associated chest pain, PND, orthopnea,  significant peripheral edema, weakness, vertiginous symptoms, or presyncope/syncope. Patient with a past medical history significant for cardiovascular diagnoses. Documented physical exam was grossly benign, providing no evidence of acute exacerbation and/or decompensation of the patient's known cardiovascular conditions.  Of note, patient's records regarding her complete cardiovascular history unavailable for review at time of consult.  Patient has received care in the state of [place].  Information gathered from patient report and from notes provided by her local cardiologist.  Patient reportedly has suffered 2 separate strokes in the past (1989 and 1995).  Patient with residual LEFT-sided weakness as a late effect of her previous neurological events.  Patient has a history of recurrent pulmonary emboli.  She underwent IVC filter placement in 2003.  She was previously followed by hematology while living in [place].  Patient is on chronic lifelong anticoagulation therapy.  Patient with reported HFpEF that was diagnosed several years ago, again while living in [place].  Most recent TTE was performed on 08/14/2022 revealing a normal left ventricular systolic function with an EF of 60-65%.  There were no regional wall motion abnormalities.  Moderate LVH observed.  Left ventricular diastolic Doppler parameters indeterminant.  Right ventricular size and function normal.  Left atrium mildly dilated.  There was trivial mitral valve regurgitation.  All transvalvular gradients were noted to be normal providing no evidence suggestive of valvular stenosis.  Aorta normal in size with no evidence of aneurysmal dilatation.  Patient with an atrial fibrillation/flutter diagnosis; CHA2DS2-VASc Score = 6 (sex, HFpEF, HTN, CVA x 2, T2DM). In efforts to control her atrial arrhythmia, patient underwent DCCV procedure on 08/22/2022, at which time she received a single 200 J synchronized cardioversion that only  briefly restored NSR.  She is subsequently underwent radiofrequency ablation (CTI) on 11/02/2022.  Patient remains  on oral flecainide + metoprolol succinate for rate and rhythm management.  She is chronically anticoagulated using rivaroxaban; reported to be compliant with therapy with no evidence or reports of GI bleeding.  Blood pressure well-controlled at 98/66 mmHg on currently prescribed beta-blocker (metoprolol succinate) and diuretic (bumetanide) therapies. She is on atorvastatin for her HLD diagnosis and further ASCVD prevention.  T2DM well-controlled on currently prescribed regimen; last HgbA1c was 5.0% when checked on 11/08/2022.  Patient is not active per baseline, however she is able to complete all ADLs/IADLs without cardiovascular limitation. Functional capacity, as defined by DASI, is documented as being >/= 4 METS.  No changes were made to her medication regimen.  Patient follow-up with outpatient cardiology in 6 months or sooner if needed.  Meghan [name] is scheduled for an elective ARTHRODESIS METATARSALPHALANGEAL JOINT (MTPJ) (Left: Toe) on 02/25/2023 with Dr. Nicholes Rough, DPM.  Given patient's past medical history significant for cardiovascular diagnoses and multiple medical comorbidities, presurgical clearances from patient's internal/family medicine provider and primary cardiology team were sought by the PAT team.  Clearances were obtained as follows:  Per internal/family medicine Meghan Hurst, FNP-C), "patient is okay for surgery medically, however may need clearance from cardiology".  Per cardiology Meghan Hoard, NP-C for [place], MD), "based ACC/AHA guidelines, the patient's past medical history, and the amount of time since her last clinic visit, this patient would be at an overall ACCEPTABLE risk for the planned procedure without further cardiovascular testing or intervention at this time".   Again, this patient is on daily oral anticoagulation therapy.  She has been instructed on  recommendations for holding her rivaroxaban for 3 days prior to her procedure with plans to restart as soon as postoperative bleeding risk felt to be minimized by her attending surgeon. The patient has been instructed that her last dose of her rivaroxaban should be on 02/21/2023.  Patient denies previous perioperative complications with anesthesia in the past. In review of the available records, it is noted that patient underwent a general anesthetic course at [facility] ([facility] in 10/2022 without documented complications.      02/15/2023    2:00 PM 12/31/2022   12:56 PM 12/31/2022   12:53 PM  Vitals with BMI  Height 5\' 5"   5\' 5"   Weight 314 lbs  317 lbs 7 oz  BMI 52.25  52.83  Systolic  128   Diastolic  66   Pulse  62     Providers/Specialists:   NOTE: Primary physician provider listed below. Patient may have been seen by APP or partner within same practice.   PROVIDER ROLE / SPECIALTY LAST OV  Meghan [name], DPM Podiatry (Surgeon) 10/04/2022  Felix Pacini, FNP Primary Care Provider 09/20/2022  Dina Rich, MD Cardiology 12/06/2022; update preop APP call on 02/01/2023  Coralyn Helling, MD Pulmonary Medicine 12/31/2022   Allergies:  Acetaminophen-codeine, Bee venom, Meloxicam, Tramadol, Codeine, Coconut (cocos nucifera), and Tomato  Current Home Medications:   No current facility-administered medications for this encounter.    acetaminophen (TYLENOL) 500 MG tablet   albuterol (VENTOLIN HFA) 108 (90 Base) MCG/ACT inhaler   atorvastatin (LIPITOR) 40 MG tablet   bumetanide (BUMEX) 1 MG tablet   Cholecalciferol (VITAMIN D-3) 125 MCG (5000 UT) TABS   flecainide (TAMBOCOR) 50 MG tablet   levETIRAcetam (KEPPRA) 500 MG tablet   Menthol-Camphor (TIGER BALM ARTHRITIS RUB) 11-11 % CREA   metFORMIN (GLUCOPHAGE) 500 MG tablet   metoprolol succinate (TOPROL-XL) 25 MG 24 hr tablet   Omega-3 Fatty  Acids (FISH OIL) 1200 MG CAPS   omeprazole (PRILOSEC) 40 MG capsule    rivaroxaban (XARELTO) 20 MG TABS tablet   RYBELSUS 14 MG TABS   sertraline (ZOLOFT) 50 MG tablet   History:   Past Medical History:  Diagnosis Date   (HFpEF) heart failure with preserved ejection fraction (HCC) 01/2019   a.) Dx'd in [place]; b.) TTE 08/14/2022: EF 60-65%, mod LVH, mild LAE, triv MR   Anxiety    Asthma    Atrial fibrillation and flutter (HCC) 02/16/2019   a.) CHA2DS2VASc = 6 (sex, HFpEF, HTN, CVA x2, T2DM);  b.) s/p DCCV (200 J x 1) 08/22/2022; c.) s/p RF ablation (CTI) 11/02/2022; d.) rate/rhythm maintained on oral flecainide + metoprolol succinate; chronically anticoagulated with rivaroxaban   Class 3 severe obesity due to excess calories with body mass index (BMI) of 50.0 to 59.9 in adult [facility])    DDD (degenerative disc disease), lumbar    GERD (gastroesophageal reflux disease)    Hiatal hernia    History of head injury    HTN (hypertension)    Hypercholesterolemia    Ileus (HCC)    Intractable nausea and vomiting 11/16/2020   Long term current use of anticoagulant    a.) rivaroxaban   Macular degeneration of left eye    MDD (major depressive disorder)    OA (osteoarthritis)    OSA on CPAP    Pneumonia    Presence of IVC filter    PTSD (post-traumatic stress disorder)    Recurrent pulmonary emboli (HCC)    a.) s/p IVC filter placement 2003; b.) previously followed by hematology when living in [place], [place]   Seizure disorder [facility])    a.) last was in 2020 per pt report   Stroke (HCC)    1989 and 1995 (left sided weakness)   T2DM (type 2 diabetes mellitus) (HCC)    Vitamin D deficiency    Past Surgical History:  Procedure Laterality Date   A-FLUTTER ABLATION N/A 11/02/2022   Procedure: A-FLUTTER ABLATION;  Surgeon: Mealor, Roberts Gaudy, MD;  [facility]: [facility];  Service: Cardiovascular;  Laterality: N/A;   CARDIAC CATHETERIZATION  2017   in [facility] in [place]   CARDIOVERSION N/A 08/22/2022   Procedure: CARDIOVERSION;   Surgeon: Thomasene Ripple, DO;  Location: [facility] ENDOSCOPY;  Service: Cardiovascular;  Laterality: N/A;   CESAREAN SECTION N/A 1989   CESAREAN SECTION N/A 1993   CHOLECYSTECTOMY  2003   COLONOSCOPY WITH PROPOFOL N/A 06/05/2019   Procedure: COLONOSCOPY WITH PROPOFOL;  Surgeon: Jeani Hawking, MD;  Location: [facility] ENDOSCOPY;  Service: Endoscopy;  Laterality: N/A;   CYST REMOVAL WITH BONE GRAFT Left 10/18/2020   Procedure: BONE GRAFTING OF ENCHONDROMA MIDDLE PHANLANX OF LEFT MIDDLE FINGER;  Surgeon: Cindee Salt, MD;  Location: [facility] OR;  Service: Orthopedics;  Laterality: Left;  AXILLARY BLOCK   DIAGNOSTIC LAPAROSCOPY  2015; 2017   lap hernia repair x2   ENDOVENOUS ABLATION SAPHENOUS VEIN W/ LASER Left 08/31/2021   endovenous laser ablation left greater saphenous vein and stab phlebectomy 10-20 incisions left leg by Lemar Livings MD   HALLUX FUSION Right 05/28/2022   Procedure: HALLUX FUSION METATARSAL PHALANGEAL JOIT;  Surgeon: Meghan [name], DPM;  Location: [facility];  Service: Podiatry;  Laterality: Right;  BLOCK   HERNIA REPAIR  2014, 2018   umbilical hernia repair   IVC FILTER INSERTION  2003   POLYPECTOMY  06/05/2019   Procedure: POLYPECTOMY;  Surgeon: Jeani Hawking, MD;  Location: [facility];  Service: Endoscopy;;   SHOULDER ARTHROSCOPY W/ ROTATOR CUFF REPAIR Right 08/28/2017   TOTAL ABDOMINAL HYSTERECTOMY  2003   UPPER GI ENDOSCOPY     WRIST ARTHROSCOPY WITH DEBRIDEMENT Left 10/18/2020   Procedure: LEFT WRIST ARTHROSCOPY WITH DEBRIDEMENT;  Surgeon: Cindee Salt, MD;  Location: [facility] OR;  Service: Orthopedics;  Laterality: Left;  AXILLARY BLOCK   WRIST ARTHROSCOPY WITH DEBRIDEMENT Right 01/25/2022   Procedure: ARTHROSCOPY RIGHT WRIST WITH  DEBRIDEMENT/ SHRINKAGE;  Surgeon: Betha Loa, MD;  Location: [facility] OR;  Service: Orthopedics;  Laterality: Right;   Family History  Problem Relation Age of Onset   Breast cancer Mother    Heart attack Father    Diabetes Father    Congestive Heart  Failure Father    Breast cancer Maternal Grandmother    Social History   Tobacco Use   Smoking status: Never   Smokeless tobacco: Never  Vaping Use   Vaping Use: Never used  Substance Use Topics   Alcohol use: Never   Drug use: Never    Pertinent Clinical Results:  LABS:   No visits with results within 3 Day(s) from this visit.  Latest known visit with results is:  Hospital Outpatient Visit on 02/19/2023  Component Date Value Ref Range Status   Sodium 02/19/2023 139  135 - 145 mmol/L Final   Potassium 02/19/2023 3.7  3.5 - 5.1 mmol/L Final   Chloride 02/19/2023 103  98 - 111 mmol/L Final   CO2 02/19/2023 27  22 - 32 mmol/L Final   Glucose, Bld 02/19/2023 91  70 - 99 mg/dL Final   Glucose reference range applies only to samples taken after fasting for at least 8 hours.   BUN 02/19/2023 15  6 - 20 mg/dL Final   Creatinine, Ser 02/19/2023 0.78  0.44 - 1.00 mg/dL Final   Calcium 91/47/8295 10.2  8.9 - 10.3 mg/dL Final   GFR, Estimated 02/19/2023 >60  >60 mL/min Final   Comment: (NOTE) Calculated using the CKD-EPI Creatinine Equation (2021)    Anion gap 02/19/2023 9  5 - 15 Final   Performed at [facility], [address] [place], [place] [address]   Lab Results  Component Value Date   WBC 6.6 12/31/2022   HGB 11.0 (L) 12/31/2022   HCT 35.2 (L) 12/31/2022   MCV 86.1 12/31/2022   PLT 245 12/31/2022    ECG: Date: 12/06/2022 Time ECG obtained: 0952 AM Rate: 63 bpm Rhythm:  Sinus rhythm with first-degree AV block Axis (leads I and aVF): Normal Intervals: PR 222 ms. QRS 88 ms. QTc 413 ms. ST segment and T wave changes: No evidence of acute ST segment elevation or depression Comparison: Similar to previous tracing obtained on 11/14/2022   IMAGING / PROCEDURES: TRANSTHORACIC ECHOCARDIOGRAM performed on 08/14/2022 Left ventricular ejection fraction, by estimation, is 60 to 65%. The left ventricle has normal function. The left ventricle has no regional wall  motion abnormalities. There is moderate left ventricular hypertrophy. Left ventricular diastolic  parameters are indeterminate.  Right ventricular systolic function is normal. The right ventricular size is normal.  Left atrial size was mildly dilated.  The mitral valve is normal in structure. Trivial mitral valve regurgitation. No evidence of mitral stenosis.  The aortic valve is tricuspid. Aortic valve regurgitation is not visualized. No aortic stenosis is present.  The inferior vena cava is normal in size with greater than 50% respiratory variability, suggesting right atrial pressure of 3 mmHg.   DIAGNOSTIC RADIOGRAPHS OF  RIGHT FOOT COMPLETE performed on 03/06/2022 Soft tissue swelling in the foot, particularly dorsally. No acute fracture or dislocation. No radiographic evidence of osteomyelitis. Moderate hallux valgus and bunion changes. Mild first MTP joint osteoarthritis.  DIAGNOSTIC RADIOGRAPHS OF LUMBAR SPINE 2-3 VIEWS performed on 11/26/2022 Lower lumbar degenerative disc disease and degenerative facet disease with degenerative anterolisthesis on L4 on L5 of 5 mm.  This could certainly be symptomatic. IVC filter in place.  Impression and Plan:  Meghan [name] has been referred for pre-anesthesia review and clearance prior to her undergoing the planned anesthetic and procedural courses. Available labs, pertinent testing, and imaging results were personally reviewed by me in preparation for upcoming operative/procedural course. [facility] medical record has been updated following extensive record review and patient interview with PAT staff.   This patient has been appropriately cleared by cardiology (ACCEPTABLE) and by internal/family medicine (ACCEPTABLE) with the individually indicated risk of significant perioperative complications. Based on clinical review performed today (02/22/23), barring any significant acute changes in the patient's overall condition, it is anticipated that she  will be able to proceed with the planned surgical intervention. Any acute changes in clinical condition may necessitate her procedure being postponed and/or cancelled. Patient will meet with anesthesia team (MD and/or CRNA) on the day of her procedure for preoperative evaluation/assessment. Questions regarding anesthetic course will be fielded at that time.   Pre-surgical instructions were reviewed with the patient during her PAT appointment, and questions were fielded to satisfaction by PAT clinical staff. She has been instructed on which medications that she will need to hold prior to surgery, as well as the ones that have been deemed safe/appropriate to take on the day of her procedure. As part of the general education provided by PAT, patient made aware both verbally and in writing, that she would need to abstain from the use of any illegal substances during her perioperative course.  She was advised that failure to follow the provided instructions could necessitate case cancellation or result in serious perioperative complications up to and including death. Patient encouraged to contact PAT and/or her surgeon's office to discuss any questions or concerns that may arise prior to surgery; verbalized understanding.   Quentin Mulling, MSN, APRN, FNP-C, CEN [facility]  Peri-operative Services Nurse Practitioner Phone: [phone] Fax: [phone] 02/22/23 7:53 AM  NOTE: This note has been prepared using Dragon dictation software. Despite my best ability to proofread, there is always the potential that unintentional transcriptional errors may still occur from this process.

## 2023-02-20 NOTE — Progress Notes (Signed)
Perioperative / Anesthesia Services  Pre-Admission Testing Clinical Review / Preoperative Anesthesia Consult  Date: 02/22/23  Patient Demographics:  Name: Meghan [name] DOB:   October 02, 1965 MRN:   [id number]  Planned Surgical Procedure(s):    Case: [id number] Date/Time: 02/25/23 0715   Procedure: ARTHRODESIS METATARSALPHALANGEAL JOINT (MTPJ) (Left: Toe)   Anesthesia type: Choice   Pre-op diagnosis: ARTHRITIS LEFT FOOT   Location: [facility] OR ROOM 03 / [facility]   Surgeons: Candelaria Stagers, DPM     NOTE: Available PAT nursing documentation and vital signs have been reviewed. Clinical nursing staff has updated patient's PMH/PSHx, current medication list, and drug allergies/intolerances to ensure comprehensive history available to assist in medical decision making as it pertains to the aforementioned surgical procedure and anticipated anesthetic course. Extensive review of available clinical information personally performed. [name] updated with any diagnoses/procedures that  may have been inadvertently omitted during her intake with the pre-admission testing department's nursing staff.  Clinical Discussion:  Meghan [name] is a 57 y.o. female who is submitted for pre-surgical anesthesia review and clearance prior to her undergoing the above procedure. Patient has never been a smoker. 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  Patient is followed by cardiology Wyline Mood, MD). She was last seen in the cardiology clinic on 12/06/2022; notes reviewed. At the time of her clinic visit, patient reporting episodes of shortness of breath, fatigue, and palpitations.  She denied any associated chest pain, PND, orthopnea,  significant peripheral edema, weakness, vertiginous symptoms, or presyncope/syncope. Patient with a past medical history significant for cardiovascular diagnoses. Documented physical exam was grossly benign, providing no evidence of acute exacerbation and/or decompensation of the patient's known cardiovascular conditions.  Of note, patient's records regarding her complete cardiovascular history unavailable for review at time of consult.  Patient has received care in the state of [place].  Information gathered from patient report and from notes provided by her local cardiologist.  Patient reportedly has suffered 2 separate strokes in the past (1989 and 1995).  Patient with residual LEFT-sided weakness as a late effect of her previous neurological events.  Patient has a history of recurrent pulmonary emboli.  She underwent IVC filter placement in 2003.  She was previously followed by hematology while living in [place].  Patient is on chronic lifelong anticoagulation therapy.  Patient with reported HFpEF that was diagnosed several years ago, again while living in [place].  Most recent TTE was performed on 08/14/2022 revealing a normal left ventricular systolic function with an EF of 60-65%.  There were no regional wall motion abnormalities.  Moderate LVH observed.  Left ventricular diastolic Doppler parameters indeterminant.  Right ventricular size and function normal.  Left atrium mildly dilated.  There was trivial mitral valve regurgitation.  All transvalvular gradients were noted to be normal providing no evidence suggestive of valvular stenosis.  Aorta normal in size with no evidence of aneurysmal dilatation.  Patient with an atrial fibrillation/flutter diagnosis; CHA2DS2-VASc Score = 6 (sex, HFpEF, HTN, CVA x 2, T2DM). In efforts to control her atrial arrhythmia, patient underwent DCCV procedure on 08/22/2022, at which time she received a single 200 J synchronized cardioversion that only  briefly restored NSR.  She is subsequently underwent radiofrequency ablation (CTI) on 11/02/2022.  Patient remains  on oral flecainide + metoprolol succinate for rate and rhythm management.  She is chronically anticoagulated using rivaroxaban; reported to be compliant with therapy with no evidence or reports of GI bleeding.  Blood pressure well-controlled at 98/66 mmHg on currently prescribed beta-blocker (metoprolol succinate) and diuretic (bumetanide) therapies. She is on atorvastatin for her HLD diagnosis and further ASCVD prevention.  T2DM well-controlled on currently prescribed regimen; last HgbA1c was 5.0% when checked on 11/08/2022.  Patient is not active per baseline, however she is able to complete all ADLs/IADLs without cardiovascular limitation. Functional capacity, as defined by DASI, is documented as being >/= 4 METS.  No changes were made to her medication regimen.  Patient follow-up with outpatient cardiology in 6 months or sooner if needed.  Meghan [name] is scheduled for an elective ARTHRODESIS METATARSALPHALANGEAL JOINT (MTPJ) (Left: Toe) on 02/25/2023 with Dr. Nicholes Rough, DPM.  Given patient's past medical history significant for cardiovascular diagnoses and multiple medical comorbidities, presurgical clearances from patient's internal/family medicine provider and primary cardiology team were sought by the PAT team.  Clearances were obtained as follows:  Per internal/family medicine Meghan Hurst, FNP-C), "patient is okay for surgery medically, however may need clearance from cardiology".  Per cardiology Meghan Hoard, NP-C for Branch, MD), "based ACC/AHA guidelines, the patient's past medical history, and the amount of time since her last clinic visit, this patient would be at an overall ACCEPTABLE risk for the planned procedure without further cardiovascular testing or intervention at this time".   Again, this patient is on daily oral anticoagulation therapy.  She has been instructed on  recommendations for holding her rivaroxaban for 3 days prior to her procedure with plans to restart as soon as postoperative bleeding risk felt to be minimized by her attending surgeon. The patient has been instructed that her last dose of her rivaroxaban should be on 02/21/2023.  Patient denies previous perioperative complications with anesthesia in the past. In review of the available records, it is noted that patient underwent a general anesthetic course at [facility] ([facility]) in 10/2022 without documented complications.      02/15/2023    2:00 PM 12/31/2022   12:56 PM 12/31/2022   12:53 PM  Vitals with BMI  Height 5\' 5"   5\' 5"   Weight 314 lbs  317 lbs 7 oz  BMI 52.25  52.83  Systolic  128   Diastolic  66   Pulse  62     Providers/Specialists:   NOTE: Primary physician provider listed below. Patient may have been seen by APP or partner within same practice.   PROVIDER ROLE / SPECIALTY LAST OV  Candelaria Stagers, DPM Podiatry (Surgeon) 10/04/2022  Felix Pacini, FNP Primary Care Provider 09/20/2022  Dina Rich, MD Cardiology 12/06/2022; update preop APP call on 02/01/2023  Coralyn Helling, MD Pulmonary Medicine 12/31/2022   Allergies:  Acetaminophen-codeine, Bee venom, Meloxicam, Tramadol, Codeine, Coconut (cocos nucifera), and Tomato  Current Home Medications:   No current facility-administered medications for this encounter.    acetaminophen (TYLENOL) 500 MG tablet   albuterol (VENTOLIN HFA) 108 (90 Base) MCG/ACT inhaler   atorvastatin (LIPITOR) 40 MG tablet   bumetanide (BUMEX) 1 MG tablet   Cholecalciferol (VITAMIN D-3) 125 MCG (5000 UT) TABS   flecainide (TAMBOCOR) 50 MG tablet   levETIRAcetam (KEPPRA) 500 MG tablet   Menthol-Camphor (TIGER BALM ARTHRITIS RUB) 11-11 % CREA   metFORMIN (GLUCOPHAGE) 500 MG tablet   metoprolol succinate (TOPROL-XL) 25 MG 24 hr tablet   Omega-3 Fatty  Acids (FISH OIL) 1200 MG CAPS   omeprazole (PRILOSEC) 40 MG capsule    rivaroxaban (XARELTO) 20 MG TABS tablet   RYBELSUS 14 MG TABS   sertraline (ZOLOFT) 50 MG tablet   History:   Past Medical History:  Diagnosis Date   (HFpEF) heart failure with preserved ejection fraction (HCC) 01/2019   a.) Dx'd in [place]; b.) TTE 08/14/2022: EF 60-65%, mod LVH, mild LAE, triv MR   Anxiety    Asthma    Atrial fibrillation and flutter (HCC) 02/16/2019   a.) CHA2DS2VASc = 6 (sex, HFpEF, HTN, CVA x2, T2DM);  b.) s/p DCCV (200 J x 1) 08/22/2022; c.) s/p RF ablation (CTI) 11/02/2022; d.) rate/rhythm maintained on oral flecainide + metoprolol succinate; chronically anticoagulated with rivaroxaban   Class 3 severe obesity due to excess calories with body mass index (BMI) of 50.0 to 59.9 in adult [facility])    DDD (degenerative disc disease), lumbar    GERD (gastroesophageal reflux disease)    Hiatal hernia    History of head injury    HTN (hypertension)    Hypercholesterolemia    Ileus (HCC)    Intractable nausea and vomiting 11/16/2020   Long term current use of anticoagulant    a.) rivaroxaban   Macular degeneration of left eye    MDD (major depressive disorder)    OA (osteoarthritis)    OSA on CPAP    Pneumonia    Presence of IVC filter    PTSD (post-traumatic stress disorder)    Recurrent pulmonary emboli (HCC)    a.) s/p IVC filter placement 2003; b.) previously followed by hematology when living in [place], [place]   Seizure disorder [facility])    a.) last was in 2020 per pt report   Stroke (HCC)    1989 and 1995 (left sided weakness)   T2DM (type 2 diabetes mellitus) (HCC)    Vitamin D deficiency    Past Surgical History:  Procedure Laterality Date   A-FLUTTER ABLATION N/A 11/02/2022   Procedure: A-FLUTTER ABLATION;  Surgeon: Mealor, Roberts Gaudy, MD;  [facility];  Service: Cardiovascular;  Laterality: N/A;   CARDIAC CATHETERIZATION  2017   in [facility] in [place]   CARDIOVERSION N/A 08/22/2022   Procedure: CARDIOVERSION;   Surgeon: Thomasene Ripple, DO;  Location: [facility] ENDOSCOPY;  Service: Cardiovascular;  Laterality: N/A;   CESAREAN SECTION N/A 1989   CESAREAN SECTION N/A 1993   CHOLECYSTECTOMY  2003   COLONOSCOPY WITH PROPOFOL N/A 06/05/2019   Procedure: COLONOSCOPY WITH PROPOFOL;  Surgeon: Jeani Hawking, MD;  Location: [facility] ENDOSCOPY;  Service: Endoscopy;  Laterality: N/A;   CYST REMOVAL WITH BONE GRAFT Left 10/18/2020   Procedure: BONE GRAFTING OF ENCHONDROMA MIDDLE PHANLANX OF LEFT MIDDLE FINGER;  Surgeon: Cindee Salt, MD;  Location: [facility] OR;  Service: Orthopedics;  Laterality: Left;  AXILLARY BLOCK   DIAGNOSTIC LAPAROSCOPY  2015; 2017   lap hernia repair x2   ENDOVENOUS ABLATION SAPHENOUS VEIN W/ LASER Left 08/31/2021   endovenous laser ablation left greater saphenous vein and stab phlebectomy 10-20 incisions left leg by Lemar Livings MD   HALLUX FUSION Right 05/28/2022   Procedure: HALLUX FUSION METATARSAL PHALANGEAL JOIT;  Surgeon: Candelaria Stagers, DPM;  Location: [facility];  Service: Podiatry;  Laterality: Right;  BLOCK   HERNIA REPAIR  2014, 2018   umbilical hernia repair   IVC FILTER INSERTION  2003   POLYPECTOMY  06/05/2019   Procedure: POLYPECTOMY;  Surgeon: Jeani Hawking, MD;  Location: [facility] ENDOS[facility];  Service: Endoscopy;;   SHOULDER ARTHROSCOPY W/ ROTATOR CUFF REPAIR Right 08/28/2017   TOTAL ABDOMINAL HYSTERECTOMY  2003   UPPER GI ENDOSCOPY     WRIST ARTHROSCOPY WITH DEBRIDEMENT Left 10/18/2020   Procedure: LEFT WRIST ARTHROSCOPY WITH DEBRIDEMENT;  Surgeon: Cindee Salt, MD;  Location: [facility] OR;  Service: Orthopedics;  Laterality: Left;  AXILLARY BLOCK   WRIST ARTHROSCOPY WITH DEBRIDEMENT Right 01/25/2022   Procedure: ARTHROSCOPY RIGHT WRIST WITH  DEBRIDEMENT/ SHRINKAGE;  Surgeon: Betha Loa, MD;  Location: [facility] OR;  Service: Orthopedics;  Laterality: Right;   Family History  Problem Relation Age of Onset   Breast cancer Mother    Heart attack Father    Diabetes Father    Congestive Heart  Failure Father    Breast cancer Maternal Grandmother    Social History   Tobacco Use   Smoking status: Never   Smokeless tobacco: Never  Vaping Use   Vaping Use: Never used  Substance Use Topics   Alcohol use: Never   Drug use: Never    Pertinent Clinical Results:  LABS:   No visits with results within 3 Day(s) from this visit.  Latest known visit with results is:  Hospital Outpatient Visit on 02/19/2023  Component Date Value Ref Range Status   Sodium 02/19/2023 139  135 - 145 mmol/L Final   Potassium 02/19/2023 3.7  3.5 - 5.1 mmol/L Final   Chloride 02/19/2023 103  98 - 111 mmol/L Final   CO2 02/19/2023 27  22 - 32 mmol/L Final   Glucose, Bld 02/19/2023 91  70 - 99 mg/dL Final   Glucose reference range applies only to samples taken after fasting for at least 8 hours.   BUN 02/19/2023 15  6 - 20 mg/dL Final   Creatinine, Ser 02/19/2023 0.78  0.44 - 1.00 mg/dL Final   Calcium 91/47/8295 10.2  8.9 - 10.3 mg/dL Final   GFR, Estimated 02/19/2023 >60  >60 mL/min Final   Comment: (NOTE) Calculated using the CKD-EPI Creatinine Equation (2021)    Anion gap 02/19/2023 9  5 - 15 Final   Performed at [facility], [address] [place], [place] [address]   Lab Results  Component Value Date   WBC 6.6 12/31/2022   HGB 11.0 (L) 12/31/2022   HCT 35.2 (L) 12/31/2022   MCV 86.1 12/31/2022   PLT 245 12/31/2022    ECG: Date: 12/06/2022 Time ECG obtained: 0952 AM Rate: 63 bpm Rhythm:  Sinus rhythm with first-degree AV block Axis (leads I and aVF): Normal Intervals: PR 222 ms. QRS 88 ms. QTc 413 ms. ST segment and T wave changes: No evidence of acute ST segment elevation or depression Comparison: Similar to previous tracing obtained on 11/14/2022   IMAGING / PROCEDURES: TRANSTHORACIC ECHOCARDIOGRAM performed on 08/14/2022 Left ventricular ejection fraction, by estimation, is 60 to 65%. The left ventricle has normal function. The left ventricle has no regional wall  motion abnormalities. There is moderate left ventricular hypertrophy. Left ventricular diastolic  parameters are indeterminate.  Right ventricular systolic function is normal. The right ventricular size is normal.  Left atrial size was mildly dilated.  The mitral valve is normal in structure. Trivial mitral valve regurgitation. No evidence of mitral stenosis.  The aortic valve is tricuspid. Aortic valve regurgitation is not visualized. No aortic stenosis is present.  The inferior vena cava is normal in size with greater than 50% respiratory variability, suggesting right atrial pressure of 3 mmHg.   DIAGNOSTIC RADIOGRAPHS OF  RIGHT FOOT COMPLETE performed on 03/06/2022 Soft tissue swelling in the foot, particularly dorsally. No acute fracture or dislocation. No radiographic evidence of osteomyelitis. Moderate hallux valgus and bunion changes. Mild first MTP joint osteoarthritis.  DIAGNOSTIC RADIOGRAPHS OF LUMBAR SPINE 2-3 VIEWS performed on 11/26/2022 Lower lumbar degenerative disc disease and degenerative facet disease with degenerative anterolisthesis on L4 on L5 of 5 mm.  This could certainly be symptomatic. IVC filter in place.  Impression and Plan:  Meghan [name] has been referred for pre-anesthesia review and clearance prior to her undergoing the planned anesthetic and procedural courses. Available labs, pertinent testing, and imaging results were personally reviewed by me in preparation for upcoming operative/procedural course. [facility] medical record has been updated following extensive record review and patient interview with PAT staff.   This patient has been appropriately cleared by cardiology (ACCEPTABLE) and by internal/family medicine (ACCEPTABLE) with the individually indicated risk of significant perioperative complications. Based on clinical review performed today (02/22/23), barring any significant acute changes in the patient's overall condition, it is anticipated that she  will be able to proceed with the planned surgical intervention. Any acute changes in clinical condition may necessitate her procedure being postponed and/or cancelled. Patient will meet with anesthesia team (MD and/or CRNA) on the day of her procedure for preoperative evaluation/assessment. Questions regarding anesthetic course will be fielded at that time.   Pre-surgical instructions were reviewed with the patient during her PAT appointment, and questions were fielded to satisfaction by PAT clinical staff. She has been instructed on which medications that she will need to hold prior to surgery, as well as the ones that have been deemed safe/appropriate to take on the day of her procedure. As part of the general education provided by PAT, patient made aware both verbally and in writing, that she would need to abstain from the use of any illegal substances during her perioperative course.  She was advised that failure to follow the provided instructions could necessitate case cancellation or result in serious perioperative complications up to and including death. Patient encouraged to contact PAT and/or her surgeon's office to discuss any questions or concerns that may arise prior to surgery; verbalized understanding.   Meghan Mulling, MSN, APRN, FNP-C, CEN [facility]  Peri-operative Services Nurse Practitioner Phone: [phone] Fax: [phone] 02/22/23 7:53 AM  NOTE: This note has been prepared using Dragon dictation software. Despite my best ability to proofread, there is always the potential that unintentional transcriptional errors may still occur from this process.

## 2023-02-20 NOTE — Progress Notes (Signed)
Virtual Visit via Video Note   I connected with Lashia S. Paske on 02/20/23 at 2:00pm by video enabled telemedicine application and verified that I am speaking with the correct person using two identifiers.   I discussed the limitations, risks, security and privacy concerns of performing an evaluation and management service by video and the availability of in person appointments. I also discussed with the patient that there may be a patient responsible charge related to this service. The patient expressed understanding and agreed to proceed.   I discussed the assessment and treatment plan with the patient. The patient was provided an opportunity to ask questions and all were answered. The patient agreed with the plan and demonstrated an understanding of the instructions.   The patient was advised to call back or seek an in-person evaluation if the symptoms worsen or if the condition fails to improve as anticipated.   I provided 1 hour of non-face-to-face time during this encounter.     Noralee Stain, LCSW, LCAS _______________________ THERAPIST PROGRESS NOTE   Session Time:  2:00pm - 3:00pm    Location: Patient: Patient Home Provider: [facility] Office   Participation Level: Active    Behavioral Response: Alert, casually dressed, anxious mood/affect   Type of Therapy:  Individual Therapy   Treatment Goals addressed: Mood management; Medication management; Church attendance   Progress Towards Goals: Progressing    Interventions: CBT; psychoeducation on grief/loss, supportive therapy    Summary: Leisl S. Canet is a 57 year old divorced African American female that presented for therapy appointment today with diagnoses of Major Depressive Disorder, recurrent, moderate, Generalized Anxiety Disorder and PTSD.      Suicidal/Homicidal: None; without plan or intent                                                                                                                Therapist Response:   Clinician met with Nayah for virtual therapy session and assessed for safety, sobriety, and medication compliance.  Brynnley presented for appointment on time and was alert, oriented x5, with no evidence or self-report of active SI/HI or A/V H.  Latrisa reported ongoing compliance with medication and denied any use of alcohol or illicit substances.  Clinician inquired about Annalynn's emotional ratings today, as well as any significant changes in thoughts, feelings, or behavior since last check-in.  Navie reported scores of 0/10 for depression, 10/10 for anxiety, and 10/10 for anger/irritability.  Yaminah denied any recent panic attacks or outbursts.  Zareena denied any recent successes, noting that it has been a difficult week.  Quintasia reported that her doctor informed her that the procedure on her foot will changed due to an issue with the tissues, so she is worried about this.  She reported that she also had two close friends pass away in the span of one week, which has hit her hard.  Clinician expressed sympathy for Dynastee's losses over the past week, and reviewed psychoeducation on the 5 stages of grief with her, including denial, anger, bargaining,  depression, and acceptance.  Clinician discussed how each stage can affect an individual, and provided strategies on how to faciliate healthy grieving as she processes these losses. Strategies provided to [facility] included taking time to allow healthy emotional expression (I.e. allowing oneself to cry when appropriate), engaging in healthy self-care activities for distraction, talking to people who can relate to the loss for support, and considering ways to memorialize the deceased.  Interventions were effective, as evidenced by Emma-Lee's active engagement in discussion on subject, and receptiveness to suggestions offered to improve grieving process. Molley reported that in the past she has compartmentalized these difficult feelings and isolated, which makes things worse.  She reported  that her plan now is to open up to supportive friends and family close to the deceased, and attend a virtual grief share meeting that will be started through her church soon, since group therapy has had a positive effect in the past.  Clinician will continue to monitor.            Plan: Follow up again in 1 week.    Diagnosis: Major depressive disorder, recurrent, moderate; Generalized Anxiety Disorder; and PTSD   Collaboration of Care:   No collaboration of care required for this visit.                                                   Patient/Guardian was advised Release of Information must be obtained prior to any record release in order to collaborate their care with an outside provider. Patient/Guardian was advised if they have not already done so to contact the registration department to sign all necessary forms in order for [name] to release information regarding their care.    Consent: Patient/Guardian gives verbal consent for treatment and assignment of benefits for services provided during this visit. Patient/Guardian expressed understanding and agreed to proceed.  Noralee Stain, LCSW, LCAS 02/20/23

## 2023-02-22 ENCOUNTER — Encounter: Payer: Self-pay | Admitting: Podiatry

## 2023-02-24 MED ORDER — SODIUM CHLORIDE 0.9 % IV SOLN: INTRAVENOUS | Status: DC

## 2023-02-24 MED ORDER — CHLORHEXIDINE GLUCONATE 0.12 % MT SOLN
15.0000 mL | Freq: Once | OROMUCOSAL | Status: AC
  Administered 2023-02-25: 15 mL via OROMUCOSAL

## 2023-02-24 MED ORDER — ORAL CARE MOUTH RINSE: 15.0000 mL | Freq: Once | OROMUCOSAL | Status: AC

## 2023-02-24 MED ORDER — CEFAZOLIN SODIUM-DEXTROSE 2-4 GM/100ML-% IV SOLN
2.0000 g | Freq: Once | INTRAVENOUS | Status: AC
  Administered 2023-02-25: 3 g via INTRAVENOUS

## 2023-02-25 ENCOUNTER — Encounter
Admission: RE | Disposition: A | Discharge status: Home / Self Care | Payer: Self-pay | Source: Home / Self Care | Attending: Podiatry

## 2023-02-25 ENCOUNTER — Ambulatory Visit: Payer: 59

## 2023-02-25 ENCOUNTER — Ambulatory Visit: Payer: 59 | Admitting: Urgent Care

## 2023-02-25 ENCOUNTER — Encounter: Payer: Self-pay | Admitting: Podiatry

## 2023-02-25 ENCOUNTER — Other Ambulatory Visit: Payer: Self-pay

## 2023-02-25 ENCOUNTER — Ambulatory Visit
Admission: RE | Admit: 2023-02-25 | Discharge: 2023-02-25 | Disposition: A | Discharge status: Home / Self Care | Payer: 59 | Attending: Podiatry | Admitting: Podiatry

## 2023-02-25 ENCOUNTER — Other Ambulatory Visit: Payer: Self-pay | Admitting: Podiatry

## 2023-02-25 DIAGNOSIS — Z8673 Personal history of transient ischemic attack (TIA), and cerebral infarction without residual deficits: Secondary | ICD-10-CM | POA: Insufficient documentation

## 2023-02-25 DIAGNOSIS — K449 Diaphragmatic hernia without obstruction or gangrene: Secondary | ICD-10-CM | POA: Insufficient documentation

## 2023-02-25 DIAGNOSIS — G40909 Epilepsy, unspecified, not intractable, without status epilepticus: Secondary | ICD-10-CM | POA: Insufficient documentation

## 2023-02-25 DIAGNOSIS — Z6841 Body Mass Index (BMI) 40.0 and over, adult: Secondary | ICD-10-CM | POA: Diagnosis not present

## 2023-02-25 DIAGNOSIS — I5030 Unspecified diastolic (congestive) heart failure: Secondary | ICD-10-CM | POA: Diagnosis not present

## 2023-02-25 DIAGNOSIS — I110 Hypertensive heart disease with heart failure: Secondary | ICD-10-CM | POA: Diagnosis not present

## 2023-02-25 DIAGNOSIS — I4892 Unspecified atrial flutter: Secondary | ICD-10-CM | POA: Insufficient documentation

## 2023-02-25 DIAGNOSIS — M19072 Primary osteoarthritis, left ankle and foot: Secondary | ICD-10-CM | POA: Insufficient documentation

## 2023-02-25 DIAGNOSIS — F4310 Post-traumatic stress disorder, unspecified: Secondary | ICD-10-CM | POA: Insufficient documentation

## 2023-02-25 DIAGNOSIS — Z09 Encounter for follow-up examination after completed treatment for conditions other than malignant neoplasm: Secondary | ICD-10-CM | POA: Diagnosis not present

## 2023-02-25 DIAGNOSIS — Z7901 Long term (current) use of anticoagulants: Secondary | ICD-10-CM | POA: Diagnosis not present

## 2023-02-25 DIAGNOSIS — Z79899 Other long term (current) drug therapy: Secondary | ICD-10-CM | POA: Insufficient documentation

## 2023-02-25 DIAGNOSIS — M21612 Bunion of left foot: Secondary | ICD-10-CM | POA: Insufficient documentation

## 2023-02-25 DIAGNOSIS — Z01818 Encounter for other preprocedural examination: Secondary | ICD-10-CM

## 2023-02-25 DIAGNOSIS — F419 Anxiety disorder, unspecified: Secondary | ICD-10-CM | POA: Diagnosis not present

## 2023-02-25 DIAGNOSIS — J45909 Unspecified asthma, uncomplicated: Secondary | ICD-10-CM | POA: Diagnosis not present

## 2023-02-25 DIAGNOSIS — I4891 Unspecified atrial fibrillation: Secondary | ICD-10-CM | POA: Insufficient documentation

## 2023-02-25 DIAGNOSIS — K219 Gastro-esophageal reflux disease without esophagitis: Secondary | ICD-10-CM | POA: Diagnosis not present

## 2023-02-25 DIAGNOSIS — E119 Type 2 diabetes mellitus without complications: Secondary | ICD-10-CM | POA: Diagnosis not present

## 2023-02-25 DIAGNOSIS — G4733 Obstructive sleep apnea (adult) (pediatric): Secondary | ICD-10-CM | POA: Insufficient documentation

## 2023-02-25 DIAGNOSIS — E785 Hyperlipidemia, unspecified: Secondary | ICD-10-CM | POA: Insufficient documentation

## 2023-02-25 DIAGNOSIS — Z7984 Long term (current) use of oral hypoglycemic drugs: Secondary | ICD-10-CM | POA: Insufficient documentation

## 2023-02-25 HISTORY — DX: Long term (current) use of anticoagulants: Z79.01

## 2023-02-25 HISTORY — DX: Unspecified osteoarthritis, unspecified site: M19.90

## 2023-02-25 HISTORY — PX: ARTHRODESIS METATARSALPHALANGEAL JOINT (MTPJ): SHX6566

## 2023-02-25 HISTORY — DX: Other intervertebral disc degeneration, lumbar region without mention of lumbar back pain or lower extremity pain: M51.369

## 2023-02-25 HISTORY — DX: Type 2 diabetes mellitus without complications: E11.9

## 2023-02-25 HISTORY — DX: Unspecified macular degeneration: H35.30

## 2023-02-25 HISTORY — DX: Other intervertebral disc degeneration, lumbar region: M51.36

## 2023-02-25 HISTORY — DX: Essential (primary) hypertension: I10

## 2023-02-25 HISTORY — DX: Other pulmonary embolism without acute cor pulmonale: I26.99

## 2023-02-25 LAB — GLUCOSE, CAPILLARY
Glucose-Capillary: 104 mg/dL — ABNORMAL HIGH (ref 70–99)
Glucose-Capillary: 111 mg/dL — ABNORMAL HIGH (ref 70–99)

## 2023-02-25 SURGERY — FUSION, JOINT, GREAT TOE
Anesthesia: General | Site: Toe | Laterality: Left

## 2023-02-25 MED ORDER — FENTANYL CITRATE (PF) 100 MCG/2ML IJ SOLN
25.0000 ug | INTRAMUSCULAR | Status: DC | PRN
  Administered 2023-02-25 (×2): 50 ug via INTRAVENOUS

## 2023-02-25 MED ORDER — CEFAZOLIN SODIUM-DEXTROSE 2-4 GM/100ML-% IV SOLN
INTRAVENOUS | Status: AC
  Filled 2023-02-25: qty 100

## 2023-02-25 MED ORDER — OXYCODONE HCL 5 MG PO TABS
5.0000 mg | ORAL_TABLET | Freq: Once | ORAL | Status: AC | PRN
  Administered 2023-02-25: 5 mg via ORAL

## 2023-02-25 MED ORDER — ACETAMINOPHEN 10 MG/ML IV SOLN
1000.0000 mg | Freq: Once | INTRAVENOUS | Status: DC | PRN
  Administered 2023-02-25: 1000 mg via INTRAVENOUS

## 2023-02-25 MED ORDER — GLYCOPYRROLATE 0.2 MG/ML IJ SOLN
INTRAMUSCULAR | Status: DC | PRN
  Administered 2023-02-25: .1 mg via INTRAVENOUS

## 2023-02-25 MED ORDER — DEXAMETHASONE SODIUM PHOSPHATE 10 MG/ML IJ SOLN
INTRAMUSCULAR | Status: AC
  Filled 2023-02-25: qty 1

## 2023-02-25 MED ORDER — ONDANSETRON HCL 4 MG/2ML IJ SOLN
INTRAMUSCULAR | Status: AC
  Filled 2023-02-25: qty 2

## 2023-02-25 MED ORDER — MIDAZOLAM HCL 2 MG/2ML IJ SOLN
INTRAMUSCULAR | Status: AC
  Filled 2023-02-25: qty 2

## 2023-02-25 MED ORDER — BUPIVACAINE LIPOSOME 1.3 % IJ SUSP
INTRAMUSCULAR | Status: AC
  Filled 2023-02-25: qty 20

## 2023-02-25 MED ORDER — MIDAZOLAM HCL 2 MG/2ML IJ SOLN
2.0000 mg | Freq: Once | INTRAMUSCULAR | Status: AC
  Administered 2023-02-25: 2 mg via INTRAVENOUS

## 2023-02-25 MED ORDER — SODIUM CHLORIDE (PF) 0.9 % IJ SOLN
INTRAMUSCULAR | Status: AC
  Filled 2023-02-25: qty 10

## 2023-02-25 MED ORDER — BUPIVACAINE HCL (PF) 0.5 % IJ SOLN
INTRAMUSCULAR | Status: AC
  Filled 2023-02-25: qty 30

## 2023-02-25 MED ORDER — DEXAMETHASONE SODIUM PHOSPHATE 10 MG/ML IJ SOLN
INTRAMUSCULAR | Status: DC | PRN
  Administered 2023-02-25: 10 mg via INTRAVENOUS

## 2023-02-25 MED ORDER — SUCCINYLCHOLINE CHLORIDE 200 MG/10ML IV SOSY
PREFILLED_SYRINGE | INTRAVENOUS | Status: DC | PRN
  Administered 2023-02-25: 180 mg via INTRAVENOUS

## 2023-02-25 MED ORDER — OXYCODONE-ACETAMINOPHEN 5-325 MG PO TABS: 1.0000 | ORAL_TABLET | ORAL | 0 refills | Status: DC | PRN

## 2023-02-25 MED ORDER — FENTANYL CITRATE (PF) 100 MCG/2ML IJ SOLN
INTRAMUSCULAR | Status: DC | PRN
  Administered 2023-02-25 (×2): 50 ug via INTRAVENOUS

## 2023-02-25 MED ORDER — LIDOCAINE HCL (CARDIAC) PF 100 MG/5ML IV SOSY
PREFILLED_SYRINGE | INTRAVENOUS | Status: DC | PRN
  Administered 2023-02-25: 100 mg via INTRAVENOUS

## 2023-02-25 MED ORDER — ONDANSETRON HCL 4 MG/2ML IJ SOLN: 4.0000 mg | Freq: Once | INTRAMUSCULAR | Status: DC | PRN

## 2023-02-25 MED ORDER — CHLORHEXIDINE GLUCONATE 0.12 % MT SOLN
OROMUCOSAL | Status: AC
  Filled 2023-02-25: qty 15

## 2023-02-25 MED ORDER — ROCURONIUM BROMIDE 10 MG/ML (PF) SYRINGE
PREFILLED_SYRINGE | INTRAVENOUS | Status: AC
  Filled 2023-02-25: qty 10

## 2023-02-25 MED ORDER — OXYCODONE HCL 5 MG PO TABS
ORAL_TABLET | ORAL | Status: AC
  Filled 2023-02-25: qty 1

## 2023-02-25 MED ORDER — 0.9 % SODIUM CHLORIDE (POUR BTL) OPTIME
TOPICAL | Status: DC | PRN
  Administered 2023-02-25: 500 mL

## 2023-02-25 MED ORDER — DEXMEDETOMIDINE HCL IN NACL 200 MCG/50ML IV SOLN
INTRAVENOUS | Status: DC | PRN
  Administered 2023-02-25 (×2): 8 ug via INTRAVENOUS

## 2023-02-25 MED ORDER — DEXMEDETOMIDINE HCL IN NACL 80 MCG/20ML IV SOLN
INTRAVENOUS | Status: AC
  Filled 2023-02-25: qty 20

## 2023-02-25 MED ORDER — OXYCODONE HCL 5 MG/5ML PO SOLN: 5.0000 mg | Freq: Once | ORAL | Status: AC | PRN

## 2023-02-25 MED ORDER — CEFAZOLIN SODIUM 1 G IJ SOLR
INTRAMUSCULAR | Status: AC
  Filled 2023-02-25: qty 10

## 2023-02-25 MED ORDER — SUGAMMADEX SODIUM 200 MG/2ML IV SOLN
INTRAVENOUS | Status: DC | PRN
  Administered 2023-02-25: 200 mg via INTRAVENOUS

## 2023-02-25 MED ORDER — ACETAMINOPHEN 10 MG/ML IV SOLN
INTRAVENOUS | Status: AC
  Filled 2023-02-25: qty 100

## 2023-02-25 MED ORDER — DROPERIDOL 2.5 MG/ML IJ SOLN
INTRAMUSCULAR | Status: AC
  Filled 2023-02-25: qty 2

## 2023-02-25 MED ORDER — PROPOFOL 10 MG/ML IV BOLUS
INTRAVENOUS | Status: DC | PRN
  Administered 2023-02-25: 200 mg via INTRAVENOUS

## 2023-02-25 MED ORDER — FENTANYL CITRATE (PF) 100 MCG/2ML IJ SOLN
INTRAMUSCULAR | Status: AC
  Filled 2023-02-25: qty 2

## 2023-02-25 MED ORDER — BUPIVACAINE LIPOSOME 1.3 % IJ SUSP
INTRAMUSCULAR | Status: DC | PRN
  Administered 2023-02-25 (×2): 10 mL

## 2023-02-25 MED ORDER — BUPIVACAINE HCL (PF) 0.5 % IJ SOLN
INTRAMUSCULAR | Status: DC | PRN
  Administered 2023-02-25 (×2): 10 mL

## 2023-02-25 MED ORDER — LACTATED RINGERS IV SOLN: INTRAVENOUS | Status: DC | PRN

## 2023-02-25 MED ORDER — SUCCINYLCHOLINE CHLORIDE 200 MG/10ML IV SOSY
PREFILLED_SYRINGE | INTRAVENOUS | Status: AC
  Filled 2023-02-25: qty 10

## 2023-02-25 MED ORDER — ONDANSETRON HCL 4 MG/2ML IJ SOLN
INTRAMUSCULAR | Status: DC | PRN
  Administered 2023-02-25: 4 mg via INTRAVENOUS

## 2023-02-25 MED ORDER — BUPIVACAINE HCL (PF) 0.5 % IJ SOLN
INTRAMUSCULAR | Status: AC
  Filled 2023-02-25: qty 20

## 2023-02-25 MED ORDER — DROPERIDOL 2.5 MG/ML IJ SOLN
0.6250 mg | Freq: Once | INTRAMUSCULAR | Status: AC
  Administered 2023-02-25: 0.625 mg via INTRAVENOUS

## 2023-02-25 MED ORDER — ROCURONIUM BROMIDE 100 MG/10ML IV SOLN
INTRAVENOUS | Status: DC | PRN
  Administered 2023-02-25: 50 mg via INTRAVENOUS

## 2023-02-25 MED ORDER — GLYCOPYRROLATE 0.2 MG/ML IJ SOLN
INTRAMUSCULAR | Status: AC
  Filled 2023-02-25: qty 1

## 2023-02-25 MED ORDER — PROPOFOL 10 MG/ML IV BOLUS
INTRAVENOUS | Status: AC
  Filled 2023-02-25: qty 40

## 2023-02-25 MED ORDER — PROPOFOL 1000 MG/100ML IV EMUL
INTRAVENOUS | Status: AC
  Filled 2023-02-25: qty 100

## 2023-02-25 MED ORDER — LIDOCAINE HCL (PF) 2 % IJ SOLN
INTRAMUSCULAR | Status: AC
  Filled 2023-02-25: qty 5

## 2023-02-25 SURGICAL SUPPLY — 67 items
APL PRP STRL LF DISP 70% ISPRP (MISCELLANEOUS) ×1
BIT DRILL AT3 STD AC 3 (DRILL) IMPLANT
BIT DRILL LEOS 2.4 (BIT) IMPLANT
BIT DRILL LEOS SN 2.0 (DRILL) IMPLANT
BIT DRILL MEMOFIX 2.7 (BIT) IMPLANT
BLADE SURG 15 STRL LF DISP TIS (BLADE) ×2 IMPLANT
BLADE SURG 15 STRL SS (BLADE) ×2
BLADE SURG MINI STRL (BLADE) IMPLANT
BNDG CMPR STD VLCR NS LF 5.8X4 (GAUZE/BANDAGES/DRESSINGS) ×1
BNDG ELASTIC 4X5.8 VLCR NS LF (GAUZE/BANDAGES/DRESSINGS) ×1 IMPLANT
BNDG ESMARCH 4 X 12 STRL LF (GAUZE/BANDAGES/DRESSINGS)
BNDG ESMARCH 4X12 STRL LF (GAUZE/BANDAGES/DRESSINGS) ×1 IMPLANT
BNDG GAUZE DERMACEA FLUFF 4 (GAUZE/BANDAGES/DRESSINGS) ×1 IMPLANT
BNDG GZE DERMACEA 4 6PLY (GAUZE/BANDAGES/DRESSINGS)
CHLORAPREP W/TINT 26 (MISCELLANEOUS) ×1 IMPLANT
CUFF TOURN SGL QUICK 12 (TOURNIQUET CUFF) IMPLANT
CUFF TOURN SGL QUICK 18X4 (TOURNIQUET CUFF) IMPLANT
CUFF TOURN SGL QUICK 24 (TOURNIQUET CUFF) ×1
CUFF TRNQT CYL 24X4X40X1 (TOURNIQUET CUFF) IMPLANT
DRAPE FLUOR MINI C-ARM 54X84 (DRAPES) IMPLANT
DRILL AT3 STD AC 3 (DRILL) ×1
DRILL LEOS SN 2.0 (DRILL) ×1
ELECT BLADE 4 ULTRACLEAN (MISCELLANEOUS) ×1
ELECT REM PT RETURN 9FT ADLT (ELECTROSURGICAL) ×1
ELECTRODE BLADE 4 ULTRACLEAN (MISCELLANEOUS) ×1 IMPLANT
ELECTRODE REM PT RTRN 9FT ADLT (ELECTROSURGICAL) ×1 IMPLANT
GAUZE SPONGE 4X4 12PLY STRL (GAUZE/BANDAGES/DRESSINGS) ×1 IMPLANT
GAUZE XEROFORM 1X8 LF (GAUZE/BANDAGES/DRESSINGS) ×1 IMPLANT
GLOVE BIO SURGEON STRL SZ7.5 (GLOVE) ×1 IMPLANT
GLOVE BIOGEL PI IND STRL 7.0 (GLOVE) ×1 IMPLANT
GLOVE SURG SYN 7.0 (GLOVE) ×1 IMPLANT
GLOVE SURG SYN 7.0 PF PI (GLOVE) ×1 IMPLANT
GOWN STRL REUS W/ TWL XL LVL3 (GOWN DISPOSABLE) ×2 IMPLANT
GOWN STRL REUS W/TWL XL LVL3 (GOWN DISPOSABLE) ×2
KIT TURNOVER KIT A (KITS) ×1 IMPLANT
KWIRE 0.062 THREADED (WIRE) IMPLANT
LABEL OR SOLS (LABEL) ×1 IMPLANT
MANIFOLD NEPTUNE II (INSTRUMENTS) ×1 IMPLANT
NDL FILTER BLUNT 18X1 1/2 (NEEDLE) ×1 IMPLANT
NDL HYPO 25X1 1.5 SAFETY (NEEDLE) ×3 IMPLANT
NEEDLE FILTER BLUNT 18X1 1/2 (NEEDLE) IMPLANT
NEEDLE HYPO 25X1 1.5 SAFETY (NEEDLE) ×2 IMPLANT
NS IRRIG 500ML POUR BTL (IV SOLUTION) ×1 IMPLANT
PACK EXTREMITY ARMC (MISCELLANEOUS) ×1 IMPLANT
PAD ABD DERMACEA PRESS 5X9 (GAUZE/BANDAGES/DRESSINGS) ×1 IMPLANT
PAD CAST 4YDX4 CTTN HI CHSV (CAST SUPPLIES) ×1 IMPLANT
PADDING CAST COTTON 4X4 STRL (CAST SUPPLIES) ×1
PLATE METAPH LEOS SM 0D LT (Screw) IMPLANT
PLATE TACK LEOS 20 (WIRE) IMPLANT
SCREW LEOS NL 3.5X16 (Screw) IMPLANT
SCREW LOCK 2.7X12 (Screw) IMPLANT
SCREW LOCK VA LEOS 3.5X12 (Screw) IMPLANT
SCREW NLOCK 2.7X12 (Screw) ×1 IMPLANT
SCREW NONLOCK 2.7X12 (Screw) IMPLANT
STAPLE MEMOFIX NIT 20X15X15 (Staple) IMPLANT
STOCKINETTE M/LG 89821 (MISCELLANEOUS) ×1 IMPLANT
SUT MNCRL AB 3-0 PS2 27 (SUTURE) ×1 IMPLANT
SUT MNCRL AB 4-0 PS2 18 (SUTURE) ×1 IMPLANT
SUT MNCRL+ 5-0 UNDYED PC-3 (SUTURE) ×1 IMPLANT
SUT PROLENE 3 0 PS 2 (SUTURE) IMPLANT
SUT PROLENE 4 0 PS 2 18 (SUTURE) IMPLANT
SYR 10ML LL (SYRINGE) ×2 IMPLANT
TACK PLATE LEOS 20 (WIRE) ×2
TRAP FLUID SMOKE EVACUATOR (MISCELLANEOUS) ×1 IMPLANT
WATER STERILE IRR 500ML POUR (IV SOLUTION) ×1 IMPLANT
WIRE GUIDE SINGLE TROC 1.1X150 (WIRE) IMPLANT
WIRE Z .062 C-WIRE SPADE TIP (WIRE) IMPLANT

## 2023-02-25 NOTE — Discharge Instructions (Addendum)
AMBULATORY SURGERY  DISCHARGE INSTRUCTIONS   The drugs that you were given will stay in your system until tomorrow so for the next 24 hours you should not:  Drive an automobile Make any legal decisions Drink any alcoholic beverage   You may resume regular meals tomorrow.  Today it is better to start with liquids and gradually work up to solid foods.  You may eat anything you prefer, but it is better to start with liquids, then soup and crackers, and gradually work up to solid foods.   Please notify your doctor immediately if you have any unusual bleeding, trouble breathing, redness and pain at the surgery site, drainage, fever, or pain not relieved by medication.    Additional Instructions:   Please contact your physician with any problems or Same Day Surgery at [phone], Monday through Friday 6 am to 4 pm, or [facility] number at [phone].   Peripheral Nerve Block (Lower Extremity) Discharge Instructions    For your surgery you have received a femoral Nerve Block.  Your Nerve Block is expected to last for about 4 to 12 hours.  This is an estimated time frame; the results of your nerve block may wear off sooner or may last longer.  If needed, your surgeon will give you a prescription for pain medication.  It will take about 60 minutes for the oral pain medication to become fully effective.  So, it is recommended that you start taking this medication before the nerve block first begins to wear off, or when you first begin to feel discomfort.  Keep in mind that nerve blocks often wear off in the middle of the night.   If you are going to bed and the block has not started to wear off or you have not started to have any discomfort, consider setting an alarm for 2 to 3 hours, so you can assess your block.  If you notice the block is wearing off or you are starting to have discomfort, you can take your pain medication.  Take your pain medication only as  prescribed.  Pain medication can cause sedation and decrease your breathing if you take more than you need for the level of pain that you have.  Nausea is a common side effect of many pain medications.  You may want to eat something before taking your pain medicine to prevent nausea.  After a Peripheral Nerve block, you cannot feel pain, pressure or extremes in temperature in the effected leg.  Because your leg is numb it is at an increased risk for injury.  To decrease the possibility of injury, please practice the following:  While you are awake change the position of your leg frequently to prevent too much pressure on any one area for prolonged periods of time.   If you have a cast or tight dressing, check the color or your toes every couple of hours.  Call your surgeon with the appearance of any discoloration (white or blue).  You may have difficulty bearing weight on the effected leg.  Have someone assist you with walking until the nerve block has completely worn off.   If you surgeon prescribed a brace to be worn after surgery, DO NOT GET UP AT NIGHT WITHOUT YOUR BRACE.  If your surgeon has restricted the amount of weight you should bear on the effected leg, i.e. No Weight, Partial Weight, or Touch Down Only, DO NOT BEAR MORE WEIGHT THAN INSTRUCTED.   If you experience  any problems or concerns, please contact your surgeon.

## 2023-02-25 NOTE — Anesthesia Procedure Notes (Signed)
Anesthesia Regional Block: Popliteal block   Pre-Anesthetic Checklist: , timeout performed,  Correct Patient, Correct Site, Correct Laterality,  Correct Procedure, Correct Position, site marked,  Risks and benefits discussed,  Surgical consent,  Pre-op evaluation,  At surgeon's request and post-op pain management  Laterality: Lower and Left  Prep: chloraprep       Needles:  Injection technique: Single-shot  Needle Type: Echogenic Needle     Needle Length: 9cm  Needle Gauge: 21     Additional Needles:   Procedures:,,,, ultrasound used (permanent image in chart),,    Narrative:  Injection made incrementally with aspirations every 5 mL.  Performed by: Personally  Anesthesiologist: Corinda Gubler, MD  Additional Notes: Patient's chart reviewed and they were deemed appropriate candidate for procedure, at surgeon's request. Patient educated about risks, benefits, and alternatives of the block including but not limited to: temporary or permanent nerve damage, bleeding, infection, damage to surround tissues, block failure, local anesthetic toxicity. Patient expressed understanding. A formal time-out was conducted consistent with institution rules.  Monitors were applied, and minimal sedation used. The site was prepped with skin prep and allowed to dry, and sterile gloves were used. A high frequency linear ultrasound probe with probe cover was utilized throughout. Popliteal artery pulsatile and visualized in popliteal fossa along with adjacent sciatic nerve and its branch point, which appeared anatomically normal, local anesthetic injected around them just proximal to the branch point, and echogenic block needle trajectory was monitored throughout. Aspiration performed every 5ml. Blood vessels were avoided. All injections were performed without resistance and free of blood and paresthesias. The patient tolerated the procedure well.  Injectate: 10ml exparel (surgeon request) + 10ml 0.5%  bupivacaine

## 2023-02-25 NOTE — Interval H&P Note (Signed)
History and Physical Interval Note:  02/25/2023 7:22 AM  Meghan [name]  has presented today for surgery, with the diagnosis of ARTHRITIS LEFT FOOT.  The various methods of treatment have been discussed with the patient and family. After consideration of risks, benefits and other options for treatment, the patient has consented to  Procedure(s): ARTHRODESIS METATARSALPHALANGEAL JOINT (MTPJ) (Left) as a surgical intervention.  The patient's history has been reviewed, patient examined, no change in status, stable for surgery.  I have reviewed the patient's chart and labs.  Questions were answered to the patient's satisfaction.     Candelaria Stagers

## 2023-02-25 NOTE — Transfer of Care (Signed)
Immediate Anesthesia Transfer of Care Note  Patient: Meghan [name]  Procedure(s) Performed: ARTHRODESIS METATARSALPHALANGEAL JOINT (MTPJ) (Left: Toe)  Patient Location: PACU  Anesthesia Type:General  Level of Consciousness: awake, drowsy, patient cooperative, and responds to stimulation  Airway & Oxygen Therapy: Patient Spontanous Breathing and Patient connected to face mask oxygen  Post-op Assessment: Report given to RN, Post -op Vital signs reviewed and stable, and Patient moving all extremities  Post vital signs: Reviewed and stable  Last Vitals:  Vitals Value Taken Time  BP 114/53 02/25/23 0919  Temp    Pulse 57 02/25/23 0919  Resp 19 02/25/23 0919  SpO2 100 % 02/25/23 0919    Last Pain:  Vitals:   02/25/23 0734  TempSrc:   PainSc: 0-No pain         Complications: There were no known notable events for this encounter.

## 2023-02-25 NOTE — Anesthesia Preprocedure Evaluation (Signed)
Anesthesia Evaluation  Patient identified by MRN, date of birth, ID band Patient awake    Reviewed: Allergy & Precautions, NPO status , Patient's Chart, lab work & pertinent test results  History of Anesthesia Complications Negative for: history of anesthetic complications  Airway Mallampati: II  TM Distance: >3 FB Neck ROM: Full    Dental no notable dental hx. (+) Poor Dentition, Missing   Pulmonary asthma , sleep apnea and Continuous Positive Airway Pressure Ventilation , neg COPD, Patient abstained from smoking.Not current smoker   Pulmonary exam normal breath sounds clear to auscultation       Cardiovascular Exercise Tolerance: Good METShypertension, Pt. on medications (-) CAD and (-) Past MI (-) dysrhythmias  Rhythm:Regular Rate:Normal - Systolic murmurs    Neuro/Psych Seizures -, Well Controlled,  PSYCHIATRIC DISORDERS Anxiety Depression    CVA, No Residual Symptoms    GI/Hepatic ,GERD  Controlled,,(+)     (-) substance abuse    Endo/Other  diabetes  Morbid obesityOn daily oral GLP1, held appropriately  Renal/GU negative Renal ROS     Musculoskeletal   Abdominal  (+) + obese  Peds  Hematology   Anesthesia Other Findings Past Medical History: 01/2019: (HFpEF) heart failure with preserved ejection fraction (HCC)     Comment:  a.) Dx'd in [place], [place]; b.) TTE 08/14/2022: EF               60-65%, mod LVH, mild LAE, triv MR No date: Anxiety No date: Asthma 02/16/2019: Atrial fibrillation and flutter (HCC)     Comment:  a.) CHA2DS2VASc = 6 (sex, HFpEF, HTN, CVA x2, T2DM);                b.) s/p DCCV (200 J x 1) 08/22/2022; c.) s/p RF ablation               (CTI) 11/02/2022; d.) rate/rhythm maintained on oral               flecainide + metoprolol succinate; chronically               anticoagulated with rivaroxaban No date: Class 3 severe obesity due to excess calories with body mass  index (BMI) of 50.0 to  59.9 in adult [facility]) No date: DDD (degenerative disc disease), lumbar No date: GERD (gastroesophageal reflux disease) No date: Hiatal hernia No date: History of head injury No date: HTN (hypertension) No date: Hypercholesterolemia No date: Ileus (HCC) 11/16/2020: Intractable nausea and vomiting No date: Long term current use of anticoagulant     Comment:  a.) rivaroxaban No date: Macular degeneration of left eye No date: MDD (major depressive disorder) No date: OA (osteoarthritis) No date: OSA on CPAP No date: Pneumonia No date: Presence of IVC filter No date: PTSD (post-traumatic stress disorder) No date: Recurrent pulmonary emboli (HCC)     Comment:  a.) s/p IVC filter placement 2003; b.) previously               followed by hematology when living in [place], [place] No date: Seizure disorder (HCC)     Comment:  a.) last was in 2020 per pt report No date: Stroke [facility])     Comment:  1989 and 1995 (left sided weakness) No date: T2DM (type 2 diabetes mellitus) (HCC) No date: Vitamin D deficiency  Reproductive/Obstetrics                             Anesthesia Physical Anesthesia  Plan  ASA: 3  Anesthesia Plan: General   Post-op Pain Management: Regional block* and Ofirmev IV (intra-op)*   Induction: Intravenous  PONV Risk Score and Plan: 3 and Ondansetron, Dexamethasone and Midazolam  Airway Management Planned: Oral ETT and Video Laryngoscope Planned  Additional Equipment: None  Intra-op Plan:   Post-operative Plan: Extubation in OR  Informed Consent: I have reviewed the patients History and Physical, chart, labs and discussed the procedure including the risks, benefits and alternatives for the proposed anesthesia with the patient or authorized representative who has indicated his/her understanding and acceptance.     Dental advisory given  Plan Discussed with: CRNA and Surgeon  Anesthesia Plan Comments: (Discussed risks of anesthesia with  patient, including PONV, sore throat, lip/dental/eye damage. Rare risks discussed as well, such as cardiorespiratory and neurological sequelae, and allergic reactions. Discussed the role of CRNA in patient's perioperative care. Patient understands. Patient informed about increased incidence of above perioperative risk due to high BMI. Patient understands.  Discussed r/b/a of adductor canal and popliteal nerve block, including:  - bleeding, infection, nerve damage - poor or non functioning block. - reactions and toxicity to local anesthetic Patient understands. )       Anesthesia Quick Evaluation

## 2023-02-25 NOTE — Op Note (Signed)
Surgeon: Surgeon(s): Candelaria Stagers, DPM  Assistants: None Pre-operative diagnosis: ARTHRITIS LEFT FOOT  Post-operative diagnosis: same Procedure: Procedure(s) (LRB): ARTHRODESIS METATARSALPHALANGEAL JOINT (MTPJ) (Left)  Pathology: * No specimens in log *  Pertinent Intra-op findings: Moderate arthritis noted the first metatarsophalangeal joint with severe bunion deformity Anesthesia: General  Hemostasis:  Total Tourniquet Time Documented: Calf (Left) - 57 minutes Total: Calf (Left) - 57 minutes  EBL: 10 mL  Materials: [employer] plate screws with staple Injectables: 10 cc of half percent Marcaine plain Complications: None  Indications for surgery: A 57 y.o. female presents with painful left first metatarsophalangeal joint bunion with underlying arthritis. Patient has failed all conservative therapy including but not limited to injection padding shoe gear modification. She wishes to have surgical correction of the foot/deformity. It was determined that patient would benefit from left first metatarsophalangeal joint fusion. Informed surgical risk consent was reviewed and read aloud to the patient.  I reviewed the films.  I have discussed my findings with the patient in great detail.  I have discussed all risks including but not limited to infection, stiffness, scarring, limp, disability, deformity, damage to blood vessels and nerves, numbness, poor healing, need for braces, arthritis, chronic pain, amputation, death.  All benefits and realistic expectations discussed in great detail.  I have made no promises as to the outcome.  I have provided realistic expectations.  I have offered the patient a 2nd opinion, which they have declined and assured me they preferred to proceed despite the risks   Procedure in detail: The patient was both verbally and visually identified by myself, the nursing staff, and anesthesia staff in the preoperative holding area. They were then transferred to the  operating room and placed on the operative table in supine position.  Attention was directed to the dorsal aspect of the left first metatarsophalangeal joint where a linear skin incision approximately 6cm long was made in the skin using a #15 blade. This incision was carried down through the subcutaneous tissue taking care to clamp and cauterize all neurovascular structures as necessary. The capsule of 1st MPJ was identified. A linear capsulotomy was then performed in-line with the original skin incision and the capsule was reflected both medially and laterally to expose the head of the first metatarsal and the base of the proximal phalanx. The articular surface of the 1st metatarsal head was noted to show evidence of [severe] degeneration under direct visualization. The sagittal saw was then used to resect the dorsal, medial, and lateral prominences off of the 1st metatarsal. A rongeur was utilized to remove the dorsal exostosis of the proximal phalanx. A curette was used to remove some of the cartilage, and the remaining cartilage was removed using a burr until punctate bleeding could be noted on the metatarsal head and base of proximal phalanx. The site as flushed with copious amounts of sterile saline. The distal aspect of the 1st metatarsal and the base of the proximal phalanx were then subchondrally drilled utilizing a pineapple burr. A Temporary K wire was then placed from distal-medial to proximal-lateral across the 1st MPJ, and the position was confirmed to be satisfactory utilizing fluoroscopy.. A dorsal left 1st MPJ fusion locking plate was then applied and secured with two olive wires. A smooth nephew stable was utilized through the dorsal plate to hold the fixation.The position was confirmed to be satisfactory with fluoroscopy, and two screws were placed both proximally and distally for a total of four Nonlocking/locking screws utilized. The 1st MPJ  was then stressed intraoperatively and no motion or  gapping was noted across the fusion site. Final imaging via fluoroscopy was taken to confirm adequate placement and rectus position of the hallux. The surgical site was copiously irrigated with sterile saline. The capsule was then reapproximated with 3-0 Monocortical in a simple interrupted fashion. The subcutaneous tissue was then reapproximated with 4-0 monocryl in a running fashion. The subcuticular was performed utilizing 5-0 Monocryl.   At the conclusion of the procedure the patient was awoken from anesthesia and found to have tolerated the procedure well any complications. There were transferred to PACU with vital signs stable and vascular status intact.  Nicholes Rough, DPM

## 2023-02-25 NOTE — Anesthesia Procedure Notes (Signed)
Anesthesia Regional Block: Adductor canal block   Pre-Anesthetic Checklist: , timeout performed,  Correct Patient, Correct Site, Correct Laterality,  Correct Procedure, Correct Position, site marked,  Risks and benefits discussed,  Surgical consent,  Pre-op evaluation,  At surgeon's request and post-op pain management  Laterality: Lower and Left  Prep: chloraprep       Needles:  Injection technique: Single-shot  Needle Type: Echogenic Needle     Needle Length: 9cm  Needle Gauge: 21     Additional Needles:   Procedures:,,,, ultrasound used (permanent image in chart),,    Narrative:  Injection made incrementally with aspirations every 5 mL.  Performed by: Personally  Anesthesiologist: Corinda Gubler, MD  Additional Notes: Patient's chart reviewed and they were deemed appropriate candidate for procedure, per surgeon's request. Patient educated about risks, benefits, and alternatives of the block including but not limited to: temporary or permanent nerve damage, bleeding, infection, damage to surround tissues, block failure, local anesthetic toxicity. Patient expressed understanding. A formal time-out was conducted consistent with institution rules.  Monitors were applied, and minimal sedation used (see nursing record). The site was prepped with skin prep and allowed to dry, and sterile gloves were used. A high frequency linear ultrasound probe with probe cover was utilized throughout. Femoral artery visualized at mid-thigh level, local anesthetic injected anterolateral to it, and echogenic block needle trajectory was monitored throughout. Hydrodissection of saphenous nerve visualized and appeared anatomically normal. Aspiration performed every 5ml. Blood vessels were avoided. All injections were performed without resistance and free of blood and paresthesias. The patient tolerated the procedure well.  Injectate: 10ml exparel (surgeon request) + 10ml 0.5% bupivacaine

## 2023-02-25 NOTE — Anesthesia Postprocedure Evaluation (Signed)
Anesthesia Post Note  Patient: Meghan [name]  Procedure(s) Performed: ARTHRODESIS METATARSALPHALANGEAL JOINT (MTPJ) (Left: Toe)  Patient location during evaluation: PACU Anesthesia Type: General Level of consciousness: awake and alert Pain management: pain level controlled Vital Signs Assessment: post-procedure vital signs reviewed and stable Respiratory status: spontaneous breathing, nonlabored ventilation, respiratory function stable and patient connected to nasal cannula oxygen Cardiovascular status: blood pressure returned to baseline and stable Postop Assessment: no apparent nausea or vomiting Anesthetic complications: no   There were no known notable events for this encounter.   Last Vitals:  Vitals:   02/25/23 1004 02/25/23 1016  BP:  (!) 106/47  Pulse: 66 (!) 58  Resp: 19 14  Temp: (!) 36.1 C (!) 36.2 C  SpO2: 93% (!) 61%    Last Pain:  Vitals:   02/25/23 1016  TempSrc: Temporal  PainSc: 2                  Corinda Gubler

## 2023-02-25 NOTE — Anesthesia Procedure Notes (Signed)
Procedure Name: Intubation Date/Time: 02/25/2023 7:50 AM  Performed by: Emeterio Reeve, CRNAPre-anesthesia Checklist: Patient identified, Emergency Drugs available, Suction available and Patient being monitored Patient Re-evaluated:Patient Re-evaluated prior to induction Oxygen Delivery Method: Circle system utilized Preoxygenation: Pre-oxygenation with 100% oxygen Induction Type: IV induction Ventilation: Mask ventilation without difficulty Laryngoscope Size: McGraph and 3 Grade View: Grade I Tube type: Oral Tube size: 7.0 mm Number of attempts: 1 Airway Equipment and Method: Stylet and Video-laryngoscopy Placement Confirmation: ETT inserted through vocal cords under direct vision, positive ETCO2 and breath sounds checked- equal and bilateral Tube secured with: Tape Dental Injury: Teeth and Oropharynx as per pre-operative assessment

## 2023-02-25 NOTE — OR Nursing (Signed)
One K-wire was hit by another K-wire while drilling. Part of the K-wire was left in the Patient's foot.

## 2023-02-26 ENCOUNTER — Encounter: Payer: Self-pay | Admitting: Podiatry

## 2023-02-27 ENCOUNTER — Ambulatory Visit (INDEPENDENT_AMBULATORY_CARE_PROVIDER_SITE_OTHER): Payer: 59 | Admitting: Licensed Clinical Social Worker

## 2023-02-27 DIAGNOSIS — F4310 Post-traumatic stress disorder, unspecified: Secondary | ICD-10-CM | POA: Diagnosis not present

## 2023-02-27 DIAGNOSIS — F331 Major depressive disorder, recurrent, moderate: Secondary | ICD-10-CM

## 2023-02-27 DIAGNOSIS — F411 Generalized anxiety disorder: Secondary | ICD-10-CM

## 2023-02-27 NOTE — Progress Notes (Signed)
Virtual Visit via Video Note   I connected with Meghan [name] on 02/27/23 at 2:00pm by video enabled telemedicine application and verified that I am speaking with the correct person using two identifiers.   I discussed the limitations, risks, security and privacy concerns of performing an evaluation and management service by video and the availability of in person appointments. I also discussed with the patient that there may be a patient responsible charge related to this service. The patient expressed understanding and agreed to proceed.   I discussed the assessment and treatment plan with the patient. The patient was provided an opportunity to ask questions and all were answered. The patient agreed with the plan and demonstrated an understanding of the instructions.   The patient was advised to call back or seek an in-person evaluation if the symptoms worsen or if the condition fails to improve as anticipated.   I provided 50 minutes of non-face-to-face time during this encounter.     Noralee Stain, LCSW, LCAS _______________________ THERAPIST PROGRESS NOTE   Session Time:  2:00pm - 2:50pm     Location: Patient: Patient Home Provider: [facility]   Participation Level: Active    Behavioral Response: Alert, casually dressed, irritable mood/affect   Type of Therapy:  Individual Therapy   Treatment Goals addressed: Mood management; Medication management: Managing boundaries in support network   Progress Towards Goals: Progressing    Interventions: CBT: establishing a self-care routine    Summary: Meghan [name] is a 57 year old divorced African American female that presented for therapy appointment today with diagnoses of Major Depressive Disorder, recurrent, moderate, Generalized Anxiety Disorder and PTSD.      Suicidal/Homicidal: None; without plan or intent                                                                                                                 Therapist Response:  Clinician met with Meghan [name] for virtual therapy appointment and assessed for safety, sobriety, and medication compliance.  Meghan [name] presented for session on time and was alert, oriented x5, with no evidence or self-report of active SI/HI or A/V H.  Meghan [name] reported ongoing compliance with medication and denied any use of alcohol or illicit substances.  Clinician inquired about Vicie's current emotional ratings, as well as any significant changes in thoughts, feelings, or behavior since previous check-in.  Meghan [name] reported scores of 0/10 for depression, 0/10 for anxiety, and 4/10 for anger/irritability.  Meghan [name] denied any recent panic attacks or outbursts.  Meghan [name] reported that a recent success was completing her surgery, stating "It went alright.  I was nervous.  I always get butterflies before procedures".  Meghan [name] reported that several members of her family and support network have visited and outreached her to check in.  Meghan [name] reported that she is also taking pain medicine as prescribed, although she did feel nauseas during this session due to not eating before taking it.  Meghan [name] reported that one present challenge is dealing with boredom while stuck inside her apartment  recovering, stating "I hate being stuck in here".  Clinician discussed topic of self-care with Meghan [name] today.  Clinician encouraged Meghan [name] to set aside time during the recovery week to do things she enjoys, and inquired about which specific indoor self-care activities would be appealing for her to include in days ahead, and not prove too physically taxing.  Clinician also provided helpful tips to aid in this process, including setting specific goals, making healthy activities a habit, setting appropriate boundaries to avoid falling out of routine again, and being mindful of maladaptive behaviors that could cause additional issues.  Intervention was effective, as evidenced by Meghan [name] actively engaging in discussion on topic, reporting she was  able to think of several activities to pass the time indoors, such as working on revising her book, sitting on the porch when the weather is nice, listening to relaxing or upbeat music, watching shows on TV that make her laugh, and more.  Meghan [name] reported that she will also be mindful of who she is talking to and spending time with, since some people in her network might be triggering, like a virtual group she had been attending, stating "They were stressing me out".  Meghan [name] stated "At least I'm moving around a lot more.  This should keep me from getting too bored".  Clinician will continue to monitor.            Plan: Follow up again in 1 week.    Diagnosis: Major depressive disorder, recurrent, moderate; Generalized Anxiety Disorder; and PTSD   Collaboration of Care:   No collaboration of care required for this visit.                                                   Patient/Guardian was advised Release of Information must be obtained prior to any record release in order to collaborate their care with an outside provider. Patient/Guardian was advised if they have not already done so to contact the registration department to sign all necessary forms in order for [name] to release information regarding their care.    Consent: Patient/Guardian gives verbal consent for treatment and assignment of benefits for services provided during this visit. Patient/Guardian expressed understanding and agreed to proceed.  Noralee Stain, LCSW, LCAS 02/27/23

## 2023-03-04 ENCOUNTER — Other Ambulatory Visit: Payer: 59

## 2023-03-06 ENCOUNTER — Ambulatory Visit (INDEPENDENT_AMBULATORY_CARE_PROVIDER_SITE_OTHER): Payer: 59 | Admitting: Podiatry

## 2023-03-06 ENCOUNTER — Ambulatory Visit (INDEPENDENT_AMBULATORY_CARE_PROVIDER_SITE_OTHER): Payer: 59

## 2023-03-06 DIAGNOSIS — Z9889 Other specified postprocedural states: Secondary | ICD-10-CM

## 2023-03-06 DIAGNOSIS — M19072 Primary osteoarthritis, left ankle and foot: Secondary | ICD-10-CM

## 2023-03-06 MED ORDER — OXYCODONE-ACETAMINOPHEN 5-325 MG PO TABS: 1.0000 | ORAL_TABLET | ORAL | 0 refills | Status: DC | PRN

## 2023-03-06 NOTE — Progress Notes (Signed)
Subjective:  Patient ID: Meghan [name], female    DOB: 1966-01-03,  MRN: [id number]  Chief Complaint  Patient presents with   Routine Post Op    POV#1 DOS 02/25/23 HALLUX MPJ FUSION LT    DOS: 02/25/2023 Procedure: Left total MPJ fusion  57 y.o. female returns for post-op check.  Patient states that she is doing well.  Bandages clean dry and intact.  She has been nonweightbearing to the left lower extremity  Review of Systems: Negative except as noted in the HPI. Denies N/V/F/Ch.  Past Medical History:  Diagnosis Date   (HFpEF) heart failure with preserved ejection fraction (HCC) 01/2019   a.) Dx'd in [place], [place]; b.) TTE 08/14/2022: EF 60-65%, mod LVH, mild LAE, triv MR   Anxiety    Asthma    Atrial fibrillation and flutter (HCC) 02/16/2019   a.) CHA2DS2VASc = 6 (sex, HFpEF, HTN, CVA x2, T2DM);  b.) s/p DCCV (200 J x 1) 08/22/2022; c.) s/p RF ablation (CTI) 11/02/2022; d.) rate/rhythm maintained on oral flecainide + metoprolol succinate; chronically anticoagulated with rivaroxaban   Class 3 severe obesity due to excess calories with body mass index (BMI) of 50.0 to 59.9 in adult [facility])    DDD (degenerative disc disease), lumbar    GERD (gastroesophageal reflux disease)    Hiatal hernia    History of head injury    HTN (hypertension)    Hypercholesterolemia    Ileus (HCC)    Intractable nausea and vomiting 11/16/2020   Long term current use of anticoagulant    a.) rivaroxaban   Macular degeneration of left eye    MDD (major depressive disorder)    OA (osteoarthritis)    OSA on CPAP    Pneumonia    Presence of IVC filter    PTSD (post-traumatic stress disorder)    Recurrent pulmonary emboli (HCC)    a.) s/p IVC filter placement 2003; b.) previously followed by hematology when living in [place], [place]   Seizure disorder [facility])    a.) last was in 2020 per pt report   Stroke (HCC)    1989 and 1995 (left sided weakness)   T2DM (type 2 diabetes mellitus) (HCC)    Vitamin D  deficiency     Current Outpatient Medications:    oxyCODONE-acetaminophen (PERCOCET) 5-325 MG tablet, Take 1 tablet by mouth every 4 (four) hours as needed for severe pain., Disp: 30 tablet, Rfl: 0   acetaminophen (TYLENOL) 500 MG tablet, Take 1,000 mg by mouth every 6 (six) hours as needed for moderate pain., Disp: , Rfl:    albuterol (VENTOLIN HFA) 108 (90 Base) MCG/ACT inhaler, Inhale 2 puffs into the lungs every 6 (six) hours as needed for wheezing or shortness of breath., Disp: , Rfl:    atorvastatin (LIPITOR) 40 MG tablet, Take 40 mg by mouth every evening., Disp: , Rfl:    bumetanide (BUMEX) 1 MG tablet, Take 1 tablet (1 mg total) by mouth daily for 10 days. (Patient taking differently: Take 1 mg by mouth every morning.), Disp: 10 tablet, Rfl: 0   Cholecalciferol (VITAMIN D-3) 125 MCG (5000 UT) TABS, Take 5,000 Units by mouth daily., Disp: , Rfl:    flecainide (TAMBOCOR) 50 MG tablet, Take 50 mg by mouth 2 (two) times daily. , Disp: , Rfl:    levETIRAcetam (KEPPRA) 500 MG tablet, Take 1 tablet (500 mg total) by mouth 2 (two) times daily., Disp: 60 tablet, Rfl: 0   Menthol-Camphor (TIGER BALM ARTHRITIS RUB) 11-11 % CREA,  Apply 1 application  topically daily as needed (Knee pain)., Disp: , Rfl:    metFORMIN (GLUCOPHAGE) 500 MG tablet, Take 500 mg by mouth daily with breakfast., Disp: , Rfl:    metoprolol succinate (TOPROL-XL) 25 MG 24 hr tablet, Take 1 tablet (25 mg total) by mouth daily. Take with or immediately following a meal. (Patient taking differently: Take 25 mg by mouth every morning. Take with or immediately following a meal.), Disp: 90 tablet, Rfl: 3   Omega-3 Fatty Acids (FISH OIL) 1200 MG CAPS, Take 1,200 mg by mouth daily with breakfast., Disp: , Rfl:    omeprazole (PRILOSEC) 40 MG capsule, Take 40 mg by mouth 2 (two) times daily., Disp: , Rfl:    oxyCODONE-acetaminophen (PERCOCET) 5-325 MG tablet, Take 1 tablet by mouth every 4 (four) hours as needed for severe pain., Disp: 30  tablet, Rfl: 0   rivaroxaban (XARELTO) 20 MG TABS tablet, Take 20 mg by mouth at bedtime. , Disp: , Rfl:    RYBELSUS 14 MG TABS, Take 14 mg by mouth in the morning., Disp: , Rfl:    sertraline (ZOLOFT) 50 MG tablet, Take 50 mg by mouth in the morning., Disp: , Rfl:   Social History   Tobacco Use  Smoking Status Never  Smokeless Tobacco Never    Allergies  Allergen Reactions   Acetaminophen-Codeine Swelling, Rash and Other (See Comments)    Tylenol with Codeine, Tylenol #3,  (facial swelling, hives)   Bee Venom Anaphylaxis   Meloxicam Anaphylaxis and Rash   Tramadol Hives and Other (See Comments)    Patient stated "I was trippin' and I do not want that ever again"   Codeine Swelling    hives   Coconut (Cocos Nucifera) Rash and Other (See Comments)    ANY coconut products    Tomato Rash    Fresh    Objective:  There were no vitals filed for this visit. There is no height or weight on file to calculate BMI. Constitutional Well developed. Well nourished.  Vascular Foot warm and well perfused. Capillary refill normal to all digits.   Neurologic Normal speech. Oriented to person, place, and time. Epicritic sensation to light touch grossly present bilaterally.  Dermatologic Skin healing well without signs of infection. Skin edges well coapted without signs of infection.  Orthopedic: Tenderness to palpation noted about the surgical site.   Radiographs: 3 views of skeletally mature left foot: Hardware is intact no signs of backing or loosening noted.  Reduction of deformity noted Assessment:   1. Post-operative state   2. Arthritis of first metatarsophalangeal (MTP) joint of left foot    Plan:  Patient was evaluated and treated and all questions answered.  S/p foot surgery left -Progressing as expected post-operatively. -XR: See above -WB Status: Nonweightbearing in left lower extremity -Sutures: Intact.  No clinical signs of Deis is noted no complication  noted -Medications: None -Foot redressed.  No follow-ups on file.

## 2023-03-07 ENCOUNTER — Ambulatory Visit (INDEPENDENT_AMBULATORY_CARE_PROVIDER_SITE_OTHER): Payer: 59 | Admitting: Licensed Clinical Social Worker

## 2023-03-07 DIAGNOSIS — F4310 Post-traumatic stress disorder, unspecified: Secondary | ICD-10-CM | POA: Diagnosis not present

## 2023-03-07 DIAGNOSIS — F331 Major depressive disorder, recurrent, moderate: Secondary | ICD-10-CM | POA: Diagnosis not present

## 2023-03-07 DIAGNOSIS — F411 Generalized anxiety disorder: Secondary | ICD-10-CM

## 2023-03-07 NOTE — Progress Notes (Signed)
Virtual Visit via Video Note   I connected with Meghan [name] on 03/07/23 at 3:00pm by video enabled telemedicine application and verified that I am speaking with the correct person using two identifiers.   I discussed the limitations, risks, security and privacy concerns of performing an evaluation and management service by video and the availability of in person appointments. I also discussed with the patient that there may be a patient responsible charge related to this service. The patient expressed understanding and agreed to proceed.   I discussed the assessment and treatment plan with the patient. The patient was provided an opportunity to ask questions and all were answered. The patient agreed with the plan and demonstrated an understanding of the instructions.   The patient was advised to call back or seek an in-person evaluation if the symptoms worsen or if the condition fails to improve as anticipated.   I provided 45 minutes of non-face-to-face time during this encounter.     Noralee Stain, LCSW, LCAS _______________________ THERAPIST PROGRESS NOTE   Session Time:  3:00pm - 3:45pm  Location: Patient: Patient Home Provider: [facility]   Participation Level: Active    Behavioral Response: Alert, casually dressed, euthymic mood/affect   Type of Therapy:  Individual Therapy   Treatment Goals addressed: Mood management; Medication management: Managing boundaries in support network   Progress Towards Goals: Progressing    Interventions: CBT, communication skills    Summary: Meghan [name] is a 57 year old divorced African American female that presented for therapy appointment today with diagnoses of Major Depressive Disorder, recurrent, moderate, Generalized Anxiety Disorder and PTSD.      Suicidal/Homicidal: None; without plan or intent                                                                                                                Therapist Response:   Meghan [name] met with Meghan [name] for virtual therapy session and assessed for safety, sobriety, and medication compliance.  Meghan [name] presented for appointment on time and was alert, oriented x5, with no evidence or self-report of active SI/HI or A/V H.  Verla reported ongoing compliance with medication and denied any use of alcohol or illicit substances.  Meghan [name] inquired about Meghan [name]'s emotional ratings today, as well as any significant changes in thoughts, feelings, or behavior since last check-in.  Armeda reported scores of 0/10 for depression, 0/10 for anxiety, and 0/10 for anger/irritability.  Meghan [name] denied any recent panic attacks.  Meghan [name] reported that a recent success was having a outburst of joy this morning when she learned that her son is going to be a father, stating "I'll be a grandmother again".  She reported that one struggle was dealing with a neighbor upstairs that has been very noisy, which is disruptive to her sleep at night, stating "She will be banging and knocking around stuff late at night".  Meghan [name] reported that she was hesitant to address this problem directly because the last time she brought it up, the woman cursed at her and  the landlord had to get involved.  Meghan [name] reviewed material with Meghan [name] on communication skills which could be utilized to increase understanding and support with her neighbor.  Meghan [name] utilized a handout on 'soft startups' which offered suggestions on how Meghan [name] could address a problem assertively with neighbors, including tips such as choosing an appropriate time/setting, being mindful of maintaining gentle tone, volume and language, while avoiding triggering nonverbals such as rolling eyes, as well as utilizing "I" statements to express feelings, focusing on one problem at a time, and being respectful.  Meghan [name] also reminded Karaline that use of relaxation/coping skills taught from previous therapy sessions could help address difficult feelings such as anger which could arise  when feeling triggered by the neighbors rudeness.  Intervention was effective, as evidenced by Joua's active engagement in discussion on subject, and receptiveness to suggestions offered for improving communication and anger management.  Tehani reported that she would choose the right moment to speak with the neighbor assertively about ongoing issues, and if they can't resolve this amicably, or she feels like having an outburst, she will go to the landlord for intervention again.  Meghan [name] will continue to monitor.            Plan: Follow up again in 1 week.    Diagnosis: Major depressive disorder, recurrent, moderate; Generalized Anxiety Disorder; and PTSD   Collaboration of Care:   No collaboration of care required for this visit.                                                   Patient/Guardian was advised Release of Information must be obtained prior to any record release in order to collaborate their care with an outside provider. Patient/Guardian was advised if they have not already done so to contact the registration department to sign all necessary forms in order for [name] to release information regarding their care.    Consent: Patient/Guardian gives verbal consent for treatment and assignment of benefits for services provided during this visit. Patient/Guardian expressed understanding and agreed to proceed.  Noralee Stain, LCSW, LCAS 03/07/23

## 2023-03-13 ENCOUNTER — Ambulatory Visit (INDEPENDENT_AMBULATORY_CARE_PROVIDER_SITE_OTHER): Payer: 59 | Admitting: Licensed Clinical Social Worker

## 2023-03-13 DIAGNOSIS — F4310 Post-traumatic stress disorder, unspecified: Secondary | ICD-10-CM | POA: Diagnosis not present

## 2023-03-13 DIAGNOSIS — F331 Major depressive disorder, recurrent, moderate: Secondary | ICD-10-CM | POA: Diagnosis not present

## 2023-03-13 DIAGNOSIS — F411 Generalized anxiety disorder: Secondary | ICD-10-CM | POA: Diagnosis not present

## 2023-03-13 NOTE — Progress Notes (Signed)
Virtual Visit via Video Note   I connected with Meghan [name] on 03/13/23 at 3:09pm by video enabled telemedicine application and verified that I am speaking with the correct person using two identifiers.   I discussed the limitations, risks, security and privacy concerns of performing an evaluation and management service by video and the availability of in person appointments. I also discussed with the patient that there may be a patient responsible charge related to this service. The patient expressed understanding and agreed to proceed.   I discussed the assessment and treatment plan with the patient. The patient was provided an opportunity to ask questions and all were answered. The patient agreed with the plan and demonstrated an understanding of the instructions.   The patient was advised to call back or seek an in-person evaluation if the symptoms worsen or if the condition fails to improve as anticipated.   I provided 40 minutes of non-face-to-face time during this encounter.     Noralee Stain, LCSW, LCAS _______________________ THERAPIST PROGRESS NOTE   Session Time:  3:09pm - 3:49pm   Location: Patient: Patient Home Provider: [facility]   Participation Level: Active    Behavioral Response: Alert, casually dressed, angry/irritable mood/affect   Type of Therapy:  Individual Therapy   Treatment Goals addressed: Mood management; Medication management: Managing boundaries in support network   Progress Towards Goals: Progressing   Interventions: CBT, communication skills, problem solving    Summary: Meghan [name] is a 57 year old divorced African American female that presented for therapy appointment today with diagnoses of Major Depressive Disorder, recurrent, moderate, Generalized Anxiety Disorder and PTSD.      Suicidal/Homicidal: None; without plan or intent                                                                                                                 Therapist Response:  Clinician met with Meghan [name] for virtual therapy appointment and assessed for safety, sobriety, and medication compliance.  Meghan [name] presented for session 9 minutes late, reporting that she had her days mixed up and forgot about our appointment until she received an invite reminder.  She was alert, oriented x5, with no evidence or self-report of active SI/HI or A/V H.  Meghan [name] reported ongoing compliance with medication and denied any use of alcohol or illicit substances.  Clinician inquired about Meghan [name]'s current emotional ratings, as well as any significant changes in thoughts, feelings, or behavior since previous check-in.  Meghan [name] reported scores of 0/10 for depression, 0/10 for anxiety, and 10/10 for anger/irritability.  Meghan [name] denied any recent panic attacks or outbursts.  Meghan [name] reported that she is still dealing with a noisy neighbor upstairs that is making it difficult to sleep at night, but she plans to continue documenting this, and call the police tonight to file a noise complaint if it continues after 10pm.  Clinician was supportive of this course of action, and reminded her of importance in using assertive communication learned from therapy to speak up for herself clearly,  but respectfully when issues arise.  Meghan [name] reported that an additional struggle is getting frequent calls from her son's girlfriend, who has been having mental health issues during pregnancy.  Meghan [name] reported that at times she isn't sure how to be supportive during these calls.  Clinician utilized a handout on the subject with Meghan [name] to assist.  This handout offered strategies for helping a family member also struggling with mental health issues while maintaining healthy boundaries to protect oneself.  Some of the suggestions offered included referring the individual for therapy, linking them with a psychiatrist to discuss medication appropriateness, being willing to listen to their concerns without judgement, giving  reinforcement for positive changes made, offering additional assistance when possible, creating a low stress environment at home, and making plans together to look forward to. Clinician also pointed out how Meghan [name]'s own experience in learning to cope with her depression and anxiety could be perceived as a strength and offers her a chance to bond with daughter in law during this difficult time.  Intervention was effective, as evidenced by Meghan [name] actively engaging in discussion on subject, and expressing receptiveness to suggestions offered, reporting that she would like to help more, but has to be mindful of boundaries since she is still recovering from surgery, and could find the situation triggering if she is not careful.  Meghan [name] reported that she would recommend this family member get a therapist and consider speaking with a psychiatrist for professional intervention.  Clinician will continue to monitor.            Plan: Follow up again in 1 week.    Diagnosis: Major depressive disorder, recurrent, moderate; Generalized Anxiety Disorder; and PTSD   Collaboration of Care:   No collaboration of care required for this visit.                                                   Patient/Guardian was advised Release of Information must be obtained prior to any record release in order to collaborate their care with an outside provider. Patient/Guardian was advised if they have not already done so to contact the registration department to sign all necessary forms in order for [name] to release information regarding their care.    Consent: Patient/Guardian gives verbal consent for treatment and assignment of benefits for services provided during this visit. Patient/Guardian expressed understanding and agreed to proceed.  Noralee Stain, LCSW, LCAS 03/13/23

## 2023-03-20 ENCOUNTER — Encounter: Payer: 59 | Admitting: Podiatry

## 2023-03-20 ENCOUNTER — Ambulatory Visit (INDEPENDENT_AMBULATORY_CARE_PROVIDER_SITE_OTHER): Payer: 59 | Admitting: Podiatry

## 2023-03-20 DIAGNOSIS — M19072 Primary osteoarthritis, left ankle and foot: Secondary | ICD-10-CM

## 2023-03-20 DIAGNOSIS — Z9889 Other specified postprocedural states: Secondary | ICD-10-CM

## 2023-03-20 NOTE — Progress Notes (Signed)
Subjective:  Patient ID: Meghan [name], female    DOB: 01-Apr-1966,  MRN: [id number]  Chief Complaint  Patient presents with   Routine Post Op    DOS: 02/25/2023 Procedure: Left total MPJ fusion  57 y.o. female returns for post-op check.  Patient states that she is doing well.  Bandages clean dry and intact.  She denies any other acute complaints healing well  Review of Systems: Negative except as noted in the HPI. Denies N/V/F/Ch.  Past Medical History:  Diagnosis Date   (HFpEF) heart failure with preserved ejection fraction (HCC) 01/2019   a.) Dx'd in [place], [place]; b.) TTE 08/14/2022: EF 60-65%, mod LVH, mild LAE, triv MR   Anxiety    Asthma    Atrial fibrillation and flutter (HCC) 02/16/2019   a.) CHA2DS2VASc = 6 (sex, HFpEF, HTN, CVA x2, T2DM);  b.) s/p DCCV (200 J x 1) 08/22/2022; c.) s/p RF ablation (CTI) 11/02/2022; d.) rate/rhythm maintained on oral flecainide + metoprolol succinate; chronically anticoagulated with rivaroxaban   Class 3 severe obesity due to excess calories with body mass index (BMI) of 50.0 to 59.9 in adult [facility])    DDD (degenerative disc disease), lumbar    GERD (gastroesophageal reflux disease)    Hiatal hernia    History of head injury    HTN (hypertension)    Hypercholesterolemia    Ileus (HCC)    Intractable nausea and vomiting 11/16/2020   Long term current use of anticoagulant    a.) rivaroxaban   Macular degeneration of left eye    MDD (major depressive disorder)    OA (osteoarthritis)    OSA on CPAP    Pneumonia    Presence of IVC filter    PTSD (post-traumatic stress disorder)    Recurrent pulmonary emboli (HCC)    a.) s/p IVC filter placement 2003; b.) previously followed by hematology when living in [place], [place]   Seizure disorder [facility])    a.) last was in 2020 per pt report   Stroke (HCC)    1989 and 1995 (left sided weakness)   T2DM (type 2 diabetes mellitus) (HCC)    Vitamin D deficiency     Current Outpatient Medications:     acetaminophen (TYLENOL) 500 MG tablet, Take 1,000 mg by mouth every 6 (six) hours as needed for moderate pain., Disp: , Rfl:    albuterol (VENTOLIN HFA) 108 (90 Base) MCG/ACT inhaler, Inhale 2 puffs into the lungs every 6 (six) hours as needed for wheezing or shortness of breath., Disp: , Rfl:    atorvastatin (LIPITOR) 40 MG tablet, Take 40 mg by mouth every evening., Disp: , Rfl:    bumetanide (BUMEX) 1 MG tablet, Take 1 tablet (1 mg total) by mouth daily for 10 days. (Patient taking differently: Take 1 mg by mouth every morning.), Disp: 10 tablet, Rfl: 0   Cholecalciferol (VITAMIN D-3) 125 MCG (5000 UT) TABS, Take 5,000 Units by mouth daily., Disp: , Rfl:    flecainide (TAMBOCOR) 50 MG tablet, Take 50 mg by mouth 2 (two) times daily. , Disp: , Rfl:    levETIRAcetam (KEPPRA) 500 MG tablet, Take 1 tablet (500 mg total) by mouth 2 (two) times daily., Disp: 60 tablet, Rfl: 0   Menthol-Camphor (TIGER BALM ARTHRITIS RUB) 11-11 % CREA, Apply 1 application  topically daily as needed (Knee pain)., Disp: , Rfl:    metFORMIN (GLUCOPHAGE) 500 MG tablet, Take 500 mg by mouth daily with breakfast., Disp: , Rfl:    metoprolol succinate (  TOPROL-XL) 25 MG 24 hr tablet, Take 1 tablet (25 mg total) by mouth daily. Take with or immediately following a meal. (Patient taking differently: Take 25 mg by mouth every morning. Take with or immediately following a meal.), Disp: 90 tablet, Rfl: 3   Omega-3 Fatty Acids (FISH OIL) 1200 MG CAPS, Take 1,200 mg by mouth daily with breakfast., Disp: , Rfl:    omeprazole (PRILOSEC) 40 MG capsule, Take 40 mg by mouth 2 (two) times daily., Disp: , Rfl:    oxyCODONE-acetaminophen (PERCOCET) 5-325 MG tablet, Take 1 tablet by mouth every 4 (four) hours as needed for severe pain., Disp: 30 tablet, Rfl: 0   oxyCODONE-acetaminophen (PERCOCET) 5-325 MG tablet, Take 1 tablet by mouth every 4 (four) hours as needed for severe pain., Disp: 30 tablet, Rfl: 0   rivaroxaban (XARELTO) 20 MG TABS  tablet, Take 20 mg by mouth at bedtime. , Disp: , Rfl:    RYBELSUS 14 MG TABS, Take 14 mg by mouth in the morning., Disp: , Rfl:    sertraline (ZOLOFT) 50 MG tablet, Take 50 mg by mouth in the morning., Disp: , Rfl:   Social History   Tobacco Use  Smoking Status Never  Smokeless Tobacco Never    Allergies  Allergen Reactions   Acetaminophen-Codeine Swelling, Rash and Other (See Comments)    Tylenol with Codeine, Tylenol #3,  (facial swelling, hives)   Bee Venom Anaphylaxis   Meloxicam Anaphylaxis and Rash   Tramadol Hives and Other (See Comments)    Patient stated "I was trippin' and I do not want that ever again"   Codeine Swelling    hives   Coconut (Cocos Nucifera) Rash and Other (See Comments)    ANY coconut products    Tomato Rash    Fresh    Objective:  There were no vitals filed for this visit. There is no height or weight on file to calculate BMI. Constitutional Well developed. Well nourished.  Vascular Foot warm and well perfused. Capillary refill normal to all digits.   Neurologic Normal speech. Oriented to person, place, and time. Epicritic sensation to light touch grossly present bilaterally.  Dermatologic Skin completely epithelialized.  No signs of Deis is noted no complication noted.  Good correction alignment noted of the toe.  Rigid first MPJ joint noted  Orthopedic: Tenderness to palpation noted about the surgical site.   Radiographs: 3 views of skeletally mature left foot: Hardware is intact no signs of backing or loosening noted.  Reduction of deformity noted Assessment:   No diagnosis found.  Plan:  Patient was evaluated and treated and all questions answered.  S/p foot surgery left -Progressing as expected post-operatively. -XR: See above -WB Status: Weightbearing as tolerated in cam boot -Sutures: Removed no clinical signs of Deis is noted no complication noted -Medications: None -Foot redressed.  No follow-ups on file.

## 2023-04-03 ENCOUNTER — Encounter: Payer: 59 | Admitting: Podiatry

## 2023-04-16 ENCOUNTER — Ambulatory Visit (INDEPENDENT_AMBULATORY_CARE_PROVIDER_SITE_OTHER): Payer: 59 | Admitting: Licensed Clinical Social Worker

## 2023-04-16 DIAGNOSIS — F411 Generalized anxiety disorder: Secondary | ICD-10-CM

## 2023-04-16 DIAGNOSIS — F4310 Post-traumatic stress disorder, unspecified: Secondary | ICD-10-CM | POA: Diagnosis not present

## 2023-04-16 DIAGNOSIS — F331 Major depressive disorder, recurrent, moderate: Secondary | ICD-10-CM

## 2023-04-16 NOTE — Progress Notes (Signed)
Virtual Visit via Video Note   I connected with Meghan [name] on 04/16/23 at 1:00pm by video enabled telemedicine application and verified that I am speaking with the correct person using two identifiers.   I discussed the limitations, risks, security and privacy concerns of performing an evaluation and management service by video and the availability of in person appointments. I also discussed with the patient that there may be a patient responsible charge related to this service. The patient expressed understanding and agreed to proceed.   I discussed the assessment and treatment plan with the patient. The patient was provided an opportunity to ask questions and all were answered. The patient agreed with the plan and demonstrated an understanding of the instructions.   The patient was advised to call back or seek an in-person evaluation if the symptoms worsen or if the condition fails to improve as anticipated.   I provided 48 minutes of non-face-to-face time during this encounter.     Meghan Stain, LCSW, LCAS _______________________ THERAPIST PROGRESS NOTE   Session Time:  1:00pm - 1:48pm    Location: Patient: Patient Home Provider: [facility]   Participation Level: Active    Behavioral Response: Alert, casually dressed, angry/irritable mood/affect   Type of Therapy:  Individual Therapy   Treatment Goals addressed: Mood management; Medication management  Progress Towards Goals: Progressing   Interventions: CBT: gratitude practice/journaling    Summary: Meghan [name] is a 57 year old divorced African American female that presented for therapy appointment today with diagnoses of Major Depressive Disorder, recurrent, moderate, Generalized Anxiety Disorder and PTSD.      Suicidal/Homicidal: None; without plan or intent                                                                                                                Therapist Response:  Clinician met with Meghan [name] for  virtual therapy session and assessed for safety, sobriety, and medication compliance.  Meghan [name] presented for appointment on time and was alert, oriented x5, with no evidence or self-report of active SI/HI or A/V H.  Meghan [name] reported ongoing compliance with medication and denied any use of alcohol or illicit substances.  Clinician inquired about Meghan [name]'s emotional ratings today, as well as any significant changes in thoughts, feelings, or behavior since last check-in.  Meghan [name] reported scores of 0/10 for depression, 10/10 for anxiety, and 10/10 for anger/irritability.  Meghan [name] denied any recent panic attacks or outbursts.  Meghan [name] reported that the past few weeks have been difficult, as 3 friends and family members have passed away, she has had issues with her daughter, and she is continuing to heal from surgery.  She reported that her increase in anxiety and anger have been directly linked to these stressors, stating "I'm trying to keep busy, but its been a lot to deal with all at once". Clinician discussed topic of gratitude journaling with Meghan [name] as a form of self-care.  Clinician virtually shared a handout with her today which explained the benefits of this practice, including  reduction in stress, increased happiness, and self-esteem.  Tips were also provided to aid in practice, such as taking time with entries, writing about people one is grateful for, and setting goal for two entries per week for at least 10-20 minutes at a time.  Clinician also provided Meghan [name] with a variety of journaling prompts to choose from today, and encouraged her to take time to write about something she is grateful for, with examples such as "Something beautiful I recently saw was..", "Something I can be proud of is.", "A reason to be excited for the future is." and more.  Meghan [name] was encouraged to share her entries aloud, along with her perspective on the activity and motivation level towards making this a habit. Intervention was effective, as  evidenced by Meghan [name] participating in journaling activity, and choosing the prompts "What are 3 simple things I'm grateful for" and "Who has supported me during difficult times".  Meghan [name] expressed gratitude for having basic needs met, including shelter, power, water, and capable maintenance staff at her apartment to fix the [facility] when it went out the other day.  She reported that she was also grateful for positive memories shared with the friends and family that have passed in recent weeks, and having her higher power to lean on when faced with uncertainty.  Meghan [name] stated "I'm grateful for every day that he gives me".  Meghan [name] reported that she has a daily journal, and would prioritize gratitude practice to cope with recent challenges.  Clinician will continue to monitor.            Plan: Follow up again in 1 week.    Diagnosis: Major depressive disorder, recurrent, moderate; Generalized Anxiety Disorder; and PTSD   Collaboration of Care:   No collaboration of care required for this visit.                                                   Patient/Guardian was advised Release of Information must be obtained prior to any record release in order to collaborate their care with an outside provider. Patient/Guardian was advised if they have not already done so to contact the registration department to sign all necessary forms in order for [name] to release information regarding their care.    Consent: Patient/Guardian gives verbal consent for treatment and assignment of benefits for services provided during this visit. Patient/Guardian expressed understanding and agreed to proceed.  Meghan Stain, LCSW, LCAS 04/16/23

## 2023-04-23 ENCOUNTER — Ambulatory Visit (INDEPENDENT_AMBULATORY_CARE_PROVIDER_SITE_OTHER): Payer: 59 | Admitting: Licensed Clinical Social Worker

## 2023-04-23 DIAGNOSIS — F411 Generalized anxiety disorder: Secondary | ICD-10-CM

## 2023-04-23 DIAGNOSIS — F4310 Post-traumatic stress disorder, unspecified: Secondary | ICD-10-CM

## 2023-04-23 DIAGNOSIS — F331 Major depressive disorder, recurrent, moderate: Secondary | ICD-10-CM

## 2023-04-23 NOTE — Progress Notes (Signed)
Virtual Visit via Video Note   I connected with Meghan [name] on 04/23/23 at 3:00pm by video enabled telemedicine application and verified that I am speaking with the correct person using two identifiers.   I discussed the limitations, risks, security and privacy concerns of performing an evaluation and management service by video and the availability of in person appointments. I also discussed with the patient that there may be a patient responsible charge related to this service. The patient expressed understanding and agreed to proceed.   I discussed the assessment and treatment plan with the patient. The patient was provided an opportunity to ask questions and all were answered. The patient agreed with the plan and demonstrated an understanding of the instructions.   The patient was advised to call back or seek an in-person evaluation if the symptoms worsen or if the condition fails to improve as anticipated.   I provided 1 hour of non-face-to-face time during this encounter.     Noralee Stain, LCSW, LCAS _______________________ THERAPIST PROGRESS NOTE   Session Time:  3:00pm - 4:00pm   Location: Patient: Patient Home Provider: [facility]   Participation Level: Active    Behavioral Response: Alert, casually dressed, euthymic mood/affect   Type of Therapy:  Individual Therapy   Treatment Goals addressed: Mood management; Medication management; Managing boundaries with support network  Progress Towards Goals: Progressing   Interventions: CBT, communication skills, psychoeducation on boundaries    Summary: Meghan [name] is a 57 year old divorced African American female that presented for therapy appointment today with diagnoses of Major Depressive Disorder, recurrent, moderate, Generalized Anxiety Disorder and PTSD.      Suicidal/Homicidal: None; without plan or intent                                                                                                                 Therapist Response:  Clinician met with Aishi for virtual therapy appointment and assessed for safety, sobriety, and medication compliance.  Dawnna presented for appointment on time and was alert, oriented x5, with no evidence or self-report of active SI/HI or A/V H.  Maral reported ongoing compliance with medication and denied any use of alcohol or illicit substances.  Clinician inquired about Aaren's current emotional ratings, as well as any significant changes in thoughts, feelings, or behavior since previous check-in.  Anysia reported scores of 0/10 for depression, 0/10 for anxiety, and 0/10 for anger/irritability.  Xana denied any recent panic attacks.  Felisia reported that a recent struggle was having an outburst toward her daughter.  Clinician inquired about what triggered this outburst, and whether she utilized any coping skills to avoid this.  Norah reported that her grandson had been staying with her for 3 weeks, and she agreed to pay half the cost of his hair appointment to help her daughter.  Addisynn reported that her daughter waited several days to send her money, and did not send the full amount, which put her apartment at risk.  Emilee reported that when she  confronted her daughter about the missing money when rent was due, she became very upset, walked out the door without saying anything, and had her family worried.  Clinician reviewed topic of communication traps with Lissett to assist.  Clinician warned Lorenza against engaging in common traps like this such as stonewalling, which can hurt the relationship by making the other person involved feel angry, confused, or hurt, and lead problems to go unresolved.  Clinician offered alternative strategies for resolving conflict like this in the future, such as asking for a timeout to calm down via practice of coping skills like deep breathing, meditation, or speaking with a supportive person not directly involved in the conflict.  Silvanna reported that it would  have been better to request a timeout rather than walking out, since this also upset family, and she ended up walking so far that it hurt her foot.  Avrianna reported that she did end up speaking with positive supports like her pastor afterward, who were helpful in calming her down, and they assisted her financially to avoid eviction.  Clinician also discussed topic of healthy boundaries with Tanija, and assertive strategies for setting them, noting that establishing firmer material boundaries within her network could help prevent future financial issues that might put basic needs at risk.  Interventions were effective, as evidenced by Deshanae expressing receptiveness to material, reporting that she would no longer loan out money to her daughter unless it relates to 'needs' or safety/security, rather than 'wants' that don't count as emergencies.  She reported that she would also consider talking to her daughter about budgeting tips that could make her more independent and reduce possibility of boundary being tested again in the future.  Clinician will continue to monitor.          Plan: Follow up again in 1 week.    Diagnosis: Major depressive disorder, recurrent, moderate; Generalized Anxiety Disorder; and PTSD   Collaboration of Care:   No collaboration of care required for this visit.                                                   Patient/Guardian was advised Release of Information must be obtained prior to any record release in order to collaborate their care with an outside provider. Patient/Guardian was advised if they have not already done so to contact the registration department to sign all necessary forms in order for [name] to release information regarding their care.    Consent: Patient/Guardian gives verbal consent for treatment and assignment of benefits for services provided during this visit. Patient/Guardian expressed understanding and agreed to proceed.  Noralee Stain, LCSW, LCAS 04/23/23

## 2023-04-24 ENCOUNTER — Ambulatory Visit: Payer: 59 | Attending: Internal Medicine | Admitting: Internal Medicine

## 2023-04-24 ENCOUNTER — Encounter: Payer: Self-pay | Admitting: Internal Medicine

## 2023-04-24 VITALS — BP 110/72 | HR 74 | Ht 65.0 in | Wt 307.0 lb

## 2023-04-24 DIAGNOSIS — I4892 Unspecified atrial flutter: Secondary | ICD-10-CM | POA: Diagnosis not present

## 2023-04-24 DIAGNOSIS — R609 Edema, unspecified: Secondary | ICD-10-CM

## 2023-04-24 DIAGNOSIS — I4891 Unspecified atrial fibrillation: Secondary | ICD-10-CM

## 2023-04-24 MED ORDER — EMPAGLIFLOZIN 10 MG PO TABS: 10.0000 mg | ORAL_TABLET | Freq: Every day | ORAL | 3 refills | Status: DC

## 2023-04-24 MED ORDER — FLECAINIDE ACETATE 50 MG PO TABS: 50.0000 mg | ORAL_TABLET | Freq: Two times a day (BID) | ORAL | 3 refills | Status: DC

## 2023-04-24 MED ORDER — METOPROLOL SUCCINATE ER 25 MG PO TB24: 25.0000 mg | ORAL_TABLET | Freq: Every day | ORAL | 3 refills | Status: DC

## 2023-04-24 NOTE — Patient Instructions (Signed)
Medication Instructions:  START jardiance 10 Mg daily   *If you need a refill on your cardiac medications before your next appointment, please call your pharmacy*   Lab Work: None needed   If you have labs (blood work) drawn today and your tests are completely normal, you will receive your results only by: MyChart Message (if you have MyChart) OR A paper copy in the mail If you have any lab test that is abnormal or we need to change your treatment, we will call you to review the results.   Testing/Procedures: None needed    Follow-Up: At [facility], you and your health needs are our priority.  As part of our continuing mission to provide you with exceptional heart care, we have created designated Provider Care Teams.  These Care Teams include your primary Cardiologist (physician) and Advanced Practice Providers (APPs -  Physician Assistants and Nurse Practitioners) who all work together to provide you with the care you need, when you need it.   Your next appointment:   12 month(s)  Provider:   Maisie Fus, MD

## 2023-04-24 NOTE — Progress Notes (Signed)
Cardiology Office Note:    Date:  04/24/2023   ID:  Meghan [name], DOB Jun 07, 1966, MRN [id number]  PCP:  Felix Pacini, FNP   [facility] Providers Cardiologist:  Maisie Fus, MD Electrophysiologist:  Maurice Small, MD     Referring MD: Felix Pacini, *   No chief complaint on file. Atrial Flutter  History of Present Illness:    Meghan [name] is a 57 y.o. female with a hx of T2DM, OSA on CPAP, hx of PE in 1993 (was on warfarin prior, no xarelto) s/p IVC filter (has had multiple PEs despite AC therefore had filter placed per patient), ? HFpEF (EF unknown), stroke, HLD, atrial fibrillation on xarelto, ECG resolution is not optimal. Was followed by Dr. Boneta Lucks at [place] in 2020. She was managed on flecainide and ? cardizem.  Had an echo 2020, can't see the results. She was referred from her PCP at [facility] for afib.  Found to be 2:1 atrial flutter.  She has hx of heart failure per patient in [place]. , she was on lasix. She had 1 hospitalization for this at [facility]. She had IV lasix. No hx of CAD. She notes palpitations. She feels more fatigued. She sleeps on 3 pillows. No PND. No LE edema. Good blood pressure. She sees Dr. Mercy Riding at [facility].  Interim hx 10/24/2022: S/p DCCV and converted to NSR. She is preparing for flutter ablation. She is taking her xarelto. She is having L shoulder MSK pain. Gained some weight up to 307 after being in the 200s.   Interim hx 04/24/2023 She recently was with her grandson.  She reports feeling good and has not experienced any heart racing since their flutter ablation. She has not had any hospitalizations beyond the ablation. The patient has been having difficulty obtaining refills for their flecainide and metoprolol from their primary cardiologist, Dr. Mercy Riding. They are scheduled to see Dr. Mercy Riding on Friday.  She denies orthopnea/PND. No LE edema.   EKG in March in sinus rhythm 1st degree block.  She has recently  undergone surgery on both feet to correct toe deformities and bunions. They are currently wearing a boot on one foot. She is also scheduled for oral surgery to remove a wisdom tooth and requires clearance from a cardiologist to stop taking Xarelto before the procedure.  The patient's last A1C was 5.1, which has been consistent for the past few measurements. They are interested in trying Jardiance to help HF hospitalizations.   She is also working on losing weight and maintaining a healthy lifestyle for the sake of her five grandchildren.  Past Medical History:  Diagnosis Date   (HFpEF) heart failure with preserved ejection fraction (HCC) 01/2019   a.) Dx'd in [place], [place]; b.) TTE 08/14/2022: EF 60-65%, mod LVH, mild LAE, triv MR   Anxiety    Asthma    Atrial fibrillation and flutter (HCC) 02/16/2019   a.) CHA2DS2VASc = 6 (sex, HFpEF, HTN, CVA x2, T2DM);  b.) s/p DCCV (200 J x 1) 08/22/2022; c.) s/p RF ablation (CTI) 11/02/2022; d.) rate/rhythm maintained on oral flecainide + metoprolol succinate; chronically anticoagulated with rivaroxaban   Class 3 severe obesity due to excess calories with body mass index (BMI) of 50.0 to 59.9 in adult [facility])    DDD (degenerative disc disease), lumbar    GERD (gastroesophageal reflux disease)    Hiatal hernia    History of head injury    HTN (hypertension)    Hypercholesterolemia  Ileus (HCC)    Intractable nausea and vomiting 11/16/2020   Long term current use of anticoagulant    a.) rivaroxaban   Macular degeneration of left eye    MDD (major depressive disorder)    OA (osteoarthritis)    OSA on CPAP    Pneumonia    Presence of IVC filter    PTSD (post-traumatic stress disorder)    Recurrent pulmonary emboli (HCC)    a.) s/p IVC filter placement 2003; b.) previously followed by hematology when living in [place], [place]   Seizure disorder [facility])    a.) last was in 2020 per pt report   Stroke (HCC)    1989 and 1995 (left sided weakness)    T2DM (type 2 diabetes mellitus) (HCC)    Vitamin D deficiency     Past Surgical History:  Procedure Laterality Date   A-FLUTTER ABLATION N/A 11/02/2022   Procedure: A-FLUTTER ABLATION;  Surgeon: Mealor, Roberts Gaudy, MD;  [facility]: [facility];  Service: Cardiovascular;  Laterality: N/A;   ARTHRODESIS METATARSALPHALANGEAL JOINT (MTPJ) Left 02/25/2023   Procedure: ARTHRODESIS METATARSALPHALANGEAL JOINT (MTPJ);  Surgeon: Candelaria Stagers, DPM;  Location: [facility] ORS;  Service: Podiatry;  Laterality: Left;   CARDIAC CATHETERIZATION  2017   in [facility] in [place]   CARDIOVERSION N/A 08/22/2022   Procedure: CARDIOVERSION;  Surgeon: Thomasene Ripple, DO;  Location: [facility] ENDOSCOPY;  Service: Cardiovascular;  Laterality: N/A;   CESAREAN SECTION N/A 1989   CESAREAN SECTION N/A 1993   CHOLECYSTECTOMY  2003   COLONOSCOPY WITH PROPOFOL N/A 06/05/2019   Procedure: COLONOSCOPY WITH PROPOFOL;  Surgeon: Jeani Hawking, MD;  Location: [facility] ENDOSCOPY;  Service: Endoscopy;  Laterality: N/A;   CYST REMOVAL WITH BONE GRAFT Left 10/18/2020   Procedure: BONE GRAFTING OF ENCHONDROMA MIDDLE PHANLANX OF LEFT MIDDLE FINGER;  Surgeon: Cindee Salt, MD;  Location: [facility] OR;  Service: Orthopedics;  Laterality: Left;  AXILLARY BLOCK   DIAGNOSTIC LAPAROSCOPY  2015; 2017   lap hernia repair x2   ENDOVENOUS ABLATION SAPHENOUS VEIN W/ LASER Left 08/31/2021   endovenous laser ablation left greater saphenous vein and stab phlebectomy 10-20 incisions left leg by Lemar Livings MD   HALLUX FUSION Right 05/28/2022   Procedure: HALLUX FUSION METATARSAL PHALANGEAL JOIT;  Surgeon: Candelaria Stagers, DPM;  Location: [facility];  Service: Podiatry;  Laterality: Right;  BLOCK   HERNIA REPAIR  2014, 2018   umbilical hernia repair   IVC FILTER INSERTION  2003   POLYPECTOMY  06/05/2019   Procedure: POLYPECTOMY;  Surgeon: Jeani Hawking, MD;  Location: [facility];  Service: Endoscopy;;   SHOULDER ARTHROSCOPY W/ ROTATOR  CUFF REPAIR Right 08/28/2017   TOTAL ABDOMINAL HYSTERECTOMY  2003   UPPER GI ENDOSCOPY     WRIST ARTHROSCOPY WITH DEBRIDEMENT Left 10/18/2020   Procedure: LEFT WRIST ARTHROSCOPY WITH DEBRIDEMENT;  Surgeon: Cindee Salt, MD;  Location: [facility] OR;  Service: Orthopedics;  Laterality: Left;  AXILLARY BLOCK   WRIST ARTHROSCOPY WITH DEBRIDEMENT Right 01/25/2022   Procedure: ARTHROSCOPY RIGHT WRIST WITH  DEBRIDEMENT/ SHRINKAGE;  Surgeon: Betha Loa, MD;  Location: [facility] OR;  Service: Orthopedics;  Laterality: Right;    Current Medications: Current Meds  Medication Sig   acetaminophen (TYLENOL) 500 MG tablet Take 1,000 mg by mouth every 6 (six) hours as needed for moderate pain.   albuterol (VENTOLIN HFA) 108 (90 Base) MCG/ACT inhaler Inhale 2 puffs into the lungs every 6 (six) hours as needed for wheezing or shortness of breath.  atorvastatin (LIPITOR) 40 MG tablet Take 40 mg by mouth every evening.   bumetanide (BUMEX) 1 MG tablet Take 1 tablet (1 mg total) by mouth daily for 10 days. (Patient taking differently: Take 1 mg by mouth every morning.)   Cholecalciferol (VITAMIN D-3) 125 MCG (5000 UT) TABS Take 5,000 Units by mouth daily.   empagliflozin (JARDIANCE) 10 MG TABS tablet Take 1 tablet (10 mg total) by mouth daily.   metFORMIN (GLUCOPHAGE) 500 MG tablet Take 500 mg by mouth daily with breakfast.   Omega-3 Fatty Acids (FISH OIL) 1200 MG CAPS Take 1,200 mg by mouth daily with breakfast.   omeprazole (PRILOSEC) 40 MG capsule Take 40 mg by mouth 2 (two) times daily.   rivaroxaban (XARELTO) 20 MG TABS tablet Take 20 mg by mouth at bedtime.    RYBELSUS 14 MG TABS Take 14 mg by mouth in the morning.   sertraline (ZOLOFT) 50 MG tablet Take 50 mg by mouth in the morning.   [DISCONTINUED] flecainide (TAMBOCOR) 50 MG tablet Take 50 mg by mouth 2 (two) times daily.    [DISCONTINUED] metoprolol succinate (TOPROL-XL) 25 MG 24 hr tablet Take 1 tablet (25 mg total) by mouth daily. Take with or immediately  following a meal. (Patient taking differently: Take 25 mg by mouth every morning. Take with or immediately following a meal.)     Allergies:   Acetaminophen-codeine, Bee venom, Meloxicam, Tramadol, Codeine, Coconut (cocos nucifera), and Tomato   Social History   Socioeconomic History   Marital status: Divorced    Spouse name: Not on file   Number of children: 3   Years of education: Not on file   Highest education level: Not on file  Occupational History   Not on file  Tobacco Use   Smoking status: Never   Smokeless tobacco: Never  Vaping Use   Vaping status: Never Used  Substance and Sexual Activity   Alcohol use: Never   Drug use: Never   Sexual activity: Not Currently    Birth control/protection: Surgical    Comment: Hysterectomy  Other Topics Concern   Not on file  Social History Narrative   ** Merged History Encounter **       Social Determinants of Health   Financial Resource Strain: Not on file  Food Insecurity: No Food Insecurity (11/08/2022)   Hunger Vital Sign    Worried About Running Out of Food in the Last Year: Never true    Ran Out of Food in the Last Year: Never true  Transportation Needs: No Transportation Needs (11/08/2022)   PRAPARE - Transportation    Lack of Transportation (Medical): No    Lack of Transportation (Non-Medical): No  Physical Activity: Not on file  Stress: Not on file  Social Connections: Unknown (01/30/2022)   Received [facility]   Social Network    Social Network: Not on file     Family History: The patient's family history includes Breast cancer in her maternal grandmother and mother; Congestive Heart Failure in her father; Diabetes in her father; Heart attack in her father.  ROS:   Please see the history of present illness.     All other systems reviewed and are negative.  EKGs/Labs/Other Studies Reviewed:    The following studies were reviewed today:  Cardiology Studies: TTE 08/14/2022 - normal  EF, RV is normal, no significant valve dx, IVC is normal  EKG:  EKG is  ordered today.  The ekg ordered today demonstrates  07/19/2022- atrial flutter with variable AV block  Recent Labs: 12/31/2022: Hemoglobin 11.0; Magnesium 2.0; Platelets 245 02/19/2023: BUN 15; Creatinine, Ser 0.78; Potassium 3.7; Sodium 139   Recent Lipid Panel No results found for: "CHOL", "TRIG", "HDL", "CHOLHDL", "VLDL", "LDLCALC", "LDLDIRECT"   Risk Assessment/Calculations:    CHA2DS2-VASc Score = 5   This indicates a 7.2% annual risk of stroke. The patient's score is based upon: CHF History: 1 HTN History: 0 Diabetes History: 1 Stroke History: 2 Vascular Disease History: 0 Age Score: 0 Gender Score: 1       Physical Exam:    VS:  BP 110/72   Pulse 74   Ht 5\' 5"  (1.651 m)   Wt (!) 307 lb (139.3 kg)   SpO2 95%   BMI 51.09 kg/m     Wt Readings from Last 3 Encounters:  04/24/23 (!) 307 lb (139.3 kg)  02/25/23 (!) 314 lb (142.4 kg)  02/15/23 (!) 314 lb (142.4 kg)     GEN:  Well nourished, well developed in no acute distress HEENT: Normal NECK: No JVD; No carotid bruits LYMPHATICS: No lymphadenopathy CARDIAC: RRR, no murmurs, rubs, gallops RESPIRATORY:  Clear to auscultation without rales, wheezing or rhonchi  ABDOMEN: Soft, non-tender, non-distended MUSCULOSKELETAL:  No edema; No deformity  SKIN: Warm and dry NEUROLOGIC:  Alert and oriented x 3 PSYCHIATRIC:  Normal affect   ASSESSMENT and PLAN   Typical Atrial Flutter/Paroxysmal Atrial Fibrillation: Diagnosed after a stroke several years ago. Has flutter with variable block. She is mildly symptomatic with fatigue.  She's been on flecainide at least since 2021. S/p DCCV 08/22/2022. s/p CTI ablation 11/02/2022 - normal rhythm -continue xarelto 20 mg daily  -continue flecainide 50 mg BID  -cont metoprolol 25 mg XL - patient of Dr. Mercy Riding as well  HFpEF?: Unknown EF. Euvolemic today. Dry weight ~139 kg  - continue bumex 1 mg daily -  start SGLT2  Dm2: A1c at goal <7. On GLP1. Start jardiance 10 mg daily  HLD: lipitor 40 mg daily  She is acceptable cardiac risk for oral surgery; she can stop her xarelto for any duration prior or after the surgery  Follow up 12 months     Medication Adjustments/Labs and Tests Ordered: Current medicines are reviewed at length with the patient today.  Concerns regarding medicines are outlined above.  No orders of the defined types were placed in this encounter.  Meds ordered this encounter  Medications   flecainide (TAMBOCOR) 50 MG tablet    Sig: Take 1 tablet (50 mg total) by mouth 2 (two) times daily.    Dispense:  180 tablet    Refill:  3   metoprolol succinate (TOPROL-XL) 25 MG 24 hr tablet    Sig: Take 1 tablet (25 mg total) by mouth daily. Take with or immediately following a meal.    Dispense:  90 tablet    Refill:  3   empagliflozin (JARDIANCE) 10 MG TABS tablet    Sig: Take 1 tablet (10 mg total) by mouth daily.    Dispense:  90 tablet    Refill:  3    Patient Instructions  Medication Instructions:  START jardiance 10 Mg daily   *If you need a refill on your cardiac medications before your next appointment, please call your pharmacy*   Lab Work: None needed   If you have labs (blood work) drawn today and your tests are completely normal, you will receive your results only by: MyChart Message (if you have MyChart)  OR A paper copy in the mail If you have any lab test that is abnormal or we need to change your treatment, we will call you to review the results.   Testing/Procedures: None needed    Follow-Up: At [facility], you and your health needs are our priority.  As part of our continuing mission to provide you with exceptional heart care, we have created designated Provider Care Teams.  These Care Teams include your primary Cardiologist (physician) and Advanced Practice Providers (APPs -  Physician Assistants and Nurse Practitioners) who all  work together to provide you with the care you need, when you need it.   Your next appointment:   12 month(s)  Provider:   Maisie Fus, MD     Signed, Maisie Fus, MD  04/24/2023 10:10 AM     [facility]

## 2023-04-30 ENCOUNTER — Ambulatory Visit (HOSPITAL_COMMUNITY): Payer: 59 | Admitting: Licensed Clinical Social Worker

## 2023-05-01 ENCOUNTER — Ambulatory Visit (INDEPENDENT_AMBULATORY_CARE_PROVIDER_SITE_OTHER): Payer: 59

## 2023-05-01 ENCOUNTER — Ambulatory Visit (INDEPENDENT_AMBULATORY_CARE_PROVIDER_SITE_OTHER): Payer: 59 | Admitting: Podiatry

## 2023-05-01 DIAGNOSIS — Z9889 Other specified postprocedural states: Secondary | ICD-10-CM

## 2023-05-01 DIAGNOSIS — T85848A Pain due to other internal prosthetic devices, implants and grafts, initial encounter: Secondary | ICD-10-CM

## 2023-05-01 MED ORDER — OXYCODONE-ACETAMINOPHEN 5-325 MG PO TABS: 1.0000 | ORAL_TABLET | ORAL | 0 refills | Status: DC | PRN

## 2023-05-01 NOTE — Progress Notes (Signed)
Subjective:  Patient ID: Meghan [name], female    DOB: 1966/03/10,  MRN: [id number]  Chief Complaint  Patient presents with   Routine Post Op    POV#3 DOS 02/25/23 HALLUX MPJ FUSION LT    DOS: 02/25/2023 Procedure: Left total MPJ fusion  57 y.o. female returns for post-op check.  She states she is doing well denies any other acute complaints.  She is having pain to the right foot near the hardware.  She wanted to discuss treatment options for that.  Review of Systems: Negative except as noted in the HPI. Denies N/V/F/Ch.  Past Medical History:  Diagnosis Date   (HFpEF) heart failure with preserved ejection fraction (HCC) 01/2019   a.) Dx'd in [place], [place]; b.) TTE 08/14/2022: EF 60-65%, mod LVH, mild LAE, triv MR   Anxiety    Asthma    Atrial fibrillation and flutter (HCC) 02/16/2019   a.) CHA2DS2VASc = 6 (sex, HFpEF, HTN, CVA x2, T2DM);  b.) s/p DCCV (200 J x 1) 08/22/2022; c.) s/p RF ablation (CTI) 11/02/2022; d.) rate/rhythm maintained on oral flecainide + metoprolol succinate; chronically anticoagulated with rivaroxaban   Class 3 severe obesity due to excess calories with body mass index (BMI) of 50.0 to 59.9 in adult [facility])    DDD (degenerative disc disease), lumbar    GERD (gastroesophageal reflux disease)    Hiatal hernia    History of head injury    HTN (hypertension)    Hypercholesterolemia    Ileus (HCC)    Intractable nausea and vomiting 11/16/2020   Long term current use of anticoagulant    a.) rivaroxaban   Macular degeneration of left eye    MDD (major depressive disorder)    OA (osteoarthritis)    OSA on CPAP    Pneumonia    Presence of IVC filter    PTSD (post-traumatic stress disorder)    Recurrent pulmonary emboli (HCC)    a.) s/p IVC filter placement 2003; b.) previously followed by hematology when living in [place], [place]   Seizure disorder [facility])    a.) last was in 2020 per pt report   Stroke (HCC)    1989 and 1995 (left sided weakness)   T2DM (type  2 diabetes mellitus) (HCC)    Vitamin D deficiency     Current Outpatient Medications:    oxyCODONE-acetaminophen (PERCOCET) 5-325 MG tablet, Take 1 tablet by mouth every 4 (four) hours as needed for severe pain., Disp: 30 tablet, Rfl: 0   acetaminophen (TYLENOL) 500 MG tablet, Take 1,000 mg by mouth every 6 (six) hours as needed for moderate pain., Disp: , Rfl:    albuterol (VENTOLIN HFA) 108 (90 Base) MCG/ACT inhaler, Inhale 2 puffs into the lungs every 6 (six) hours as needed for wheezing or shortness of breath., Disp: , Rfl:    atorvastatin (LIPITOR) 40 MG tablet, Take 40 mg by mouth every evening., Disp: , Rfl:    bumetanide (BUMEX) 1 MG tablet, Take 1 tablet (1 mg total) by mouth daily for 10 days. (Patient taking differently: Take 1 mg by mouth every morning.), Disp: 10 tablet, Rfl: 0   Cholecalciferol (VITAMIN D-3) 125 MCG (5000 UT) TABS, Take 5,000 Units by mouth daily., Disp: , Rfl:    empagliflozin (JARDIANCE) 10 MG TABS tablet, Take 1 tablet (10 mg total) by mouth daily., Disp: 90 tablet, Rfl: 3   flecainide (TAMBOCOR) 50 MG tablet, Take 1 tablet (50 mg total) by mouth 2 (two) times daily., Disp: 180 tablet, Rfl: 3  levETIRAcetam (KEPPRA) 500 MG tablet, Take 1 tablet (500 mg total) by mouth 2 (two) times daily., Disp: 60 tablet, Rfl: 0   metFORMIN (GLUCOPHAGE) 500 MG tablet, Take 500 mg by mouth daily with breakfast., Disp: , Rfl:    metoprolol succinate (TOPROL-XL) 25 MG 24 hr tablet, Take 1 tablet (25 mg total) by mouth daily. Take with or immediately following a meal., Disp: 90 tablet, Rfl: 3   Omega-3 Fatty Acids (FISH OIL) 1200 MG CAPS, Take 1,200 mg by mouth daily with breakfast., Disp: , Rfl:    omeprazole (PRILOSEC) 40 MG capsule, Take 40 mg by mouth 2 (two) times daily., Disp: , Rfl:    rivaroxaban (XARELTO) 20 MG TABS tablet, Take 20 mg by mouth at bedtime. , Disp: , Rfl:    RYBELSUS 14 MG TABS, Take 14 mg by mouth in the morning., Disp: , Rfl:    sertraline (ZOLOFT) 50 MG  tablet, Take 50 mg by mouth in the morning., Disp: , Rfl:   Social History   Tobacco Use  Smoking Status Never  Smokeless Tobacco Never    Allergies  Allergen Reactions   Acetaminophen-Codeine Swelling, Rash and Other (See Comments)    Tylenol with Codeine, Tylenol #3,  (facial swelling, hives)   Bee Venom Anaphylaxis   Meloxicam Anaphylaxis and Rash   Tramadol Hives and Other (See Comments)    Patient stated "I was trippin' and I do not want that ever again"   Codeine Swelling    hives   Coconut (Cocos Nucifera) Rash and Other (See Comments)    ANY coconut products    Tomato Rash    Fresh    Objective:  There were no vitals filed for this visit. There is no height or weight on file to calculate BMI. Constitutional Well developed. Well nourished.  Vascular Foot warm and well perfused. Capillary refill normal to all digits.   Neurologic Normal speech. Oriented to person, place, and time. Epicritic sensation to light touch grossly present bilaterally.  Dermatologic Skin completely epithelialized.  No signs of Deis is noted no complication noted.  Good correction alignment noted of the toe.  Rigid first MPJ joint noted  Pain on palpation of right orthopedic hardware.  Rigid right first MPJ joint noted.  Orthopedic: No further tenderness to palpation noted about the surgical site.   Radiographs: 3 views of skeletally mature left foot: Hardware is intact no signs of backing or loosening noted.  Reduction of deformity noted Assessment:   1. Post-operative state   2. Status post surgery   3. Pain from implanted hardware, initial encounter     Plan:  Patient was evaluated and treated and all questions answered.  S/p foot surgery left -Clinically healed and doing really well.  Patient has healed surgical site.  Right painful orthopedic hardware -I explained to the patient the etiology of painful hardware and worse treatment options were discussed.  I discussed with her  she will benefit from a CT scan to assess for fusion of the joint to the right side if it has completely consolidated we will plan on hardware removal.  She agrees with the plan -CT scan ordered  No follow-ups on file.

## 2023-05-08 ENCOUNTER — Ambulatory Visit (INDEPENDENT_AMBULATORY_CARE_PROVIDER_SITE_OTHER): Payer: 59 | Admitting: Licensed Clinical Social Worker

## 2023-05-08 DIAGNOSIS — F331 Major depressive disorder, recurrent, moderate: Secondary | ICD-10-CM | POA: Diagnosis not present

## 2023-05-08 DIAGNOSIS — F4310 Post-traumatic stress disorder, unspecified: Secondary | ICD-10-CM

## 2023-05-08 DIAGNOSIS — F411 Generalized anxiety disorder: Secondary | ICD-10-CM

## 2023-05-08 NOTE — Progress Notes (Signed)
Virtual Visit via Video Note   I connected with Meghan [name] on 05/08/23 at 3:00pm by video enabled telemedicine application and verified that I am speaking with the correct person using two identifiers.   I discussed the limitations, risks, security and privacy concerns of performing an evaluation and management service by video and the availability of in person appointments. I also discussed with the patient that there may be a patient responsible charge related to this service. The patient expressed understanding and agreed to proceed.   I discussed the assessment and treatment plan with the patient. The patient was provided an opportunity to ask questions and all were answered. The patient agreed with the plan and demonstrated an understanding of the instructions.   The patient was advised to call back or seek an in-person evaluation if the symptoms worsen or if the condition fails to improve as anticipated.   I provided 35 minutes of non-face-to-face time during this encounter.     Meghan Stain, LCSW, LCAS _______________________ THERAPIST PROGRESS NOTE   Session Time:  3:00pm - 3:35pm   Location: Patient: Patient Home Provider: [facility]   Participation Level: Active    Behavioral Response: Alert, casually dressed, euthymic mood/affect   Type of Therapy:  Individual Therapy   Treatment Goals addressed: Mood management; Medication management; Managing boundaries with support network  Progress Towards Goals: Progressing   Interventions: CBT, stress management    Summary: Meghan [name] is a 57 year old divorced African American female that presented for therapy appointment today with diagnoses of Major Depressive Disorder, recurrent, moderate, Generalized Anxiety Disorder and PTSD.      Suicidal/Homicidal: None; without plan or intent                                                                                                                Therapist Response:   Clinician met with Meghan [name] for virtual therapy session and assessed for safety, sobriety, and medication compliance.  Meghan [name] presented for appointment on time and was alert, oriented x5, with no evidence or self-report of active SI/HI or A/V H.  Meghan [name] reported ongoing compliance with medication and denied any use of alcohol or illicit substances.  Clinician inquired about Meghan [name]'s emotional ratings today, as well as any significant changes in thoughts, feelings, or behavior since last check-in.  Meghan [name] reported scores of 0/10 for depression, 0/10 for anxiety, and 0/10 for anger/irritability.  Meghan [name] denied any recent panic attacks or outbursts.  Meghan [name] reported that a recent success was having a friend surprise her with a visit and 'kidnap' her for a few days on a trip back to [place] to see friends, family, and go sight seeing.  Meghan [name] reported that she was back in [name] today, and stated "I had a lot of fun".  She reported that one struggle has been trying to manage stress on some days, as she currently has a heart monitor installed, and it will beep at her if she crosses a certain threshold.  Clinician utilized a Statistician  today on stress management to assist.  Clinician virtually shared this handout which emphasized the importance of increasing awareness into stressors which can negatively affect one's emotional state.  Clinician tasked Meghan [name] with going through the list in order to identify present stressors, with listed examples such as world economy, money worries, health changes, family conflict, and more.  Clinician then tasked Meghan [name] with sorting identified stressors by importance, and whether or not she has any control over them.  Clinician encouraged Meghan [name] to let go of stressors identified as not important, put energy towards areas she can actually control, and use previously discussed stress management tools (i.e. deep breathing, guided imagery, etc) to distract herself from concerns which are important, but not within her  control.  Intervention was effective, as evidenced by Meghan [name]'s active participation in activity, and successful identification of stressors affecting mood at present such as recovering from recent surgery, thinking about returning back to work, and managing boundaries with family members that can bring up subjects she doesn't want to discuss.  Meghan [name] reported that she will try to focus on what she can actually control, so she will reestablish boundaries with family members that challenge her emotional and intellectual boundaries, and ensure adequate time for self-care activities in upcoming week as an outlet for stress, such as attending church, going to a women's retreat, and planning for an upcoming vacation to [place].  Clinician will continue to monitor.           Plan: Follow up again in 1 week.    Diagnosis: Major depressive disorder, recurrent, moderate; Generalized Anxiety Disorder; and PTSD   Collaboration of Care:   No collaboration of care required for this visit.                                                   Patient/Guardian was advised Release of Information must be obtained prior to any record release in order to collaborate their care with an outside provider. Patient/Guardian was advised if they have not already done so to contact the registration department to sign all necessary forms in order for [name] to release information regarding their care.    Consent: Patient/Guardian gives verbal consent for treatment and assignment of benefits for services provided during this visit. Patient/Guardian expressed understanding and agreed to proceed.  Meghan Stain, LCSW, LCAS 05/08/23

## 2023-05-15 ENCOUNTER — Ambulatory Visit (HOSPITAL_COMMUNITY): Payer: 59 | Admitting: Licensed Clinical Social Worker

## 2023-05-15 DIAGNOSIS — F4310 Post-traumatic stress disorder, unspecified: Secondary | ICD-10-CM | POA: Diagnosis not present

## 2023-05-15 DIAGNOSIS — F331 Major depressive disorder, recurrent, moderate: Secondary | ICD-10-CM | POA: Diagnosis not present

## 2023-05-15 DIAGNOSIS — F411 Generalized anxiety disorder: Secondary | ICD-10-CM | POA: Diagnosis not present

## 2023-05-15 NOTE — Progress Notes (Signed)
Virtual Visit via Video Note   I connected with Meghan [name] on 05/15/23 at 3:00pm by video enabled telemedicine application and verified that I am speaking with the correct person using two identifiers.   I discussed the limitations, risks, security and privacy concerns of performing an evaluation and management service by video and the availability of in person appointments. I also discussed with the patient that there may be a patient responsible charge related to this service. The patient expressed understanding and agreed to proceed.   I discussed the assessment and treatment plan with the patient. The patient was provided an opportunity to ask questions and all were answered. The patient agreed with the plan and demonstrated an understanding of the instructions.   The patient was advised to call back or seek an in-person evaluation if the symptoms worsen or if the condition fails to improve as anticipated.   I provided 35 minutes of non-face-to-face time during this encounter.   Meghan Stain, LCSW, LCAS _______________________ THERAPIST PROGRESS NOTE   Session Time:  3:00pm - 3:35pm   Location: Patient: Patient Home Provider: [facility]   Participation Level: Active    Behavioral Response: Alert, casually dressed, euthymic mood/affect   Type of Therapy:  Individual Therapy   Treatment Goals addressed: Mood management; Medication management; Managing boundaries with support network  Progress Towards Goals: Progressing   Interventions: CBT, problem solving    Summary: Meghan [name] is a 57 year old divorced African American female that presented for therapy appointment today with diagnoses of Major Depressive Disorder, recurrent, moderate, Generalized Anxiety Disorder and PTSD.      Suicidal/Homicidal: None; without plan or intent                                                                                                                Therapist Response:  Clinician met  with Meghan [name] for virtual therapy appointment and assessed for safety, sobriety, and medication compliance.  Meghan [name] presented for session on time and was alert, oriented x5, with no evidence or self-report of active SI/HI or A/V H.  [name] reported ongoing compliance with medication and denied any use of alcohol or illicit substances.  Clinician inquired about Meghan [name]'s current emotional ratings, as well as any significant changes in thoughts, feelings, or behavior since previous check-in.  Meghan [name] reported scores of 0/10 for depression, 0/10 for anxiety, and 0/10 for anger/irritability.  Meghan [name] denied any recent panic attacks or outbursts.  Meghan [name] reported that a recent success was having a busy past week spent taking care of laundry and groceries, visiting church with family and friends, attending a women's retreat, and volunteering at a food truck festival.  Meghan [name] reported that a recent struggle has been having dreams about her ex-husband, who has attempted several times to make up with her.  Meghan [name] reported that some family members have also put pressure on her to "Find a man" since they worry about her being single too long.  Clinician assisted Meghan [name] in running a cost benefit analysis  regarding whether it would be in her best interests to seek a relationship with her ex, or another partner based upon how this would impact her overall wellness.  Clinician also reviewed subject of healthy relationships, with emphasis on differences between 'red flag' behaviors that indicate someone could be unhealthy to spend time with, versus someone who displays positive qualities like honesty, healthy conflict resolution, empathy, and respect.  Intervention was effective, as evidenced by Meghan [name]'s active engagement in discussion on topic, reporting that this helped her reflect upon toxic traits her ex-husband displayed in the past that reinforced her decision to move away from [place] and their marriage.  Meghan [name] reported that she will strengthen  boundaries with her ex to limit need to interact with him, and continue to focus on herself, as she does not feel ready for a new relationship at this time.  Meghan [name] reported that she would focus on her self-care this week, and stated "I've been doing and doing and doing for everybody else, but not for me".  Clinician will continue to monitor.           Plan: Follow up again in 1 week.    Diagnosis: Major depressive disorder, recurrent, moderate; Generalized Anxiety Disorder; and PTSD   Collaboration of Care:   No collaboration of care required for this visit.                                                   Patient/Guardian was advised Release of Information must be obtained prior to any record release in order to collaborate their care with an outside provider. Patient/Guardian was advised if they have not already done so to contact the registration department to sign all necessary forms in order for [name] to release information regarding their care.    Consent: Patient/Guardian gives verbal consent for treatment and assignment of benefits for services provided during this visit. Patient/Guardian expressed understanding and agreed to proceed.  Meghan Stain, LCSW, LCAS 05/15/23

## 2023-05-28 ENCOUNTER — Ambulatory Visit
Admission: RE | Admit: 2023-05-28 | Discharge: 2023-05-28 | Disposition: A | Discharge status: Home / Self Care | Payer: 59 | Source: Ambulatory Visit | Attending: Podiatry | Admitting: Podiatry

## 2023-05-28 DIAGNOSIS — T85848A Pain due to other internal prosthetic devices, implants and grafts, initial encounter: Secondary | ICD-10-CM

## 2023-05-29 ENCOUNTER — Ambulatory Visit (INDEPENDENT_AMBULATORY_CARE_PROVIDER_SITE_OTHER): Payer: 59 | Admitting: Licensed Clinical Social Worker

## 2023-05-29 DIAGNOSIS — F331 Major depressive disorder, recurrent, moderate: Secondary | ICD-10-CM | POA: Diagnosis not present

## 2023-05-29 DIAGNOSIS — F411 Generalized anxiety disorder: Secondary | ICD-10-CM | POA: Diagnosis not present

## 2023-05-29 DIAGNOSIS — F4310 Post-traumatic stress disorder, unspecified: Secondary | ICD-10-CM | POA: Diagnosis not present

## 2023-05-29 NOTE — Progress Notes (Signed)
Virtual Visit via Video Note   I connected with Meghan [name] on 05/29/23 at 3:00pm by video enabled telemedicine application and verified that I am speaking with the correct person using two identifiers.   I discussed the limitations, risks, security and privacy concerns of performing an evaluation and management service by video and the availability of in person appointments. I also discussed with the patient that there may be a patient responsible charge related to this service. The patient expressed understanding and agreed to proceed.   I discussed the assessment and treatment plan with the patient. The patient was provided an opportunity to ask questions and all were answered. The patient agreed with the plan and demonstrated an understanding of the instructions.   The patient was advised to call back or seek an in-person evaluation if the symptoms worsen or if the condition fails to improve as anticipated.   I provided 30 minutes of non-face-to-face time during this encounter.   Noralee Stain, LCSW, LCAS _______________________ THERAPIST PROGRESS NOTE   Session Time:  3:00pm - 3:30pm    Location: Patient: Patient Home Provider: [facility]   Participation Level: Active    Behavioral Response: Alert, casually dressed, irritable mood/affect   Type of Therapy:  Individual Therapy   Treatment Goals addressed: Mood management; Medication management; Managing boundaries with support network; Maintaining employment   Progress Towards Goals: Progressing   Interventions: CBT, problem solving, communication skills      Summary: Meghan [name] is a 57 year old divorced African American female that presented for therapy appointment today with diagnoses of Major Depressive Disorder, recurrent, moderate, Generalized Anxiety Disorder and PTSD.      Suicidal/Homicidal: None; without plan or intent                                                                                                                 Therapist Response:  Clinician met with Meghan [name] for virtual therapy session and assessed for safety, sobriety, and medication compliance.  Meghan [name] presented for appointment on time and was alert, oriented x5, with no evidence or self-report of active SI/HI or A/V H.  Meghan [name] reported ongoing compliance with medication and denied any use of alcohol or illicit substances.  Clinician inquired about Meghan [name]'s emotional ratings today, as well as any significant changes in thoughts, feelings, or behavior since last check-in.  Meghan [name] reported scores of 0/10 for depression, 0/10 for anxiety, and 3/10 for irritability.  Meghan [name] denied any recent panic attacks or outbursts.  Meghan [name] reported that a recent struggle has been having trouble sleeping at night, noting that she is averaging 3 hours most nights.  Clinician inquired about recent triggers or schedule changes which could explain Meghan [name]'s lack of quality sleep.  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  She reported that she is also tired of trying to assist one of her daughter's with various problems  she calls her about daily, stating "I'm getting to that point where I just don't want to be bothered by anyone's calls".  Clinician encouraged Meghan [name] not to get discouraged and isolate herself as she did in the past.  Clinician reviewed material with Meghan [name] today on communication skills which could be utilized to increase understanding and support with both her daughter, and staff at her job in order to avoid burnout.  Clinician presented a handout on 'soft startups' which offered suggestions on how Meghan [name] could address a problem assertively with members of her network, including tips such as choosing an appropriate time/setting, being mindful of maintaining gentle tone, volume and language, while avoiding triggering nonverbals such as rolling eyes, as well  as utilizing "I" statements to express feelings, focusing on one problem at a time, and being respectful.   Intervention was effective, as evidenced by Meghan [name]'s active engagement in discussion on subject, and receptiveness to suggestions offered in improving communication, reporting that she would plan to reestablish boundaries with her children in a respectful, but direct way, and vocalize needs to employer to avoid additional burnout symptoms from arising and taxing both mental and physical wellbeing.  Meghan [name] asked to end session early due to fatigue from lack of sleep.  Clinician was agreeable to this request, and will continue to monitor.           Plan: Follow up again in 1 week.    Diagnosis: Major depressive disorder, recurrent, moderate; Generalized Anxiety Disorder; and PTSD   Collaboration of Care:   No collaboration of care required for this visit.                                                   Patient/Guardian was advised Release of Information must be obtained prior to any record release in order to collaborate their care with an outside provider. Patient/Guardian was advised if they have not already done so to contact the registration department to sign all necessary forms in order for [name] to release information regarding their care.    Consent: Patient/Guardian gives verbal consent for treatment and assignment of benefits for services provided during this visit. Patient/Guardian expressed understanding and agreed to proceed.  Noralee Stain, [place], LCAS 05/29/23

## 2023-06-05 ENCOUNTER — Ambulatory Visit (INDEPENDENT_AMBULATORY_CARE_PROVIDER_SITE_OTHER): Payer: 59 | Admitting: Licensed Clinical Social Worker

## 2023-06-05 DIAGNOSIS — F331 Major depressive disorder, recurrent, moderate: Secondary | ICD-10-CM | POA: Diagnosis not present

## 2023-06-05 DIAGNOSIS — F4310 Post-traumatic stress disorder, unspecified: Secondary | ICD-10-CM

## 2023-06-05 DIAGNOSIS — F411 Generalized anxiety disorder: Secondary | ICD-10-CM

## 2023-06-05 NOTE — Progress Notes (Signed)
Virtual Visit via Video Note   I connected with Raisa S. Moxon on 06/05/23 at 3:00pm by video enabled telemedicine application and verified that I am speaking with the correct person using two identifiers.   I discussed the limitations, risks, security and privacy concerns of performing an evaluation and management service by video and the availability of in person appointments. I also discussed with the patient that there may be a patient responsible charge related to this service. The patient expressed understanding and agreed to proceed.   I discussed the assessment and treatment plan with the patient. The patient was provided an opportunity to ask questions and all were answered. The patient agreed with the plan and demonstrated an understanding of the instructions.   The patient was advised to call back or seek an in-person evaluation if the symptoms worsen or if the condition fails to improve as anticipated.   I provided 34 minutes of non-face-to-face time during this encounter.   Noralee Stain, LCSW, LCAS _______________________ THERAPIST PROGRESS NOTE   Session Time:  3:00pm - 3:34pm   Location: Patient: Patient Home Provider: [facility]   Participation Level: Active    Behavioral Response: Alert, casually dressed, angry/irritable mood/affect   Type of Therapy:  Individual Therapy   Treatment Goals addressed: Mood management; Medication management; Maintaining employment   Progress Towards Goals: Progressing   Interventions: CBT, sleep hygiene techniques       Summary: Karman S. Last is a 57 year old divorced African American female that presented for therapy appointment today with diagnoses of Major Depressive Disorder, recurrent, moderate, Generalized Anxiety Disorder and PTSD.      Suicidal/Homicidal: None; without plan or intent                                                                                                                Therapist Response:  Clinician  met with Ailey for virtual therapy appointment and assessed for safety, sobriety, and medication compliance.  Charie presented for session on time and was alert, oriented x5, with no evidence or self-report of active SI/HI or A/V H.  Jnya reported ongoing compliance with medication and denied any use of alcohol or illicit substances.  Clinician inquired about Natally's current emotional ratings, as well as any significant changes in thoughts, feelings, or behavior since previous check-in.  Myracle reported scores of 0/10 for depression, 0/10 for anxiety, and 5/10 for anger/irritability.  Jama denied any recent panic attacks or outbursts.  Dewanda reported that a recent success was staying calm the other day when she received distressing news about a family member.  She reported that work has become more manageable as well since having more volunteers available on shift.  Medrith reported that she has been having trouble sleeping well since starting her job again, which can impact mood and energy in a negative way some days.  Clinician revisited psychoeducation on topic of sleep hygiene with [name] today, defining this as the healthy habits, behaviors, and environmental factors that can be adjusted to  improve quality of one's rest at night.  Clinician discussed various techniques with Chella today that could be implemented to improve her sleep habits, including sticking to a normal wake/sleep routine, avoiding using of electronics or consumption of heavy meals too close to bedtime, ensuring regular exercise routine during the day to increase fatigue before bedtime, making changes to bedroom environment as needed (i.e. blackout curtains, sound machine, etc), using a sleep journal to track effectiveness of changes, avoiding naps during the daytime, ensuring use of sleep medication as directed by psychiatrist, and more.  Intervention was effective, as evidenced by Luster Landsberg actively participating in discussion on subject, and  reporting increased insight into changes she could make to improve overall sleep quality, such as getting back into a regular sleep/wake cycle following return from recent trip, avoiding use of TV and phone close to bedtime, listening to calming nature sounds as a distraction, hanging blackout curtains to cut down on invasive light, getting back into a light exercise routine to ensure she feels tired at night, cutting back on daytime naps, and avoid watching the clock on the microwave when she is restless. Taydem stated "Its getting better, but there is still some work I could do".  Clinician continue to monitor.           Plan: Follow up again in 1 week.    Diagnosis: Major depressive disorder, recurrent, moderate; Generalized Anxiety Disorder; and PTSD   Collaboration of Care:   No collaboration of care required for this visit.                                                   Patient/Guardian was advised Release of Information must be obtained prior to any record release in order to collaborate their care with an outside provider. Patient/Guardian was advised if they have not already done so to contact the registration department to sign all necessary forms in order for [name] to release information regarding their care.    Consent: Patient/Guardian gives verbal consent for treatment and assignment of benefits for services provided during this visit. Patient/Guardian expressed understanding and agreed to proceed.  Noralee Stain, LCSW, LCAS 06/05/23

## 2023-06-06 ENCOUNTER — Ambulatory Visit (HOSPITAL_COMMUNITY)
Admission: RE | Admit: 2023-06-06 | Discharge: 2023-06-06 | Disposition: A | Discharge status: Home / Self Care | Payer: 59 | Source: Ambulatory Visit | Attending: Internal Medicine | Admitting: Internal Medicine

## 2023-06-06 ENCOUNTER — Encounter (HOSPITAL_COMMUNITY): Payer: Self-pay

## 2023-06-06 VITALS — BP 117/79 | HR 73 | Temp 97.9°F | Resp 18 | Ht 65.0 in | Wt 304.0 lb

## 2023-06-06 DIAGNOSIS — B369 Superficial mycosis, unspecified: Secondary | ICD-10-CM | POA: Diagnosis not present

## 2023-06-06 MED ORDER — MICONAZOLE NITRATE 2 % EX AERP: INHALATION_SPRAY | CUTANEOUS | 0 refills | Status: DC

## 2023-06-06 MED ORDER — TERBINAFINE HCL 250 MG PO TABS: 250.0000 mg | ORAL_TABLET | Freq: Every day | ORAL | 0 refills | Status: AC

## 2023-06-06 NOTE — ED Triage Notes (Addendum)
"  I have an unusual rash under both breast's and under my stomach near my vaginally area plus I have some tightness in my chest.  Just wanted to check that out as well. - Entered by patient"  Onset of rash she noticed on this past Sunday due to the itching. Patient tried some ointment that helped with the pain. States the rash is red, painful, and itchy.   Patient had some chest tightness/congestion. States it felt similar to when she had pneumonia but no current cough or discomfort. Patient states also has history of bad reflux.

## 2023-06-06 NOTE — Discharge Instructions (Addendum)
Please apply the powder under your breast and in the groin area twice a day Please take medications as directed If you have worsening symptoms please return to urgent care to be reevaluated.

## 2023-06-07 NOTE — ED Provider Notes (Signed)
[facility]    CSN: [id number] Arrival date & time: 06/06/23  1114      History   Chief Complaint Chief Complaint  Patient presents with   Appointment   Rash    Breast and vaginal    HPI Meghan [name] is a 57 y.o. female comes to the urgent care with itchy rash under both breasts and in groin area.  Patient noticed a rash over a week ago.  The rash is itchy, red and painful.  No changes in cosmetics, soaps or detergents.  She has been cleaning the groin area and under the breast with some alcohol wipes.  No fever or chills.  Patient complaining of some chest tightness and congestion but denies any shortness of breath, wheezing or chest pain.  She has a history of gastroesophageal reflux disease. HPI  Past Medical History:  Diagnosis Date   (HFpEF) heart failure with preserved ejection fraction (HCC) 01/2019   a.) Dx'd in [place], [place]; b.) TTE 08/14/2022: EF 60-65%, mod LVH, mild LAE, triv MR   Anxiety    Asthma    Atrial fibrillation and flutter (HCC) 02/16/2019   a.) CHA2DS2VASc = 6 (sex, HFpEF, HTN, CVA x2, T2DM);  b.) s/p DCCV (200 J x 1) 08/22/2022; c.) s/p RF ablation (CTI) 11/02/2022; d.) rate/rhythm maintained on oral flecainide + metoprolol succinate; chronically anticoagulated with rivaroxaban   Class 3 severe obesity due to excess calories with body mass index (BMI) of 50.0 to 59.9 in adult [facility])    DDD (degenerative disc disease), lumbar    GERD (gastroesophageal reflux disease)    Hiatal hernia    History of head injury    HTN (hypertension)    Hypercholesterolemia    Ileus (HCC)    Intractable nausea and vomiting 11/16/2020   Long term current use of anticoagulant    a.) rivaroxaban   Macular degeneration of left eye    MDD (major depressive disorder)    OA (osteoarthritis)    OSA on CPAP    Pneumonia    Presence of IVC filter    PTSD (post-traumatic stress disorder)    Recurrent pulmonary emboli (HCC)    a.) s/p IVC filter placement 2003; b.)  previously followed by hematology when living in [place], [place]   Seizure disorder [facility])    a.) last was in 2020 per pt report   Stroke (HCC)    1989 and 1995 (left sided weakness)   T2DM (type 2 diabetes mellitus) (HCC)    Vitamin D deficiency     Patient Active Problem List   Diagnosis Date Noted   Vascular complication 11/07/2022   MDD (major depressive disorder), recurrent episode, moderate (HCC) 07/30/2022   GAD (generalized anxiety disorder) 07/30/2022   PTSD (post-traumatic stress disorder) 07/30/2022   Insomnia 05/24/2022   Arthritis of first metatarsophalangeal (MTP) joint of right foot 04/25/2022   Intractable nausea and vomiting 11/16/2020   Ileus (HCC) 11/14/2020   Fecal impaction (HCC) 11/14/2020   Left-sided weakness 09/29/2020   Asthma 09/29/2020   Pre-diabetes 09/29/2020   Pain in left finger(s) 07/12/2020   Seizure-like activity (HCC) 03/25/2020   History of head injury 03/25/2020   Obstructive sleep apnea syndrome 09/04/2019   Acute medial meniscus tear of right knee 07/10/2019   Class 3 severe obesity due to excess calories with serious comorbidity and body mass index (BMI) of 50.0 to 59.9 in adult [facility]) 06/01/2019   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   Atrial fibrillation (HCC) 04/17/2019  History of pulmonary embolus (PE) 04/17/2019    Past Surgical History:  Procedure Laterality Date   A-FLUTTER ABLATION N/A 11/02/2022   Procedure: A-FLUTTER ABLATION;  Surgeon: Mealor, Roberts Gaudy, MD;  [facility];  Service: Cardiovascular;  Laterality: N/A;   ARTHRODESIS METATARSALPHALANGEAL JOINT (MTPJ) Left 02/25/2023   Procedure: ARTHRODESIS METATARSALPHALANGEAL JOINT (MTPJ);  Surgeon: Candelaria Stagers, DPM;  Location: [facility];  Service: Podiatry;  Laterality: Left;   CARDIAC CATHETERIZATION  2017   in [facility] in [place]   CARDIOVERSION N/A 08/22/2022   Procedure: CARDIOVERSION;  Surgeon: Thomasene Ripple, DO;  [facility];   Service: Cardiovascular;  Laterality: N/A;   CESAREAN SECTION N/A 1989   CESAREAN SECTION N/A 1993   CHOLECYSTECTOMY  2003   COLONOSCOPY WITH PROPOFOL N/A 06/05/2019   Procedure: COLONOSCOPY WITH PROPOFOL;  Surgeon: Jeani Hawking, MD;  Location: [facility];  Service: Endoscopy;  Laterality: N/A;   CYST REMOVAL WITH BONE GRAFT Left 10/18/2020   Procedure: BONE GRAFTING OF ENCHONDROMA MIDDLE PHANLANX OF LEFT MIDDLE FINGER;  Surgeon: Cindee Salt, MD;  Location: [facility];  Service: Orthopedics;  Laterality: Left;  AXILLARY BLOCK   DIAGNOSTIC LAPAROSCOPY  2015; 2017   lap hernia repair x2   ENDOVENOUS ABLATION SAPHENOUS VEIN W/ LASER Left 08/31/2021   endovenous laser ablation left greater saphenous vein and stab phlebectomy 10-20 incisions left leg by Lemar Livings MD   HALLUX FUSION Right 05/28/2022   Procedure: HALLUX FUSION METATARSAL PHALANGEAL JOIT;  Surgeon: Candelaria Stagers, DPM;  Location: [facility];  Service: Podiatry;  Laterality: Right;  BLOCK   HERNIA REPAIR  2014, 2018   umbilical hernia repair   IVC FILTER INSERTION  2003   POLYPECTOMY  06/05/2019   Procedure: POLYPECTOMY;  Surgeon: Jeani Hawking, MD;  [facility]: [facility];  Service: Endoscopy;;   SHOULDER ARTHROSCOPY W/ ROTATOR CUFF REPAIR Right 08/28/2017   TOTAL ABDOMINAL HYSTERECTOMY  2003   UPPER GI ENDOSCOPY     WRIST ARTHROSCOPY WITH DEBRIDEMENT Left 10/18/2020   Procedure: LEFT WRIST ARTHROSCOPY WITH DEBRIDEMENT;  Surgeon: Cindee Salt, MD;  [facility];  Service: Orthopedics;  Laterality: Left;  AXILLARY BLOCK   WRIST ARTHROSCOPY WITH DEBRIDEMENT Right 01/25/2022   Procedure: ARTHROSCOPY RIGHT WRIST WITH  DEBRIDEMENT/ SHRINKAGE;  Surgeon: Betha Loa, MD;  [facility];  Service: Orthopedics;  Laterality: Right;    OB History   No obstetric history on file.      Home Medications    Prior to Admission medications   Medication Sig Start Date End Date Taking? Authorizing Provider   atorvastatin (LIPITOR) 40 MG tablet Take 40 mg by mouth every evening. 07/26/21  Yes [provider]  bumetanide (BUMEX) 1 MG tablet Take 1 tablet (1 mg total) by mouth daily for 10 days. Patient taking differently: Take 1 mg by mouth every morning. 12/25/22 06/06/23 Yes White, Adrienne R, NP  Cholecalciferol (VITAMIN D-3) 125 MCG (5000 UT) TABS Take 5,000 Units by mouth daily.   Yes [provider]  metFORMIN (GLUCOPHAGE) 500 MG tablet Take 500 mg by mouth daily with breakfast.   Yes [provider]  Miconazole Nitrate 2 % AERP Apply powder to the affected area twice daily. 06/06/23  Yes Tamari Redwine, Britta Mccreedy, MD  Omega-3 Fatty Acids (FISH OIL) 1200 MG CAPS Take 1,200 mg by mouth daily with breakfast.   Yes [provider]  omeprazole (PRILOSEC) 40 MG capsule Take 40 mg by mouth 2 (two) times daily.  08/17/20  Yes [provider]  rivaroxaban (XARELTO) 20 MG TABS tablet Take 20 mg by mouth at bedtime.    Yes [provider]  RYBELSUS 14 MG TABS Take 14 mg by mouth in the morning. 07/27/21  Yes [provider]  sertraline (ZOLOFT) 50 MG tablet Take 50 mg by mouth in the morning. 01/01/21  Yes [provider]  terbinafine (LAMISIL) 250 MG tablet Take 1 tablet (250 mg total) by mouth daily for 7 days. 06/06/23 06/13/23 Yes Wilhelm Ganaway, Britta Mccreedy, MD  acetaminophen (TYLENOL) 500 MG tablet Take 1,000 mg by mouth every 6 (six) hours as needed for moderate pain.    [provider]  albuterol (VENTOLIN HFA) 108 (90 Base) MCG/ACT inhaler Inhale 2 puffs into the lungs every 6 (six) hours as needed for wheezing or shortness of breath.    [provider]  empagliflozin (JARDIANCE) 10 MG TABS tablet Take 1 tablet (10 mg total) by mouth daily. 04/24/23   Maisie Fus, MD  flecainide (TAMBOCOR) 50 MG tablet Take 1 tablet (50 mg total) by mouth 2 (two) times daily. 04/24/23   Maisie Fus, MD  levETIRAcetam (KEPPRA) 500 MG tablet Take 1 tablet  (500 mg total) by mouth 2 (two) times daily. 12/31/22 02/25/23  Terald Sleeper, MD  metoprolol succinate (TOPROL-XL) 25 MG 24 hr tablet Take 1 tablet (25 mg total) by mouth daily. Take with or immediately following a meal. 04/24/23   Maisie Fus, MD  oxyCODONE-acetaminophen (PERCOCET) 5-325 MG tablet Take 1 tablet by mouth every 4 (four) hours as needed for severe pain. 05/01/23   Candelaria Stagers, DPM    Family History Family History  Problem Relation Age of Onset   Breast cancer Mother    Heart attack Father    Diabetes Father    Congestive Heart Failure Father    Breast cancer Maternal Grandmother     Social History Social History   Tobacco Use   Smoking status: Never   Smokeless tobacco: Never  Vaping Use   Vaping status: Never Used  Substance Use Topics   Alcohol use: Never   Drug use: Never     Allergies   Acetaminophen-codeine, Bee venom, Meloxicam, Tramadol, Codeine, Coconut (cocos nucifera), and Tomato   Review of Systems Review of Systems As per HPI  Physical Exam Triage Vital Signs ED Triage Vitals  Encounter Vitals Group     BP 06/06/23 1126 117/79     Systolic BP Percentile --      Diastolic BP Percentile --      Pulse Rate 06/06/23 1126 73     Resp 06/06/23 1126 18     Temp 06/06/23 1126 97.9 F (36.6 C)     Temp Source 06/06/23 1126 Oral     SpO2 06/06/23 1126 96 %     Weight 06/06/23 1126 (!) 304 lb (137.9 kg)     Height 06/06/23 1126 5\' 5"  (1.651 m)     Head Circumference --      Peak Flow --      Pain Score 06/06/23 1125 5     Pain Loc --      Pain Education --      Exclude from Growth Chart --    No data found.  Updated Vital Signs BP 117/79 (BP Location: Left Arm)   Pulse 73   Temp 97.9 F (36.6 C) (Oral)   Resp 18   Ht 5\' 5"  (1.651 m)   Wt Marland Kitchen)  137.9 kg   SpO2 96%   BMI 50.59 kg/m   Visual Acuity Right Eye Distance:   Left Eye Distance:   Bilateral Distance:    Right Eye Near:   Left Eye Near:    Bilateral Near:      Physical Exam Vitals and nursing note reviewed. Exam conducted with a chaperone present.  Constitutional:      General: She is not in acute distress.    Appearance: Normal appearance. She is obese. She is not ill-appearing.  Cardiovascular:     Rate and Rhythm: Normal rate and regular rhythm.  Pulmonary:     Effort: Pulmonary effort is normal.     Breath sounds: Normal breath sounds.  Abdominal:     General: Bowel sounds are normal.     Palpations: Abdomen is soft.     Comments: Erythema under both breasts.  No pustules or vesicles noted.  Erythema in the groin area.  No significant drainage.  Patient has stomach pannus and the erythema is mainly under the pannus.  Neurological:     Mental Status: She is alert.      UC Treatments / Results  Labs (all labs ordered are listed, but only abnormal results are displayed) Labs Reviewed - No data to display  EKG   Radiology No results found.  Procedures Procedures (including critical care time)  Medications Ordered in UC Medications - No data to display  Initial Impression / Assessment and Plan / UC Course  I have reviewed the triage vital signs and the nursing notes.  Pertinent labs & imaging results that were available during my care of the patient were reviewed by me and considered in my medical decision making (see chart for details).     1.  Fungal dermatitis under the breast in the groin area: Lamisil 250 mg orally daily for 7 days Miconazole powder twice daily Return precautions given. Lung exam is unimpressive.  No further workup indicated. Final Clinical Impressions(s) / UC Diagnoses   Final diagnoses:  Fungal dermatitis     Discharge Instructions      Please apply the powder under your breast and in the groin area twice a day Please take medications as directed If you have worsening symptoms please return to urgent care to be reevaluated.   ED Prescriptions     Medication Sig Dispense Auth.  Provider   Miconazole Nitrate 2 % AERP Apply powder to the affected area twice daily. 113 g Merrilee Jansky, MD   terbinafine (LAMISIL) 250 MG tablet Take 1 tablet (250 mg total) by mouth daily for 7 days. 7 tablet Jakyria Bleau, Britta Mccreedy, MD      PDMP not reviewed this encounter.   Merrilee Jansky, MD 06/07/23 [id number]

## 2023-06-12 ENCOUNTER — Ambulatory Visit (INDEPENDENT_AMBULATORY_CARE_PROVIDER_SITE_OTHER): Payer: 59 | Admitting: Licensed Clinical Social Worker

## 2023-06-12 ENCOUNTER — Encounter: Payer: 59 | Admitting: Podiatry

## 2023-06-12 DIAGNOSIS — F411 Generalized anxiety disorder: Secondary | ICD-10-CM

## 2023-06-12 DIAGNOSIS — F4310 Post-traumatic stress disorder, unspecified: Secondary | ICD-10-CM

## 2023-06-12 DIAGNOSIS — F331 Major depressive disorder, recurrent, moderate: Secondary | ICD-10-CM | POA: Diagnosis not present
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
## 2023-06-14 ENCOUNTER — Ambulatory Visit (INDEPENDENT_AMBULATORY_CARE_PROVIDER_SITE_OTHER): Payer: 59 | Admitting: Podiatry

## 2023-06-14 DIAGNOSIS — Z01818 Encounter for other preprocedural examination: Secondary | ICD-10-CM | POA: Diagnosis not present

## 2023-06-14 DIAGNOSIS — T85848A Pain due to other internal prosthetic devices, implants and grafts, initial encounter: Secondary | ICD-10-CM

## 2023-06-14 NOTE — Progress Notes (Signed)
Subjective:  Patient ID: Meghan [name], female    DOB: Aug 20, 1966,  MRN: [id number]  Chief Complaint  Patient presents with   Routine Post Op    DOS: 02/25/2023 Procedure: Left total MPJ fusion  57 y.o. female returns for post-op check.  She states she is doing well on the left side.  The right hardware is still bothering her.  She is here to go over her CT scan results  Review of Systems: Negative except as noted in the HPI. Denies N/V/F/Ch.  Past Medical History:  Diagnosis Date   (HFpEF) heart failure with preserved ejection fraction (HCC) 01/2019   a.) Dx'd in [place], [place]; b.) TTE 08/14/2022: EF 60-65%, mod LVH, mild LAE, triv MR   Anxiety    Asthma    Atrial fibrillation and flutter (HCC) 02/16/2019   a.) CHA2DS2VASc = 6 (sex, HFpEF, HTN, CVA x2, T2DM);  b.) s/p DCCV (200 J x 1) 08/22/2022; c.) s/p RF ablation (CTI) 11/02/2022; d.) rate/rhythm maintained on oral flecainide + metoprolol succinate; chronically anticoagulated with rivaroxaban   Class 3 severe obesity due to excess calories with body mass index (BMI) of 50.0 to 59.9 in adult [facility])    DDD (degenerative disc disease), lumbar    GERD (gastroesophageal reflux disease)    Hiatal hernia    History of head injury    HTN (hypertension)    Hypercholesterolemia    Ileus (HCC)    Intractable nausea and vomiting 11/16/2020   Long term current use of anticoagulant    a.) rivaroxaban   Macular degeneration of left eye    MDD (major depressive disorder)    OA (osteoarthritis)    OSA on CPAP    Pneumonia    Presence of IVC filter    PTSD (post-traumatic stress disorder)    Recurrent pulmonary emboli (HCC)    a.) s/p IVC filter placement 2003; b.) previously followed by hematology when living in [place], [place]   Seizure disorder [facility])    a.) last was in 2020 per pt report   Stroke (HCC)    1989 and 1995 (left sided weakness)   T2DM (type 2 diabetes mellitus) (HCC)    Vitamin D deficiency     Current Outpatient  Medications:    acetaminophen (TYLENOL) 500 MG tablet, Take 1,000 mg by mouth every 6 (six) hours as needed for moderate pain., Disp: , Rfl:    albuterol (VENTOLIN HFA) 108 (90 Base) MCG/ACT inhaler, Inhale 2 puffs into the lungs every 6 (six) hours as needed for wheezing or shortness of breath., Disp: , Rfl:    atorvastatin (LIPITOR) 40 MG tablet, Take 40 mg by mouth every evening., Disp: , Rfl:    bumetanide (BUMEX) 1 MG tablet, Take 1 tablet (1 mg total) by mouth daily for 10 days. (Patient taking differently: Take 1 mg by mouth every morning.), Disp: 10 tablet, Rfl: 0   Cholecalciferol (VITAMIN D-3) 125 MCG (5000 UT) TABS, Take 5,000 Units by mouth daily., Disp: , Rfl:    empagliflozin (JARDIANCE) 10 MG TABS tablet, Take 1 tablet (10 mg total) by mouth daily., Disp: 90 tablet, Rfl: 3   flecainide (TAMBOCOR) 50 MG tablet, Take 1 tablet (50 mg total) by mouth 2 (two) times daily., Disp: 180 tablet, Rfl: 3   levETIRAcetam (KEPPRA) 500 MG tablet, Take 1 tablet (500 mg total) by mouth 2 (two) times daily., Disp: 60 tablet, Rfl: 0   metFORMIN (GLUCOPHAGE) 500 MG tablet, Take 500 mg by mouth daily with breakfast.,  Disp: , Rfl:    metoprolol succinate (TOPROL-XL) 25 MG 24 hr tablet, Take 1 tablet (25 mg total) by mouth daily. Take with or immediately following a meal., Disp: 90 tablet, Rfl: 3   Miconazole Nitrate 2 % AERP, Apply powder to the affected area twice daily., Disp: 113 g, Rfl: 0   Omega-3 Fatty Acids (FISH OIL) 1200 MG CAPS, Take 1,200 mg by mouth daily with breakfast., Disp: , Rfl:    omeprazole (PRILOSEC) 40 MG capsule, Take 40 mg by mouth 2 (two) times daily., Disp: , Rfl:    oxyCODONE-acetaminophen (PERCOCET) 5-325 MG tablet, Take 1 tablet by mouth every 4 (four) hours as needed for severe pain., Disp: 30 tablet, Rfl: 0   rivaroxaban (XARELTO) 20 MG TABS tablet, Take 20 mg by mouth at bedtime. , Disp: , Rfl:    RYBELSUS 14 MG TABS, Take 14 mg by mouth in the morning., Disp: , Rfl:     sertraline (ZOLOFT) 50 MG tablet, Take 50 mg by mouth in the morning., Disp: , Rfl:   Social History   Tobacco Use  Smoking Status Never  Smokeless Tobacco Never    Allergies  Allergen Reactions   Acetaminophen-Codeine Swelling, Rash and Other (See Comments)    Tylenol with Codeine, Tylenol #3,  (facial swelling, hives)   Bee Venom Anaphylaxis   Meloxicam Anaphylaxis and Rash   Tramadol Hives and Other (See Comments)    Patient stated "I was trippin' and I do not want that ever again"   Codeine Swelling    hives   Coconut (Cocos Nucifera) Rash and Other (See Comments)    ANY coconut products    Tomato Rash    Fresh    Objective:  There were no vitals filed for this visit. There is no height or weight on file to calculate BMI. Constitutional Well developed. Well nourished.  Vascular Foot warm and well perfused. Capillary refill normal to all digits.   Neurologic Normal speech. Oriented to person, place, and time. Epicritic sensation to light touch grossly present bilaterally.  Dermatologic Skin completely epithelialized.  No signs of Deis is noted no complication noted.  Good correction alignment noted of the toe.  Rigid first MPJ joint noted  Pain on palpation of right orthopedic hardware.  Rigid right first MPJ joint noted.  Orthopedic: No further tenderness to palpation noted about the surgical site.   Radiographs: 3 views of skeletally mature left foot: Hardware is intact no signs of backing or loosening noted.  Reduction of deformity noted Assessment:   1. Pain from implanted hardware, initial encounter   2. Encounter for preoperative examination for general surgical procedure      Plan:  Patient was evaluated and treated and all questions answered.  S/p foot surgery left -Clinically healed and doing really well.  Patient has healed surgical site.  Right painful orthopedic hardware -I explained to the patient the etiology of painful hardware and worse  treatment options were discussed.  -CT scan was reviewed which shows consolidation/fusion across the first metatarsal phalangeal joint.  At this time I discussed with the patient that she would benefit from surgical removal of hardware as it was causing a lot of her pain.  I discussed with the patient in extensive detail she states understanding.  I discussed my preoperative intra postop plan with the patient extensive detail she states understanding like to proceed with surgery -Informed surgical risk consent was reviewed and read aloud to the patient.  I reviewed the  films.  I have discussed my findings with the patient in great detail.  I have discussed all risks including but not limited to infection, stiffness, scarring, limp, disability, deformity, damage to blood vessels and nerves, numbness, poor healing, need for braces, arthritis, chronic pain, amputation, death.  All benefits and realistic expectations discussed in great detail.  I have made no promises as to the outcome.  I have provided realistic expectations.  I have offered the patient a 2nd opinion, which they have declined and assured me they preferred to proceed despite the risks  No follow-ups on file.

## 2023-06-26 ENCOUNTER — Ambulatory Visit (INDEPENDENT_AMBULATORY_CARE_PROVIDER_SITE_OTHER): Payer: 59 | Admitting: Licensed Clinical Social Worker

## 2023-06-26 DIAGNOSIS — F411 Generalized anxiety disorder: Secondary | ICD-10-CM

## 2023-06-26 DIAGNOSIS — F4310 Post-traumatic stress disorder, unspecified: Secondary | ICD-10-CM | POA: Diagnosis not present

## 2023-06-26 DIAGNOSIS — F331 Major depressive disorder, recurrent, moderate: Secondary | ICD-10-CM | POA: Diagnosis not present

## 2023-06-26 NOTE — Progress Notes (Signed)
Virtual Visit via Video Note   I connected with Siomara S. Muffley on 06/26/23 at 2:00pm by video enabled telemedicine application and verified that I am speaking with the correct person using two identifiers.   I discussed the limitations, risks, security and privacy concerns of performing an evaluation and management service by video and the availability of in person appointments. I also discussed with the patient that there may be a patient responsible charge related to this service. The patient expressed understanding and agreed to proceed.   I discussed the assessment and treatment plan with the patient. The patient was provided an opportunity to ask questions and all were answered. The patient agreed with the plan and demonstrated an understanding of the instructions.   The patient was advised to call back or seek an in-person evaluation if the symptoms worsen or if the condition fails to improve as anticipated.   I provided 37 minutes of non-face-to-face time during this encounter.   Noralee Stain, LCSW, LCAS _______________________ THERAPIST PROGRESS NOTE   Session Time:  2:00pm - 2:37pm   Location: Patient: Patient Home Provider: [facility]   Participation Level: Active    Behavioral Response: Alert, casually dressed, euthymic mood/affect   Type of Therapy:  Individual Therapy   Treatment Goals addressed: Mood management; Medication management; Managing healthy boundaries with family    Progress Towards Goals: Progressing   Interventions: CBT, problem solving, supportive therapy     Summary: Cathryne S. Bencomo is a 57 year old divorced African American female that presented for therapy appointment today with diagnoses of Major Depressive Disorder, recurrent, moderate, Generalized Anxiety Disorder and PTSD.      Suicidal/Homicidal: None; without plan or intent                                                                                                                Therapist  Response:  Clinician met with Lucienne for virtual therapy appointment and assessed for safety, sobriety, and medication compliance.  Christyn presented for session on time and was alert, oriented x5, with no evidence or self-report of active SI/HI or A/V H.  Geralene reported ongoing compliance with medication and denied any use of alcohol or illicit substances.  Clinician inquired about Sneha's current emotional ratings, as well as any significant changes in thoughts, feelings, or behavior since previous check-in.  Aleira reported scores of 0/10 for depression, 0/10 for anxiety, and 0/10 for anger/irritability.  Emileigh denied any recent panic attacks or outbursts.  Malaiah reported that a recent struggle has been dealing with "A rough couple of weeks".  Clinician inquired about what stressors have been present, and how Imaya has attempted to cope.  Tessie reported that she travelled out of the state to [place] to visit family, and was concerned about one of her granddaughters, who has been acting out, and family has not known how to address this behavior.  Clinician utilized a problem solving approach in order to assist Trinidee with this concern, and explored options for helping supports that  may have a mental health issue.  Tips were provided, including choosing a time to privately discuss concerns without distraction or interruption, using "I" statements to express one's feelings clearly, showing respect for differing points of view, validating their feelings, and encouraging them to take advantage of any resources that could be beneficial during the progression of their condition, such as keeping appointments with connected medical professionals, remaining compliant with any related medications, or engaging in online forums for other people afflicted with similar illness for additional support and guidance.  Clinician also encouraged Cylie to be mindful of enforcing healthy boundaries as she provides assistance to family to ensure  that she doesn't overwhelm herself with too much secondary stress.  Interventions were effective, as evidenced by Luster Landsberg actively engaging in discussion on the subject and reporting that this helped her realize that she approached the situation in the correct way, as she asked to speak separately, and the girl gradually opened up about her own stressors.  Shenekia reported that she clarified to the girl that she is not a therapist and cannot provide that service, but she can offer resources that might make a difference.  [name] reported that she shared these with the parents, and encouraged the girl to continue attending doctor's appointments since medication make be warranted.  Clinician will continue to monitor.           Plan: Follow up again in 1 week.    Diagnosis: Major depressive disorder, recurrent, moderate; Generalized Anxiety Disorder; and PTSD   Collaboration of Care:   No collaboration of care required for this visit.                                                   Patient/Guardian was advised Release of Information must be obtained prior to any record release in order to collaborate their care with an outside provider. Patient/Guardian was advised if they have not already done so to contact the registration department to sign all necessary forms in order for [name] to release information regarding their care.    Consent: Patient/Guardian gives verbal consent for treatment and assignment of benefits for services provided during this visit. Patient/Guardian expressed understanding and agreed to proceed.  Noralee Stain, LCSW, LCAS 06/26/23

## 2023-07-01 ENCOUNTER — Telehealth: Payer: Self-pay | Admitting: Podiatry

## 2023-07-01 NOTE — Telephone Encounter (Signed)
DOS- 07/22/2023  REMOVAL FIXATION DEEP RT- 20680  [facility] EFFECTIVE DATE- 09/17/2022  DEDUCTIBLE- $240.00 WITH REMAINING $0.00 OOP-$8850.00 WITH REMAINING  $747.93 COINSURANCE- 20%  PER THE [employer] WEBSITE PORTAL, PRIOR AUTHORIZATION IS NOT REQUIRED FOR CPT CODE 10626.  AUTH Decision ID #: [id number]

## 2023-07-02 ENCOUNTER — Encounter (HOSPITAL_COMMUNITY): Payer: Self-pay

## 2023-07-02 ENCOUNTER — Ambulatory Visit (HOSPITAL_COMMUNITY): Payer: 59 | Admitting: Licensed Clinical Social Worker

## 2023-07-10 ENCOUNTER — Ambulatory Visit (INDEPENDENT_AMBULATORY_CARE_PROVIDER_SITE_OTHER): Payer: 59 | Admitting: Licensed Clinical Social Worker

## 2023-07-10 DIAGNOSIS — F411 Generalized anxiety disorder: Secondary | ICD-10-CM | POA: Diagnosis not present

## 2023-07-10 DIAGNOSIS — F4310 Post-traumatic stress disorder, unspecified: Secondary | ICD-10-CM

## 2023-07-10 DIAGNOSIS — F331 Major depressive disorder, recurrent, moderate: Secondary | ICD-10-CM

## 2023-07-10 NOTE — Progress Notes (Signed)
Virtual Visit via Video Note   I connected with Meghan [name] on 07/10/23 at 3:00pm by video enabled telemedicine application and verified that I am speaking with the correct person using two identifiers.   I discussed the limitations, risks, security and privacy concerns of performing an evaluation and management service by video and the availability of in person appointments. I also discussed with the patient that there may be a patient responsible charge related to this service. The patient expressed understanding and agreed to proceed.   I discussed the assessment and treatment plan with the patient. The patient was provided an opportunity to ask questions and all were answered. The patient agreed with the plan and demonstrated an understanding of the instructions.   The patient was advised to call back or seek an in-person evaluation if the symptoms worsen or if the condition fails to improve as anticipated.   I provided 50 minutes of non-face-to-face time during this encounter.   Meghan Stain, LCSW, LCAS _______________________ THERAPIST PROGRESS NOTE   Session Time:  3:00pm - 3:50pm    Location: Patient: Patient Home Provider: [facility]   Participation Level: Active    Behavioral Response: Alert, casually dressed, euthymic mood/affect   Type of Therapy:  Individual Therapy   Treatment Goals addressed: Mood management; Medication management; Working part-time at [employer] Towards Goals: Progressing   Interventions: CBT, psychoeducation on work life balance    Summary: Meghan [name] is a 57 year old divorced African American female that presented for therapy appointment today with diagnoses of Major Depressive Disorder, recurrent, moderate, Generalized Anxiety Disorder and PTSD.      Suicidal/Homicidal: None; without plan or intent                                                                                                                Therapist Response:   Clinician met with Meghan [name] for virtual therapy session and assessed for safety, sobriety, and medication compliance.  Meghan [name] presented for appointment on time and was alert, oriented x5, with no evidence or self-report of active SI/HI or A/V H.  Meghan [name] reported ongoing compliance with medication and denied any use of alcohol or illicit substances.  Clinician inquired about Meghan [name]'s emotional ratings today, as well as any significant changes in thoughts, feelings, or behavior since last check-in.  Meghan [name] reported scores of 0/10 for depression, 0/10 for anxiety, and 0/10 for anger/irritability.  Meghan [name] denied any recent panic attacks or outbursts.  Meghan [name] reported that a recent success was travelling to [place] to visit family last week, although she ended up staying longer than expected when transportation broke down.  Meghan [name] reported that a struggle has been dealing with her work schedule, stating "My job is a Building control surveyor.  Its tiring".  Clinician engaged Meghan [name] in discussion on managing work/life balance today to improve mental health and wellness.  Clinician explained how finding balance between responsibilities at home and work place can be challenging, and lead to increased stress.  Clinician facilitated discussion on what  challenges Meghan [name] is currently, or has historically faced.  Clinician also discussed strategies for improving work/life balance while she works on her mental health during treatment.  Some of these included keeping track of time management; creating a list of priorities and scaling importance; setting realistic, measurable goals each day; establishing boundaries; taking care of health needs; and nurturing relationships at home and work for support.  Clinician inquired about areas where Meghan [name] feels she is excelling in, as well as areas she could focus on during treatment. 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  Meghan [name] reported that she experienced several symptoms of burnout, including headaches, decreased sleep, lack of motivation, and feeling overwhelmed.  Meghan [name] reported that there have also been a few warning signs such as spending an inordinate amount of time thinking or worrying about work and regularly feeling physically and emotionally drained during and after work.  Meghan [name] was receptive to suggestions offered today for ensuring healthy work life imbalance, including learning to say "No" to unreasonable demands from work, ensuring adequate time for self-care outside of work schedule, taking breaks when necessary, and prioritizing family time.  Meghan [name] stated "Last year it got toxic and I was ready to quit.  I'm not going to let it get to that point again". Clinician will continue to monitor.           Plan: Follow up again in 1 week.    Diagnosis: Major depressive disorder, recurrent, moderate; Generalized Anxiety Disorder; and PTSD   Collaboration of Care:   No collaboration of care required for this visit.                                                   Patient/Guardian was advised Release of Information must be obtained prior to any record release in order to collaborate their care with an outside provider. Patient/Guardian was advised if they have not already done so to contact the registration department to sign all necessary forms in order for [name] to release information regarding their care.    Consent: Patient/Guardian gives verbal consent for treatment and assignment of benefits for services provided during this visit. Patient/Guardian expressed understanding and agreed to proceed.  Meghan [name], [place], LCAS 07/10/23

## 2023-07-17 ENCOUNTER — Ambulatory Visit (INDEPENDENT_AMBULATORY_CARE_PROVIDER_SITE_OTHER): Payer: 59 | Admitting: Licensed Clinical Social Worker

## 2023-07-17 DIAGNOSIS — F331 Major depressive disorder, recurrent, moderate: Secondary | ICD-10-CM

## 2023-07-17 DIAGNOSIS — F411 Generalized anxiety disorder: Secondary | ICD-10-CM | POA: Diagnosis not present

## 2023-07-17 DIAGNOSIS — F4310 Post-traumatic stress disorder, unspecified: Secondary | ICD-10-CM

## 2023-07-17 NOTE — Progress Notes (Signed)
Virtual Visit via Video Note   I connected with Meghan [name] on 07/17/23 at 3:00pm by video enabled telemedicine application and verified that I am speaking with the correct person using two identifiers.   I discussed the limitations, risks, security and privacy concerns of performing an evaluation and management service by video and the availability of in person appointments. I also discussed with the patient that there may be a patient responsible charge related to this service. The patient expressed understanding and agreed to proceed.   I discussed the assessment and treatment plan with the patient. The patient was provided an opportunity to ask questions and all were answered. The patient agreed with the plan and demonstrated an understanding of the instructions.   The patient was advised to call back or seek an in-person evaluation if the symptoms worsen or if the condition fails to improve as anticipated.   I provided 36 minutes of non-face-to-face time during this encounter.   Noralee Stain, LCSW, LCAS _______________________ THERAPIST PROGRESS NOTE   Session Time:  3:00pm - 3:36pm   Location: Patient: Patient Home Provider: Home Office   Participation Level: Active    Behavioral Response: Alert, casually dressed, euthymic mood/affect   Type of Therapy:  Individual Therapy   Treatment Goals addressed: Mood management; Medication management; Working part-time at [employer] Towards Goals: Progressing   Interventions: CBT, conflict resolution skills     Summary: Meghan [name] is a 57 year old divorced African American female that presented for therapy appointment today with diagnoses of Major Depressive Disorder, recurrent, moderate, Generalized Anxiety Disorder and PTSD.      Suicidal/Homicidal: None; without plan or intent                                                                                                                Therapist Response:  Clinician met  with Meghan [name] for virtual therapy appointment and assessed for safety, sobriety, and medication compliance.  Meghan [name] presented for session on time and was alert, oriented x5, with no evidence or self-report of active SI/HI or A/V H.  Meghan [name] reported ongoing compliance with medication and denied any use of alcohol or illicit substances.  Clinician inquired about Meghan [name]'s current emotional ratings, as well as any significant changes in thoughts, feelings, or behavior since previous check-in.  Meghan [name] reported scores of 0/10 for depression, 0/10 for anxiety, and 0/10 for anger/irritability.  Meghan [name] denied any recent panic attacks or outbursts.  Meghan [name] reported that a recent success was providing support to a friend that lost their grandmother this week, stating "She was like family for me too".  Meghan [name] reported that a struggle was dealing with a coworker at her job Monday that was being disrespectful toward their supervisor.  She stated "He kept getting louder and louder and I thought 'this isn't good'".  Clinician utilized a Cabin crew with Meghan [name] today on topic of conflict resolution skills to assist her.  This handout provided 'do's and don'ts on how to deal with difficult people in  the workplace, since they have a tendency to sabotage conflict resolution efforts and greatly influence one's mood/wellbeing for the worse.  Several personality types were discussed, including aggressive types, know-it-alls, victims, sarcastic types, nay/yay sayers, and withdrawn.  Clinician inquired about which personalities that Kamra has struggled with, her typical strategy for dealing with them, and changes that could be made for improvement.  Clinician also encouraged Meghan [name] to utilize previously discussed anger management approaches when having to face these individuals, such as practicing deep breathing, meditation, and time outs when necessary if an outburst may be likely.  Intervention was effective, as evidenced by Meghan [name] participating  in discussion on topic and expressing receptiveness to material covered.  Meghan [name] reported that her initial reaction to the aggression this person showed was to react and match his level of energy, but she was able to manage this impulse and consider the negative consequences that could result.  Meghan [name] reported that she asked to speak with him separately afterward, away from staff, and advocated for her boss in an assertive manner, which the other employee was receptive to.  Meghan [name] stated "I have a new attitude.  The old me would have jumped up and cussed him out.  It would have made things worse".  Clinician will continue to monitor.           Plan: Follow up again in 1 week.    Diagnosis: Major depressive disorder, recurrent, moderate; Generalized Anxiety Disorder; and PTSD   Collaboration of Care:   No collaboration of care required for this visit.                                                   Patient/Guardian was advised Release of Information must be obtained prior to any record release in order to collaborate their care with an outside provider. Patient/Guardian was advised if they have not already done so to contact the registration department to sign all necessary forms in order for [name] to release information regarding their care.    Consent: Patient/Guardian gives verbal consent for treatment and assignment of benefits for services provided during this visit. Patient/Guardian expressed understanding and agreed to proceed.  Noralee Stain, LCSW, LCAS 07/17/23

## 2023-07-22 ENCOUNTER — Other Ambulatory Visit: Payer: Self-pay | Admitting: Podiatry

## 2023-07-22 DIAGNOSIS — Z4889 Encounter for other specified surgical aftercare: Secondary | ICD-10-CM | POA: Diagnosis not present

## 2023-07-22 MED ORDER — IBUPROFEN 800 MG PO TABS: 800.0000 mg | ORAL_TABLET | Freq: Four times a day (QID) | ORAL | 1 refills | Status: DC | PRN

## 2023-07-22 MED ORDER — OXYCODONE-ACETAMINOPHEN 5-325 MG PO TABS: 1.0000 | ORAL_TABLET | ORAL | 0 refills | Status: DC | PRN

## 2023-07-24 ENCOUNTER — Telehealth: Payer: Self-pay | Admitting: Podiatry

## 2023-07-24 NOTE — Telephone Encounter (Signed)
PT CALLED AND WANTS TO HAVE HER FIRST POST OP APPOINTMENT MOVED DUE TO HER WORK SCHEDULE. SHE STATED THAT THE DATE OF THE APPOINTMENT IS FINE, IT JUST HAS TO BE LATER IN THE DAY AFTER 1:30PM. THE BEST PHONE NUMBER TO REACH HER AT IS [phone]

## 2023-07-25 ENCOUNTER — Ambulatory Visit (HOSPITAL_COMMUNITY)
Admission: RE | Admit: 2023-07-25 | Discharge: 2023-07-25 | Disposition: A | Discharge status: Home / Self Care | Payer: 59 | Source: Ambulatory Visit | Attending: Family Medicine | Admitting: Family Medicine

## 2023-07-25 ENCOUNTER — Encounter (HOSPITAL_COMMUNITY): Payer: Self-pay

## 2023-07-25 VITALS — BP 107/69 | HR 70 | Temp 98.3°F | Resp 18 | Ht 65.0 in | Wt 304.0 lb

## 2023-07-25 DIAGNOSIS — M25552 Pain in left hip: Secondary | ICD-10-CM | POA: Diagnosis not present

## 2023-07-25 DIAGNOSIS — M25551 Pain in right hip: Secondary | ICD-10-CM

## 2023-07-25 NOTE — ED Triage Notes (Signed)
"  I'm experiencing pain in my left calf and back. I'm also experiencing pain in my left shoulder - Entered by patient"   Leg pain; HA and shoulder pain x 3 days. Pain starts in the left low back and down into the leg. Now having pain in the shoulder and head as well. No known injuries of history of pain. No urinary symptoms.   Pain better with laying down, worse with activity.

## 2023-07-25 NOTE — ED Provider Notes (Signed)
[facility]    CSN: [id number] Arrival date & time: 07/25/23  1105      History   Chief Complaint Chief Complaint  Patient presents with   Leg Pain   Appointment    HPI PENIEL BIEL is a 57 y.o. female.   Ms. Podgurski is a 57 year old female presenting with bilateral hip and lower back pain.  This started 2 weeks ago after doing water aerobics.  She noticed it felt good at first but then had some pain on both sides.  It intermittently affects 1 side of the other and is worse with ambulation.  It improves with rest.  She has not had any previous imaging of her lumbar spine and denies any radiation of the pain down the back of her leg with numbness or weakness.  She has not had any fever, chills, unexpected weight loss, or trauma to the area.  The history is provided by the patient.  Leg Pain Associated symptoms: back pain (non radiating)   Associated symptoms: no fever     Past Medical History:  Diagnosis Date   (HFpEF) heart failure with preserved ejection fraction (HCC) 01/2019   a.) Dx'd in [place], [place]; b.) TTE 08/14/2022: EF 60-65%, mod LVH, mild LAE, triv MR   Anxiety    Asthma    Atrial fibrillation and flutter (HCC) 02/16/2019   a.) CHA2DS2VASc = 6 (sex, HFpEF, HTN, CVA x2, T2DM);  b.) s/p DCCV (200 J x 1) 08/22/2022; c.) s/p RF ablation (CTI) 11/02/2022; d.) rate/rhythm maintained on oral flecainide + metoprolol succinate; chronically anticoagulated with rivaroxaban   Class 3 severe obesity due to excess calories with body mass index (BMI) of 50.0 to 59.9 in adult [facility])    DDD (degenerative disc disease), lumbar    GERD (gastroesophageal reflux disease)    Hiatal hernia    History of head injury    HTN (hypertension)    Hypercholesterolemia    Ileus (HCC)    Intractable nausea and vomiting 11/16/2020   Long term current use of anticoagulant    a.) rivaroxaban   Macular degeneration of left eye    MDD (major depressive disorder)    OA (osteoarthritis)     OSA on CPAP    Pneumonia    Presence of IVC filter    PTSD (post-traumatic stress disorder)    Recurrent pulmonary emboli (HCC)    a.) s/p IVC filter placement 2003; b.) previously followed by hematology when living in [place], [place]   Seizure disorder [facility])    a.) last was in 2020 per pt report   Stroke (HCC)    1989 and 1995 (left sided weakness)   T2DM (type 2 diabetes mellitus) (HCC)    Vitamin D deficiency     Patient Active Problem List   Diagnosis Date Noted   Vascular complication 11/07/2022   MDD (major depressive disorder), recurrent episode, moderate (HCC) 07/30/2022   GAD (generalized anxiety disorder) 07/30/2022   PTSD (post-traumatic stress disorder) 07/30/2022   Insomnia 05/24/2022   Arthritis of first metatarsophalangeal (MTP) joint of right foot 04/25/2022   Intractable nausea and vomiting 11/16/2020   Ileus (HCC) 11/14/2020   Fecal impaction (HCC) 11/14/2020   Left-sided weakness 09/29/2020   Asthma 09/29/2020   Pre-diabetes 09/29/2020   Pain in left finger(s) 07/12/2020   Seizure-like activity (HCC) 03/25/2020   History of head injury 03/25/2020   Obstructive sleep apnea syndrome 09/04/2019   Acute medial meniscus tear of right knee 07/10/2019  Class 3 severe obesity due to excess calories with serious comorbidity and body mass index (BMI) of 50.0 to 59.9 in adult [facility]) 06/01/2019   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   Atrial fibrillation (HCC) 04/17/2019   History of pulmonary embolus (PE) 04/17/2019    Past Surgical History:  Procedure Laterality Date   A-FLUTTER ABLATION N/A 11/02/2022   Procedure: A-FLUTTER ABLATION;  Surgeon: Maurice Small, MD;  [facility];  Service: Cardiovascular;  Laterality: N/A;   ARTHRODESIS METATARSALPHALANGEAL JOINT (MTPJ) Left 02/25/2023   Procedure: ARTHRODESIS METATARSALPHALANGEAL JOINT (MTPJ);  Surgeon: Candelaria Stagers, DPM;  Location: [facility];  Service: Podiatry;  Laterality: Left;   CARDIAC  CATHETERIZATION  2017   in [facility] in [place]   CARDIOVERSION N/A 08/22/2022   Procedure: CARDIOVERSION;  Surgeon: Thomasene Ripple, DO;  [facility]: [facility];  Service: Cardiovascular;  Laterality: N/A;   CESAREAN SECTION N/A 1989   CESAREAN SECTION N/A 1993   CHOLECYSTECTOMY  2003   COLONOSCOPY WITH PROPOFOL N/A 06/05/2019   Procedure: COLONOSCOPY WITH PROPOFOL;  Surgeon: Jeani Hawking, MD;  Location: [facility];  Service: Endoscopy;  Laterality: N/A;   CYST REMOVAL WITH BONE GRAFT Left 10/18/2020   Procedure: BONE GRAFTING OF ENCHONDROMA MIDDLE PHANLANX OF LEFT MIDDLE FINGER;  Surgeon: Cindee Salt, MD;  Location: [facility];  Service: Orthopedics;  Laterality: Left;  AXILLARY BLOCK   DIAGNOSTIC LAPAROSCOPY  2015; 2017   lap hernia repair x2   ENDOVENOUS ABLATION SAPHENOUS VEIN W/ LASER Left 08/31/2021   endovenous laser ablation left greater saphenous vein and stab phlebectomy 10-20 incisions left leg by Lemar Livings MD   HALLUX FUSION Right 05/28/2022   Procedure: HALLUX FUSION METATARSAL PHALANGEAL JOIT;  Surgeon: Candelaria Stagers, DPM;  Location: [facility];  Service: Podiatry;  Laterality: Right;  BLOCK   HERNIA REPAIR  2014, 2018   umbilical hernia repair   IVC FILTER INSERTION  2003   POLYPECTOMY  06/05/2019   Procedure: POLYPECTOMY;  Surgeon: Jeani Hawking, MD;  Location: [facility];  Service: Endoscopy;;   SHOULDER ARTHROSCOPY W/ ROTATOR CUFF REPAIR Right 08/28/2017   TOTAL ABDOMINAL HYSTERECTOMY  2003   UPPER GI ENDOSCOPY     WRIST ARTHROSCOPY WITH DEBRIDEMENT Left 10/18/2020   Procedure: LEFT WRIST ARTHROSCOPY WITH DEBRIDEMENT;  Surgeon: Cindee Salt, MD;  [facility];  Service: Orthopedics;  Laterality: Left;  AXILLARY BLOCK   WRIST ARTHROSCOPY WITH DEBRIDEMENT Right 01/25/2022   Procedure: ARTHROSCOPY RIGHT WRIST WITH  DEBRIDEMENT/ SHRINKAGE;  Surgeon: Betha Loa, MD;  [facility];  Service: Orthopedics;  Laterality: Right;    OB History    No obstetric history on file.      Home Medications    Prior to Admission medications   Medication Sig Start Date End Date Taking? Authorizing Provider  albuterol (VENTOLIN HFA) 108 (90 Base) MCG/ACT inhaler Inhale 2 puffs into the lungs every 6 (six) hours as needed for wheezing or shortness of breath.   Yes [provider]  atorvastatin (LIPITOR) 40 MG tablet Take 40 mg by mouth every evening. 07/26/21  Yes [provider]  bumetanide (BUMEX) 1 MG tablet Take 1 tablet (1 mg total) by mouth daily for 10 days. Patient taking differently: Take 1 mg by mouth every morning. 12/25/22 07/25/23 Yes White, Adrienne R, NP  Cholecalciferol (VITAMIN D-3) 125 MCG (5000 UT) TABS Take 5,000 Units by mouth daily.   Yes [provider]  metFORMIN (GLUCOPHAGE) 500 MG tablet Take 500 mg  by mouth daily with breakfast.   Yes [provider]  MOUNJARO 2.5 MG/0.5ML Pen Inject 2.5 mg into the skin once a week. 07/11/23  Yes [provider]  Omega-3 Fatty Acids (FISH OIL) 1200 MG CAPS Take 1,200 mg by mouth daily with breakfast.   Yes [provider]  omeprazole (PRILOSEC) 40 MG capsule Take 40 mg by mouth 2 (two) times daily. 08/17/20  Yes [provider]  oxyCODONE-acetaminophen (PERCOCET) 5-325 MG tablet Take 1-2 tablets by mouth every 4 (four) hours as needed for severe pain (pain score 7-10). 07/22/23  Yes Candelaria Stagers, DPM  rivaroxaban (XARELTO) 20 MG TABS tablet Take 20 mg by mouth at bedtime.    Yes [provider]  sertraline (ZOLOFT) 50 MG tablet Take 50 mg by mouth in the morning. 01/01/21  Yes [provider]  acetaminophen (TYLENOL) 500 MG tablet Take 1,000 mg by mouth every 6 (six) hours as needed for moderate pain.    [provider]  empagliflozin (JARDIANCE) 10 MG TABS tablet Take 1 tablet (10 mg total) by mouth daily. 04/24/23   Maisie Fus, MD  flecainide (TAMBOCOR) 50 MG tablet Take 1 tablet (50 mg total) by  mouth 2 (two) times daily. 04/24/23   Maisie Fus, MD  ibuprofen (ADVIL) 800 MG tablet Take 1 tablet (800 mg total) by mouth every 6 (six) hours as needed. 07/22/23   Candelaria Stagers, DPM  levETIRAcetam (KEPPRA) 500 MG tablet Take 1 tablet (500 mg total) by mouth 2 (two) times daily. 12/31/22 02/25/23  Terald Sleeper, MD  metoprolol succinate (TOPROL-XL) 25 MG 24 hr tablet Take 1 tablet (25 mg total) by mouth daily. Take with or immediately following a meal. 04/24/23   Maisie Fus, MD  Miconazole Nitrate 2 % AERP Apply powder to the affected area twice daily. 06/06/23   Lamptey, Britta Mccreedy, MD  oxyCODONE-acetaminophen (PERCOCET) 5-325 MG tablet Take 1 tablet by mouth every 4 (four) hours as needed for severe pain. 05/01/23   Candelaria Stagers, DPM    Family History Family History  Problem Relation Age of Onset   Breast cancer Mother    Heart attack Father    Diabetes Father    Congestive Heart Failure Father    Breast cancer Maternal Grandmother     Social History Social History   Tobacco Use   Smoking status: Never   Smokeless tobacco: Never  Vaping Use   Vaping status: Never Used  Substance Use Topics   Alcohol use: Never   Drug use: Never     Allergies   Acetaminophen-codeine, Bee venom, Meloxicam, Tramadol, Codeine, Coconut (cocos nucifera), and Tomato   Review of Systems Review of Systems  Constitutional:  Negative for activity change, chills, fever and unexpected weight change.  Musculoskeletal:  Positive for arthralgias (chronic of the hips and knees), back pain (non radiating), gait problem (due to pain and recent operation on her R foot) and myalgias (superior buttock bilaterally and lateral hips on the posterior aspect). Negative for joint swelling.  Skin:  Negative for color change, rash and wound.  Neurological:  Negative for weakness and numbness.     Physical Exam Triage Vital Signs ED Triage Vitals  Encounter Vitals Group     BP 07/25/23 1117 107/69      Systolic BP Percentile --      Diastolic BP Percentile --      Pulse Rate 07/25/23 1117 70     Resp 07/25/23  1117 18     Temp 07/25/23 1117 98.3 F (36.8 C)     Temp Source 07/25/23 1117 Oral     SpO2 07/25/23 1117 96 %     Weight 07/25/23 1116 (!) 304 lb 0.2 oz (137.9 kg)     Height 07/25/23 1116 5\' 5"  (1.651 m)     Head Circumference --      Peak Flow --      Pain Score 07/25/23 1114 10     Pain Loc --      Pain Education --      Exclude from Growth Chart --    No data found.  Updated Vital Signs BP 107/69 (BP Location: Left Arm)   Pulse 70   Temp 98.3 F (36.8 C) (Oral)   Resp 18   Ht 5\' 5"  (1.651 m)   Wt (!) 137.9 kg   SpO2 96%   BMI 50.59 kg/m   Visual Acuity Right Eye Distance:   Left Eye Distance:   Bilateral Distance:    Right Eye Near:   Left Eye Near:    Bilateral Near:     Physical Exam Vitals reviewed.  Constitutional:      General: She is not in acute distress.    Appearance: She is obese. She is not ill-appearing, toxic-appearing or diaphoretic.  Musculoskeletal:     Comments: No TTP over the lumbar or sacral spine or para spinal musculature TTP over the posterior aspect of the greater trochanters bilat  No overlying swelling or erythema Hip ROM limited due to body habitus and excess soft tissue but full extension lying on side Strength: flexion 5/5, extension 5/5, abduction 3/5 bilat Ober's negative  Skin:    General: Skin is warm and dry.  Neurological:     General: No focal deficit present.     Mental Status: She is alert.     Sensory: No sensory deficit.     Motor: No weakness.     Gait: Gait abnormal (antalgic wearing post op shoe on the R).     Deep Tendon Reflexes: Reflexes normal.  Psychiatric:        Mood and Affect: Mood normal.        Behavior: Behavior normal.        Thought Content: Thought content normal.        Judgment: Judgment normal.      UC Treatments / Results  Labs (all labs ordered are listed, but only  abnormal results are displayed) Labs Reviewed - No data to display  EKG   Radiology No results found.  Procedures Procedures (including critical care time)  Medications Ordered in UC Medications - No data to display  Initial Impression / Assessment and Plan / UC Course  I have reviewed the triage vital signs and the nursing notes.  Pertinent labs & imaging results that were available during my care of the patient were reviewed by me and considered in my medical decision making (see chart for details).     Greater trochanteric pain syndrome, bilaterally - We discussed the etiology of her pain.  This is likely due to the marked weakness of her hip abductors bilaterally. - She is not having any radicular pain in her lumbar discomfort is likely secondary to the weakness in her hips as well.  No indication for x-rays at this point - Patient states she cannot take NSAIDs but she can take Tylenol as needed for pain. - We discussed the use of ice massage  directly on the area of discomfort - Printed materials given with corresponding Internet based instruction videos for hip abductor exercises.  I suspect that with consistent strengthening of these muscles she will have some relief. - If she has worsening pain that is interfering with her ability to perform the exercises I recommend her following up with sports medicine clinic or orthopedic urgent care for steroid injection over the greater trochanteric bursa.  Unfortunately today we do not have spinal needles long enough for the procedure. - The patient voiced understanding and agreement with the plan.  Final Clinical Impressions(s) / UC Diagnoses   Final diagnoses:  Greater trochanteric pain syndrome of both lower extremities     Discharge Instructions      You have greater trochanteric pain syndrome. This is due to weakness of the hip stabilizing muscles.  In particular the abductor muscles are weak causing pain over the lateral  thigh Start of the hip abductor exercises daily. You can take over-the-counter anti-inflammatory medications for pain If your pain progresses and becomes intolerable you can follow-up with orthopedic urgent care for a steroid injection over the greater trochanteric bursa region. Start local icing and massage of the area as well With consistent strengthening of the correct muscle group you should see benefit within the next 6 weeks.  If not I recommend following up with your primary care provider or sports medicine clinic     ED Prescriptions   None    PDMP not reviewed this encounter.   Ivor Messier, MD 07/25/23 [phone]

## 2023-07-25 NOTE — Discharge Instructions (Signed)
You have greater trochanteric pain syndrome. This is due to weakness of the hip stabilizing muscles.  In particular the abductor muscles are weak causing pain over the lateral thigh Start of the hip abductor exercises daily. You can take over-the-counter anti-inflammatory medications for pain If your pain progresses and becomes intolerable you can follow-up with orthopedic urgent care for a steroid injection over the greater trochanteric bursa region. Start local icing and massage of the area as well With consistent strengthening of the correct muscle group you should see benefit within the next 6 weeks.  If not I recommend following up with your primary care provider or sports medicine clinic

## 2023-07-26 ENCOUNTER — Ambulatory Visit (INDEPENDENT_AMBULATORY_CARE_PROVIDER_SITE_OTHER): Payer: 59 | Admitting: Primary Care

## 2023-07-26 ENCOUNTER — Encounter: Payer: Self-pay | Admitting: Primary Care

## 2023-07-26 VITALS — BP 96/65 | HR 71 | Temp 98.6°F | Ht 65.0 in | Wt 317.2 lb

## 2023-07-26 DIAGNOSIS — G4709 Other insomnia: Secondary | ICD-10-CM | POA: Diagnosis not present

## 2023-07-26 DIAGNOSIS — J45909 Unspecified asthma, uncomplicated: Secondary | ICD-10-CM

## 2023-07-26 DIAGNOSIS — G4733 Obstructive sleep apnea (adult) (pediatric): Secondary | ICD-10-CM | POA: Diagnosis not present

## 2023-07-26 NOTE — Patient Instructions (Signed)
Change CPAP pressure from 13cm h20 to auto settings 11-16cm h20 Notify our office if these changes do not agree with you  If sleep remains fragmented despite pressure changes discuss trying different sleep aid with your psychiatrist   Follow-up 1 year with [facility] NP or sooner if needed

## 2023-07-26 NOTE — Progress Notes (Signed)
@Patient  ID: Meghan [name], female    DOB: 04-05-1966, 57 y.o.   MRN: [id number]  Chief Complaint  Patient presents with   Follow-up    Referring provider: Felix Pacini, *  HPI:  57 year old female, never smoked.  Past medical history significant for asthma, obstructive sleep apnea, A-fib, obesity.  Previous [facility] pulmonry encounter: 11/16/2021 Patient presents today for sleep consult. She is here to establish as a new patient. She has a history of sleep apnea, currently on CPAP. She wears CPAP every night. She is unsure setting. Over the last two months she has been consistently waking up at 2-3am and reports having trouble falling back asleep. She goes to bed around 9-10pm and starts her day between 7:30-8am. She has tried several different types of melatonin without improvement. She did not want to take diphenhydramine d.t kidneys. Her current resmed CPAP machine is working well. She wears size large full face mask. No large airleaks that she is aware of. She recently received new supplies. DME company is [employer].   Sleep questionnaire Symptoms- Snoring, restless sleep, daytime sleepiness      Prior sleep study- In 2020 ( we do not have copy) Bedtime- 9-10pm  Time to fall asleep- 15-20 mins Nocturnal awakenings- once around 2-3am  Out of bed/start of day- 7:30-8am  Weight changes- down 48lbs Do you operate heavy machinery- No Do you currently wear CPAP- Yes Do you current wear oxygen- Yes [facility]- 16   05/24/2022  Patient returns today for sleep follow-up.  She continues to have symptoms of restless sleep.  She wakes up on average every 90 minutes and has trouble falling back to sleep.  She has tried melatonin and ZzzQuil without improvement.  Typical bedtime is 10 PM.  She starts her day at 7:30 AM.  She reports compliance with CPAP however we only have a download from September 2022 to October 2022 she does not have an SD card in her machine.  Previous CPAP pressure was 13  cm H2O and she had a residual AHI of 1.7.  She had large amount of air leaks.  She tells me that she has received a new CPAP facemask.  Her DME company is [employer].  We received a copy of her sleep study results from May 16, 2019 that showed evidence of severe obstructive sleep apnea, overall AHI was 32.1 an hour with desaturations to 62%.  She had severe snoring.  Optimal CPAP pressure at that time was 9 cm H2O.  Obstructive sleep apnea syndrome - Sleep study on 05/16/2019 showed evidence of severe obstructive sleep apnea, overall AHI 32.1 an hour.  Patient is maintained on CPAP, previous pressure settings were 13 cm H2O.  She reports compliance with use however Airview download is not up-to-date and she does not have an SD card.  She continues to have interrupted fragmented sleep.  Recommend CPAP titration study in lab. FU in 4-6 weeks.    Insomnia -  She has difficulty maintaining sleep.  She will have a hard time falling back to sleep once she is awake.  She has tried melatonin and ZzzQuil without improvement.  Sending in Rx for Atarax 25 to 50 mg at bedtime for insomnia.     07/02/2022 Patient presents today for 4-6 week follow-up. She had a sleep study in August 2020 that showed severe OSA, overall AHI was 32.1 an hour with desaturations to 62%. Previous CPAP pressure 13cm h20. Experiencing large amt of air leaks. No compliance report available.  She reports continued disrupted/fragmented sleep. Started her on Atarax 25-50mg  at bedtime for insomnia. CPAP titration on 06/15/22 showed optimal pressure 11cm h20 with medium airfit N20 full face mask  She is currently taking pain medication for foot  Sleeping a little better with new mask. Atarax helped.  Everything else is ok  DME [employer] is where she gets her supplies from, received machine from [employer]   12/31/2022 Patient presents today for 6 month follow-up. Hx severe sleep apnea, sleep study in August 2020 overall AHI was 32.1 an hour with  desaturations to 62%. CPAP titration on 06/15/22 showed optimal pressure 11cm h20 with medium airfit N20 full face mask. She gets her medical supplies from [employer], received her machine from [facility]. She has a dream station CPAP machine. [employer] will be faxing compliance report.   She reports wearing CPAP every night except last night. She takes machine with her when she travel. She is using full face mask. No issues with mask fit or pressure settings. No significant airleaks. She is sleeping a lot better, moved apartments which has helped. She will occasionally take Atarax 25mg  but hasn't needed in the last month. Her psychiatrist changed you to doxepin. Her asthma symptoms are stable.  She is using Breztri twice daily as prescribed.  No recent need for albuterol.    07/26/2023- Interim Discussed the use of AI scribe software for clinical note transcription with the patient, who gave verbal consent to proceed.  History of Present Illness   Patient presents today for OSA follow-up. Maintained on CPAP therapy with a pressure setting of 13 cm H2O. She has also been treated for insomnia with Atarax and later Doxepin. However, recently, the patient has been experiencing significant sleep disturbances, waking up every two hours, and suffering from severe headaches upon awakening. She feels as if the CPAP machine is not functioning as it used to, suspecting that the pressure needs to be increased. Despite wearing the CPAP mask for approximately seven hours a night, the patient estimates she is only sleeping for a fraction of that time due to frequent awakenings. The patient has been diligent in maintaining her CPAP machine, regularly cleaning and changing the mask.   The patient's asthma symptoms are reportedly well-controlled with as-needed Albuterol, which is used approximately once a week, often in response to weather changes. The patient's weight has increased slightly due to a course of prednisone for an  asthma attack.      Airview download 04/27/2023 - 07/25/2023 Usage days 90/90 days (100%) greater than 4 hours Average usage 7 hours 26 minutes Pressure 13 cm H2O Air leaks 1.0 L/min (95%) AHI 1.8    Allergies  Allergen Reactions   Acetaminophen-Codeine Swelling, Rash and Other (See Comments)    Tylenol with Codeine, Tylenol #3,  (facial swelling, hives)   Bee Venom Anaphylaxis   Meloxicam Anaphylaxis and Rash   Tramadol Hives and Other (See Comments)    Patient stated "I was trippin' and I do not want that ever again"   Codeine Swelling    hives   Coconut (Cocos Nucifera) Rash and Other (See Comments)    ANY coconut products    Tomato Rash    Fresh     Immunization History  Administered Date(s) Administered   Influenza Split 05/22/2019, 07/18/2021   Influenza, High Dose Seasonal PF 05/22/2019   Influenza, Seasonal, Injecte, Preservative Fre 06/19/2023   Influenza-Unspecified 05/28/2019   PFIZER(Purple Top)SARS-COV-2 Vaccination 12/18/2019, 01/18/2020    Past Medical History:  Diagnosis  Date   (HFpEF) heart failure with preserved ejection fraction (HCC) 01/2019   a.) Dx'd in [place], [place]; b.) TTE 08/14/2022: EF 60-65%, mod LVH, mild LAE, triv MR   Anxiety    Asthma    Atrial fibrillation and flutter (HCC) 02/16/2019   a.) CHA2DS2VASc = 6 (sex, HFpEF, HTN, CVA x2, T2DM);  b.) s/p DCCV (200 J x 1) 08/22/2022; c.) s/p RF ablation (CTI) 11/02/2022; d.) rate/rhythm maintained on oral flecainide + metoprolol succinate; chronically anticoagulated with rivaroxaban   Class 3 severe obesity due to excess calories with body mass index (BMI) of 50.0 to 59.9 in adult [facility])    DDD (degenerative disc disease), lumbar    GERD (gastroesophageal reflux disease)    Hiatal hernia    History of head injury    HTN (hypertension)    Hypercholesterolemia    Ileus (HCC)    Intractable nausea and vomiting 11/16/2020   Long term current use of anticoagulant    a.) rivaroxaban   Macular  degeneration of left eye    MDD (major depressive disorder)    OA (osteoarthritis)    OSA on CPAP    Pneumonia    Presence of IVC filter    PTSD (post-traumatic stress disorder)    Recurrent pulmonary emboli (HCC)    a.) s/p IVC filter placement 2003; b.) previously followed by hematology when living in [place], [place]   Seizure disorder [facility])    a.) last was in 2020 per pt report   Stroke (HCC)    1989 and 1995 (left sided weakness)   T2DM (type 2 diabetes mellitus) (HCC)    Vitamin D deficiency     Tobacco History: Social History   Tobacco Use  Smoking Status Never  Smokeless Tobacco Never   Counseling given: Not Answered   Outpatient Medications Prior to Visit  Medication Sig Dispense Refill   albuterol (VENTOLIN HFA) 108 (90 Base) MCG/ACT inhaler Inhale 2 puffs into the lungs every 6 (six) hours as needed for wheezing or shortness of breath.     atorvastatin (LIPITOR) 40 MG tablet Take 40 mg by mouth every evening.     Cholecalciferol (VITAMIN D-3) 125 MCG (5000 UT) TABS Take 5,000 Units by mouth daily.     ibuprofen (ADVIL) 800 MG tablet Take 1 tablet (800 mg total) by mouth every 6 (six) hours as needed. 60 tablet 1   metFORMIN (GLUCOPHAGE) 500 MG tablet Take 500 mg by mouth daily with breakfast.     MOUNJARO 2.5 MG/0.5ML Pen Inject 2.5 mg into the skin once a week.     Omega-3 Fatty Acids (FISH OIL) 1200 MG CAPS Take 1,200 mg by mouth daily with breakfast.     omeprazole (PRILOSEC) 40 MG capsule Take 40 mg by mouth 2 (two) times daily.     oxyCODONE-acetaminophen (PERCOCET) 5-325 MG tablet Take 1-2 tablets by mouth every 4 (four) hours as needed for severe pain (pain score 7-10). 30 tablet 0   rivaroxaban (XARELTO) 20 MG TABS tablet Take 20 mg by mouth at bedtime.      sertraline (ZOLOFT) 50 MG tablet Take 50 mg by mouth in the morning.     acetaminophen (TYLENOL) 500 MG tablet Take 1,000 mg by mouth every 6 (six) hours as needed for moderate pain.     bumetanide  (BUMEX) 1 MG tablet Take 1 tablet (1 mg total) by mouth daily for 10 days. (Patient taking differently: Take 1 mg by mouth every morning.) 10 tablet 0  oxyCODONE-acetaminophen (PERCOCET) 5-325 MG tablet Take 1 tablet by mouth every 4 (four) hours as needed for severe pain. 30 tablet 0   empagliflozin (JARDIANCE) 10 MG TABS tablet Take 1 tablet (10 mg total) by mouth daily. 90 tablet 3   flecainide (TAMBOCOR) 50 MG tablet Take 1 tablet (50 mg total) by mouth 2 (two) times daily. 180 tablet 3   levETIRAcetam (KEPPRA) 500 MG tablet Take 1 tablet (500 mg total) by mouth 2 (two) times daily. 60 tablet 0   metoprolol succinate (TOPROL-XL) 25 MG 24 hr tablet Take 1 tablet (25 mg total) by mouth daily. Take with or immediately following a meal. 90 tablet 3   Miconazole Nitrate 2 % AERP Apply powder to the affected area twice daily. 113 g 0   No facility-administered medications prior to visit.      Review of Systems  Review of Systems  Neurological:  Positive for headaches.  Psychiatric/Behavioral:  Positive for sleep disturbance.      Physical Exam  BP 96/65 (BP Location: Right Arm, Patient Position: Sitting, Cuff Size: Large)   Pulse 71   Temp 98.6 F (37 C) (Oral)   Ht 5\' 5"  (1.651 m)   Wt (!) 317 lb 3.2 oz (143.9 kg)   SpO2 97%   BMI 52.78 kg/m  Physical Exam Constitutional:      General: She is not in acute distress.    Appearance: Normal appearance. She is obese. She is not ill-appearing.  HENT:     Head: Normocephalic and atraumatic.  Cardiovascular:     Rate and Rhythm: Normal rate and regular rhythm.  Pulmonary:     Effort: Pulmonary effort is normal.     Breath sounds: Normal breath sounds.  Neurological:     General: No focal deficit present.     Mental Status: She is alert and oriented to person, place, and time. Mental status is at baseline.  Psychiatric:        Mood and Affect: Mood normal.        Behavior: Behavior normal.        Thought Content: Thought  content normal.        Judgment: Judgment normal.      Lab Results:  CBC    Component Value Date/Time   WBC 6.6 12/31/2022 1340   RBC 4.09 12/31/2022 1340   HGB 11.0 (L) 12/31/2022 1340   HGB 11.2 10/26/2022 0949   HCT 35.2 (L) 12/31/2022 1340   HCT 34.9 10/26/2022 0949   PLT 245 12/31/2022 1340   PLT 267 10/26/2022 0949   MCV 86.1 12/31/2022 1340   MCV 84 10/26/2022 0949   MCH 26.9 12/31/2022 1340   MCHC 31.3 12/31/2022 1340   RDW 14.7 12/31/2022 1340   RDW 14.2 10/26/2022 0949   LYMPHSABS 1.6 11/07/2022 1717   LYMPHSABS 1.8 10/26/2022 0949   MONOABS 0.7 11/07/2022 1717   EOSABS 0.2 11/07/2022 1717   EOSABS 0.1 10/26/2022 0949   BASOSABS 0.0 11/07/2022 1717   BASOSABS 0.0 10/26/2022 0949    BMET    Component Value Date/Time   NA 139 02/19/2023 1322   NA 144 10/26/2022 0949   K 3.7 02/19/2023 1322   CL 103 02/19/2023 1322   CO2 27 02/19/2023 1322   GLUCOSE 91 02/19/2023 1322   BUN 15 02/19/2023 1322   BUN 15 10/26/2022 0949   CREATININE 0.78 02/19/2023 1322   CALCIUM 10.2 02/19/2023 1322   GFRNONAA >60 02/19/2023 1322   GFRAA >60 05/11/2020  1701    BNP    Component Value Date/Time   BNP 92.4 02/01/2021 1034    ProBNP No results found for: "PROBNP"  Imaging: No results found.   Assessment & Plan:   1. Obstructive sleep apnea syndrome - Ambulatory Referral for DME  2. Uncomplicated asthma, unspecified asthma severity, unspecified whether persistent  3. Other insomnia   Obstructive Sleep Apnea Severe sleep apnea with AHI of 32/hour on previous study. Currently on CPAP with pressure setting of 13 cm H2O. She is 100% compliant with usage >4 hours over the last 30 days. Reports waking up with headaches and feeling like the pressure is not enough. Recent weight gain of 5 pounds due to prednisone use for asthma exacerbation. -Change CPAP settings to auto with a range of 11 to 16 cm H2O. -Advise patient to contact medical supply store to ensure  pressure change  -Plan to replace CPAP machine in August 2025 as eligible for new machine.  Insomnia Currently on Doxepin, but reports it is not effective. Frequent awakenings during the night, difficulty falling asleep. -Advise patient to discuss with psychiatrist at next appointment for possible medication adjustment. -If no improvement, consider further intervention from sleep specialist.  Asthma Well controlled with Albuterol as needed. No longer on Breztri. -Continue Albuterol as needed.      FU 1 year or sooner if needed   Glenford Bayley, NP 07/26/2023

## 2023-07-30 ENCOUNTER — Ambulatory Visit (HOSPITAL_COMMUNITY): Payer: 59 | Admitting: Licensed Clinical Social Worker

## 2023-07-30 DIAGNOSIS — F331 Major depressive disorder, recurrent, moderate: Secondary | ICD-10-CM | POA: Diagnosis not present

## 2023-07-30 DIAGNOSIS — F411 Generalized anxiety disorder: Secondary | ICD-10-CM | POA: Diagnosis not present

## 2023-07-30 DIAGNOSIS — F4310 Post-traumatic stress disorder, unspecified: Secondary | ICD-10-CM | POA: Diagnosis not present

## 2023-07-30 NOTE — Progress Notes (Signed)
Virtual Visit via Video Note   I connected with Meghan [name] on 07/30/23 at 3:00pm by video enabled telemedicine application and verified that I am speaking with the correct person using two identifiers.   I discussed the limitations, risks, security and privacy concerns of performing an evaluation and management service by video and the availability of in person appointments. I also discussed with the patient that there may be a patient responsible charge related to this service. The patient expressed understanding and agreed to proceed.   I discussed the assessment and treatment plan with the patient. The patient was provided an opportunity to ask questions and all were answered. The patient agreed with the plan and demonstrated an understanding of the instructions.   The patient was advised to call back or seek an in-person evaluation if the symptoms worsen or if the condition fails to improve as anticipated.   I provided 46 minutes of non-face-to-face time during this encounter.   Noralee Stain, LCSW, LCAS _______________________ THERAPIST PROGRESS NOTE   Session Time:  3:00pm - 3:46pm  Location: Patient: Patient Home Provider: [facility]   Participation Level: Active    Behavioral Response: Alert, casually dressed, euthymic mood/affect   Type of Therapy:  Individual Therapy   Treatment Goals addressed: Mood management; Medication management; Psychiatry followup; Working part-time at [employer]; Safety planning   Progress Towards Goals: Progressing   Interventions: CBT, treatment planning    Summary: Meghan [name] is a 57 year old divorced African American female that presented for therapy appointment today with diagnoses of Major Depressive Disorder, recurrent, moderate, Generalized Anxiety Disorder and PTSD.      Suicidal/Homicidal: None; without plan or intent                                                                                                                 Therapist Response:  Clinician met with Meghan [name] for virtual therapy session and assessed for safety, sobriety, and medication compliance.  Meghan [name] presented for appointment on time and was alert, oriented x5, with no evidence or self-report of active SI/HI or A/V H.  Vannie reported ongoing compliance with medication and denied any use of alcohol or illicit substances.  Clinician inquired about Meghan [name]'s emotional ratings today, as well as any significant changes in thoughts, feelings, or behavior since last check-in.  Hargun reported scores of 0/10 for depression, 0/10 for anxiety, and 0/10 for anger/irritability.  Meghan [name] denied any recent panic attacks or outbursts.  Meghan [name] reported that a recent success was attending a surgery appointment which went well on Monday, stating "I was home the same day and everything went smoothly".  She reported that an ongoing struggle has been getting consistent sleep, noting that she is averaging only 2-3 hours at night, and napping during the day, although she went to a pulmonary doctor, who will make changes to her CPAP machine. Mava denied any recent issues to address in therapy today.  Clinician recommended revisiting treatment plan to identify progress towards goals and any present  barriers, which she was agreeable to.  Clinician collaborated with Meghan [name] to make updates as follows with her verbal consent: Meet with clinician every 1-2 weeks for therapy sessions to update on goal progress and address any needs that arise; Follow up with psychiatrist once every 3 months regarding efficacy of medication and any dose modification necessary; Take medications daily as prescribed to aid in symptom reduction and improvement of daily functioning; Maintain depression at low severity level (0-1/10) over next 90 days by attending regular therapy sessions, and engaging in healthy self-care activities daily to keep mind engaged such as reading, going for walks in the park, and speaking with family  x3 weekly for support; Maintain anxiety at low severity level (0-1/10) over next 90 days by practicing relaxation techniques with proven efficacy 2-3x daily, in addition to challenging anxious thoughts that arise to negate negative impact on outlook; Exercise for at least x3 per week, 45 minutes per water aerobics class in addition to following heart-healthy diet to improve both physical and mental well-being per PCP recommendations; Attend church meetings with clergy members 2-3x per week to stay productive, engaged with supportive community, and maintain spiritual support, in addition to any other available activities such as choir once per week/once per month; Maintain healthier boundaries with all supports to avoid worsening anxiety and depression, as well as enforce assertive communication skills, with goal of limiting contact with toxic supports until more respectful communication patterns are established; Maintain high self-esteem (8/10 or above) over next 3 months by continuing practice of daily positive affirmations, challenging negative core beliefs related to self-image, and asserting needs more readily when interpersonal issues arise; Continue to work 6 hours per week at [employer] 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  Progress is evidenced by Meghan [name] staying consistent with therapy, psychiatry appointments, taking medication as prescribed, noting reduction in average depression and anxiety levels, improving self-esteem, increasing weekly exercise through water aerobics classes, staying engaged with her church community, and maintaining regular schedule at her part-time job.  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  Meghan [name] reported that she feels good about overall progress being made in therapy, and remains motivated, stating "I just need to keep doing whatever it is I'm doing".  Clinician will continue to monitor.            Plan: Follow up again in 1-2 weeks.    Diagnosis: Major depressive disorder, recurrent, moderate; Generalized Anxiety Disorder; and PTSD   Collaboration of Care:   No collaboration of care required for this visit.                                                   Patient/Guardian was advised Release of Information must be obtained prior to any record release in order to collaborate their care with an outside provider. Patient/Guardian was advised if they have not already done so to contact the registration department to  sign all necessary forms in order for [name] to release information regarding their care.    Consent: Patient/Guardian gives verbal consent for treatment and assignment of benefits for services provided during this visit. Patient/Guardian expressed understanding and agreed to proceed.  Noralee Stain, [place], LCAS 07/30/23

## 2023-07-31 ENCOUNTER — Ambulatory Visit (INDEPENDENT_AMBULATORY_CARE_PROVIDER_SITE_OTHER): Payer: 59 | Admitting: Podiatry

## 2023-07-31 ENCOUNTER — Encounter: Payer: 59 | Admitting: Podiatry

## 2023-07-31 ENCOUNTER — Ambulatory Visit (INDEPENDENT_AMBULATORY_CARE_PROVIDER_SITE_OTHER): Payer: 59

## 2023-07-31 ENCOUNTER — Encounter: Payer: Self-pay | Admitting: Podiatry

## 2023-07-31 VITALS — Ht 65.0 in | Wt 317.2 lb

## 2023-07-31 DIAGNOSIS — T85848A Pain due to other internal prosthetic devices, implants and grafts, initial encounter: Secondary | ICD-10-CM

## 2023-07-31 DIAGNOSIS — Z9889 Other specified postprocedural states: Secondary | ICD-10-CM

## 2023-07-31 MED ORDER — OXYCODONE-ACETAMINOPHEN 5-325 MG PO TABS: 1.0000 | ORAL_TABLET | ORAL | 0 refills | Status: DC | PRN

## 2023-07-31 NOTE — Progress Notes (Signed)
Subjective:  Patient ID: Meghan [name], female    DOB: 11/08/1965,  MRN: [id number]  Chief Complaint  Patient presents with   Routine Post Op    Pt is here for post op visit due to removal of hardware in the right foot, pt states she is still in pain, not putting a lot of pressure on foot.    DOS: 07/22/2023 Procedure: Right painful hardware removal  57 y.o. female returns for post-op check.  She states she is doing good.  She has some pain denies any other acute complaints.  No nausea fever chills vomiting bandages clean dry and intact  Review of Systems: Negative except as noted in the HPI. Denies N/V/F/Ch.  Past Medical History:  Diagnosis Date   (HFpEF) heart failure with preserved ejection fraction (HCC) 01/2019   a.) Dx'd in [place]; b.) TTE 08/14/2022: EF 60-65%, mod LVH, mild LAE, triv MR   Anxiety    Asthma    Atrial fibrillation and flutter (HCC) 02/16/2019   a.) CHA2DS2VASc = 6 (sex, HFpEF, HTN, CVA x2, T2DM);  b.) s/p DCCV (200 J x 1) 08/22/2022; c.) s/p RF ablation (CTI) 11/02/2022; d.) rate/rhythm maintained on oral flecainide + metoprolol succinate; chronically anticoagulated with rivaroxaban   Class 3 severe obesity due to excess calories with body mass index (BMI) of 50.0 to 59.9 in adult [facility])    DDD (degenerative disc disease), lumbar    GERD (gastroesophageal reflux disease)    Hiatal hernia    History of head injury    HTN (hypertension)    Hypercholesterolemia    Ileus (HCC)    Intractable nausea and vomiting 11/16/2020   Long term current use of anticoagulant    a.) rivaroxaban   Macular degeneration of left eye    MDD (major depressive disorder)    OA (osteoarthritis)    OSA on CPAP    Pneumonia    Presence of IVC filter    PTSD (post-traumatic stress disorder)    Recurrent pulmonary emboli (HCC)    a.) s/p IVC filter placement 2003; b.) previously followed by hematology when living in [place], [place]   Seizure disorder [facility])    a.) last was  in 2020 per pt report   Stroke (HCC)    1989 and 1995 (left sided weakness)   T2DM (type 2 diabetes mellitus) (HCC)    Vitamin D deficiency     Current Outpatient Medications:    albuterol (VENTOLIN HFA) 108 (90 Base) MCG/ACT inhaler, Inhale 2 puffs into the lungs every 6 (six) hours as needed for wheezing or shortness of breath., Disp: , Rfl:    atorvastatin (LIPITOR) 40 MG tablet, Take 40 mg by mouth every evening., Disp: , Rfl:    Cholecalciferol (VITAMIN D-3) 125 MCG (5000 UT) TABS, Take 5,000 Units by mouth daily., Disp: , Rfl:    ibuprofen (ADVIL) 800 MG tablet, Take 1 tablet (800 mg total) by mouth every 6 (six) hours as needed., Disp: 60 tablet, Rfl: 1   metFORMIN (GLUCOPHAGE) 500 MG tablet, Take 500 mg by mouth daily with breakfast., Disp: , Rfl:    MOUNJARO 2.5 MG/0.5ML Pen, Inject 2.5 mg into the skin once a week., Disp: , Rfl:    Omega-3 Fatty Acids (FISH OIL) 1200 MG CAPS, Take 1,200 mg by mouth daily with breakfast., Disp: , Rfl:    omeprazole (PRILOSEC) 40 MG capsule, Take 40 mg by mouth 2 (two) times daily., Disp: , Rfl:    oxyCODONE-acetaminophen (PERCOCET) 5-325 MG  tablet, Take 1-2 tablets by mouth every 4 (four) hours as needed for severe pain (pain score 7-10)., Disp: 30 tablet, Rfl: 0   rivaroxaban (XARELTO) 20 MG TABS tablet, Take 20 mg by mouth at bedtime. , Disp: , Rfl:    sertraline (ZOLOFT) 50 MG tablet, Take 50 mg by mouth in the morning., Disp: , Rfl:    acetaminophen (TYLENOL) 500 MG tablet, Take 1,000 mg by mouth every 6 (six) hours as needed for moderate pain., Disp: , Rfl:    bumetanide (BUMEX) 1 MG tablet, Take 1 tablet (1 mg total) by mouth daily for 10 days. (Patient taking differently: Take 1 mg by mouth every morning.), Disp: 10 tablet, Rfl: 0   oxyCODONE-acetaminophen (PERCOCET) 5-325 MG tablet, Take 1 tablet by mouth every 4 (four) hours as needed for severe pain., Disp: 30 tablet, Rfl: 0  Social History   Tobacco Use  Smoking Status Never  Smokeless  Tobacco Never    Allergies  Allergen Reactions   Acetaminophen-Codeine Swelling, Rash and Other (See Comments)    Tylenol with Codeine, Tylenol #3,  (facial swelling, hives)   Bee Venom Anaphylaxis   Meloxicam Anaphylaxis and Rash   Tramadol Hives and Other (See Comments)    Patient stated "I was trippin' and I do not want that ever again"   Codeine Swelling    hives   Coconut (Cocos Nucifera) Rash and Other (See Comments)    ANY coconut products    Tomato Rash    Fresh    Objective:  There were no vitals filed for this visit. Body mass index is 52.78 kg/m. Constitutional Well developed. Well nourished.  Vascular Foot warm and well perfused. Capillary refill normal to all digits.   Neurologic Normal speech. Oriented to person, place, and time. Epicritic sensation to light touch grossly present bilaterally.  Dermatologic Skin healing well without signs of infection. Skin edges well coapted without signs of infection.  Orthopedic: Tenderness to palpation noted about the surgical site.   Radiographs: 3 views of skeletally mature right foot: No hardware noted.  Good correction alignment noted fusion of the first metatarsophalangeal joint noted Assessment:   1. Pain from implanted hardware, initial encounter   2. Status post surgery    Plan:  Patient was evaluated and treated and all questions answered.  S/p foot surgery right -Progressing as expected post-operatively. -XR: See above -WB Status: Weightbearing as tolerated in surgical shoe -Sutures: Intact No clinical signs of Deis is noted no complication noted.. -Medications: None -Foot redressed.  No follow-ups on file.

## 2023-07-31 NOTE — Addendum Note (Signed)
Addended by: Nicholes Rough on: 07/31/2023 02:28 PM   Modules accepted: Orders

## 2023-08-06 ENCOUNTER — Ambulatory Visit (INDEPENDENT_AMBULATORY_CARE_PROVIDER_SITE_OTHER): Payer: 59 | Admitting: Licensed Clinical Social Worker

## 2023-08-06 DIAGNOSIS — F331 Major depressive disorder, recurrent, moderate: Secondary | ICD-10-CM | POA: Diagnosis not present

## 2023-08-06 DIAGNOSIS — F4310 Post-traumatic stress disorder, unspecified: Secondary | ICD-10-CM | POA: Diagnosis not present

## 2023-08-06 DIAGNOSIS — F411 Generalized anxiety disorder: Secondary | ICD-10-CM

## 2023-08-06 NOTE — Progress Notes (Signed)
Virtual Visit via Video Note   I connected with Meghan [name] on 08/06/23 at 2:00pm by video enabled telemedicine application and verified that I am speaking with the correct person using two identifiers.   I discussed the limitations, risks, security and privacy concerns of performing an evaluation and management service by video and the availability of in person appointments. I also discussed with the patient that there may be a patient responsible charge related to this service. The patient expressed understanding and agreed to proceed.   I discussed the assessment and treatment plan with the patient. The patient was provided an opportunity to ask questions and all were answered. The patient agreed with the plan and demonstrated an understanding of the instructions.   The patient was advised to call back or seek an in-person evaluation if the symptoms worsen or if the condition fails to improve as anticipated.   I provided 1 hour of non-face-to-face time during this encounter.   Noralee Stain, LCSW, LCAS _______________________ THERAPIST PROGRESS NOTE   Session Time:  2:00pm - 3:00pm   Location: Patient: Patient Home Provider: Home Office   Participation Level: Active    Behavioral Response: Alert, casually dressed, angry/irritable mood/affect   Type of Therapy:  Individual Therapy   Treatment Goals addressed: Mood management; Medication management  Progress Towards Goals: Progressing   Interventions: CBT, psychoeducation on grief and loss   Summary: Meghan [name] is a 57 year old divorced African American female that presented for therapy appointment today with diagnoses of Major Depressive Disorder, recurrent, moderate, Generalized Anxiety Disorder and PTSD.      Suicidal/Homicidal: None; without plan or intent                                                                                                                Therapist Response:  Clinician met with Meghan [name] for virtual  therapy appointment and assessed for safety, sobriety, and medication compliance.  Meghan [name] presented for session on time and was alert, oriented x5, with no evidence or self-report of active SI/HI or A/V H.  [name] reported ongoing compliance with medication and denied any use of alcohol or illicit substances.  Clinician inquired about Meghan [name]'s current emotional ratings today, as well as any significant changes in thoughts, feelings, or behavior since previous check-in.  [name] reported scores of 8/10 for depression, 5/10 for anxiety, and 10/10 for anger/irritability.  [name] denied any recent panic attacks or outbursts.  [name] reported that a recent struggle has been dealing with distressing emotions, which are tied into grief, as this time of year tends to remind her of deceased friends and family.  [name] stated "I've been feeling blah all week".  Clinician expressed sympathy for [name] losses, and reviewed psychoeducation with her on the 5 stages of grief.  This included denial, anger, bargaining, depression, and acceptance.  Clinician discussed how each stage can affect an individual, and provided strategies on how to faciliate healthy grieving as she processes this loss. Strategies provided to [name] included taking time to  allow healthy emotional expression (I.e. allowing oneself to cry when appropriate), engaging in healthy self-care activities for distraction, talking to people who can relate to the loss for support, and considering ways to memorialize the deceased in a meaningful way.  Intervention was effective, as evidenced by Meghan [name] actively engaging in discussion on subject and expressing receptiveness to strategies offered for processing her grief and related emotions in a healthier way.  [name] reported that when her mother passed away, she started to compartmentalize her emotions, and stated "I felt like I had to be strong for everyone else.  That's how I always looked at grief".  [name] reported that she  will be going to see her family for the holidays and try to be more open and honest about her feelings to seek support and emotional release.  [name] stated "When somebody close to you passes away you need to cry".  Clinician also encouraged [name] to consider admission to grief counseling and/or support groups available through [employer] to further facilitate healing and expand support network.  [name] reported that she would consider it if this issue remains consistent.  Clinician will continue to monitor.            Plan: Follow up again in 1-2 weeks.    Diagnosis: Major depressive disorder, recurrent, moderate; Generalized Anxiety Disorder; and PTSD   Collaboration of Care:   No collaboration of care required for this visit.                                                   Patient/Guardian was advised Release of Information must be obtained prior to any record release in order to collaborate their care with an outside provider. Patient/Guardian was advised if they have not already done so to contact the registration department to sign all necessary forms in order for [name] to release information regarding their care.    Consent: Patient/Guardian gives verbal consent for treatment and assignment of benefits for services provided during this visit. Patient/Guardian expressed understanding and agreed to proceed.  Noralee Stain, [place], LCAS 08/06/23

## 2023-08-13 ENCOUNTER — Ambulatory Visit (HOSPITAL_COMMUNITY): Payer: 59 | Admitting: Licensed Clinical Social Worker

## 2023-08-13 DIAGNOSIS — F4310 Post-traumatic stress disorder, unspecified: Secondary | ICD-10-CM

## 2023-08-13 DIAGNOSIS — F411 Generalized anxiety disorder: Secondary | ICD-10-CM

## 2023-08-13 DIAGNOSIS — F331 Major depressive disorder, recurrent, moderate: Secondary | ICD-10-CM | POA: Diagnosis not present

## 2023-08-13 NOTE — Progress Notes (Signed)
Virtual Visit via Video Note   I connected with Meghan [name] on 08/13/23 at 1:00pm by video enabled telemedicine application and verified that I am speaking with the correct person using two identifiers.   I discussed the limitations, risks, security and privacy concerns of performing an evaluation and management service by video and the availability of in person appointments. I also discussed with the patient that there may be a patient responsible charge related to this service. The patient expressed understanding and agreed to proceed.   I discussed the assessment and treatment plan with the patient. The patient was provided an opportunity to ask questions and all were answered. The patient agreed with the plan and demonstrated an understanding of the instructions.   The patient was advised to call back or seek an in-person evaluation if the symptoms worsen or if the condition fails to improve as anticipated.   I provided 46 minutes of non-face-to-face time during this encounter.   Meghan Stain, LCSW, LCAS _______________________ THERAPIST PROGRESS NOTE   Session Time:  1:00pm - 1:46pm   Location: Patient: Patient Home Provider: [facility] Office     Participation Level: Active    Behavioral Response: Alert, casually dressed, anxious mood/affect    Type of Therapy:  Individual Therapy   Treatment Goals addressed: Mood management; Medication management; Managing healthy boundaries with support network   Progress Towards Goals: Progressing    Interventions: CBT, psychoeducation on healthy relationships    Summary: Meghan [name] is a 57 year old divorced African American female that presented for therapy appointment today with diagnoses of Major Depressive Disorder, recurrent, moderate, Generalized Anxiety Disorder and PTSD.      Suicidal/Homicidal: None; without plan or intent                                                                                                                 Therapist Response:  Clinician met with Meghan [name] for virtual therapy session and assessed for safety, sobriety, and medication compliance.  Meghan [name] presented for appointment on time and was alert, oriented x5, with no evidence or self-report of active SI/HI or A/V H.  Meghan [name] reported ongoing compliance with medication and denied any use of alcohol or illicit substances.  Clinician inquired about Meghan [name]'s emotional ratings today, as well as any significant changes in thoughts, feelings, or behavior since last check-in.  Meghan [name] reported scores of 0/10 for depression, 4/10 for anxiety, and 0/10 for anger/irritability.  Meghan [name] denied any recent panic attacks or outbursts.  Meghan [name] reported that a recent success was having an old friend outreach her on the phone and inform her that he would like a second chance at a relationship.  Meghan [name] reported that she is worried about this though, since he did not treat her well in the past.  Clinician provided psychoeducation on traits of healthy relationships using a handout to guide discussion with Meghan [name] on the subject.  This handout explained how attachments to connections within one's support network (i.e. friends, family, romantic partners, coworkers, Catering manager)  can influence one's mental health, and emphasized importance of looking for green lights (positive behaviors) that can be considered normal, as well as red lights (harmful behaviors) which suggest that a boundary should be established. Meghan [name] was tasked with identifying green lights (i.e. respect, trust, appreciation, healthy conflict resolution, etc) and red lights (i.e. contempt, suspicion, impatience, lack of growth, etc) that were present in previous relationship in order to help her determine how to manage boundaries appropriately with this friend, and decide whether they could be compatible in a dating capacity.  Intervention was effective, as evidenced by [name] actively participating in discussion on subject, and identifying  several unhealthy traits that her friend displayed early in the relationship over 2 years ago, such as poor communication, dishonesty, and unreliable.  Meghan [name] reported that in recent conversations since reconnecting, there have been positive signs that he has changed though, such as him being more respectful, calling her each day to check in, showing more signs that she can trust him, respect for her personal boundaries and improved conflict resolution skills.  Meghan [name] reported that she will plan to adjust her boundaries based upon how he acts when they hang out this weekend, stating "I need to take things slowly because my walls are still up and won't immediately come down".  Clinician will continue to monitor.         Plan: Follow up again in 1-2 weeks.    Diagnosis: Major depressive disorder, recurrent, moderate; Generalized Anxiety Disorder; and PTSD   Collaboration of Care:   No collaboration of care required for this visit.                                                   Patient/Guardian was advised Release of Information must be obtained prior to any record release in order to collaborate their care with an outside provider. Patient/Guardian was advised if they have not already done so to contact the registration department to sign all necessary forms in order for [name] to release information regarding their care.    Consent: Patient/Guardian gives verbal consent for treatment and assignment of benefits for services provided during this visit. Patient/Guardian expressed understanding and agreed to proceed.  Meghan [name], [place], LCAS 08/13/23

## 2023-08-14 ENCOUNTER — Encounter: Payer: Self-pay | Admitting: Podiatry

## 2023-08-14 ENCOUNTER — Ambulatory Visit (INDEPENDENT_AMBULATORY_CARE_PROVIDER_SITE_OTHER): Payer: 59 | Admitting: Podiatry

## 2023-08-14 DIAGNOSIS — T85848A Pain due to other internal prosthetic devices, implants and grafts, initial encounter: Secondary | ICD-10-CM

## 2023-08-14 DIAGNOSIS — Z9889 Other specified postprocedural states: Secondary | ICD-10-CM

## 2023-08-14 MED ORDER — OXYCODONE-ACETAMINOPHEN 5-325 MG PO TABS: 1.0000 | ORAL_TABLET | ORAL | 0 refills | Status: DC | PRN

## 2023-08-14 NOTE — Progress Notes (Signed)
Subjective:  Patient ID: Meghan [name], female    DOB: 07-28-1966,  MRN: [id number]  Chief Complaint  Patient presents with   Routine Post Op    PATIENT STATES SHE STILL IS HAVING A LOT OF PAIN , PATIENT IS TAKING MEDICATION THAT WAS PRESCRIBED BY DOCTOR AND WILL LIKE A REFILL OF MEDICATION PATIENT STATES.    DOS: 07/22/2023 Procedure: Right painful hardware removal  57 y.o. female returns for post-op check.  She states she is doing good.  She has some pain denies any other acute complaints.  No nausea fever chills vomiting bandages clean dry and intact  Review of Systems: Negative except as noted in the HPI. Denies N/V/F/Ch.  Past Medical History:  Diagnosis Date   (HFpEF) heart failure with preserved ejection fraction (HCC) 01/2019   a.) Dx'd in [place]; b.) TTE 08/14/2022: EF 60-65%, mod LVH, mild LAE, triv MR   Anxiety    Asthma    Atrial fibrillation and flutter (HCC) 02/16/2019   a.) CHA2DS2VASc = 6 (sex, HFpEF, HTN, CVA x2, T2DM);  b.) s/p DCCV (200 J x 1) 08/22/2022; c.) s/p RF ablation (CTI) 11/02/2022; d.) rate/rhythm maintained on oral flecainide + metoprolol succinate; chronically anticoagulated with rivaroxaban   Class 3 severe obesity due to excess calories with body mass index (BMI) of 50.0 to 59.9 in adult [facility])    DDD (degenerative disc disease), lumbar    GERD (gastroesophageal reflux disease)    Hiatal hernia    History of head injury    HTN (hypertension)    Hypercholesterolemia    Ileus (HCC)    Intractable nausea and vomiting 11/16/2020   Long term current use of anticoagulant    a.) rivaroxaban   Macular degeneration of left eye    MDD (major depressive disorder)    OA (osteoarthritis)    OSA on CPAP    Pneumonia    Presence of IVC filter    PTSD (post-traumatic stress disorder)    Recurrent pulmonary emboli (HCC)    a.) s/p IVC filter placement 2003; b.) previously followed by hematology when living in [place], [place]   Seizure disorder [facility])     a.) last was in 2020 per pt report   Stroke (HCC)    1989 and 1995 (left sided weakness)   T2DM (type 2 diabetes mellitus) (HCC)    Vitamin D deficiency     Current Outpatient Medications:    albuterol (VENTOLIN HFA) 108 (90 Base) MCG/ACT inhaler, Inhale 2 puffs into the lungs every 6 (six) hours as needed for wheezing or shortness of breath., Disp: , Rfl:    atorvastatin (LIPITOR) 40 MG tablet, Take 40 mg by mouth every evening., Disp: , Rfl:    Cholecalciferol (VITAMIN D-3) 125 MCG (5000 UT) TABS, Take 5,000 Units by mouth daily., Disp: , Rfl:    ibuprofen (ADVIL) 800 MG tablet, Take 1 tablet (800 mg total) by mouth every 6 (six) hours as needed., Disp: 60 tablet, Rfl: 1   metFORMIN (GLUCOPHAGE) 500 MG tablet, Take 500 mg by mouth daily with breakfast., Disp: , Rfl:    MOUNJARO 2.5 MG/0.5ML Pen, Inject 2.5 mg into the skin once a week., Disp: , Rfl:    Omega-3 Fatty Acids (FISH OIL) 1200 MG CAPS, Take 1,200 mg by mouth daily with breakfast., Disp: , Rfl:    omeprazole (PRILOSEC) 40 MG capsule, Take 40 mg by mouth 2 (two) times daily., Disp: , Rfl:    rivaroxaban (XARELTO) 20 MG TABS tablet,  Take 20 mg by mouth at bedtime. , Disp: , Rfl:    sertraline (ZOLOFT) 50 MG tablet, Take 50 mg by mouth in the morning., Disp: , Rfl:    acetaminophen (TYLENOL) 500 MG tablet, Take 1,000 mg by mouth every 6 (six) hours as needed for moderate pain., Disp: , Rfl:    bumetanide (BUMEX) 1 MG tablet, Take 1 tablet (1 mg total) by mouth daily for 10 days. (Patient taking differently: Take 1 mg by mouth every morning.), Disp: 10 tablet, Rfl: 0   oxyCODONE-acetaminophen (PERCOCET) 5-325 MG tablet, Take 1 tablet by mouth every 4 (four) hours as needed for severe pain., Disp: 30 tablet, Rfl: 0   oxyCODONE-acetaminophen (PERCOCET) 5-325 MG tablet, Take 1 tablet by mouth every 4 (four) hours as needed for severe pain (pain score 7-10)., Disp: 30 tablet, Rfl: 0   oxyCODONE-acetaminophen (PERCOCET) 5-325 MG tablet,  Take 1-2 tablets by mouth every 4 (four) hours as needed for severe pain (pain score 7-10)., Disp: 30 tablet, Rfl: 0  Social History   Tobacco Use  Smoking Status Never  Smokeless Tobacco Never    Allergies  Allergen Reactions   Acetaminophen-Codeine Swelling, Rash and Other (See Comments)    Tylenol with Codeine, Tylenol #3,  (facial swelling, hives)   Bee Venom Anaphylaxis   Meloxicam Anaphylaxis and Rash   Tramadol Hives and Other (See Comments)    Patient stated "I was trippin' and I do not want that ever again"   Codeine Swelling    hives   Coconut (Cocos Nucifera) Rash and Other (See Comments)    ANY coconut products    Tomato Rash    Fresh    Objective:  There were no vitals filed for this visit. There is no height or weight on file to calculate BMI. Constitutional Well developed. Well nourished.  Vascular Foot warm and well perfused. Capillary refill normal to all digits.   Neurologic Normal speech. Oriented to person, place, and time. Epicritic sensation to light touch grossly present bilaterally.  Dermatologic Skin completely epithelialized.  No signs of dehiscence noted no complication noted.  Orthopedic: No further tenderness to palpation noted about the surgical site.   Radiographs: 3 views of skeletally mature right foot: No hardware noted.  Good correction alignment noted fusion of the first metatarsophalangeal joint noted Assessment:   No diagnosis found.  Plan:  Patient was evaluated and treated and all questions answered.  S/p foot surgery right -Clinically healed and officially discharged from my care.  If any foot and ankle issues arise in the future she will come back and see me.  Good reduction of deformity noted.  Skin completely epithelialized no signs of infection noted  No follow-ups on file.

## 2023-08-21 ENCOUNTER — Ambulatory Visit (HOSPITAL_COMMUNITY): Payer: 59 | Admitting: Licensed Clinical Social Worker

## 2023-08-26 ENCOUNTER — Ambulatory Visit (INDEPENDENT_AMBULATORY_CARE_PROVIDER_SITE_OTHER): Payer: 59 | Admitting: Licensed Clinical Social Worker

## 2023-08-26 DIAGNOSIS — F4310 Post-traumatic stress disorder, unspecified: Secondary | ICD-10-CM

## 2023-08-26 DIAGNOSIS — F331 Major depressive disorder, recurrent, moderate: Secondary | ICD-10-CM | POA: Diagnosis not present

## 2023-08-26 DIAGNOSIS — F411 Generalized anxiety disorder: Secondary | ICD-10-CM | POA: Diagnosis not present

## 2023-08-26 NOTE — Progress Notes (Signed)
Virtual Visit via Video Note   I connected with Meghan [name] on 08/26/23 at 3:00pm by video enabled telemedicine application and verified that I am speaking with the correct person using two identifiers.   I discussed the limitations, risks, security and privacy concerns of performing an evaluation and management service by video and the availability of in person appointments. I also discussed with the patient that there may be a patient responsible charge related to this service. The patient expressed understanding and agreed to proceed.   I discussed the assessment and treatment plan with the patient. The patient was provided an opportunity to ask questions and all were answered. The patient agreed with the plan and demonstrated an understanding of the instructions.   The patient was advised to call back or seek an in-person evaluation if the symptoms worsen or if the condition fails to improve as anticipated.   I provided 33 minutes of non-face-to-face time during this encounter.   Noralee Stain, LCSW, LCAS _______________________ THERAPIST PROGRESS NOTE   Session Time:  3:00pm - 3:33pm  Location: Patient: Patient Home Provider: Home Office     Participation Level: Active    Behavioral Response: Alert, casually dressed, anxious mood/affect    Type of Therapy:  Individual Therapy   Treatment Goals addressed: Mood management; Medication management  Progress Towards Goals: Progressing    Interventions: CBT: challenging anxious thoughts    Summary: Meghan [name] is a 57 year old divorced African American female that presented for therapy appointment today with diagnoses of Major Depressive Disorder, recurrent, moderate, Generalized Anxiety Disorder and PTSD.      Suicidal/Homicidal: None; without plan or intent                                                                                                                Therapist Response:  Clinician met with Meghan [name] for virtual  therapy appointment and assessed for safety, sobriety, and medication compliance.  Meghan [name] presented for session on time and was alert, oriented x5, with no evidence or self-report of active SI/HI or A/V H.  Meghan [name] reported ongoing compliance with medication and denied any use of alcohol or illicit substances.  Clinician inquired about Meghan [name]'s current emotional ratings, as well as any significant changes in thoughts, feelings, or behavior since previous check-in.  Meghan [name] reported scores of 0/10 for depression, 3/10 for anxiety, and 0/10 for anger/irritability.  Meghan [name] denied any recent panic attacks or outbursts.  Meghan [name] reported that a recent success was spending the weekend with hey boyfriend and going to a Christmas event.  Meghan [name] reported that he has been supportive and there are no red flags at present.  Meghan [name] reported that a present struggle has been worrying about an upcoming appointment where she has to get an injection with a large needle.  Clinician utilized handout in session today titled "Worry exploration" in order to assist Meghan [name] in reducing her anxiety related to upcoming appointment.  This worksheet featured a series of Socratic questions aimed at exploring the most likely  outcomes for a situation of concern, rather than focusing on the worst possible outcome (i.e. catastrophizing).  Clinician assisted Meghan [name] in identifying and challenging any irrational beliefs related to this worry, in addition to utilizing problem solving approach to explore strategies which would help her accomplish goal of having procedure done with minimal distress.  Meghan [name] actively participated in discussion on handout, reporting that there is sufficient evidence to suggest she will be okay, as the doctor was patient with her in explaining what to expect, showed her the needle to desensitize her, and she will have her boyfriend with her for support.  She reported that she was also recommended to take Tylenol beforehand to help with  pain, and plans to rest afterward to avoid overexerting herself.  Intervention was effective, as evidenced by Meghan [name] reporting that discussion on this subject reduced her anxiety about upcoming appointment, and increased her confidence in her ability to follow through with procedure and recuperate relatively quickly.  Meghan [name] stated "I'm not worried about it anymore".  Clinician will continue to monitor.          Plan: Follow up again in 1-2 weeks.    Diagnosis: Major depressive disorder, recurrent, moderate; Generalized Anxiety Disorder; and PTSD   Collaboration of Care:   No collaboration of care required for this visit.                                                   Patient/Guardian was advised Release of Information must be obtained prior to any record release in order to collaborate their care with an outside provider. Patient/Guardian was advised if they have not already done so to contact the registration department to sign all necessary forms in order for [name] to release information regarding their care.    Consent: Patient/Guardian gives verbal consent for treatment and assignment of benefits for services provided during this visit. Patient/Guardian expressed understanding and agreed to proceed.  Noralee Stain, LCSW, LCAS 08/26/23

## 2023-09-02 ENCOUNTER — Ambulatory Visit: Payer: 59 | Admitting: Primary Care

## 2023-09-17 ENCOUNTER — Ambulatory Visit (HOSPITAL_COMMUNITY): Payer: 59 | Admitting: Licensed Clinical Social Worker

## 2023-09-17 DIAGNOSIS — F411 Generalized anxiety disorder: Secondary | ICD-10-CM

## 2023-09-17 DIAGNOSIS — F331 Major depressive disorder, recurrent, moderate: Secondary | ICD-10-CM | POA: Diagnosis not present

## 2023-09-17 DIAGNOSIS — F4310 Post-traumatic stress disorder, unspecified: Secondary | ICD-10-CM | POA: Diagnosis not present

## 2023-09-17 NOTE — Progress Notes (Signed)
 Virtual Visit via Video Note   I connected with Meghan [name] on 09/17/23 at 3:00pm by video enabled telemedicine application and verified that I am speaking with the correct person using two identifiers.   I discussed the limitations, risks, security and privacy concerns of performing an evaluation and management service by video and the availability of in person appointments. I also discussed with the patient that there may be a patient responsible charge related to this service. The patient expressed understanding and agreed to proceed.   I discussed the assessment and treatment plan with the patient. The patient was provided an opportunity to ask questions and all were answered. The patient agreed with the plan and demonstrated an understanding of the instructions.   The patient was advised to call back or seek an in-person evaluation if the symptoms worsen or if the condition fails to improve as anticipated.   I provided 38 minutes of non-face-to-face time during this encounter.   Meghan Ricker, LCSW, LCAS _______________________ THERAPIST PROGRESS NOTE   Session Time:  3:00pm - 3:38pm   Location: Patient: Patient Home Provider: [facility]     Participation Level: Active    Behavioral Response: Alert, casually dressed, euthymic mood/affect    Type of Therapy:  Individual Therapy   Treatment Goals addressed: Mood management; Medication management; Managing boundaries with support system   Progress Towards Goals: Progressing    Interventions: CBT, psychoeducation on healthy boundaries    Summary: Meghan [name] is a 57 year old divorced African American female that presented for therapy appointment today with diagnoses of Major Depressive Disorder, recurrent, moderate, Generalized Anxiety Disorder and PTSD.      Suicidal/Homicidal: None; without plan or intent                                                                                                                 Therapist Response:  Meghan met with Meghan [name] for virtual therapy session and assessed for safety, sobriety, and medication compliance.  Meghan [name] presented for appointment on time and was alert, oriented x5, with no evidence or self-report of active SI/HI or A/V H.  Meghan [name] reported ongoing compliance with medication and denied any use of alcohol or illicit substances.  Meghan inquired about Meghan [name] emotional ratings today, as well as any significant changes in thoughts, feelings, or behavior since last check-in.  Meghan [name] reported scores of 0/10 for depression, 0/10 for anxiety, and 0/10 for anger/irritability.  Meghan [name] denied any recent panic attacks or outbursts.  Meghan [name] reported that a recent success was visiting family in [place]  for her birthday 2 weeks ago.  Meghan [name] reported that before she went on this trip, her boyfriend proposed to her, and she accepted.  Meghan [name] reported that although she is excited about getting closer to this person, she is still trying to figure out appropriate boundaries between them.  Meghan revisited psychoeducation on subject of boundaries with Meghan [name] today using a handout.  This handout defined boundaries as the limits and rules that we set for ourselves  within relationships, and featured a breakdown of the 3 common categories of boundaries (i.e. porous, rigid, and healthy), along with typical traits specific to each one for easy identification.  It was noted that most people have a mixture of different boundary types depending on setting, person, and culture.  Meghan [name] with identifying which types of boundaries she presently has within her own support system, the collective impact these boundaries have upon her mental health, and changes that could be made in order to more effectively communicate her mental health needs.  Intervention was effective, as evidenced by [name] actively engaging in discussion on topic and reporting that this helped her realize how much progress  she has made in establishing healthier boundaries with her network.  Meghan [name] reported that she used to be very rigid in regard to romantic relationships and never thought she would get married again, but she has been more careful to monitor for red flags based upon previous therapy sessions.  Meghan [name] reported that he cooks for her and cleans the apartment, can be respectful and kind.  She reported that he also agreed to do premarital counseling and wait a few months to ensure they are fully compatible before they are wed, and she will use that as an opportunity to bring up boundaries with her pastor.  Meghan will continue to monitor.          Plan: Follow up again in 1-2 weeks.    Diagnosis: Major depressive disorder, recurrent, moderate; Generalized Anxiety Disorder; and PTSD   Collaboration of Care:   No collaboration of care required for this visit.                                                   Patient/Guardian was advised Release of Information must be obtained prior to any record release in order to collaborate their care with an outside provider. Patient/Guardian was advised if they have not already done so to contact the registration department to sign all necessary forms in order for us  to release information regarding their care.    Consent: Patient/Guardian gives verbal consent for treatment and assignment of benefits for services provided during this visit. Patient/Guardian expressed understanding and agreed to proceed.  Meghan [name], [place], LCAS 09/17/23

## 2023-09-25 ENCOUNTER — Emergency Department (HOSPITAL_COMMUNITY): Payer: 59

## 2023-09-25 ENCOUNTER — Encounter (HOSPITAL_COMMUNITY): Payer: Self-pay

## 2023-09-25 ENCOUNTER — Emergency Department (HOSPITAL_COMMUNITY)
Admission: EM | Admit: 2023-09-25 | Discharge: 2023-09-25 | Disposition: A | Discharge status: Home / Self Care | Payer: 59 | Attending: Emergency Medicine | Admitting: Emergency Medicine

## 2023-09-25 ENCOUNTER — Other Ambulatory Visit: Payer: Self-pay

## 2023-09-25 DIAGNOSIS — E119 Type 2 diabetes mellitus without complications: Secondary | ICD-10-CM | POA: Diagnosis not present

## 2023-09-25 DIAGNOSIS — Z7984 Long term (current) use of oral hypoglycemic drugs: Secondary | ICD-10-CM | POA: Diagnosis not present

## 2023-09-25 DIAGNOSIS — R531 Weakness: Secondary | ICD-10-CM | POA: Diagnosis not present

## 2023-09-25 DIAGNOSIS — R519 Headache, unspecified: Secondary | ICD-10-CM | POA: Diagnosis present

## 2023-09-25 DIAGNOSIS — I110 Hypertensive heart disease with heart failure: Secondary | ICD-10-CM | POA: Insufficient documentation

## 2023-09-25 DIAGNOSIS — Z7901 Long term (current) use of anticoagulants: Secondary | ICD-10-CM | POA: Insufficient documentation

## 2023-09-25 DIAGNOSIS — I509 Heart failure, unspecified: Secondary | ICD-10-CM | POA: Diagnosis not present

## 2023-09-25 DIAGNOSIS — Z79899 Other long term (current) drug therapy: Secondary | ICD-10-CM | POA: Diagnosis not present

## 2023-09-25 DIAGNOSIS — J45909 Unspecified asthma, uncomplicated: Secondary | ICD-10-CM | POA: Diagnosis not present

## 2023-09-25 DIAGNOSIS — Z8673 Personal history of transient ischemic attack (TIA), and cerebral infarction without residual deficits: Secondary | ICD-10-CM | POA: Diagnosis not present

## 2023-09-25 DIAGNOSIS — F446 Conversion disorder with sensory symptom or deficit: Secondary | ICD-10-CM | POA: Diagnosis not present

## 2023-09-25 DIAGNOSIS — R079 Chest pain, unspecified: Secondary | ICD-10-CM | POA: Diagnosis not present

## 2023-09-25 LAB — DIFFERENTIAL
Abs Immature Granulocytes: 0.03 10*3/uL (ref 0.00–0.07)
Basophils Absolute: 0 10*3/uL (ref 0.0–0.1)
Basophils Relative: 0 %
Eosinophils Absolute: 0.1 10*3/uL (ref 0.0–0.5)
Eosinophils Relative: 1 %
Immature Granulocytes: 1 %
Lymphocytes Relative: 30 %
Lymphs Abs: 1.7 10*3/uL (ref 0.7–4.0)
Monocytes Absolute: 0.5 10*3/uL (ref 0.1–1.0)
Monocytes Relative: 9 %
Neutro Abs: 3.3 10*3/uL (ref 1.7–7.7)
Neutrophils Relative %: 59 %

## 2023-09-25 LAB — CBC
HCT: 35.3 % — ABNORMAL LOW (ref 36.0–46.0)
Hemoglobin: 11.2 g/dL — ABNORMAL LOW (ref 12.0–15.0)
MCH: 26.7 pg (ref 26.0–34.0)
MCHC: 31.7 g/dL (ref 30.0–36.0)
MCV: 84.2 fL (ref 80.0–100.0)
Platelets: 225 10*3/uL (ref 150–400)
RBC: 4.19 MIL/uL (ref 3.87–5.11)
RDW: 14.8 % (ref 11.5–15.5)
WBC: 5.6 10*3/uL (ref 4.0–10.5)
nRBC: 0 % (ref 0.0–0.2)

## 2023-09-25 LAB — COMPREHENSIVE METABOLIC PANEL
ALT: 13 U/L (ref 0–44)
AST: 16 U/L (ref 15–41)
Albumin: 3.3 g/dL — ABNORMAL LOW (ref 3.5–5.0)
Alkaline Phosphatase: 167 U/L — ABNORMAL HIGH (ref 38–126)
Anion gap: 9 (ref 5–15)
BUN: 17 mg/dL (ref 6–20)
CO2: 22 mmol/L (ref 22–32)
Calcium: 9.6 mg/dL (ref 8.9–10.3)
Chloride: 105 mmol/L (ref 98–111)
Creatinine, Ser: 0.82 mg/dL (ref 0.44–1.00)
GFR, Estimated: >60 mL/min (ref >60)
Glucose, Bld: 94 mg/dL (ref 70–99)
Potassium: 3.6 mmol/L (ref 3.5–5.1)
Sodium: 136 mmol/L (ref 135–145)
Total Bilirubin: 0.6 mg/dL (ref 0.0–1.2)
Total Protein: 7 g/dL (ref 6.5–8.1)

## 2023-09-25 LAB — CBG MONITORING, ED: Glucose-Capillary: 88 mg/dL (ref 70–99)

## 2023-09-25 LAB — APTT: aPTT: 35 s (ref 24–36)

## 2023-09-25 LAB — I-STAT CHEM 8, ED
BUN: 19 mg/dL (ref 6–20)
Calcium, Ion: 1.17 mmol/L (ref 1.15–1.40)
Chloride: 107 mmol/L (ref 98–111)
Creatinine, Ser: 0.8 mg/dL (ref 0.44–1.00)
Glucose, Bld: 92 mg/dL (ref 70–99)
HCT: 32 % — ABNORMAL LOW (ref 36.0–46.0)
Hemoglobin: 10.9 g/dL — ABNORMAL LOW (ref 12.0–15.0)
Potassium: 3.7 mmol/L (ref 3.5–5.1)
Sodium: 140 mmol/L (ref 135–145)
TCO2: 25 mmol/L (ref 22–32)

## 2023-09-25 LAB — TROPONIN I (HIGH SENSITIVITY)
Troponin I (High Sensitivity): 3 ng/L (ref <18)
Troponin I (High Sensitivity): 3 ng/L (ref <18)

## 2023-09-25 LAB — PROTIME-INR
INR: 1.6 — ABNORMAL HIGH (ref 0.8–1.2)
Prothrombin Time: 19 s — ABNORMAL HIGH (ref 11.4–15.2)

## 2023-09-25 LAB — ETHANOL: Alcohol, Ethyl (B): <10 mg/dL (ref <10)

## 2023-09-25 MED ORDER — METOCLOPRAMIDE HCL 5 MG/ML IJ SOLN
10.0000 mg | Freq: Once | INTRAMUSCULAR | Status: AC
  Administered 2023-09-25: 10 mg via INTRAVENOUS
  Filled 2023-09-25: qty 2

## 2023-09-25 MED ORDER — MAGNESIUM SULFATE 2 GM/50ML IV SOLN
2.0000 g | Freq: Once | INTRAVENOUS | Status: AC
Start: 2023-09-25 — End: 2023-09-25
  Administered 2023-09-25: 2 g via INTRAVENOUS
  Filled 2023-09-25: qty 50

## 2023-09-25 MED ORDER — ACETAMINOPHEN 500 MG PO TABS
1000.0000 mg | ORAL_TABLET | Freq: Once | ORAL | Status: AC
  Administered 2023-09-25: 1000 mg via ORAL
  Filled 2023-09-25: qty 2

## 2023-09-25 NOTE — Consult Note (Signed)
 NEUROLOGY CONSULT NOTE   Date of service: September 25, 2023 Patient Name: Meghan [name] MRN:  [id number] DOB:  Nov 29, 1965 Chief Complaint: Code stroke for left-sided weakness Requesting Provider: Ellouise Richerd POUR, DO  History of Present Illness  Meghan [name] is a 58 y.o. female who has an extensive past medical history including that of paroxysmal A-fib on Xarelto  last dose yesterday, history of listed strokes versus conversion disorder with stated a big left-sided weakness, seizure-like activity with documented nonepileptic events on EMU admissions amongst multiple other comorbidities presented for evaluation of sudden onset of worst headache of her life last night at 6:30 PM followed by left-sided weakness that worsened this morning while she was at the [facility].  She reports that she had a sudden onset of a headache that started last night at 6:30 PM.  Describes this as the worst headache of her life, all over the head.  She says she has a lot of pain tolerance so she did not make much of it.  She went to the [facility] this morning and had chest discomfort and then left-sided weakness. She is seen by outpatient neurology at [facility] She has been seen with similar complaints in our emergency department in the past with negative workups.   LKW: 6:30 AM on 09/24/2023 Modified rankin score: 0-Completely asymptomatic and back to baseline post- stroke IV Thrombolysis: On Xarelto  also outside the window EVT: Clinical exam not consistent with a stroke of LVO etiology  NIHSS components Score: Comment  1a Level of Conscious 0[x]  1[]  2[]  3[]      1b LOC Questions 0[x]  1[]  2[]       1c LOC Commands 0[x]  1[]  2[]       2 Best Gaze 0[x]  1[]  2[]       3 Visual 0[x]  1[]  2[]  3[]      4 Facial Palsy 0[x]  1[]  2[]  3[]      5a Motor Arm - left 0[]  1[x]  2[]  3[]  4[]  UN[]    5b Motor Arm - Right 0[x]  1[]  2[]  3[]  4[]  UN[]    6a Motor Leg - Left 0[]  1[x]  2[]  3[]  4[]  UN[]    6b Motor Leg - Right 0[x]  1[]   2[]  3[]  4[]  UN[]    7 Limb Ataxia 0[x]  1[]  2[]  3[]  UN[]     8 Sensory 0[]  1[]  2[x]  UN[]      9 Best Language 0[x]  1[]  2[]  3[]      10 Dysarthria 0[x]  1[]  2[]  UN[]      11 Extinct. and Inattention 0[x]  1[]  2[]       TOTAL: 4      ROS  Comprehensive ROS performed and pertinent positives documented in HPI   Past History   Past Medical History:  Diagnosis Date   (HFpEF) heart failure with preserved ejection fraction (HCC) 01/2019   a.) Dx'd in [place] , [place]; b.) TTE 08/14/2022: EF 60-65%, mod LVH, mild LAE, triv MR   Anxiety    Asthma    Atrial fibrillation and flutter (HCC) 02/16/2019   a.) CHA2DS2VASc = 6 (sex, HFpEF, HTN, CVA x2, T2DM);  b.) s/p DCCV (200 J x 1) 08/22/2022; c.) s/p RF ablation (CTI) 11/02/2022; d.) rate/rhythm maintained on oral flecainide  + metoprolol  succinate; chronically anticoagulated with rivaroxaban    Class 3 severe obesity due to excess calories with body mass index (BMI) of 50.0 to 59.9 in adult [facility])    DDD (degenerative disc disease), lumbar    GERD (gastroesophageal reflux disease)    Hiatal hernia    History of head injury  HTN (hypertension)    Hypercholesterolemia    Ileus (HCC)    Intractable nausea and vomiting 11/16/2020   Long term current use of anticoagulant    a.) rivaroxaban    Macular degeneration of left eye    MDD (major depressive disorder)    OA (osteoarthritis)    OSA on CPAP    Pneumonia    Presence of IVC filter    PTSD (post-traumatic stress disorder)    Recurrent pulmonary emboli (HCC)    a.) s/p IVC filter placement 2003; b.) previously followed by hematology when living in [place] , [place]   Seizure disorder [facility])    a.) last was in 2020 per pt report   Stroke (HCC)    1989 and 1995 (left sided weakness)   T2DM (type 2 diabetes mellitus) (HCC)    Vitamin D deficiency     Past Surgical History:  Procedure Laterality Date   A-FLUTTER ABLATION N/A 11/02/2022   Procedure: A-FLUTTER ABLATION;  Surgeon: Mealor, Eulas BRAVO,  MD;  Location: [facility];  Service: Cardiovascular;  Laterality: N/A;   ARTHRODESIS METATARSALPHALANGEAL JOINT (MTPJ) Left 02/25/2023   Procedure: ARTHRODESIS METATARSALPHALANGEAL JOINT (MTPJ);  Surgeon: Tobie Franky SQUIBB, DPM;  Location: [facility] ORS;  Service: Podiatry;  Laterality: Left;   CARDIAC CATHETERIZATION  2017   in [facility] in [place]   CARDIOVERSION N/A 08/22/2022   Procedure: CARDIOVERSION;  Surgeon: Sheena Pugh, DO;  Location: [facility];  Service: Cardiovascular;  Laterality: N/A;   CESAREAN SECTION N/A 1989   CESAREAN SECTION N/A 1993   CHOLECYSTECTOMY  2003   COLONOSCOPY WITH PROPOFOL  N/A 06/05/2019   Procedure: COLONOSCOPY WITH PROPOFOL ;  Surgeon: Rollin Dover, MD;  Location: [facility];  Service: Endoscopy;  Laterality: N/A;   CYST REMOVAL WITH BONE GRAFT Left 10/18/2020   Procedure: BONE GRAFTING OF ENCHONDROMA MIDDLE PHANLANX OF LEFT MIDDLE FINGER;  Surgeon: Murrell Kuba, MD;  Location: [facility] OR;  Service: Orthopedics;  Laterality: Left;  AXILLARY BLOCK   DIAGNOSTIC LAPAROSCOPY  2015; 2017   lap hernia repair x2   ENDOVENOUS ABLATION SAPHENOUS VEIN W/ LASER Left 08/31/2021   endovenous laser ablation left greater saphenous vein and stab phlebectomy 10-20 incisions left leg by Penne Colorado MD   HALLUX FUSION Right 05/28/2022   Procedure: HALLUX FUSION METATARSAL PHALANGEAL JOIT;  Surgeon: Tobie Franky SQUIBB, DPM;  Location: [facility];  Service: Podiatry;  Laterality: Right;  BLOCK   HERNIA REPAIR  2014, 2018   umbilical hernia repair   IVC FILTER INSERTION  2003   POLYPECTOMY  06/05/2019   Procedure: POLYPECTOMY;  Surgeon: Rollin Dover, MD;  Location: [facility];  Service: Endoscopy;;   SHOULDER ARTHROSCOPY W/ ROTATOR CUFF REPAIR Right 08/28/2017   TOTAL ABDOMINAL HYSTERECTOMY  2003   UPPER GI ENDOSCOPY     WRIST ARTHROSCOPY WITH DEBRIDEMENT Left 10/18/2020   Procedure: LEFT WRIST ARTHROSCOPY WITH DEBRIDEMENT;  Surgeon: Murrell Kuba,  MD;  Location: [facility];  Service: Orthopedics;  Laterality: Left;  AXILLARY BLOCK   WRIST ARTHROSCOPY WITH DEBRIDEMENT Right 01/25/2022   Procedure: ARTHROSCOPY RIGHT WRIST WITH  DEBRIDEMENT/ SHRINKAGE;  Surgeon: Murrell Franky, MD;  Location: [facility] OR;  Service: Orthopedics;  Laterality: Right;    Family History: Family History  Problem Relation Age of Onset   Breast cancer Mother    Heart attack Father    Diabetes Father    Congestive Heart Failure Father    Breast cancer Maternal Grandmother     Social History  reports that  she has never smoked. She has never used smokeless tobacco. She reports that she does not drink alcohol and does not use drugs.  Allergies  Allergen Reactions   Acetaminophen -Codeine Swelling, Rash and Other (See Comments)    Tylenol  with Codeine, Tylenol  #3,  (facial swelling, hives)   Bee Venom Anaphylaxis   Meloxicam Anaphylaxis and Rash   Tramadol Hives and Other (See Comments)    Patient stated I was trippin' and I do not want that ever again   Codeine Swelling    hives   Coconut (Cocos Nucifera) Rash and Other (See Comments)    ANY coconut products    Tomato Rash    Fresh     Medications   Current Facility-Administered Medications:    acetaminophen  (TYLENOL ) tablet 1,000 mg, 1,000 mg, Oral, Once, Kingsley, Victoria K, DO   metoCLOPramide  (REGLAN ) injection 10 mg, 10 mg, Intravenous, Once, Kingsley, Victoria K, DO  Current Outpatient Medications:    acetaminophen  (TYLENOL ) 500 MG tablet, Take 1,000 mg by mouth every 6 (six) hours as needed for moderate pain., Disp: , Rfl:    albuterol  (VENTOLIN  HFA) 108 (90 Base) MCG/ACT inhaler, Inhale 2 puffs into the lungs every 6 (six) hours as needed for wheezing or shortness of breath., Disp: , Rfl:    atorvastatin (LIPITOR) 40 MG tablet, Take 40 mg by mouth every evening., Disp: , Rfl:    bumetanide  (BUMEX ) 1 MG tablet, Take 1 tablet (1 mg total) by mouth daily for 10 days. (Patient taking differently: Take 1  mg by mouth every morning.), Disp: 10 tablet, Rfl: 0   Cholecalciferol (VITAMIN D-3) 125 MCG (5000 UT) TABS, Take 5,000 Units by mouth daily., Disp: , Rfl:    ibuprofen  (ADVIL ) 800 MG tablet, Take 1 tablet (800 mg total) by mouth every 6 (six) hours as needed., Disp: 60 tablet, Rfl: 1   metFORMIN  (GLUCOPHAGE ) 500 MG tablet, Take 500 mg by mouth daily with breakfast., Disp: , Rfl:    MOUNJARO 2.5 MG/0.5ML Pen, Inject 2.5 mg into the skin once a week., Disp: , Rfl:    Omega-3 Fatty Acids (FISH OIL) 1200 MG CAPS, Take 1,200 mg by mouth daily with breakfast., Disp: , Rfl:    omeprazole (PRILOSEC) 40 MG capsule, Take 40 mg by mouth 2 (two) times daily., Disp: , Rfl:    oxyCODONE -acetaminophen  (PERCOCET) 5-325 MG tablet, Take 1 tablet by mouth every 4 (four) hours as needed for severe pain., Disp: 30 tablet, Rfl: 0   oxyCODONE -acetaminophen  (PERCOCET) 5-325 MG tablet, Take 1 tablet by mouth every 4 (four) hours as needed for severe pain (pain score 7-10)., Disp: 30 tablet, Rfl: 0   oxyCODONE -acetaminophen  (PERCOCET) 5-325 MG tablet, Take 1-2 tablets by mouth every 4 (four) hours as needed for severe pain (pain score 7-10)., Disp: 30 tablet, Rfl: 0   rivaroxaban  (XARELTO ) 20 MG TABS tablet, Take 20 mg by mouth at bedtime. , Disp: , Rfl:    sertraline  (ZOLOFT ) 50 MG tablet, Take 50 mg by mouth in the morning., Disp: , Rfl:   Vitals   Vitals:   09/25/23 0900  Weight: (!) 141.2 kg    Body mass index is 51.8 kg/m.  Physical Exam  General: Obese woman in no acute distress HEENT: Normocephalic atraumatic Lungs: Clear Cardiovascular: Regular rhythm Abdomen nondistended nontender Neurological exam Awake alert oriented x 3 Speech clear.  No aphasia Cranial nerves II to XII intact Motor examination with effort dependent left upper and lower extremity weakness.  Right-sided full-strength. Sensory  examination reveals diminished sensation to all modalities on the left with a sharp cut off in the midline  and splitting of vibratory sense on the forehead. Coordination examination with no gross dysmetria   Labs/Imaging/Neurodiagnostic studies   CBC:  Recent Labs  Lab 14-Oct-2023 0939 14-Oct-2023 0944  WBC 5.6  --   NEUTROABS 3.3  --   HGB 11.2* 10.9*  HCT 35.3* 32.0*  MCV 84.2  --   PLT 225  --    Basic Metabolic Panel:  Lab Results  Component Value Date   NA 140 Oct 14, 2023   K 3.7 10/14/2023   CO2 27 02/19/2023   GLUCOSE 92 10-14-23   BUN 19 Oct 14, 2023   CREATININE 0.80 10-14-2023   CALCIUM 10.2 02/19/2023   GFRNONAA >60 02/19/2023   GFRAA >60 05/11/2020   Urine Drug Screen:     Component Value Date/Time   LABOPIA NONE DETECTED 09/29/2020 1451   COCAINSCRNUR NONE DETECTED 09/29/2020 1451   LABBENZ NONE DETECTED 09/29/2020 1451   AMPHETMU NONE DETECTED 09/29/2020 1451   THCU NONE DETECTED 09/29/2020 1451   LABBARB NONE DETECTED 09/29/2020 1451    Alcohol Level     Component Value Date/Time   ETH <10 11/14/2020 1334   INR  Lab Results  Component Value Date   INR 1.1 11/07/2022   APTT  Lab Results  Component Value Date   APTT 32 09/29/2020   CT Head without contrast(Personally reviewed): No bleed.  No large stroke visible.  Aspects 10 ASSESSMENT  58year-old with past medical history as above presenting for evaluation of headache, chest pain, left-sided weakness with functional appearing/effort dependent left-sided weakness and multiple other features to suggest a nonorganic etiology of the left-sided weakness.  Very low suspicion for stroke but given the fact that she has a history of A-fib and on Xarelto , I would recommend further investigation with an MRI of the brain.  Unless there is a abnormal finding or new stroke on MRI, I do not see the need for any further inpatient neurological workup. Her chest pain workup will be per the ER.  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 Chest pain workup per the ER. Can use a migraine cocktail for headache as needed. Discussed with Dr. Ellouise.  Please call back if the MRI shows any concerning findings.  ______________________________________________________________________    Bonney Eligio Lav, MD [facility]

## 2023-09-25 NOTE — ED Triage Notes (Signed)
 PT BIB EMS from the [facility], was initally called for having chest pain, and felt like an elephant was sitting on her chest, then after getting in the EMS truck started complaining of left sided numbness and weakness, and the worst headache of her life starting last night at 1830.  At baseline patient has left sided weakness.   Aspirin  324 mg Nitroglycerin  sublingual X1 dose

## 2023-09-25 NOTE — Code Documentation (Signed)
 Arelys Glassco is a 58 year old female with a PMH of HF, AF, anxiety, asthma, GERD, seizure, stroke, DM arriving to [facility] via EMS on 09/25/2023. Pt is coming from the [facility] where she was found altered, complaining of H/A and chest pain. Pt states she has been weaker than usual on her left side, and that she has had a severe headache since last night at 1830. Pt is on Xarelto .    Stroke team at bridge for pt's arrival. Labs, CBG obtained, airway cleared by EDP. Pt to CT with team. NIHSS initially 2. Pt dysarthric and has left sensory loss. The following imaging was obtained: CT. Per Dr. Voncile, CT is negative for acute hemorrhage.     Pt back to ED room 27 where her workup will continue. She will need q 2 hr NIHSS and VS for 12 hours, then q 4. She will need to be NPO until passing a Stroke Swallow screen. She is not a candidate for thrombolytics as she is on an anticoagulant. She is not eligible for Endovascular therapy as her exam is LVO negative. Bedside handoff with [facility] RN complete.

## 2023-09-25 NOTE — Discharge Instructions (Addendum)
 You were seen in the emergency department for your headache, chest pain and worsening weakness.  Your workup today showed no signs of a new stroke or bleeding in your brain and no signs of abnormal electrolytes or heart attack.  It is unclear what is causing your symptoms today, could be due to a complex migraine.  You can continue to take Tylenol  every 6 hours as needed for headaches and should follow-up with your neurologist regarding your headaches and weakness.  You can follow-up with your cardiologist regarding the chest pain episode and your primary doctor to have your symptoms rechecked.  You should return to the emergency department if you have new numbness or weakness, significantly worsening chest pain or shortness of breath or any other new or concerning symptoms.

## 2023-09-25 NOTE — ED Notes (Signed)
 Patient transported to MRI

## 2023-09-25 NOTE — ED Provider Notes (Signed)
 [facility] AT [facility] Provider Note   CSN: [id number] Arrival date & time: 09/25/23  9070  An emergency department physician performed an initial assessment on this suspected stroke patient at 0930.  History  Chief Complaint  Patient presents with   Code Stroke    Meghan [name] is a 58 y.o. female.  Patient is a 59 year old female with a past medical history of A-fib on Xarelto , hypertension, diabetes, prior CVA with mild left-sided deficits, CHF, depression, asthma presenting to the emergency department as a code stroke.  Per EMS, the patient developed a sudden severe headache around 6:30 PM last night that she described as the worst headache of her life.  She then went to water aerobics this morning and while in water aerobics started to complain of some chest pressure.  On EMS arrival they noted that she had left-sided weakness as well as slowed speech and the patient reports her weakness feels worse compared to her usual.  She states that she is still having a headache at this time.  Patient states that her chest pain lasted for about 30 minutes and was associated with shortness of breath and nausea, denies any diaphoresis or vomiting.  She states that she is still having a headache at this time, was on the computer last night when it started.  She states she does not normally get headaches and has not had a headache like this before.  She denies any photophobia or phonophobia and denied any nausea or vomiting associated with the headache. States she has not taken anything for her headache.  The history is provided by the patient and the EMS personnel.       Home Medications Prior to Admission medications   Medication Sig Start Date End Date Taking? Authorizing Provider  albuterol  (VENTOLIN  HFA) 108 (90 Base) MCG/ACT inhaler Inhale 2 puffs into the lungs every 6 (six) hours as needed for wheezing or shortness of breath.    [provider]   atorvastatin (LIPITOR) 40 MG tablet Take 40 mg by mouth every evening. 07/26/21   [provider]  bumetanide  (BUMEX ) 1 MG tablet Take 1 tablet (1 mg total) by mouth daily for 10 days. Patient taking differently: Take 1 mg by mouth every morning. 12/25/22 07/25/23  Teresa Shelba SAUNDERS, NP  Cholecalciferol (VITAMIN D-3) 125 MCG (5000 UT) TABS Take 5,000 Units by mouth daily.    [provider]  cyclobenzaprine  (FLEXERIL ) 10 MG tablet Take 10 mg by mouth 3 (three) times daily. 08/09/23   [provider]  dicyclomine (BENTYL) 20 MG tablet Take 20 mg by mouth 3 (three) times daily. 06/12/23   [provider]  doxepin (SINEQUAN) 50 MG capsule Take 50 mg by mouth at bedtime. 08/12/23   [provider]  ibuprofen  (ADVIL ) 800 MG tablet Take 1 tablet (800 mg total) by mouth every 6 (six) hours as needed. 07/22/23   Tobie Franky SQUIBB, DPM  MEDROL  4 MG TBPK tablet Take by mouth as directed. 08/28/23   [provider]  metFORMIN  (GLUCOPHAGE ) 500 MG tablet Take 500 mg by mouth daily with breakfast.    [provider]  MOUNJARO 2.5 MG/0.5ML Pen Inject 2.5 mg into the skin once a week. 07/11/23   [provider]  MOUNJARO 7.5 MG/0.5ML Pen Inject 7.5 mg into the skin once a week. 09/04/23   [provider]  Omega-3 Fatty Acids (FISH OIL) 1200 MG CAPS Take 1,200 mg by mouth daily with  breakfast.    [provider]  omeprazole (PRILOSEC) 40 MG capsule Take 40 mg by mouth 2 (two) times daily. 08/17/20   [provider]  oxyCODONE -acetaminophen  (PERCOCET) 5-325 MG tablet Take 1-2 tablets by mouth every 4 (four) hours as needed for severe pain (pain score 7-10). 08/14/23   Tobie Franky SQUIBB, DPM  rivaroxaban  (XARELTO ) 20 MG TABS tablet Take 20 mg by mouth at bedtime.     [provider]  rizatriptan (MAXALT) 10 MG tablet Take 10 mg by mouth as needed for migraine. 07/10/23   [provider]  rosuvastatin (CRESTOR) 10  MG tablet Take 10 mg by mouth at bedtime. 07/10/23   [provider]  sertraline  (ZOLOFT ) 50 MG tablet Take 50 mg by mouth in the morning. 01/01/21   [provider]      Allergies    Acetaminophen -codeine, Bee venom, Meloxicam, Tramadol, Codeine, Coconut (cocos nucifera), and Tomato    Review of Systems   Review of Systems  Physical Exam Updated Vital Signs BP 110/74   Pulse 60   Temp 98 F (36.7 C) (Oral)   Resp (!) 22   Wt (!) 141.2 kg   SpO2 98%   BMI 51.80 kg/m  Physical Exam Vitals and nursing note reviewed.  Constitutional:      General: She is not in acute distress.    Appearance: Normal appearance.  HENT:     Head: Normocephalic and atraumatic.     Nose: Nose normal.     Mouth/Throat:     Mouth: Mucous membranes are moist.  Eyes:     Extraocular Movements: Extraocular movements intact.  Musculoskeletal:        General: Normal range of motion.     Cervical back: Normal range of motion.  Skin:    General: Skin is warm and dry.  Neurological:     Mental Status: She is alert and oriented to person, place, and time.     Comments: Subjectively decreased sensation on LUE and LLE Decreased strength in LUE/LLE, no drift in all 4 extremities Speech slow but clear  Psychiatric:        Mood and Affect: Mood normal.        Behavior: Behavior normal.     ED Results / Procedures / Treatments   Labs (all labs ordered are listed, but only abnormal results are displayed) Labs Reviewed  PROTIME-INR - Abnormal; Notable for the following components:      Result Value   Prothrombin Time 19.0 (*)    INR 1.6 (*)    All other components within normal limits  CBC - Abnormal; Notable for the following components:   Hemoglobin 11.2 (*)    HCT 35.3 (*)    All other components within normal limits  COMPREHENSIVE METABOLIC PANEL - Abnormal; Notable for the following components:   Albumin 3.3 (*)    Alkaline Phosphatase 167 (*)    All other components within  normal limits  I-STAT CHEM 8, ED - Abnormal; Notable for the following components:   Hemoglobin 10.9 (*)    HCT 32.0 (*)    All other components within normal limits  ETHANOL  APTT  DIFFERENTIAL  RAPID URINE DRUG SCREEN, HOSP PERFORMED  URINALYSIS, ROUTINE W REFLEX MICROSCOPIC  CBG MONITORING, ED  TROPONIN I (HIGH SENSITIVITY)  TROPONIN I (HIGH SENSITIVITY)    EKG EKG Interpretation Date/Time:  Wednesday September 25 2023 10:10:17 EST Ventricular Rate:  60 PR Interval:  180 QRS Duration:  95  QT Interval:  419 QTC Calculation: 419 R Axis:   83  Text Interpretation: Sinus rhythm No significant change since last tracing Confirmed by Ellouise Fine ([id number]) on 09/25/2023 10:37:01 AM  Radiology DG Chest Port 1 View Result Date: 09/25/2023 CLINICAL DATA:  Chest pain.  Seizure and heart failure. EXAM: PORTABLE CHEST 1 VIEW COMPARISON:  Chest radiograph dated 12/25/2022. FINDINGS: Mild cardiomegaly with vascular congestion. No focal consolidation, pleural effusion, or pneumothorax. No acute osseous pathology. IMPRESSION: Mild cardiomegaly with vascular congestion. Electronically Signed   By: Vanetta Chou M.D.   On: 09/25/2023 10:19   CT HEAD CODE STROKE WO CONTRAST Result Date: 09/25/2023 CLINICAL DATA:  Code stroke.  Neuro deficit, acute, stroke suspected EXAM: CT HEAD WITHOUT CONTRAST TECHNIQUE: Contiguous axial images were obtained from the base of the skull through the vertex without intravenous contrast. RADIATION DOSE REDUCTION: This exam was performed according to the departmental dose-optimization program which includes automated exposure control, adjustment of the mA and/or kV according to patient size and/or use of iterative reconstruction technique. COMPARISON:  CT Head 12/31/22 FINDINGS: Brain: No hemorrhage. No hydrocephalus. No extra-axial fluid collection. No CT evidence of an acute cortical infarct. No mass effect. No mass lesion. Vascular: No hyperdense vessel or unexpected  calcification. Skull: Normal. Negative for fracture or focal lesion. Sinuses/Orbits: No middle ear mastoid effusion. Paranasal sinuses clear. Orbits are unremarkable. Other: None. ASPECTS [facility] Stroke Program Early CT Score): 10 IMPRESSION: No hemorrhage or CT evidence of an acute cortical infarct. ASPECTS 10. Findings were paged to Dr. Voncile on 09/25/23 at 9:50 AM Electronically Signed   By: Lyndall Gore M.D.   On: 09/25/2023 09:50    Procedures Procedures    Medications Ordered in ED Medications  magnesium  sulfate IVPB 2 g 50 mL (2 g Intravenous New Bag/Given 09/25/23 1341)  metoCLOPramide  (REGLAN ) injection 10 mg (10 mg Intravenous Given 09/25/23 1004)  acetaminophen  (TYLENOL ) tablet 1,000 mg (1,000 mg Oral Given 09/25/23 1004)    ED Course/ Medical Decision Making/ A&P Clinical Course as of 09/25/23 1438  Wed Sep 25, 2023  0941 [facility] without acute abnormality, recommended MRI by neurology. [VK]  1037 Some vascular congestion on CXR. Does not appear significantly clinically volume overloaded. [VK]  1037 Initial troponin negative, will need 2 hr trop with symptoms just prior to arrival. [VK]  1255 Patient reports headache improved though not completely resolved. Speech sounds clear. MRI read and repeat troponin pending. [VK]  1417 No acute abnormality on MRI, repeat troponin negative. [VK]  1434 Patient denies any urinary symptoms and feels back to baseline. She is stable for discharge home with outpatient follow up. [VK]    Clinical Course User Index [VK] Kingsley, Zhoey Blackstock K, DO                                 Medical Decision Making This patient presents to the ED with chief complaint(s) of Headache, chest pain, weakness with pertinent past medical history of A-fib on Xarelto , hypertension, diabetes, prior CVA, CHF, depression, asthma which further complicates the presenting complaint. The complaint involves an extensive differential diagnosis and also carries with it a high risk of  complications and morbidity.    The differential diagnosis includes ICH, mass effect, CVA, TIA, ACS, arrhythmia, anemia, pneumonia, pneumothorax, pulmonary edema, pleural effusion, electrolyte abnormality, complex migraine  Additional history obtained: Additional history obtained from EMS  Records reviewed outpatient pulm records, RN records at [facility]  today  ED Course and Reassessment: Patient was made a prehospital arrival stroke alert and was immediately evaluated at the door on her arrival by neurology and myself.  Patient's airway was intact.  She did have weakness with subjective decrease sensation in left arm and left leg, last known well 6:30 PM last night.  Accu-Chek on arrival within normal range.  Patient was transported to CT scanner for head CT and will need additional chest pain workup as well.  Independent labs interpretation:  The following labs were independently interpreted: within normal range  Independent visualization of imaging: - I independently visualized the following imaging with scope of interpretation limited to determining acute life threatening conditions related to emergency care: CXR, CTH, MRI brain, which revealed no acute disease  Consultation: - Consulted or discussed management/test interpretation w/ external professional: neurology  Consideration for admission or further workup: Patient has no emergent conditions requiring admission or further work-up at this time and is stable for discharge home with primary care follow-up  Social Determinants of health: N/A    Amount and/or Complexity of Data Reviewed Labs: ordered. Radiology: ordered.  Risk OTC drugs. Prescription drug management.          Final Clinical Impression(s) / ED Diagnoses Final diagnoses:  Acute nonintractable headache, unspecified headache type  Chest pain, unspecified type    Rx / DC Orders ED Discharge Orders     None         Kingsley, Eldena Dede K, DO 09/25/23  1438

## 2023-09-25 NOTE — Progress Notes (Signed)
 Called to assist with pt complaining of chest pain.  Found pt sitting upright on bleachers on pool deck with some LOC? Syncope? Opened eyes when talked too. C/O chest pain.  Sluggish, sleepy eyed. Slow to speak. EMS already called [employer] in water aerobics class. BS at home was 108, BS checked here 121. No breakfast. Weak on Left side has been since stroke.  BP 100/70 pulse in the 80's. O2 90-92. Lifeguard place O2 via facemask.  Pt has hx of stroke x 2, afib with ablation, heart failure. Seizures Has not taken metformin , bumex  or blood thinner today. No seizures > 3 years, no meds.  Chest pain in left sided. Feels dizzy.  Allegic to codiene, tramadol, meloxicam Given report to EMS. Pt to transport to [facility]

## 2023-10-15 ENCOUNTER — Ambulatory Visit (INDEPENDENT_AMBULATORY_CARE_PROVIDER_SITE_OTHER): Payer: 59 | Admitting: Licensed Clinical Social Worker

## 2023-10-15 DIAGNOSIS — F331 Major depressive disorder, recurrent, moderate: Secondary | ICD-10-CM | POA: Diagnosis not present

## 2023-10-15 DIAGNOSIS — F411 Generalized anxiety disorder: Secondary | ICD-10-CM

## 2023-10-15 DIAGNOSIS — F4310 Post-traumatic stress disorder, unspecified: Secondary | ICD-10-CM
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
## 2023-10-17 ENCOUNTER — Ambulatory Visit (HOSPITAL_COMMUNITY)
Admission: RE | Admit: 2023-10-17 | Discharge: 2023-10-17 | Disposition: A | Discharge status: Home / Self Care | Payer: 59 | Source: Ambulatory Visit | Attending: Emergency Medicine | Admitting: Emergency Medicine

## 2023-10-17 ENCOUNTER — Encounter (HOSPITAL_COMMUNITY): Payer: Self-pay

## 2023-10-17 VITALS — BP 106/72 | HR 88 | Temp 99.1°F | Resp 18 | Ht 65.0 in | Wt 308.0 lb

## 2023-10-17 DIAGNOSIS — J029 Acute pharyngitis, unspecified: Secondary | ICD-10-CM | POA: Insufficient documentation

## 2023-10-17 DIAGNOSIS — H6123 Impacted cerumen, bilateral: Secondary | ICD-10-CM | POA: Diagnosis not present

## 2023-10-17 LAB — POCT RAPID STREP A (OFFICE): Rapid Strep A Screen: NEGATIVE

## 2023-10-17 MED ORDER — LIDOCAINE VISCOUS HCL 2 % MT SOLN: 15.0000 mL | OROMUCOSAL | 0 refills | Status: DC | PRN

## 2023-10-17 NOTE — ED Triage Notes (Signed)
"  I'm having issues with a sore throat and ear pain in both ears" - Entered by patient  Onset a few days ago but worse this morning. No runny nose, cough, or fever. No known sick exposure.    Patient tried hot tea with salt water gargles with mild relief.

## 2023-10-17 NOTE — ED Provider Notes (Signed)
[facility]    CSN: [id number] Arrival date & time: 10/17/23  1129     History   Chief Complaint Chief Complaint  Patient presents with   Sore Throat   Appointment    HPI Meghan [name] is a 58 y.o. female.  2-3 day history of sore throat and bilateral ear pain This morning throat pain worsened.  Currently rating 7/10 She is not having congestion, cough, fever, abdominal pain, NVD No known sick exposures Has used salt water gargles  Past Medical History:  Diagnosis Date   (HFpEF) heart failure with preserved ejection fraction (HCC) 01/2019   a.) Dx'd in [place], [place]; b.) TTE 08/14/2022: EF 60-65%, mod LVH, mild LAE, triv MR   Anxiety    Asthma    Atrial fibrillation and flutter (HCC) 02/16/2019   a.) CHA2DS2VASc = 6 (sex, HFpEF, HTN, CVA x2, T2DM);  b.) s/p DCCV (200 J x 1) 08/22/2022; c.) s/p RF ablation (CTI) 11/02/2022; d.) rate/rhythm maintained on oral flecainide + metoprolol succinate; chronically anticoagulated with rivaroxaban   Class 3 severe obesity due to excess calories with body mass index (BMI) of 50.0 to 59.9 in adult [facility])    DDD (degenerative disc disease), lumbar    GERD (gastroesophageal reflux disease)    Hiatal hernia    History of head injury    HTN (hypertension)    Hypercholesterolemia    Ileus (HCC)    Intractable nausea and vomiting 11/16/2020   Long term current use of anticoagulant    a.) rivaroxaban   Macular degeneration of left eye    MDD (major depressive disorder)    OA (osteoarthritis)    OSA on CPAP    Pneumonia    Presence of IVC filter    PTSD (post-traumatic stress disorder)    Recurrent pulmonary emboli (HCC)    a.) s/p IVC filter placement 2003; b.) previously followed by hematology when living in [place], [place]   Seizure disorder [facility])    a.) last was in 2020 per pt report   Stroke (HCC)    1989 and 1995 (left sided weakness)   T2DM (type 2 diabetes mellitus) (HCC)    Vitamin D deficiency     Patient  Active Problem List   Diagnosis Date Noted   Vascular complication 11/07/2022   MDD (major depressive disorder), recurrent episode, moderate (HCC) 07/30/2022   GAD (generalized anxiety disorder) 07/30/2022   PTSD (post-traumatic stress disorder) 07/30/2022   Insomnia 05/24/2022   Arthritis of first metatarsophalangeal (MTP) joint of right foot 04/25/2022   Intractable nausea and vomiting 11/16/2020   Ileus (HCC) 11/14/2020   Fecal impaction (HCC) 11/14/2020   Left-sided weakness 09/29/2020   Asthma 09/29/2020   Pre-diabetes 09/29/2020   Pain in left finger(s) 07/12/2020   Seizure-like activity (HCC) 03/25/2020   History of head injury 03/25/2020   Obstructive sleep apnea syndrome 09/04/2019   Acute medial meniscus tear of right knee 07/10/2019   Class 3 severe obesity due to excess calories with serious comorbidity and body mass index (BMI) of 50.0 to 59.9 in adult (HCC) 06/01/2019   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   Atrial fibrillation (HCC) 04/17/2019   History of pulmonary embolus (PE) 04/17/2019    Past Surgical History:  Procedure Laterality Date   A-FLUTTER ABLATION N/A 11/02/2022   Procedure: A-FLUTTER ABLATION;  Surgeon: Maurice Small, MD;  [facility];  Service: Cardiovascular;  Laterality: N/A;   ARTHRODESIS METATARSALPHALANGEAL JOINT (MTPJ) Left 02/25/2023   Procedure: ARTHRODESIS  METATARSALPHALANGEAL JOINT (MTPJ);  Surgeon: Candelaria Stagers, DPM;  Location: [facility];  Service: Podiatry;  Laterality: Left;   CARDIAC CATHETERIZATION  2017   in [facility] in [place]   CARDIOVERSION N/A 08/22/2022   Procedure: CARDIOVERSION;  Surgeon: Thomasene Ripple, DO;  [facility]: [facility];  Service: Cardiovascular;  Laterality: N/A;   CESAREAN SECTION N/A 1989   CESAREAN SECTION N/A 1993   CHOLECYSTECTOMY  2003   COLONOSCOPY WITH PROPOFOL N/A 06/05/2019   Procedure: COLONOSCOPY WITH PROPOFOL;  Surgeon: Jeani Hawking, MD;  Location: [facility];   Service: Endoscopy;  Laterality: N/A;   CYST REMOVAL WITH BONE GRAFT Left 10/18/2020   Procedure: BONE GRAFTING OF ENCHONDROMA MIDDLE PHANLANX OF LEFT MIDDLE FINGER;  Surgeon: Cindee Salt, MD;  Location: [facility];  Service: Orthopedics;  Laterality: Left;  AXILLARY BLOCK   DIAGNOSTIC LAPAROSCOPY  2015; 2017   lap hernia repair x2   ENDOVENOUS ABLATION SAPHENOUS VEIN W/ LASER Left 08/31/2021   endovenous laser ablation left greater saphenous vein and stab phlebectomy 10-20 incisions left leg by Lemar Livings MD   HALLUX FUSION Right 05/28/2022   Procedure: HALLUX FUSION METATARSAL PHALANGEAL JOIT;  Surgeon: Candelaria Stagers, DPM;  Location: [facility];  Service: Podiatry;  Laterality: Right;  BLOCK   HERNIA REPAIR  2014, 2018   umbilical hernia repair   IVC FILTER INSERTION  2003   POLYPECTOMY  06/05/2019   Procedure: POLYPECTOMY;  Surgeon: Jeani Hawking, MD;  Location: [facility];  Service: Endoscopy;;   SHOULDER ARTHROSCOPY W/ ROTATOR CUFF REPAIR Right 08/28/2017   TOTAL ABDOMINAL HYSTERECTOMY  2003   UPPER GI ENDOSCOPY     WRIST ARTHROSCOPY WITH DEBRIDEMENT Left 10/18/2020   Procedure: LEFT WRIST ARTHROSCOPY WITH DEBRIDEMENT;  Surgeon: Cindee Salt, MD;  [facility];  Service: Orthopedics;  Laterality: Left;  AXILLARY BLOCK   WRIST ARTHROSCOPY WITH DEBRIDEMENT Right 01/25/2022   Procedure: ARTHROSCOPY RIGHT WRIST WITH  DEBRIDEMENT/ SHRINKAGE;  Surgeon: Betha Loa, MD;  Location[facility];  Service: Orthopedics;  Laterality: Right;    OB History   No obstetric history on file.      Home Medications    Prior to Admission medications   Medication Sig Start Date End Date Taking? Authorizing Provider  albuterol (VENTOLIN HFA) 108 (90 Base) MCG/ACT inhaler Inhale 2 puffs into the lungs every 6 (six) hours as needed for wheezing or shortness of breath.   Yes [provider]  bumetanide (BUMEX) 1 MG tablet Take 1 tablet (1 mg total) by mouth daily for 10  days. Patient taking differently: Take 1 mg by mouth every morning. 12/25/22 10/17/23 Yes White, Adrienne R, NP  Cholecalciferol (VITAMIN D-3) 125 MCG (5000 UT) TABS Take 5,000 Units by mouth daily.   Yes [provider]  dicyclomine (BENTYL) 20 MG tablet Take 20 mg by mouth 3 (three) times daily. 06/12/23  Yes [provider]  lidocaine (XYLOCAINE) 2 % solution Use as directed 15 mLs in the mouth or throat every 3 (three) hours as needed for mouth pain. Swish/gargle and spit out 10/17/23  Yes Tonji Elliff, Lurena Joiner, PA-C  metFORMIN (GLUCOPHAGE) 500 MG tablet Take 500 mg by mouth daily with breakfast.   Yes [provider]  MOUNJARO 7.5 MG/0.5ML Pen Inject 7.5 mg into the skin once a week. 09/04/23  Yes [provider]  Omega-3 Fatty Acids (FISH OIL) 1200 MG CAPS Take 1,200 mg by mouth daily with breakfast.   Yes [provider]  omeprazole (PRILOSEC)  40 MG capsule Take 40 mg by mouth 2 (two) times daily. 08/17/20  Yes [provider]  rivaroxaban (XARELTO) 20 MG TABS tablet Take 20 mg by mouth at bedtime.    Yes [provider]  rizatriptan (MAXALT) 10 MG tablet Take 10 mg by mouth as needed for migraine. 07/10/23  Yes [provider]  rosuvastatin (CRESTOR) 10 MG tablet Take 10 mg by mouth at bedtime. 07/10/23  Yes [provider]  sertraline (ZOLOFT) 50 MG tablet Take 50 mg by mouth in the morning. 01/01/21  Yes [provider]    Family History Family History  Problem Relation Age of Onset   Breast cancer Mother    Heart attack Father    Diabetes Father    Congestive Heart Failure Father    Breast cancer Maternal Grandmother     Social History Social History   Tobacco Use   Smoking status: Never   Smokeless tobacco: Never  Vaping Use   Vaping status: Never Used  Substance Use Topics   Alcohol use: Never   Drug use: Never     Allergies   Acetaminophen-codeine, Bee venom, Meloxicam, Tramadol,  Codeine, Coconut (cocos nucifera), and Tomato   Review of Systems Review of Systems Per HPI  Physical Exam Triage Vital Signs ED Triage Vitals  Encounter Vitals Group     BP 10/17/23 1146 106/72     Systolic BP Percentile --      Diastolic BP Percentile --      Pulse Rate 10/17/23 1146 88     Resp 10/17/23 1146 18     Temp 10/17/23 1146 99.1 F (37.3 C)     Temp Source 10/17/23 1146 Oral     SpO2 10/17/23 1146 98 %     Weight 10/17/23 1146 (!) 308 lb (139.7 kg)     Height 10/17/23 1146 5\' 5"  (1.651 m)     Head Circumference --      Peak Flow --      Pain Score 10/17/23 1143 7     Pain Loc --      Pain Education --      Exclude from Growth Chart --    No data found.  Updated Vital Signs BP 106/72 (BP Location: Left Arm)   Pulse 88   Temp 99.1 F (37.3 C) (Oral)   Resp 18   Ht 5\' 5"  (1.651 m)   Wt (!) 308 lb (139.7 kg)   SpO2 98%   BMI 51.25 kg/m   Physical Exam Vitals and nursing note reviewed.  Constitutional:      General: She is not in acute distress.    Appearance: Normal appearance. She is not ill-appearing.  HENT:     Ears:     Comments: Lots of soft wax in both canals    Mouth/Throat:     Mouth: Mucous membranes are moist.     Pharynx: Oropharynx is clear. No oropharyngeal exudate or posterior oropharyngeal erythema.     Comments: Normal phonation, tolerating secretions. No tonsils are visualized. Small uvula.  Cardiovascular:     Rate and Rhythm: Normal rate and regular rhythm.     Heart sounds: Normal heart sounds.  Pulmonary:     Effort: Pulmonary effort is normal.     Breath sounds: Normal breath sounds.  Musculoskeletal:     Cervical back: Normal range of motion. No rigidity.  Lymphadenopathy:     Cervical: No cervical adenopathy.  Neurological:     Mental Status:  She is alert and oriented to person, place, and time.    UC Treatments / Results  Labs (all labs ordered are listed, but only abnormal results are displayed) Labs Reviewed   POCT RAPID STREP A (OFFICE) - Normal  CULTURE, GROUP A STREP [facility])    EKG  Radiology No results found.  Procedures Procedures   Medications Ordered in UC Medications - No data to display  Initial Impression / Assessment and Plan / UC Course  I have reviewed the triage vital signs and the nursing notes.  Pertinent labs & imaging results that were available during my care of the patient were reviewed by me and considered in my medical decision making (see chart for details).  Afebrile and well-appearing Rapid strep negative, will culture Discussed symptomatic care, continue salt water gargles, can try lidocaine gargle  Wax in bilateral ear canals Irrigation successful, canals are clear and TMs normal Patient feeling improved Discussed return precautions  Final Clinical Impressions(s) / UC Diagnoses   Final diagnoses:  Sore throat  Excessive ear wax, bilateral     Discharge Instructions      I recommend tylenol for pain Make sure you are drinking lots of fluids! Salt water gargles, lozenges, or throat spray You can also use the lidocaine gargle and spit to numb the throat We will call you if anything returns on your throat culture (2-3 days)      ED Prescriptions     Medication Sig Dispense Auth. Provider   lidocaine (XYLOCAINE) 2 % solution Use as directed 15 mLs in the mouth or throat every 3 (three) hours as needed for mouth pain. Swish/gargle and spit out 100 mL Cane Dubray, Lurena Joiner, PA-C      PDMP not reviewed this encounter.   Marlow Baars, [place] 10/17/23 1258

## 2023-10-17 NOTE — Discharge Instructions (Addendum)
I recommend tylenol for pain Make sure you are drinking lots of fluids! Salt water gargles, lozenges, or throat spray You can also use the lidocaine gargle and spit to numb the throat We will call you if anything returns on your throat culture (2-3 days)

## 2023-10-20 LAB — CULTURE, GROUP A STREP (THRC)

## 2023-10-25 ENCOUNTER — Ambulatory Visit (INDEPENDENT_AMBULATORY_CARE_PROVIDER_SITE_OTHER): Payer: 59 | Admitting: Podiatry

## 2023-10-25 ENCOUNTER — Ambulatory Visit (INDEPENDENT_AMBULATORY_CARE_PROVIDER_SITE_OTHER): Payer: 59

## 2023-10-25 ENCOUNTER — Encounter: Payer: Self-pay | Admitting: Podiatry

## 2023-10-25 VITALS — Ht 65.0 in

## 2023-10-25 DIAGNOSIS — M65972 Unspecified synovitis and tenosynovitis, left ankle and foot: Secondary | ICD-10-CM

## 2023-10-25 DIAGNOSIS — T85848A Pain due to other internal prosthetic devices, implants and grafts, initial encounter: Secondary | ICD-10-CM

## 2023-10-25 DIAGNOSIS — R200 Anesthesia of skin: Secondary | ICD-10-CM

## 2023-10-25 DIAGNOSIS — M65971 Unspecified synovitis and tenosynovitis, right ankle and foot: Secondary | ICD-10-CM

## 2023-10-25 DIAGNOSIS — Z01818 Encounter for other preprocedural examination: Secondary | ICD-10-CM | POA: Diagnosis not present

## 2023-10-25 DIAGNOSIS — M79672 Pain in left foot: Secondary | ICD-10-CM

## 2023-10-25 NOTE — Progress Notes (Signed)
 Subjective:  Patient ID: [name] GORMAN [place], female    DOB: 20-Jan-1966,  MRN: [id number]  Chief Complaint  Patient presents with   Foot Pain    She has a lot of swelling in the ankles and feet,and she is wearing shoes, and has some numbness, tingling and burning, and losing some toenails a small amount at at time and I am on fluid pills    DOS: 02/25/2023 Procedure: Left total MPJ fusion  58 y.o. female returns for post-op check.  Patient presents with left painful orthopedic hardware pain on palpation.  She states has been causing her a lot of pain wanted to get it evaluated she would like to have it removed.  The right side is feeling well.  She also has secondary complaint bilateral ankle pain for which she would like to injection she also has tertiary complaint of numbness down to the left foot.  She wanted to discuss treatment options  Denies any other acute issues  Review of Systems: Negative except as noted in the HPI. Denies N/V/F/Ch.  Past Medical History:  Diagnosis Date   (HFpEF) heart failure with preserved ejection fraction (HCC) 01/2019   a.) Dx'd in [place] , [place]; b.) TTE 08/14/2022: EF 60-65%, mod LVH, mild LAE, triv MR   Anxiety    Asthma    Atrial fibrillation and flutter (HCC) 02/16/2019   a.) CHA2DS2VASc = 6 (sex, HFpEF, HTN, CVA x2, T2DM);  b.) s/p DCCV (200 J x 1) 08/22/2022; c.) s/p RF ablation (CTI) 11/02/2022; d.) rate/rhythm maintained on oral flecainide  + metoprolol  succinate; chronically anticoagulated with rivaroxaban    Class 3 severe obesity due to excess calories with body mass index (BMI) of 50.0 to 59.9 in adult [facility])    DDD (degenerative disc disease), lumbar    GERD (gastroesophageal reflux disease)    Hiatal hernia    History of head injury    HTN (hypertension)    Hypercholesterolemia    Ileus (HCC)    Intractable nausea and vomiting 11/16/2020   Long term current use of anticoagulant    a.) rivaroxaban    Macular degeneration of left eye    MDD  (major depressive disorder)    OA (osteoarthritis)    OSA on CPAP    Pneumonia    Presence of IVC filter    PTSD (post-traumatic stress disorder)    Recurrent pulmonary emboli (HCC)    a.) s/p IVC filter placement 2003; b.) previously followed by hematology when living in [place] , [place]   Seizure disorder (HCC)    a.) last was in 2020 per pt report   Stroke (HCC)    1989 and 1995 (left sided weakness)   T2DM (type 2 diabetes mellitus) (HCC)    Vitamin D deficiency     Current Outpatient Medications:    albuterol  (VENTOLIN  HFA) 108 (90 Base) MCG/ACT inhaler, Inhale 2 puffs into the lungs every 6 (six) hours as needed for wheezing or shortness of breath., Disp: , Rfl:    Cholecalciferol (VITAMIN D-3) 125 MCG (5000 UT) TABS, Take 5,000 Units by mouth daily., Disp: , Rfl:    dicyclomine (BENTYL) 20 MG tablet, Take 20 mg by mouth 3 (three) times daily., Disp: , Rfl:    lidocaine  (XYLOCAINE ) 2 % solution, Use as directed 15 mLs in the mouth or throat every 3 (three) hours as needed for mouth pain. Swish/gargle and spit out, Disp: 100 mL, Rfl: 0   metFORMIN  (GLUCOPHAGE ) 500 MG tablet, Take 500 mg by mouth daily with breakfast.,  Disp: , Rfl:    MOUNJARO 7.5 MG/0.5ML Pen, Inject 7.5 mg into the skin once a week., Disp: , Rfl:    Omega-3 Fatty Acids (FISH OIL) 1200 MG CAPS, Take 1,200 mg by mouth daily with breakfast., Disp: , Rfl:    omeprazole (PRILOSEC) 40 MG capsule, Take 40 mg by mouth 2 (two) times daily., Disp: , Rfl:    rivaroxaban  (XARELTO ) 20 MG TABS tablet, Take 20 mg by mouth at bedtime. , Disp: , Rfl:    rizatriptan (MAXALT) 10 MG tablet, Take 10 mg by mouth as needed for migraine., Disp: , Rfl:    rosuvastatin (CRESTOR) 10 MG tablet, Take 10 mg by mouth at bedtime., Disp: , Rfl:    sertraline  (ZOLOFT ) 50 MG tablet, Take 50 mg by mouth in the morning., Disp: , Rfl:    bumetanide  (BUMEX ) 1 MG tablet, Take 1 tablet (1 mg total) by mouth daily for 10 days. (Patient taking differently:  Take 1 mg by mouth every morning.), Disp: 10 tablet, Rfl: 0  Social History   Tobacco Use  Smoking Status Never  Smokeless Tobacco Never    Allergies  Allergen Reactions   Acetaminophen -Codeine Swelling, Rash and Other (See Comments)    Tylenol  with Codeine, Tylenol  #3,  (facial swelling, hives)   Bee Venom Anaphylaxis   Meloxicam Anaphylaxis and Rash   Tramadol Hives and Other (See Comments)    Patient stated I was trippin' and I do not want that ever again   Codeine Swelling    hives   Coconut (Cocos Nucifera) Rash and Other (See Comments)    ANY coconut products    Tomato Rash    Fresh    Objective:  There were no vitals filed for this visit. Body mass index is 51.25 kg/m. Constitutional Well developed. Well nourished.  Vascular Foot warm and well perfused. Capillary refill normal to all digits.   Neurologic Normal speech. Oriented to person, place, and time. Decreased sensation to light touch grossly present bilaterally.  Dermatologic Bilateral ankle pain pain with range of motion of the joint deep intra-articular ankle pain noted.  Pain on palpation of right orthopedic hardware.  Rigid right first MPJ joint noted.  Orthopedic: No further tenderness to palpation noted about the surgical site.   Radiographs: 3 views of skeletally mature left foot: Hardware is intact no signs of backing or loosening noted.  Reduction of deformity noted Assessment:   1. Synovitis of left ankle   2. Numbness of toes   3. Pain from implanted hardware, initial encounter   4. Synovitis of right ankle      Plan:  Patient was evaluated and treated and all questions answered.   Left painful orthopedic hardware -I explained to the patient the etiology of painful hardware and worse treatment options were discussed.  -CT scan was reviewed which shows consolidation/fusion across the first metatarsal phalangeal joint.  At this time I discussed with the patient that she would benefit  from surgical removal of hardware as it was causing a lot of her pain.  I discussed with the patient in extensive detail she states understanding.  I discussed my preoperative intra postop plan with the patient extensive detail she states understanding like to proceed with surgery -Informed surgical risk consent was reviewed and read aloud to the patient.  I reviewed the films.  I have discussed my findings with the patient in great detail.  I have discussed all risks including but not limited to infection, stiffness, scarring,  limp, disability, deformity, damage to blood vessels and nerves, numbness, poor healing, need for braces, arthritis, chronic pain, amputation, death.  All benefits and realistic expectations discussed in great detail.  I have made no promises as to the outcome.  I have provided realistic expectations.  I have offered the patient a 2nd opinion, which they have declined and assured me they preferred to proceed despite the risks  No follow-ups on file.  Bilateral ankle capsulitis -A steroid injection was performed at bilateral ankle using 1% plain Lidocaine  and 10 mg of Kenalog. This was well tolerated.  Bilateral neuritis -I explained patient etiology of neuritis and worse treatment options inversion options were discussed.  At this time patient benefit from nerve conduction study to assess the nerve etiology.  She states understand would like to proceed with nerve conduction study -No conduction study was ordered

## 2023-10-28 ENCOUNTER — Telehealth: Payer: Self-pay | Admitting: Urology

## 2023-10-28 NOTE — Telephone Encounter (Signed)
 Called and LM for pt to call back when she is ready to schedule sx with Dr. Lydia Sams.

## 2023-10-30 ENCOUNTER — Ambulatory Visit (INDEPENDENT_AMBULATORY_CARE_PROVIDER_SITE_OTHER): Payer: 59 | Admitting: Licensed Clinical Social Worker

## 2023-10-30 DIAGNOSIS — F331 Major depressive disorder, recurrent, moderate: Secondary | ICD-10-CM | POA: Diagnosis not present

## 2023-10-30 DIAGNOSIS — F411 Generalized anxiety disorder: Secondary | ICD-10-CM | POA: Diagnosis not present

## 2023-10-30 DIAGNOSIS — F4310 Post-traumatic stress disorder, unspecified: Secondary | ICD-10-CM | POA: Diagnosis not present
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
## 2023-11-01 ENCOUNTER — Telehealth: Payer: Self-pay

## 2023-11-01 NOTE — Telephone Encounter (Signed)
Spoke to patient and notified -thanks

## 2023-11-01 NOTE — Telephone Encounter (Signed)
-----   Message from Candelaria Stagers sent at 11/01/2023  9:31 AM EST ----- Regarding: CT scan left foot [facility] [place],  Can you let this patient know that I placed an order for CT scan for the left foot to assess for arthrodesis of the first MPJ.  Have her get CT scan before surgery

## 2023-11-04 ENCOUNTER — Ambulatory Visit (INDEPENDENT_AMBULATORY_CARE_PROVIDER_SITE_OTHER): Payer: 59 | Admitting: Licensed Clinical Social Worker

## 2023-11-04 DIAGNOSIS — F4310 Post-traumatic stress disorder, unspecified: Secondary | ICD-10-CM | POA: Diagnosis not present

## 2023-11-04 DIAGNOSIS — F331 Major depressive disorder, recurrent, moderate: Secondary | ICD-10-CM | POA: Diagnosis not present

## 2023-11-04 DIAGNOSIS — F411 Generalized anxiety disorder: Secondary | ICD-10-CM

## 2023-11-04 NOTE — Progress Notes (Signed)
Virtual Visit via Video Note   I connected with Meghan [name] on 11/04/23 at 3:00pm by video enabled telemedicine application and verified that I am speaking with the correct person using two identifiers.   I discussed the limitations, risks, security and privacy concerns of performing an evaluation and management service by video and the availability of in person appointments. I also discussed with the patient that there may be a patient responsible charge related to this service. The patient expressed understanding and agreed to proceed.   I discussed the assessment and treatment plan with the patient. The patient was provided an opportunity to ask questions and all were answered. The patient agreed with the plan and demonstrated an understanding of the instructions.   The patient was advised to call back or seek an in-person evaluation if the symptoms worsen or if the condition fails to improve as anticipated.   I provided 34 minutes of non-face-to-face time during this encounter.   Meghan Stain, LCSW, LCAS _______________________ THERAPIST PROGRESS NOTE   Session Time:  3:00pm - 3:34pm  Location: Patient: Patient Home Provider: Home Office     Participation Level: Active    Behavioral Response: Alert, casually dressed, anxious mood/affect   Type of Therapy:  Individual Therapy   Treatment Goals addressed: Mood management; Medication management  Progress Towards Goals: Progressing   Interventions: CBT: core beliefs, supportive therapy    Summary: Meghan [name] is a 58 year old divorced African American female that presented for therapy appointment today with diagnoses of Major Depressive Disorder, recurrent, moderate, Generalized Anxiety Disorder and PTSD.      Suicidal/Homicidal: None; without plan or intent                                                                                                                Therapist Response:  Clinician met with Meghan [name] for virtual  therapy appointment and assessed for safety, sobriety, and medication compliance.  Meghan [name] presented for session on time and was alert, oriented x5, with no evidence or self-report of active SI/HI or A/V H.  Meghan [name] reported ongoing compliance with medication and denied any use of alcohol or illicit substances.  Clinician inquired about Meghan [name]'s current emotional ratings, as well as any significant changes in thoughts, feelings, or behavior since previous check-in.  Meghan [name] reported scores of 0/10 for depression, 6/10 for anxiety, and 0/10 for anger/irritability.  Meghan [name] denied any recent panic attacks or outbursts.  Meghan [name] reported that a recent success was having several teeth removed by the dentist, and experiencing minimal pain or discomfort as of today. She reported that she was initially anxious upon arriving at the appointment, but practiced her coping skills to lower blood pressure and remain calm.  Meghan [name] reported that a struggle was learning that a member of her previous church passed away unexpectedly this week, and processing grief.  Meghan [name] reported that at times, she still struggles with the belief that she needs to be 'strong' for others, and compartmentalize her feelings.  Clinician revisited topic of  core beliefs with Meghan [name] to guide discussion.  Clinician reminded Meghan [name] that core beliefs as the most central ideas one holds about themselves, which can also influence automatic negative thoughts.  Clinician utilized CBT concept to explain how this thinking affects feelings and behavior in turn, and examples of common negative core beliefs were listed, such as "I am a failure", "I am unlovable", and "Nothing ever goes right".  Consequences of allowing core beliefs to remain were listed as well, including tendency to distrust others, aggressive behavior, unhealthy boundaries, and worsening anxiety, depression, or reliance upon drugs/alcohol to cope.  Clinician tasked Meghan [name] with identifying current harmful core  beliefs that could impede therapy progress, and discussed strategies for countering negative thoughts in order to resolve them, such as reciting daily positive affirmations, or looking for evidence that contradicts these beliefs.  Intervention was effective, as evidenced by Meghan [name]'s active engagement in discussion on subject, reporting that when she was younger, she felt that displaying emotions like grief could make her look weak, or prevent her from being able to support family.  Meghan [name] reported that as she has gotten older, experience more loss, and learned how compartmentalization can be a problem for her mental health, she is able to let her guard down, and even seek support from others without fear of judgement or misunderstanding.  Meghan [name] stated "I'm getting better with it.  When I hear about people passing now, I let me feelings out. I'm growing, and learning to speak my mind".  Meghan [name] asked to end session early in order to attend a separate medical appointment.  Clinician was agreeable to this, and will continue to monitor.          Plan: Follow up again in 1-2 weeks.    Diagnosis: Major depressive disorder, recurrent, moderate; Generalized Anxiety Disorder; and PTSD   Collaboration of Care:   No collaboration of care required for this visit.                                                   Patient/Guardian was advised Release of Information must be obtained prior to any record release in order to collaborate their care with an outside provider. Patient/Guardian was advised if they have not already done so to contact the registration department to sign all necessary forms in order for [name] to release information regarding their care.    Consent: Patient/Guardian gives verbal consent for treatment and assignment of benefits for services provided during this visit. Patient/Guardian expressed understanding and agreed to proceed.  Meghan Stain, LCSW, LCAS 11/04/23

## 2023-11-08 ENCOUNTER — Ambulatory Visit: Payer: 59 | Admitting: Podiatry

## 2023-11-08 ENCOUNTER — Encounter: Payer: Self-pay | Admitting: *Deleted

## 2023-11-08 ENCOUNTER — Ambulatory Visit
Admission: EM | Admit: 2023-11-08 | Discharge: 2023-11-08 | Disposition: A | Discharge status: Home / Self Care | Payer: 59 | Attending: Internal Medicine | Admitting: Internal Medicine

## 2023-11-08 ENCOUNTER — Ambulatory Visit (INDEPENDENT_AMBULATORY_CARE_PROVIDER_SITE_OTHER): Payer: 59

## 2023-11-08 DIAGNOSIS — R8290 Unspecified abnormal findings in urine: Secondary | ICD-10-CM | POA: Insufficient documentation

## 2023-11-08 DIAGNOSIS — M545 Low back pain, unspecified: Secondary | ICD-10-CM | POA: Diagnosis present

## 2023-11-08 DIAGNOSIS — M4316 Spondylolisthesis, lumbar region: Secondary | ICD-10-CM | POA: Insufficient documentation

## 2023-11-08 LAB — POCT URINALYSIS DIP (MANUAL ENTRY)
Bilirubin, UA: NEGATIVE
Glucose, UA: NEGATIVE mg/dL
Ketones, POC UA: NEGATIVE mg/dL
Nitrite, UA: NEGATIVE
Protein Ur, POC: NEGATIVE mg/dL
Spec Grav, UA: 1.01 (ref 1.010–1.025)
Urobilinogen, UA: 0.2 U/dL
pH, UA: 5.5 (ref 5.0–8.0)

## 2023-11-08 MED ORDER — BACLOFEN 10 MG PO TABS: 10.0000 mg | ORAL_TABLET | Freq: Two times a day (BID) | ORAL | 0 refills | Status: DC | PRN

## 2023-11-08 MED ORDER — LIDOCAINE 5 % EX PTCH: 1.0000 | MEDICATED_PATCH | CUTANEOUS | 0 refills | Status: DC

## 2023-11-08 NOTE — ED Provider Notes (Signed)
[facility]    CSN: [id number] Arrival date & time: 11/08/23  1247      History   Chief Complaint Chief Complaint  Patient presents with   Flank Pain    HPI Meghan [name] is a 58 y.o. female.   Patient presents today with a several hour history of left lower back pain.  She denies any recent trauma, change in activity, injury.  She denies previous injury or surgery involving her back.  Pain is rated 10 on a 0-10 pain scale, localized to her lumbar left back region without radiation, described as nagging with occasional sharp pain, no alleviating factors identified.  She denies any urinary symptoms including frequency, urgency, hematuria.  Denies any fever, abdominal pain, nausea, vomiting.  She has tried Tylenol without improvement of symptoms.  She is unable to take NSAIDs because of chronic anticoagulation related to atrial fibrillation.  She is having difficulty with her daily activities as a result of symptoms.  She denies any personal history of malignancy.  She denies any bowel/bladder incontinence, lower extremity weakness, saddle anesthesia.    Past Medical History:  Diagnosis Date   (HFpEF) heart failure with preserved ejection fraction (HCC) 01/2019   a.) Dx'd in [place], [place]; b.) TTE 08/14/2022: EF 60-65%, mod LVH, mild LAE, triv MR   Anxiety    Asthma    Atrial fibrillation and flutter (HCC) 02/16/2019   a.) CHA2DS2VASc = 6 (sex, HFpEF, HTN, CVA x2, T2DM);  b.) s/p DCCV (200 J x 1) 08/22/2022; c.) s/p RF ablation (CTI) 11/02/2022; d.) rate/rhythm maintained on oral flecainide + metoprolol succinate; chronically anticoagulated with rivaroxaban   Class 3 severe obesity due to excess calories with body mass index (BMI) of 50.0 to 59.9 in adult [facility])    DDD (degenerative disc disease), lumbar    GERD (gastroesophageal reflux disease)    Hiatal hernia    History of head injury    HTN (hypertension)    Hypercholesterolemia    Ileus (HCC)    Intractable  nausea and vomiting 11/16/2020   Long term current use of anticoagulant    a.) rivaroxaban   Macular degeneration of left eye    MDD (major depressive disorder)    OA (osteoarthritis)    OSA on CPAP    Pneumonia    Presence of IVC filter    PTSD (post-traumatic stress disorder)    Recurrent pulmonary emboli (HCC)    a.) s/p IVC filter placement 2003; b.) previously followed by hematology when living in [place], [place]   Seizure disorder [facility])    a.) last was in 2020 per pt report   Stroke (HCC)    1989 and 1995 (left sided weakness)   T2DM (type 2 diabetes mellitus) (HCC)    Vitamin D deficiency     Patient Active Problem List   Diagnosis Date Noted   Vascular complication 11/07/2022   MDD (major depressive disorder), recurrent episode, moderate (HCC) 07/30/2022   GAD (generalized anxiety disorder) 07/30/2022   PTSD (post-traumatic stress disorder) 07/30/2022   Insomnia 05/24/2022   Arthritis of first metatarsophalangeal (MTP) joint of right foot 04/25/2022   Intractable nausea and vomiting 11/16/2020   Ileus (HCC) 11/14/2020   Fecal impaction (HCC) 11/14/2020   Left-sided weakness 09/29/2020   Asthma 09/29/2020   Pre-diabetes 09/29/2020   Pain in left finger(s) 07/12/2020   Seizure-like activity (HCC) 03/25/2020   History of head injury 03/25/2020   Obstructive sleep apnea syndrome 09/04/2019   Acute medial meniscus  tear of right knee 07/10/2019   Class 3 severe obesity due to excess calories with serious comorbidity and body mass index (BMI) of 50.0 to 59.9 in adult [facility]) 06/01/2019   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   Atrial fibrillation (HCC) 04/17/2019   History of pulmonary embolus (PE) 04/17/2019    Past Surgical History:  Procedure Laterality Date   A-FLUTTER ABLATION N/A 11/02/2022   Procedure: A-FLUTTER ABLATION;  Surgeon: Maurice Small, MD;  [facility]: [facility];  Service: Cardiovascular;  Laterality: N/A;   ARTHRODESIS METATARSALPHALANGEAL  JOINT (MTPJ) Left 02/25/2023   Procedure: ARTHRODESIS METATARSALPHALANGEAL JOINT (MTPJ);  Surgeon: Candelaria Stagers, DPM;  Location: [facility] ORS;  Service: Podiatry;  Laterality: Left;   CARDIAC CATHETERIZATION  2017   in [name] in [place]   CARDIOVERSION N/A 08/22/2022   Procedure: CARDIOVERSION;  Surgeon: Thomasene Ripple, DO;  Location: [facility];  Service: Cardiovascular;  Laterality: N/A;   CESAREAN SECTION N/A 1989   CESAREAN SECTION N/A 1993   CHOLECYSTECTOMY  2003   COLONOSCOPY WITH PROPOFOL N/A 06/05/2019   Procedure: COLONOSCOPY WITH PROPOFOL;  Surgeon: Jeani Hawking, MD;  Location: [facility];  Service: Endoscopy;  Laterality: N/A;   CYST REMOVAL WITH BONE GRAFT Left 10/18/2020   Procedure: BONE GRAFTING OF ENCHONDROMA MIDDLE PHANLANX OF LEFT MIDDLE FINGER;  Surgeon: Cindee Salt, MD;  Location: [facility] OR;  Service: Orthopedics;  Laterality: Left;  AXILLARY BLOCK   DIAGNOSTIC LAPAROSCOPY  2015; 2017   lap hernia repair x2   ENDOVENOUS ABLATION SAPHENOUS VEIN W/ LASER Left 08/31/2021   endovenous laser ablation left greater saphenous vein and stab phlebectomy 10-20 incisions left leg by Lemar Livings MD   HALLUX FUSION Right 05/28/2022   Procedure: HALLUX FUSION METATARSAL PHALANGEAL JOIT;  Surgeon: Candelaria Stagers, DPM;  Location: [facility];  Service: Podiatry;  Laterality: Right;  BLOCK   HERNIA REPAIR  2014, 2018   umbilical hernia repair   IVC FILTER INSERTION  2003   POLYPECTOMY  06/05/2019   Procedure: POLYPECTOMY;  Surgeon: Jeani Hawking, MD;  Location: [facility];  Service: Endoscopy;;   SHOULDER ARTHROSCOPY W/ ROTATOR CUFF REPAIR Right 08/28/2017   TOTAL ABDOMINAL HYSTERECTOMY  2003   UPPER GI ENDOSCOPY     WRIST ARTHROSCOPY WITH DEBRIDEMENT Left 10/18/2020   Procedure: LEFT WRIST ARTHROSCOPY WITH DEBRIDEMENT;  Surgeon: Cindee Salt, MD;  Location: [facility] OR;  Service: Orthopedics;  Laterality: Left;  AXILLARY BLOCK   WRIST ARTHROSCOPY WITH  DEBRIDEMENT Right 01/25/2022   Procedure: ARTHROSCOPY RIGHT WRIST WITH  DEBRIDEMENT/ SHRINKAGE;  Surgeon: Betha Loa, MD;  Location: [facility] OR;  Service: Orthopedics;  Laterality: Right;    OB History   No obstetric history on file.      Home Medications    Prior to Admission medications   Medication Sig Start Date End Date Taking? Authorizing Provider  albuterol (VENTOLIN HFA) 108 (90 Base) MCG/ACT inhaler Inhale 2 puffs into the lungs every 6 (six) hours as needed for wheezing or shortness of breath.   Yes [provider]  baclofen (LIORESAL) 10 MG tablet Take 1 tablet (10 mg total) by mouth 2 (two) times daily as needed for muscle spasms. 11/08/23  Yes Hakeen Shipes K, PA-C  bumetanide (BUMEX) 1 MG tablet Take 1 tablet (1 mg total) by mouth daily for 10 days. Patient taking differently: Take 1 mg by mouth every morning. 12/25/22 11/08/23 Yes White, Adrienne R, NP  Cholecalciferol (VITAMIN D-3) 125 MCG (5000 UT) TABS Take 5,000 Units  by mouth daily.   Yes [provider]  dicyclomine (BENTYL) 20 MG tablet Take 20 mg by mouth 3 (three) times daily. 06/12/23  Yes [provider]  lidocaine (LIDODERM) 5 % Place 1 patch onto the skin daily. Remove & Discard patch within 12 hours or as directed by MD 11/08/23  Yes Danesha Kirchoff, Denny Peon K, PA-C  metFORMIN (GLUCOPHAGE) 500 MG tablet Take 500 mg by mouth daily with breakfast.   Yes [provider]  MOUNJARO 7.5 MG/0.5ML Pen Inject 7.5 mg into the skin once a week. 09/04/23  Yes [provider]  Omega-3 Fatty Acids (FISH OIL) 1200 MG CAPS Take 1,200 mg by mouth daily with breakfast.   Yes [provider]  omeprazole (PRILOSEC) 40 MG capsule Take 40 mg by mouth 2 (two) times daily. 08/17/20  Yes [provider]  rivaroxaban (XARELTO) 20 MG TABS tablet Take 20 mg by mouth at bedtime.    Yes [provider]  rosuvastatin (CRESTOR) 10 MG tablet Take 10 mg by mouth at bedtime. 07/10/23  Yes [provider]  sertraline (ZOLOFT) 50 MG tablet Take 50 mg by mouth in the morning. 01/01/21  Yes [provider]  lidocaine (XYLOCAINE) 2 % solution Use as directed 15 mLs in the mouth or throat every 3 (three) hours as needed for mouth pain. Swish/gargle and spit out 10/17/23   Rising, Lurena Joiner, PA-C  rizatriptan (MAXALT) 10 MG tablet Take 10 mg by mouth as needed for migraine. 07/10/23   [provider]    Family History Family History  Problem Relation Age of Onset   Breast cancer Mother    Heart attack Father    Diabetes Father    Congestive Heart Failure Father    Breast cancer Maternal Grandmother     Social History Social History   Tobacco Use   Smoking status: Never   Smokeless tobacco: Never  Vaping Use   Vaping status: Never Used  Substance Use Topics   Alcohol use: Never   Drug use: Never     Allergies   Acetaminophen-codeine, Bee venom, Meloxicam, Tramadol, Codeine, Coconut (cocos nucifera), and Tomato   Review of Systems Review of Systems  Constitutional:  Positive for activity change. Negative for appetite change, fatigue and fever.  Respiratory:  Negative for cough and shortness of breath.   Cardiovascular:  Negative for chest pain.  Gastrointestinal:  Negative for abdominal pain, diarrhea, nausea and vomiting.  Genitourinary:  Positive for flank pain. Negative for dysuria, frequency, urgency, vaginal bleeding, vaginal discharge and vaginal pain.  Musculoskeletal:  Positive for back pain. Negative for arthralgias and myalgias.     Physical Exam Triage Vital Signs ED Triage Vitals [11/08/23 1321]  Encounter Vitals Group     BP 107/67     Systolic BP Percentile      Diastolic BP Percentile      Pulse Rate 74     Resp 16     Temp 98 F (36.7 C)     Temp Source Oral     SpO2 100 %     Weight      Height      Head Circumference      Peak Flow      Pain Score      Pain Loc      Pain Education      Exclude from Growth Chart     No data found.  Updated Vital Signs BP 107/67 (BP Location: Left Wrist)  Pulse 74   Temp 98 F (36.7 C) (Oral)   Resp 16   SpO2 100%   Visual Acuity Right Eye Distance:   Left Eye Distance:   Bilateral Distance:    Right Eye Near:   Left Eye Near:    Bilateral Near:     Physical Exam Vitals reviewed.  Constitutional:      General: She is awake. She is not in acute distress.    Appearance: Normal appearance. She is well-developed. She is not ill-appearing.     Comments: Very pleasant female appears stated age in no acute distress sitting comfortably in exam room  HENT:     Head: Normocephalic and atraumatic.     Nose: Nose normal.  Cardiovascular:     Rate and Rhythm: Normal rate and regular rhythm.     Heart sounds: S1 normal and S2 normal. Murmur heard.  Pulmonary:     Effort: Pulmonary effort is normal.     Breath sounds: Normal breath sounds. No wheezing, rhonchi or rales.     Comments: Clear to auscultation bilaterally Abdominal:     General: Bowel sounds are normal.     Palpations: Abdomen is soft.     Tenderness: There is no abdominal tenderness. There is no right CVA tenderness, left CVA tenderness, guarding or rebound.  Musculoskeletal:     Cervical back: No tenderness or bony tenderness.     Thoracic back: No tenderness or bony tenderness.     Lumbar back: Tenderness and bony tenderness present. Positive left straight leg raise test. Negative right straight leg raise test.       Back:     Comments: Pain with percussion of vertebrae of the lumbar spine.  No deformity noted.  Significant tenderness palpation of left lumbar paraspinal muscles.  Positive straight leg raise on left.  Strength 5/5 bilateral lower extremities.  Psychiatric:        Behavior: Behavior is cooperative.      UC Treatments / Results  Labs (all labs ordered are listed, but only abnormal results are displayed) Labs Reviewed  POCT URINALYSIS DIP (MANUAL ENTRY) - Abnormal; Notable  for the following components:      Result Value   Clarity, UA hazy (*)    Blood, UA trace-intact (*)    Leukocytes, UA Small (1+) (*)    All other components within normal limits  URINE CULTURE    EKG   Radiology No results found.  Procedures Procedures (including critical care time)  Medications Ordered in UC Medications - No data to display  Initial Impression / Assessment and Plan / UC Course  I have reviewed the triage vital signs and the nursing notes.  Pertinent labs & imaging results that were available during my care of the patient were reviewed by me and considered in my medical decision making (see chart for details).     Patient is well-appearing, afebrile, nontoxic, nontachycardic.  UA was obtained that showed blood and trace leuks but I suspect this is more related to contamination as she does not currently have any UTI symptoms.  Will send this for culture but defer antibiotics until culture results are available.  Given reproducible pain with associated bony tenderness x-ray of lumbar spine was obtained that showed likely spondylolisthesis which could be contributing to her pain.  She denies any alarm symptoms that warrant emergent evaluation or surgical intervention so was encouraged to follow-up as soon as possible with orthopedics.  She was given the contact information for  local provider with instruction call to schedule appointment.  She was encouraged to use over-the-counter analgesic such as acetaminophen/Tylenol for pain relief.  She was given baclofen and we discussed that this can be sedating and she is not to drive or drink alcohol taking it.  She was also given lidocaine patches with instruction how to appropriately use this medicine.  We discussed that if anything worsens or changes and she has bowel/bladder incontinence, lower extremity weakness, saddle anesthesia, increasing pain she needs to go to the ER.  Strict return precautions given.  All questions were  answered to patient satisfaction.  Final Clinical Impressions(s) / UC Diagnoses   Final diagnoses:  Lumbar back pain  Abnormal urinalysis  Spondylolisthesis of lumbar region     Discharge Instructions      As we discussed, I am concerned that one of the vertebrae in your back is shifted forward.  This can be related to arthritis or could be contributing to your pain.  Please follow-up with orthopedics as you likely need physical therapy and/or an MRI.  Start baclofen for pain.  This will make you sleepy so do not drive drink alcohol with taking it.  You can use Tylenol for additional pain relief.  Apply Lidoderm patch during the day and then remove this at night; use only 1 patch per 24 hours.  Use heat and gentle stretch for symptom relief.  If anything worsens you have increasing pain, difficulty walking, numbness or tingling in your legs, bowel/bladder incontinence you need to go to the emergency room immediately.     ED Prescriptions     Medication Sig Dispense Auth. Provider   baclofen (LIORESAL) 10 MG tablet Take 1 tablet (10 mg total) by mouth 2 (two) times daily as needed for muscle spasms. 30 each Etha Stambaugh K, PA-C   lidocaine (LIDODERM) 5 % Place 1 patch onto the skin daily. Remove & Discard patch within 12 hours or as directed by MD 30 patch Altha Sweitzer K, PA-C      PDMP not reviewed this encounter.   Jeani Hawking, PA-C 11/08/23 1453

## 2023-11-08 NOTE — Discharge Instructions (Signed)
As we discussed, I am concerned that one of the vertebrae in your back is shifted forward.  This can be related to arthritis or could be contributing to your pain.  Please follow-up with orthopedics as you likely need physical therapy and/or an MRI.  Start baclofen for pain.  This will make you sleepy so do not drive drink alcohol with taking it.  You can use Tylenol for additional pain relief.  Apply Lidoderm patch during the day and then remove this at night; use only 1 patch per 24 hours.  Use heat and gentle stretch for symptom relief.  If anything worsens you have increasing pain, difficulty walking, numbness or tingling in your legs, bowel/bladder incontinence you need to go to the emergency room immediately.

## 2023-11-08 NOTE — ED Triage Notes (Signed)
States she woke up with pain over left flank, progressively getting worse today. States she went to water aerobics and the pain got worse.

## 2023-11-10 LAB — URINE CULTURE: Culture: 20000 — AB

## 2023-11-11 ENCOUNTER — Ambulatory Visit (INDEPENDENT_AMBULATORY_CARE_PROVIDER_SITE_OTHER): Payer: 59 | Admitting: Licensed Clinical Social Worker

## 2023-11-11 DIAGNOSIS — F331 Major depressive disorder, recurrent, moderate: Secondary | ICD-10-CM | POA: Diagnosis not present

## 2023-11-11 DIAGNOSIS — F4310 Post-traumatic stress disorder, unspecified: Secondary | ICD-10-CM

## 2023-11-11 DIAGNOSIS — F411 Generalized anxiety disorder: Secondary | ICD-10-CM

## 2023-11-11 NOTE — Progress Notes (Signed)
 Virtual Visit via Video Note   I connected with Meghan [name] on 11/11/23 at 2:00pm by video enabled telemedicine application and verified that I am speaking with the correct person using two identifiers.   I discussed the limitations, risks, security and privacy concerns of performing an evaluation and management service by video and the availability of in person appointments. I also discussed with the patient that there may be a patient responsible charge related to this service. The patient expressed understanding and agreed to proceed.   I discussed the assessment and treatment plan with the patient. The patient was provided an opportunity to ask questions and all were answered. The patient agreed with the plan and demonstrated an understanding of the instructions.   The patient was advised to call back or seek an in-person evaluation if the symptoms worsen or if the condition fails to improve as anticipated.   I provided 53 minutes of non-face-to-face time during this encounter.   Noralee Stain, LCSW, LCAS _______________________ THERAPIST PROGRESS NOTE   Session Time:  2:00pm - 2:53pm   Location: Patient: Patient Home Provider: [facility]     Participation Level: Active    Behavioral Response: Alert, casually dressed, euthymic mood/affect   Type of Therapy:  Individual Therapy   Treatment Goals addressed: Mood management; Medication management; Maintaining healthy boundaries with family   Progress Towards Goals: Progressing   Interventions: CBT, psychoeducation on boundaries, supportive therapy    Summary: Meghan [name] is a 58 year old divorced African American female that presented for therapy appointment today with diagnoses of Major Depressive Disorder, recurrent, moderate, Generalized Anxiety Disorder and PTSD.      Suicidal/Homicidal: None; without plan or intent                                                                                                                 Therapist Response:  Clinician met with Meghan [name] for virtual therapy session and assessed for safety, sobriety, and medication compliance.  Meghan [name] presented for appointment on time and was alert, oriented x5, with no evidence or self-report of active SI/HI or A/V H.  Meghan [name] reported ongoing compliance with medication and denied any use of alcohol or illicit substances.  Clinician inquired about Meghan [name]'s emotional ratings today, as well as any significant changes in thoughts, feelings, or behavior since last check-in.  Meghan [name] reported scores of 0/10 for depression, 0/10 for anxiety, and 0/10 for anger/irritability.  Meghan [name] denied any recent panic attacks or outbursts.  [place] reported that a recent success has been having a relatively easy healing process since having teeth removed.  Meghan [name] reported that a struggle was having a difficult conversation with family over the phone, stating "We had a 3 hour call with family, and I wanted to clear some things up".  Meghan [name] reported that her agenda was to unite the children and offer a space to air any grievances present.  Meghan [name] reported that she felt ambushed in some ways, as there were claims Meghan [name] treats family members differently,  including gifts provided to the grandchildren.  Meghan [name] stated "My youngest daughter apparently had a problem with me.  I got everything out".  Clinician reviewed material on subject of boundaries with Meghan [name] today using a handout.  This handout defined boundaries as the limits and rules that we set for ourselves within relationships, and featured a breakdown of the 3 common categories of boundaries (i.e. porous, rigid, and healthy), along with typical traits specific to each one for easy identification.  It was noted that most people have a mixture of different boundary types depending on setting, person, and culture.  Clinician tasked Meghan [name] with identifying which types of boundaries she presently has within her own support system, the collective impact  these boundaries have upon her mental health, and changes that could be made in order to more effectively communicate her mental health needs.  Intervention was effective, as evidenced by Meghan [name] actively participating in discussion on subject and reporting that this helped her realize that she may have been giving too much of her time and energy to family members that are not reciprocating equally, so more rigid boundaries with one daughter may be warranted.  Meghan [name] reported that she has always been a caretaker to numerous family members in her network, and with recent health challenges, she will plan to give her own needs more consideration to avoid neglect.  Meghan [name] stated "I was exhausted after that conversation, but I also felt lighter.  I was ready to cut ties with her and just focus on the grandchildren".  Clinician will continue to monitor.          Plan: Follow up again in 1-2 weeks.    Diagnosis: Major depressive disorder, recurrent, moderate; Generalized Anxiety Disorder; and PTSD   Collaboration of Care:   No collaboration of care required for this visit.                                                   Patient/Guardian was advised Release of Information must be obtained prior to any record release in order to collaborate their care with an outside provider. Patient/Guardian was advised if they have not already done so to contact the registration department to sign all necessary forms in order for [name] to release information regarding their care.    Consent: Patient/Guardian gives verbal consent for treatment and assignment of benefits for services provided during this visit. Patient/Guardian expressed understanding and agreed to proceed.  Noralee Stain, LCSW, LCAS 11/11/23

## 2023-11-18 ENCOUNTER — Ambulatory Visit (INDEPENDENT_AMBULATORY_CARE_PROVIDER_SITE_OTHER): Payer: 59 | Admitting: Licensed Clinical Social Worker

## 2023-11-18 DIAGNOSIS — F4310 Post-traumatic stress disorder, unspecified: Secondary | ICD-10-CM

## 2023-11-18 DIAGNOSIS — F331 Major depressive disorder, recurrent, moderate: Secondary | ICD-10-CM

## 2023-11-18 DIAGNOSIS — F411 Generalized anxiety disorder: Secondary | ICD-10-CM

## 2023-11-18 NOTE — Progress Notes (Signed)
 Virtual Visit via Video Note   I connected with Caprisha S. Brandes on 11/18/23 at 2:00pm by video enabled telemedicine application and verified that I am speaking with the correct person using two identifiers.   I discussed the limitations, risks, security and privacy concerns of performing an evaluation and management service by video and the availability of in person appointments. I also discussed with the patient that there may be a patient responsible charge related to this service. The patient expressed understanding and agreed to proceed.   I discussed the assessment and treatment plan with the patient. The patient was provided an opportunity to ask questions and all were answered. The patient agreed with the plan and demonstrated an understanding of the instructions.   The patient was advised to call back or seek an in-person evaluation if the symptoms worsen or if the condition fails to improve as anticipated.   I provided 49 minutes of non-face-to-face time during this encounter.   Noralee Stain, LCSW, LCAS _______________________ THERAPIST PROGRESS NOTE   Session Time:  2:00pm - 2:49pm    Location: Patient: Patient Home Provider: [facility]     Participation Level: Active    Behavioral Response: Alert, casually dressed, euthymic mood/affect   Type of Therapy:  Individual Therapy   Treatment Goals addressed: Mood management; Medication management; Maintaining healthy boundaries   Progress Towards Goals: Progressing   Interventions: CBT, anger management    Summary: Jersey S. Elford is a 58 year old divorced African American female that presented for therapy appointment today with diagnoses of Major Depressive Disorder, recurrent, moderate, Generalized Anxiety Disorder and PTSD.      Suicidal/Homicidal: None; without plan or intent                                                                                                                Therapist Response:  Clinician met with  Danity for virtual therapy appointment and assessed for safety, sobriety, and medication compliance.  Charlane presented for session on time and was alert, oriented x5, with no evidence or self-report of active SI/HI or A/V H.  Kathye reported ongoing compliance with medication and denied any use of alcohol or illicit substances.  Clinician inquired about Mirela's current emotional ratings, as well as any significant changes in thoughts, feelings, or behavior since previous check-in.  Arlet reported scores of 0/10 for depression, 0/10 for anxiety, and 0/10 for anger/irritability.  Latiesha denied any recent panic attacks .  Aishia reported that a recent struggle was having an outburst at church the other day, stating "One of my friends said she never sees me lose it like that".  Clinician utilized an Magazine features editor with [place] in session to assist.  This handout explained how anger tends to be an emotion that is easily identifiable due to outward behavior such as frequent emotional outbursts, but can conceal other difficult emotions under the surface if compartmentalization is used.  It was also noted that triggering people, places, and things can influence this emotion, as  well as potential outbursts.  A list of common emotions linked to anger was provided, and Aron was tasked with identifying which ones could be having a present influence, in addition to related triggers.  Clinician also encouraged Yaslene to consider self-care activities which could offer an appropriate stress outlet, and explored the subject with a list of various hobbies that could be helpful for Yalonda to include in routine.  Intervention was effective, as evidenced by Timiya actively engaging in discussion on subject, reporting that there were several external and internal present during this event, including the fact that she was in pain, feeling tired, and struggling to focus on the pastor since there was someone sitting behind her that was  interrupting, being rude, and disrespectful.  Sennie reported that she tried to be assertive and ask this person to leave her alone, but they persisted, and this led her to become increasingly frustrated, helpless, and stressed.  Cachet reported that if she were to face a similar situation, she would have likely stayed home since she wasn't feeling physically or mentally well, and set firmer boundaries with other members of the congregation that may intentionally try to upset her. Lulabelle stated "I love my church, they are wonderful.  There are just a few people that really annoy me".  Clinician will continue to monitor.          Plan: Follow up again in 1-2 weeks.    Diagnosis: Major depressive disorder, recurrent, moderate; Generalized Anxiety Disorder; and PTSD   Collaboration of Care:   No collaboration of care required for this visit.                                                   Patient/Guardian was advised Release of Information must be obtained prior to any record release in order to collaborate their care with an outside provider. Patient/Guardian was advised if they have not already done so to contact the registration department to sign all necessary forms in order for [name] to release information regarding their care.    Consent: Patient/Guardian gives verbal consent for treatment and assignment of benefits for services provided during this visit. Patient/Guardian expressed understanding and agreed to proceed.  Noralee Stain, LCSW, LCAS 11/18/23

## 2023-11-25 ENCOUNTER — Ambulatory Visit (INDEPENDENT_AMBULATORY_CARE_PROVIDER_SITE_OTHER): Payer: 59 | Admitting: Licensed Clinical Social Worker

## 2023-11-25 DIAGNOSIS — F4310 Post-traumatic stress disorder, unspecified: Secondary | ICD-10-CM

## 2023-11-25 DIAGNOSIS — F411 Generalized anxiety disorder: Secondary | ICD-10-CM | POA: Diagnosis not present

## 2023-11-25 DIAGNOSIS — F331 Major depressive disorder, recurrent, moderate: Secondary | ICD-10-CM

## 2023-11-25 NOTE — Progress Notes (Signed)
 Virtual Visit via Video Note   I connected with Meghan [name] on 11/25/23 at 2:00pm by video enabled telemedicine application and verified that I am speaking with the correct person using two identifiers.   I discussed the limitations, risks, security and privacy concerns of performing an evaluation and management service by video and the availability of in person appointments. I also discussed with the patient that there may be a patient responsible charge related to this service. The patient expressed understanding and agreed to proceed.   I discussed the assessment and treatment plan with the patient. The patient was provided an opportunity to ask questions and all were answered. The patient agreed with the plan and demonstrated an understanding of the instructions.   The patient was advised to call back or seek an in-person evaluation if the symptoms worsen or if the condition fails to improve as anticipated.   I provided 31 minutes of non-face-to-face time during this encounter.   Noralee Stain, LCSW, LCAS _______________________ THERAPIST PROGRESS NOTE   Session Time:  2:00pm - 2:31pm   Location: Patient: Patient Home Provider: [facility]     Participation Level: Active    Behavioral Response: Alert, casually dressed, euthymic mood/affect   Type of Therapy:  Individual Therapy   Treatment Goals addressed: Mood management; Medication management  Progress Towards Goals: Progressing   Interventions: CBT, problem solving, hierarchy of needs      Summary: Meghan [name] is a 58 year old divorced African American female that presented for therapy appointment today with diagnoses of Major Depressive Disorder, recurrent, moderate, Generalized Anxiety Disorder and PTSD.      Suicidal/Homicidal: None; without plan or intent                                                                                                                Therapist Response:  Clinician met with Meghan [name] for  virtual therapy session and assessed for safety, sobriety, and medication compliance.  Meghan [name] presented for appointment on time and was alert, oriented x5, with no evidence or self-report of active SI/HI or A/V H.  Meghan [name] reported ongoing compliance with medication and denied any use of alcohol or illicit substances.  Clinician inquired about Meghan [name]'s emotional ratings today, as well as any significant changes in thoughts, feelings, or behavior since last check-in.  [name] reported scores of 0/10 for depression, 0/10 for anxiety, and 0/10 for anger/irritability.  Meghan [name] denied any recent panic attacks or outbursts.  Meghan [name] reported that a recent success has been trying to assist her daughter, who plans to move to [facility] in June to be closer along with the grandchild.  Meghan [name] reported that she is also staying busy working overtime at her job to prepare for a reunion trip in [place], and she was noticeably tired in session.  Clinician utilized a handout on Hierarchy of Needs as topic for discussion today.  This handout explained that when a person's basic needs (physiological and safety) are not met, they cannot tend to higher ones such  as psychological (social and esteem) or fulfillment Engineering geologist).  Clinician tasked Meghan [name] with considering which needs are presently being met, as well as those that need to be addressed in over to improve overall mental and physical wellbeing over course of treatment.  Clinician explored strategies with Meghan [name] for resolving any unmet needs.  Intervention was effective, as evidenced by Meghan [name] actively engaging in discussion on subject, and identifying several needs to address at present.  Meghan [name] reported that the majority of basic needs are met, including shelter, food, and finances, but she would benefit from being more consistent with her medical appointments to ensure optimal physical health.  She reported that in the psychological domain, she has several strong friendships through her church  for support, but needs to maintain boundaries appropriately with family that have historically been a stressor.  Meghan [name] reported that she believes overall 'esteem' would be improved if she returns to school and pursues a Child psychotherapist degree, so she will begin taking steps to look into programs that won't be too time or financially demanding.  Meghan [name] reported that she believes this will help her reach her full potential eventually.  Meghan [name] asked to end session early, as transportation had arrived at her home for next appointment.  Clinician will continue to monitor.          Plan: Follow up again in 1-2 weeks.    Diagnosis: Major depressive disorder, recurrent, moderate; Generalized Anxiety Disorder; and PTSD   Collaboration of Care:   No collaboration of care required for this visit.                                                   Patient/Guardian was advised Release of Information must be obtained prior to any record release in order to collaborate their care with an outside provider. Patient/Guardian was advised if they have not already done so to contact the registration department to sign all necessary forms in order for [name] to release information regarding their care.    Consent: Patient/Guardian gives verbal consent for treatment and assignment of benefits for services provided during this visit. Patient/Guardian expressed understanding and agreed to proceed.  Noralee Stain, LCSW, LCAS 11/25/23

## 2023-11-27 ENCOUNTER — Ambulatory Visit
Admission: RE | Admit: 2023-11-27 | Discharge: 2023-11-27 | Disposition: A | Discharge status: Home / Self Care | Payer: 59 | Source: Ambulatory Visit | Attending: Podiatry

## 2023-11-27 DIAGNOSIS — T85848A Pain due to other internal prosthetic devices, implants and grafts, initial encounter: Secondary | ICD-10-CM

## 2023-12-02 ENCOUNTER — Ambulatory Visit (INDEPENDENT_AMBULATORY_CARE_PROVIDER_SITE_OTHER): Payer: 59 | Admitting: Licensed Clinical Social Worker

## 2023-12-02 ENCOUNTER — Encounter (HOSPITAL_COMMUNITY): Payer: Self-pay

## 2023-12-02 DIAGNOSIS — F4310 Post-traumatic stress disorder, unspecified: Secondary | ICD-10-CM

## 2023-12-02 DIAGNOSIS — F331 Major depressive disorder, recurrent, moderate: Secondary | ICD-10-CM

## 2023-12-02 DIAGNOSIS — F411 Generalized anxiety disorder: Secondary | ICD-10-CM

## 2023-12-02 NOTE — Progress Notes (Signed)
 Meghan [name] had a virtual therapy appointment scheduled today for 2pm.  Meghan [name] presented for session at 2:08pm and reported that she was currently being driven to the dentist, as she had forgotten about an appointment previously scheduled with them today.  Meghan [name] reported that she could not attend virtual session due to inability to ensure confidentiality with the medical transport driver, and another passenger sharing the same vehicle.  Clinician expressed understanding, but informed her there would be a no-show charge due to last minute cancellation.  Meghan [name] reported that she would plan to follow up in 1-2 weeks.  Clinician informed front desk staff of no-show event today.    Noralee Stain, [place], LCAS 12/02/23

## 2023-12-09 ENCOUNTER — Encounter (HOSPITAL_COMMUNITY): Payer: Self-pay

## 2023-12-09 ENCOUNTER — Ambulatory Visit (HOSPITAL_COMMUNITY): Payer: 59 | Admitting: Licensed Clinical Social Worker

## 2023-12-16 ENCOUNTER — Ambulatory Visit (INDEPENDENT_AMBULATORY_CARE_PROVIDER_SITE_OTHER): Payer: 59 | Admitting: Licensed Clinical Social Worker

## 2023-12-16 DIAGNOSIS — F331 Major depressive disorder, recurrent, moderate: Secondary | ICD-10-CM

## 2023-12-16 DIAGNOSIS — F4310 Post-traumatic stress disorder, unspecified: Secondary | ICD-10-CM | POA: Diagnosis not present

## 2023-12-16 DIAGNOSIS — F411 Generalized anxiety disorder: Secondary | ICD-10-CM | POA: Diagnosis not present

## 2023-12-16 NOTE — Progress Notes (Signed)
 Virtual Visit via Video Note   I connected with Meghan [name] on 12/16/23 at 2:00pm by video enabled telemedicine application and verified that I am speaking with the correct person using two identifiers.   I discussed the limitations, risks, security and privacy concerns of performing an evaluation and management service by video and the availability of in person appointments. I also discussed with the patient that there may be a patient responsible charge related to this service. The patient expressed understanding and agreed to proceed.   I discussed the assessment and treatment plan with the patient. The patient was provided an opportunity to ask questions and all were answered. The patient agreed with the plan and demonstrated an understanding of the instructions.   The patient was advised to call back or seek an in-person evaluation if the symptoms worsen or if the condition fails to improve as anticipated.   I provided 51 minutes of non-face-to-face time during this encounter.   Noralee Stain, LCSW, LCAS _______________________ THERAPIST PROGRESS NOTE   Session Time:  2:00pm - 2:51pm  Location: Patient: Patient Home Provider: Home Office     Participation Level: Active    Behavioral Response: Alert, casually dressed, euthymic mood/affect   Type of Therapy:  Individual Therapy   Treatment Goals addressed: Mood management; Medication management; Managing boundaries with support network  Progress Towards Goals: Progressing   Interventions: CBT, problem solving, communication skills    Summary: Meghan [name] is a 58 year old divorced African American female that presented for therapy appointment today with diagnoses of Major Depressive Disorder, recurrent, moderate, Generalized Anxiety Disorder and PTSD.      Suicidal/Homicidal: None; without plan or intent                                                                                                                Therapist  Response:  Clinician met with Meghan [name] for virtual therapy appointment and assessed for safety, sobriety, and medication compliance.  Meghan [name] presented for session on time and was alert, oriented x5, with no evidence or self-report of active SI/HI or A/V H.  Meghan [name] reported ongoing compliance with medication and denied any use of alcohol or illicit substances.  Clinician inquired about Meghan [name]'s current emotional ratings, as well as any significant changes in thoughts, feelings, or behavior since previous check-in.  Meghan [name] reported scores of 0/10 for depression, 0/10 for anxiety, and 0/10 for anger/irritability.  Meghan [name] denied any recent panic attacks or outbursts.  Meghan [name] reported that a recent success was going to church yesterday and hearing a sermon that moved her, stating "The pastor told me some things I needed to hear".  Meghan [name] reported that a struggle was talking to her son over the past week and learning that he wants to move closer to her, which she is apprehensive about.  Clinician assisted Meghan [name] in running a cost benefit analysis regarding how this move would impact her overall wellbeing, including mental health and finances.  Meghan [name] participated in analysis and reported that it would be  costly in a few ways, since her son is having marital and job problems, and has historically tried to take advantage of her before when he is struggling.  Clinician reviewed topic of communication skills with Meghan [name] to assist.  Clinician encouraged Meghan [name] to utilize an assertive approach to express her thoughts and feelings in a calm manner, and set realistic boundaries with family members that could be triggers.  Intervention was effective, as Meghan [name] reported receptiveness to this information and felt that it would be beneficial to readjust boundaries with a number of her children at this time, including her daughter that has been disrespectful toward her about perceived differential treatment among siblings.  Meghan [name] reported that she  would plan to talk to her son this week, and prioritize self-care activities to balance stress.  Clinician will continue to monitor.          Plan: Follow up again in 1-2 weeks.    Diagnosis: Major depressive disorder, recurrent, moderate; Generalized Anxiety Disorder; and PTSD   Collaboration of Care:   No collaboration of care required for this visit.                                                   Patient/Guardian was advised Release of Information must be obtained prior to any record release in order to collaborate their care with an outside provider. Patient/Guardian was advised if they have not already done so to contact the registration department to sign all necessary forms in order for [name] to release information regarding their care.    Consent: Patient/Guardian gives verbal consent for treatment and assignment of benefits for services provided during this visit. Patient/Guardian expressed understanding and agreed to proceed.  Noralee Stain, [place], LCAS 12/16/23

## 2023-12-23 ENCOUNTER — Ambulatory Visit (INDEPENDENT_AMBULATORY_CARE_PROVIDER_SITE_OTHER): Payer: 59 | Admitting: Licensed Clinical Social Worker

## 2023-12-23 DIAGNOSIS — F411 Generalized anxiety disorder: Secondary | ICD-10-CM | POA: Diagnosis not present

## 2023-12-23 DIAGNOSIS — F4310 Post-traumatic stress disorder, unspecified: Secondary | ICD-10-CM | POA: Diagnosis not present

## 2023-12-23 DIAGNOSIS — F331 Major depressive disorder, recurrent, moderate: Secondary | ICD-10-CM | POA: Diagnosis not present

## 2023-12-23 NOTE — Progress Notes (Signed)
 Virtual Visit via Video Note   I connected with Meghan [name] on 12/23/23 at 2:00pm by video enabled telemedicine application and verified that I am speaking with the correct person using two identifiers.   I discussed the limitations, risks, security and privacy concerns of performing an evaluation and management service by video and the availability of in person appointments. I also discussed with the patient that there may be a patient responsible charge related to this service. The patient expressed understanding and agreed to proceed.   I discussed the assessment and treatment plan with the patient. The patient was provided an opportunity to ask questions and all were answered. The patient agreed with the plan and demonstrated an understanding of the instructions.   The patient was advised to call back or seek an in-person evaluation if the symptoms worsen or if the condition fails to improve as anticipated.   I provided 38 minutes of non-face-to-face time during this encounter.   Meghan Stain, LCSW, LCAS _______________________ THERAPIST PROGRESS NOTE   Session Time:  2:00pm - 2:38pm   Location: Patient: Patient Home Provider: Home Office     Participation Level: Active    Behavioral Response: Alert, casually dressed, anxious mood/affect   Type of Therapy:  Individual Therapy   Treatment Goals addressed: Mood management; Medication management; Managing boundaries with support network  Progress Towards Goals: Progressing   Interventions: CBT, psychoeducation on dementia, supportive therapy    Summary: Meghan [name] is a 58 year old divorced African American female that presented for therapy appointment today with diagnoses of Major Depressive Disorder, recurrent, moderate, Generalized Anxiety Disorder and PTSD.      Suicidal/Homicidal: None; without plan or intent                                                                                                                 Therapist Response:  Clinician met with Meghan [name] for virtual therapy session and assessed for safety, sobriety, and medication compliance.  Meghan [name] presented for appointment on time and was alert, oriented x5, with no evidence or self-report of active SI/HI or A/V H.  Meghan [name] reported ongoing compliance with medication and denied any use of alcohol or illicit substances.  Clinician inquired about Meghan [name]'s emotional ratings today, as well as any significant changes in thoughts, feelings, or behavior since last check-in.  Meghan [name] reported scores of 0/10 for depression, 3/10 for anxiety, and 0/10 for anger/irritability.  Meghan [name] denied any recent panic attacks or outbursts.  Meghan [name] reported that a recent struggle has been worrying about her aunt, who had a fall in her home, and was diagnosed with dementia.  Meghan [name] reported that her aunt lives in [place] and she has been trying to figure out how to support her during this crisis. Clinician utilized a handout on the subject with Meghan [name] to assist.  This handout offered strategies for helping a family member struggling with dementia while maintaining healthy boundaries to protect oneself.  This included discussing the different types of dementia, and progression of symptoms  to better familiarize herself with the condition, and effective forms of treatment.  Some of the suggestions offered included referring the individual for talk or rehabilitation therapy if warranted, or assisting them with scheduling/attending medical appointments and medication compliance.  Support for caregivers was also discussed, including attendance to support groups to socialize with like minded peers, prioritizing her own wellness appointments, and ensuring adequate time for outside socialization and self-care.  Clinician inquired about changes Meghan [name] could make to her routine to avoid triggering decline of her own mental or physical wellbeing while taking on this challenge.  Intervention was effective, as  evidenced by Meghan [name]'s active engagement in discussion on subject, and receptiveness to information provided.  Meghan [name] reported that she plans to call her aunt regularly at the hospital to check in, offer encouragement, and link with family weekly to get updates and coordinate care.  She reported that she will also try to take off time when possible to visit her in person in [place] when financially able, so that she doesn't have regrets if she passes.  Meghan [name] stated "I think I needed to get all of this off my chest".  Clinician will continue to monitor.          Plan: Follow up again in 1-2 weeks.    Diagnosis: Major depressive disorder, recurrent, moderate; Generalized Anxiety Disorder; and PTSD   Collaboration of Care:   No collaboration of care required for this visit.                                                   Patient/Guardian was advised Release of Information must be obtained prior to any record release in order to collaborate their care with an outside provider. Patient/Guardian was advised if they have not already done so to contact the registration department to sign all necessary forms in order for [name] to release information regarding their care.    Consent: Patient/Guardian gives verbal consent for treatment and assignment of benefits for services provided during this visit. Patient/Guardian expressed understanding and agreed to proceed.  Meghan Stain, LCSW, LCAS 12/23/23

## 2023-12-24 ENCOUNTER — Ambulatory Visit: Payer: Self-pay

## 2023-12-24 ENCOUNTER — Ambulatory Visit: Admitting: Internal Medicine

## 2023-12-24 NOTE — Progress Notes (Unsigned)
 OV 12/24/2023  Subjective:  Patient ID: Meghan [name], female , DOB: Sep 29, 1965 , age 58 y.o. , MRN: [id number] , ADDRESS: [address] [place] [address] PCP Felix Pacini, FNP Patient Care Team: Felix Pacini, FNP as PCP - General (Endocrinology) Maisie Fus, MD as PCP - Cardiology (Cardiology) Mealor, Roberts Gaudy, MD as PCP - Electrophysiology (Cardiology) Center, [facility], Olga Coaster, MD as Referring Physician (Cardiology) Jackie Plum, MD as Consulting Physician (Internal Medicine) Karie Soda, MD as Consulting Physician (General Surgery) Jeani Hawking, MD as Consulting Physician (Gastroenterology) Griffin Dakin, MD as Referring Physician (Orthopedic Surgery) Felix Pacini, FNP as Nurse Practitioner (Family Medicine) Levan Hurst Anastasia Fiedler, FNP as Referring Physician (Endocrinology) Levan Hurst Anastasia Fiedler, FNP as Referring Physician (Endocrinology)  This Provider for this visit: Treatment Team:  Attending Provider: Kalman Shan, MD    12/24/2023 -  No chief complaint on file.    HPI Meghan [name] 58 y.o. -    CT Chest data from date: ****  - personally visualized and independently interpreted : *** - my findings are: ***   PFT      No data to display             LAB RESULTS last 96 hours No results found.       has a past medical history of (HFpEF) heart failure with preserved ejection fraction (HCC) (01/2019), Anxiety, Asthma, Atrial fibrillation and flutter (HCC) (02/16/2019), Class 3 severe obesity due to excess calories with body mass index (BMI) of 50.0 to 59.9 in adult [name]), DDD (degenerative disc disease), lumbar, GERD (gastroesophageal reflux disease), Hiatal hernia, History of head injury, HTN (hypertension), Hypercholesterolemia, Ileus (HCC), Intractable nausea and vomiting (11/16/2020), Long term current use of anticoagulant, Macular degeneration of left eye,  MDD (major depressive disorder), OA (osteoarthritis), OSA on CPAP, Pneumonia, Presence of IVC filter, PTSD (post-traumatic stress disorder), Recurrent pulmonary emboli (HCC), Seizure disorder (HCC), Stroke (HCC), T2DM (type 2 diabetes mellitus) (HCC), and Vitamin D deficiency.   reports that she has never smoked. She has never used smokeless tobacco.  Past Surgical History:  Procedure Laterality Date   A-FLUTTER ABLATION N/A 11/02/2022   Procedure: A-FLUTTER ABLATION;  Surgeon: Mealor, Roberts Gaudy, MD;  Location: [facility];  Service: Cardiovascular;  Laterality: N/A;   ARTHRODESIS METATARSALPHALANGEAL JOINT (MTPJ) Left 02/25/2023   Procedure: ARTHRODESIS METATARSALPHALANGEAL JOINT (MTPJ);  Surgeon: Candelaria Stagers, DPM;  Location: [facility] ORS;  Service: Podiatry;  Laterality: Left;   CARDIAC CATHETERIZATION  2017   in [facility] in [place]   CARDIOVERSION N/A 08/22/2022   Procedure: CARDIOVERSION;  Surgeon: Thomasene Ripple, DO;  Location: [facility];  Service: Cardiovascular;  Laterality: N/A;   CESAREAN SECTION N/A 1989   CESAREAN SECTION N/A 1993   CHOLECYSTECTOMY  2003   COLONOSCOPY WITH PROPOFOL N/A 06/05/2019   Procedure: COLONOSCOPY WITH PROPOFOL;  Surgeon: Jeani Hawking, MD;  Location: [facility];  Service: Endoscopy;  Laterality: N/A;   CYST REMOVAL WITH BONE GRAFT Left 10/18/2020   Procedure: BONE GRAFTING OF ENCHONDROMA MIDDLE PHANLANX OF LEFT MIDDLE FINGER;  Surgeon: Cindee Salt, MD;  Location: [facility] OR;  Service: Orthopedics;  Laterality: Left;  AXILLARY BLOCK   DIAGNOSTIC LAPAROSCOPY  2015; 2017   lap hernia repair x2   ENDOVENOUS ABLATION SAPHENOUS VEIN W/ LASER Left 08/31/2021   endovenous laser ablation left greater saphenous vein and stab phlebectomy 10-20 incisions left leg by Lemar Livings MD  HALLUX FUSION Right 05/28/2022   Procedure: HALLUX FUSION METATARSAL PHALANGEAL JOIT;  Surgeon: Candelaria Stagers, DPM;  Location: [facility];  Service:  Podiatry;  Laterality: Right;  BLOCK   HERNIA REPAIR  2014, 2018   umbilical hernia repair   IVC FILTER INSERTION  2003   POLYPECTOMY  06/05/2019   Procedure: POLYPECTOMY;  Surgeon: Jeani Hawking, MD;  Location: [facility];  Service: Endoscopy;;   SHOULDER ARTHROSCOPY W/ ROTATOR CUFF REPAIR Right 08/28/2017   TOTAL ABDOMINAL HYSTERECTOMY  2003   UPPER GI ENDOSCOPY     WRIST ARTHROSCOPY WITH DEBRIDEMENT Left 10/18/2020   Procedure: LEFT WRIST ARTHROSCOPY WITH DEBRIDEMENT;  Surgeon: Cindee Salt, MD;  Location: [facility];  Service: Orthopedics;  Laterality: Left;  AXILLARY BLOCK   WRIST ARTHROSCOPY WITH DEBRIDEMENT Right 01/25/2022   Procedure: ARTHROSCOPY RIGHT WRIST WITH  DEBRIDEMENT/ SHRINKAGE;  Surgeon: Betha Loa, MD;  Location: [facility];  Service: Orthopedics;  Laterality: Right;    Allergies  Allergen Reactions   Acetaminophen-Codeine Swelling, Rash and Other (See Comments)    Tylenol with Codeine, Tylenol #3,  (facial swelling, hives)   Bee Venom Anaphylaxis   Meloxicam Anaphylaxis and Rash   Tramadol Hives and Other (See Comments)    Patient stated "I was trippin' and I do not want that ever again"   Codeine Swelling    hives   Coconut (Cocos Nucifera) Rash and Other (See Comments)    ANY coconut products    Tomato Rash    Fresh     Immunization History  Administered Date(s) Administered   Influenza Split 05/22/2019, 07/18/2021   Influenza, High Dose Seasonal PF 05/22/2019   Influenza, Seasonal, Injecte, Preservative Fre 06/19/2023   Influenza-Unspecified 05/28/2019   PFIZER(Purple Top)SARS-COV-2 Vaccination 12/18/2019, 01/18/2020    Family History  Problem Relation Age of Onset   Breast cancer Mother    Heart attack Father    Diabetes Father    Congestive Heart Failure Father    Breast cancer Maternal Grandmother      Current Outpatient Medications:    albuterol (VENTOLIN HFA) 108 (90 Base) MCG/ACT inhaler, Inhale 2 puffs into the lungs every 6 (six) hours as  needed for wheezing or shortness of breath., Disp: , Rfl:    baclofen (LIORESAL) 10 MG tablet, Take 1 tablet (10 mg total) by mouth 2 (two) times daily as needed for muscle spasms., Disp: 30 each, Rfl: 0   bumetanide (BUMEX) 1 MG tablet, Take 1 tablet (1 mg total) by mouth daily for 10 days. (Patient taking differently: Take 1 mg by mouth every morning.), Disp: 10 tablet, Rfl: 0   Cholecalciferol (VITAMIN D-3) 125 MCG (5000 UT) TABS, Take 5,000 Units by mouth daily., Disp: , Rfl:    dicyclomine (BENTYL) 20 MG tablet, Take 20 mg by mouth 3 (three) times daily., Disp: , Rfl:    lidocaine (LIDODERM) 5 %, Place 1 patch onto the skin daily. Remove & Discard patch within 12 hours or as directed by MD, Disp: 30 patch, Rfl: 0   lidocaine (XYLOCAINE) 2 % solution, Use as directed 15 mLs in the mouth or throat every 3 (three) hours as needed for mouth pain. Swish/gargle and spit out, Disp: 100 mL, Rfl: 0   metFORMIN (GLUCOPHAGE) 500 MG tablet, Take 500 mg by mouth daily with breakfast., Disp: , Rfl:    MOUNJARO 7.5 MG/0.5ML Pen, Inject 7.5 mg into the skin once a week., Disp: , Rfl:    Omega-3 Fatty Acids (FISH OIL) 1200  MG CAPS, Take 1,200 mg by mouth daily with breakfast., Disp: , Rfl:    omeprazole (PRILOSEC) 40 MG capsule, Take 40 mg by mouth 2 (two) times daily., Disp: , Rfl:    rivaroxaban (XARELTO) 20 MG TABS tablet, Take 20 mg by mouth at bedtime. , Disp: , Rfl:    rizatriptan (MAXALT) 10 MG tablet, Take 10 mg by mouth as needed for migraine., Disp: , Rfl:    rosuvastatin (CRESTOR) 10 MG tablet, Take 10 mg by mouth at bedtime., Disp: , Rfl:    sertraline (ZOLOFT) 50 MG tablet, Take 50 mg by mouth in the morning., Disp: , Rfl:       Objective:   There were no vitals filed for this visit.  Estimated body mass index is 51.25 kg/m as calculated from the following:   Height as of 10/25/23: 5\' 5"  (1.651 m).   Weight as of 10/17/23: 308 lb (139.7 kg).  @WEIGHTCHANGE @  There were no vitals filed for  this visit.   Physical Exam   General: No distress. *** O2 at rest: *** Cane present: *** Sitting in wheel chair: *** Frail: *** Obese: *** Neuro: Alert and Oriented x 3. GCS 15. Speech normal Psych: Pleasant Resp:  Barrel Chest - ***.  Wheeze - ***, Crackles - ***, No overt respiratory distress CVS: Normal heart sounds. Murmurs - *** Ext: Stigmata of Connective Tissue Disease - *** HEENT: Normal upper airway. PEERL +. No post nasal drip        Assessment:     No diagnosis found.     Plan:     There are no Patient Instructions on file for this visit.   FOLLOWUP No follow-ups on file.    SIGNATURE    Dr. Kalman Shan, M.D., F.C.C.P,  Pulmonary and Critical Care Medicine Staff Physician, [facility] Center Director - Interstitial Lung Disease  Program  Pulmonary Fibrosis [facility] at [facility] [place], [place], [address]  Pager: [phone], If no answer or between  15:00h - 7:00h: call [phone] Telephone: [phone]  11:38 AM 12/24/2023   Moderate Complexity MDM OFFICE  2021 E/M guidelines, first released in 2021, with minor revisions added in 2023 and 2024 Must meet the requirements for 2 out of 3 dimensions to qualify.    Number and complexity of problems addressed Amount and/or complexity of data reviewed Risk of complications and/or morbidity  One or more chronic illness with mild exacerbation, OR progression, OR  side effects of treatment  Two or more stable chronic illnesses  One undiagnosed new problem with uncertain prognosis  One acute illness with systemic symptoms   One Acute complicated injury Must meet the requirements for 1 of 3 of the categories)  Category 1: Tests and documents, historian  Any combination of 3 of the following:  Assessment requiring an independent historian  Review of prior external note(s) from each unique source  Review of results of each unique test  Ordering  of each unique test    Category 2: Interpretation of tests   Independent interpretation of a test performed by another physician/other qualified health care professional (not separately reported)  Category 3: Discuss management/tests  Discussion of management or test interpretation with external physician/other qualified health care professional/appropriate source (not separately reported) Moderate risk of morbidity from additional diagnostic testing or treatment Examples only:  Prescription drug management  Decision regarding minor surgery with identfied patient or procedure risk factors  Decision regarding elective major surgery without identified patient or procedure risk factors  Diagnosis or treatment significantly limited by social determinants of health             HIGh Complexity  OFFICE   2021 E/M guidelines, first released in 2021, with minor revisions added in 2023. Must meet the requirements for 2 out of 3 dimensions to qualify.    Number and complexity of problems addressed Amount and/or complexity of data reviewed Risk of complications and/or morbidity  Severe exacerbation of chronic illness  Acute or chronic illnesses that may pose a threat to life or bodily function, e.g., multiple trauma, acute MI, pulmonary embolus, severe respiratory distress, progressive rheumatoid arthritis, psychiatric illness with potential threat to self or others, peritonitis, acute renal failure, abrupt change in neurological status Must meet the requirements for 2 of 3 of the categories)  Category 1: Tests and documents, historian  Any combination of 3 of the following:  Assessment requiring an independent historian  Review of prior external note(s) from each unique source  Review of results of each unique test  Ordering of each unique test    Category 2: Interpretation of tests    Independent interpretation of a test performed by another physician/other qualified  health care professional (not separately reported)  Category 3: Discuss management/tests  Discussion of management or test interpretation with external physician/other qualified health care professional/appropriate source (not separately reported)  HIGH risk of morbidity from additional diagnostic testing or treatment Examples only:  Drug therapy requiring intensive monitoring for toxicity  Decision for elective major surgery with identified pateint or procedure risk factors  Decision regarding hospitalization or escalation of level of care  Decision for DNR or to de-escalate care   Parenteral controlled  substances            [name] - Independent interpretation involves the interpretation of a test for which there is a CPT code, and an interpretation or report is customary. When a review and interpretation of a test is performed and documented by the provider, but not separately reported (billed), then this would represent an independent interpretation. This report does not need to conform to the usual standards of a complete report of the test. This does not include interpretation of tests that do not have formal reports such as a complete blood count with differential and blood cultures. Examples would include reviewing a chest radiograph and documenting in the medical record an interpretation, but not separately reporting (billing) the interpretation of the chest radiograph.   An appropriate source includes professionals who are not health care professionals but may be involved in the management of the patient, such as a Clinical research associate, upper officer, case manager or teacher, and does not include discussion with family or informal caregivers.    - SDOH: SDOH are the conditions in the environments where people are born, live, learn, work, play, worship, and age that affect a wide range of health, functioning, and quality-of-life outcomes and risks. (e.g., housing, food insecurity,  transportation, etc.). SDOH-related Z codes ranging from Z55-Z65 are the ICD-10-CM diagnosis codes used to document SDOH data Z55 - Problems related to education and literacy Z56 - Problems related to employment and unemployment Z57 - Occupational exposure to risk factors Z58 - Problems related to physical environment Z59 - Problems related to housing and economic circumstances [phone] - Problems related to social environment [phone] - Problems related to upbringing [phone] - Other problems related to primary support group, including family circumstances Z13 -  Problems related to certain psychosocial circumstances Z65 - Problems related to other psychosocial circumstances

## 2023-12-25 ENCOUNTER — Encounter: Payer: Self-pay | Admitting: Pulmonary Disease

## 2023-12-25 ENCOUNTER — Ambulatory Visit (INDEPENDENT_AMBULATORY_CARE_PROVIDER_SITE_OTHER): Admitting: Pulmonary Disease

## 2023-12-25 VITALS — BP 118/66 | HR 71 | Temp 97.8°F | Ht 65.0 in | Wt 314.8 lb

## 2023-12-25 DIAGNOSIS — G4733 Obstructive sleep apnea (adult) (pediatric): Secondary | ICD-10-CM | POA: Diagnosis not present

## 2023-12-25 DIAGNOSIS — G4700 Insomnia, unspecified: Secondary | ICD-10-CM | POA: Diagnosis not present

## 2023-12-25 MED ORDER — ZOLPIDEM TARTRATE ER 6.25 MG PO TBCR: 6.2500 mg | EXTENDED_RELEASE_TABLET | Freq: Every day | ORAL | 2 refills | Status: DC

## 2023-12-25 NOTE — Progress Notes (Signed)
 Meghan [name]    [id number]    [birth year]  Primary Care Physician:[place], Anastasia Fiedler, FNP  Referring Physician: Felix Pacini, FNP [address] [place],  [place] [address]  Chief complaint:   Follow-up for obstructive sleep apnea  HPI:  Patient with obstructive sleep apnea, compliant with CPAP use  Feels CPAP not working as well Multiple awakenings, nonrestorative sleep Has no difficulty falling asleep but does wake up early and wakes up multiple times to use the bathroom  She claims compliance with CPAP No significant changes in her health No new medications  She is working on weight loss  She is active, tries to exercise regularly limited by knee problems  She does have a history of generalized anxiety, PTSD but does not feel this is acting up at present  She has been on doxepin in the past - Did not really help her sleeping  Outpatient Encounter Medications as of 12/25/2023  Medication Sig   albuterol (VENTOLIN HFA) 108 (90 Base) MCG/ACT inhaler Inhale 2 puffs into the lungs every 6 (six) hours as needed for wheezing or shortness of breath.   baclofen (LIORESAL) 10 MG tablet Take 1 tablet (10 mg total) by mouth 2 (two) times daily as needed for muscle spasms.   Cholecalciferol (VITAMIN D-3) 125 MCG (5000 UT) TABS Take 5,000 Units by mouth daily.   dicyclomine (BENTYL) 20 MG tablet Take 20 mg by mouth 3 (three) times daily.   lidocaine (LIDODERM) 5 % Place 1 patch onto the skin daily. Remove & Discard patch within 12 hours or as directed by MD   lidocaine (XYLOCAINE) 2 % solution Use as directed 15 mLs in the mouth or throat every 3 (three) hours as needed for mouth pain. Swish/gargle and spit out   metFORMIN (GLUCOPHAGE) 500 MG tablet Take 500 mg by mouth daily with breakfast.   MOUNJARO 7.5 MG/0.5ML Pen Inject 7.5 mg into the skin once a week.   Omega-3 Fatty Acids (FISH OIL) 1200 MG CAPS Take 1,200 mg by mouth daily with breakfast.   omeprazole  (PRILOSEC) 40 MG capsule Take 40 mg by mouth 2 (two) times daily.   rivaroxaban (XARELTO) 20 MG TABS tablet Take 20 mg by mouth at bedtime.    rizatriptan (MAXALT) 10 MG tablet Take 10 mg by mouth as needed for migraine.   rosuvastatin (CRESTOR) 10 MG tablet Take 10 mg by mouth at bedtime.   sertraline (ZOLOFT) 50 MG tablet Take 50 mg by mouth in the morning.   bumetanide (BUMEX) 1 MG tablet Take 1 tablet (1 mg total) by mouth daily for 10 days. (Patient taking differently: Take 1 mg by mouth every morning.)   No facility-administered encounter medications on file as of 12/25/2023.    Allergies as of 12/25/2023 - Review Complete 12/25/2023  Allergen Reaction Noted   Acetaminophen-codeine Swelling, Rash, and Other (See Comments) 06/02/2019   Bee venom Anaphylaxis 05/28/2022   Meloxicam Anaphylaxis and Rash 03/24/2020   Tramadol Hives and Other (See Comments) 10/18/2020   Codeine Swelling 01/11/2021   Coconut (cocos nucifera) Rash and Other (See Comments) 06/02/2019   Tomato Rash 06/02/2019    Past Medical History:  Diagnosis Date   (HFpEF) heart failure with preserved ejection fraction (HCC) 01/2019   a.) Dx'd in [place], [place]; b.) TTE 08/14/2022: EF 60-65%, mod LVH, mild LAE, triv MR   Anxiety    Asthma    Atrial fibrillation and flutter (HCC) 02/16/2019  a.) CHA2DS2VASc = 6 (sex, HFpEF, HTN, CVA x2, T2DM);  b.) s/p DCCV (200 J x 1) 08/22/2022; c.) s/p RF ablation (CTI) 11/02/2022; d.) rate/rhythm maintained on oral flecainide + metoprolol succinate; chronically anticoagulated with rivaroxaban   Class 3 severe obesity due to excess calories with body mass index (BMI) of 50.0 to 59.9 in adult [facility])    DDD (degenerative disc disease), lumbar    GERD (gastroesophageal reflux disease)    Hiatal hernia    History of head injury    HTN (hypertension)    Hypercholesterolemia    Ileus (HCC)    Intractable nausea and vomiting 11/16/2020   Long term current use of anticoagulant    a.)  rivaroxaban   Macular degeneration of left eye    MDD (major depressive disorder)    OA (osteoarthritis)    OSA on CPAP    Pneumonia    Presence of IVC filter    PTSD (post-traumatic stress disorder)    Recurrent pulmonary emboli (HCC)    a.) s/p IVC filter placement 2003; b.) previously followed by hematology when living in [place], [place]   Seizure disorder [facility])    a.) last was in 2020 per pt report   Stroke (HCC)    1989 and 1995 (left sided weakness)   T2DM (type 2 diabetes mellitus) (HCC)    Vitamin D deficiency     Past Surgical History:  Procedure Laterality Date   A-FLUTTER ABLATION N/A 11/02/2022   Procedure: A-FLUTTER ABLATION;  Surgeon: Mealor, Roberts Gaudy, MD;  Location: [facility];  Service: Cardiovascular;  Laterality: N/A;   ARTHRODESIS METATARSALPHALANGEAL JOINT (MTPJ) Left 02/25/2023   Procedure: ARTHRODESIS METATARSALPHALANGEAL JOINT (MTPJ);  Surgeon: Candelaria Stagers, DPM;  Location: [facility] ORS;  Service: Podiatry;  Laterality: Left;   CARDIAC CATHETERIZATION  2017   in [facility] in [place]   CARDIOVERSION N/A 08/22/2022   Procedure: CARDIOVERSION;  Surgeon: Thomasene Ripple, DO;  Location: [facility] ENDOSCOPY;  Service: Cardiovascular;  Laterality: N/A;   CESAREAN SECTION N/A 1989   CESAREAN SECTION N/A 1993   CHOLECYSTECTOMY  2003   COLONOSCOPY WITH PROPOFOL N/A 06/05/2019   Procedure: COLONOSCOPY WITH PROPOFOL;  Surgeon: Jeani Hawking, MD;  Location: [facility] ENDOS[facility];  Service: Endoscopy;  Laterality: N/A;   CYST REMOVAL WITH BONE GRAFT Left 10/18/2020   Procedure: BONE GRAFTING OF ENCHONDROMA MIDDLE PHANLANX OF LEFT MIDDLE FINGER;  Surgeon: Cindee Salt, MD;  Location: [facility] OR;  Service: Orthopedics;  Laterality: Left;  AXILLARY BLOCK   DIAGNOSTIC LAPAROSCOPY  2015; 2017   lap hernia repair x2   ENDOVENOUS ABLATION SAPHENOUS VEIN W/ LASER Left 08/31/2021   endovenous laser ablation left greater saphenous vein and stab phlebectomy 10-20 incisions left leg by  Lemar Livings MD   HALLUX FUSION Right 05/28/2022   Procedure: HALLUX FUSION METATARSAL PHALANGEAL JOIT;  Surgeon: Candelaria Stagers, DPM;  Location: [facility];  Service: Podiatry;  Laterality: Right;  BLOCK   HERNIA REPAIR  2014, 2018   umbilical hernia repair   IVC FILTER INSERTION  2003   POLYPECTOMY  06/05/2019   Procedure: POLYPECTOMY;  Surgeon: Jeani Hawking, MD;  Location: [facility];  Service: Endoscopy;;   SHOULDER ARTHROSCOPY W/ ROTATOR CUFF REPAIR Right 08/28/2017   TOTAL ABDOMINAL HYSTERECTOMY  2003   UPPER GI ENDOSCOPY     WRIST ARTHROSCOPY WITH DEBRIDEMENT Left 10/18/2020   Procedure: LEFT WRIST ARTHROSCOPY WITH DEBRIDEMENT;  Surgeon: Cindee Salt, MD;  Location: [facility] OR;  Service: Orthopedics;  Laterality: Left;  AXILLARY BLOCK   WRIST ARTHROSCOPY WITH DEBRIDEMENT Right 01/25/2022   Procedure: ARTHROSCOPY RIGHT WRIST WITH  DEBRIDEMENT/ SHRINKAGE;  Surgeon: Betha Loa, MD;  Location: [facility] OR;  Service: Orthopedics;  Laterality: Right;    Family History  Problem Relation Age of Onset   Breast cancer Mother    Heart attack Father    Diabetes Father    Congestive Heart Failure Father    Breast cancer Maternal Grandmother     Social History   Socioeconomic History   Marital status: Divorced    Spouse name: Not on file   Number of children: 3   Years of education: Not on file   Highest education level: Not on file  Occupational History   Not on file  Tobacco Use   Smoking status: Never   Smokeless tobacco: Never  Vaping Use   Vaping status: Never Used  Substance and Sexual Activity   Alcohol use: Never   Drug use: Never   Sexual activity: Not Currently    Birth control/protection: Surgical    Comment: Hysterectomy  Other Topics Concern   Not on file  Social History Narrative   ** Merged History Encounter **       Social Drivers of Health   Financial Resource Strain: Not on file  Food Insecurity: No Food Insecurity (11/08/2022)   Hunger  Vital Sign    Worried About Running Out of Food in the Last Year: Never true    Ran Out of Food in the Last Year: Never true  Transportation Needs: No Transportation Needs (11/08/2022)   PRAPARE - Transportation    Lack of Transportation (Medical): No    Lack of Transportation (Non-Medical): No  Physical Activity: Not on file  Stress: Not on file  Social Connections: Unknown (01/30/2022)   Received from [facility]   Social Network    Social Network: Not on file  Intimate Partner Violence: Not At Risk (11/08/2022)   Humiliation, Afraid, Rape, and Kick questionnaire    Fear of Current or Ex-Partner: No    Emotionally Abused: No    Physically Abused: No    Sexually Abused: No    Review of Systems  Constitutional:  Negative for fatigue.  Respiratory:  Negative for apnea and shortness of breath.   Psychiatric/Behavioral:  Negative for sleep disturbance.     Vitals:   12/25/23 0926  BP: 118/66  Pulse: 71  Temp: 97.8 F (36.6 C)  SpO2: 97%     Physical Exam Constitutional:      Appearance: She is obese.  HENT:     Head: Normocephalic.     Nose: Nose normal.     Mouth/Throat:     Mouth: Mucous membranes are moist.  Eyes:     General: No scleral icterus.    Pupils: Pupils are equal, round, and reactive to light.  Cardiovascular:     Rate and Rhythm: Normal rate and regular rhythm.     Heart sounds: No murmur heard.    No friction rub.  Pulmonary:     Effort: No respiratory distress.     Breath sounds: No stridor. No wheezing or rhonchi.  Abdominal:     General: There is no distension.     Palpations: There is no mass.     Tenderness: There is no abdominal tenderness.     Hernia: No hernia is present.  Musculoskeletal:     Cervical back: No tenderness.  Neurological:     Mental  Status: She is alert.  Psychiatric:        Mood and Affect: Mood normal.     Data Reviewed: Download from the machine shows excellent compliance average use of 5 hours  57 minutes CPAP of 13 Residual AHI of 1  Assessment:  History of sleep apnea - CPAP appears to be working well  Nonrestorative sleep with early awakening and inability to go back to sleep Multiple awakenings - Compliance shows adequate compliance with CPAP with optimal treatment of sleep disordered breathing - No environmental issues contributing to awakenings - No significant weight gain - We did discuss possible trial with a sleep aid  Class III obesity - Continues to lose weight  Plan/Recommendations: Trial with Ambien CR  Continue CPAP  I do not believe changing CPAP pressures will help at present as it appears to be optimally treating sleep disordered breathing CPAP is well-tolerated  Graded activities as tolerated  Encouraged continuing weight loss efforts  Follow-up in about 3 months  Patient is encouraged to give [name] a call if Ambien is not tolerated or we need to increase the dose for effect   Virl Diamond MD [facility] 12/25/2023, 9:28 AM  CC: Felix Pacini, *

## 2023-12-25 NOTE — Patient Instructions (Addendum)
 Download from your machine shows it is working effectively with the number of events the machine is sensing only 1 -Going up on the pressure may not necessarily make you feel any better  We will try sleep aid, try a low-dose of Ambien, we can always go to a higher dose if needed  We may also need to try a different sleep aid if needed  Increase activity as tolerated  We will see you in about 3 months

## 2023-12-30 ENCOUNTER — Emergency Department (HOSPITAL_COMMUNITY)

## 2023-12-30 ENCOUNTER — Ambulatory Visit (INDEPENDENT_AMBULATORY_CARE_PROVIDER_SITE_OTHER): Payer: 59 | Admitting: Licensed Clinical Social Worker

## 2023-12-30 ENCOUNTER — Other Ambulatory Visit: Payer: Self-pay

## 2023-12-30 ENCOUNTER — Emergency Department (HOSPITAL_COMMUNITY)
Admission: EM | Admit: 2023-12-30 | Discharge: 2023-12-30 | Disposition: A | Discharge status: Home / Self Care | Attending: Emergency Medicine | Admitting: Emergency Medicine

## 2023-12-30 ENCOUNTER — Encounter (HOSPITAL_COMMUNITY): Payer: Self-pay

## 2023-12-30 DIAGNOSIS — Z79899 Other long term (current) drug therapy: Secondary | ICD-10-CM | POA: Insufficient documentation

## 2023-12-30 DIAGNOSIS — F411 Generalized anxiety disorder: Secondary | ICD-10-CM

## 2023-12-30 DIAGNOSIS — Z7901 Long term (current) use of anticoagulants: Secondary | ICD-10-CM | POA: Diagnosis not present

## 2023-12-30 DIAGNOSIS — F4310 Post-traumatic stress disorder, unspecified: Secondary | ICD-10-CM

## 2023-12-30 DIAGNOSIS — G8190 Hemiplegia, unspecified affecting unspecified side: Secondary | ICD-10-CM | POA: Diagnosis not present

## 2023-12-30 DIAGNOSIS — R519 Headache, unspecified: Secondary | ICD-10-CM | POA: Insufficient documentation

## 2023-12-30 DIAGNOSIS — I6782 Cerebral ischemia: Secondary | ICD-10-CM | POA: Diagnosis not present

## 2023-12-30 DIAGNOSIS — R531 Weakness: Secondary | ICD-10-CM | POA: Diagnosis not present

## 2023-12-30 DIAGNOSIS — F331 Major depressive disorder, recurrent, moderate: Secondary | ICD-10-CM

## 2023-12-30 DIAGNOSIS — R7989 Other specified abnormal findings of blood chemistry: Secondary | ICD-10-CM | POA: Insufficient documentation

## 2023-12-30 DIAGNOSIS — D649 Anemia, unspecified: Secondary | ICD-10-CM | POA: Diagnosis not present

## 2023-12-30 LAB — CBC
HCT: 36.4 % (ref 36.0–46.0)
Hemoglobin: 11.7 g/dL — ABNORMAL LOW (ref 12.0–15.0)
MCH: 27.1 pg (ref 26.0–34.0)
MCHC: 32.1 g/dL (ref 30.0–36.0)
MCV: 84.3 fL (ref 80.0–100.0)
Platelets: 240 10*3/uL (ref 150–400)
RBC: 4.32 MIL/uL (ref 3.87–5.11)
RDW: 15.1 % (ref 11.5–15.5)
WBC: 6.1 10*3/uL (ref 4.0–10.5)
nRBC: 0 % (ref 0.0–0.2)

## 2023-12-30 LAB — DIFFERENTIAL
Abs Immature Granulocytes: 0.03 10*3/uL (ref 0.00–0.07)
Basophils Absolute: 0 10*3/uL (ref 0.0–0.1)
Basophils Relative: 0 %
Eosinophils Absolute: 0.1 10*3/uL (ref 0.0–0.5)
Eosinophils Relative: 2 %
Immature Granulocytes: 1 %
Lymphocytes Relative: 20 %
Lymphs Abs: 1.2 10*3/uL (ref 0.7–4.0)
Monocytes Absolute: 0.6 10*3/uL (ref 0.1–1.0)
Monocytes Relative: 9 %
Neutro Abs: 4.2 10*3/uL (ref 1.7–7.7)
Neutrophils Relative %: 68 %

## 2023-12-30 LAB — COMPREHENSIVE METABOLIC PANEL WITH GFR
ALT: 17 U/L (ref 0–44)
AST: 17 U/L (ref 15–41)
Albumin: 3.4 g/dL — ABNORMAL LOW (ref 3.5–5.0)
Alkaline Phosphatase: 156 U/L — ABNORMAL HIGH (ref 38–126)
Anion gap: 9 (ref 5–15)
BUN: 14 mg/dL (ref 6–20)
CO2: 24 mmol/L (ref 22–32)
Calcium: 10.1 mg/dL (ref 8.9–10.3)
Chloride: 104 mmol/L (ref 98–111)
Creatinine, Ser: 0.75 mg/dL (ref 0.44–1.00)
GFR, Estimated: >60 mL/min (ref >60)
Glucose, Bld: 99 mg/dL (ref 70–99)
Potassium: 4 mmol/L (ref 3.5–5.1)
Sodium: 137 mmol/L (ref 135–145)
Total Bilirubin: 0.4 mg/dL (ref 0.0–1.2)
Total Protein: 7.6 g/dL (ref 6.5–8.1)

## 2023-12-30 LAB — PROTIME-INR
INR: 1.4 — ABNORMAL HIGH (ref 0.8–1.2)
Prothrombin Time: 17.4 s — ABNORMAL HIGH (ref 11.4–15.2)

## 2023-12-30 LAB — I-STAT CHEM 8, ED
BUN: 15 mg/dL (ref 6–20)
Calcium, Ion: 1.26 mmol/L (ref 1.15–1.40)
Chloride: 105 mmol/L (ref 98–111)
Creatinine, Ser: 0.8 mg/dL (ref 0.44–1.00)
Glucose, Bld: 97 mg/dL (ref 70–99)
HCT: 37 % (ref 36.0–46.0)
Hemoglobin: 12.6 g/dL (ref 12.0–15.0)
Potassium: 4 mmol/L (ref 3.5–5.1)
Sodium: 139 mmol/L (ref 135–145)
TCO2: 25 mmol/L (ref 22–32)

## 2023-12-30 LAB — CBG MONITORING, ED
Glucose-Capillary: 65 mg/dL — ABNORMAL LOW (ref 70–99)
Glucose-Capillary: 71 mg/dL (ref 70–99)

## 2023-12-30 LAB — APTT: aPTT: 39 s — ABNORMAL HIGH (ref 24–36)

## 2023-12-30 LAB — ETHANOL: Alcohol, Ethyl (B): <10 mg/dL (ref <10)

## 2023-12-30 MED ORDER — SODIUM CHLORIDE 0.9% FLUSH: 3.0000 mL | Freq: Once | INTRAVENOUS | Status: DC

## 2023-12-30 MED ORDER — MAGNESIUM SULFATE 2 GM/50ML IV SOLN
2.0000 g | Freq: Once | INTRAVENOUS | Status: AC
  Administered 2023-12-30: 2 g via INTRAVENOUS
  Filled 2023-12-30: qty 50

## 2023-12-30 MED ORDER — ACETAMINOPHEN 160 MG/5ML PO SOLN
1000.0000 mg | Freq: Once | ORAL | Status: AC
  Administered 2023-12-30: 1000 mg via ORAL
  Filled 2023-12-30: qty 40.6

## 2023-12-30 MED ORDER — OXYCODONE HCL 5 MG PO TABS
10.0000 mg | ORAL_TABLET | ORAL | Status: AC
  Administered 2023-12-30: 10 mg via ORAL
  Filled 2023-12-30: qty 2

## 2023-12-30 MED ORDER — METOCLOPRAMIDE HCL 5 MG/ML IJ SOLN
10.0000 mg | Freq: Once | INTRAMUSCULAR | Status: AC
  Administered 2023-12-30: 10 mg via INTRAVENOUS
  Filled 2023-12-30: qty 2

## 2023-12-30 NOTE — Code Documentation (Signed)
 Stroke Response Nurse Documentation Code Documentation  Meghan [name] is a 58 y.o. female arriving to [facility]  via [facility] on 4/14 with past medical hx of a flutter, htn, sleep apnea, seizure, DM. On Xarelto (rivaroxaban) daily. Code stroke was activated by EMS.   Patient from [facility] where she was LKW at 0930 and now complaining of L sided weakness and headache. She had completed a water aerobics class and spent some time in the hot tub afterwards. She was getting to warm and developed a headache as she was getting out of the hot tub. She had L sided weakness and difficulty talking on EMS arrival as well.    Stroke team at the bedside on patient arrival. Labs drawn and patient cleared for CT by EDP. Patient to CT with team. NIHSS 8, see documentation for details and code stroke times. Patient with disoriented, left arm weakness, left leg weakness, and left decreased sensation on exam. The following imaging was completed:  CT Head. Patient is not a candidate for IV Thrombolytic due to being on Xarelto with no missed doses. Patient is not a candidate for IR due to no LVO suspected per MD.   Care Plan: q2 NIHSS and vitals x 12 hours then q4. Migraine cocktail, possible MRI.   Bedside handoff with ED RN Alana.    Marlon Simpson  Stroke Response RN

## 2023-12-30 NOTE — Consult Note (Signed)
 NEUROLOGY CONSULT NOTE   Date of service: December 30, 2023 Patient Name: Meghan [name] MRN:  [id number] DOB:  Apr 25, 1966 Chief Complaint: "Code Stroke" Requesting Provider: Margarita Grizzle, MD  History of Present Illness  Meghan [name] is a 58 y.o. female with hx of a flutter, arthritis, headache, hypertension, sleep apnea with CPAP, seizure disorder, type 2 diabetes presenting with left-sided weakness and headache. She went to the local [employer] this morning to take a water aerobics class. She finished her first class and was going to get second class when she got to the hot tub and then had to get out of the hot tub and sit on the bleachers because she felt too warm.  She then felt a sharp shooting pain from her mid forehead and left eye.  EMS was called and on their arrival she additionally had left sided weakness and difficulty talking. She currently describes a throbbing headache.  She was recently seen with a similar presentation in January that improved after a migraine cocktail.  She states that she takes Tylenol approximately once per week for her arthritis in her knee.  She denies using her rizatriptan ever. Does not use ibuprofen containing products due to her chronic anticoagulation.   LKW: 9:30 AM per EMS report  Modified rankin score: 0-Completely asymptomatic and back to baseline post- stroke IV Thrombolysis: No on Xarelto  EVT: No, exam consistent with prior   NIHSS components Score: Comment  1a Level of Conscious 0[x]  1[]  2[]  3[]      1b LOC Questions 0[]  1[x]  2[]     States month as may  1c LOC Commands 0[x]  1[]  2[]       2 Best Gaze 0[x]  1[]  2[]       3 Visual 0[x]  1[]  2[]  3[]      4 Facial Palsy 0[x]  1[]  2[]  3[]      5a Motor Arm - left 0[]  1[]  2[]  3[x]  4[]  UN[]   On initial arrival elevates left upper extremity antigravity to correct EMS. On exam unable to elevate left upper extremity  5b Motor Arm - Right 0[x]  1[]  2[]  3[]  4[]  UN[]    6a Motor Leg - Left 0[]  1[]  2[]  3[x]  4[]  UN[]    6b  Motor Leg - Right 0[x]  1[]  2[]  3[]  4[]  UN[]    7 Limb Ataxia 0[x]  1[]  2[]  3[]  UN[]     8 Sensory 0[]  1[x]  2[]  UN[]    States unable to feel light touch on left side, able to identify pinky and thumb by touch when asked   9 Best Language 0[x]  1[]  2[]  3[]      10 Dysarthria 0[x]  1[]  2[]  UN[]      11 Extinct. and Inattention 0[x]  1[]  2[]       TOTAL: 8       ROS  Limited by slow responses   Past History   Past Medical History:  Diagnosis Date   (HFpEF) heart failure with preserved ejection fraction (HCC) 01/2019   a.) Dx'd in [place], [place]; b.) TTE 08/14/2022: EF 60-65%, mod LVH, mild LAE, triv MR   Anxiety    Asthma    Atrial fibrillation and flutter (HCC) 02/16/2019   a.) CHA2DS2VASc = 6 (sex, HFpEF, HTN, CVA x2, T2DM);  b.) s/p DCCV (200 J x 1) 08/22/2022; c.) s/p RF ablation (CTI) 11/02/2022; d.) rate/rhythm maintained on oral flecainide + metoprolol succinate; chronically anticoagulated with rivaroxaban   Class 3 severe obesity due to excess calories with body mass index (BMI) of 50.0 to 59.9 in adult [facility])  DDD (degenerative disc disease), lumbar    GERD (gastroesophageal reflux disease)    Hiatal hernia    History of head injury    HTN (hypertension)    Hypercholesterolemia    Ileus (HCC)    Intractable nausea and vomiting 11/16/2020   Long term current use of anticoagulant    a.) rivaroxaban   Macular degeneration of left eye    MDD (major depressive disorder)    OA (osteoarthritis)    OSA on CPAP    Pneumonia    Presence of IVC filter    PTSD (post-traumatic stress disorder)    Recurrent pulmonary emboli (HCC)    a.) s/p IVC filter placement 2003; b.) previously followed by hematology when living in [place], [place]   Seizure disorder [facility])    a.) last was in 2020 per pt report   Stroke (HCC)    1989 and 1995 (left sided weakness)   T2DM (type 2 diabetes mellitus) (HCC)    Vitamin D deficiency     Past Surgical History:  Procedure Laterality Date   A-FLUTTER  ABLATION N/A 11/02/2022   Procedure: A-FLUTTER ABLATION;  Surgeon: Mealor, Roberts Gaudy, MD;  Location: [facility];  Service: Cardiovascular;  Laterality: N/A;   ARTHRODESIS METATARSALPHALANGEAL JOINT (MTPJ) Left 02/25/2023   Procedure: ARTHRODESIS METATARSALPHALANGEAL JOINT (MTPJ);  Surgeon: Candelaria Stagers, DPM;  Location: [facility] ORS;  Service: Podiatry;  Laterality: Left;   CARDIAC CATHETERIZATION  2017   in [facility] in [place]   CARDIOVERSION N/A 08/22/2022   Procedure: CARDIOVERSION;  Surgeon: Thomasene Ripple, DO;  Location: [facility] ENDOS[facility];  Service: Cardiovascular;  Laterality: N/A;   CESAREAN SECTION N/A 1989   CESAREAN SECTION N/A 1993   CHOLECYSTECTOMY  2003   COLONOSCOPY WITH PROPOFOL N/A 06/05/2019   Procedure: COLONOSCOPY WITH PROPOFOL;  Surgeon: Jeani Hawking, MD;  Location: [facility];  Service: Endoscopy;  Laterality: N/A;   CYST REMOVAL WITH BONE GRAFT Left 10/18/2020   Procedure: BONE GRAFTING OF ENCHONDROMA MIDDLE PHANLANX OF LEFT MIDDLE FINGER;  Surgeon: Cindee Salt, MD;  Location: [facility] OR;  Service: Orthopedics;  Laterality: Left;  AXILLARY BLOCK   DIAGNOSTIC LAPAROSCOPY  2015; 2017   lap hernia repair x2   ENDOVENOUS ABLATION SAPHENOUS VEIN W/ LASER Left 08/31/2021   endovenous laser ablation left greater saphenous vein and stab phlebectomy 10-20 incisions left leg by Lemar Livings MD   HALLUX FUSION Right 05/28/2022   Procedure: HALLUX FUSION METATARSAL PHALANGEAL JOIT;  Surgeon: Candelaria Stagers, DPM;  Location: [facility];  Service: Podiatry;  Laterality: Right;  BLOCK   HERNIA REPAIR  2014, 2018   umbilical hernia repair   IVC FILTER INSERTION  2003   POLYPECTOMY  06/05/2019   Procedure: POLYPECTOMY;  Surgeon: Jeani Hawking, MD;  Location: [facility];  Service: Endoscopy;;   SHOULDER ARTHROSCOPY W/ ROTATOR CUFF REPAIR Right 08/28/2017   TOTAL ABDOMINAL HYSTERECTOMY  2003   UPPER GI ENDOSCOPY     WRIST ARTHROSCOPY WITH DEBRIDEMENT Left  10/18/2020   Procedure: LEFT WRIST ARTHROSCOPY WITH DEBRIDEMENT;  Surgeon: Cindee Salt, MD;  Location: [facility];  Service: Orthopedics;  Laterality: Left;  AXILLARY BLOCK   WRIST ARTHROSCOPY WITH DEBRIDEMENT Right 01/25/2022   Procedure: ARTHROSCOPY RIGHT WRIST WITH  DEBRIDEMENT/ SHRINKAGE;  Surgeon: Betha Loa, MD;  Location: [facility] OR;  Service: Orthopedics;  Laterality: Right;    Family History: Family History  Problem Relation Age of Onset   Breast cancer Mother    Heart attack Father  Diabetes Father    Congestive Heart Failure Father    Breast cancer Maternal Grandmother     Social History  reports that she has never smoked. She has never used smokeless tobacco. She reports that she does not drink alcohol and does not use drugs.  Allergies  Allergen Reactions   Acetaminophen-Codeine Swelling, Rash and Other (See Comments)    Tylenol with Codeine, Tylenol #3,  (facial swelling, hives)   Bee Venom Anaphylaxis   Meloxicam Anaphylaxis and Rash   Tramadol Hives and Other (See Comments)    Patient stated "I was trippin' and I do not want that ever again"   Codeine Swelling    hives   Coconut (Cocos Nucifera) Rash and Other (See Comments)    ANY coconut products    Tomato Rash    Fresh     Medications   Current Facility-Administered Medications:    sodium chloride flush (NS) 0.9 % injection 3 mL, 3 mL, Intravenous, Once, Auston Blush, MD  Current Outpatient Medications:    albuterol (VENTOLIN HFA) 108 (90 Base) MCG/ACT inhaler, Inhale 2 puffs into the lungs every 6 (six) hours as needed for wheezing or shortness of breath., Disp: , Rfl:    baclofen (LIORESAL) 10 MG tablet, Take 1 tablet (10 mg total) by mouth 2 (two) times daily as needed for muscle spasms., Disp: 30 each, Rfl: 0   bumetanide (BUMEX) 1 MG tablet, Take 1 tablet (1 mg total) by mouth daily for 10 days. (Patient taking differently: Take 1 mg by mouth every morning.), Disp: 10 tablet, Rfl: 0   Cholecalciferol  (VITAMIN D-3) 125 MCG (5000 UT) TABS, Take 5,000 Units by mouth daily., Disp: , Rfl:    dicyclomine (BENTYL) 20 MG tablet, Take 20 mg by mouth 3 (three) times daily., Disp: , Rfl:    lidocaine (LIDODERM) 5 %, Place 1 patch onto the skin daily. Remove & Discard patch within 12 hours or as directed by MD, Disp: 30 patch, Rfl: 0   lidocaine (XYLOCAINE) 2 % solution, Use as directed 15 mLs in the mouth or throat every 3 (three) hours as needed for mouth pain. Swish/gargle and spit out, Disp: 100 mL, Rfl: 0   metFORMIN (GLUCOPHAGE) 500 MG tablet, Take 500 mg by mouth daily with breakfast., Disp: , Rfl:    MOUNJARO 7.5 MG/0.5ML Pen, Inject 7.5 mg into the skin once a week., Disp: , Rfl:    Omega-3 Fatty Acids (FISH OIL) 1200 MG CAPS, Take 1,200 mg by mouth daily with breakfast., Disp: , Rfl:    omeprazole (PRILOSEC) 40 MG capsule, Take 40 mg by mouth 2 (two) times daily., Disp: , Rfl:    rivaroxaban (XARELTO) 20 MG TABS tablet, Take 20 mg by mouth at bedtime. , Disp: , Rfl:    rizatriptan (MAXALT) 10 MG tablet, Take 10 mg by mouth as needed for migraine., Disp: , Rfl:    rosuvastatin (CRESTOR) 10 MG tablet, Take 10 mg by mouth at bedtime., Disp: , Rfl:    sertraline (ZOLOFT) 50 MG tablet, Take 50 mg by mouth in the morning., Disp: , Rfl:    zolpidem (AMBIEN CR) 6.25 MG CR tablet, Take 1 tablet (6.25 mg total) by mouth at bedtime., Disp: 30 tablet, Rfl: 2  Vitals  There were no vitals filed for this visit.  There is no height or weight on file to calculate BMI.  Physical Exam   Constitutional: Appears well-developed and well-nourished.  Psych: Affect appropriate to situation,  Eyes: No  scleral injection HENT: No oropharyngeal obstruction.  MSK: no joint deformities.  Cardiovascular: Normal rate and regular rhythm.  Perfusing extremities well Respiratory: Effort normal, non-labored breathing GI: Soft.  No distension. There is no tenderness.  Skin: Warm dry and intact visible skin  Neurologic  Examination   Mental Status: Patient is awake, alert, oriented to person, place, year, and situation.  States the month is May Patient is able to give a clear and coherent history. No signs of aphasia or neglect Cranial Nerves: II: Visual Fields are full. Pupils are equal, round, and reactive to light.   III,IV, VI: EOMI without ptosis or diploplia.  V: Facial sensation is symmetric to temperature VII: Facial movement is symmetric (poor effort bilaterally)  VIII: hearing is intact to voice X: Uvula elevates symmetrically XI: Shoulder shrug is symmetric. XII: tongue is midline without atrophy or fasciculations.  Motor: Tone is normal. Bulk is normal.  Full strength noted on right upper and lower extremity.  She is unable to elevate her left arm or leg.  Of note, on her initial arrival she did elevate her left arm and hold up 1 finger with her left hand to indicate that she only took 1 aerobics class after EMS initially said she took 2 and moves the left side at other times when not being formally tested Sensory: Endorses no sensation in the left upper and lower extremity on formal sensory testing. However, she is able to name which finger is being touched on the left during naming testing. Cerebellar: FNF and HKS are intact on the right Gait:  Deferred in acute setting    Labs/Imaging/Neurodiagnostic studies   CBC: No results for input(s): "WBC", "NEUTROABS", "HGB", "HCT", "MCV", "PLT" in the last 168 hours. Basic Metabolic Panel:  Lab Results  Component Value Date   NA 140 09/25/2023   K 3.7 09/25/2023   CO2 22 09/25/2023   GLUCOSE 92 09/25/2023   BUN 19 09/25/2023   CREATININE 0.80 09/25/2023   CALCIUM 9.6 09/25/2023   GFRNONAA >60 09/25/2023   GFRAA >60 05/11/2020   Lipid Panel: No results found for: "LDLCALC" HgbA1c:  Lab Results  Component Value Date   HGBA1C 5.0 11/08/2022   Urine Drug Screen:     Component Value Date/Time   LABOPIA NONE DETECTED 09/29/2020 1451    COCAINSCRNUR NONE DETECTED 09/29/2020 1451   LABBENZ NONE DETECTED 09/29/2020 1451   AMPHETMU NONE DETECTED 09/29/2020 1451   THCU NONE DETECTED 09/29/2020 1451   LABBARB NONE DETECTED 09/29/2020 1451    Alcohol Level     Component Value Date/Time   ETH <10 09/25/2023 0939   INR  Lab Results  Component Value Date   INR 1.6 (H) 09/25/2023   APTT  Lab Results  Component Value Date   APTT 35 09/25/2023   AED levels: No results found for: "PHENYTOIN", "ZONISAMIDE", "LAMOTRIGINE", "LEVETIRACETA"  CT Head without contrast(Personally reviewed): 1. No evidence of an acute intracranial abnormality. 2. Mild chronic small vessel ischemic changes within the cerebral white matter.  ASSESSMENT   LAYKEN BEG is a 58 y.o. female with a past medical history of A-fib on Xarelto (compliant) and previous presentation with severe headache and hemiplegia.  Exam is inconsistent and presentation is consistent with complex migraine with significant functional overlay on exam.  At this time we recommend a migraine cocktail and MRI if her symptoms do not improve.  RECOMMENDATIONS  - Migraine cocktail   - Previously have used Mag 2g, reglan 10mg  , tylenol  1000mg  with success (ordered) - MRI only if symptoms do not improve - Inpatient neurology will sign off at this time, please reach out if MRI is ordered and positive or further input is needed ______________________________________________________________________  Seen with assistance of NP Imogene Mana   Attending Neurologist's note:  I personally saw this patient, gathering history, performing a full neurologic examination, reviewing relevant labs, personally reviewing relevant imaging including head CT, and formulated the assessment and plan, adding the note above for completeness and clarity to accurately reflect my thoughts   Baldwin Levee MD-PhD [facility] [phone]

## 2023-12-30 NOTE — ED Triage Notes (Addendum)
 Pt arrived via [facility] from [facility] as a code stroke. Pt states she was at [facility] taking pool aerobics class and got out of the pool and went into hot tub, because her back was hurting. Pt states she was in hot tub for 5 mins then went back into pool and continued pool aerobics class and half way through developed a severe HA. Pt states she then got out of the pool and had blurred vision. Per EMS, pt had left sided weakness on entire side, decreased sensation to left side of body and it was hard to speak. Pt LKW 0930 today

## 2023-12-30 NOTE — Progress Notes (Addendum)
 Virtual Visit via Video Note   I connected with Meghan [name] on 12/30/23 at 2:00pm by video enabled telemedicine application and verified that I am speaking with the correct person using two identifiers.   I discussed the limitations, risks, security and privacy concerns of performing an evaluation and management service by video and the availability of in person appointments. I also discussed with the patient that there may be a patient responsible charge related to this service. The patient expressed understanding and agreed to proceed.   I discussed the assessment and treatment plan with the patient. The patient was provided an opportunity to ask questions and all were answered. The patient agreed with the plan and demonstrated an understanding of the instructions.   The patient was advised to call back or seek an in-person evaluation if the symptoms worsen or if the condition fails to improve as anticipated.   I provided 47 minutes of non-face-to-face time during this encounter.   Desmond [place], LCSW, LCAS _______________________ THERAPIST PROGRESS NOTE   Session Time:  2:00pm - 2:47pm    Location: Patient: Patient Home Provider: Home Office    Participation Level: Active    Behavioral Response: Alert, casually dressed, anxious mood/affect   Type of Therapy:  Individual Therapy   Treatment Goals addressed: Mood management; Medication management; Safety planning   Progress Towards Goals: Progressing   Interventions: CBT, safety planning, supportive therapy    Summary: Meghan [name] is a 58 year old divorced African American female that presented for therapy appointment today with diagnoses of Major Depressive Disorder, recurrent, moderate, Generalized Anxiety Disorder and PTSD.      Suicidal/Homicidal: None; without plan or intent                                                                                                                Therapist Response:  Clinician met with  Meghan [name] for virtual therapy appointment and assessed for safety, sobriety, and medication compliance.  Meghan [name] presented for session on time and was alert, oriented x5, with no evidence or self-report of active SI/HI or A/V H.  Meghan [name] reported ongoing compliance with medication and denied any use of alcohol or illicit substances.  Clinician inquired about Meghan [name]'s current emotional ratings, as well as any significant changes in thoughts, feelings, or behavior since previous check-in.  Meghan [name] reported scores of 0/10 for depression, 3/10 for anxiety, and 0/10 for anger/irritability.  Meghan [name] denied any recent panic attacks or outbursts.  Meghan [name] reported that a recent struggle was having to go to the hospital this morning.  Clinician observed Spruha in a waiting area and inquired about whether confidentiality could be maintained.  Narcisa reported that she was alone with the exception of her daughter, but she didn't mind if her daughter overheard the conversation.  Clinician inquired about what factors may have led to this hospitalization.  Meghan [name] reported that she was at a water aerobics class around 10am, and during a break she had gone to the hot tub for around 5 minutes.  Candelaria reported that when she got out, she experienced blurry vision, a shooting pain in her head, and numbness in one side of her body, which she worried was sign of a stroke.  Meghan [name] reported that although she dislikes hospitals, but recognized that she needed professional help and asked them to call an ambulance.  Meghan [name] reported that upon arriving at the hospital, some tests were run, and she was given medication for pain. Clinician praised Meghan [name] for asking for help during this crisis.  Clinician inquired about what the diagnosis was, and what may have caused this medical event based upon feedback from her attending medical team.  Meghan [name] reported that she could not recall the diagnosis, or potential causes, which made her anxious.  Clinician assisted Meghan [name] by  pulling up related documentation from today's ED visit with her verbal consent, which revealed primary diagnosis as 'Acute Nonintractable headache'.  Clinician pulled up literature on this condition to provide Greg with clarity on what it is, how it may have been triggered, and treatment recommendations.  Intervention was effective, as Meghan [name] reported that this helped increase insight into potential factors influencing today's medical event.  She acknowledged that she was dehydrated, fatigued from exercise, and has been stressed about her son this week, which all could have been triggers.  Meghan [name] reported that she has taken pain medication from the doctor as prescribed, plans to rest today in order to recuperate, and she has a family member at her apartment monitoring her condition.  She asked to end session early in order to rest, as she began to feel more tired due to effects of medication.  Clinician was agreeable to this, and encouraged Jenia to keep her doctors up to date on condition, and to call 911 again if similar symptoms arise.  Fahmida was agreeable to recommendations.  Clinician will continue to monitor.          Plan: Follow up again in 1-2 weeks.    Diagnosis: Major depressive disorder, recurrent, moderate; Generalized Anxiety Disorder; and PTSD   Collaboration of Care:   No collaboration of care required for this visit.                                                   Patient/Guardian was advised Release of Information must be obtained prior to any record release in order to collaborate their care with an outside provider. Patient/Guardian was advised if they have not already done so to contact the registration department to sign all necessary forms in order for us  to release information regarding their care.    Consent: Patient/Guardian gives verbal consent for treatment and assignment of benefits for services provided during this visit. Patient/Guardian expressed understanding and agreed to  proceed.  Desmond [place], [place], LCAS 12/30/23

## 2023-12-30 NOTE — ED Provider Notes (Signed)
 [place] EMERGENCY DEPARTMENT AT [facility] Provider Note   CSN: [id number] Arrival date & time: 12/30/23  1026  An emergency department physician performed an initial assessment on this suspected stroke patient at 1029.  History  Chief Complaint  Patient presents with   Code Stroke    Meghan [name] is a 58 y.o. female.  HPI 58 year old female history of morbid obesity, A-fib RVR, on chronic anticoagulation with Xarelto with last dose last night at dinner presents today with onset of left-sided weakness while swimming today.  This occurred just prior to arrival.  Patient was seen and cleared at the bridge and I initially evaluated patient in CT scanner with neurology at bedside. 1:41 PM [facility].  She took 1 and was getting ready to take the second 1.  She got to the hot tub and then sat down the bleachers because she felt warm.  She then had a sharp shooting pain from the mid forehead to around the left eye.  She has not had similar pain like this in the past although notes from January 2025 note that she was here with similar symptoms that included headache.  Patient denies any fever, neck pain, head injury, or other new symptoms.  She is on Eliquis.  States at that time she felt like she had some left arm weakness.    Home Medications Prior to Admission medications   Medication Sig Start Date End Date Taking? Authorizing Provider  albuterol (VENTOLIN HFA) 108 (90 Base) MCG/ACT inhaler Inhale 2 puffs into the lungs every 6 (six) hours as needed for wheezing or shortness of breath.    [provider]  amoxicillin (AMOXIL) 500 MG tablet Take 500 mg by mouth 3 (three) times daily. 12/27/23   [provider]  bumetanide (BUMEX) 1 MG tablet Take 1 tablet (1 mg total) by mouth daily for 10 days. 12/25/22 11/08/23  Valinda Hoar, NP  chlorhexidine (PERIDEX) 0.12 % solution Use as directed in the mouth or  throat. 12/27/23   [provider]  Cholecalciferol (VITAMIN D-3) 125 MCG (5000 UT) TABS Take 5,000 Units by mouth daily.    [provider]  lidocaine (LIDODERM) 5 % Place 1 patch onto the skin daily. Remove & Discard patch within 12 hours or as directed by MD 11/08/23   Raspet, Denny Peon K, PA-C  lidocaine (XYLOCAINE) 2 % solution Use as directed 15 mLs in the mouth or throat every 3 (three) hours as needed for mouth pain. Swish/gargle and spit out 10/17/23   Rising, Lurena Joiner, PA-C  metFORMIN (GLUCOPHAGE) 500 MG tablet Take 500 mg by mouth daily with breakfast.    [provider]  MOUNJARO 7.5 MG/0.5ML Pen Inject 7.5 mg into the skin once a week. 09/04/23   [provider]  Omega-3 Fatty Acids (FISH OIL) 1200 MG CAPS Take 1,200 mg by mouth daily with breakfast.    [provider]  omeprazole (PRILOSEC) 40 MG capsule Take 40 mg by mouth 2 (two) times daily. 08/17/20   [provider]  oxyCODONE-acetaminophen (PERCOCET/ROXICET) 5-325 MG tablet Take 1 tablet by mouth 3 (three) times daily as needed. 12/27/23   [provider]  rivaroxaban (XARELTO) 20 MG TABS tablet Take 20 mg by mouth at bedtime.     [provider]  rizatriptan (MAXALT) 10 MG tablet Take 10 mg by mouth as needed for migraine. 07/10/23   [provider]  rosuvastatin (CRESTOR) 10 MG tablet Take 10 mg by mouth at bedtime. 07/10/23   [provider]  sertraline (ZOLOFT) 50 MG tablet Take 50 mg by mouth in the morning. 01/01/21   [provider]  zolpidem (AMBIEN CR) 6.25 MG CR tablet Take 1 tablet (6.25 mg total) by mouth at bedtime. 12/25/23   Margaretann Sharper, MD      Allergies    Acetaminophen-codeine, Bee venom, Meloxicam, Tramadol, Codeine, Coconut (cocos nucifera), and Tomato    Review of Systems   Review of Systems  Physical Exam Updated Vital Signs BP 116/89   Pulse (!) 56   Temp 98.1 F (36.7 C) (Oral)   Resp 18   Wt (!) 142.7  kg   SpO2 96%   BMI 52.34 kg/m  Physical Exam Vitals reviewed.  Constitutional:      Appearance: She is obese.  HENT:     Head: Normocephalic.     Left Ear: External ear normal.     Nose: Nose normal.     Mouth/Throat:     Pharynx: Oropharynx is clear.  Eyes:     Extraocular Movements: Extraocular movements intact.     Pupils: Pupils are equal, round, and reactive to light.  Cardiovascular:     Rate and Rhythm: Normal rate.     Pulses: Normal pulses.  Pulmonary:     Effort: Pulmonary effort is normal.     Breath sounds: Normal breath sounds.  Abdominal:     General: Abdomen is flat.     Palpations: Abdomen is soft.  Musculoskeletal:        General: Normal range of motion.     Cervical back: Normal range of motion.  Skin:    General: Skin is warm.  Neurological:     General: No focal deficit present.     Mental Status: She is alert.  Psychiatric:        Mood and Affect: Mood normal.     ED Results / Procedures / Treatments   Labs (all labs ordered are listed, but only abnormal results are displayed) Labs Reviewed  PROTIME-INR - Abnormal; Notable for the following components:      Result Value   Prothrombin Time 17.4 (*)    INR 1.4 (*)    All other components within normal limits  APTT - Abnormal; Notable for the following components:   aPTT 39 (*)    All other components within normal limits  CBC - Abnormal; Notable for the following components:   Hemoglobin 11.7 (*)    All other components within normal limits  COMPREHENSIVE METABOLIC PANEL WITH GFR - Abnormal; Notable for the following components:   Albumin 3.4 (*)    Alkaline Phosphatase 156 (*)    All other components within normal limits  CBG MONITORING, ED - Abnormal; Notable for the following components:   Glucose-Capillary 65 (*)    All other components within normal limits  DIFFERENTIAL  ETHANOL  I-STAT CHEM 8, ED  CBG MONITORING, ED    EKG EKG Interpretation Date/Time:  Monday December 30 2023 10:44:50 EDT Ventricular Rate:  64 PR Interval:  235 QRS Duration:  108 QT Interval:  400 QTC Calculation: 413 R Axis:   73  Text Interpretation: Sinus rhythm Prolonged PR interval Nonspecific T abnormalities, lateral leads Confirmed by Auston Blush [phone]) on 12/30/2023 11:18:44 AM  Radiology CT HEAD CODE STROKE WO CONTRAST Result Date: 12/30/2023 CLINICAL DATA:  Code stroke. Provided history: Neuro deficit, acute, stroke suspected.  Aphasia. Left-sided weakness. EXAM: CT HEAD WITHOUT CONTRAST TECHNIQUE: Contiguous axial images were obtained from the base of the skull through the vertex without intravenous contrast. RADIATION DOSE REDUCTION: This exam was performed according to the departmental dose-optimization program which includes automated exposure control, adjustment of the mA and/or kV according to patient size and/or use of iterative reconstruction technique. COMPARISON:  Brain MRI 09/25/2023.  Head CT 09/25/2023. FINDINGS: Brain: No age-advanced or lobar predominant cerebral atrophy. Patchy and ill-defined hypoattenuation within the cerebral white matter, nonspecific but compatible with mild chronic small vessel ischemic disease. There is no acute intracranial hemorrhage. No demarcated cortical infarct. No extra-axial fluid collection. No evidence of an intracranial mass. No midline shift. Vascular: No hyperdense vessel. Atherosclerotic calcifications. Skull: No calvarial fracture or aggressive osseous lesion. Sinuses/Orbits: No mass or acute finding within the imaged orbits. Trace mucosal thickening scattered within the paranasal sinuses at the imaged levels. ASPECTS [facility] Stroke Program Early CT Score) - Ganglionic level infarction (caudate, lentiform nuclei, internal capsule, insula, M1-M3 cortex): 7 - Supraganglionic infarction (M4-M6 cortex): 3 Total score (0-10 with 10 being normal): 10 No evidence of an acute intracranial abnormality. These results were communicated to Dr. Iver Nestle  At 10:53 amon 4/14/2025by text page via the [facility] messaging system. IMPRESSION: 1. No evidence of an acute intracranial abnormality. 2. Mild chronic small vessel ischemic changes within the cerebral white matter. Electronically Signed   By: Jackey Loge D.O.   On: 12/30/2023 10:54    Procedures Procedures    Medications Ordered in ED Medications  sodium chloride flush (NS) 0.9 % injection 3 mL (has no administration in time range)  magnesium sulfate IVPB 2 g 50 mL (0 g Intravenous Stopped 12/30/23 1242)  acetaminophen (TYLENOL) 160 MG/5ML solution 1,000 mg (1,000 mg Oral Given 12/30/23 1104)  metoCLOPramide (REGLAN) injection 10 mg (10 mg Intravenous Given 12/30/23 1113)  oxyCODONE (Oxy IR/ROXICODONE) immediate release tablet 10 mg (10 mg Oral Given 12/30/23 1242)    ED Course/ Medical Decision Making/ A&P Clinical Course as of 12/30/23 1341  Mon Dec 30, 2023  1115 Neurology NP informed me that patient now complaining of severe headache c.w. prior presentation and was given acetaminophen, reglan, and mag-ordered per NP [DR]  1117 BC reviewed and interpreted consistent with mild stable anemia no other acute abnormalities noted INR elevated at 1.4 consistent with patient's chronic anticoagulation CMET reviewed interpreted significant for ovation of alk phos at 156 otherwise within normal limits [DR]  1117 Alk phos elevation stable from prior [DR]  1150 I have reexamined the patient and she appears to have normal neurological exam at this time [DR]    Clinical Course User Index [DR] Margarita Grizzle, MD                                 Medical Decision Making Amount and/or Complexity of Data Reviewed Labs: ordered. Radiology: ordered.  Risk Prescription drug management.   1:41 PM Headache resolved Neuroexam normal  58 year old female presents today complaining of headache.  She described as starting of her left eye.  She felt like her left arm might be a little weak at that  time. She was initially presented as code stroke.  Patient is on blood thinners.  She was within stroke window and had code stroke initiated.  She was Co. evaluated with neurology.  There was no definite neurological abnormality noted on exam. Review of old records shows a prior  similar presentation when no stroke was noted. Head CT was obtained and no evidence of bleeding or other acute abnormality noted. Patient has been reexamined multiple times and has no focal neurological deficit on exam.  She did continue to complain of some headache.  She received medications including Reglan, acetaminophen, and p.o. oxycodone. She has had complete resolution of her symptoms. Care was discussed with neurology.  Discussed with patient return precautions need for follow-up she voices understanding.        Final Clinical Impression(s) / ED Diagnoses Final diagnoses:  Acute nonintractable headache, unspecified headache type  Chronic anticoagulation    Rx / DC Orders ED Discharge Orders     None         Auston Blush, MD 12/30/23 1342

## 2023-12-30 NOTE — Discharge Instructions (Addendum)
 You were evaluated here in the emergency department for your new headache and possible left arm weakness You were evaluated with CT scan and labs You have mild stable anemia No other abnormalities were noted Please return if you are having new or worsening symptoms. Please follow-up with your doctor this week

## 2024-01-06 ENCOUNTER — Ambulatory Visit (INDEPENDENT_AMBULATORY_CARE_PROVIDER_SITE_OTHER): Payer: 59 | Admitting: Licensed Clinical Social Worker

## 2024-01-06 DIAGNOSIS — F4310 Post-traumatic stress disorder, unspecified: Secondary | ICD-10-CM | POA: Diagnosis not present

## 2024-01-06 DIAGNOSIS — F411 Generalized anxiety disorder: Secondary | ICD-10-CM

## 2024-01-06 DIAGNOSIS — F331 Major depressive disorder, recurrent, moderate: Secondary | ICD-10-CM

## 2024-01-06 NOTE — Progress Notes (Signed)
 Virtual Visit via Video Note   I connected with Fredia S. Laham on 01/06/24 at 2:00pm by video enabled telemedicine application and verified that I am speaking with the correct person using two identifiers.   I discussed the limitations, risks, security and privacy concerns of performing an evaluation and management service by video and the availability of in person appointments. I also discussed with the patient that there may be a patient responsible charge related to this service. The patient expressed understanding and agreed to proceed.   I discussed the assessment and treatment plan with the patient. The patient was provided an opportunity to ask questions and all were answered. The patient agreed with the plan and demonstrated an understanding of the instructions.   The patient was advised to call back or seek an in-person evaluation if the symptoms worsen or if the condition fails to improve as anticipated.   I provided 41 minutes of non-face-to-face time during this encounter.   Desmond Florida, LCSW, LCAS _______________________ THERAPIST PROGRESS NOTE   Session Time:  2:00pm - 2:41pm     Location: Patient: Hospital Provider: [facility]    Participation Level: Active    Behavioral Response: Alert, casually dressed, euthymic mood/affect   Type of Therapy:  Individual Therapy   Treatment Goals addressed: Mood management; Medication management; Maintaining healthy boundaries with support network   Progress Towards Goals: Progressing   Interventions: CBT, communication skills    Summary: Jewelia S. Ebanks is a 58 year old divorced African American female that presented for therapy appointment today with diagnoses of Major Depressive Disorder, recurrent, moderate, Generalized Anxiety Disorder and PTSD.      Suicidal/Homicidal: None; without plan or intent                                                                                                                Therapist  Response:  Clinician met with Nandini for virtual therapy session and assessed for safety, sobriety, and medication compliance.  Tanylah presented for appointment on time and was alert, oriented x5, with no evidence or self-report of active SI/HI or A/V H.  Analisse reported ongoing compliance with medication and denied any use of alcohol or illicit substances.  Clinician inquired about Lenora's emotional ratings today, as well as any significant changes in thoughts, feelings, or behavior since last check-in.  Katrinna reported scores of 0/10 for depression, 0/10 for anxiety, and 0/10 for anger/irritability.  Mandee denied any recent panic attacks or outbursts.  Uzma reported that a recent struggle has been spending time with one of her daughters and grandson, who were visiting over the past week.  Cheyann reported that they have kept a busy schedule, and this left her feeling exhausted to the point where she forgot to put on her CPAP the night before, and slept poorly.  Jessamy reported that they still plan to move from [place] by August, which has her stressed, since this daughter was demanding in the past, and tested Giulia's boundaries often.  Clinician reviewed material with  Kayliegh on communication skills which could be utilized to increase understanding and support within the family.  Clinician covered a handout on 'soft startups' which offered suggestions on how Artesha could address a problem assertively with her daughter, including tips such as choosing an appropriate time/setting, being mindful of maintaining gentle tone, volume and language, while avoiding triggering nonverbals such as rolling eyes, as well as utilizing "I" statements to express feelings, focusing on one problem at a time, and being respectful.  Clinician also reminded Kalene that use of relaxation/coping skills taught from previous therapy sessions could help address difficult feelings such as anger which could arise when feeling triggered by other's behavior.   Intervention effectiveness was mixed, as Kachina did participate in discussion on subject, and was receptive to suggestions offered for addressing boundaries, but she acknowledged that due to poor sleep she had trouble focusing, and needed to rest before work at 4:30pm.  Leighton Punches stated "I need to remind my daughter I'm not 58 years old anymore, and I can't keep running all day like that.  I have to be ready to say 'No" now".  Clinician allowed Miasha to end session early and will continue to monitor.  Clinician will continue to monitor.          Plan: Follow up again in 1-2 weeks.    Diagnosis: Major depressive disorder, recurrent, moderate; Generalized Anxiety Disorder; and PTSD   Collaboration of Care:   No collaboration of care required for this visit.                                                   Patient/Guardian was advised Release of Information must be obtained prior to any record release in order to collaborate their care with an outside provider. Patient/Guardian was advised if they have not already done so to contact the registration department to sign all necessary forms in order for us  to release information regarding their care.    Consent: Patient/Guardian gives verbal consent for treatment and assignment of benefits for services provided during this visit. Patient/Guardian expressed understanding and agreed to proceed.  Desmond Florida, LCSW, LCAS 01/06/24

## 2024-01-13 ENCOUNTER — Ambulatory Visit (HOSPITAL_COMMUNITY): Payer: 59 | Admitting: Licensed Clinical Social Worker

## 2024-01-13 ENCOUNTER — Encounter (HOSPITAL_COMMUNITY): Payer: Self-pay

## 2024-01-20 ENCOUNTER — Other Ambulatory Visit: Payer: Self-pay | Admitting: Podiatry

## 2024-01-20 DIAGNOSIS — Z4889 Encounter for other specified surgical aftercare: Secondary | ICD-10-CM | POA: Diagnosis not present

## 2024-01-20 DIAGNOSIS — T8484XA Pain due to internal orthopedic prosthetic devices, implants and grafts, initial encounter: Secondary | ICD-10-CM | POA: Diagnosis not present

## 2024-01-20 MED ORDER — OXYCODONE-ACETAMINOPHEN 5-325 MG PO TABS: 1.0000 | ORAL_TABLET | ORAL | 0 refills | Status: DC | PRN

## 2024-01-20 MED ORDER — IBUPROFEN 800 MG PO TABS: 800.0000 mg | ORAL_TABLET | Freq: Four times a day (QID) | ORAL | 1 refills | Status: DC | PRN

## 2024-01-29 ENCOUNTER — Ambulatory Visit (INDEPENDENT_AMBULATORY_CARE_PROVIDER_SITE_OTHER): Payer: 59 | Admitting: Podiatry

## 2024-01-29 ENCOUNTER — Ambulatory Visit (INDEPENDENT_AMBULATORY_CARE_PROVIDER_SITE_OTHER)

## 2024-01-29 DIAGNOSIS — Z9889 Other specified postprocedural states: Secondary | ICD-10-CM

## 2024-01-29 DIAGNOSIS — T85848A Pain due to other internal prosthetic devices, implants and grafts, initial encounter: Secondary | ICD-10-CM

## 2024-01-29 MED ORDER — GABAPENTIN 300 MG PO CAPS: 300.0000 mg | ORAL_CAPSULE | Freq: Three times a day (TID) | ORAL | 3 refills | Status: DC

## 2024-01-29 NOTE — Progress Notes (Signed)
 Subjective:  Patient ID: Meghan [name], female    DOB: 12-Aug-1966,  MRN: [id number]  Chief Complaint  Patient presents with   Routine Post Op    POV # 1 DOS 01/20/24 --- LEFT FOOT REMOVAL OF PAINFUL HARDWARE  Pt stated that she has sharp intense pain    DOS: 01/20/24 Procedure: Left removal of painful hardware  58 y.o. female returns for post-op check.  Patient states she is doing well minimal pain some sharp shooting pain.  No dull achiness.  Weightbearing as tolerated with Cam boot  Review of Systems: Negative except as noted in the HPI. Denies N/V/F/Ch.  Past Medical History:  Diagnosis Date   (HFpEF) heart failure with preserved ejection fraction (HCC) 01/2019   a.) Dx'd in [place]; b.) TTE 08/14/2022: EF 60-65%, mod LVH, mild LAE, triv MR   Anxiety    Asthma    Atrial fibrillation and flutter (HCC) 02/16/2019   a.) CHA2DS2VASc = 6 (sex, HFpEF, HTN, CVA x2, T2DM);  b.) s/p DCCV (200 J x 1) 08/22/2022; c.) s/p RF ablation (CTI) 11/02/2022; d.) rate/rhythm maintained on oral flecainide  + metoprolol  succinate; chronically anticoagulated with rivaroxaban    Class 3 severe obesity due to excess calories with body mass index (BMI) of 50.0 to 59.9 in adult    DDD (degenerative disc disease), lumbar    GERD (gastroesophageal reflux disease)    Hiatal hernia    History of head injury    HTN (hypertension)    Hypercholesterolemia    Ileus (HCC)    Intractable nausea and vomiting 11/16/2020   Long term current use of anticoagulant    a.) rivaroxaban    Macular degeneration of left eye    MDD (major depressive disorder)    OA (osteoarthritis)    OSA on CPAP    Pneumonia    Presence of IVC filter    PTSD (post-traumatic stress disorder)    Recurrent pulmonary emboli (HCC)    a.) s/p IVC filter placement 2003; b.) previously followed by hematology when living in [place]   Seizure disorder (HCC)    a.) last was in 2020 per pt report   Stroke (HCC)    1989 and 1995 (left  sided weakness)   T2DM (type 2 diabetes mellitus) (HCC)    Vitamin D deficiency     Current Outpatient Medications:    amitriptyline (ELAVIL) 10 MG tablet, Take 10 mg by mouth at bedtime., Disp: , Rfl:    cyclobenzaprine  (FLEXERIL ) 10 MG tablet, Take 10 mg by mouth., Disp: , Rfl:    gabapentin  (NEURONTIN ) 300 MG capsule, Take 1 capsule (300 mg total) by mouth 3 (three) times daily., Disp: 90 capsule, Rfl: 3   oxyCODONE -acetaminophen  (PERCOCET) 5-325 MG tablet, Take 1 tablet by mouth every 4 (four) hours as needed for severe pain (pain score 7-10)., Disp: 30 tablet, Rfl: 0   albuterol  (VENTOLIN  HFA) 108 (90 Base) MCG/ACT inhaler, Inhale 2 puffs into the lungs every 6 (six) hours as needed for wheezing or shortness of breath., Disp: , Rfl:    budesonide-formoterol  (SYMBICORT) 80-4.5 MCG/ACT inhaler, Inhale 2 puffs into the lungs., Disp: , Rfl:    bumetanide  (BUMEX ) 1 MG tablet, Take 1 tablet (1 mg total) by mouth daily for 10 days., Disp: 10 tablet, Rfl: 0   Cholecalciferol (VITAMIN D-3) 125 MCG (5000 UT) TABS, Take 5,000 Units by mouth daily., Disp: , Rfl:    metFORMIN  (GLUCOPHAGE ) 500 MG tablet, Take 500 mg by mouth daily with breakfast., Disp: ,  Rfl:    MOUNJARO 7.5 MG/0.5ML Pen, Inject 7.5 mg into the skin once a week., Disp: , Rfl:    Omega-3 Fatty Acids (FISH OIL) 1200 MG CAPS, Take 1,200 mg by mouth daily with breakfast., Disp: , Rfl:    omeprazole (PRILOSEC) 40 MG capsule, Take 40 mg by mouth 2 (two) times daily., Disp: , Rfl:    oxyCODONE -acetaminophen  (PERCOCET/ROXICET) 5-325 MG tablet, Take 1 tablet by mouth 3 (three) times daily as needed., Disp: , Rfl:    rivaroxaban  (XARELTO ) 20 MG TABS tablet, Take 20 mg by mouth at bedtime. , Disp: , Rfl:    rosuvastatin (CRESTOR) 10 MG tablet, Take 10 mg by mouth at bedtime., Disp: , Rfl:    sertraline  (ZOLOFT ) 50 MG tablet, Take 50 mg by mouth in the morning., Disp: , Rfl:    zolpidem  (AMBIEN  CR) 6.25 MG CR tablet, Take 1 tablet (6.25 mg total)  by mouth at bedtime., Disp: 30 tablet, Rfl: 2  Social History   Tobacco Use  Smoking Status Never  Smokeless Tobacco Never    Allergies  Allergen Reactions   Acetaminophen -Codeine Swelling, Rash and Other (See Comments)    Tylenol  with Codeine, Tylenol  #3,  (facial swelling, hives)   Bee Venom Anaphylaxis   Meloxicam Anaphylaxis and Rash   Tramadol Hives and Other (See Comments)    Patient stated "I was trippin' and I do not want that ever again"   Codeine Hives and Swelling   Coconut (Cocos Nucifera) Rash and Other (See Comments)    ANY coconut products    Tomato Rash   Objective:  There were no vitals filed for this visit. There is no height or weight on file to calculate BMI. Constitutional Well developed. Well nourished.  Vascular Foot warm and well perfused. Capillary refill normal to all digits.   Neurologic Normal speech. Oriented to person, place, and time. Epicritic sensation to light touch grossly present bilaterally.  Dermatologic Skin healing well without signs of infection. Skin edges well coapted without signs of infection.  Orthopedic: Tenderness to palpation noted about the surgical site.   Radiographs: 3 views of skeletally mature adult left foot: No further hardware noted retained wire noted.  No other abnormalities identified  1. Pain from implanted hardware, initial encounter    Plan:  Patient was evaluated and treated and all questions answered.  S/p foot surgery left -Progressing as expected post-operatively. -XR: See above -WB Status: Weightbearing as tolerated in surgical shoe -Sutures: Intact.  No clinical signs of Deis is no no complication noted. -Medications: None -Foot redressed. -I discussed with the patient that the reason behind leaving the hardware in his was that I was not able to remove it it would cause more damage than leaving the hardware in.  Patient agrees with the plan.   No follow-ups on file.

## 2024-01-30 ENCOUNTER — Ambulatory Visit (HOSPITAL_COMMUNITY): Admitting: Licensed Clinical Social Worker

## 2024-01-30 DIAGNOSIS — F331 Major depressive disorder, recurrent, moderate: Secondary | ICD-10-CM | POA: Diagnosis not present

## 2024-01-30 DIAGNOSIS — F4310 Post-traumatic stress disorder, unspecified: Secondary | ICD-10-CM | POA: Diagnosis not present

## 2024-01-30 DIAGNOSIS — F411 Generalized anxiety disorder: Secondary | ICD-10-CM | POA: Diagnosis not present

## 2024-01-30 NOTE — Progress Notes (Signed)
 Virtual Visit via Video Note   I connected with Aijah S. Erlandson on 01/30/24 at 1:00pm by video enabled telemedicine application and verified that I am speaking with the correct person using two identifiers.   I discussed the limitations, risks, security and privacy concerns of performing an evaluation and management service by video and the availability of in person appointments. I also discussed with the patient that there may be a patient responsible charge related to this service. The patient expressed understanding and agreed to proceed.   I discussed the assessment and treatment plan with the patient. The patient was provided an opportunity to ask questions and all were answered. The patient agreed with the plan and demonstrated an understanding of the instructions.   The patient was advised to call back or seek an in-person evaluation if the symptoms worsen or if the condition fails to improve as anticipated.   I provided 32 minutes of non-face-to-face time during this encounter.   Desmond Florida, LCSW, LCAS _______________________ THERAPIST PROGRESS NOTE   Session Time:  1:00pm - 1:32pm   Location: Patient: Patient Home Provider: [facility]    Participation Level: Active    Behavioral Response: Alert, casually dressed, euthymic mood/affect   Type of Therapy:  Individual Therapy   Treatment Goals addressed: Mood management; Medication management  Progress Towards Goals: Progressing   Interventions: CBT: guided imagery    Summary: Shawnie S. Camerer is a 58 year old divorced African American female that presented for therapy appointment today with diagnoses of Major Depressive Disorder, recurrent, moderate, Generalized Anxiety Disorder and PTSD.      Suicidal/Homicidal: None; without plan or intent                                                                                                                Therapist Response:  Clinician met with Evah for virtual therapy  appointment and assessed for safety, sobriety, and medication compliance.  Marnae presented for session on time and was alert, oriented x5, with no evidence or self-report of active SI/HI or A/V H.  Zaire reported ongoing compliance with medication and denied any use of alcohol or illicit substances.  Clinician inquired about Madgie's current emotional ratings, as well as any significant changes in thoughts, feelings, or behavior since previous check-in.  Saryn reported scores of 0/10 for depression, 0/10 for anxiety, and 0/10 for anger/irritability.  Sigrid denied any recent outbursts.  Khaila reported that a recent struggle was waking up early this morning and having a panic attack when she discovered a large amount of blood in her bed.  Carliyah reported that she called a friend, who brought her to the hospital, where they discovered her stiches had come loose.  Aynslee stated "My doctor was already at the hospital, and redid the stiches.  They also gave me some medicine".  Akacia reported that she was feeling very tired since she didn't get back home until much later, and asked to have a shorter session so that she could rest.  Clinician  was agreeable to this request, and suggested practicing guided imagery exercise in session today in order to help Dezeray relax after recent stressor.  Clinician guided Olivene through process of getting comfortable, achieving relaxing breathing pattern, and then beginning to visualize a walk through a pine forest on a sunny day, including various pleasant sensory details throughout 10 minute exercise in order to increase effectiveness.  Clinician inquired about outcome of activity afterward, and whether Alaysa would consider adding this to her self-care routine.  Intervention was effective, as evidenced by [facility] completing activity successfully and reporting that she did end up feeling more relaxed, and would be open to using this as a new coping activity.  She reported that she imagined a  walk in the park with her sister from the past, and was able to stay focused throughout the exercise.  Clinician will continue to monitor.          Plan: Follow up again in 1-2 weeks.    Diagnosis: Major depressive disorder, recurrent, moderate; Generalized Anxiety Disorder; and PTSD   Collaboration of Care:   No collaboration of care required for this visit.                                                   Patient/Guardian was advised Release of Information must be obtained prior to any record release in order to collaborate their care with an outside provider. Patient/Guardian was advised if they have not already done so to contact the registration department to sign all necessary forms in order for us  to release information regarding their care.    Consent: Patient/Guardian gives verbal consent for treatment and assignment of benefits for services provided during this visit. Patient/Guardian expressed understanding and agreed to proceed.  Desmond Florida, LCSW, LCAS 01/30/24

## 2024-02-05 ENCOUNTER — Ambulatory Visit (INDEPENDENT_AMBULATORY_CARE_PROVIDER_SITE_OTHER): Admitting: Licensed Clinical Social Worker

## 2024-02-05 DIAGNOSIS — F4310 Post-traumatic stress disorder, unspecified: Secondary | ICD-10-CM

## 2024-02-05 DIAGNOSIS — F411 Generalized anxiety disorder: Secondary | ICD-10-CM | POA: Diagnosis not present

## 2024-02-05 DIAGNOSIS — F331 Major depressive disorder, recurrent, moderate: Secondary | ICD-10-CM

## 2024-02-05 NOTE — Progress Notes (Addendum)
 Virtual Visit via Video Note   I connected with Meghan [name] on 02/05/24 at 2:00pm by video enabled telemedicine application and verified that I am speaking with the correct person using two identifiers.   I discussed the limitations, risks, security and privacy concerns of performing an evaluation and management service by video and the availability of in person appointments. I also discussed with the patient that there may be a patient responsible charge related to this service. The patient expressed understanding and agreed to proceed.   I discussed the assessment and treatment plan with the patient. The patient was provided an opportunity to ask questions and all were answered. The patient agreed with the plan and demonstrated an understanding of the instructions.   The patient was advised to call back or seek an in-person evaluation if the symptoms worsen or if the condition fails to improve as anticipated.   I provided 34 minutes of non-face-to-face time during this encounter.   Meghan Florida, LCSW, LCAS _______________________ THERAPIST PROGRESS NOTE   Session Time:  2:00pm - 2:34pm  Location: Patient: Patient Home Provider: Home Office    Participation Level: Active    Behavioral Response: Alert, casually dressed, euthymic mood/affect   Type of Therapy:  Individual Therapy   Treatment Goals addressed: Mood management; Medication management; Establishing healthier boundaries with family   Progress Towards Goals: Progressing   Interventions: CBT, psychoeducation on gaslighting   Summary: Meghan [name] is a 58 year old divorced African American female that presented for therapy appointment today with diagnoses of Major Depressive Disorder, recurrent, moderate, Generalized Anxiety Disorder and PTSD.      Suicidal/Homicidal: None; without plan or intent                                                                                                                Therapist  Response:  Clinician met with Meghan [name] for virtual therapy session and assessed for safety, sobriety, and medication compliance.  Meghan [name] presented for appointment on time and was alert, oriented x5, with no evidence or self-report of active SI/HI or A/Meghan H.  Meghan [name] reported ongoing compliance with medication and denied any use of alcohol or illicit substances.  Clinician inquired about Meghan [name] emotional ratings today, as well as any significant changes in thoughts, feelings, or behavior since last check-in.  Meghan [name] reported scores of 0/10 for depression, 0/10 for anxiety, and 0/10 for anger/irritability.  Meghan [name] denied any recent outbursts.  Meghan [name] reported that a recent success was getting some dental work done, which went well. She reported that a struggle has been having to reaffirm boundaries with one of her daughters, who has been spreading lies to friends, and then denying it afterward, stating "She lies to them and says I said this or that".  Clinician assisted Meghan [name] by covering a handout on subject of gaslighting, which defined this as a form of manipulation that causes a person to doubt their own beliefs, sanity or memory.  Clinician covered several common examples of gaslighting tactics, such as denial,  distraction, avoidance, projection, minimization, threatening, sabotage, and more.  Clinician tasked Meghan [name] with identifying which behaviors she has observed family members like her daughter engage in, and healthy strategies that could be explored to extinguish 'toxic' behavior, improve communication and connectedness with family.  Intervention was effective, as evidenced by Meghan [name] actively participating in discussion on subject, and identifying several gaslighting behaviors that family have used against her both recently and in the past, such as denial, distraction, ignoring, and trivialization.  Meghan [name] reported that one of her daughters is most guilty of these behaviors, and this reaffirmed why she moved from [place] to  [place] in the first place.  Meghan [name] reported that she will limit communication with her daughter until she begins to own up to the deceit, and spend more time around supports that treat her with respect.  Meghan [name] stated "I am better prepared now.  I'd heard the term gaslighting but didn't know what it entailed.  That's just half the battle".  Clinician will continue to monitor.          Plan: Follow up again in 1-2 weeks.    Diagnosis: Major depressive disorder, recurrent, moderate; Generalized Anxiety Disorder; and PTSD   Collaboration of Care:   No collaboration of care required for this visit.                                                   Patient/Guardian was advised Release of Information must be obtained prior to any record release in order to collaborate their care with an outside provider. Patient/Guardian was advised if they have not already done so to contact the registration department to sign all necessary forms in order for us  to release information regarding their care.    Consent: Patient/Guardian gives verbal consent for treatment and assignment of benefits for services provided during this visit. Patient/Guardian expressed understanding and agreed to proceed.  Meghan Florida, LCSW, LCAS 02/05/24

## 2024-02-11 ENCOUNTER — Ambulatory Visit (HOSPITAL_COMMUNITY): Admitting: Licensed Clinical Social Worker

## 2024-02-11 DIAGNOSIS — F411 Generalized anxiety disorder: Secondary | ICD-10-CM | POA: Diagnosis not present

## 2024-02-11 DIAGNOSIS — F331 Major depressive disorder, recurrent, moderate: Secondary | ICD-10-CM

## 2024-02-11 DIAGNOSIS — F4310 Post-traumatic stress disorder, unspecified: Secondary | ICD-10-CM
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
## 2024-02-13 ENCOUNTER — Ambulatory Visit (INDEPENDENT_AMBULATORY_CARE_PROVIDER_SITE_OTHER): Payer: 59 | Admitting: Podiatry

## 2024-02-13 DIAGNOSIS — T85848A Pain due to other internal prosthetic devices, implants and grafts, initial encounter: Secondary | ICD-10-CM

## 2024-02-13 DIAGNOSIS — Z9889 Other specified postprocedural states: Secondary | ICD-10-CM

## 2024-02-13 MED ORDER — OXYCODONE-ACETAMINOPHEN 5-325 MG PO TABS: 1.0000 | ORAL_TABLET | ORAL | 0 refills | Status: DC | PRN

## 2024-02-13 NOTE — Progress Notes (Signed)
 Subjective:  Patient ID: Meghan [name], female    DOB: [birth year],  MRN: [id number]  Chief Complaint  Patient presents with   Post-op Follow-up    Surgical site intact, sutures in place. Patient reports pain level is still the same. Some improvement compared to before surgery. Gabapentin  and Tylenol  arthritis are not helping.     DOS: 01/20/24 Procedure: Left removal of painful hardware  58 y.o. female returns for post-op check.  Patient  still having some pain, needs refill of pain med. Denies any issue with dressing or bleeding. Wearing post op shoe.   Review of Systems: Negative except as noted in the HPI. Denies N/V/F/Ch.  Past Medical History:  Diagnosis Date   (HFpEF) heart failure with preserved ejection fraction (HCC) 01/2019   a.) Dx'd in [place] , [place]; b.) TTE 08/14/2022: EF 60-65%, mod LVH, mild LAE, triv MR   Anxiety    Asthma    Atrial fibrillation and flutter (HCC) 02/16/2019   a.) CHA2DS2VASc = 6 (sex, HFpEF, HTN, CVA x2, T2DM);  b.) s/p DCCV (200 J x 1) 08/22/2022; c.) s/p RF ablation (CTI) 11/02/2022; d.) rate/rhythm maintained on oral flecainide  + metoprolol  succinate; chronically anticoagulated with rivaroxaban    Class 3 severe obesity due to excess calories with body mass index (BMI) of 50.0 to 59.9 in adult    DDD (degenerative disc disease), lumbar    GERD (gastroesophageal reflux disease)    Hiatal hernia    History of head injury    HTN (hypertension)    Hypercholesterolemia    Ileus (HCC)    Intractable nausea and vomiting 11/16/2020   Long term current use of anticoagulant    a.) rivaroxaban    Macular degeneration of left eye    MDD (major depressive disorder)    OA (osteoarthritis)    OSA on CPAP    Pneumonia    Presence of IVC filter    PTSD (post-traumatic stress disorder)    Recurrent pulmonary emboli (HCC)    a.) s/p IVC filter placement 2003; b.) previously followed by hematology when living in [place] , [place]   Seizure disorder (HCC)    a.)  last was in 2020 per pt report   Stroke (HCC)    1989 and 1995 (left sided weakness)   T2DM (type 2 diabetes mellitus) (HCC)    Vitamin D deficiency     Current Outpatient Medications:    albuterol  (VENTOLIN  HFA) 108 (90 Base) MCG/ACT inhaler, Inhale 2 puffs into the lungs every 6 (six) hours as needed for wheezing or shortness of breath., Disp: , Rfl:    amitriptyline (ELAVIL) 10 MG tablet, Take 10 mg by mouth at bedtime., Disp: , Rfl:    budesonide-formoterol  (SYMBICORT) 80-4.5 MCG/ACT inhaler, Inhale 2 puffs into the lungs., Disp: , Rfl:    Cholecalciferol (VITAMIN D-3) 125 MCG (5000 UT) TABS, Take 5,000 Units by mouth daily., Disp: , Rfl:    cyclobenzaprine  (FLEXERIL ) 10 MG tablet, Take 10 mg by mouth., Disp: , Rfl:    gabapentin  (NEURONTIN ) 300 MG capsule, Take 1 capsule (300 mg total) by mouth 3 (three) times daily., Disp: 90 capsule, Rfl: 3   metFORMIN  (GLUCOPHAGE ) 500 MG tablet, Take 500 mg by mouth daily with breakfast., Disp: , Rfl:    MOUNJARO 7.5 MG/0.5ML Pen, Inject 7.5 mg into the skin once a week., Disp: , Rfl:    Omega-3 Fatty Acids (FISH OIL) 1200 MG CAPS, Take 1,200 mg by mouth daily with breakfast., Disp: , Rfl:    omeprazole (  PRILOSEC) 40 MG capsule, Take 40 mg by mouth 2 (two) times daily., Disp: , Rfl:    oxyCODONE -acetaminophen  (PERCOCET) 5-325 MG tablet, Take 1 tablet by mouth every 4 (four) hours as needed for severe pain (pain score 7-10)., Disp: 20 tablet, Rfl: 0   rivaroxaban  (XARELTO ) 20 MG TABS tablet, Take 20 mg by mouth at bedtime. , Disp: , Rfl:    rosuvastatin (CRESTOR) 10 MG tablet, Take 10 mg by mouth at bedtime., Disp: , Rfl:    sertraline  (ZOLOFT ) 50 MG tablet, Take 50 mg by mouth in the morning., Disp: , Rfl:    zolpidem  (AMBIEN  CR) 6.25 MG CR tablet, Take 1 tablet (6.25 mg total) by mouth at bedtime., Disp: 30 tablet, Rfl: 2   bumetanide  (BUMEX ) 1 MG tablet, Take 1 tablet (1 mg total) by mouth daily for 10 days., Disp: 10 tablet, Rfl: 0  Social History    Tobacco Use  Smoking Status Never  Smokeless Tobacco Never    Allergies  Allergen Reactions   Acetaminophen -Codeine Swelling, Rash and Other (See Comments)    Tylenol  with Codeine, Tylenol  #3,  (facial swelling, hives)   Bee Venom Anaphylaxis   Meloxicam Anaphylaxis and Rash   Tramadol Hives and Other (See Comments)    Patient stated "I was trippin' and I do not want that ever again"   Codeine Hives and Swelling   Coconut (Cocos Nucifera) Rash and Other (See Comments)    ANY coconut products    Tomato Rash   Objective:  There were no vitals filed for this visit. There is no height or weight on file to calculate BMI. Constitutional Well developed. Well nourished.  Vascular Foot warm and well perfused. Capillary refill normal to all digits.   Neurologic Normal speech. Oriented to person, place, and time. Epicritic sensation to light touch grossly present bilaterally.  Dermatologic Skin healing well without signs of infection. Skin edges well coapted without signs of infection.  Orthopedic: Tenderness to palpation noted about the surgical site.   Radiographs: Deferred today  1. Pain from implanted hardware, initial encounter   2. Post-operative state     Plan:  Patient was evaluated and treated and all questions answered.  S/p foot surgery left -Progressing as expected post-operatively. Incision healing well -XR: See above -WB Status: Weightbearing as tolerated in surgical shoe -Sutures: Removed in total today -Medications: Refill of percocet sent -Ok to wash foot at this time, wear ace wrap for protection / edema control - Follow up in 3 weeks with Dr. Lydia Sams for POV   No follow-ups on file.

## 2024-02-17 ENCOUNTER — Ambulatory Visit (HOSPITAL_COMMUNITY): Payer: Self-pay | Admitting: Licensed Clinical Social Worker

## 2024-02-17 ENCOUNTER — Encounter (HOSPITAL_COMMUNITY): Payer: Self-pay

## 2024-02-17 ENCOUNTER — Telehealth (INDEPENDENT_AMBULATORY_CARE_PROVIDER_SITE_OTHER): Payer: Self-pay | Admitting: Licensed Clinical Social Worker

## 2024-02-17 NOTE — Telephone Encounter (Signed)
 Mesha had a virtual appointment scheduled today for 2pm.  Clinician outreached F[place] by phone at 2:12pm when she had not presented on time.  This call went to voicemail, so clinician left a message reminding her of appointment, and included contact number for front desk to return call.  Clinician informed front desk staff of no show event when Laquanda did not present by 2:15pm.  Desmond Florida, LCSW, LCAS 02/17/24

## 2024-02-18 ENCOUNTER — Ambulatory Visit (INDEPENDENT_AMBULATORY_CARE_PROVIDER_SITE_OTHER): Admitting: Licensed Clinical Social Worker

## 2024-02-18 DIAGNOSIS — F411 Generalized anxiety disorder: Secondary | ICD-10-CM | POA: Diagnosis not present

## 2024-02-18 DIAGNOSIS — F331 Major depressive disorder, recurrent, moderate: Secondary | ICD-10-CM | POA: Diagnosis not present

## 2024-02-18 DIAGNOSIS — F4310 Post-traumatic stress disorder, unspecified: Secondary | ICD-10-CM

## 2024-02-18 NOTE — Progress Notes (Signed)
 Virtual Visit via Video Note   I connected with Harveen S. Kuenzi on 02/18/24 at 1:00pm by video enabled telemedicine application and verified that I am speaking with the correct person using two identifiers.   I discussed the limitations, risks, security and privacy concerns of performing an evaluation and management service by video and the availability of in person appointments. I also discussed with the patient that there may be a patient responsible charge related to this service. The patient expressed understanding and agreed to proceed.   I discussed the assessment and treatment plan with the patient. The patient was provided an opportunity to ask questions and all were answered. The patient agreed with the plan and demonstrated an understanding of the instructions.   The patient was advised to call back or seek an in-person evaluation if the symptoms worsen or if the condition fails to improve as anticipated.   I provided 40 minutes of non-face-to-face time during this encounter.   Desmond Florida, LCSW, LCAS _______________________ THERAPIST PROGRESS NOTE   Session Time:  1:00pm - 1:40pm   Location: Patient: Patient Home Provider: Home Office    Participation Level: Active    Behavioral Response: Alert, casually dressed, euthymic mood/affect   Type of Therapy:  Individual Therapy   Treatment Goals addressed: Mood management; Medication management; Establishing healthier boundaries   Progress Towards Goals: Progressing   Interventions: CBT, psychoeducation on healthy relationships    Summary: Jannis S. [name] is a 58 year old divorced African American female that presented for therapy appointment today with diagnoses of Major Depressive Disorder, recurrent, moderate, Generalized Anxiety Disorder and PTSD.      Suicidal/Homicidal: None; without plan or intent                                                                                                                Therapist  Response:  Clinician met with Neida for virtual therapy appointment and assessed for safety, sobriety, and medication compliance.  Prajna presented for session on time and was alert, oriented x5, with no evidence or self-report of active SI/HI or A/V H.  Maizy reported ongoing compliance with medication and denied any use of alcohol or illicit substances.  Clinician inquired about Yong's current emotional ratings, as well as any significant changes in thoughts, feelings, or behavior since previous check-in.  Makenly reported scores of 0/10 for depression, 0/10 for anxiety, and 0/10 for anger/irritability.  Dicie denied any recent outbursts or panic attacks.  Katalyn reported that a recent struggle was missing her appointment yesterday due to a dental appointment.  Tameisha reported that a recent success was also learning that her daughter and grandson will be moving to her city soon, and won't be too far from her.  Raelan reported that she has changed her mind on family moving closer, and is looking forward to it now.  Khya reported that at church the other day, she met a new man and felt a 'spark' between them, but found herself scared about exploring a relationship due to  bad past experiences.  Clinician utilized a handout today with Desa today focused on effectively differentiating between healthy versus unhealthy relationships.  This handout explained how no relationship is perfect, but certain characteristics can help gauge the overall quality of a support (I.e. respect versus disrespect, trust versus jealousy, honesty versus betrayal, etc).  Clinician inquired about which qualities have been present in the developing friendship thus far, as well as any red flags which could indicate need for firmer boundaries due to perceived risk towards her mental health and wellbeing.  Intervention was effective, as evidenced by Leighton Punches actively engaging in discussion on subject, reporting that there are more green lights than red  flags so far in the developing friendship.  Haani reported that this person appears to be respectful, clear with communication, patient, and respectful of her boundaries.  Ndea reported that she will plan to be patient and take her time as she continues to learn more about this person, stating "I need to take things slow".  She reported that she had not slept well last night, and asked to end session early.  Clinician will continue to monitor.          Plan: Follow up again in 1-2 weeks.    Diagnosis: Major depressive disorder, recurrent, moderate; Generalized Anxiety Disorder; and PTSD   Collaboration of Care:   No collaboration of care required for this visit.                                                   Patient/Guardian was advised Release of Information must be obtained prior to any record release in order to collaborate their care with an outside provider. Patient/Guardian was advised if they have not already done so to contact the registration department to sign all necessary forms in order for us  to release information regarding their care.    Consent: Patient/Guardian gives verbal consent for treatment and assignment of benefits for services provided during this visit. Patient/Guardian expressed understanding and agreed to proceed.  Desmond Florida, LCSW, LCAS 02/18/24

## 2024-02-19 ENCOUNTER — Emergency Department (HOSPITAL_COMMUNITY)
Admission: EM | Admit: 2024-02-19 | Discharge: 2024-02-19 | Disposition: A | Discharge status: Home / Self Care | Attending: Emergency Medicine | Admitting: Emergency Medicine

## 2024-02-19 ENCOUNTER — Other Ambulatory Visit: Payer: Self-pay

## 2024-02-19 DIAGNOSIS — J45909 Unspecified asthma, uncomplicated: Secondary | ICD-10-CM | POA: Insufficient documentation

## 2024-02-19 DIAGNOSIS — I5030 Unspecified diastolic (congestive) heart failure: Secondary | ICD-10-CM | POA: Insufficient documentation

## 2024-02-19 DIAGNOSIS — M5416 Radiculopathy, lumbar region: Secondary | ICD-10-CM | POA: Diagnosis not present

## 2024-02-19 DIAGNOSIS — Z7901 Long term (current) use of anticoagulants: Secondary | ICD-10-CM | POA: Diagnosis not present

## 2024-02-19 DIAGNOSIS — M545 Low back pain, unspecified: Secondary | ICD-10-CM | POA: Diagnosis present

## 2024-02-19 MED ORDER — OXYCODONE HCL 5 MG PO TABS: 5.0000 mg | ORAL_TABLET | ORAL | 0 refills | Status: AC | PRN

## 2024-02-19 MED ORDER — CYCLOBENZAPRINE HCL 10 MG PO TABS
10.0000 mg | ORAL_TABLET | Freq: Once | ORAL | Status: AC
  Administered 2024-02-19: 10 mg via ORAL
  Filled 2024-02-19: qty 1

## 2024-02-19 MED ORDER — OXYCODONE HCL 5 MG PO TABS
10.0000 mg | ORAL_TABLET | Freq: Once | ORAL | Status: AC
  Administered 2024-02-19: 10 mg via ORAL
  Filled 2024-02-19: qty 2

## 2024-02-19 MED ORDER — CYCLOBENZAPRINE HCL 10 MG PO TABS: 10.0000 mg | ORAL_TABLET | Freq: Two times a day (BID) | ORAL | 0 refills | Status: DC | PRN

## 2024-02-19 MED ORDER — LIDOCAINE 5 % EX PTCH: 1.0000 | MEDICATED_PATCH | CUTANEOUS | 0 refills | Status: DC

## 2024-02-19 MED ORDER — LIDOCAINE 5 % EX PTCH
2.0000 | MEDICATED_PATCH | CUTANEOUS | Status: DC
  Administered 2024-02-19: 2 via TRANSDERMAL
  Filled 2024-02-19: qty 2

## 2024-02-19 NOTE — Discharge Instructions (Signed)
 Return to ED if you experience urinary incontinence/pee on yourself or loss sensation in genitals or extremities or weakness in extremitys

## 2024-02-19 NOTE — ED Triage Notes (Addendum)
 BIBA from church for lower back pain radiates down both legs since yesterday- no known injury 160 palp. 57 HR 100% room air 104 cbg

## 2024-02-19 NOTE — ED Notes (Signed)
 Pt noted to be calling out in pain. She stated the pain was in her back and radiating down her L leg.

## 2024-02-19 NOTE — ED Provider Notes (Signed)
  EMERGENCY DEPARTMENT AT [facility] Provider Note   CSN: [id number] Arrival date & time: 02/19/24  1411     History  Chief Complaint  Patient presents with   Back Pain    Meghan [name] is a 58 y.o. female with a past medical history of A-fib, PE, OSA, asthma, HFpEF presents emergency department for evaluation of lower back pain that started 2 days ago.  Pain radiates from lower back into posterior aspects of both legs. Pain described as "jolts" down legs. No weakness, paresthesia of legs.  Has a history of spondylolysis and was evaluated at urgent care in February 2025.  She was recommended to follow-up with orthopedics for which she has an appointment with next week.  No history of IVDU, malignancy, kidney stones.  Denies urinary incontinence, saddle paresthesia, urinary symptoms, urinary incontinence, fever, recent falls.  Of note, follows orthopedics for suspected MRI on 02/21/2024 in 2 days.   Back Pain      Home Medications Prior to Admission medications   Medication Sig Start Date End Date Taking? Authorizing Provider  cyclobenzaprine  (FLEXERIL ) 10 MG tablet Take 1 tablet (10 mg total) by mouth 2 (two) times daily as needed for muscle spasms. 02/19/24  Yes Royann Cords, PA  lidocaine  (LIDODERM ) 5 % Place 1 patch onto the skin daily. Remove & Discard patch within 12 hours or as directed by MD 02/19/24  Yes Royann Cords, PA  oxyCODONE  (ROXICODONE ) 5 MG immediate release tablet Take 1 tablet (5 mg total) by mouth every 4 (four) hours as needed for up to 3 days for severe pain (pain score 7-10). 02/19/24 02/22/24 Yes Royann Cords, PA  albuterol  (VENTOLIN  HFA) 108 (90 Base) MCG/ACT inhaler Inhale 2 puffs into the lungs every 6 (six) hours as needed for wheezing or shortness of breath.    [provider]  amitriptyline (ELAVIL) 10 MG tablet Take 10 mg by mouth at bedtime. 01/01/24   [provider]  budesonide-formoterol  (SYMBICORT) 80-4.5 MCG/ACT  inhaler Inhale 2 puffs into the lungs.    [provider]  bumetanide  (BUMEX ) 1 MG tablet Take 1 tablet (1 mg total) by mouth daily for 10 days. 12/25/22 11/08/23  Reena Canning, NP  Cholecalciferol (VITAMIN D-3) 125 MCG (5000 UT) TABS Take 5,000 Units by mouth daily.    [provider]  cyclobenzaprine  (FLEXERIL ) 10 MG tablet Take 10 mg by mouth. 02/26/20   [provider]  gabapentin  (NEURONTIN ) 300 MG capsule Take 1 capsule (300 mg total) by mouth 3 (three) times daily. 01/29/24   Velma Ghazi, DPM  metFORMIN  (GLUCOPHAGE ) 500 MG tablet Take 500 mg by mouth daily with breakfast.    [provider]  MOUNJARO 7.5 MG/0.5ML Pen Inject 7.5 mg into the skin once a week. 09/04/23   [provider]  Omega-3 Fatty Acids (FISH OIL) 1200 MG CAPS Take 1,200 mg by mouth daily with breakfast.    [provider]  omeprazole (PRILOSEC) 40 MG capsule Take 40 mg by mouth 2 (two) times daily. 08/17/20   [provider]  oxyCODONE -acetaminophen  (PERCOCET) 5-325 MG tablet Take 1 tablet by mouth every 4 (four) hours as needed for severe pain (pain score 7-10). 02/13/24   Standiford, Karlene Overcast, DPM  rivaroxaban  (XARELTO ) 20 MG TABS tablet Take 20 mg by mouth at bedtime.     [provider]  rosuvastatin (CRESTOR) 10 MG tablet Take 10 mg by mouth at bedtime. 07/10/23   [provider]  sertraline  (ZOLOFT ) 50 MG tablet Take 50 mg by mouth in the morning. 01/01/21   [provider]  zolpidem  (AMBIEN  CR) 6.25 MG CR tablet Take 1 tablet (6.25 mg total) by mouth at bedtime. 12/25/23   Margaretann Sharper, MD      Allergies    Acetaminophen -codeine, Bee venom, Meloxicam, Tramadol, Codeine, Coconut (cocos nucifera), and Tomato    Review of Systems   Review of Systems  Musculoskeletal:  Positive for back pain.    Physical Exam Updated Vital Signs BP 126/84   Pulse 72   Temp 98.8 F (37.1 C) (Oral)   Resp 20   SpO2 100%  Physical  Exam Vitals and nursing note reviewed.  Constitutional:      General: She is not in acute distress.    Appearance: Normal appearance.  HENT:     Head: Normocephalic and atraumatic.  Eyes:     Conjunctiva/sclera: Conjunctivae normal.  Cardiovascular:     Rate and Rhythm: Normal rate.  Pulmonary:     Effort: Pulmonary effort is normal. No respiratory distress.     Breath sounds: Normal breath sounds.  Musculoskeletal:     Cervical back: No tenderness, bony tenderness or crepitus. No pain with movement.     Thoracic back: No tenderness or bony tenderness. Normal range of motion.     Lumbar back: Tenderness and bony tenderness present. Positive right straight leg raise test and positive left straight leg raise test.     Right lower leg: No edema.     Left lower leg: No edema.  Skin:    Coloration: Skin is not jaundiced or pale.  Neurological:     Mental Status: She is alert and oriented to person, place, and time. Mental status is at baseline.     GCS: GCS eye subscore is 4. GCS verbal subscore is 5. GCS motor subscore is 6.     Sensory: No sensory deficit.     Motor: No weakness.     Coordination: Coordination normal.     Gait: Gait normal.     Comments: Motor 5/5 and sensation 2/2 of BLE. Able to ambulate wo difficulty     ED Results / Procedures / Treatments   Labs (all labs ordered are listed, but only abnormal results are displayed) Labs Reviewed - No data to display  EKG None  Radiology No results found.  Procedures Procedures    Medications Ordered in ED Medications  oxyCODONE  (Oxy IR/ROXICODONE ) immediate release tablet 10 mg (10 mg Oral Given 02/19/24 1548)  cyclobenzaprine  (FLEXERIL ) tablet 10 mg (10 mg Oral Given 02/19/24 1548)    ED Course/ Medical Decision Making/ A&P                                 Medical Decision Making Risk Prescription drug management.   Patient presents to the ED for concern of lower back pain, this involves an extensive  number of treatment options, and is a complaint that carries with it a high risk of complications and morbidity.  The differential diagnosis includes kidney stone, UTI, cauda equina, muscle spasm, traumatic injury of spine, DDD, lumbar radiculopathy   Co morbidities that complicate the patient evaluation  See HPI   Additional history obtained:  Additional history obtained from Nursing   External records from outside source obtained and reviewed including triage RN note, urgent care note    Medicines ordered and  prescription drug management:  I ordered medication including oxycodone , flexeril , lido patch  for back pain Reevaluation of the patient after these medicines showed that the patient improved I have reviewed the patients home medicines and have made adjustments as needed     Problem List / ED Course:  Lumbar radiculopathy Was evaluated in February 2025 for symptoms of back pain, left lower back pain.  X-ray at that time noted progressive L4/L5 spondylosis.  Today, she complains of neuropathy type pain that spine and radiates into posterior bilateral lower extremities.  She remains neurologically intact with sensation and motor of BLE intact.  She is tender to spine, process, left and right paraspinous musculature She denies IVDU, malignancy, urinary incontinence, saddle paresthesia also have low concern for cauda equina She denies urinary symptoms, history of kidney stones to have low suspicion for UTI, stone I offered to repeat imaging of her back however she declines at this time as she is going to follow-up with orthopedics in 2 days for MRI of her spine I also offered to obtain CBC, UA to ensure no CBC or UTI signs however she declines this as well as she is not having urinary symptoms no dysuria history of stones I provided analgesia and patient significantly improved.  She is able to ambulate without significant pain Provided analgesia and muscle relaxer prescription.  Discussed heat and rest for symptomatic care Discussed strict return precautions having her repeat the return precautions to include urinary incontinence, saddle paresthesia, worsening pain, urinary symptoms.  She expressed understanding and agrees Will have patient follow-up with orthopedics Friday for further management   Reevaluation:  After the interventions noted above, I reevaluated the patient and found that they have :improved     Dispostion:  After consideration of the diagnostic results and the patients response to treatment, I feel that the patent would benefit from outpatient imaging and orthopedic follow-up.   Discussed ED workup, disposition, return to ED precautions with patient who expresses understanding agrees with plan.  All questions answered to their satisfaction.  They are agreeable to plan.  Discharge instructions provided on paperwork  Final Clinical Impression(s) / ED Diagnoses Final diagnoses:  Lumbar radiculopathy    Rx / DC Orders ED Discharge Orders          Ordered    oxyCODONE  (ROXICODONE ) 5 MG immediate release tablet  Every 4 hours PRN        02/19/24 1654    cyclobenzaprine  (FLEXERIL ) 10 MG tablet  2 times daily PRN        02/19/24 1654    lidocaine  (LIDODERM ) 5 %  Every 24 hours        02/19/24 1654              Royann Cords, PA 02/19/24 2221    Albertus Hughs, DO 02/19/24 2306

## 2024-03-02 ENCOUNTER — Encounter (HOSPITAL_COMMUNITY): Payer: Self-pay

## 2024-03-02 ENCOUNTER — Ambulatory Visit (HOSPITAL_COMMUNITY): Admitting: Licensed Clinical Social Worker

## 2024-03-03 ENCOUNTER — Ambulatory Visit: Attending: Cardiovascular Disease | Admitting: Cardiovascular Disease

## 2024-03-03 ENCOUNTER — Encounter: Payer: Self-pay | Admitting: Cardiovascular Disease

## 2024-03-03 VITALS — BP 142/66 | HR 71 | Ht 65.0 in | Wt 301.0 lb

## 2024-03-03 DIAGNOSIS — I480 Paroxysmal atrial fibrillation: Secondary | ICD-10-CM

## 2024-03-03 DIAGNOSIS — I483 Typical atrial flutter: Secondary | ICD-10-CM | POA: Diagnosis not present

## 2024-03-03 NOTE — Progress Notes (Signed)
 Electrophysiology Office Note:    Date:  03/03/2024   ID:  Meghan [name], DOB 07-Apr-1966, MRN [id number]  PCP:  Janifer Meigs, FNP   [facility] Providers Cardiologist:  Bridgette Campus, MD (Inactive) Electrophysiologist:  Efraim Grange, MD     Referring MD: Janifer Meigs, *   History of Present Illness:    Meghan [name] is a 58 y.o. female with a hx listed below, significant for multiple PEs now s/p IVC filter, stroke, HF with recovered EF, AF referred for arrhythmia management. I reviewed Dr. Junita Oliva note from 07/19/22.  With the exception of one ECG in 2020 that shows atrial fibrillation, all other ECGs show typical atrial flutter. She has had several ER visits with atrial flutter since arriving to the area in 2020.   She underwent ablation of typical atrial flutter in February 2024.  There were no immediate postprocedural complications though she did develop a small right femoral vein hematoma days after.  She reports that since the ablation she has not had any symptoms to suggest recurrence of arrhythmia.   She has shortness of breath and fatigue, palpitations. No chest pain, syncope, pre-syncope.    EKGs/Labs/Other Studies Reviewed Today:     TTE 07/2022: Normal EF, mildly dilated LA  EKG:   EKG Interpretation Date/Time:  Tuesday March 03 2024 09:25:42 EDT Ventricular Rate:  71 PR Interval:  184 QRS Duration:  82 QT Interval:  374 QTC Calculation: 406 R Axis:   74  Text Interpretation: Normal sinus rhythm Normal ECG When compared with ECG of 30-Dec-2023 10:44, No significant change was found Confirmed by Marlane Silver [phone]) on 03/03/2024 9:39:46 AM     Recent Labs: 12/30/2023: ALT 17; BUN 15; Creatinine, Ser 0.80; Hemoglobin 12.6; Platelets 240; Potassium 4.0; Sodium 139     Physical Exam:    VS:  BP (!) 142/66 (BP Location: Right Arm, Patient Position: Sitting, Cuff Size: Large)   Pulse 71   Ht 5' 5 (1.651 m)   Wt (!) 301  lb (136.5 kg)   SpO2 98%   BMI 50.09 kg/m     Wt Readings from Last 3 Encounters:  03/03/24 (!) 301 lb (136.5 kg)  12/30/23 (!) 314 lb 8 oz (142.7 kg)  12/25/23 (!) 314 lb 12.8 oz (142.8 kg)     GEN:  Well nourished, well developed in no acute distress, obese CARDIAC: Irregular rhythm, no murmurs, rubs, gallops RESPIRATORY:  Normal work of breathing MUSCULOSKELETAL: 1+ edema    ASSESSMENT & PLAN:    Atrial flutter:  multiple recurrences resulting in hospitalization and CHF. Status post ablation of isthmus dependent counterclockwise atrial flutter, doing well  She has not had recurrence since ablation  Atrial fibrillation:  not documented on ECG since 2020. Will continue to monitor after AF ablation   - we discussed the importance of weight loss for maintenance of sinus rhythm.   - I do not see a great need to see her in EP clinic routinely at this point. I would hope and expect to see her again, however, if she develops AF in the future.  Longterm use of anticoagulation: history of recurrent PE and stroke. She will be on life-long anticoagulation        Medication Adjustments/Labs and Tests Ordered: Current medicines are reviewed at length with the patient today.  Concerns regarding medicines are outlined above.  Orders Placed This Encounter  Procedures   EKG 12-Lead   No orders of the defined  types were placed in this encounter.    Signed, Efraim Grange, MD  03/03/2024 9:40 AM    [facility]

## 2024-03-03 NOTE — Patient Instructions (Signed)
 Medication Instructions:  Your physician recommends that you continue on your current medications as directed. Please refer to the Current Medication list given to you today. *If you need a refill on your cardiac medications before your next appointment, please call your pharmacy*  Follow-Up: At [facility], you and your health needs are our priority.  As part of our continuing mission to provide you with exceptional heart care, our providers are all part of one team.  This team includes your primary Cardiologist (physician) and Advanced Practice Providers or APPs (Physician Assistants and Nurse Practitioners) who all work together to provide you with the care you need, when you need it.  Your next appointment:   Need to schedule an appointment with general cardiology (previous Dr. Alois Arnt patient)

## 2024-03-09 ENCOUNTER — Emergency Department (HOSPITAL_COMMUNITY)

## 2024-03-09 ENCOUNTER — Other Ambulatory Visit: Payer: Self-pay

## 2024-03-09 ENCOUNTER — Emergency Department (HOSPITAL_COMMUNITY)
Admission: EM | Admit: 2024-03-09 | Discharge: 2024-03-09 | Discharge status: Against Medical Advice | Attending: Emergency Medicine | Admitting: Emergency Medicine

## 2024-03-09 ENCOUNTER — Encounter (HOSPITAL_COMMUNITY): Payer: Self-pay | Admitting: Emergency Medicine

## 2024-03-09 DIAGNOSIS — R519 Headache, unspecified: Secondary | ICD-10-CM | POA: Diagnosis not present

## 2024-03-09 DIAGNOSIS — Z5321 Procedure and treatment not carried out due to patient leaving prior to being seen by health care provider: Secondary | ICD-10-CM | POA: Diagnosis not present

## 2024-03-09 DIAGNOSIS — R079 Chest pain, unspecified: Secondary | ICD-10-CM | POA: Diagnosis present

## 2024-03-09 LAB — BASIC METABOLIC PANEL WITH GFR
Anion gap: 11 (ref 5–15)
BUN: 12 mg/dL (ref 6–20)
CO2: 26 mmol/L (ref 22–32)
Calcium: 10.2 mg/dL (ref 8.9–10.3)
Chloride: 102 mmol/L (ref 98–111)
Creatinine, Ser: 0.93 mg/dL (ref 0.44–1.00)
GFR, Estimated: >60 mL/min (ref >60)
Glucose, Bld: 89 mg/dL (ref 70–99)
Potassium: 4.1 mmol/L (ref 3.5–5.1)
Sodium: 139 mmol/L (ref 135–145)

## 2024-03-09 LAB — CBC
HCT: 37.6 % (ref 36.0–46.0)
Hemoglobin: 11.8 g/dL — ABNORMAL LOW (ref 12.0–15.0)
MCH: 27.1 pg (ref 26.0–34.0)
MCHC: 31.4 g/dL (ref 30.0–36.0)
MCV: 86.4 fL (ref 80.0–100.0)
Platelets: 273 10*3/uL (ref 150–400)
RBC: 4.35 MIL/uL (ref 3.87–5.11)
RDW: 13.9 % (ref 11.5–15.5)
WBC: 8.2 10*3/uL (ref 4.0–10.5)
nRBC: 0 % (ref 0.0–0.2)

## 2024-03-09 LAB — PROTIME-INR
INR: 1.4 — ABNORMAL HIGH (ref 0.8–1.2)
Prothrombin Time: 17.5 s — ABNORMAL HIGH (ref 11.4–15.2)

## 2024-03-09 LAB — TROPONIN I (HIGH SENSITIVITY): Troponin I (High Sensitivity): 4 ng/L (ref <18)

## 2024-03-09 NOTE — ED Provider Triage Note (Signed)
 Emergency Medicine Provider Triage Evaluation Note  Meghan [name] , a 58 y.o. female  was evaluated in triage.  Pt complains of headache and chest pain..  Review of Systems  Positive: Headache Negative: Fevers  Physical Exam  BP 114/60   Pulse 72   Temp 98.3 F (36.8 C)   Resp 20   Wt 136 kg   SpO2 100%   BMI 49.89 kg/m  Some tenderness to left chest wall.  Medical Decision Making  Medically screening exam initiated at 12:16 PM.  Appropriate orders placed.  Meghan [name] was informed that the remainder of the evaluation will be completed by another provider, this initial triage assessment does not replace that evaluation, and the importance of remaining in the ED until their evaluation is complete.  Patient with headache.  Symptoms 2 weeks. Saw neurology.  Also sent some testing.  States she feels weak with her legs.  Will get head CT chest x-ray and blood work.  Has previous PEs and has anticoagulation and an IVC filter.     Patsey Lot, MD 03/09/24 [phone]

## 2024-03-09 NOTE — ED Notes (Signed)
 Patient stated she was feeling better than she was

## 2024-03-09 NOTE — ED Notes (Signed)
 Patient stated she was feeling better and didn't want to wait any longer. Pulled OTF.

## 2024-03-09 NOTE — ED Triage Notes (Signed)
 BIB EMS from doctors office with complaints of headache x 2 weeks and chest pain started this morning. Mid sternal pain radiates to left side of chest and down left arm.   EMS VS 126/78 74 HR 95% RA 324 ASA

## 2024-03-11 ENCOUNTER — Ambulatory Visit (INDEPENDENT_AMBULATORY_CARE_PROVIDER_SITE_OTHER): Admitting: Podiatry

## 2024-03-11 DIAGNOSIS — M19072 Primary osteoarthritis, left ankle and foot: Secondary | ICD-10-CM

## 2024-03-11 DIAGNOSIS — M1990 Unspecified osteoarthritis, unspecified site: Secondary | ICD-10-CM | POA: Diagnosis not present

## 2024-03-11 DIAGNOSIS — R601 Generalized edema: Secondary | ICD-10-CM

## 2024-03-11 DIAGNOSIS — Z9889 Other specified postprocedural states: Secondary | ICD-10-CM

## 2024-03-11 MED ORDER — OXYCODONE-ACETAMINOPHEN 5-325 MG PO TABS: 1.0000 | ORAL_TABLET | ORAL | 0 refills | Status: DC | PRN

## 2024-03-11 NOTE — Progress Notes (Unsigned)
 Subjective:  Patient ID: Meghan [name], female    DOB: Oct 25, 1965,  MRN: [id number]  Chief Complaint  Patient presents with   Routine Post Op    DOS 01/20/24 --- LEFT FOOT REMOVAL OF PAINFUL HARDWARE    58 y.o. female presents with the above complaint.  Patient presents with complaint of bilateral ankle as well as forefoot swelling.  She states it comes out of nowhere.  She has a history of CHF but she went to the emergency room with told her that she does not have any fluid in her body.  She denies any other acute complaints.  She would like to discuss treatment options for this.  She does not have any history of any inflammator arthritis.   Review of Systems: Negative except as noted in the HPI. Denies N/V/F/Ch.  Past Medical History:  Diagnosis Date   (HFpEF) heart failure with preserved ejection fraction (HCC) 01/2019   a.) Dx'd in [place]; b.) TTE 08/14/2022: EF 60-65%, mod LVH, mild LAE, triv MR   Anxiety    Asthma    Atrial fibrillation and flutter (HCC) 02/16/2019   a.) CHA2DS2VASc = 6 (sex, HFpEF, HTN, CVA x2, T2DM);  b.) s/p DCCV (200 J x 1) 08/22/2022; c.) s/p RF ablation (CTI) 11/02/2022; d.) rate/rhythm maintained on oral flecainide  + metoprolol  succinate; chronically anticoagulated with rivaroxaban    Class 3 severe obesity due to excess calories with body mass index (BMI) of 50.0 to 59.9 in adult    DDD (degenerative disc disease), lumbar    GERD (gastroesophageal reflux disease)    Hiatal hernia    History of head injury    HTN (hypertension)    Hypercholesterolemia    Ileus (HCC)    Intractable nausea and vomiting 11/16/2020   Long term current use of anticoagulant    a.) rivaroxaban    Macular degeneration of left eye    MDD (major depressive disorder)    OA (osteoarthritis)    OSA on CPAP    Pneumonia    Presence of IVC filter    PTSD (post-traumatic stress disorder)    Recurrent pulmonary emboli (HCC)    a.) s/p IVC filter placement 2003; b.)  previously followed by hematology when living in [place]   Seizure disorder (HCC)    a.) last was in 2020 per pt report   Stroke (HCC)    1989 and 1995 (left sided weakness)   T2DM (type 2 diabetes mellitus) (HCC)    Vitamin D deficiency     Current Outpatient Medications:    oxyCODONE -acetaminophen  (PERCOCET) 5-325 MG tablet, Take 1-2 tablets by mouth every 4 (four) hours as needed for severe pain (pain score 7-10)., Disp: 30 tablet, Rfl: 0   albuterol  (VENTOLIN  HFA) 108 (90 Base) MCG/ACT inhaler, Inhale 2 puffs into the lungs every 6 (six) hours as needed for wheezing or shortness of breath., Disp: , Rfl:    amitriptyline (ELAVIL) 10 MG tablet, Take 10 mg by mouth at bedtime., Disp: , Rfl:    bumetanide  (BUMEX ) 1 MG tablet, Take 1 tablet (1 mg total) by mouth daily for 10 days., Disp: 10 tablet, Rfl: 0   Cholecalciferol (VITAMIN D-3) 125 MCG (5000 UT) TABS, Take 5,000 Units by mouth daily., Disp: , Rfl:    lidocaine  (LIDODERM ) 5 %, Place 1 patch onto the skin daily. Remove & Discard patch within 12 hours or as directed by MD, Disp: 30 patch, Rfl: 0   MOUNJARO 7.5 MG/0.5ML Pen, Inject 7.5 mg into  the skin once a week., Disp: , Rfl:    Omega-3 Fatty Acids (FISH OIL) 1200 MG CAPS, Take 1,200 mg by mouth daily with breakfast., Disp: , Rfl:    omeprazole (PRILOSEC) 40 MG capsule, Take 40 mg by mouth 2 (two) times daily., Disp: , Rfl:    rivaroxaban  (XARELTO ) 20 MG TABS tablet, Take 20 mg by mouth at bedtime. , Disp: , Rfl:    rosuvastatin (CRESTOR) 10 MG tablet, Take 10 mg by mouth at bedtime., Disp: , Rfl:    sertraline  (ZOLOFT ) 50 MG tablet, Take 50 mg by mouth in the morning., Disp: , Rfl:    zolpidem  (AMBIEN  CR) 6.25 MG CR tablet, Take 1 tablet (6.25 mg total) by mouth at bedtime., Disp: 30 tablet, Rfl: 2  Social History   Tobacco Use  Smoking Status Never  Smokeless Tobacco Never    Allergies  Allergen Reactions   Acetaminophen -Codeine Swelling, Rash and Other (See Comments)     Tylenol  with Codeine, Tylenol  #3,  (facial swelling, hives)   Bee Venom Anaphylaxis   Meloxicam Anaphylaxis and Rash   Tramadol Hives and Other (See Comments)    Patient stated I was trippin' and I do not want that ever again   Codeine Hives and Swelling   Coconut (Cocos Nucifera) Rash and Other (See Comments)    ANY coconut products    Tomato Rash   Objective:  There were no vitals filed for this visit. There is no height or weight on file to calculate BMI. Constitutional Well developed. Well nourished.  Vascular Dorsalis pedis pulses palpable bilaterally. Posterior tibial pulses palpable bilaterally. Capillary refill normal to all digits.  No cyanosis or clubbing noted. Pedal hair growth normal.  Neurologic Normal speech. Oriented to person, place, and time. Epicritic sensation to light touch grossly present bilaterally.  Dermatologic Nails well groomed and normal in appearance. No open wounds. No skin lesions.  Orthopedic: Generalized fluid noted to the forefoot and ankle mild pain on palpation to the ankle joint.  Some pain at the surgical site.  Overall surgical site is completely healed and reepithelialized   Radiographs: None Assessment:   1. Arthritis of first metatarsophalangeal (MTP) joint of left foot   2. Generalized edema    Plan:  Patient was evaluated and treated and all questions answered.  Bilateral generalized edema with a possible concern for inflammatory arthritis - All questions and concerns were discussed with the patient in extensive detail given the presence of bilateral ankle swelling as well as forefoot swelling I believe patient will benefit from rheumatoid workup with rheumatoid panel HLA-B27 ESR CRP uric acid and other lab markers.  Order was placed for these markers - Will reevaluate in few weeks once the blood work is done.  She agrees with the plan - Patient may need a benefit referral to a rheumatologist in the future  No follow-ups on  file.

## 2024-03-12 ENCOUNTER — Ambulatory Visit (INDEPENDENT_AMBULATORY_CARE_PROVIDER_SITE_OTHER): Admitting: Licensed Clinical Social Worker

## 2024-03-12 DIAGNOSIS — F411 Generalized anxiety disorder: Secondary | ICD-10-CM | POA: Diagnosis not present

## 2024-03-12 DIAGNOSIS — F4310 Post-traumatic stress disorder, unspecified: Secondary | ICD-10-CM | POA: Diagnosis not present

## 2024-03-12 DIAGNOSIS — F331 Major depressive disorder, recurrent, moderate: Secondary | ICD-10-CM | POA: Diagnosis not present

## 2024-03-12 NOTE — Progress Notes (Signed)
 Virtual Visit via Video Note   I connected with Atiana S. Reinoso on 03/12/24 at 3:00pm by video enabled telemedicine application and verified that I am speaking with the correct person using two identifiers.   I discussed the limitations, risks, security and privacy concerns of performing an evaluation and management service by video and the availability of in person appointments. I also discussed with the patient that there may be a patient responsible charge related to this service. The patient expressed understanding and agreed to proceed.   I discussed the assessment and treatment plan with the patient. The patient was provided an opportunity to ask questions and all were answered. The patient agreed with the plan and demonstrated an understanding of the instructions.   The patient was advised to call back or seek an in-person evaluation if the symptoms worsen or if the condition fails to improve as anticipated.   I provided 48 minutes of non-face-to-face time during this encounter.   [name], LCSW, LCAS _______________________ THERAPIST PROGRESS NOTE   Session Time:  3:00pm - 3:48pm   Location: Patient: Patient Home Provider: Home Office    Participation Level: Active    Behavioral Response: Alert, casually dressed, euthymic mood/affect   Type of Therapy:  Individual Therapy   Treatment Goals addressed: Mood management; Medication management; Getting regular exercise   Progress Towards Goals: Progressing   Interventions: CBT: challenging anxious thoughts     Summary: Makena S. Surman is a 58 year old divorced African American female that presented for therapy appointment today with diagnoses of Major Depressive Disorder, recurrent, moderate, Generalized Anxiety Disorder and PTSD.      Suicidal/Homicidal: None; without plan or intent                                                                                                                Therapist Response:  Clinician met  with Athaliah for virtual therapy session and assessed for safety, sobriety, and medication compliance.  Tanijah presented for appointment on time and was alert, oriented x5, with no evidence or self-report of active SI/HI or A/V H.  Idaliz reported ongoing compliance with medication and denied any use of alcohol or illicit substances.  Clinician inquired about Keshauna's emotional ratings today, as well as any significant changes in thoughts, feelings, or behavior since last check-in.  Teonna reported scores of 0/10 for depression, 0/10 for anxiety, and 0/10 for anger/irritability.  Marybella denied any recent outbursts or panic attacks.  Effie reported that a recent struggle has been wanting to start swimming again, but having concerns about this.  Clinician utilized handout in session today titled Worry exploration in order to assist Shailyn in reducing her anxiety related to going back to the [employer] to participate in water aerobics.  This worksheet featured a series of Socratic questions aimed at exploring the most likely outcomes for a situation of concern, rather than focusing on the worst possible outcome (i.e. catastrophizing).  Clinician assisted Mykalah in identifying and challenging any irrational beliefs related to this worry,  in addition to utilizing problem solving approach to explore strategies which would help her accomplish goal of returning to these classes without having anymore seizures.  Junice actively participated in discussion on handout, reporting that she is worried that she will have a seizure if she goes back to the pool.  Braleigh reported that there is sufficient evidence to suggest she will be okay if she follows recommendations from the doctor and implements strategies learned from the previous incident.  Jamyrah reported that she will plan to stay away from hot tub since this could lead to extreme temperature changes, use anger management skills if triggered, prioritize regular sleep, monitor her blood  sugar and blood pressure, eat regularly, and stay hydrated.  Intervention was effective, as evidenced by Cicley reporting that discussion on this subject increased her motivation toward returning to the [facility] to get more exercise, and gave her ideas on how to accomplish this.  Clinician will continue to monitor.          Plan: Follow up again in 1-2 weeks.    Diagnosis: Major depressive disorder, recurrent, moderate; Generalized Anxiety Disorder; and PTSD   Collaboration of Care:   No collaboration of care required for this visit.                                                   Patient/Guardian was advised Release of Information must be obtained prior to any record release in order to collaborate their care with an outside provider. Patient/Guardian was advised if they have not already done so to contact the registration department to sign all necessary forms in order for us  to release information regarding their care.    Consent: Patient/Guardian gives verbal consent for treatment and assignment of benefits for services provided during this visit. Patient/Guardian expressed understanding and agreed to proceed.  [name], LCSW, LCAS 03/12/24

## 2024-03-16 ENCOUNTER — Ambulatory Visit (INDEPENDENT_AMBULATORY_CARE_PROVIDER_SITE_OTHER): Admitting: Licensed Clinical Social Worker

## 2024-03-16 DIAGNOSIS — F411 Generalized anxiety disorder: Secondary | ICD-10-CM | POA: Diagnosis not present

## 2024-03-16 DIAGNOSIS — F331 Major depressive disorder, recurrent, moderate: Secondary | ICD-10-CM

## 2024-03-16 DIAGNOSIS — F4310 Post-traumatic stress disorder, unspecified: Secondary | ICD-10-CM | POA: Diagnosis not present

## 2024-03-16 NOTE — Progress Notes (Signed)
 Virtual Visit via Video Note   I connected with Chantal S. Blubaugh on 03/16/24 at 2:00pm by video enabled telemedicine application and verified that I am speaking with the correct person using two identifiers.   I discussed the limitations, risks, security and privacy concerns of performing an evaluation and management service by video and the availability of in person appointments. I also discussed with the patient that there may be a patient responsible charge related to this service. The patient expressed understanding and agreed to proceed.   I discussed the assessment and treatment plan with the patient. The patient was provided an opportunity to ask questions and all were answered. The patient agreed with the plan and demonstrated an understanding of the instructions.   The patient was advised to call back or seek an in-person evaluation if the symptoms worsen or if the condition fails to improve as anticipated.   I provided 34 minutes of non-face-to-face time during this encounter.   [name], LCSW, LCAS _______________________ THERAPIST PROGRESS NOTE   Session Time:  2:00pm - 2:34pm    Location: Patient: Patient Home Provider: [facility]    Participation Level: Active    Behavioral Response: Alert, casually dressed, euthymic mood/affect   Type of Therapy:  Individual Therapy   Treatment Goals addressed: Mood management; Medication management; Establishing healthier boundaries   Progress Towards Goals: Progressing   Interventions: CBT, psychoeducation on healthy boundaries    Summary: Graycie S. Simoni is a 58 year old divorced African American female that presented for therapy appointment today with diagnoses of Major Depressive Disorder, recurrent, moderate, Generalized Anxiety Disorder and PTSD.      Suicidal/Homicidal: None; without plan or intent                                                                                                                Therapist  Response:  Clinician met with Joslynne for virtual therapy appointment and assessed for safety, sobriety, and medication compliance.  Naudia presented for session on time and was alert, oriented x5, with no evidence or self-report of active SI/HI or A/V H.  Karolynn reported ongoing compliance with medication and denied any use of alcohol or illicit substances.  Clinician inquired about Hamdi's current emotional ratings, as well as any significant changes in thoughts, feelings, or behavior since previous check-in.  Mallie reported scores of 0/10 for depression, 0/10 for anxiety, and 0/10 for anger/irritability.  Kindle denied any recent outbursts or panic attacks.  Harika reported that a recent struggle has been coordinating with her daughter and grandson, who will be moving to her city starting today.  Jadalynn reported that they will share the apartment with her until their apartment is ready, and when they lived together in the past, she was overwhelmed with stress and was having seizures.  Clinician discussed topic of boundaries with Malgorzata today to assist.  Clinician utilized a handout on the subject which featured various categories present within typical relationship, including physical, intellectual, emotional, sexual, material, and time.  Healthy  versus unhealthy traits were presented for each category to gauge quality of boundaries within the relationship (i.e. respecting a partner's unique thoughts and ideas in regard to healthy intellectual boundaries instead of dismissing or belittling one's beliefs).  Clinician inquired about any areas of concern she anticipates with her daughter or grandson's boundaries and discussed strategies for assertively communicating these issues to seek resolution.  Intervention was effective, as evidenced by Malachi's active engagement in discussion on subject, reporting that there are several boundaries that will need to be set with her family before they move in today.  Skyllar reported that  in regard to physical boundaries, she will agree to share her bed with the daughter and the grandson will sleep in the recliner.  She reported that they will be able to live there for 2-3 weeks, with breaks living at her sister's place to avoid tension building up.  She reported that the daughter will be expected to contribute to finances to offset rise in utilities, including offering to cook meals, and some bills like electricity.  Denaja reported that sleep is important to her, so 'quiet time' will be enforced from 10pm-10am each day.  Jazel reported that she feels confident about addressing this with her daughter assertively, stating Our communication has gotten a lot better over the years.  I'm a lot more understanding now too".  Clinician will continue to monitor.          Plan: Follow up again in 1-2 weeks.    Diagnosis: Major depressive disorder, recurrent, moderate; Generalized Anxiety Disorder; and PTSD   Collaboration of Care:   No collaboration of care required for this visit.                                                   Patient/Guardian was advised Release of Information must be obtained prior to any record release in order to collaborate their care with an outside provider. Patient/Guardian was advised if they have not already done so to contact the registration department to sign all necessary forms in order for us  to release information regarding their care.    Consent: Patient/Guardian gives verbal consent for treatment and assignment of benefits for services provided during this visit. Patient/Guardian expressed understanding and agreed to proceed.  [name], LCSW, LCAS 03/16/24

## 2024-03-25 ENCOUNTER — Ambulatory Visit (INDEPENDENT_AMBULATORY_CARE_PROVIDER_SITE_OTHER): Admitting: Licensed Clinical Social Worker

## 2024-03-25 DIAGNOSIS — F411 Generalized anxiety disorder: Secondary | ICD-10-CM

## 2024-03-25 DIAGNOSIS — F4310 Post-traumatic stress disorder, unspecified: Secondary | ICD-10-CM

## 2024-03-25 DIAGNOSIS — F331 Major depressive disorder, recurrent, moderate: Secondary | ICD-10-CM

## 2024-03-25 LAB — CBC WITH DIFFERENTIAL/PLATELET
Basophils Absolute: 0 x10E3/uL (ref 0.0–0.2)
Basos: 0 %
EOS (ABSOLUTE): 0.2 x10E3/uL (ref 0.0–0.4)
Eos: 5 %
Hematocrit: 35.5 % (ref 34.0–46.6)
Hemoglobin: 11.4 g/dL (ref 11.1–15.9)
Immature Grans (Abs): 0 x10E3/uL (ref 0.0–0.1)
Immature Granulocytes: 0 %
Lymphocytes Absolute: 1.4 x10E3/uL (ref 0.7–3.1)
Lymphs: 32 %
MCH: 27 pg (ref 26.6–33.0)
MCHC: 32.1 g/dL (ref 31.5–35.7)
MCV: 84 fL (ref 79–97)
Monocytes Absolute: 0.4 x10E3/uL (ref 0.1–0.9)
Monocytes: 9 %
Neutrophils Absolute: 2.4 x10E3/uL (ref 1.4–7.0)
Neutrophils: 54 %
Platelets: 241 x10E3/uL (ref 150–450)
RBC: 4.23 x10E6/uL (ref 3.77–5.28)
RDW: 14.2 % (ref 11.7–15.4)
WBC: 4.5 x10E3/uL (ref 3.4–10.8)

## 2024-03-25 LAB — COMPREHENSIVE METABOLIC PANEL WITH GFR
ALT: 76 IU/L — ABNORMAL HIGH (ref 0–32)
AST: 51 IU/L — ABNORMAL HIGH (ref 0–40)
Albumin: 4 g/dL (ref 3.8–4.9)
Alkaline Phosphatase: 287 IU/L — ABNORMAL HIGH (ref 44–121)
BUN/Creatinine Ratio: 16 (ref 9–23)
BUN: 14 mg/dL (ref 6–24)
Bilirubin Total: 0.3 mg/dL (ref 0.0–1.2)
CO2: 24 mmol/L (ref 20–29)
Calcium: 10.3 mg/dL — ABNORMAL HIGH (ref 8.7–10.2)
Chloride: 104 mmol/L (ref 96–106)
Creatinine, Ser: 0.86 mg/dL (ref 0.57–1.00)
Globulin, Total: 2.8 g/dL (ref 1.5–4.5)
Glucose: 95 mg/dL (ref 70–99)
Potassium: 4.4 mmol/L (ref 3.5–5.2)
Sodium: 141 mmol/L (ref 134–144)
Total Protein: 6.8 g/dL (ref 6.0–8.5)
eGFR: 78 mL/min/1.73 (ref >59)

## 2024-03-25 LAB — C-REACTIVE PROTEIN: CRP: 18 mg/L — ABNORMAL HIGH (ref 0–10)

## 2024-03-25 LAB — SEDIMENTATION RATE: Sed Rate: 92 mm/h — ABNORMAL HIGH (ref 0–40)

## 2024-03-25 LAB — HLA-B27 ANTIGEN: HLA B27: NEGATIVE

## 2024-03-25 LAB — ANA: Anti Nuclear Antibody (ANA): NEGATIVE

## 2024-03-25 LAB — URIC ACID: Uric Acid: 5 mg/dL (ref 3.0–7.2)

## 2024-03-25 LAB — RHEUMATOID FACTOR: Rheumatoid fact SerPl-aCnc: <10 [IU]/mL (ref <14.0)

## 2024-03-25 NOTE — Progress Notes (Signed)
 Virtual Visit via Video Note   I connected with Meghan [name] on 03/25/24 at 2:00pm by video enabled telemedicine application and verified that I am speaking with the correct person using two identifiers.   I discussed the limitations, risks, security and privacy concerns of performing an evaluation and management service by video and the availability of in person appointments. I also discussed with the patient that there may be a patient responsible charge related to this service. The patient expressed understanding and agreed to proceed.   I discussed the assessment and treatment plan with the patient. The patient was provided an opportunity to ask questions and all were answered. The patient agreed with the plan and demonstrated an understanding of the instructions.   The patient was advised to call back or seek an in-person evaluation if the symptoms worsen or if the condition fails to improve as anticipated.   I provided 33 minutes of non-face-to-face time during this encounter.   [name], LCSW, LCAS _______________________ THERAPIST PROGRESS NOTE   Session Time:  2:00pm - 2:33pm    Location: Patient: Patient Home Provider: Home Office    Participation Level: Active    Behavioral Response: Alert, casually dressed, euthymic mood/affect   Type of Therapy:  Individual Therapy   Treatment Goals addressed: Mood management; Medication management   Progress Towards Goals: Progressing   Interventions: CBT: self-care assessment    Summary: Meghan [name] is a 58 year old divorced African American female that presented for therapy appointment today with diagnoses of Major Depressive Disorder, recurrent, moderate, Generalized Anxiety Disorder and PTSD.      Suicidal/Homicidal: None; without plan or intent                                                                                                                Therapist Response:  Clinician met with Meghan [name] for virtual therapy  session and assessed for safety, sobriety, and medication compliance.  Meghan [name] presented for appointment on time and was alert, oriented x5, with no evidence or self-report of active SI/HI or A/V H.  Meghan [name] reported ongoing compliance with medication and denied any use of alcohol or illicit substances.  Clinician inquired about Meghan [name]'s emotional ratings today, as well as any significant changes in thoughts, feelings, or behavior since last check-in.  Meghan [name] reported scores of 0/10 for depression, 0/10 for anxiety, and 0/10 for anger/irritability.  Meghan [name] denied any recent outbursts or panic attacks.  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  Meghan [name] reported that she enjoyed her experience and found the staff to be kind.  She denied any new struggles.  Clinician introduced topic of self-care today.  Clinician explained how this can be defined as the things one does to maintain good health and improve well-being.  Clinician provided Meghan [name] with a self-care assessment form to complete.  This handout featured various sub-categories of self-care, including physical, psychological/emotional, social, spiritual, and professional.  Meghan [name] was asked to rank her engagement  in the activities listed for each dimension on a scale of 1-3, with 1 indicating 'Poor', 2 indicating 'Ok', and 3 indicating 'Well'.  Clinician invited Meghan [name] to share results of her assessment, and inquired about which areas of self-care she is doing well in, as well as areas that require attention, and how she plans to begin addressing this during treatment.  Intervention was effective, as evidenced by Meghan [name] successfully completing first section (physical) of assessment and actively engaging in discussion on subject, reporting that she is excelling in areas such as eating healthy foods, taking care of personal hygiene, participating in fun activities, and attending preventative medical appointments,  but would benefit from focusing more on areas such as exercise, wearing clothes that make her feel good, eating regularly, and getting enough sleep.  Meghan [name] reported that she would work to improve self-care deficits by starting to exercise again next week once her doctor medically clears her to walk and swim, buying new clothes when financially able in order to improve self-image, eating 3 solid meals each day, and taking new medication from her doctor to increase sleep.  Clinician will continue to monitor.          Plan: Follow up again in 1-2 weeks.    Diagnosis: Major depressive disorder, recurrent, moderate; Generalized Anxiety Disorder; and PTSD    Collaboration of Care:   No collaboration of care required for this visit.                                                   Patient/Guardian was advised Release of Information must be obtained prior to any record release in order to collaborate their care with an outside provider. Patient/Guardian was advised if they have not already done so to contact the registration department to sign all necessary forms in order for us  to release information regarding their care.    Consent: Patient/Guardian gives verbal consent for treatment and assignment of benefits for services provided during this visit. Patient/Guardian expressed understanding and agreed to proceed.   [name], LCSW, LCAS 03/25/24

## 2024-03-26 ENCOUNTER — Ambulatory Visit: Admitting: Pulmonary Disease

## 2024-03-30 ENCOUNTER — Ambulatory Visit (INDEPENDENT_AMBULATORY_CARE_PROVIDER_SITE_OTHER): Admitting: Licensed Clinical Social Worker

## 2024-03-30 DIAGNOSIS — F4310 Post-traumatic stress disorder, unspecified: Secondary | ICD-10-CM

## 2024-03-30 DIAGNOSIS — F411 Generalized anxiety disorder: Secondary | ICD-10-CM | POA: Diagnosis not present

## 2024-03-30 DIAGNOSIS — F331 Major depressive disorder, recurrent, moderate: Secondary | ICD-10-CM

## 2024-03-30 NOTE — Progress Notes (Signed)
 Virtual Visit via Video Note   I connected with Meghan [name] on 03/30/24 at 1:00pm by video enabled telemedicine application and verified that I am speaking with the correct person using two identifiers.   I discussed the limitations, risks, security and privacy concerns of performing an evaluation and management service by video and the availability of in person appointments. I also discussed with the patient that there may be a patient responsible charge related to this service. The patient expressed understanding and agreed to proceed.   I discussed the assessment and treatment plan with the patient. The patient was provided an opportunity to ask questions and all were answered. The patient agreed with the plan and demonstrated an understanding of the instructions.   The patient was advised to call back or seek an in-person evaluation if the symptoms worsen or if the condition fails to improve as anticipated.   I provided 1 hour of non-face-to-face time during this encounter.   Meghan Ricker, Meghan [name], Meghan [name] _______________________ THERAPIST PROGRESS NOTE   Session Time:  1:00pm - 2:00pm    Location: Patient: Patient Home Provider: Home Office    Participation Level: Active    Behavioral Response: Alert, casually dressed, anxious mood/affect   Type of Therapy:  Individual Therapy   Treatment Goals addressed: Mood management; Medication management; Maintaining healthy boundaries with family    Progress Towards Goals: Progressing   Interventions: CBT: challenging anxious thoughts    Summary: Meghan [name] is a 58 year old divorced African American female that presented for therapy appointment today with diagnoses of Major Depressive Disorder, recurrent, moderate, Generalized Anxiety Disorder and PTSD.      Suicidal/Homicidal: None; without plan or intent                                                                                                                Therapist Response:   Clinician met with Meghan [name] for virtual therapy appointment and assessed for safety, sobriety, and medication compliance.  Meghan [name] presented for session on time and was alert, oriented x5, with no evidence or self-report of active SI/HI or A/V H.  Meghan [name] reported ongoing compliance with medication and denied any use of alcohol or illicit substances.  Clinician inquired about Meghan [name]'s current emotional ratings, as well as any significant changes in thoughts, feelings, or behavior since previous check-in.  Meghan [name] reported scores of 0/10 for depression, 8/10 for anxiety, and 0/10 for anger/irritability.  Meghan [name] denied any recent outbursts or panic attacks.  Meghan [name] reported that a recent success was having a surgery completed on her back last week, although it did leave her in some pain.  She reported that she has been trying to rest more often as she recuperates.  Meghan [name] reported that a struggle has been worrying about her daughter and grandson, who have applied to an apartment.  Meghan [name] stated I'm praying that they will be approved, but I'm anxious about them not getting it.  Clinician utilized handout in session today titled Worry exploration in order to assist Meghan [name]  in reducing her anxiety related to her daughter securing housing.  This worksheet featured a series of Socratic questions aimed at exploring the most likely outcomes for a situation of concern, rather than focusing on the worst possible outcome (i.e. catastrophizing).  Clinician assisted Meghan [name] in identifying and challenging any irrational beliefs related to this worry, in addition to utilizing problem solving approach to explore strategies which would help her assist the daughter if necessary in securing an apartment.  Meghan [name] actively participated in discussion on handout, reporting that she is worried that the daughter will not be approved, and they will have to continue staying with Meghan [name], which could cause problems with her landlord.  Meghan [name] reported that there  is sufficient evidence to suggest her daughter will be okay, since the daughter doesn't owe money, has good credit, and no legal issues.  She reported that her daughter also has other section 8 options to explore if this option falls through.  Intervention was effective, as evidenced by Koa reporting that discussion on this subject helped to reduce her anxiety, and reassured her that things will work out regardless of whether she is approved for the initial application.  Meghan [name] reported that she will also look into therapist referrals for her daughter for further support during this transition.  Clinician will continue to monitor.          Plan: Follow up again in 1-2 weeks.    Diagnosis: Major depressive disorder, recurrent, moderate; Generalized Anxiety Disorder; and PTSD    Collaboration of Care:   No collaboration of care required for this visit.                                                   Patient/Guardian was advised Release of Information must be obtained prior to any record release in order to collaborate their care with an outside provider. Patient/Guardian was advised if they have not already done so to contact the registration department to sign all necessary forms in order for us  to release information regarding their care.    Consent: Patient/Guardian gives verbal consent for treatment and assignment of benefits for services provided during this visit. Patient/Guardian expressed understanding and agreed to proceed.   Meghan Ricker, Meghan [name], Meghan [name] 03/30/24

## 2024-04-06 ENCOUNTER — Other Ambulatory Visit (HOSPITAL_COMMUNITY): Payer: Self-pay | Admitting: Gastroenterology

## 2024-04-06 DIAGNOSIS — R1319 Other dysphagia: Secondary | ICD-10-CM

## 2024-04-08 ENCOUNTER — Ambulatory Visit (INDEPENDENT_AMBULATORY_CARE_PROVIDER_SITE_OTHER): Admitting: Podiatry

## 2024-04-08 DIAGNOSIS — M65971 Unspecified synovitis and tenosynovitis, right ankle and foot: Secondary | ICD-10-CM

## 2024-04-08 DIAGNOSIS — M1990 Unspecified osteoarthritis, unspecified site: Secondary | ICD-10-CM

## 2024-04-08 MED ORDER — OXYCODONE-ACETAMINOPHEN 5-325 MG PO TABS: 1.0000 | ORAL_TABLET | ORAL | 0 refills | Status: DC | PRN

## 2024-04-08 NOTE — Progress Notes (Signed)
 Subjective:  Patient ID: Meghan [name], female    DOB: 1966-01-31,  MRN: [id number]  Chief Complaint  Patient presents with   Arthritis    58 y.o. female presents with the above complaint.  Patient presents with complaint of right second metatarsophalangeal joint injury.  She states that she was walking and fell along when she had her pain shooting through the big second toe.  She went to get it evaluated she noticed it to go up.  She wanted to discuss treatment options there is been causing a lot of discomfort happening Saturday.  She is currently not immobilized.  Denies any other acute complaints   Review of Systems: Negative except as noted in the HPI. Denies N/V/F/Ch.  Past Medical History:  Diagnosis Date   (HFpEF) heart failure with preserved ejection fraction (HCC) 01/2019   a.) Dx'd in [place]; b.) TTE 08/14/2022: EF 60-65%, mod LVH, mild LAE, triv MR   Anxiety    Asthma    Atrial fibrillation and flutter (HCC) 02/16/2019   a.) CHA2DS2VASc = 6 (sex, HFpEF, HTN, CVA x2, T2DM);  b.) s/p DCCV (200 J x 1) 08/22/2022; c.) s/p RF ablation (CTI) 11/02/2022; d.) rate/rhythm maintained on oral flecainide  + metoprolol  succinate; chronically anticoagulated with rivaroxaban    Class 3 severe obesity due to excess calories with body mass index (BMI) of 50.0 to 59.9 in adult    DDD (degenerative disc disease), lumbar    GERD (gastroesophageal reflux disease)    Hiatal hernia    History of head injury    HTN (hypertension)    Hypercholesterolemia    Ileus (HCC)    Intractable nausea and vomiting 11/16/2020   Long term current use of anticoagulant    a.) rivaroxaban    Macular degeneration of left eye    MDD (major depressive disorder)    OA (osteoarthritis)    OSA on CPAP    Pneumonia    Presence of IVC filter    PTSD (post-traumatic stress disorder)    Recurrent pulmonary emboli (HCC)    a.) s/p IVC filter placement 2003; b.) previously followed by hematology when living in  [place]   Seizure disorder (HCC)    a.) last was in 2020 per pt report   Stroke (HCC)    1989 and 1995 (left sided weakness)   T2DM (type 2 diabetes mellitus) (HCC)    Vitamin D deficiency     Current Outpatient Medications:    albuterol  (VENTOLIN  HFA) 108 (90 Base) MCG/ACT inhaler, Inhale 2 puffs into the lungs every 6 (six) hours as needed for wheezing or shortness of breath., Disp: , Rfl:    amitriptyline (ELAVIL) 10 MG tablet, Take 10 mg by mouth at bedtime., Disp: , Rfl:    bumetanide  (BUMEX ) 1 MG tablet, Take 1 tablet (1 mg total) by mouth daily for 10 days., Disp: 10 tablet, Rfl: 0   Cholecalciferol (VITAMIN D-3) 125 MCG (5000 UT) TABS, Take 5,000 Units by mouth daily., Disp: , Rfl:    lidocaine  (LIDODERM ) 5 %, Place 1 patch onto the skin daily. Remove & Discard patch within 12 hours or as directed by MD, Disp: 30 patch, Rfl: 0   MOUNJARO 7.5 MG/0.5ML Pen, Inject 7.5 mg into the skin once a week., Disp: , Rfl:    Omega-3 Fatty Acids (FISH OIL) 1200 MG CAPS, Take 1,200 mg by mouth daily with breakfast., Disp: , Rfl:    omeprazole (PRILOSEC) 40 MG capsule, Take 40 mg by mouth 2 (two)  times daily., Disp: , Rfl:    oxyCODONE -acetaminophen  (PERCOCET) 5-325 MG tablet, Take 1-2 tablets by mouth every 4 (four) hours as needed for severe pain (pain score 7-10)., Disp: 30 tablet, Rfl: 0   rivaroxaban  (XARELTO ) 20 MG TABS tablet, Take 20 mg by mouth at bedtime. , Disp: , Rfl:    rosuvastatin (CRESTOR) 10 MG tablet, Take 10 mg by mouth at bedtime., Disp: , Rfl:    sertraline  (ZOLOFT ) 50 MG tablet, Take 50 mg by mouth in the morning., Disp: , Rfl:    zolpidem  (AMBIEN  CR) 6.25 MG CR tablet, Take 1 tablet (6.25 mg total) by mouth at bedtime., Disp: 30 tablet, Rfl: 2  Social History   Tobacco Use  Smoking Status Never  Smokeless Tobacco Never    Allergies  Allergen Reactions   Acetaminophen -Codeine Swelling, Rash and Other (See Comments)    Tylenol  with Codeine, Tylenol  #3,  (facial  swelling, hives)   Bee Venom Anaphylaxis   Meloxicam Anaphylaxis and Rash   Tramadol Hives and Other (See Comments)    Patient stated I was trippin' and I do not want that ever again   Codeine Hives and Swelling   Coconut (Cocos Nucifera) Rash and Other (See Comments)    ANY coconut products    Tomato Rash   Objective:  There were no vitals filed for this visit. There is no height or weight on file to calculate BMI. Constitutional Well developed. Well nourished.  Vascular Dorsalis pedis pulses palpable bilaterally. Posterior tibial pulses palpable bilaterally. Capillary refill normal to all digits.  No cyanosis or clubbing noted. Pedal hair growth normal.  Neurologic Normal speech. Oriented to person, place, and time. Epicritic sensation to light touch grossly present bilaterally.  Dermatologic Nails well groomed and normal in appearance. No open wounds. No skin lesions.  Orthopedic: Pain on palpation right second metatarsophalangeal joint pain mild pain with range of motion of the joint.  Deep intra-articular pain noted.  No crepitus noted.   Radiographs: None Assessment:   1. Inflammatory arthritis   2. Synovitis of right foot    Plan:  Patient was evaluated and treated and all questions answered.    Bilateral generalized edema with a possible concern for inflammatory arthritis - All questions and concerns were discussed with the patient in extensive detail given the presence of bilateral ankle swelling as well as forefoot swelling the only positive lab markers were ESR for inflammation at this time patient would benefit from rheumatology referral as patient has generalized pain at the bilateral ankle - Referral for rheumatology was placed  No follow-ups on file.

## 2024-04-08 NOTE — Addendum Note (Signed)
 Addended by: Nanako Stopher on: 04/08/2024 03:50 PM   Modules accepted: Orders

## 2024-04-10 ENCOUNTER — Ambulatory Visit (INDEPENDENT_AMBULATORY_CARE_PROVIDER_SITE_OTHER): Admitting: Pulmonary Disease

## 2024-04-10 VITALS — BP 96/61 | HR 72

## 2024-04-10 DIAGNOSIS — G4700 Insomnia, unspecified: Secondary | ICD-10-CM

## 2024-04-10 DIAGNOSIS — J45909 Unspecified asthma, uncomplicated: Secondary | ICD-10-CM

## 2024-04-10 DIAGNOSIS — G4709 Other insomnia: Secondary | ICD-10-CM | POA: Diagnosis not present

## 2024-04-10 DIAGNOSIS — G4733 Obstructive sleep apnea (adult) (pediatric): Secondary | ICD-10-CM

## 2024-04-10 NOTE — Progress Notes (Signed)
 Meghan [name]    [id number]    [birth year]  Primary Care Physician:[place], Leita Ruth, FNP  Referring Physician: Elizbeth Leita Ruth, FNP [address] [place],  [place] [address]  Chief complaint:   Follow-up for obstructive sleep apnea  HPI:  Patient with obstructive sleep apnea Compliant with CPAP use  Feels machine is not working as well she has had to tape hoses,  Did have multiple awakenings the last time she was here, this is better  Her health has been overall stable  Continues to work on weight loss efforts  She is active, tries to exercise regularly limited by knee problems  She does have a history of generalized anxiety, PTSD but does not feel this is acting up at present  She has been on doxepin in the past - Did not really help her sleeping  Outpatient Encounter Medications as of 04/10/2024  Medication Sig   albuterol  (VENTOLIN  HFA) 108 (90 Base) MCG/ACT inhaler Inhale 2 puffs into the lungs every 6 (six) hours as needed for wheezing or shortness of breath.   amitriptyline (ELAVIL) 10 MG tablet Take 10 mg by mouth at bedtime.   bumetanide  (BUMEX ) 1 MG tablet Take 1 tablet (1 mg total) by mouth daily for 10 days.   Cholecalciferol (VITAMIN D-3) 125 MCG (5000 UT) TABS Take 5,000 Units by mouth daily.   MOUNJARO 7.5 MG/0.5ML Pen Inject 7.5 mg into the skin once a week.   Omega-3 Fatty Acids (FISH OIL) 1200 MG CAPS Take 1,200 mg by mouth daily with breakfast.   oxyCODONE -acetaminophen  (PERCOCET) 5-325 MG tablet Take 1 tablet by mouth every 4 (four) hours as needed for severe pain (pain score 7-10).   rivaroxaban  (XARELTO ) 20 MG TABS tablet Take 20 mg by mouth at bedtime.    rosuvastatin (CRESTOR) 10 MG tablet Take 10 mg by mouth at bedtime.   sertraline  (ZOLOFT ) 50 MG tablet Take 50 mg by mouth in the morning.   lidocaine  (LIDODERM ) 5 % Place 1 patch onto the skin daily. Remove & Discard patch within 12 hours or as directed by MD   omeprazole  (PRILOSEC) 40 MG capsule Take 40 mg by mouth 2 (two) times daily.   oxyCODONE -acetaminophen  (PERCOCET) 5-325 MG tablet Take 1-2 tablets by mouth every 4 (four) hours as needed for severe pain (pain score 7-10).   zolpidem  (AMBIEN  CR) 6.25 MG CR tablet Take 1 tablet (6.25 mg total) by mouth at bedtime.   No facility-administered encounter medications on file as of 04/10/2024.    Allergies as of 04/10/2024 - Review Complete 04/10/2024  Allergen Reaction Noted   Acetaminophen -codeine Swelling, Rash, and Other (See Comments) 06/02/2019   Bee venom Anaphylaxis 05/28/2022   Meloxicam Anaphylaxis and Rash 03/24/2020   Tramadol Hives and Other (See Comments) 10/18/2020   Codeine Hives and Swelling 01/11/2021   Coconut (cocos nucifera) Rash and Other (See Comments) 06/02/2019   Tomato Rash 06/02/2019    Past Medical History:  Diagnosis Date   (HFpEF) heart failure with preserved ejection fraction (HCC) 01/2019   a.) Dx'd in [place]; b.) TTE 08/14/2022: EF 60-65%, mod LVH, mild LAE, triv MR   Anxiety    Asthma    Atrial fibrillation and flutter (HCC) 02/16/2019   a.) CHA2DS2VASc = 6 (sex, HFpEF, HTN, CVA x2, T2DM);  b.) s/p DCCV (200 J x 1) 08/22/2022; c.) s/p RF ablation (CTI) 11/02/2022; d.) rate/rhythm maintained on oral flecainide  + metoprolol  succinate; chronically anticoagulated with  rivaroxaban    Class 3 severe obesity due to excess calories with body mass index (BMI) of 50.0 to 59.9 in adult    DDD (degenerative disc disease), lumbar    GERD (gastroesophageal reflux disease)    Hiatal hernia    History of head injury    HTN (hypertension)    Hypercholesterolemia    Ileus (HCC)    Intractable nausea and vomiting 11/16/2020   Long term current use of anticoagulant    a.) rivaroxaban    Macular degeneration of left eye    MDD (major depressive disorder)    OA (osteoarthritis)    OSA on CPAP    Pneumonia    Presence of IVC filter    PTSD (post-traumatic stress disorder)     Recurrent pulmonary emboli (HCC)    a.) s/p IVC filter placement 2003; b.) previously followed by hematology when living in [place]   Seizure disorder (HCC)    a.) last was in 2020 per pt report   Stroke (HCC)    1989 and 1995 (left sided weakness)   T2DM (type 2 diabetes mellitus) (HCC)    Vitamin D deficiency     Past Surgical History:  Procedure Laterality Date   A-FLUTTER ABLATION N/A 11/02/2022   Procedure: A-FLUTTER ABLATION;  Surgeon: Mealor, Eulas BRAVO, MD;  Location: [facility];  Service: Cardiovascular;  Laterality: N/A;   ARTHRODESIS METATARSALPHALANGEAL JOINT (MTPJ) Left 02/25/2023   Procedure: ARTHRODESIS METATARSALPHALANGEAL JOINT (MTPJ);  Surgeon: Tobie Franky SQUIBB, DPM;  Location: [facility];  Service: Podiatry;  Laterality: Left;   CARDIAC CATHETERIZATION  2017   in [facility] in [place]   CARDIOVERSION N/A 08/22/2022   Procedure: CARDIOVERSION;  Surgeon: Sheena Pugh, DO;  Location: [facility];  Service: Cardiovascular;  Laterality: N/A;   CESAREAN SECTION N/A 1989   CESAREAN SECTION N/A 1993   CHOLECYSTECTOMY  2003   COLONOSCOPY WITH PROPOFOL  N/A 06/05/2019   Procedure: COLONOSCOPY WITH PROPOFOL ;  Surgeon: Rollin Dover, MD;  Location: [facility];  Service: Endoscopy;  Laterality: N/A;   CYST REMOVAL WITH BONE GRAFT Left 10/18/2020   Procedure: BONE GRAFTING OF ENCHONDROMA MIDDLE PHANLANX OF LEFT MIDDLE FINGER;  Surgeon: Murrell Kuba, MD;  Location: [facility];  Service: Orthopedics;  Laterality: Left;  AXILLARY BLOCK   DIAGNOSTIC LAPAROSCOPY  2015; 2017   lap hernia repair x2   ENDOVENOUS ABLATION SAPHENOUS VEIN W/ LASER Left 08/31/2021   endovenous laser ablation left greater saphenous vein and stab phlebectomy 10-20 incisions left leg by Penne Colorado MD   HALLUX FUSION Right 05/28/2022   Procedure: HALLUX FUSION METATARSAL PHALANGEAL JOIT;  Surgeon: Tobie Franky SQUIBB, DPM;  Location: [facility];  Service: Podiatry;  Laterality:  Right;  BLOCK   HERNIA REPAIR  2014, 2018   umbilical hernia repair   IVC FILTER INSERTION  2003   POLYPECTOMY  06/05/2019   Procedure: POLYPECTOMY;  Surgeon: Rollin Dover, MD;  Location: [facility];  Service: Endoscopy;;   SHOULDER ARTHROSCOPY W/ ROTATOR CUFF REPAIR Right 08/28/2017   TOTAL ABDOMINAL HYSTERECTOMY  2003   UPPER GI ENDOSCOPY     WRIST ARTHROSCOPY WITH DEBRIDEMENT Left 10/18/2020   Procedure: LEFT WRIST ARTHROSCOPY WITH DEBRIDEMENT;  Surgeon: Murrell Kuba, MD;  Location: [facility];  Service: Orthopedics;  Laterality: Left;  AXILLARY BLOCK   WRIST ARTHROSCOPY WITH DEBRIDEMENT Right 01/25/2022   Procedure: ARTHROSCOPY RIGHT WRIST WITH  DEBRIDEMENT/ SHRINKAGE;  Surgeon: Murrell Franky, MD;  Location: [facility];  Service: Orthopedics;  Laterality: Right;  Family History  Problem Relation Age of Onset   Breast cancer Mother    Heart attack Father    Diabetes Father    Congestive Heart Failure Father    Breast cancer Maternal Grandmother     Social History   Socioeconomic History   Marital status: Divorced    Spouse name: Not on file   Number of children: 3   Years of education: Not on file   Highest education level: Not on file  Occupational History   Not on file  Tobacco Use   Smoking status: Never   Smokeless tobacco: Never  Vaping Use   Vaping status: Never Used  Substance and Sexual Activity   Alcohol use: Never   Drug use: Never   Sexual activity: Not Currently    Birth control/protection: Surgical    Comment: Hysterectomy  Other Topics Concern   Not on file  Social History Narrative   ** Merged History Encounter **       Social Drivers of Health   Financial Resource Strain: Patient Declined (01/13/2024)   [facility] from [facility]   Overall Financial Resource Strain (CARDIA)    Difficulty of Paying Living Expenses: Patient declined  Food Insecurity: Patient Declined (01/13/2024)   Received from [facility]   Hunger Vital Sign    Within the past  12 months, you worried that your food would run out before you got the money to buy more.: Patient declined    Within the past 12 months, the food you bought just didn't last and you didn't have money to get more.: Patient declined  Transportation Needs: Patient Declined (01/13/2024)   Received [facility] - [facility]    Lack of Transportation (Medical): Patient declined    Lack of Transportation (Non-Medical): Patient declined  Physical Activity: Not on file  Stress: Not on file  Social Connections: Unknown (01/30/2022)   Received from [facility]   Social Network    Social Network: Not on file  Intimate Partner Violence: Not At Risk (11/08/2022)   Humiliation, Afraid, Rape, and Kick questionnaire    Fear of Current or Ex-Partner: No    Emotionally Abused: No    Physically Abused: No    Sexually Abused: No    Review of Systems  Constitutional:  Negative for fatigue.  Respiratory:  Negative for apnea and shortness of breath.   Psychiatric/Behavioral:  Negative for sleep disturbance.     Vitals:   04/10/24 1035  BP: 96/61  Pulse: 72  SpO2: 96%     Physical Exam Constitutional:      Appearance: She is obese.  HENT:     Head: Normocephalic.     Nose: Nose normal.     Mouth/Throat:     Mouth: Mucous membranes are moist.  Eyes:     General: No scleral icterus.    Pupils: Pupils are equal, round, and reactive to light.  Cardiovascular:     Rate and Rhythm: Normal rate and regular rhythm.     Heart sounds: No murmur heard.    No friction rub.  Pulmonary:     Effort: No respiratory distress.     Breath sounds: No stridor. No wheezing or rhonchi.  Abdominal:     General: There is no distension.     Palpations: There is no mass.     Tenderness: There is no abdominal tenderness.     Hernia: No hernia is present.  Musculoskeletal:     Cervical back:  No tenderness.  Neurological:     Mental Status: She is alert.  Psychiatric:        Mood and  Affect: Mood normal.     Data Reviewed: Download from the machine shows excellent compliance average use of 6 hours 26 minutes CPAP of 13 Residual AHI of 1.7  Assessment:  History of sleep apnea - CPAP is working well Nurse, learning disability is becoming dysfunctional  Compliance data at last visit showed optimal compliance with CPAP  Ambien  was ordered for insomnia during the last visit - She has been off the Ambien , currently does use Zoloft  and seems to be helping her sleep   Plan/Recommendations:  Continue using CPAP on a nightly basis  No change in CPAP pressure recommended at present  Placed order for new CPAP, CPAP of 13  Graded activities as tolerated  Weight loss efforts  Follow-up in 6 months  Jennet Epley MD [facility] 04/10/2024, 10:39 AM  CC: Elizbeth Leita Ruth, *

## 2024-04-10 NOTE — Patient Instructions (Signed)
 DME referral for new CPAP  CPAP of 13  Follow-up in 6 months  Call us  with significant concerns

## 2024-04-13 ENCOUNTER — Ambulatory Visit (HOSPITAL_COMMUNITY)
Admission: RE | Admit: 2024-04-13 | Discharge: 2024-04-13 | Disposition: A | Discharge status: Home / Self Care | Source: Ambulatory Visit | Attending: Gastroenterology | Admitting: Gastroenterology

## 2024-04-13 DIAGNOSIS — R1319 Other dysphagia: Secondary | ICD-10-CM | POA: Diagnosis present

## 2024-04-14 ENCOUNTER — Telehealth: Payer: Self-pay | Admitting: Pulmonary Disease

## 2024-04-14 ENCOUNTER — Ambulatory Visit
Admission: EM | Admit: 2024-04-14 | Discharge: 2024-04-14 | Disposition: A | Discharge status: Home / Self Care | Attending: Family Medicine | Admitting: Family Medicine

## 2024-04-14 DIAGNOSIS — G4733 Obstructive sleep apnea (adult) (pediatric): Secondary | ICD-10-CM

## 2024-04-14 DIAGNOSIS — M5442 Lumbago with sciatica, left side: Secondary | ICD-10-CM | POA: Diagnosis not present

## 2024-04-14 HISTORY — DX: Cardiac murmur, unspecified: R01.1

## 2024-04-14 MED ORDER — TIZANIDINE HCL 4 MG PO TABS: 4.0000 mg | ORAL_TABLET | Freq: Three times a day (TID) | ORAL | 0 refills | Status: DC | PRN

## 2024-04-14 NOTE — Telephone Encounter (Signed)
 Patient is on a CPAP of 13  Order to [facility] to be CPAP of 13

## 2024-04-14 NOTE — Telephone Encounter (Signed)
 New order has been placed. Nfn

## 2024-04-14 NOTE — ED Triage Notes (Signed)
 It started about 3 days ago with back pain (left lower) with radiation down left leg, I was able to dance Sunday and it didn't bother me but Monday morning it woke me up in my sleep, by Monday evening killing me (throbbing). No known/obvious injury.

## 2024-04-14 NOTE — Discharge Instructions (Signed)
  Take tizanidine  4 mg--1 every 8 hours as needed for muscle spasms; this medication can cause dizziness and sleepiness  You can continue to take the Percocet you have at home as needed.  Please follow-up with your orthopedic providers about this issue.

## 2024-04-14 NOTE — Telephone Encounter (Signed)
 Per [name] at [facility]-  Order has been received. However, we will need the order updated to show the numerical settings before we can process.

## 2024-04-14 NOTE — ED Provider Notes (Signed)
 [facility]    CSN: [id number] Arrival date & time: 04/14/24  1050      History   Chief Complaint Chief Complaint  Patient presents with   Pain    HPI Meghan [name] is a 58 y.o. female.   HPI  Here for pain in her left low back.  It began yesterday morning.  His throbbed at times and had some spasm.  She has also had some throbbing in her posterior left thigh also.  No numbness or tingling in her left leg.  No rash or fever or dysuria.  She is allergic to meloxicam and tramadol and codeine.  She is able to take Percocet and received a quantity of 30 and a prescription on July 23.  She states she has about 4 of those left. She has a history of atrial fibrillation and takes Xarelto .  She does not have diabetes  She states she does not think she is post take prednisone as it causes swelling and she takes fluid pills.  Last EGFR was 78.  No fall or trauma  No new bowel or bladder incontinence  Past Medical History:  Diagnosis Date   (HFpEF) heart failure with preserved ejection fraction (HCC) 01/2019   a.) Dx'd in [place] , [place]; b.) TTE 08/14/2022: EF 60-65%, mod LVH, mild LAE, triv MR   Anxiety    Asthma 05/69   Atrial fibrillation and flutter (HCC) 02/16/2019   a.) CHA2DS2VASc = 6 (sex, HFpEF, HTN, CVA x2, T2DM);  b.) s/p DCCV (200 J x 1) 08/22/2022; c.) s/p RF ablation (CTI) 11/02/2022; d.) rate/rhythm maintained on oral flecainide  + metoprolol  succinate; chronically anticoagulated with rivaroxaban    CHF (congestive heart failure) (HCC) 01/2019   Class 3 severe obesity due to excess calories with body mass index (BMI) of 50.0 to 59.9 in adult    DDD (degenerative disc disease), lumbar    GERD (gastroesophageal reflux disease)    Heart murmur 07/2017   Hiatal hernia    History of head injury    HTN (hypertension)    Hypercholesterolemia    Ileus (HCC)    Intractable nausea and vomiting 11/16/2020   Long term current use of anticoagulant    a.)  rivaroxaban    Macular degeneration of left eye    MDD (major depressive disorder)    OA (osteoarthritis)    OSA on CPAP    Pneumonia    Presence of IVC filter    PTSD (post-traumatic stress disorder)    Recurrent pulmonary emboli (HCC)    a.) s/p IVC filter placement 2003; b.) previously followed by hematology when living in [place] , [place]   Seizure disorder (HCC)    a.) last was in 2020 per pt report   Stroke (HCC) 02/1995   1989 and 1995 (left sided weakness)   T2DM (type 2 diabetes mellitus) (HCC)    Vitamin D deficiency     Patient Active Problem List   Diagnosis Date Noted   Vascular complication 11/07/2022   MDD (major depressive disorder), recurrent episode, moderate (HCC) 07/30/2022   GAD (generalized anxiety disorder) 07/30/2022   PTSD (post-traumatic stress disorder) 07/30/2022   Insomnia 05/24/2022   Arthritis of first metatarsophalangeal (MTP) joint of right foot 04/25/2022   Intractable nausea and vomiting 11/16/2020   Ileus (HCC) 11/14/2020   Fecal impaction (HCC) 11/14/2020   Left-sided weakness 09/29/2020   Asthma 09/29/2020   Pre-diabetes 09/29/2020   Pain in left finger(s) 07/12/2020   Seizure-like activity (HCC) 03/25/2020  History of head injury 03/25/2020   Obstructive sleep apnea syndrome 09/04/2019   Acute medial meniscus tear of right knee 07/10/2019   Class 3 severe obesity due to excess calories with serious comorbidity and body mass index (BMI) of 50.0 to 59.9 in adult 06/01/2019   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   Atrial fibrillation (HCC) 04/17/2019   History of pulmonary embolus (PE) 04/17/2019    Past Surgical History:  Procedure Laterality Date   A-FLUTTER ABLATION N/A 11/02/2022   Procedure: A-FLUTTER ABLATION;  Surgeon: Nancey Eulas BRAVO, MD;  Location: [facility];  Service: Cardiovascular;  Laterality: N/A;   ARTHRODESIS METATARSALPHALANGEAL JOINT (MTPJ) Left 02/25/2023   Procedure: ARTHRODESIS METATARSALPHALANGEAL JOINT  (MTPJ);  Surgeon: Tobie Franky SQUIBB, DPM;  Location: [facility];  Service: Podiatry;  Laterality: Left;   CARDIAC CATHETERIZATION  2017   in [facility] in [place]   CARDIOVERSION N/A 08/22/2022   Procedure: CARDIOVERSION;  Surgeon: Sheena Pugh, DO;  Location: [facility];  Service: Cardiovascular;  Laterality: N/A;   CESAREAN SECTION N/A 1989   CESAREAN SECTION N/A 1993   CHOLECYSTECTOMY  2003   COLONOSCOPY WITH PROPOFOL  N/A 06/05/2019   Procedure: COLONOSCOPY WITH PROPOFOL ;  Surgeon: Rollin Dover, MD;  Location: [facility];  Service: Endoscopy;  Laterality: N/A;   CYST REMOVAL WITH BONE GRAFT Left 10/18/2020   Procedure: BONE GRAFTING OF ENCHONDROMA MIDDLE PHANLANX OF LEFT MIDDLE FINGER;  Surgeon: Murrell Kuba, MD;  Location: [facility];  Service: Orthopedics;  Laterality: Left;  AXILLARY BLOCK   DIAGNOSTIC LAPAROSCOPY  2015; 2017   lap hernia repair x2   ENDOVENOUS ABLATION SAPHENOUS VEIN W/ LASER Left 08/31/2021   endovenous laser ablation left greater saphenous vein and stab phlebectomy 10-20 incisions left leg by Penne Colorado MD   HALLUX FUSION Right 05/28/2022   Procedure: HALLUX FUSION METATARSAL PHALANGEAL JOIT;  Surgeon: Tobie Franky SQUIBB, DPM;  Location: [facility];  Service: Podiatry;  Laterality: Right;  BLOCK   HERNIA REPAIR  2014, 2018   umbilical hernia repair   IVC FILTER INSERTION  2003   POLYPECTOMY  06/05/2019   Procedure: POLYPECTOMY;  Surgeon: Rollin Dover, MD;  Location: [facility];  Service: Endoscopy;;   SHOULDER ARTHROSCOPY W/ ROTATOR CUFF REPAIR Right 08/28/2017   TOTAL ABDOMINAL HYSTERECTOMY  2003   UPPER GI ENDOSCOPY     WRIST ARTHROSCOPY WITH DEBRIDEMENT Left 10/18/2020   Procedure: LEFT WRIST ARTHROSCOPY WITH DEBRIDEMENT;  Surgeon: Murrell Kuba, MD;  Location: [facility];  Service: Orthopedics;  Laterality: Left;  AXILLARY BLOCK   WRIST ARTHROSCOPY WITH DEBRIDEMENT Right 01/25/2022   Procedure: ARTHROSCOPY RIGHT WRIST WITH  DEBRIDEMENT/  SHRINKAGE;  Surgeon: Murrell Franky, MD;  Location: [facility];  Service: Orthopedics;  Laterality: Right;    OB History     Gravida  2   Para  2   Term  2   Preterm      AB      Living         SAB      IAB      Ectopic      Multiple      Live Births               Home Medications    Prior to Admission medications   Medication Sig Start Date End Date Taking? Authorizing Provider  acetaminophen  (TYLENOL ) 500 MG tablet Take 2,000 mg by mouth every 6 (six) hours as needed.   Yes [provider]  rizatriptan (MAXALT)  10 MG tablet Take 5 mg by mouth as needed. 04/02/24  Yes [provider]  tiZANidine  (ZANAFLEX ) 4 MG tablet Take 1 tablet (4 mg total) by mouth every 8 (eight) hours as needed for muscle spasms. 04/14/24  Yes Khalib Fendley K, MD  topiramate (TOPAMAX) 50 MG tablet Take 50 mg by mouth daily. 04/02/24  Yes [provider]  albuterol  (VENTOLIN  HFA) 108 (90 Base) MCG/ACT inhaler Inhale 2 puffs into the lungs every 6 (six) hours as needed for wheezing or shortness of breath.    [provider]  amitriptyline (ELAVIL) 10 MG tablet Take 10 mg by mouth at bedtime. 01/01/24   [provider]  bumetanide  (BUMEX ) 1 MG tablet Take 1 tablet (1 mg total) by mouth daily for 10 days. 12/25/22 04/10/24  Teresa Shelba SAUNDERS, NP  Cholecalciferol (VITAMIN D-3) 125 MCG (5000 UT) TABS Take 5,000 Units by mouth daily.    [provider]  lidocaine  (LIDODERM ) 5 % Place 1 patch onto the skin daily. Remove & Discard patch within 12 hours or as directed by MD 02/19/24   Minnie Tinnie BRAVO, PA  MOUNJARO 7.5 MG/0.5ML Pen Inject 7.5 mg into the skin once a week. 09/04/23   [provider]  Omega-3 Fatty Acids (FISH OIL) 1200 MG CAPS Take 1,200 mg by mouth daily with breakfast.    [provider]  omeprazole (PRILOSEC) 40 MG capsule Take 40 mg by mouth 2 (two) times daily. 08/17/20   [provider]  oxyCODONE -acetaminophen   (PERCOCET) 5-325 MG tablet Take 1-2 tablets by mouth every 4 (four) hours as needed for severe pain (pain score 7-10). 03/11/24   Tobie Franky SQUIBB, DPM  oxyCODONE -acetaminophen  (PERCOCET) 5-325 MG tablet Take 1 tablet by mouth every 4 (four) hours as needed for severe pain (pain score 7-10). 04/08/24   Tobie Franky SQUIBB, DPM  rivaroxaban  (XARELTO ) 20 MG TABS tablet Take 20 mg by mouth at bedtime.     [provider]  rosuvastatin (CRESTOR) 10 MG tablet Take 10 mg by mouth at bedtime. 07/10/23   [provider]  sertraline  (ZOLOFT ) 50 MG tablet Take 50 mg by mouth in the morning. 01/01/21   [provider]  zolpidem  (AMBIEN  CR) 6.25 MG CR tablet Take 1 tablet (6.25 mg total) by mouth at bedtime. 12/25/23   Neda Jennet LABOR, MD    Family History Family History  Problem Relation Age of Onset   Breast cancer Mother    Heart attack Father    Diabetes Father    Congestive Heart Failure Father    Clotting disorder Father    Heart disease Father    Heart failure Father    Hypertension Father    Breast cancer Maternal Grandmother    Arrhythmia Paternal Grandmother    Heart failure Brother     Social History Social History   Tobacco Use   Smoking status: Never   Smokeless tobacco: Never  Vaping Use   Vaping status: Never Used  Substance Use Topics   Alcohol use: Never   Drug use: Never     Allergies   Acetaminophen -codeine, Bee venom, Meloxicam, Tramadol, Codeine, Coconut (cocos nucifera), and Tomato   Review of Systems Review of Systems   Physical Exam Triage Vital Signs ED Triage Vitals  Encounter Vitals Group     BP 04/14/24 1116 119/77     Girls Systolic BP Percentile --      Girls Diastolic BP Percentile --      Boys Systolic  BP Percentile --      Boys Diastolic BP Percentile --      Pulse Rate 04/14/24 1116 75     Resp 04/14/24 1116 20     Temp 04/14/24 1116 98.7 F (37.1 C)     Temp Source 04/14/24 1116 Oral     SpO2 04/14/24 1116 99 %      Weight 04/14/24 1112 299 lb 13.2 oz (136 kg)     Height 04/14/24 1112 5' 5 (1.651 m)     Head Circumference --      Peak Flow --      Pain Score 04/14/24 1109 10     Pain Loc --      Pain Education --      Exclude from Growth Chart --    No data found.  Updated Vital Signs BP 119/77 (BP Location: Left Wrist)   Pulse 75   Temp 98.7 F (37.1 C) (Oral)   Resp 20   Ht 5' 5 (1.651 m)   Wt 136 kg   SpO2 99%   BMI 49.89 kg/m   Visual Acuity Right Eye Distance:   Left Eye Distance:   Bilateral Distance:    Right Eye Near:   Left Eye Near:    Bilateral Near:     Physical Exam Vitals reviewed.  Constitutional:      General: She is not in acute distress.    Appearance: She is not ill-appearing, toxic-appearing or diaphoretic.  Musculoskeletal:     Cervical back: Neck supple.     Comments: Straight leg raise causes some pain in her back.  There is no rash or deformity.  Lymphadenopathy:     Cervical: No cervical adenopathy.  Skin:    Coloration: Skin is not pale.  Neurological:     General: No focal deficit present.     Mental Status: She is alert and oriented to person, place, and time.  Psychiatric:        Behavior: Behavior normal.      UC Treatments / Results  Labs (all labs ordered are listed, but only abnormal results are displayed) Labs Reviewed - No data to display  EKG   Radiology DG ESOPHAGUS W DOUBLE CM (HD) Result Date: 04/13/2024 CLINICAL DATA:  Patient with history of GERD, esophageal dilation with complaint of dysphagia and globus sensation. EXAM: ESOPHAGUS/BARIUM SWALLOW/TABLET STUDY TECHNIQUE: Combined double and single contrast examination was performed using effervescent crystals, high-density barium, and thin liquid barium. This exam was performed by Laymon Coast, NP, and was supervised and interpreted by Dr. Jenna. FLUOROSCOPY: Radiation Exposure Index (as provided by the fluoroscopic device): 50.6 mGy Kerma COMPARISON:  None  Available. FINDINGS: Swallowing: Appears normal. No vestibular penetration or aspiration seen. Pharynx: Unremarkable. Esophagus: Dilated appearance, especially the distal esophagus. Esophageal motility: Decreased peristalsis resulting in contrast stasis in the distal esophagus noted. Hiatal Hernia: None. Gastroesophageal reflux: Unable to fully clear contrast stasis from the distal esophagus prior to evaluating for reflux despite providing water. Contrast from the distal esophagus is seen refluxing to the mid esophagus during provacative maneuvers. Ingested 13 mm barium tablet: Passed normally. Other: None. IMPRESSION: The distal esophagus appears dilated. Dysmotility is noted; decreased peristalsis is seen resulting in contrast stasis in the distal esophagus. Reflux is suspected but may be more closely related to contrast stasis. Otherwise normal esophagram. Performed by Laymon Coast, NP under the supervision of Dr. Jenna Electronically Signed   By: Cordella Jenna   On: 04/13/2024 10:51  Procedures Procedures (including critical care time)  Medications Ordered in UC Medications - No data to display  Initial Impression / Assessment and Plan / UC Course  I have reviewed the triage vital signs and the nursing notes.  Pertinent labs & imaging results that were available during my care of the patient were reviewed by me and considered in my medical decision making (see chart for details).     I have discussed with her that with her allergy list and her being on Xarelto , I do not think it is safe to give her a Toradol injection here.  She still has a few Percocets at home and that is not any more powerful than anything we can give her here.  Tizanidine  was prescribed for muscle relaxer and she states she is taken that without problems previously.  She will follow-up with her orthopedic office in the next week or 2. Final Clinical Impressions(s) / UC Diagnoses   Final diagnoses:  Acute  left-sided low back pain with left-sided sciatica     Discharge Instructions       Take tizanidine  4 mg--1 every 8 hours as needed for muscle spasms; this medication can cause dizziness and sleepiness  You can continue to take the Percocet you have at home as needed.  Please follow-up with your orthopedic providers about this issue.     ED Prescriptions     Medication Sig Dispense Auth. Provider   tiZANidine  (ZANAFLEX ) 4 MG tablet Take 1 tablet (4 mg total) by mouth every 8 (eight) hours as needed for muscle spasms. 15 tablet Ayo Smoak, Sharlet POUR, MD      I have reviewed the PDMP during this encounter.   Vonna Sharlet POUR, MD 04/14/24 [phone]

## 2024-04-15 NOTE — Telephone Encounter (Signed)
 Order has been sent to DME. [facility]

## 2024-04-20 ENCOUNTER — Ambulatory Visit (INDEPENDENT_AMBULATORY_CARE_PROVIDER_SITE_OTHER): Admitting: Licensed Clinical Social Worker

## 2024-04-20 DIAGNOSIS — F331 Major depressive disorder, recurrent, moderate: Secondary | ICD-10-CM | POA: Diagnosis not present

## 2024-04-20 DIAGNOSIS — F4310 Post-traumatic stress disorder, unspecified: Secondary | ICD-10-CM

## 2024-04-20 DIAGNOSIS — F411 Generalized anxiety disorder: Secondary | ICD-10-CM | POA: Diagnosis not present

## 2024-04-20 NOTE — Progress Notes (Signed)
 Virtual Visit via Video Note   I connected with Meghan [name] on 04/20/24 at 2:00pm by video enabled telemedicine application and verified that I am speaking with the correct person using two identifiers.   I discussed the limitations, risks, security and privacy concerns of performing an evaluation and management service by video and the availability of in person appointments. I also discussed with the patient that there may be a patient responsible charge related to this service. The patient expressed understanding and agreed to proceed.   I discussed the assessment and treatment plan with the patient. The patient was provided an opportunity to ask questions and all were answered. The patient agreed with the plan and demonstrated an understanding of the instructions.   The patient was advised to call back or seek an in-person evaluation if the symptoms worsen or if the condition fails to improve as anticipated.   I provided 36 minutes of non-face-to-face time during this encounter.   Meghan Ricker, LCSW, LCAS _______________________ THERAPIST PROGRESS NOTE   Session Time:  2:00pm -  2:36pm    Location: Patient: Urgent Care Provider: Home Office    Participation Level: Active    Behavioral Response: Alert, casually dressed, euthymic mood/affect    Type of Therapy:  Individual Therapy   Treatment Goals addressed: Mood management; Medication management; Maintaining part-time employment    Progress Towards Goals: Progressing    Interventions: CBT, problem solving, strengths based    Summary: Meghan [name] is a 58 year old divorced African American female that presented for therapy appointment today with diagnoses of Major Depressive Disorder, recurrent, moderate, Generalized Anxiety Disorder and PTSD.      Suicidal/Homicidal: None; without plan or intent                                                                                                                Therapist Response:   Clinician met with Meghan [name] for virtual therapy session and assessed for safety, sobriety, and medication compliance.  Meghan [name] presented for appointment on time and was alert, oriented x5, with no evidence or self-report of active SI/HI or A/V H.  Meghan [name] reported ongoing compliance with medication and denied any use of alcohol or illicit substances.  Clinician inquired about Meghan [name] emotional ratings today, as well as any significant changes in thoughts, feelings, or behavior since last check-in.  Meghan [name] reported scores of 0/10 for depression, 0/10 for anxiety, and 0/10 for anger/irritability.  Meghan [name] denied any recent outbursts or panic attacks.  Meghan [name] reported that a recent success has been planning for her grandson's birthday tomorrow, which she is excited about.  She reported that a struggle was having to assist her daughter today in getting to urgent care, which was where she was during session.  Meghan [name] reported that she was in a quiet place where conversation could be held privately.  Meghan [name] reported that an additional struggle this week was learning that her position at her part-time job was changing, and she may need to find a new one.  Clinician inquired about whether Meghan [name] could utilize assertive communication skills taught in previous therapy sessions to advocate for management keeping present position.  Meghan [name] reported that she attempted to outreach leadership at her job today, but has not received a response, and does not take this as a promising sign.  Clinician inquired about whether Meghan [name] has spoken with any supports in her network about recent job struggles, and if they are aware of any open positions that might align with her personal strengths and work experience.  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  Clinician assisted Meghan [name] in running cost benefit analysis regarding whether this daycare job has a schedule, location, salary, and other  important factors which align with present needs.  Meghan [name] participated in analysis and reported that there are several benefits, including the fact that it isn't far away, she would only work 3 days a week, could maintain disability and insurance, although there would be a slight pay decrease.  Meghan [name] reported that she would plan to talk to this friend more at next church meeting, but also consider looking at posting online.  Meghan [name] reported that she will plan to update her resume during downtime, but worried about what to say if asked about gap in work history.  Clinician utilized material on interviewing skills to assist, explaining how its best to be honest, and positive, but concise in explaining work history.  Progress is evidenced by Meghan [name]'s consistent therapy attendance, medication compliance, low emotional ratings, and willingness to address important stressors such as employment issues with therapist.  Meghan [name] reported that will spend the week looking at her options and using support network to aid in job search.  Clinician will continue to monitor.          Plan: Follow up again in 1-2 weeks.    Diagnosis: Major depressive disorder, recurrent, moderate; Generalized Anxiety Disorder; and PTSD    Collaboration of Care:   No collaboration of care required for this visit.                                                   Patient/Guardian was advised Release of Information must be obtained prior to any record release in order to collaborate their care with an outside provider. Patient/Guardian was advised if they have not already done so to contact the registration department to sign all necessary forms in order for us  to release information regarding their care.    Consent: Patient/Guardian gives verbal consent for treatment and assignment of benefits for services provided during this visit. Patient/Guardian expressed understanding and agreed to proceed.   Meghan Ricker, LCSW, LCAS 04/20/24

## 2024-05-04 ENCOUNTER — Ambulatory Visit (INDEPENDENT_AMBULATORY_CARE_PROVIDER_SITE_OTHER): Admitting: Licensed Clinical Social Worker

## 2024-05-04 ENCOUNTER — Ambulatory Visit (HOSPITAL_COMMUNITY)
Admission: EM | Admit: 2024-05-04 | Discharge: 2024-05-04 | Disposition: A | Discharge status: Home / Self Care | Attending: Family Medicine | Admitting: Family Medicine

## 2024-05-04 DIAGNOSIS — F332 Major depressive disorder, recurrent severe without psychotic features: Secondary | ICD-10-CM | POA: Insufficient documentation

## 2024-05-04 DIAGNOSIS — F411 Generalized anxiety disorder: Secondary | ICD-10-CM

## 2024-05-04 DIAGNOSIS — R454 Irritability and anger: Secondary | ICD-10-CM | POA: Diagnosis not present

## 2024-05-04 DIAGNOSIS — F331 Major depressive disorder, recurrent, moderate: Secondary | ICD-10-CM | POA: Diagnosis not present

## 2024-05-04 DIAGNOSIS — F4310 Post-traumatic stress disorder, unspecified: Secondary | ICD-10-CM

## 2024-05-04 NOTE — ED Provider Notes (Signed)
 Behavioral Health Urgent Care Medical Screening Exam  Patient Name: Meghan [name] MRN: [id number] Date of Evaluation: 05/04/24 Chief Complaint:  I don't want to go back to the angry person I used to be Diagnosis:  Final diagnoses:  Difficulty controlling anger  Severe episode of recurrent major depressive disorder, without psychotic features (HCC)  GAD (generalized anxiety disorder)    History of Present illness: Meghan [name] is a 58 y.o. female, MDD, GAD, OSA, Inflammatory Arthritis, Atrial Fibrillation/flutter, present today reporting recently struggling with management of her anger.  Patient presented to [facility] as a walk in unaccompanied and is voluntary.   Meghan [name], 58 y.o., female patient seen face to face by this provider, consulted with Dr. Lawrnce ; and chart reviewed on 05/04/24.    On evaluation Meghan [name] reports increased irritability and anger over the last 2 days. Patient was hoping to receive on-demand therapy to bridge her to her next appointment with her established therapist Meghan [name], with [facility]. Patient endorses a good,stable, therapeutic relationship with her therapist and medication compliance with psychotropic medications.   Endorses notable increase irritability due yesterday after a brief encounter with a lady at church she has had some ongoing disagreement with.  She reports over the last few months she has attempted to speak with the lady regarding how she felt however the lady is not open to having an adult conversation per patient.  She reports this irritation and irritability spilled over into her home where her adult daughter is living with her teenage son over the last 3 months.  She reports that she became upset and raised her voice yesterday and today her grandson refused to come near her which caused her to become even more angry and she yelled and threw things in the house and to calm herself down and she called her friend  to pick her up and bring her here to [facility] to speak with someone.  Patient reports a long history of severe explosive anger.  Reports in 2019 a lot of stressors came down on her and she was hospitalized psychiatrically for about 2 weeks and she endorses at that time she did have suicidal thoughts.  She reports after leaving her home in [place]  and relocating to [place]  alone and get an established with good outpatient behavioral health services she has not had any problems with anger and overall her mood has been well-controlled with therapy and medication management.  She is fearful that her recent behavior over the last 24 hours could be indication that her the angry person I used to be may be resurfacing.  On chart review discussed with patient her next appointment with therapist is scheduled one week from today.  Patient also notes recent stressors including her grandson and daughter currently residing in the home with her.  She lives in a studio apartment and has lived there for 5 years on her own and has grown comfortable in her day-to-day routine.  She reports that now that her daughter and grandson are there at times her grandson is up late playing video games and there is increased noise and also increased clutter which also may be contributing to some of her irritability.  Patient endorses that her daughter and grandson have located an apartment is just waiting to be approved, however she is unsure of how long they will remain at her current home.  She reports when she became upset today she did apologize  to both her daughter and grandson prior to leaving the home to de-escalate the situation.  Patient identifies several members of her church family is good sources of support.  She reports that she can reach out to her pastor if she is unable to establish an appointment with her therapist in between therapy appointments if the need should arise.  She endorses great support and feels she can  comfortably speak with at least 3 members of her church family.  Patient denies any other concerns today with the exception she would like some assistance in finding her grandson a therapist psychiatric medication management provider here in [place] .  Advised patient I could assist with providing some resources.  Patient denies any other concerns.  During evaluation Meghan [name] is sitting upright in no acute distress.  She is alert/oriented x 4; calm/cooperative; and mood congruent with affect.  She is speaking in a clear tone at moderate volume, and normal pace; with good eye contact.  Her thought process is coherent and relevant; There is no indication that she is currently responding to internal/external stimuli or experiencing delusional thought content; and she has denied suicidal/self-harm/homicidal ideation, psychosis, and paranoia.  Patient has remained calm throughout assessment and has answered questions appropriately.    Patient is able to contract for safety and was seeking on demand therapy services or to obtain an earlier session with her therapist, Meghan [name]     Flowsheet Row ED from 05/04/2024 in [facility] from 04/14/2024 in [facility] at [facility] ED from 03/09/2024 in [facility] at [facility]  C-SSRS RISK CATEGORY Error: Q7 should not be populated when Q6 is No Moderate Risk No Risk    Psychiatric Specialty Exam  Presentation  General Appearance:Appropriate for Environment  Eye Contact:Good  Speech:Clear and Coherent  Speech Volume:Normal  Handedness:-- (did not assess)   Mood and Affect  Mood:Euthymic  Affect:Appropriate   Thought Process  Thought Processes:Coherent  Descriptions of Associations:Intact  Orientation:Full (Time, Place and Person)  Thought Content:WDL  Diagnosis of Schizophrenia or Schizoaffective disorder in past: No    Hallucinations:None  Ideas of Reference:None  Suicidal Thoughts:No  Homicidal Thoughts:No   Sensorium  Memory:Immediate Good; Recent Good; Remote Good  Judgment:Good  Insight:Good   Executive Functions  Concentration:Good  Attention Span:Good  Recall:Good  Fund of Knowledge:Good  Language:Good   Psychomotor Activity  Psychomotor Activity:Normal   Assets  Assets:Communication Skills; Desire for Improvement; Housing; Talents/Skills; Physical Health; Resilience; Financial Resources/Insurance   Sleep  Sleep:Good  Number of hours:  Physical Exam: Physical Exam Constitutional:      General: She is not in acute distress.    Appearance: Normal appearance. She is not ill-appearing.  HENT:     Head: Normocephalic and atraumatic.     Nose: Nose normal.  Eyes:     Extraocular Movements: Extraocular movements intact.     Conjunctiva/sclera: Conjunctivae normal.     Pupils: Pupils are equal, round, and reactive to light.  Cardiovascular:     Rate and Rhythm: Normal rate and regular rhythm.  Pulmonary:     Effort: Pulmonary effort is normal.     Breath sounds: Normal breath sounds.  Musculoskeletal:     Cervical back: Normal range of motion and neck supple.  Skin:    General: Skin is warm and dry.  Neurological:     General: No focal deficit present.     Mental Status: She is alert  and oriented to person, place, and time.     Review of Systems  Psychiatric/Behavioral:         Irritability and Anger    Blood pressure (!) 122/59, pulse 74, temperature 98.7 F (37.1 C), temperature source Oral, resp. rate 19. There is no height or weight on file to calculate BMI.  Musculoskeletal: Strength & Muscle Tone: within normal limits Gait & Station: normal Patient leans: N/A   [facility] MSE Discharge Disposition for Follow up and Recommendations: Based on my evaluation the patient does not appear to have an emergency medical condition and can be discharged with  resources and follow up care in outpatient services for Patient requested resources for her grandson for outpatient medication management and therapy services-information provided to follow up with [facility].  -Sent a secure messages to patient established therapist, requesting an earlier appointment than 05/11/2024 if available patient is aware if not available to keep her appointment which is already scheduled with Meghan [name]. - Patient denies any acute safety concerns denies that she is suicidal or homicidal. - Discussed with patient positive coping mechanisms and activities away from home which can aid in reducing stress related to her current situation of grandson and daughter returning to live at her home temporarily. - Patient also identified multiple individuals within her church family that are very supportive of her and that she can reach out to if she feels that she needs to speak with someone immediately in order to reduce stress and irritability. Patient encouraged to return as needed to this facility if any acute crisis develops.    Suzen Lesches, NP 05/04/2024, 1:05 PM

## 2024-05-04 NOTE — Progress Notes (Signed)
 Virtual Visit via Video Note   I connected with Monesha S. Pablo on 05/04/24 at 2:00pm by video enabled telemedicine application and verified that I am speaking with the correct person using two identifiers.   I discussed the limitations, risks, security and privacy concerns of performing an evaluation and management service by video and the availability of in person appointments. I also discussed with the patient that there may be a patient responsible charge related to this service. The patient expressed understanding and agreed to proceed.   I discussed the assessment and treatment plan with the patient. The patient was provided an opportunity to ask questions and all were answered. The patient agreed with the plan and demonstrated an understanding of the instructions.   The patient was advised to call back or seek an in-person evaluation if the symptoms worsen or if the condition fails to improve as anticipated.   I provided 52 minutes of non-face-to-face time during this encounter.   [name], LCSW, LCAS _______________________ THERAPIST PROGRESS NOTE   Session Time:  2:00pm -  2:52pm    Location: Patient: Patient home Provider: [facility]    Participation Level: Active    Behavioral Response: Alert, casually dressed, angry mood/affect    Type of Therapy:  Individual Therapy   Treatment Goals addressed: Mood management; Medication management; Safety planning    Progress Towards Goals: Progressing    Interventions: CBT, anger management     Summary: Kerington S. Heasley is a 58 year old divorced African American female that presented for therapy appointment today with diagnoses of Major Depressive Disorder, recurrent, moderate, Generalized Anxiety Disorder and PTSD.      Suicidal/Homicidal: None; without plan or intent                                                                                                                Therapist Response:  Clinician met with Alizaya for  virtual therapy appointment and assessed for safety, sobriety, and medication compliance.  Jamilee presented for session on time and was alert, oriented x5, with no evidence or self-report of active SI/HI or A/V H.  Anderia reported ongoing compliance with medication and denied any use of alcohol or illicit substances.  Clinician inquired about Lexey's current emotional ratings, as well as any significant changes in thoughts, feelings, or behavior since previous check-in.  Ayelen reported scores of 10/10 for depression, 10/10 for anxiety, and 10/10 for anger/irritability.  Aseneth denied any recent panic attacks.  Sia reported that a recent struggle was going to the behavioral health hospital today.  Clinician praised Ettamae for following safety plan, and inquired about what triggered her to seek help.  Kenny reported that she has been having trouble controlling her anger, and she had an outburst toward her grandson, who no longer wants to talk to her now.  Siren stated I've had a lot of anger in me and it wasn't his fault, it was mine. Clinician revisited anger management handout with Arneda in session to assist.  This handout  explained how anger tends to be an emotion that is easily identifiable due to outward behavior such as frequent emotional outbursts, but can conceal other difficult emotions under the surface if compartmentalization is used.  It was also noted that triggering people, places, and things can influence this emotion, as well as potential outbursts.  A list of common emotions linked to anger was provided, and Empress was tasked with identifying which ones could be having a present influence, in addition to related triggers.  Clinician also encouraged Merilyn to consider self-care activities which could offer an appropriate stress outlet, and explored the subject with a list of various hobbies that could be helpful to include in routine.  Intervention was effective, as evidenced by Krystianna actively engaging  in discussion on subject, reporting that she has been trying to compartmentalize numerous distressing feelings over recent weeks, including sadness, disappointment, loneliness, hurt, insecurity, stress, tired, and shame.  She reported that the biggest triggers have been dealing with a difficult person at her church that reminds her of someone from the past, and sharing the small space with family, which could put her at risk of homelessness if they aren't able to follow expectations from landlord.  Clinician inquired about whether Roselene would benefit from a higher level of care, and return to group therapy for support and skills building. Payeton declined this suggestion, and reported that she feels like she just needs to be more consistent with therapy and honest with her feelings to avoid having an outburst again.  She reported that she would talk to her sister this evening for more support.  Clinician will continue to monitor.          Plan: Follow up again in 1-2 weeks.    Diagnosis: Major depressive disorder, recurrent, moderate; Generalized Anxiety Disorder; and PTSD    Collaboration of Care:   No collaboration of care required for this visit.                                                   Patient/Guardian was advised Release of Information must be obtained prior to any record release in order to collaborate their care with an outside provider. Patient/Guardian was advised if they have not already done so to contact the registration department to sign all necessary forms in order for us  to release information regarding their care.    Consent: Patient/Guardian gives verbal consent for treatment and assignment of benefits for services provided during this visit. Patient/Guardian expressed understanding and agreed to proceed.   [name], LCSW, LCAS 05/04/24

## 2024-05-04 NOTE — Discharge Instructions (Addendum)
 I have reached out to [facility] your therapist requesting for him to review his schedule for an earlier appointment. His office will reach out if they're able to accommodate seeing your sooner.
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
## 2024-05-06 ENCOUNTER — Ambulatory Visit (INDEPENDENT_AMBULATORY_CARE_PROVIDER_SITE_OTHER): Admitting: Podiatry

## 2024-05-06 DIAGNOSIS — M1990 Unspecified osteoarthritis, unspecified site: Secondary | ICD-10-CM

## 2024-05-06 MED ORDER — CELECOXIB 50 MG PO CAPS: 50.0000 mg | ORAL_CAPSULE | Freq: Two times a day (BID) | ORAL | 0 refills | Status: DC

## 2024-05-06 NOTE — Progress Notes (Signed)
 Subjective:  Patient ID: Meghan [name], female    DOB: [birth year],  MRN: [id number]  Chief Complaint  Patient presents with   Arthritis    58 y.o. female presents with the above complaint.  Patient presents with complaint of right second metatarsophalangeal joint injury.  She states that she was walking and fell along when she had her pain shooting through the big second toe.  She went to get it evaluated she noticed it to go up.  She wanted to discuss treatment options there is been causing a lot of discomfort happening Saturday.  She is currently not immobilized.  Denies any other acute complaints   Review of Systems: Negative except as noted in the HPI. Denies N/V/F/Ch.  Past Medical History:  Diagnosis Date   (HFpEF) heart failure with preserved ejection fraction (HCC) 01/2019   a.) Dx'd in [place]; b.) TTE 08/14/2022: EF 60-65%, mod LVH, mild LAE, triv MR   Anxiety    Asthma 05/69   Atrial fibrillation and flutter (HCC) 02/16/2019   a.) CHA2DS2VASc = 6 (sex, HFpEF, HTN, CVA x2, T2DM);  b.) s/p DCCV (200 J x 1) 08/22/2022; c.) s/p RF ablation (CTI) 11/02/2022; d.) rate/rhythm maintained on oral flecainide  + metoprolol  succinate; chronically anticoagulated with rivaroxaban    CHF (congestive heart failure) (HCC) 01/2019   Class 3 severe obesity due to excess calories with body mass index (BMI) of 50.0 to 59.9 in adult    DDD (degenerative disc disease), lumbar    GERD (gastroesophageal reflux disease)    Heart murmur 07/2017   Hiatal hernia    History of head injury    HTN (hypertension)    Hypercholesterolemia    Ileus (HCC)    Intractable nausea and vomiting 11/16/2020   Long term current use of anticoagulant    a.) rivaroxaban    Macular degeneration of left eye    MDD (major depressive disorder)    OA (osteoarthritis)    OSA on CPAP    Pneumonia    Presence of IVC filter    PTSD (post-traumatic stress disorder)    Recurrent pulmonary emboli (HCC)    a.) s/p IVC  filter placement 2003; b.) previously followed by hematology when living in [place]   Seizure disorder (HCC)    a.) last was in 2020 per pt report   Stroke (HCC) 02/1995   1989 and 1995 (left sided weakness)   T2DM (type 2 diabetes mellitus) (HCC)    Vitamin D deficiency     Current Outpatient Medications:    acetaminophen  (TYLENOL ) 500 MG tablet, Take 2,000 mg by mouth every 6 (six) hours as needed., Disp: , Rfl:    albuterol  (VENTOLIN  HFA) 108 (90 Base) MCG/ACT inhaler, Inhale 2 puffs into the lungs every 6 (six) hours as needed for wheezing or shortness of breath., Disp: , Rfl:    amitriptyline (ELAVIL) 10 MG tablet, Take 10 mg by mouth at bedtime., Disp: , Rfl:    bumetanide  (BUMEX ) 1 MG tablet, Take 1 tablet (1 mg total) by mouth daily for 10 days., Disp: 10 tablet, Rfl: 0   Cholecalciferol (VITAMIN D-3) 125 MCG (5000 UT) TABS, Take 5,000 Units by mouth daily., Disp: , Rfl:    lidocaine  (LIDODERM ) 5 %, Place 1 patch onto the skin daily. Remove & Discard patch within 12 hours or as directed by MD, Disp: 30 patch, Rfl: 0   MOUNJARO 7.5 MG/0.5ML Pen, Inject 7.5 mg into the skin once a week., Disp: , Rfl:  Omega-3 Fatty Acids (FISH OIL) 1200 MG CAPS, Take 1,200 mg by mouth daily with breakfast., Disp: , Rfl:    omeprazole (PRILOSEC) 40 MG capsule, Take 40 mg by mouth 2 (two) times daily., Disp: , Rfl:    oxyCODONE -acetaminophen  (PERCOCET) 5-325 MG tablet, Take 1-2 tablets by mouth every 4 (four) hours as needed for severe pain (pain score 7-10)., Disp: 30 tablet, Rfl: 0   oxyCODONE -acetaminophen  (PERCOCET) 5-325 MG tablet, Take 1 tablet by mouth every 4 (four) hours as needed for severe pain (pain score 7-10)., Disp: 30 tablet, Rfl: 0   rivaroxaban  (XARELTO ) 20 MG TABS tablet, Take 20 mg by mouth at bedtime. , Disp: , Rfl:    rizatriptan (MAXALT) 10 MG tablet, Take 5 mg by mouth as needed., Disp: , Rfl:    rosuvastatin (CRESTOR) 10 MG tablet, Take 10 mg by mouth at bedtime., Disp: ,  Rfl:    sertraline  (ZOLOFT ) 50 MG tablet, Take 50 mg by mouth in the morning., Disp: , Rfl:    tiZANidine  (ZANAFLEX ) 4 MG tablet, Take 1 tablet (4 mg total) by mouth every 8 (eight) hours as needed for muscle spasms., Disp: 15 tablet, Rfl: 0   topiramate (TOPAMAX) 50 MG tablet, Take 50 mg by mouth daily., Disp: , Rfl:    zolpidem  (AMBIEN  CR) 6.25 MG CR tablet, Take 1 tablet (6.25 mg total) by mouth at bedtime., Disp: 30 tablet, Rfl: 2  Social History   Tobacco Use  Smoking Status Never  Smokeless Tobacco Never    Allergies  Allergen Reactions   Acetaminophen -Codeine Swelling, Rash and Other (See Comments)    Tylenol  with Codeine, Tylenol  #3,  (facial swelling, hives)   Bee Venom Anaphylaxis   Meloxicam Anaphylaxis and Rash   Tramadol Hives and Other (See Comments)    Patient stated I was trippin' and I do not want that ever again   Codeine Hives and Swelling   Coconut (Cocos Nucifera) Rash and Other (See Comments)    ANY coconut products    Tomato Rash   Objective:  There were no vitals filed for this visit. There is no height or weight on file to calculate BMI. Constitutional Well developed. Well nourished.  Vascular Dorsalis pedis pulses palpable bilaterally. Posterior tibial pulses palpable bilaterally. Capillary refill normal to all digits.  No cyanosis or clubbing noted. Pedal hair growth normal.  Neurologic Normal speech. Oriented to person, place, and time. Epicritic sensation to light touch grossly present bilaterally.  Dermatologic Nails well groomed and normal in appearance. No open wounds. No skin lesions.  Orthopedic: Pain on palpation right second metatarsophalangeal joint pain mild pain with range of motion of the joint.  Deep intra-articular pain noted.  No crepitus noted.   Radiographs: None Assessment:   No diagnosis found.  Plan:  Patient was evaluated and treated and all questions answered.    Bilateral generalized edema with a possible  concern for inflammatory arthritis - All questions and concerns were discussed with the patient in extensive detail given the presence of bilateral ankle swelling as well as forefoot swelling the only positive lab markers were ESR for inflammation at this time patient would benefit from rheumatology referral as patient has generalized pain at the bilateral ankle - Rheumatology appointment scheduled for next Monday.  Will continue to follow.  No follow-ups on file.

## 2024-05-06 NOTE — Addendum Note (Signed)
 Addended by: Delfina Schreurs on: 05/06/2024 03:19 PM   Modules accepted: Orders

## 2024-05-07 ENCOUNTER — Other Ambulatory Visit: Payer: Self-pay | Admitting: Podiatry

## 2024-05-10 NOTE — Progress Notes (Unsigned)
 Office Visit Note  Patient: Meghan [name]             Date of Birth: 04/24/1966           MRN: [id number]             PCP: Elizbeth Leita Ruth, FNP Referring: Tobie Franky SQUIBB, DPM Visit Date: 05/11/2024 Occupation: Prior school teacher  Subjective:  New Patient (Initial Visit) (Abnormal labs and joint pain. )   History of Present Illness: Meghan [name] is a 58 y.o. female elevated inflammatory markers.   She presents with her daughter. She admits to muscle and joint pain that has been going on for awhile, but started to get worse a couple of months ago. She denies relief from celebrex , tylenol , other pain medications.   She admits to muscle pain, worse in the AM, does not always improve with activity. She admits to joint pain that is also worse in the AM. She admits to AM stiffness, lasting about an hour. She denies stiffness around the hip girdle, admits it is more prominent in distal lower extremities and proximal in the upper extremities. She admits to ankle swelling that started in June. She admits to some chest pain that started recently, with some associated palpitations. She does admit to some increase in fluid in her legs as well.   She denies miscarriage. Admits to hx of multiple blood clots, on xarelto .  She admits to some change in her symptoms around the time her atorvastatin was switched to rosuvastatin. She denies any illness prior to worsening of her symptoms. She admits to weakness, such as issues getting up from a seated position.   She admits to father with RA. She denies any personal hx of autoimmune disease. She admits to hx of asthma since childhood, worse in her 30's, now well controlled.  She admits to father and grandmother w/ gout.  Activities of Daily Living:  Patient reports morning stiffness for 30 minutes.   Patient Reports nocturnal pain.  Difficulty dressing/grooming: Denies Difficulty climbing stairs: Reports Difficulty getting out of chair:  Reports Difficulty using hands for taps, buttons, cutlery, and/or writing: Reports  Review of Systems  Constitutional:  Positive for fatigue.  HENT:  Positive for mouth dryness. Negative for mouth sores.   Eyes:  Negative for dryness.  Respiratory:  Negative for shortness of breath.   Cardiovascular:  Negative for chest pain and palpitations.  Gastrointestinal:  Negative for blood in stool, constipation and diarrhea.  Endocrine: Negative for increased urination.  Genitourinary:  Negative for involuntary urination.  Musculoskeletal:  Positive for joint pain, gait problem, joint pain, joint swelling, myalgias, muscle weakness, morning stiffness, muscle tenderness and myalgias.  Skin:  Negative for color change, rash, hair loss and sensitivity to sunlight.  Allergic/Immunologic: Negative for susceptible to infections.  Neurological:  Positive for headaches. Negative for dizziness.  Hematological:  Negative for swollen glands.  Psychiatric/Behavioral:  Negative for depressed mood and sleep disturbance. The patient is nervous/anxious.      Rheum History: N/A   PMFS History:  Patient Active Problem List   Diagnosis Date Noted   Vascular complication 11/07/2022   MDD (major depressive disorder), recurrent episode, moderate (HCC) 07/30/2022   GAD (generalized anxiety disorder) 07/30/2022   PTSD (post-traumatic stress disorder) 07/30/2022   Insomnia 05/24/2022   Arthritis of first metatarsophalangeal (MTP) joint of right foot 04/25/2022   Intractable nausea and vomiting 11/16/2020   Ileus (HCC) 11/14/2020   Fecal impaction (HCC) 11/14/2020  Left-sided weakness 09/29/2020   Asthma 09/29/2020   Pre-diabetes 09/29/2020   Pain in left finger(s) 07/12/2020   Seizure-like activity (HCC) 03/25/2020   History of head injury 03/25/2020   Obstructive sleep apnea syndrome 09/04/2019   Acute medial meniscus tear of right knee 07/10/2019   Class 3 severe obesity due to excess calories with  serious comorbidity and body mass index (BMI) of 50.0 to 59.9 in adult 06/01/2019   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   Atrial fibrillation (HCC) 04/17/2019   History of pulmonary embolus (PE) 04/17/2019    Past Medical History:  Diagnosis Date   (HFpEF) heart failure with preserved ejection fraction (HCC) 01/2019   a.) Dx'd in [place]; b.) TTE 08/14/2022: EF 60-65%, mod LVH, mild LAE, triv MR   Anxiety    Asthma 05/69   Atrial fibrillation and flutter (HCC) 02/16/2019   a.) CHA2DS2VASc = 6 (sex, HFpEF, HTN, CVA x2, T2DM);  b.) s/p DCCV (200 J x 1) 08/22/2022; c.) s/p RF ablation (CTI) 11/02/2022; d.) rate/rhythm maintained on oral flecainide  + metoprolol  succinate; chronically anticoagulated with rivaroxaban    CHF (congestive heart failure) (HCC) 01/2019   Class 3 severe obesity due to excess calories with body mass index (BMI) of 50.0 to 59.9 in adult    DDD (degenerative disc disease), lumbar    GERD (gastroesophageal reflux disease)    Heart murmur 07/2017   Hiatal hernia    History of head injury    HTN (hypertension)    Hypercholesterolemia    Ileus (HCC)    Intractable nausea and vomiting 11/16/2020   Long term current use of anticoagulant    a.) rivaroxaban    Macular degeneration of left eye    MDD (major depressive disorder)    OA (osteoarthritis)    OSA on CPAP    Pneumonia    Presence of IVC filter    PTSD (post-traumatic stress disorder)    Recurrent pulmonary emboli (HCC)    a.) s/p IVC filter placement 2003; b.) previously followed by hematology when living in [place]   Seizure disorder (HCC)    a.) last was in 2020 per pt report   Stroke (HCC) 02/1995   1989 and 1995 (left sided weakness)   T2DM (type 2 diabetes mellitus) (HCC)    Vitamin D deficiency     Family History  Problem Relation Age of Onset   Breast cancer Mother    Heart attack Father    Diabetes Father    Congestive Heart Failure Father    Clotting disorder Father    Heart  disease Father    Heart failure Father    Hypertension Father    Heart failure Brother    Breast cancer Maternal Grandmother    Arrhythmia Paternal Grandmother    Past Surgical History:  Procedure Laterality Date   A-FLUTTER ABLATION N/A 11/02/2022   Procedure: A-FLUTTER ABLATION;  Surgeon: Mealor, Eulas BRAVO, MD;  Location: [facility];  Service: Cardiovascular;  Laterality: N/A;   ARTHRODESIS METATARSALPHALANGEAL JOINT (MTPJ) Left 02/25/2023   Procedure: ARTHRODESIS METATARSALPHALANGEAL JOINT (MTPJ);  Surgeon: Tobie Franky SQUIBB, DPM;  Location: [facility];  Service: Podiatry;  Laterality: Left;   CARDIAC CATHETERIZATION  2017   in [facility] in [place]   CARDIOVERSION N/A 08/22/2022   Procedure: CARDIOVERSION;  Surgeon: Sheena Pugh, DO;  Location: [facility];  Service: Cardiovascular;  Laterality: N/A;   CESAREAN SECTION N/A 1989   CESAREAN SECTION N/A 1993   CHOLECYSTECTOMY  2003  COLONOSCOPY WITH PROPOFOL  N/A 06/05/2019   Procedure: COLONOSCOPY WITH PROPOFOL ;  Surgeon: Rollin Dover, MD;  Location: [facility];  Service: Endoscopy;  Laterality: N/A;   CYST REMOVAL WITH BONE GRAFT Left 10/18/2020   Procedure: BONE GRAFTING OF ENCHONDROMA MIDDLE PHANLANX OF LEFT MIDDLE FINGER;  Surgeon: Murrell Kuba, MD;  Location: [facility];  Service: Orthopedics;  Laterality: Left;  AXILLARY BLOCK   DIAGNOSTIC LAPAROSCOPY  2015; 2017   lap hernia repair x2   ENDOVENOUS ABLATION SAPHENOUS VEIN W/ LASER Left 08/31/2021   endovenous laser ablation left greater saphenous vein and stab phlebectomy 10-20 incisions left leg by Penne Colorado MD   HALLUX FUSION Right 05/28/2022   Procedure: HALLUX FUSION METATARSAL PHALANGEAL JOIT;  Surgeon: Tobie Franky SQUIBB, DPM;  Location: [facility];  Service: Podiatry;  Laterality: Right;  BLOCK   HERNIA REPAIR  2014, 2018   umbilical hernia repair   IVC FILTER INSERTION  2003   POLYPECTOMY  06/05/2019   Procedure: POLYPECTOMY;  Surgeon:  Rollin Dover, MD;  Location: [facility];  Service: Endoscopy;;   SHOULDER ARTHROSCOPY W/ ROTATOR CUFF REPAIR Right 08/28/2017   TOTAL ABDOMINAL HYSTERECTOMY  2003   UPPER GI ENDOSCOPY     WRIST ARTHROSCOPY WITH DEBRIDEMENT Left 10/18/2020   Procedure: LEFT WRIST ARTHROSCOPY WITH DEBRIDEMENT;  Surgeon: Murrell Kuba, MD;  Location: [facility];  Service: Orthopedics;  Laterality: Left;  AXILLARY BLOCK   WRIST ARTHROSCOPY WITH DEBRIDEMENT Right 01/25/2022   Procedure: ARTHROSCOPY RIGHT WRIST WITH  DEBRIDEMENT/ SHRINKAGE;  Surgeon: Murrell Franky, MD;  Location: [facility];  Service: Orthopedics;  Laterality: Right;   Social History   Social History Narrative   ** Merged History Encounter **       Immunization History  Administered Date(s) Administered   INFLUENZA, HIGH DOSE SEASONAL PF 05/22/2019   Influenza Split 05/22/2019, 07/18/2021   Influenza, Seasonal, Injecte, Preservative Fre 06/19/2023   Influenza-Unspecified 05/28/2019   PFIZER(Purple Top)SARS-COV-2 Vaccination 12/18/2019, 01/18/2020     Objective: Vital Signs: BP 116/72 (BP Location: Right Arm, Patient Position: Sitting, Cuff Size: Small)   Pulse 67   Resp 12   Ht 5' 5 (1.651 m)   Wt (!) 312 lb 3.2 oz (141.6 kg)   BMI 51.95 kg/m    Physical Exam Vitals and nursing note reviewed.  Constitutional:      Appearance: She is well-developed.  HENT:     Head: Normocephalic and atraumatic.  Eyes:     Conjunctiva/sclera: Conjunctivae normal.  Cardiovascular:     Rate and Rhythm: Normal rate and regular rhythm.     Heart sounds: Murmur heard.  Pulmonary:     Effort: Pulmonary effort is normal.     Breath sounds: Normal breath sounds.  Abdominal:     General: Bowel sounds are normal.     Palpations: Abdomen is soft.  Musculoskeletal:     Cervical back: Normal range of motion.     Comments: Diffuse tenderness to palpation of large muscle groups b/l UE and LE  Skin:    General: Skin is warm and dry.     Capillary Refill:  Capillary refill takes less than 2 seconds.     Comments: Bilateral 1st toe w/ midline scar, no surrounding erythema or drainage  Neurological:     Mental Status: She is alert and oriented to person, place, and time.     Motor: No weakness (5/5 strenght b/l UE and LE; 4/5 grip strength left hand (chronic)).  Psychiatric:  Behavior: Behavior normal.      Musculoskeletal Exam:   CDAI Exam: CDAI Score: -- Patient Global: --; Provider Global: -- Swollen: 2 ; Tender: 17  Joint Exam 05/11/2024      Right  Left  Glenohumeral   Tender   Tender  Wrist   Tender   Tender  MTP 1  Swollen Tender   Tender  MTP 2   Tender   Tender  MTP 3   Tender   Tender  MTP 4   Tender   Tender  MTP 5   Tender   Tender  IP (toe)  Swollen Tender   Tender  PIP 2 (toe)   Tender        Investigation: No additional findings.  Imaging: DG ESOPHAGUS W DOUBLE CM (HD) Result Date: 04/13/2024 CLINICAL DATA:  Patient with history of GERD, esophageal dilation with complaint of dysphagia and globus sensation. EXAM: ESOPHAGUS/BARIUM SWALLOW/TABLET STUDY TECHNIQUE: Combined double and single contrast examination was performed using effervescent crystals, high-density barium, and thin liquid barium. This exam was performed by Laymon Coast, NP, and was supervised and interpreted by Dr. Jenna. FLUOROSCOPY: Radiation Exposure Index (as provided by the fluoroscopic device): 50.6 mGy Kerma COMPARISON:  None Available. FINDINGS: Swallowing: Appears normal. No vestibular penetration or aspiration seen. Pharynx: Unremarkable. Esophagus: Dilated appearance, especially the distal esophagus. Esophageal motility: Decreased peristalsis resulting in contrast stasis in the distal esophagus noted. Hiatal Hernia: None. Gastroesophageal reflux: Unable to fully clear contrast stasis from the distal esophagus prior to evaluating for reflux despite providing water. Contrast from the distal esophagus is seen refluxing to the mid  esophagus during provacative maneuvers. Ingested 13 mm barium tablet: Passed normally. Other: None. IMPRESSION: The distal esophagus appears dilated. Dysmotility is noted; decreased peristalsis is seen resulting in contrast stasis in the distal esophagus. Reflux is suspected but may be more closely related to contrast stasis. Otherwise normal esophagram. Performed by Laymon Coast, NP under the supervision of Dr. Jenna Electronically Signed   By: Cordella Jenna   On: 04/13/2024 10:51    Recent Labs: Lab Results  Component Value Date   WBC 4.5 03/16/2024   HGB 11.4 03/16/2024   PLT 241 03/16/2024   NA 141 03/16/2024   K 4.4 03/16/2024   CL 104 03/16/2024   CO2 24 03/16/2024   GLUCOSE 95 03/16/2024   BUN 14 03/16/2024   CREATININE 0.86 03/16/2024   BILITOT 0.3 03/16/2024   ALKPHOS 287 (H) 03/16/2024   AST 51 (H) 03/16/2024   ALT 76 (H) 03/16/2024   PROT 6.8 03/16/2024   ALBUMIN 4.0 03/16/2024   CALCIUM 10.3 (H) 03/16/2024   GFRAA >60 05/11/2020    Speciality Comments: No specialty comments available.  Procedures:  No procedures performed Allergies: Acetaminophen -codeine, Bee venom, Meloxicam, Tramadol, Codeine, Coconut (cocos nucifera), and Tomato   Assessment / Plan:     Visit Diagnoses:  #Myofascial pain syndrome #Fibromyalgia The patient notes constant fatigue and widespread pain and allodynia. The patient has a history of widespread pain lasting more than 3 months. his is consistent with a diagnosis of fibromyalgia.  Fibromyalgia is a chronic pain disorder, thought to be a disorder of pain regulation, and often classified as a form of central sensitization. Fibromyalgia is characterized by widespread musculoskeletal pain, accompanied by fatigue, cognitive disturbances, psychiatric symptoms, headaches, paresthesia's, and multiple somatic symptoms. Fibromyalgia is not an inflammatory or autoimmune condition, and immunosuppressive therapy does not treat fibromyalgia.  Treatment is both nonpharmacological and pharmacological in nature, and should  focus on sleep hygiene, graded exercise programs, pharmacologic therapy (options including TCA (amitriptyline), SNRIs (duloxetine, milnacipran), flexeril , gabapentin /lyrica , naltrexone, TENS), pyschosocial therapy i.e. CBT. The patient will likely benefit from multidisciplinary care by PCP, PT, integrative medicine, psychiatry, and pain management. These recommendations will be sent to patient's new PCP.   #Myalgias #Elevated liver enzymes #Taking a statin medication Patient with recent increase in muscle aches and subjective weakness, with new elevated liver enzymes on recent lab work. It is strange that all of this started after switching to Rosuvastatin. As discussed with patient, typically Rosuvastatin is less likely to cause myalgia/myopathy; however given that it was the only change prior to worsening symptoms and new elevated LFT's, would recommend switching back to Atorvastatin to see if this improves symptoms. Will reach out patient's PCP after she establishes care on Wednesday. Will also check CK, aldolase, and HMGCOA abs for full evaluation.   #Elevated ESR #Elevated CRP Patient with elevated ESR and CRP. As discussed with patient, an elevated erythrocyte sedimentation rate (ESR) is a nonspecific marker of inflammation. It can be elevated in a variety of conditions, including infections, inflammatory diseases such as rheumatoid arthritis and vasculitis, and malignancies. Non-rheumatic causes of elevated ESR include chronic kidney disease, anemia, and pregnancy. ESR can also be influenced by age and sex. It is important to interpret the ESR in the context of the patient's overall clinical picture and other laboratory findings.   An elevated C-reactive protein (CRP) level is a sensitive marker of inflammation and can be associated with acute and chronic inflammatory conditions, such as infections, autoimmune diseases,  and cardiovascular diseases. Non-rheumatic causes of elevated CRP include obesity, smoking, metabolic syndrome, and chronic kidney disease. CRP, like ESR, is not specific to any one condition and should be interpreted in conjunction with clinical findings.   At this time, unclear if elevated inflammatory markers are due to associated conditions additionally discussed or multifactorial in setting of co-morbidities.    #Bilateral foot pain #Bilateral ankle pain Patient with significant pain to palpation of bilateral ankles and feet, with evidence of swelling in right 1st toe. It is hard to determine if this is truly inflammatory given prior surgical intervention. Will obtain XR b/l feet and ankle for further evaluation, will consider MRI for a better evaluation of early inflammatory disease pending XR results.  #Hx of PE #Hx of Stroke Patient with hx of multiple blood clots and stroke in her 20's. She denies ever having hypercoagulable work up. Referral placed to hematology for further evaluation/management.   Orders: Orders Placed This Encounter  Procedures   DG Foot 2 Views Left   DG Foot 2 Views Right   DG Ankle 2 Views Left   DG Ankle 2 Views Right   CK   Aldolase   Anti-HMGCR Ab IgG   Ambulatory referral to Hematology / Oncology   No orders of the defined types were placed in this encounter.   Face-to-face time spent with patient was 60 minutes. Greater than 50% of time was spent in counseling and coordination of care.  Follow-Up Instructions: Return in about 2 months (around 07/11/2024).   Asberry Claw, DO

## 2024-05-11 ENCOUNTER — Ambulatory Visit

## 2024-05-11 ENCOUNTER — Ambulatory Visit (INDEPENDENT_AMBULATORY_CARE_PROVIDER_SITE_OTHER): Admitting: Licensed Clinical Social Worker

## 2024-05-11 VITALS — BP 116/72 | HR 67 | Resp 12 | Ht 65.0 in | Wt 312.2 lb

## 2024-05-11 DIAGNOSIS — F4310 Post-traumatic stress disorder, unspecified: Secondary | ICD-10-CM

## 2024-05-11 DIAGNOSIS — R700 Elevated erythrocyte sedimentation rate: Secondary | ICD-10-CM | POA: Diagnosis not present

## 2024-05-11 DIAGNOSIS — M79672 Pain in left foot: Secondary | ICD-10-CM

## 2024-05-11 DIAGNOSIS — M79671 Pain in right foot: Secondary | ICD-10-CM

## 2024-05-11 DIAGNOSIS — R7982 Elevated C-reactive protein (CRP): Secondary | ICD-10-CM | POA: Diagnosis not present

## 2024-05-11 DIAGNOSIS — M791 Myalgia, unspecified site: Secondary | ICD-10-CM | POA: Diagnosis not present

## 2024-05-11 DIAGNOSIS — F331 Major depressive disorder, recurrent, moderate: Secondary | ICD-10-CM

## 2024-05-11 DIAGNOSIS — D689 Coagulation defect, unspecified: Secondary | ICD-10-CM | POA: Diagnosis not present

## 2024-05-11 DIAGNOSIS — Z79899 Other long term (current) drug therapy: Secondary | ICD-10-CM

## 2024-05-11 DIAGNOSIS — F411 Generalized anxiety disorder: Secondary | ICD-10-CM | POA: Diagnosis not present

## 2024-05-11 NOTE — Progress Notes (Signed)
 Virtual Visit via Video Note   I connected with Meghan [name] on 05/11/24 at 3:00pm by video enabled telemedicine application and verified that I am speaking with the correct person using two identifiers.   I discussed the limitations, risks, security and privacy concerns of performing an evaluation and management service by video and the availability of in person appointments. I also discussed with the patient that there may be a patient responsible charge related to this service. The patient expressed understanding and agreed to proceed.   I discussed the assessment and treatment plan with the patient. The patient was provided an opportunity to ask questions and all were answered. The patient agreed with the plan and demonstrated an understanding of the instructions.   The patient was advised to call back or seek an in-person evaluation if the symptoms worsen or if the condition fails to improve as anticipated.   I provided 1 hour of non-face-to-face time during this encounter.   [name], LCSW, LCAS _______________________ THERAPIST PROGRESS NOTE   Session Time:  3:00pm - 4:00pm    Location: Patient: Patient home Provider: [facility]    Participation Level: Active    Behavioral Response: Alert, casually dressed, anxious mood/affect   Type of Therapy:  Individual Therapy   Treatment Goals addressed: Mood management; Medication management; Maintaining employment    Progress Towards Goals: Progressing   Interventions: CBT, problem solving, supportive therapy    Summary: Meghan [name] is a 58 year old divorced African American female that presented for therapy appointment today with diagnoses of Major Depressive Disorder, recurrent, moderate, Generalized Anxiety Disorder and PTSD.      Suicidal/Homicidal: None; without plan or intent                                                                                                                Therapist Response:  Clinician  met with Tae for virtual therapy session and assessed for safety, sobriety, and medication compliance.  Meghan [name] presented for appointment on time and was alert, oriented x5, with no evidence or self-report of active SI/HI or A/V H.  Yee reported ongoing compliance with medication and denied any use of alcohol or illicit substances.  Clinician inquired about Meghan [name]'s emotional ratings today, as well as any significant changes in thoughts, feelings, or behavior since last check-in.  Azalya reported scores of 0/10 for depression, 5/10 for anxiety, and 0/10 for anger/irritability.  Mikesha denied any recent panic attacks or outbursts.  Katanya reported that a recent success was having her grandson start talking to her again after recent outburst.  Falan reported that a recent struggle has been trying to decide what to do about her job, as they currently have her in a 'sub' position, and she needs money.  Clinician introduced topic of goal setting today.  Clinician utilized a handout on SMART goals to help guide discussion.  This handout explained how using this acronym can help break large goals down into smaller, realistic steps which are more achievable, and went  through each section (Specific, Measurable, Attainable, Relevant, and Time Bound) using open ended questions to guide discussion (i.e. What am I going to do?; How will I measure success?; What do I need to do to get started today?; etc).  Clinician tasked Meghan [name] with identifying primary goals to focus on in upcoming month, using this acronym to clarify steps she can begin taking now, and consider obstacles that may arise, along with helpful tools and resources that could be utilized to increase chance of successful completion.  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 She reported that she will spend 20 minutes each morning looking at other jobs, including those in areas she had not previously considered, such as retail with [employer].  Meghan [name] reported that she will also consider updating her resume to make her more competitive, and speak about her needs with her support network, as her daughter has been showing her job ads that might suit her skillset.  Meghan [name] stated "I'm trying to stay positive".  Clinician will continue to monitor.          Plan: Follow up again in 1-2 weeks.    Diagnosis: Major depressive disorder, recurrent, moderate; Generalized Anxiety Disorder; and PTSD    Collaboration of Care:   No collaboration of care required for this visit.                                                   Patient/Guardian was advised Release of Information must be obtained prior to any record release in order to collaborate their care with an outside provider. Patient/Guardian was advised if they have not already done so to contact the registration department to sign all necessary forms in order for us  to release information regarding their care.    Consent: Patient/Guardian gives verbal consent for treatment and assignment of benefits for services provided during this visit. Patient/Guardian expressed understanding and agreed to proceed.   [name], LCSW, LCAS 05/11/24

## 2024-05-13 ENCOUNTER — Other Ambulatory Visit (HOSPITAL_BASED_OUTPATIENT_CLINIC_OR_DEPARTMENT_OTHER): Payer: Self-pay

## 2024-05-13 DIAGNOSIS — Z87898 Personal history of other specified conditions: Secondary | ICD-10-CM

## 2024-05-15 LAB — HMGCR AB (IGG): HMGCR AB (IGG): <2 CU (ref <20)

## 2024-05-15 LAB — ALDOLASE: Aldolase: 3.8 U/L (ref <=8.1)

## 2024-05-15 LAB — CK: Total CK: 35 U/L (ref 21–240)

## 2024-05-18 ENCOUNTER — Ambulatory Visit (HOSPITAL_COMMUNITY): Admitting: Licensed Clinical Social Worker

## 2024-05-21 ENCOUNTER — Ambulatory Visit (HOSPITAL_COMMUNITY): Admitting: Licensed Clinical Social Worker

## 2024-05-21 DIAGNOSIS — F411 Generalized anxiety disorder: Secondary | ICD-10-CM | POA: Diagnosis not present

## 2024-05-21 DIAGNOSIS — F331 Major depressive disorder, recurrent, moderate: Secondary | ICD-10-CM

## 2024-05-21 DIAGNOSIS — F4310 Post-traumatic stress disorder, unspecified: Secondary | ICD-10-CM | POA: Diagnosis not present

## 2024-05-21 NOTE — Progress Notes (Signed)
 Virtual Visit via Video Note   I connected with Meghan [name] on 05/21/24 at 2:00pm by video enabled telemedicine application and verified that I am speaking with the correct person using two identifiers.   I discussed the limitations, risks, security and privacy concerns of performing an evaluation and management service by video and the availability of in person appointments. I also discussed with the patient that there may be a patient responsible charge related to this service. The patient expressed understanding and agreed to proceed.   I discussed the assessment and treatment plan with the patient. The patient was provided an opportunity to ask questions and all were answered. The patient agreed with the plan and demonstrated an understanding of the instructions.   The patient was advised to call back or seek an in-person evaluation if the symptoms worsen or if the condition fails to improve as anticipated.   I provided 40 minutes of non-face-to-face time during this encounter.   [name], LCSW, LCAS _______________________ THERAPIST PROGRESS NOTE   Session Time:  2:00pm - 2:40pm    Location: Patient: Patient home Provider: [facility]    Participation Level: Active    Behavioral Response: Alert, casually dressed, anxious mood/affect   Type of Therapy:  Individual Therapy   Treatment Goals addressed: Mood management; Medication management   Progress Towards Goals: Progressing   Interventions: CBT: challenging anxious thoughts    Summary: Meghan [name] is a 58 year old divorced African American female that presented for therapy appointment today with diagnoses of Major Depressive Disorder, recurrent, moderate, Generalized Anxiety Disorder and PTSD.      Suicidal/Homicidal: None; without plan or intent                                                                                                                Therapist Response:  Clinician met with Meghan [name] for virtual  therapy appointment and assessed for safety, sobriety, and medication compliance.  Meghan [name] presented for session on time and was alert, oriented x5, with no evidence or self-report of active SI/HI or A/V H.  Meghan [name] reported ongoing compliance with medication and denied any use of alcohol or illicit substances.  Clinician inquired about Meghan [name]'s current emotional ratings, as well as any significant changes in thoughts, feelings, or behavior since previous check-in.  Meghan [name] reported scores of 0/10 for depression, 5/10 for anxiety, and 0/10 for anger/irritability.  Meghan [name] denied any recent panic attacks or outbursts.  Meghan [name] reported that a recent success was having her grandson start back at school, which has given her a break each day during the week.  She reported that a struggle was getting an ear infection, which got progressively worse since Sunday.  Meghan [name] reported that she will be picking up antibiotics tomorrow to help with this issue.  Meghan [name] reported that an additional challenge has been feeling like all the men she meets are incompatible and she may never meet a good partner.  Clinician utilized handout in session today titled Worry exploration in order to  assist Meghan [name] in reducing her anxiety related to finding a compatible partner.  This worksheet featured a series of Socratic questions aimed at exploring the most likely outcomes for a situation of concern, rather than focusing on the worst possible outcome (i.e. catastrophizing).  Clinician assisted Meghan [name] in identifying and challenging any irrational beliefs related to this worry, in addition to utilizing problem solving approach to explore strategies which would help her accomplish goal of eventually meeting the right 'gentleman friend'.  Meghan [name] actively participated in discussion on handout, reporting that although the recent men she has met have had problems with substance abuse and infidelity, there is evidence to suggest she will be okay, as she doesn't mind  being alone, but is open to giving new people a chance to surprise her, as long as they align with her values, including a strong religious foundation and focus on family.  She reported that she will also rely on her support system to look for any red flags she doesn't initially notice.  Intervention was effective, as evidenced by Meghan [name] reporting that discussion on this subject reduced her anxiety about future dating opportunities and gave her hope that she might eventually find someone that is a better match that past men.  Clinician will continue to monitor.          Plan: Follow up again in 1-2 weeks.    Diagnosis: Major depressive disorder, recurrent, moderate; Generalized Anxiety Disorder; and PTSD    Collaboration of Care:   No collaboration of care required for this visit.                                                   Patient/Guardian was advised Release of Information must be obtained prior to any record release in order to collaborate their care with an outside provider. Patient/Guardian was advised if they have not already done so to contact the registration department to sign all necessary forms in order for us  to release information regarding their care.    Consent: Patient/Guardian gives verbal consent for treatment and assignment of benefits for services provided during this visit. Patient/Guardian expressed understanding and agreed to proceed.   [name], LCSW, LCAS 05/21/24

## 2024-05-25 ENCOUNTER — Ambulatory Visit (INDEPENDENT_AMBULATORY_CARE_PROVIDER_SITE_OTHER): Admitting: Licensed Clinical Social Worker

## 2024-05-25 DIAGNOSIS — F4310 Post-traumatic stress disorder, unspecified: Secondary | ICD-10-CM | POA: Diagnosis not present

## 2024-05-25 DIAGNOSIS — F331 Major depressive disorder, recurrent, moderate: Secondary | ICD-10-CM

## 2024-05-25 DIAGNOSIS — F411 Generalized anxiety disorder: Secondary | ICD-10-CM

## 2024-05-25 NOTE — Progress Notes (Signed)
 Virtual Visit via Video Note   I connected with Meghan [name] on 05/25/24 at 2:00pm by video enabled telemedicine application and verified that I am speaking with the correct person using two identifiers.   I discussed the limitations, risks, security and privacy concerns of performing an evaluation and management service by video and the availability of in person appointments. I also discussed with the patient that there may be a patient responsible charge related to this service. The patient expressed understanding and agreed to proceed.   I discussed the assessment and treatment plan with the patient. The patient was provided an opportunity to ask questions and all were answered. The patient agreed with the plan and demonstrated an understanding of the instructions.   The patient was advised to call back or seek an in-person evaluation if the symptoms worsen or if the condition fails to improve as anticipated.   I provided 50 minutes of non-face-to-face time during this encounter.   [name], LCSW, LCAS _______________________ THERAPIST PROGRESS NOTE   Session Time:  2:00pm - 2:50pm    Location: Patient: Patient home Provider: Home Office    Participation Level: Active    Behavioral Response: Alert, casually dressed, anxious mood/affect   Type of Therapy:  Individual Therapy   Treatment Goals addressed: Mood management; Medication management; Maintaining healthy boundaries with family   Progress Towards Goals: Progressing   Interventions: CBT, problem solving   Summary: Meghan [name] is a 58 year old divorced African American female that presented for therapy appointment today with diagnoses of Major Depressive Disorder, recurrent, moderate, Generalized Anxiety Disorder and PTSD.      Suicidal/Homicidal: None; without plan or intent                                                                                                                Therapist Response:  Clinician met  with Meghan [name] for virtual therapy session and assessed for safety, sobriety, and medication compliance.  Meghan [name] presented for appointment on time and was alert, oriented x5, with no evidence or self-report of active SI/HI or A/V H.  Meghan [name] reported ongoing compliance with medication and denied any use of alcohol or illicit substances.  Clinician inquired about Meghan [name]'s emotional ratings today, as well as any significant changes in thoughts, feelings, or behavior since last check-in.  Meghan [name] reported scores of 0/10 for depression, 5/10 for anxiety, and 0/10 for anger/irritability.  Meghan [name] denied any recent panic attacks or outbursts.  Meghan [name] reported that a recent struggle was having an outburst yesterday at her daughter and grandson, who have continued to share the household.  She stated I went off on them.  Clinician shared a CBT handout with Meghan [name] to aid in emotional processing and regulation as she continues managing day to day stressors in this shared environment.  This CBT handout illustrated how thoughts, emotions, and behaviors affect one another for better or worse depending upon the state of one's thinking.  Clinician explained how targeting irrational or maladaptive thoughts could lead to improvement  in mood and behavior as a result.  Clinician encouraged Meghan [name] to consider how this concept could be utilized to curb unhelpful fighting in the family until her daughter finds a new place.  Intervention was effective, as evidenced by Meghan [name] actively engaging in discussion on subject, and finding this handout to be helpful for building insight into recent triggers which have negatively impacted mood, and led to recurrent outbursts.  Meghan [name] reported that she has been trying to compartmentalize her feelings about how stressful this living situation has been, but she can no longer ignore it.  Meghan [name] reported that her grandson's disrespect, messiness, combined with her daughter's odd schedule, and nighttime routine has been  too much, and when the toilet flooded the other day it was "Too much to take":  Meghan [name] reported that they have an appointment with the pastor this week, which could offer a chance to open up more honestly, and also address problems with the grandson.  Clinician will continue to monitor.          Plan: Follow up again in 1-2 weeks.    Diagnosis: Major depressive disorder, recurrent, moderate; Generalized Anxiety Disorder; and PTSD    Collaboration of Care:   No collaboration of care required for this visit.                                                   Patient/Guardian was advised Release of Information must be obtained prior to any record release in order to collaborate their care with an outside provider. Patient/Guardian was advised if they have not already done so to contact the registration department to sign all necessary forms in order for us  to release information regarding their care.    Consent: Patient/Guardian gives verbal consent for treatment and assignment of benefits for services provided during this visit. Patient/Guardian expressed understanding and agreed to proceed.   [name], LCSW, LCAS 05/25/24

## 2024-05-27 ENCOUNTER — Other Ambulatory Visit: Payer: Self-pay

## 2024-05-27 ENCOUNTER — Emergency Department (HOSPITAL_COMMUNITY)
Admission: EM | Admit: 2024-05-27 | Discharge: 2024-05-28 | Disposition: A | Discharge status: Home / Self Care | Attending: Emergency Medicine | Admitting: Emergency Medicine

## 2024-05-27 ENCOUNTER — Encounter (HOSPITAL_COMMUNITY): Payer: Self-pay

## 2024-05-27 DIAGNOSIS — I110 Hypertensive heart disease with heart failure: Secondary | ICD-10-CM | POA: Insufficient documentation

## 2024-05-27 DIAGNOSIS — Z7901 Long term (current) use of anticoagulants: Secondary | ICD-10-CM | POA: Diagnosis not present

## 2024-05-27 DIAGNOSIS — Z8673 Personal history of transient ischemic attack (TIA), and cerebral infarction without residual deficits: Secondary | ICD-10-CM | POA: Diagnosis not present

## 2024-05-27 DIAGNOSIS — R569 Unspecified convulsions: Secondary | ICD-10-CM | POA: Insufficient documentation

## 2024-05-27 DIAGNOSIS — Z79899 Other long term (current) drug therapy: Secondary | ICD-10-CM | POA: Diagnosis not present

## 2024-05-27 DIAGNOSIS — J45909 Unspecified asthma, uncomplicated: Secondary | ICD-10-CM | POA: Diagnosis not present

## 2024-05-27 DIAGNOSIS — I509 Heart failure, unspecified: Secondary | ICD-10-CM | POA: Diagnosis not present

## 2024-05-27 LAB — CBC
HCT: 32.9 % — ABNORMAL LOW (ref 36.0–46.0)
Hemoglobin: 10.1 g/dL — ABNORMAL LOW (ref 12.0–15.0)
MCH: 26.4 pg (ref 26.0–34.0)
MCHC: 30.7 g/dL (ref 30.0–36.0)
MCV: 85.9 fL (ref 80.0–100.0)
Platelets: 247 K/uL (ref 150–400)
RBC: 3.83 MIL/uL — ABNORMAL LOW (ref 3.87–5.11)
RDW: 15.1 % (ref 11.5–15.5)
WBC: 5.1 K/uL (ref 4.0–10.5)
nRBC: 0 % (ref 0.0–0.2)
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
## 2024-05-27 NOTE — ED Triage Notes (Signed)
 Pt BIBA from home, c/o seizures. Pt was arguing with family and then went to room and had seizure.  When seen by fire she was post-ictal. Hx of seizures. Was taken off of keppra  a year ago, started having headaches over the past couple of months. Pt had flat affect on arrival. VSS. Has appointment with neurologist Friday.  LAC 20G CBG 117

## 2024-05-27 NOTE — ED Provider Notes (Signed)
 [place] EMERGENCY DEPARTMENT AT [facility] Provider Note   CSN: [id number] Arrival date & time: 05/27/24  2208     Patient presents with: Seizures   Meghan [name] is a 58 y.o. female with history of intractable nausea and vomiting, atrial fibrillation and flutter, anxiety, asthma, CHF, hypertension, seizures, CVA with left-sided deficits.  Patient presents to ED for evaluation of seizure.  Patient reports that she has not had a seizure since 2022.  Reports that she does follow with neurology but does not take medication for seizures, has not taken seizure medication for the last 1.5 years.  Reports that her seizures are typically preceded by stressful events.  Reports that this evening she was working with her grandson about not taking out the trash.  Reports that the stress throughout, showed to the bathroom and she believes that she had a seizure.  She reports that she was sitting on the edge of the bathtub and then she blacked out and does not remember waking up.  Her daughter at the bedside reports that the patient had a seizure.  She states that her mother had about 3 of these this evening.  States that they are separated by about 5 minutes.  Reports that after the last seizure, she had bitten her tongue and was postictal.  Patient unsure if she hit her head.  She states she has a headache and has been having headaches for the last few months which are intractable.  She reports that she is attempted to be seen by a new neurologist because of these headaches.  She reports that she is set to see neurology possibly on Friday for follow-up.  She was supposed to get an EEG but has not done this yet.     Seizures      Prior to Admission medications   Medication Sig Start Date End Date Taking? Authorizing Provider  levETIRAcetam  (KEPPRA ) 500 MG tablet Take 1 tablet (500 mg total) by mouth 2 (two) times daily. 05/28/24  Yes Ruthell Lonni FALCON, PA-C  acetaminophen  (TYLENOL ) 500 MG  tablet Take 2,000 mg by mouth every 6 (six) hours as needed.    [provider]  albuterol  (VENTOLIN  HFA) 108 (90 Base) MCG/ACT inhaler Inhale 2 puffs into the lungs every 6 (six) hours as needed for wheezing or shortness of breath.    [provider]  amitriptyline (ELAVIL) 10 MG tablet Take 10 mg by mouth at bedtime. 01/01/24   [provider]  bumetanide  (BUMEX ) 1 MG tablet Take 1 tablet (1 mg total) by mouth daily for 10 days. 12/25/22 05/11/24  Teresa Shelba SAUNDERS, NP  celecoxib  (CELEBREX ) 50 MG capsule Take 1 capsule (50 mg total) by mouth 2 (two) times daily. 05/06/24   Tobie Franky SQUIBB, DPM  Cholecalciferol (VITAMIN D-3) 125 MCG (5000 UT) TABS Take 5,000 Units by mouth daily.    [provider]  lidocaine  (LIDODERM ) 5 % Place 1 patch onto the skin daily. Remove & Discard patch within 12 hours or as directed by MD 02/19/24   Minnie Tinnie BRAVO, PA  MOUNJARO 7.5 MG/0.5ML Pen Inject 7.5 mg into the skin once a week. 09/04/23   [provider]  Omega-3 Fatty Acids (FISH OIL) 1200 MG CAPS Take 1,200 mg by mouth daily with breakfast.    [provider]  rivaroxaban  (XARELTO ) 20 MG TABS tablet Take 20 mg by mouth at bedtime.     [provider]  rizatriptan (MAXALT) 10 MG tablet Take  5 mg by mouth as needed. Patient not taking: Reported on 05/11/2024 04/02/24   [provider]  rosuvastatin (CRESTOR) 10 MG tablet Take 10 mg by mouth at bedtime. 07/10/23   [provider]  sertraline  (ZOLOFT ) 50 MG tablet Take 50 mg by mouth in the morning. 01/01/21   [provider]  tiZANidine  (ZANAFLEX ) 4 MG tablet Take 1 tablet (4 mg total) by mouth every 8 (eight) hours as needed for muscle spasms. 04/14/24   Banister, Pamela K, MD  topiramate (TOPAMAX) 50 MG tablet Take 50 mg by mouth daily. Patient not taking: Reported on 05/11/2024 04/02/24   [provider]  zolpidem  (AMBIEN  CR) 6.25 MG CR tablet Take 1 tablet (6.25 mg total) by  mouth at bedtime. 12/25/23   Neda Jennet LABOR, MD    Allergies: Acetaminophen -codeine, Bee venom, Meloxicam, Tramadol, Codeine, Coconut (cocos nucifera), and Tomato    Review of Systems  Neurological:  Positive for seizures.  All other systems reviewed and are negative.   Updated Vital Signs BP (!) 117/57   Pulse 61   Temp 98.3 F (36.8 C) (Oral)   Resp 15   Ht 5' 5 (1.651 m)   Wt (!) 141.6 kg   SpO2 94%   BMI 51.95 kg/m   Physical Exam Vitals and nursing note reviewed. Exam conducted with a chaperone present.  Constitutional:      General: She is not in acute distress.    Appearance: She is well-developed.  HENT:     Head: Normocephalic and atraumatic.  Eyes:     Conjunctiva/sclera: Conjunctivae normal.  Cardiovascular:     Rate and Rhythm: Normal rate and regular rhythm.     Heart sounds: No murmur heard. Pulmonary:     Effort: Pulmonary effort is normal. No respiratory distress.     Breath sounds: Normal breath sounds.  Abdominal:     Palpations: Abdomen is soft.     Tenderness: There is no abdominal tenderness.  Musculoskeletal:        General: No swelling.     Cervical back: Neck supple.  Skin:    General: Skin is warm and dry.     Capillary Refill: Capillary refill takes less than 2 seconds.  Neurological:     Mental Status: She is alert and oriented to person, place, and time. Mental status is at baseline.     Comments: CN III through XII intact.  Intact finger-nose, heel-to-shin.  Left-sided strength 4 out of 5, right-sided 5 out of 5.  Patient reports this is baseline.  Intact sensation.  PERRL.  Tracks cross midline.  Alert and oriented x 4.  Psychiatric:        Mood and Affect: Mood normal.     (all labs ordered are listed, but only abnormal results are displayed) Labs Reviewed  CBC - Abnormal; Notable for the following components:      Result Value   RBC 3.83 (*)    Hemoglobin 10.1 (*)    HCT 32.9 (*)    All other components within normal  limits  BASIC METABOLIC PANEL WITH GFR - Abnormal; Notable for the following components:   Glucose, Bld 103 (*)    All other components within normal limits  URINALYSIS, ROUTINE W REFLEX MICROSCOPIC    EKG: None  Radiology: CT Cervical Spine Wo Contrast Result Date: 05/28/2024 CLINICAL DATA:  Neck trauma, impaired ROM (Age 14-64y).  Seizure EXAM: CT CERVICAL SPINE WITHOUT CONTRAST TECHNIQUE: Multidetector CT imaging of the cervical spine  was performed without intravenous contrast. Multiplanar CT image reconstructions were also generated. RADIATION DOSE REDUCTION: This exam was performed according to the departmental dose-optimization program which includes automated exposure control, adjustment of the mA and/or kV according to patient size and/or use of iterative reconstruction technique. COMPARISON:  07/19/2021 FINDINGS: Alignment: Loss of cervical lordosis.  No subluxation. Skull base and vertebrae: No acute fracture. No primary bone lesion or focal pathologic process. Soft tissues and spinal canal: No prevertebral fluid or swelling. No visible canal hematoma. Disc levels: Mild degenerative disc disease diffusely with anterior spurring and disc space narrowing. No disc herniation. Upper chest: No acute findings Other: None IMPRESSION: Loss of cervical lordosis which may be positional or related to muscle spasm. No acute bony abnormality. Electronically Signed   By: Franky Crease M.D.   On: 05/28/2024 00:33   CT Head Wo Contrast Result Date: 05/28/2024 CLINICAL DATA:  Seizure.  Head trauma. EXAM: CT HEAD WITHOUT CONTRAST TECHNIQUE: Contiguous axial images were obtained from the base of the skull through the vertex without intravenous contrast. RADIATION DOSE REDUCTION: This exam was performed according to the departmental dose-optimization program which includes automated exposure control, adjustment of the mA and/or kV according to patient size and/or use of iterative reconstruction technique.  COMPARISON:  03/09/2024 FINDINGS: Brain: No acute intracranial abnormality. Specifically, no hemorrhage, hydrocephalus, mass lesion, acute infarction, or significant intracranial injury. Vascular: No hyperdense vessel or unexpected calcification. Skull: No acute calvarial abnormality. Sinuses/Orbits: No acute findings Other: None IMPRESSION: Normal study. Electronically Signed   By: Franky Crease M.D.   On: 05/28/2024 00:31    Procedures   Medications Ordered in the ED  metoCLOPramide  (REGLAN ) injection 10 mg (10 mg Intravenous Given 05/28/24 0132)  diphenhydrAMINE  (BENADRYL ) injection 25 mg (25 mg Intravenous Given 05/28/24 0132)       Medical Decision Making Amount and/or Complexity of Data Reviewed Labs: ordered. Radiology: ordered.   This is a 58 year old female with a history of seizures who presents to the ED after suffering from a seizure.  On exam, she is hemodynamically stable.  She is afebrile and nontachycardic.  Her lung sounds are clear bilaterally, she is not hypoxic.  Abdomen soft and compressible.  Neurological examination at baseline.  Overall nontoxic appearance with reassuring vital signs.  Patient reports that her daughter at the bedside did witness seizure tonight.  Patient daughter confirms this.  Patient was assessed utilizing CBC, BMP.  Patient CBC with baseline hemoglobin, no leukocytosis.  Metabolic panel grossly unremarkable.  CT head cervical spine unremarkable.  Patient was given Reglan , Benadryl  for headache which did resolve headache.  Patient was started back on Keppra  500 mg 2 times daily.  She reports that she is set to see her neurologist on Friday.  Advised her to follow-up with neurology on Friday.  She was given return precautions and she voiced understanding.  She had all of her questions answered to her satisfaction.  Stable to discharge.    Final diagnoses:  Seizure [facility])    ED Discharge Orders          Ordered    levETIRAcetam  (KEPPRA ) 500 MG  tablet  2 times daily        05/28/24 0134               Ruthell Lonni FALCON, PA-C 05/28/24 [id number]    Bari Charmaine FALCON, MD 05/30/24 [phone]

## 2024-05-28 ENCOUNTER — Other Ambulatory Visit (HOSPITAL_COMMUNITY)

## 2024-05-28 ENCOUNTER — Emergency Department (HOSPITAL_COMMUNITY)

## 2024-05-28 DIAGNOSIS — R569 Unspecified convulsions: Secondary | ICD-10-CM | POA: Diagnosis not present

## 2024-05-28 LAB — URINALYSIS, ROUTINE W REFLEX MICROSCOPIC
Bilirubin Urine: NEGATIVE
Glucose, UA: NEGATIVE mg/dL
Hgb urine dipstick: NEGATIVE
Ketones, ur: NEGATIVE mg/dL
Leukocytes,Ua: NEGATIVE
Nitrite: NEGATIVE
Protein, ur: NEGATIVE mg/dL
Specific Gravity, Urine: 1.017 (ref 1.005–1.030)
pH: 6 (ref 5.0–8.0)

## 2024-05-28 LAB — BASIC METABOLIC PANEL WITH GFR
Anion gap: 10 (ref 5–15)
BUN: 14 mg/dL (ref 6–20)
CO2: 26 mmol/L (ref 22–32)
Calcium: 10.1 mg/dL (ref 8.9–10.3)
Chloride: 106 mmol/L (ref 98–111)
Creatinine, Ser: 0.64 mg/dL (ref 0.44–1.00)
GFR, Estimated: >60 mL/min (ref >60)
Glucose, Bld: 103 mg/dL — ABNORMAL HIGH (ref 70–99)
Potassium: 3.6 mmol/L (ref 3.5–5.1)
Sodium: 142 mmol/L (ref 135–145)

## 2024-05-28 MED ORDER — METOCLOPRAMIDE HCL 5 MG/ML IJ SOLN
10.0000 mg | Freq: Once | INTRAMUSCULAR | Status: AC
  Administered 2024-05-28: 10 mg via INTRAVENOUS
  Filled 2024-05-28: qty 2

## 2024-05-28 MED ORDER — DIPHENHYDRAMINE HCL 50 MG/ML IJ SOLN
25.0000 mg | Freq: Once | INTRAMUSCULAR | Status: AC
  Administered 2024-05-28: 25 mg via INTRAVENOUS
  Filled 2024-05-28: qty 1

## 2024-05-28 MED ORDER — LEVETIRACETAM 500 MG PO TABS: 500.0000 mg | ORAL_TABLET | Freq: Two times a day (BID) | ORAL | 1 refills | Status: DC

## 2024-05-28 NOTE — Discharge Instructions (Signed)
 It was a pleasure taking part in your care.  As discussed, your workup appears reassuring.  I am starting you back on your seizure medication Keppra  500 mg which she will take twice daily.  Please follow-up with neurology on Friday as we discussed.  Please return to the ED with new symptoms.

## 2024-05-29 NOTE — Progress Notes (Deleted)
 Office Visit Note  Patient: Meghan [name]             Date of Birth: 1966/05/18           MRN: [id number]             PCP: Patient, No Pcp Per Referring: Elizbeth Leita Ruth, * Visit Date: 06/11/2024 Occupation: Data Unavailable  Subjective:  No chief complaint on file.   History of Present Illness: Meghan [name] is a 58 y.o. female ***     Activities of Daily Living:  Patient reports morning stiffness for *** {minute/hour:19697}.   Patient {ACTIONS;DENIES/REPORTS:21021675::Denies} nocturnal pain.  Difficulty dressing/grooming: {ACTIONS;DENIES/REPORTS:21021675::Denies} Difficulty climbing stairs: {ACTIONS;DENIES/REPORTS:21021675::Denies} Difficulty getting out of chair: {ACTIONS;DENIES/REPORTS:21021675::Denies} Difficulty using hands for taps, buttons, cutlery, and/or writing: {ACTIONS;DENIES/REPORTS:21021675::Denies}  No Rheumatology ROS completed.   PMFS History:  Patient Active Problem List   Diagnosis Date Noted   Vascular complication 11/07/2022   MDD (major depressive disorder), recurrent episode, moderate (HCC) 07/30/2022   GAD (generalized anxiety disorder) 07/30/2022   PTSD (post-traumatic stress disorder) 07/30/2022   Insomnia 05/24/2022   Arthritis of first metatarsophalangeal (MTP) joint of right foot 04/25/2022   Intractable nausea and vomiting 11/16/2020   Ileus (HCC) 11/14/2020   Fecal impaction (HCC) 11/14/2020   Left-sided weakness 09/29/2020   Asthma 09/29/2020   Pre-diabetes 09/29/2020   Pain in left finger(s) 07/12/2020   Seizure-like activity (HCC) 03/25/2020   History of head injury 03/25/2020   Obstructive sleep apnea syndrome 09/04/2019   Acute medial meniscus tear of right knee 07/10/2019   Class 3 severe obesity due to excess calories with serious comorbidity and body mass index (BMI) of 50.0 to 59.9 in adult 06/01/2019   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   Atrial fibrillation (HCC) 04/17/2019   History of pulmonary embolus  (PE) 04/17/2019    Past Medical History:  Diagnosis Date   (HFpEF) heart failure with preserved ejection fraction (HCC) 01/2019   a.) Dx'd in [place] , [place]; b.) TTE 08/14/2022: EF 60-65%, mod LVH, mild LAE, triv MR   Anxiety    Asthma 05/69   Atrial fibrillation and flutter (HCC) 02/16/2019   a.) CHA2DS2VASc = 6 (sex, HFpEF, HTN, CVA x2, T2DM);  b.) s/p DCCV (200 J x 1) 08/22/2022; c.) s/p RF ablation (CTI) 11/02/2022; d.) rate/rhythm maintained on oral flecainide  + metoprolol  succinate; chronically anticoagulated with rivaroxaban    CHF (congestive heart failure) (HCC) 01/2019   Class 3 severe obesity due to excess calories with body mass index (BMI) of 50.0 to 59.9 in adult    DDD (degenerative disc disease), lumbar    GERD (gastroesophageal reflux disease)    Heart murmur 07/2017   Hiatal hernia    History of head injury    HTN (hypertension)    Hypercholesterolemia    Ileus (HCC)    Intractable nausea and vomiting 11/16/2020   Long term current use of anticoagulant    a.) rivaroxaban    Macular degeneration of left eye    MDD (major depressive disorder)    OA (osteoarthritis)    OSA on CPAP    Pneumonia    Presence of IVC filter    PTSD (post-traumatic stress disorder)    Recurrent pulmonary emboli (HCC)    a.) s/p IVC filter placement 2003; b.) previously followed by hematology when living in [place]   Seizure disorder [facility])    a.) last was in 2020 per pt report   Stroke (HCC) 02/1995   1989 and 1995 (left sided weakness)  T2DM (type 2 diabetes mellitus) (HCC)    Vitamin D deficiency     Family History  Problem Relation Age of Onset   Breast cancer Mother    Heart attack Father    Diabetes Father    Congestive Heart Failure Father    Clotting disorder Father    Heart disease Father    Heart failure Father    Hypertension Father    Heart failure Brother    Breast cancer Maternal Grandmother    Arrhythmia Paternal Grandmother    Past Surgical History:   Procedure Laterality Date   A-FLUTTER ABLATION N/A 11/02/2022   Procedure: A-FLUTTER ABLATION;  Surgeon: Mealor, Eulas BRAVO, MD;  Location: [facility];  Service: Cardiovascular;  Laterality: N/A;   ARTHRODESIS METATARSALPHALANGEAL JOINT (MTPJ) Left 02/25/2023   Procedure: ARTHRODESIS METATARSALPHALANGEAL JOINT (MTPJ);  Surgeon: Tobie Franky SQUIBB, DPM;  Location: [facility] ORS;  Service: Podiatry;  Laterality: Left;   CARDIAC CATHETERIZATION  2017   in [facility] in [place]   CARDIOVERSION N/A 08/22/2022   Procedure: CARDIOVERSION;  Surgeon: Sheena Pugh, DO;  Location: [facility];  Service: Cardiovascular;  Laterality: N/A;   CESAREAN SECTION N/A 1989   CESAREAN SECTION N/A 1993   CHOLECYSTECTOMY  2003   COLONOSCOPY WITH PROPOFOL  N/A 06/05/2019   Procedure: COLONOSCOPY WITH PROPOFOL ;  Surgeon: Rollin Dover, MD;  Location: [facility];  Service: Endoscopy;  Laterality: N/A;   CYST REMOVAL WITH BONE GRAFT Left 10/18/2020   Procedure: BONE GRAFTING OF ENCHONDROMA MIDDLE PHANLANX OF LEFT MIDDLE FINGER;  Surgeon: Murrell Kuba, MD;  Location: [facility] OR;  Service: Orthopedics;  Laterality: Left;  AXILLARY BLOCK   DIAGNOSTIC LAPAROSCOPY  2015; 2017   lap hernia repair x2   ENDOVENOUS ABLATION SAPHENOUS VEIN W/ LASER Left 08/31/2021   endovenous laser ablation left greater saphenous vein and stab phlebectomy 10-20 incisions left leg by Penne Colorado MD   HALLUX FUSION Right 05/28/2022   Procedure: HALLUX FUSION METATARSAL PHALANGEAL JOIT;  Surgeon: Tobie Franky SQUIBB, DPM;  Location: [facility];  Service: Podiatry;  Laterality: Right;  BLOCK   HERNIA REPAIR  2014, 2018   umbilical hernia repair   IVC FILTER INSERTION  2003   POLYPECTOMY  06/05/2019   Procedure: POLYPECTOMY;  Surgeon: Rollin Dover, MD;  Location: [facility];  Service: Endoscopy;;   SHOULDER ARTHROSCOPY W/ ROTATOR CUFF REPAIR Right 08/28/2017   TOTAL ABDOMINAL HYSTERECTOMY  2003   UPPER GI ENDOSCOPY      WRIST ARTHROSCOPY WITH DEBRIDEMENT Left 10/18/2020   Procedure: LEFT WRIST ARTHROSCOPY WITH DEBRIDEMENT;  Surgeon: Murrell Kuba, MD;  Location: [facility] OR;  Service: Orthopedics;  Laterality: Left;  AXILLARY BLOCK   WRIST ARTHROSCOPY WITH DEBRIDEMENT Right 01/25/2022   Procedure: ARTHROSCOPY RIGHT WRIST WITH  DEBRIDEMENT/ SHRINKAGE;  Surgeon: Murrell Franky, MD;  Location: [facility] OR;  Service: Orthopedics;  Laterality: Right;   Social History   Tobacco Use   Smoking status: Never    Passive exposure: Never   Smokeless tobacco: Never  Vaping Use   Vaping status: Never Used  Substance Use Topics   Alcohol use: Never   Drug use: Never   Social History   Social History Narrative   ** Merged History Encounter **         Immunization History  Administered Date(s) Administered   INFLUENZA, HIGH DOSE SEASONAL PF 05/22/2019   Influenza Split 05/22/2019, 07/18/2021   Influenza, Seasonal, Injecte, Preservative Fre 06/19/2023   Influenza-Unspecified 05/28/2019   PFIZER(Purple Top)SARS-COV-2 Vaccination  12/18/2019, 01/18/2020     Objective: Vital Signs: There were no vitals taken for this visit.   Physical Exam   Musculoskeletal Exam: ***  CDAI Exam: CDAI Score: -- Patient Global: --; Provider Global: -- Swollen: --; Tender: -- Joint Exam 06/11/2024   No joint exam has been documented for this visit   There is currently no information documented on the homunculus. Go to the Rheumatology activity and complete the homunculus joint exam.  Investigation: No additional findings.  Imaging: CT Cervical Spine Wo Contrast Result Date: 05/28/2024 CLINICAL DATA:  Neck trauma, impaired ROM (Age 60-64y).  Seizure EXAM: CT CERVICAL SPINE WITHOUT CONTRAST TECHNIQUE: Multidetector CT imaging of the cervical spine was performed without intravenous contrast. Multiplanar CT image reconstructions were also generated. RADIATION DOSE REDUCTION: This exam was performed according to the departmental  dose-optimization program which includes automated exposure control, adjustment of the mA and/or kV according to patient size and/or use of iterative reconstruction technique. COMPARISON:  07/19/2021 FINDINGS: Alignment: Loss of cervical lordosis.  No subluxation. Skull base and vertebrae: No acute fracture. No primary bone lesion or focal pathologic process. Soft tissues and spinal canal: No prevertebral fluid or swelling. No visible canal hematoma. Disc levels: Mild degenerative disc disease diffusely with anterior spurring and disc space narrowing. No disc herniation. Upper chest: No acute findings Other: None IMPRESSION: Loss of cervical lordosis which may be positional or related to muscle spasm. No acute bony abnormality. Electronically Signed   By: Franky Crease M.D.   On: 05/28/2024 00:33   CT Head Wo Contrast Result Date: 05/28/2024 CLINICAL DATA:  Seizure.  Head trauma. EXAM: CT HEAD WITHOUT CONTRAST TECHNIQUE: Contiguous axial images were obtained from the base of the skull through the vertex without intravenous contrast. RADIATION DOSE REDUCTION: This exam was performed according to the departmental dose-optimization program which includes automated exposure control, adjustment of the mA and/or kV according to patient size and/or use of iterative reconstruction technique. COMPARISON:  03/09/2024 FINDINGS: Brain: No acute intracranial abnormality. Specifically, no hemorrhage, hydrocephalus, mass lesion, acute infarction, or significant intracranial injury. Vascular: No hyperdense vessel or unexpected calcification. Skull: No acute calvarial abnormality. Sinuses/Orbits: No acute findings Other: None IMPRESSION: Normal study. Electronically Signed   By: Franky Crease M.D.   On: 05/28/2024 00:31    Recent Labs: Lab Results  Component Value Date   WBC 5.1 05/27/2024   HGB 10.1 (L) 05/27/2024   PLT 247 05/27/2024   NA 142 05/27/2024   K 3.6 05/27/2024   CL 106 05/27/2024   CO2 26 05/27/2024    GLUCOSE 103 (H) 05/27/2024   BUN 14 05/27/2024   CREATININE 0.64 05/27/2024   BILITOT 0.3 03/16/2024   ALKPHOS 287 (H) 03/16/2024   AST 51 (H) 03/16/2024   ALT 76 (H) 03/16/2024   PROT 6.8 03/16/2024   ALBUMIN 4.0 03/16/2024   CALCIUM 10.1 05/27/2024   GFRAA >60 05/11/2020    Speciality Comments: No specialty comments available.  Procedures:  No procedures performed Allergies: Acetaminophen -codeine, Bee venom, Meloxicam, Tramadol, Codeine, Coconut (cocos nucifera), and Tomato   Assessment / Plan:     Visit Diagnoses: No diagnosis found.  Orders: No orders of the defined types were placed in this encounter.  No orders of the defined types were placed in this encounter.   Face-to-face time spent with patient was *** minutes. Greater than 50% of time was spent in counseling and coordination of care.  Follow-Up Instructions: No follow-ups on file.   Alfonso Patterson, LPN  Note - This record has been created using [employer].  Chart creation errors have been sought, but may not always  have been located. Such creation errors do not reflect on  the standard of medical care.

## 2024-06-01 ENCOUNTER — Ambulatory Visit (HOSPITAL_COMMUNITY): Admitting: Licensed Clinical Social Worker

## 2024-06-01 DIAGNOSIS — F4310 Post-traumatic stress disorder, unspecified: Secondary | ICD-10-CM | POA: Diagnosis not present

## 2024-06-01 DIAGNOSIS — F331 Major depressive disorder, recurrent, moderate: Secondary | ICD-10-CM | POA: Diagnosis not present

## 2024-06-01 DIAGNOSIS — F411 Generalized anxiety disorder: Secondary | ICD-10-CM
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
## 2024-06-06 ENCOUNTER — Ambulatory Visit (HOSPITAL_BASED_OUTPATIENT_CLINIC_OR_DEPARTMENT_OTHER)
Admission: RE | Admit: 2024-06-06 | Discharge: 2024-06-06 | Disposition: A | Discharge status: Home / Self Care | Source: Ambulatory Visit

## 2024-06-06 DIAGNOSIS — Z87898 Personal history of other specified conditions: Secondary | ICD-10-CM

## 2024-06-06 DIAGNOSIS — R519 Headache, unspecified: Secondary | ICD-10-CM | POA: Diagnosis present

## 2024-06-06 MED ORDER — GADOBUTROL 1 MMOL/ML IV SOLN
10.0000 mL | Freq: Once | INTRAVENOUS | Status: AC | PRN
  Administered 2024-06-06: 10 mL via INTRAVENOUS
  Filled 2024-06-06: qty 10

## 2024-06-08 ENCOUNTER — Ambulatory Visit (INDEPENDENT_AMBULATORY_CARE_PROVIDER_SITE_OTHER): Admitting: Licensed Clinical Social Worker

## 2024-06-08 DIAGNOSIS — F331 Major depressive disorder, recurrent, moderate: Secondary | ICD-10-CM | POA: Diagnosis not present

## 2024-06-08 DIAGNOSIS — F411 Generalized anxiety disorder: Secondary | ICD-10-CM

## 2024-06-08 DIAGNOSIS — F4310 Post-traumatic stress disorder, unspecified: Secondary | ICD-10-CM

## 2024-06-08 NOTE — Progress Notes (Signed)
 Virtual Visit via Video Note   I connected with Afrika S. Pirozzi on 06/08/24 at 2:00pm by video enabled telemedicine application and verified that I am speaking with the correct person using two identifiers.   I discussed the limitations, risks, security and privacy concerns of performing an evaluation and management service by video and the availability of in person appointments. I also discussed with the patient that there may be a patient responsible charge related to this service. The patient expressed understanding and agreed to proceed.   I discussed the assessment and treatment plan with the patient. The patient was provided an opportunity to ask questions and all were answered. The patient agreed with the plan and demonstrated an understanding of the instructions.   The patient was advised to call back or seek an in-person evaluation if the symptoms worsen or if the condition fails to improve as anticipated.   I provided 35 minutes of non-face-to-face time during this encounter.   [name], LCSW, LCAS _______________________ THERAPIST PROGRESS NOTE   Session Time:  2:00pm - 2:35pm    Location: Patient: Patient home Provider: [facility]    Participation Level: Active    Behavioral Response: Alert, casually dressed, anxious mood/affect   Type of Therapy:  Individual Therapy   Treatment Goals addressed: Mood management; Medication management; Maintaining healthy boundaries with family   Progress Towards Goals: Progressing   Interventions: CBT, problem solving   Summary: Joscelynn S. Matarazzo is a 58 year old divorced African American female that presented for therapy appointment today with diagnoses of Major Depressive Disorder, recurrent, moderate, Generalized Anxiety Disorder and PTSD.      Suicidal/Homicidal: None; without plan or intent                                                                                                                Therapist Response:  Clinician  met with Cleone for virtual therapy session and assessed for safety, sobriety, and medication compliance.  Colinda presented for appointment on time and was alert, oriented x5, with no evidence or self-report of active SI/HI or A/V H.  [name] reported ongoing compliance with medication and denied any use of alcohol or illicit substances.  Clinician inquired about Alexah's emotional ratings today, as well as any significant changes in thoughts, feelings, or behavior since last check-in.  Gudelia reported scores of 0/10 for depression, 6/10 for anxiety, and 0/10 for anger/irritability.  Helma denied any recent panic attacks or outbursts.  Vinie reported that a recent success was getting confirmation that her daughter and grandson will be moving out at the end of this week.  Nikoleta reported that while her daughter has been gone to [place] picking up some belongings, things have been very calm in the house with the grandson, which is making her think that the daughter is their mutual trigger.  She stated I'm anxious about her coming back, and things getting stressful again.  Clinician revisited a CBT handout with Toniesha to aid in emotional processing and regulation as she  continues managing day to day stressors while sharing a household with her daughter and grandson.  This CBT handout illustrated how thoughts, emotions, and behaviors affect one another for better or worse depending upon the state of one's thinking. Clinician explained how targeting irrational or maladaptive thoughts could lead to improvement in mood and behavior as a result. Clinician encouraged Floree to consider how this concept could be utilized to reduce overall conflict with family until Friday when the daughter and grandson move into their own place.  Intervention was effective, as evidenced by [name] actively engaging in discussion on subject, and reporting that talking about recent interactions with her daughter and grandson have helped her realize that her  daughter's negative attitude has been triggering to both her, and the grandson, which likely fueled recent aggression from the grandson that alarmed Lempi and made her feel less safe.  Onita reported that her neighbor and sister have become aware of this difficult relationship as well, and have planned to speak with her about these concerns.  Bernisha reported that she will try to mediate between them over the next few days, and suggest that both of them find therapists since she will not be as present to help during crises.  Rosita stated I'm glad they will have their own place, but will need help.  Clinician will continue to monitor.          Plan: Follow up again in 1-2 weeks.    Diagnosis: Major depressive disorder, recurrent, moderate; Generalized Anxiety Disorder; and PTSD    Collaboration of Care:   No collaboration of care required for this visit.                                                   Patient/Guardian was advised Release of Information must be obtained prior to any record release in order to collaborate their care with an outside provider. Patient/Guardian was advised if they have not already done so to contact the registration department to sign all necessary forms in order for us  to release information regarding their care.    Consent: Patient/Guardian gives verbal consent for treatment and assignment of benefits for services provided during this visit. Patient/Guardian expressed understanding and agreed to proceed.   [name], LCSW, LCAS 06/08/24

## 2024-06-09 ENCOUNTER — Ambulatory Visit: Admitting: Internal Medicine

## 2024-06-11 ENCOUNTER — Inpatient Hospital Stay: Admitting: Oncology

## 2024-06-11 ENCOUNTER — Inpatient Hospital Stay

## 2024-06-11 ENCOUNTER — Telehealth: Payer: Self-pay

## 2024-06-11 ENCOUNTER — Ambulatory Visit

## 2024-06-11 DIAGNOSIS — M79671 Pain in right foot: Secondary | ICD-10-CM

## 2024-06-11 DIAGNOSIS — R7982 Elevated C-reactive protein (CRP): Secondary | ICD-10-CM

## 2024-06-11 DIAGNOSIS — R700 Elevated erythrocyte sedimentation rate: Secondary | ICD-10-CM

## 2024-06-11 DIAGNOSIS — M791 Myalgia, unspecified site: Secondary | ICD-10-CM

## 2024-06-11 NOTE — Telephone Encounter (Signed)
 Left a voicemail with patient regarding her missed appt with Dr. Autumn today. Informed that a scheduler will reach out to reschedule missed appointment.

## 2024-06-15 ENCOUNTER — Ambulatory Visit (INDEPENDENT_AMBULATORY_CARE_PROVIDER_SITE_OTHER): Admitting: Licensed Clinical Social Worker

## 2024-06-15 DIAGNOSIS — F411 Generalized anxiety disorder: Secondary | ICD-10-CM

## 2024-06-15 DIAGNOSIS — F4310 Post-traumatic stress disorder, unspecified: Secondary | ICD-10-CM

## 2024-06-15 DIAGNOSIS — F331 Major depressive disorder, recurrent, moderate: Secondary | ICD-10-CM | POA: Diagnosis not present

## 2024-06-15 NOTE — Progress Notes (Signed)
 Virtual Visit via Video Note   I connected with Threasa S. Hindley on 06/15/24 at 2:00pm by video enabled telemedicine application and verified that I am speaking with the correct person using two identifiers.   I discussed the limitations, risks, security and privacy concerns of performing an evaluation and management service by video and the availability of in person appointments. I also discussed with the patient that there may be a patient responsible charge related to this service. The patient expressed understanding and agreed to proceed.   I discussed the assessment and treatment plan with the patient. The patient was provided an opportunity to ask questions and all were answered. The patient agreed with the plan and demonstrated an understanding of the instructions.   The patient was advised to call back or seek an in-person evaluation if the symptoms worsen or if the condition fails to improve as anticipated.   I provided 34 minutes of non-face-to-face time during this encounter.   [name], LCSW, LCAS _______________________ THERAPIST PROGRESS NOTE   Session Time:  2:00pm - 2:34pm    Location: Patient: Patient home Provider: Home Office   Participation Level: Active    Behavioral Response: Alert, casually dressed, angry/irritable mood/affect   Type of Therapy:  Individual Therapy   Treatment Goals addressed: Mood management; Medication management; Maintaining healthy boundaries with family   Progress Towards Goals: Progressing   Interventions: CBT, psychoeducation on healthy boundaries    Summary: Analina S. Deprey is a 58 year old divorced African American female that presented for therapy appointment today with diagnoses of Major Depressive Disorder, recurrent, moderate, Generalized Anxiety Disorder and PTSD.      Suicidal/Homicidal: None; without plan or intent                                                                                                                 Therapist Response:  Clinician met with Marybeth for virtual therapy appointment and assessed for safety, sobriety, and medication compliance.  Takisha presented for session on time and was alert, oriented x5, with no evidence or self-report of active SI/HI or A/V H.  [name] reported ongoing compliance with medication and denied any use of alcohol or illicit substances.  Clinician inquired about Colette's current emotional ratings, as well as any significant changes in thoughts, feelings, or behavior since previous check-in.  Kimarie reported scores of 0/10 for depression, 0/10 for anxiety, and 10/10 for anger/irritability.  Amorie denied any recent panic attacks or outbursts.  Nathaly reported that a recent struggle has been feeling frustrating by her daughter and grandson's lack of respect.  She reported that when she came home each night over the weekend, they left messes behind in the sink, laundry, and other spaces, in addition to eating her food.  Clinician reviewed psychoeducation on subject of boundaries with Chelsy today.  This included defining boundaries as the limits and rules that we set for ourselves within relationships, and featured a breakdown of the 3 common categories of boundaries (i.e. porous, rigid, and healthy), along  with typical traits specific to each one for easy identification.  It was noted that most people have a mixture of different boundary types depending on setting, person, and culture.  Clinician tasked [name] with identifying which types of boundaries she presently has within her own support system, the collective impact these boundaries have upon her mental health, and changes that could be made in order to more effectively communicate her mental health needs.  Intervention was effective, as evidenced by [name] actively engaging in discussion on topic, and reporting that she needs to set more rigid boundaries with her daughter and grandson due to disrespect for her boundaries being seen.   Zyra reported that she has been very passive with them, but recognized that this could eventually lead to an outburst, so she will make it clear when they come home today that until they move later this week, they will no longer be able to eat her food, or leave messes behind, or they will be expected to move out sooner than planned.  Donyale reported that she will use coping skills learned from therapy to remain calm during these interactions, including deep breathing.  Haylo stated "I have enough on my plate with work, church and doctor's appointments.  She has to understand that there are boundaries to follow".  Clinician will continue to monitor.          Plan: Follow up again in 1-2 weeks.    Diagnosis: Major depressive disorder, recurrent, moderate; Generalized Anxiety Disorder; and PTSD    Collaboration of Care:   No collaboration of care required for this visit.                                                   Patient/Guardian was advised Release of Information must be obtained prior to any record release in order to collaborate their care with an outside provider. Patient/Guardian was advised if they have not already done so to contact the registration department to sign all necessary forms in order for us  to release information regarding their care.    Consent: Patient/Guardian gives verbal consent for treatment and assignment of benefits for services provided during this visit. Patient/Guardian expressed understanding and agreed to proceed.   [name], LCSW, LCAS 06/15/24

## 2024-06-17 ENCOUNTER — Ambulatory Visit: Admitting: Family

## 2024-06-18 ENCOUNTER — Ambulatory Visit: Payer: Self-pay

## 2024-06-22 ENCOUNTER — Ambulatory Visit (INDEPENDENT_AMBULATORY_CARE_PROVIDER_SITE_OTHER): Admitting: Licensed Clinical Social Worker

## 2024-06-22 DIAGNOSIS — F411 Generalized anxiety disorder: Secondary | ICD-10-CM | POA: Diagnosis not present

## 2024-06-22 DIAGNOSIS — F331 Major depressive disorder, recurrent, moderate: Secondary | ICD-10-CM

## 2024-06-22 DIAGNOSIS — F4310 Post-traumatic stress disorder, unspecified: Secondary | ICD-10-CM | POA: Diagnosis not present
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
## 2024-06-30 NOTE — Progress Notes (Deleted)
 Office Visit Note  Patient: Meghan [name]             Date of Birth: 10/26/1965           MRN: [id number]             PCP: [facility] Referring: Elizbeth Leita Ruth, * Visit Date: 07/13/2024 Occupation: Data Unavailable  Subjective:  No chief complaint on file.   History of Present Illness: Meghan [name] is a 58 y.o. female ***     Activities of Daily Living:  Patient reports morning stiffness for *** {minute/hour:19697}.   Patient {ACTIONS;DENIES/REPORTS:21021675::Denies} nocturnal pain.  Difficulty dressing/grooming: {ACTIONS;DENIES/REPORTS:21021675::Denies} Difficulty climbing stairs: {ACTIONS;DENIES/REPORTS:21021675::Denies} Difficulty getting out of chair: {ACTIONS;DENIES/REPORTS:21021675::Denies} Difficulty using hands for taps, buttons, cutlery, and/or writing: {ACTIONS;DENIES/REPORTS:21021675::Denies}  No Rheumatology ROS completed.   PMFS History:  Patient Active Problem List   Diagnosis Date Noted   Vascular complication 11/07/2022   MDD (major depressive disorder), recurrent episode, moderate (HCC) 07/30/2022   GAD (generalized anxiety disorder) 07/30/2022   PTSD (post-traumatic stress disorder) 07/30/2022   Insomnia 05/24/2022   Arthritis of first metatarsophalangeal (MTP) joint of right foot 04/25/2022   Intractable nausea and vomiting 11/16/2020   Ileus (HCC) 11/14/2020   Fecal impaction (HCC) 11/14/2020   Left-sided weakness 09/29/2020   Asthma 09/29/2020   Pre-diabetes 09/29/2020   Pain in left finger(s) 07/12/2020   Seizure-like activity (HCC) 03/25/2020   History of head injury 03/25/2020   Obstructive sleep apnea syndrome 09/04/2019   Acute medial meniscus tear of right knee 07/10/2019   Class 3 severe obesity due to excess calories with serious comorbidity and body mass index (BMI) of 50.0 to 59.9 in adult (HCC) 06/01/2019   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   Atrial fibrillation (HCC) 04/17/2019   History of pulmonary  embolus (PE) 04/17/2019    Past Medical History:  Diagnosis Date   (HFpEF) heart failure with preserved ejection fraction (HCC) 01/2019   a.) Dx'd in [place] , [place]; b.) TTE 08/14/2022: EF 60-65%, mod LVH, mild LAE, triv MR   Anxiety    Asthma 05/69   Atrial fibrillation and flutter (HCC) 02/16/2019   a.) CHA2DS2VASc = 6 (sex, HFpEF, HTN, CVA x2, T2DM);  b.) s/p DCCV (200 J x 1) 08/22/2022; c.) s/p RF ablation (CTI) 11/02/2022; d.) rate/rhythm maintained on oral flecainide  + metoprolol  succinate; chronically anticoagulated with rivaroxaban    CHF (congestive heart failure) (HCC) 01/2019   Class 3 severe obesity due to excess calories with body mass index (BMI) of 50.0 to 59.9 in adult    DDD (degenerative disc disease), lumbar    GERD (gastroesophageal reflux disease)    Heart murmur 07/2017   Hiatal hernia    History of head injury    HTN (hypertension)    Hypercholesterolemia    Ileus (HCC)    Intractable nausea and vomiting 11/16/2020   Long term current use of anticoagulant    a.) rivaroxaban    Macular degeneration of left eye    MDD (major depressive disorder)    OA (osteoarthritis)    OSA on CPAP    Pneumonia    Presence of IVC filter    PTSD (post-traumatic stress disorder)    Recurrent pulmonary emboli (HCC)    a.) s/p IVC filter placement 2003; b.) previously followed by hematology when living in [place] , [place]   Seizure disorder (HCC)    a.) last was in 2020 per pt report   Stroke (HCC) 02/1995   1989 and 1995 (left sided weakness)  T2DM (type 2 diabetes mellitus) (HCC)    Vitamin D deficiency     Family History  Problem Relation Age of Onset   Breast cancer Mother    Heart attack Father    Diabetes Father    Congestive Heart Failure Father    Clotting disorder Father    Heart disease Father    Heart failure Father    Hypertension Father    Heart failure Brother    Breast cancer Maternal Grandmother    Arrhythmia Paternal Grandmother    Past Surgical  History:  Procedure Laterality Date   A-FLUTTER ABLATION N/A 11/02/2022   Procedure: A-FLUTTER ABLATION;  Surgeon: Mealor, Eulas BRAVO, MD;  Location: [facility];  Service: Cardiovascular;  Laterality: N/A;   ARTHRODESIS METATARSALPHALANGEAL JOINT (MTPJ) Left 02/25/2023   Procedure: ARTHRODESIS METATARSALPHALANGEAL JOINT (MTPJ);  Surgeon: Tobie Franky SQUIBB, DPM;  Location: [facility];  Service: Podiatry;  Laterality: Left;   CARDIAC CATHETERIZATION  2017   in [facility] in [place]   CARDIOVERSION N/A 08/22/2022   Procedure: CARDIOVERSION;  Surgeon: Sheena Pugh, DO;  Location: [facility];  Service: Cardiovascular;  Laterality: N/A;   CESAREAN SECTION N/A 1989   CESAREAN SECTION N/A 1993   CHOLECYSTECTOMY  2003   COLONOSCOPY WITH PROPOFOL  N/A 06/05/2019   Procedure: COLONOSCOPY WITH PROPOFOL ;  Surgeon: Rollin Dover, MD;  Location: [facility];  Service: Endoscopy;  Laterality: N/A;   CYST REMOVAL WITH BONE GRAFT Left 10/18/2020   Procedure: BONE GRAFTING OF ENCHONDROMA MIDDLE PHANLANX OF LEFT MIDDLE FINGER;  Surgeon: Murrell Kuba, MD;  Location: [facility];  Service: Orthopedics;  Laterality: Left;  AXILLARY BLOCK   DIAGNOSTIC LAPAROSCOPY  2015; 2017   lap hernia repair x2   ENDOVENOUS ABLATION SAPHENOUS VEIN W/ LASER Left 08/31/2021   endovenous laser ablation left greater saphenous vein and stab phlebectomy 10-20 incisions left leg by Penne Colorado MD   HALLUX FUSION Right 05/28/2022   Procedure: HALLUX FUSION METATARSAL PHALANGEAL JOIT;  Surgeon: Tobie Franky SQUIBB, DPM;  Location: [facility];  Service: Podiatry;  Laterality: Right;  BLOCK   HERNIA REPAIR  2014, 2018   umbilical hernia repair   IVC FILTER INSERTION  2003   POLYPECTOMY  06/05/2019   Procedure: POLYPECTOMY;  Surgeon: Rollin Dover, MD;  Location: [facility];  Service: Endoscopy;;   SHOULDER ARTHROSCOPY W/ ROTATOR CUFF REPAIR Right 08/28/2017   TOTAL ABDOMINAL HYSTERECTOMY  2003   UPPER GI  ENDOSCOPY     WRIST ARTHROSCOPY WITH DEBRIDEMENT Left 10/18/2020   Procedure: LEFT WRIST ARTHROSCOPY WITH DEBRIDEMENT;  Surgeon: Murrell Kuba, MD;  Location: [facility];  Service: Orthopedics;  Laterality: Left;  AXILLARY BLOCK   WRIST ARTHROSCOPY WITH DEBRIDEMENT Right 01/25/2022   Procedure: ARTHROSCOPY RIGHT WRIST WITH  DEBRIDEMENT/ SHRINKAGE;  Surgeon: Murrell Franky, MD;  Location: [facility];  Service: Orthopedics;  Laterality: Right;   Social History   Tobacco Use   Smoking status: Never    Passive exposure: Never   Smokeless tobacco: Never  Vaping Use   Vaping status: Never Used  Substance Use Topics   Alcohol use: Never   Drug use: Never   Social History   Social History Narrative   ** Merged History Encounter **         Immunization History  Administered Date(s) Administered   INFLUENZA, HIGH DOSE SEASONAL PF 05/22/2019   Influenza Split 05/22/2019, 07/18/2021   Influenza, Seasonal, Injecte, Preservative Fre 06/19/2023   Influenza-Unspecified 05/28/2019   PFIZER(Purple Top)SARS-COV-2 Vaccination  12/18/2019, 01/18/2020     Objective: Vital Signs: There were no vitals taken for this visit.   Physical Exam   Musculoskeletal Exam: ***  CDAI Exam: CDAI Score: -- Patient Global: --; Provider Global: -- Swollen: --; Tender: -- Joint Exam 07/13/2024   No joint exam has been documented for this visit   There is currently no information documented on the homunculus. Go to the Rheumatology activity and complete the homunculus joint exam.  Investigation: No additional findings.  Imaging: MR BRAIN W WO CONTRAST Result Date: 06/09/2024 CLINICAL DATA:  Headaches EXAM: MRI HEAD WITHOUT AND WITH CONTRAST TECHNIQUE: Multiplanar, multiecho pulse sequences of the brain and surrounding structures were obtained without and with intravenous contrast. CONTRAST:  10mL GADAVIST  GADOBUTROL  1 MMOL/ML IV SOLN COMPARISON:  September 25, 2023 FINDINGS: MRI brain: The brain volume is normal. The  signal in the brain parenchyma is normal. No abnormal enhancement. There is no acute or chronic infarct. The ventricles are normal. No mass lesion. There are normal flow signals in the carotid arteries and basilar artery. No significant bone marrow signal abnormality. No significant abnormality in the paranasal sinuses or soft tissues. IMPRESSION: Normal Electronically Signed   By: Nancyann Burns M.D.   On: 06/09/2024 11:54    Recent Labs: Lab Results  Component Value Date   WBC 5.1 05/27/2024   HGB 10.1 (L) 05/27/2024   PLT 247 05/27/2024   NA 142 05/27/2024   K 3.6 05/27/2024   CL 106 05/27/2024   CO2 26 05/27/2024   GLUCOSE 103 (H) 05/27/2024   BUN 14 05/27/2024   CREATININE 0.64 05/27/2024   BILITOT 0.3 03/16/2024   ALKPHOS 287 (H) 03/16/2024   AST 51 (H) 03/16/2024   ALT 76 (H) 03/16/2024   PROT 6.8 03/16/2024   ALBUMIN 4.0 03/16/2024   CALCIUM 10.1 05/27/2024   GFRAA >60 05/11/2020    Speciality Comments: No specialty comments available.  Procedures:  No procedures performed Allergies: Acetaminophen -codeine, Bee venom, Meloxicam, Tramadol, Codeine, Coconut (cocos nucifera), and Tomato   Assessment / Plan:     Visit Diagnoses: No diagnosis found.  Orders: No orders of the defined types were placed in this encounter.  No orders of the defined types were placed in this encounter.   Face-to-face time spent with patient was *** minutes. Greater than 50% of time was spent in counseling and coordination of care.  Follow-Up Instructions: No follow-ups on file.   Alfonso Patterson, LPN  Note - This record has been created using [employer].  Chart creation errors have been sought, but may not always  have been located. Such creation errors do not reflect on  the standard of medical care.

## 2024-07-01 ENCOUNTER — Encounter: Payer: Self-pay | Admitting: Oncology

## 2024-07-01 ENCOUNTER — Ambulatory Visit (HOSPITAL_COMMUNITY): Admitting: Licensed Clinical Social Worker

## 2024-07-01 ENCOUNTER — Encounter (HOSPITAL_COMMUNITY): Payer: Self-pay

## 2024-07-01 ENCOUNTER — Inpatient Hospital Stay: Attending: Oncology | Admitting: Oncology

## 2024-07-01 ENCOUNTER — Inpatient Hospital Stay

## 2024-07-01 VITALS — BP 142/67 | HR 75 | Temp 97.7°F | Resp 18 | Ht 65.0 in | Wt 303.6 lb

## 2024-07-01 DIAGNOSIS — M79606 Pain in leg, unspecified: Secondary | ICD-10-CM | POA: Insufficient documentation

## 2024-07-01 DIAGNOSIS — Z91018 Allergy to other foods: Secondary | ICD-10-CM | POA: Insufficient documentation

## 2024-07-01 DIAGNOSIS — Z9049 Acquired absence of other specified parts of digestive tract: Secondary | ICD-10-CM | POA: Insufficient documentation

## 2024-07-01 DIAGNOSIS — K219 Gastro-esophageal reflux disease without esophagitis: Secondary | ICD-10-CM | POA: Diagnosis not present

## 2024-07-01 DIAGNOSIS — D6851 Activated protein C resistance: Secondary | ICD-10-CM | POA: Insufficient documentation

## 2024-07-01 DIAGNOSIS — F445 Conversion disorder with seizures or convulsions: Secondary | ICD-10-CM

## 2024-07-01 DIAGNOSIS — J45909 Unspecified asthma, uncomplicated: Secondary | ICD-10-CM | POA: Diagnosis not present

## 2024-07-01 DIAGNOSIS — Z79899 Other long term (current) drug therapy: Secondary | ICD-10-CM | POA: Insufficient documentation

## 2024-07-01 DIAGNOSIS — Z8249 Family history of ischemic heart disease and other diseases of the circulatory system: Secondary | ICD-10-CM | POA: Insufficient documentation

## 2024-07-01 DIAGNOSIS — I639 Cerebral infarction, unspecified: Secondary | ICD-10-CM | POA: Diagnosis not present

## 2024-07-01 DIAGNOSIS — Z888 Allergy status to other drugs, medicaments and biological substances status: Secondary | ICD-10-CM | POA: Insufficient documentation

## 2024-07-01 DIAGNOSIS — Z90710 Acquired absence of both cervix and uterus: Secondary | ICD-10-CM | POA: Insufficient documentation

## 2024-07-01 DIAGNOSIS — Z8701 Personal history of pneumonia (recurrent): Secondary | ICD-10-CM | POA: Insufficient documentation

## 2024-07-01 DIAGNOSIS — I10 Essential (primary) hypertension: Secondary | ICD-10-CM

## 2024-07-01 DIAGNOSIS — G40909 Epilepsy, unspecified, not intractable, without status epilepticus: Secondary | ICD-10-CM | POA: Insufficient documentation

## 2024-07-01 DIAGNOSIS — Z833 Family history of diabetes mellitus: Secondary | ICD-10-CM | POA: Insufficient documentation

## 2024-07-01 DIAGNOSIS — Z885 Allergy status to narcotic agent status: Secondary | ICD-10-CM | POA: Insufficient documentation

## 2024-07-01 DIAGNOSIS — Z803 Family history of malignant neoplasm of breast: Secondary | ICD-10-CM | POA: Insufficient documentation

## 2024-07-01 DIAGNOSIS — I110 Hypertensive heart disease with heart failure: Secondary | ICD-10-CM | POA: Insufficient documentation

## 2024-07-01 DIAGNOSIS — F419 Anxiety disorder, unspecified: Secondary | ICD-10-CM | POA: Diagnosis not present

## 2024-07-01 DIAGNOSIS — I4891 Unspecified atrial fibrillation: Secondary | ICD-10-CM | POA: Diagnosis not present

## 2024-07-01 DIAGNOSIS — Z79890 Hormone replacement therapy (postmenopausal): Secondary | ICD-10-CM | POA: Insufficient documentation

## 2024-07-01 DIAGNOSIS — E785 Hyperlipidemia, unspecified: Secondary | ICD-10-CM | POA: Diagnosis not present

## 2024-07-01 DIAGNOSIS — Z86711 Personal history of pulmonary embolism: Secondary | ICD-10-CM

## 2024-07-01 DIAGNOSIS — Z5982 Transportation insecurity: Secondary | ICD-10-CM | POA: Diagnosis not present

## 2024-07-01 DIAGNOSIS — Z7901 Long term (current) use of anticoagulants: Secondary | ICD-10-CM | POA: Insufficient documentation

## 2024-07-01 DIAGNOSIS — D649 Anemia, unspecified: Secondary | ICD-10-CM | POA: Insufficient documentation

## 2024-07-01 DIAGNOSIS — F17200 Nicotine dependence, unspecified, uncomplicated: Secondary | ICD-10-CM | POA: Insufficient documentation

## 2024-07-01 DIAGNOSIS — G4733 Obstructive sleep apnea (adult) (pediatric): Secondary | ICD-10-CM | POA: Insufficient documentation

## 2024-07-01 DIAGNOSIS — Z832 Family history of diseases of the blood and blood-forming organs and certain disorders involving the immune mechanism: Secondary | ICD-10-CM | POA: Insufficient documentation

## 2024-07-01 DIAGNOSIS — Z87828 Personal history of other (healed) physical injury and trauma: Secondary | ICD-10-CM | POA: Diagnosis not present

## 2024-07-01 DIAGNOSIS — Z5941 Food insecurity: Secondary | ICD-10-CM | POA: Diagnosis not present

## 2024-07-01 DIAGNOSIS — R0789 Other chest pain: Secondary | ICD-10-CM | POA: Insufficient documentation

## 2024-07-01 DIAGNOSIS — Z8673 Personal history of transient ischemic attack (TIA), and cerebral infarction without residual deficits: Secondary | ICD-10-CM | POA: Insufficient documentation

## 2024-07-01 DIAGNOSIS — I509 Heart failure, unspecified: Secondary | ICD-10-CM

## 2024-07-01 DIAGNOSIS — F4310 Post-traumatic stress disorder, unspecified: Secondary | ICD-10-CM | POA: Insufficient documentation

## 2024-07-01 DIAGNOSIS — M7989 Other specified soft tissue disorders: Secondary | ICD-10-CM | POA: Insufficient documentation

## 2024-07-01 LAB — CBC WITH DIFFERENTIAL (CANCER CENTER ONLY)
Abs Immature Granulocytes: 0.01 K/uL (ref 0.00–0.07)
Basophils Absolute: 0 K/uL (ref 0.0–0.1)
Basophils Relative: 0 %
Eosinophils Absolute: 0.1 K/uL (ref 0.0–0.5)
Eosinophils Relative: 2 %
HCT: 35.7 % — ABNORMAL LOW (ref 36.0–46.0)
Hemoglobin: 11.5 g/dL — ABNORMAL LOW (ref 12.0–15.0)
Immature Granulocytes: 0 %
Lymphocytes Relative: 19 %
Lymphs Abs: 1.2 K/uL (ref 0.7–4.0)
MCH: 26.9 pg (ref 26.0–34.0)
MCHC: 32.2 g/dL (ref 30.0–36.0)
MCV: 83.4 fL (ref 80.0–100.0)
Monocytes Absolute: 0.5 K/uL (ref 0.1–1.0)
Monocytes Relative: 7 %
Neutro Abs: 4.6 K/uL (ref 1.7–7.7)
Neutrophils Relative %: 72 %
Platelet Count: 227 K/uL (ref 150–400)
RBC: 4.28 MIL/uL (ref 3.87–5.11)
RDW: 14.6 % (ref 11.5–15.5)
WBC Count: 6.4 K/uL (ref 4.0–10.5)
nRBC: 0 % (ref 0.0–0.2)

## 2024-07-01 LAB — CMP (CANCER CENTER ONLY)
ALT: 7 U/L (ref 0–44)
AST: 10 U/L — ABNORMAL LOW (ref 15–41)
Albumin: 4.1 g/dL (ref 3.5–5.0)
Alkaline Phosphatase: 165 U/L — ABNORMAL HIGH (ref 38–126)
Anion gap: 4 — ABNORMAL LOW (ref 5–15)
BUN: 13 mg/dL (ref 6–20)
CO2: 30 mmol/L (ref 22–32)
Calcium: 11 mg/dL — ABNORMAL HIGH (ref 8.9–10.3)
Chloride: 106 mmol/L (ref 98–111)
Creatinine: 0.83 mg/dL (ref 0.44–1.00)
GFR, Estimated: >60 mL/min (ref >60)
Glucose, Bld: 91 mg/dL (ref 70–99)
Potassium: 3.6 mmol/L (ref 3.5–5.1)
Sodium: 140 mmol/L (ref 135–145)
Total Bilirubin: 0.3 mg/dL (ref 0.0–1.2)
Total Protein: 7.6 g/dL (ref 6.5–8.1)

## 2024-07-01 LAB — IRON AND IRON BINDING CAPACITY (CC-WL,HP ONLY)
Iron: 36 ug/dL (ref 28–170)
Saturation Ratios: 10 % — ABNORMAL LOW (ref 10.4–31.8)
TIBC: 372 ug/dL (ref 250–450)
UIBC: 336 ug/dL (ref 148–442)

## 2024-07-01 LAB — FERRITIN: Ferritin: 46 ng/mL (ref 11–307)

## 2024-07-01 LAB — D-DIMER, QUANTITATIVE: D-Dimer, Quant: <0.27 ug{FEU}/mL (ref 0.00–0.50)

## 2024-07-01 NOTE — Progress Notes (Signed)
Referral placed for social worker. ?

## 2024-07-01 NOTE — Progress Notes (Signed)
 [facility]  HEMATOLOGY CLINIC CONSULTATION NOTE   PATIENT NAME: Meghan [name]   MR#: [id number] DOB: 12/27/1965  DATE OF SERVICE: 07/01/2024   REFERRING PROVIDER  Asberry Claw, DO  Patient Care Team: Health, [facility] as PCP - General Branch, Ronal BRAVO, MD (Inactive) as PCP - Cardiology (Cardiology) Mealor, Eulas BRAVO, MD as PCP - Electrophysiology (Cardiology) Center, [facility] [place], Vinie NOVAK, MD as Referring Physician (Cardiology) Catalina Bare, MD as Consulting Physician (Internal Medicine) Sheldon Standing, MD as Consulting Physician (General Surgery) Rollin Dover, MD as Consulting Physician (Gastroenterology) Georgina Elgin Isela DOUGLAS, MD as Referring Physician (Orthopedic Surgery) Elizbeth Leita Ruth, FNP (Inactive) as Nurse Practitioner (Family Medicine) Claw Asberry, DO as Consulting Physician (Internal Medicine)   REASON FOR CONSULTATION/ CHIEF COMPLAINT: Hx of recurrent thromboembolism and CVAs.   ASSESSMENT & PLAN:  Meghan [name] is a 58 y.o. lady with a past medical history of hypertension, dyslipidemia, CHF, OSA on CPAP, seizure disorder, CVAs, recurrent thromboembolism, status post IVC filter placement in 2003, history of A-fib status post ablation, was referred to our service for evaluation of underlying thrombophilia state.    History of pulmonary embolus (PE) Two strokes, last in 2014, and recurrent pulmonary emboli, last significant event in 2003.   On lifelong anticoagulation with Xarelto .   IVC filter placed in 2003, non-removable.   No recent thromboembolic events. Intermittent leg swelling likely due to prolonged standing, without pain or redness.   Family history of clotting issues in father.   Given her history of recurrent thromboembolism and history of CVAs, it would be prudent to pursue thrombophilia workup.  Hence today we checked prothrombin gene mutation, factor V Leiden mutation, beta-2 glycoprotein  antibodies, anticardiolipin antibodies, protein C activity, protein S activity, Antithrombin III activity.  Regardless of these test results, she needs to remain on indefinite anticoagulation given her history of recurrent thromboembolism.  D-dimer was undetectable today.  - Continue Xarelto  for lifelong anticoagulation  I will discuss results over the phone in the next 2 to 3 weeks.  No additional follow-up appointments were arranged at this time.  Anemia Hemoglobin levels fluctuating between 11-12, recent drop to 10 in September. No reported blood loss. No history of iron or B12 supplementation. Further evaluation planned. - Check iron levels  Hx of Atrial fibrillation Successfully treated with ablation. No recurrence of symptoms since procedure.  I reviewed lab results and outside records for this visit and discussed relevant results with the patient. Diagnosis, plan of care and treatment options were also discussed in detail with the patient. Opportunity provided to ask questions and answers provided to her apparent satisfaction. Provided instructions to call our clinic with any problems, questions or concerns prior to return visit. I recommended to continue follow-up with PCP and sub-specialists. She verbalized understanding and agreed with the plan. No barriers to learning was detected.  Kannan Proia, MD  07/01/2024 5:26 PM  [facility] - A DEPT OF [facility] [address] [place] [address] Dept: [phone] Dept Fax: [phone]   HISTORY OF PRESENT ILLNESS:   Discussed the use of AI scribe software for clinical note transcription with the patient, who gave verbal consent to proceed.  History of Present Illness Meghan [name] is a 58 year old female with a history of strokes and pulmonary embolism who presents for hematology evaluation. She was referred by her rheumatologist for evaluation of her history  of strokes and clots.  She has experienced two strokes, the first in 1989 during pregnancy with twins and significant work stress, resulting in a 16-day hospitalization. The second stroke occurred in 2014, two weeks after her father's passing, leading to left-sided weakness.  She has been on anticoagulation therapy since 1989, initially on Coumadin and currently on Xarelto , taken once daily without issues. She also has a history of atrial fibrillation, for which she underwent an ablation and has not experienced further issues.  She experienced pulmonary embolism starting in 1998, with the last episode in 2003, after which an inferior vena cava filter was placed. She occasionally experiences leg swelling, particularly after prolonged standing, and reports pain radiating up and down her leg.  She denied dyspnea, but did report a recent episode of chest pain, which she attributed to acid reflux. A recent episode of chest pain was attributed to acid reflux. No noticeable blood loss and she has not been on iron or B12 supplements despite slight anemia noted in recent labs.  Her father had clotting problems and was on blood thinners. She moved from [place] to her current location in 2020.     MEDICAL HISTORY Past Medical History:  Diagnosis Date   (HFpEF) heart failure with preserved ejection fraction (HCC) 01/2019   a.) Dx'd in [place] , [place]; b.) TTE 08/14/2022: EF 60-65%, mod LVH, mild LAE, triv MR   Anxiety    Asthma 05/69   Atrial fibrillation and flutter (HCC) 02/16/2019   a.) CHA2DS2VASc = 6 (sex, HFpEF, HTN, CVA x2, T2DM);  b.) s/p DCCV (200 J x 1) 08/22/2022; c.) s/p RF ablation (CTI) 11/02/2022; d.) rate/rhythm maintained on oral flecainide  + metoprolol  succinate; chronically anticoagulated with rivaroxaban    CHF (congestive heart failure) (HCC) 01/2019   Class 3 severe obesity due to excess calories with body mass index (BMI) of 50.0 to 59.9 in adult    DDD (degenerative disc disease),  lumbar    GERD (gastroesophageal reflux disease)    Heart murmur 07/2017   Hiatal hernia    History of head injury    HTN (hypertension)    Hypercholesterolemia    Ileus (HCC)    Intractable nausea and vomiting 11/16/2020   Long term current use of anticoagulant    a.) rivaroxaban    Macular degeneration of left eye    MDD (major depressive disorder)    OA (osteoarthritis)    OSA on CPAP    Pneumonia    Presence of IVC filter    PTSD (post-traumatic stress disorder)    Recurrent pulmonary emboli (HCC)    a.) s/p IVC filter placement 2003; b.) previously followed by hematology when living in [place]   Seizure disorder (HCC)    a.) last was in 2020 per pt report   Stroke (HCC) 02/1995   1989 and 1995 (left sided weakness)   T2DM (type 2 diabetes mellitus) (HCC)    Vitamin D deficiency      SURGICAL HISTORY Past Surgical History:  Procedure Laterality Date   A-FLUTTER ABLATION N/A 11/02/2022   Procedure: A-FLUTTER ABLATION;  Surgeon: Mealor, Eulas BRAVO, MD;  Location: [facility];  Service: Cardiovascular;  Laterality: N/A;   ARTHRODESIS METATARSALPHALANGEAL JOINT (MTPJ) Left 02/25/2023   Procedure: ARTHRODESIS METATARSALPHALANGEAL JOINT (MTPJ);  Surgeon: Tobie Franky SQUIBB, DPM;  Location: [facility] ORS;  Service: Podiatry;  Laterality: Left;   CARDIAC CATHETERIZATION  2017   in [facility] in [place]   CARDIOVERSION N/A 08/22/2022   Procedure: CARDIOVERSION;  Surgeon: Sheena Pugh, DO;  Location: [facility];  Service: Cardiovascular;  Laterality: N/A;   CESAREAN SECTION N/A 1989   CESAREAN SECTION N/A 1993   CHOLECYSTECTOMY  2003   COLONOSCOPY WITH PROPOFOL  N/A 06/05/2019   Procedure: COLONOSCOPY WITH PROPOFOL ;  Surgeon: Rollin Dover, MD;  Location: [facility];  Service: Endoscopy;  Laterality: N/A;   CYST REMOVAL WITH BONE GRAFT Left 10/18/2020   Procedure: BONE GRAFTING OF ENCHONDROMA MIDDLE PHANLANX OF LEFT MIDDLE FINGER;  Surgeon: Murrell Kuba, MD;   Location: [facility] OR;  Service: Orthopedics;  Laterality: Left;  AXILLARY BLOCK   DIAGNOSTIC LAPAROSCOPY  2015; 2017   lap hernia repair x2   ENDOVENOUS ABLATION SAPHENOUS VEIN W/ LASER Left 08/31/2021   endovenous laser ablation left greater saphenous vein and stab phlebectomy 10-20 incisions left leg by Penne Colorado MD   HALLUX FUSION Right 05/28/2022   Procedure: HALLUX FUSION METATARSAL PHALANGEAL JOIT;  Surgeon: Tobie Franky SQUIBB, DPM;  Location: [facility];  Service: Podiatry;  Laterality: Right;  BLOCK   HERNIA REPAIR  2014, 2018   umbilical hernia repair   IVC FILTER INSERTION  2003   POLYPECTOMY  06/05/2019   Procedure: POLYPECTOMY;  Surgeon: Rollin Dover, MD;  Location: [facility];  Service: Endoscopy;;   SHOULDER ARTHROSCOPY W/ ROTATOR CUFF REPAIR Right 08/28/2017   TOTAL ABDOMINAL HYSTERECTOMY  2003   UPPER GI ENDOSCOPY     WRIST ARTHROSCOPY WITH DEBRIDEMENT Left 10/18/2020   Procedure: LEFT WRIST ARTHROSCOPY WITH DEBRIDEMENT;  Surgeon: Murrell Kuba, MD;  Location: [facility];  Service: Orthopedics;  Laterality: Left;  AXILLARY BLOCK   WRIST ARTHROSCOPY WITH DEBRIDEMENT Right 01/25/2022   Procedure: ARTHROSCOPY RIGHT WRIST WITH  DEBRIDEMENT/ SHRINKAGE;  Surgeon: Murrell Franky, MD;  Location: [facility];  Service: Orthopedics;  Laterality: Right;     SOCIAL HISTORY: She reports that she has never smoked. She has never been exposed to tobacco smoke. She has never used smokeless tobacco. She reports that she does not drink alcohol and does not use drugs. Social History   Socioeconomic History   Marital status: Divorced    Spouse name: Not on file   Number of children: 3   Years of education: Not on file   Highest education level: Not on file  Occupational History   Not on file  Tobacco Use   Smoking status: Never    Passive exposure: Never   Smokeless tobacco: Never  Vaping Use   Vaping status: Never Used  Substance and Sexual Activity   Alcohol use: Never   Drug use:  Never   Sexual activity: Not Currently    Birth control/protection: Surgical    Comment: Hysterectomy  Other Topics Concern   Not on file  Social History Narrative   ** Merged History Encounter **       Social Drivers of Health   Financial Resource Strain: Patient Declined (01/13/2024)   Received from [facility]   Overall Financial Resource Strain (CARDIA)    Difficulty of Paying Living Expenses: Patient declined  Food Insecurity: Food Insecurity Present (07/01/2024)   Hunger Vital Sign    Worried About Running Out of Food in the Last Year: Sometimes true    Ran Out of Food in the Last Year: Sometimes true  Transportation Needs: Unmet Transportation Needs (07/01/2024)   PRAPARE - Administrator, Civil Service (Medical): Yes    Lack of Transportation (Non-Medical): Yes  Physical Activity: Not on file  Stress: Not on file  Social  Connections: Unknown (01/30/2022)   Received from [facility]   Social Network    Social Network: Not on file  Intimate Partner Violence: Not At Risk (07/01/2024)   Humiliation, Afraid, Rape, and Kick questionnaire    Fear of Current or Ex-Partner: No    Emotionally Abused: No    Physically Abused: No    Sexually Abused: No    FAMILY HISTORY: Her family history includes Arrhythmia in her paternal grandmother; Breast cancer in her maternal grandmother and mother; Clotting disorder in her father; Congestive Heart Failure in her father; Diabetes in her father; Heart attack in her father; Heart disease in her father; Heart failure in her brother and father; Hypertension in her father.  CURRENT MEDICATIONS   Current Outpatient Medications  Medication Instructions   albuterol  (VENTOLIN  HFA) 108 (90 Base) MCG/ACT inhaler 2 puffs, Every 6 hours PRN   amitriptyline (ELAVIL) 10 mg, Daily at bedtime   bumetanide  (BUMEX ) 1 mg, Oral, Daily   Fish Oil 1,200 mg, Daily with breakfast   Mounjaro 12.5 mg, Weekly   ofloxacin (FLOXIN) 0.3 % OTIC  solution 5 drops, 2 times daily   oxyCODONE -acetaminophen  (PERCOCET) 7.5-325 MG tablet 1 tablet, Every 6 hours PRN   rivaroxaban  (XARELTO ) 20 mg, Daily at bedtime   sertraline  (ZOLOFT ) 50 mg, Every morning   Vitamin D-3 5,000 Units, Daily   zolpidem  (AMBIEN  CR) 6.25 mg, Oral, Daily at bedtime     ALLERGIES  She is allergic to acetaminophen -codeine, bee venom, meloxicam, tramadol, codeine, coconut (cocos nucifera), and tomato.  REVIEW OF SYSTEMS:  Review of Systems - Oncology   Rest of the pertinent review of systems is unremarkable except as mentioned above in HPI.  PHYSICAL EXAMINATION:    Onc Performance Status - 07/01/24 1334       ECOG Perf Status   ECOG Perf Status Restricted in physically strenuous activity but ambulatory and able to carry out work of a light or sedentary nature, e.g., light house work, office work      KPS SCALE   KPS % SCORE Normal activity with effort, some s/s of disease          Vitals:   07/01/24 1323 07/01/24 1330  BP: (!) 142/67 (!) 142/67  Pulse: 75 75  Resp: 18 18  Temp: 97.7 F (36.5 C) 97.7 F (36.5 C)  SpO2: 100% 100%   Filed Weights   07/01/24 1330  Weight: (!) 303 lb 9.6 oz (137.7 kg)    Physical Exam Constitutional:      General: She is not in acute distress.    Appearance: Normal appearance.  HENT:     Head: Normocephalic and atraumatic.  Cardiovascular:     Rate and Rhythm: Normal rate.  Pulmonary:     Effort: Pulmonary effort is normal. No respiratory distress.  Abdominal:     General: There is no distension.  Neurological:     General: No focal deficit present.     Mental Status: She is alert and oriented to person, place, and time.  Psychiatric:        Mood and Affect: Mood normal.        Behavior: Behavior normal.     LABORATORY DATA:   I have reviewed the data as listed.  Results for orders placed or performed in visit on 07/01/24  Ferritin  Result Value Ref Range   Ferritin 46 11 - 307 ng/mL   Iron and Iron Binding Capacity ([facility] only)  Result Value Ref Range  Iron 36 28 - 170 ug/dL   TIBC 627 749 - 549 ug/dL   Saturation Ratios 10 (L) 10.4 - 31.8 %   UIBC 336 148 - 442 ug/dL  D-dimer, quantitative  Result Value Ref Range   D-Dimer, Quant <0.27 0.00 - 0.50 ug/mL-FEU  CMP (Cancer Center only)  Result Value Ref Range   Sodium 140 135 - 145 mmol/L   Potassium 3.6 3.5 - 5.1 mmol/L   Chloride 106 98 - 111 mmol/L   CO2 30 22 - 32 mmol/L   Glucose, Bld 91 70 - 99 mg/dL   BUN 13 6 - 20 mg/dL   Creatinine 9.16 9.55 - 1.00 mg/dL   Calcium 88.9 (H) 8.9 - 10.3 mg/dL   Total Protein 7.6 6.5 - 8.1 g/dL   Albumin 4.1 3.5 - 5.0 g/dL   AST 10 (L) 15 - 41 U/L   ALT 7 0 - 44 U/L   Alkaline Phosphatase 165 (H) 38 - 126 U/L   Total Bilirubin 0.3 0.0 - 1.2 mg/dL   GFR, Estimated >39 >39 mL/min   Anion gap 4 (L) 5 - 15  CBC with Differential (Cancer Center Only)  Result Value Ref Range   WBC Count 6.4 4.0 - 10.5 K/uL   RBC 4.28 3.87 - 5.11 MIL/uL   Hemoglobin 11.5 (L) 12.0 - 15.0 g/dL   HCT 64.2 (L) 63.9 - 53.9 %   MCV 83.4 80.0 - 100.0 fL   MCH 26.9 26.0 - 34.0 pg   MCHC 32.2 30.0 - 36.0 g/dL   RDW 85.3 88.4 - 84.4 %   Platelet Count 227 150 - 400 K/uL   nRBC 0.0 0.0 - 0.2 %   Neutrophils Relative % 72 %   Neutro Abs 4.6 1.7 - 7.7 K/uL   Lymphocytes Relative 19 %   Lymphs Abs 1.2 0.7 - 4.0 K/uL   Monocytes Relative 7 %   Monocytes Absolute 0.5 0.1 - 1.0 K/uL   Eosinophils Relative 2 %   Eosinophils Absolute 0.1 0.0 - 0.5 K/uL   Basophils Relative 0 %   Basophils Absolute 0.0 0.0 - 0.1 K/uL   Immature Granulocytes 0 %   Abs Immature Granulocytes 0.01 0.00 - 0.07 K/uL     RADIOGRAPHIC STUDIES:  I have personally reviewed the radiological images as listed and agreed with the findings in the report.  MR BRAIN W WO CONTRAST Result Date: 06/09/2024 CLINICAL DATA:  Headaches EXAM: MRI HEAD WITHOUT AND WITH CONTRAST TECHNIQUE: Multiplanar, multiecho pulse sequences of  the brain and surrounding structures were obtained without and with intravenous contrast. CONTRAST:  10mL GADAVIST  GADOBUTROL  1 MMOL/ML IV SOLN COMPARISON:  September 25, 2023 FINDINGS: MRI brain: The brain volume is normal. The signal in the brain parenchyma is normal. No abnormal enhancement. There is no acute or chronic infarct. The ventricles are normal. No mass lesion. There are normal flow signals in the carotid arteries and basilar artery. No significant bone marrow signal abnormality. No significant abnormality in the paranasal sinuses or soft tissues. IMPRESSION: Normal Electronically Signed   By: Nancyann Burns M.D.   On: 06/09/2024 11:54    Orders Placed This Encounter  Procedures   CBC with Differential (Cancer Center Only)    Standing Status:   Future    Number of Occurrences:   1    Expiration Date:   07/01/2025   CMP (Cancer Center only)    Standing Status:   Future    Number of Occurrences:  1    Expiration Date:   07/01/2025   D-dimer, quantitative    Standing Status:   Future    Number of Occurrences:   1    Expiration Date:   07/01/2025   Factor 5 leiden    Standing Status:   Future    Number of Occurrences:   1    Expiration Date:   07/01/2025   Prothrombin gene mutation    Standing Status:   Future    Number of Occurrences:   1    Expiration Date:   07/01/2025   Beta-2-glycoprotein i abs, IgG/M/A    Standing Status:   Future    Number of Occurrences:   1    Expiration Date:   07/01/2025   Cardiolipin antibodies, IgG, IgM, IgA    Standing Status:   Future    Number of Occurrences:   1    Expiration Date:   07/01/2025   Protein C activity    Standing Status:   Future    Number of Occurrences:   1    Expiration Date:   07/01/2025   PROTEIN S PANEL    Standing Status:   Future    Number of Occurrences:   1    Expiration Date:   07/01/2025   Antithrombin panel    Standing Status:   Future    Number of Occurrences:   1    Expiration Date:   07/01/2025   Iron  and Iron Binding Capacity ([facility] only)    Standing Status:   Future    Number of Occurrences:   1    Expiration Date:   07/01/2025   Ferritin    Standing Status:   Future    Number of Occurrences:   1    Expiration Date:   07/01/2025   Ambulatory referral to Social Work    Referral Priority:   Routine    Referral Type:   Consultation    Referral Reason:   Specialty Services Required    Number of Visits Requested:   1    Future Appointments  Date Time Provider Department Center  07/06/2024  1:00 PM Carlie Huxley [facility] None  07/10/2024  8:45 AM Tobie Franky SQUIBB, DPM [facility]  07/13/2024 11:00 AM Szer, Asberry, DO [facility] None  07/13/2024  3:00 PM Carlie Huxley [facility] None  07/15/2024  3:15 PM Sonja Manseau, [name], MD [facility] None    I spent a total of 55 minutes during this encounter with the patient including review of chart and various tests results, discussions about plan of care and coordination of care plan.  This document was completed utilizing speech recognition software. Grammatical errors, random word insertions, pronoun errors, and incomplete sentences are an occasional consequence of this system due to software limitations, ambient noise, and hardware issues. Any formal questions or concerns about the content, text or information contained within the body of this dictation should be directly addressed to the provider for clarification.

## 2024-07-01 NOTE — Assessment & Plan Note (Signed)
 Two strokes, last in 2014, and recurrent pulmonary emboli, last significant event in 2003.   On lifelong anticoagulation with Xarelto .   IVC filter placed in 2003, non-removable.   No recent thromboembolic events. Intermittent leg swelling likely due to prolonged standing, without pain or redness.   Family history of clotting issues in father.   Given her history of recurrent thromboembolism and history of CVAs, it would be prudent to pursue thrombophilia workup.  Hence today we checked prothrombin gene mutation, factor V Leiden mutation, beta-2 glycoprotein antibodies, anticardiolipin antibodies, protein C activity, protein S activity, Antithrombin III activity.  Regardless of these test results, she needs to remain on indefinite anticoagulation given her history of recurrent thromboembolism.  D-dimer was undetectable today.  - Continue Xarelto  for lifelong anticoagulation  I will discuss results over the phone in the next 2 to 3 weeks.  No additional follow-up appointments were arranged at this time.

## 2024-07-02 LAB — BETA-2-GLYCOPROTEIN I ABS, IGG/M/A
Beta-2 Glyco I IgG: <9 GPI IgG units (ref 0–20)
Beta-2-Glycoprotein I IgA: <9 GPI IgA units (ref 0–25)
Beta-2-Glycoprotein I IgM: <9 GPI IgM units (ref 0–32)

## 2024-07-02 LAB — PROTEIN S PANEL
Protein S Activity: 125 % (ref 63–140)
Protein S Ag, Free: 94 % (ref 61–136)
Protein S Ag, Total: 100 % (ref 60–150)

## 2024-07-02 LAB — ANTITHROMBIN PANEL
AT III AG PPP IMM-ACNC: 130 % — ABNORMAL HIGH (ref 72–124)
Antithrombin Activity: 186 % — ABNORMAL HIGH (ref 75–135)

## 2024-07-02 LAB — PROTEIN C ACTIVITY: Protein C Activity: 150 % (ref 73–180)

## 2024-07-03 ENCOUNTER — Telehealth: Payer: Self-pay

## 2024-07-03 LAB — CARDIOLIPIN ANTIBODIES, IGG, IGM, IGA
Anticardiolipin IgA: <9 U/mL (ref 0–11)
Anticardiolipin IgG: <9 GPL U/mL (ref 0–14)
Anticardiolipin IgM: 16 [MPL'U]/mL — ABNORMAL HIGH (ref 0–12)

## 2024-07-03 NOTE — Telephone Encounter (Signed)
 If needed, Noelle's call back number is [phone].

## 2024-07-03 NOTE — Telephone Encounter (Signed)
 Noelle from [facility] contacted the office and states the patient has been having Temporal headaches and pulsing for a few months now. Sydelle states she is going to send over labs to our office. Sydelle states she believes the patient may need a temporal artery biopsy but had wanted to consult with Dr. Luba first. Advised Noelle Dr. Luba is out of the office and would not be back until next week. Advised Noelle I would speak to Dr. Dolphus.   Per Dr. Dolphus, [facility] should go ahead and order the Biopsy and get the patient on prednisone if they feel it is urgent.   Contacted Noelle back and advised Per Dr. Dolphus, [facility] should go ahead and order the Biopsy and get the patient on prednisone if they feel it is urgent. Sydelle states they will go ahead and order the biopsy and will speak with the patient about ordering the prednisone as well. Sydelle states the patient had been on a trial of Prednisone 20 mg before and will plan to prescribe the patient the recommended 60 mg dose. Myles Sydelle I would send everything to Dr. Luba for her to see when she gets back as well. Noelle verbalized understanding.

## 2024-07-05 ENCOUNTER — Emergency Department (HOSPITAL_COMMUNITY)

## 2024-07-05 ENCOUNTER — Other Ambulatory Visit: Payer: Self-pay

## 2024-07-05 ENCOUNTER — Encounter (HOSPITAL_COMMUNITY): Payer: Self-pay

## 2024-07-05 ENCOUNTER — Emergency Department (HOSPITAL_COMMUNITY)
Admission: EM | Admit: 2024-07-05 | Discharge: 2024-07-05 | Disposition: A | Discharge status: Home / Self Care | Attending: Emergency Medicine | Admitting: Emergency Medicine

## 2024-07-05 DIAGNOSIS — Z7901 Long term (current) use of anticoagulants: Secondary | ICD-10-CM | POA: Insufficient documentation

## 2024-07-05 DIAGNOSIS — I509 Heart failure, unspecified: Secondary | ICD-10-CM | POA: Insufficient documentation

## 2024-07-05 DIAGNOSIS — J4521 Mild intermittent asthma with (acute) exacerbation: Secondary | ICD-10-CM | POA: Insufficient documentation

## 2024-07-05 DIAGNOSIS — D649 Anemia, unspecified: Secondary | ICD-10-CM | POA: Insufficient documentation

## 2024-07-05 DIAGNOSIS — Z720 Tobacco use: Secondary | ICD-10-CM | POA: Insufficient documentation

## 2024-07-05 DIAGNOSIS — R0602 Shortness of breath: Secondary | ICD-10-CM | POA: Diagnosis present

## 2024-07-05 LAB — CBC
HCT: 37.5 % (ref 36.0–46.0)
Hemoglobin: 11.7 g/dL — ABNORMAL LOW (ref 12.0–15.0)
MCH: 26.6 pg (ref 26.0–34.0)
MCHC: 31.2 g/dL (ref 30.0–36.0)
MCV: 85.2 fL (ref 80.0–100.0)
Platelets: 240 K/uL (ref 150–400)
RBC: 4.4 MIL/uL (ref 3.87–5.11)
RDW: 14.4 % (ref 11.5–15.5)
WBC: 5.4 K/uL (ref 4.0–10.5)
nRBC: 0 % (ref 0.0–0.2)

## 2024-07-05 LAB — BASIC METABOLIC PANEL WITH GFR
Anion gap: 9 (ref 5–15)
BUN: 14 mg/dL (ref 6–20)
CO2: 27 mmol/L (ref 22–32)
Calcium: 10.5 mg/dL — ABNORMAL HIGH (ref 8.9–10.3)
Chloride: 102 mmol/L (ref 98–111)
Creatinine, Ser: 0.86 mg/dL (ref 0.44–1.00)
GFR, Estimated: >60 mL/min (ref >60)
Glucose, Bld: 97 mg/dL (ref 70–99)
Potassium: 4.5 mmol/L (ref 3.5–5.1)
Sodium: 138 mmol/L (ref 135–145)

## 2024-07-05 LAB — TROPONIN T, HIGH SENSITIVITY
Troponin T High Sensitivity: <15 ng/L (ref 0–19)
Troponin T High Sensitivity: <15 ng/L (ref 0–19)

## 2024-07-05 LAB — PRO BRAIN NATRIURETIC PEPTIDE: Pro Brain Natriuretic Peptide: 53.7 pg/mL (ref <300.0)

## 2024-07-05 MED ORDER — IPRATROPIUM-ALBUTEROL 0.5-2.5 (3) MG/3ML IN SOLN
3.0000 mL | Freq: Once | RESPIRATORY_TRACT | Status: AC
  Administered 2024-07-05: 3 mL via RESPIRATORY_TRACT
  Filled 2024-07-05: qty 3

## 2024-07-05 MED ORDER — ONDANSETRON 4 MG PO TBDP
4.0000 mg | ORAL_TABLET | Freq: Once | ORAL | Status: AC
  Administered 2024-07-05: 4 mg via ORAL
  Filled 2024-07-05: qty 1

## 2024-07-05 NOTE — Discharge Instructions (Signed)
 Your workup today was reassuring, your heart failure enzymes and heart enzyme checking for any damage to the heart muscle were both normal.  I suspect you had a mild asthma exacerbation related to the dancing and it being hot in the church.  I think it is okay to continue your home inhaler, follow-up closely with your primary care doctor.  Please return if you have significant worsening shortness of breath.

## 2024-07-05 NOTE — ED Triage Notes (Addendum)
 Patient BIB [facility]. Was dancing at church and said it got too hot then felt short of breath. History of CHF and asthma. Took her albuterol  inhaler. [facility] started a duoneb. Patient had slight relief after this. Has left sided chest pain, no radiation. Some nausea.

## 2024-07-05 NOTE — ED Provider Notes (Signed)
 [place] [facility] AT [facility] Provider Note   CSN: [id number] Arrival date & time: 07/05/24  9040     Patient presents with: Shortness of Breath   Meghan [name] is a 58 y.o. female with past medical history seen for asthma, pneumonia, she reports history of prediabetes, in her chart I do see diabetes documented, denies high blood pressure, high cholesterol, tobacco use who presents from church.  She reports that it was very hot, she was dancing, and she began feeling shortness of breath.  Took her albuterol  inhaler, EMS gave 1 DuoNeb PTA.  She endorses some slight relief after this.  She does endorse some left-sided chest pain with no radiation.  Some mild nausea.    Shortness of Breath      Prior to Admission medications   Medication Sig Start Date End Date Taking? Authorizing Provider  albuterol  (VENTOLIN  HFA) 108 (90 Base) MCG/ACT inhaler Inhale 2 puffs into the lungs every 6 (six) hours as needed for wheezing or shortness of breath.    [provider]  amitriptyline (ELAVIL) 10 MG tablet Take 10 mg by mouth at bedtime. 01/01/24   [provider]  bumetanide  (BUMEX ) 1 MG tablet Take 1 tablet (1 mg total) by mouth daily for 10 days. 12/25/22 07/01/24  Teresa Shelba SAUNDERS, NP  Cholecalciferol (VITAMIN D-3) 125 MCG (5000 UT) TABS Take 5,000 Units by mouth daily.    [provider]  MOUNJARO 12.5 MG/0.5ML Pen Inject 12.5 mg into the skin once a week. 06/01/24   [provider]  ofloxacin (FLOXIN) 0.3 % OTIC solution Place 5 drops into both ears 2 (two) times daily. 05/28/24   [provider]  Omega-3 Fatty Acids (FISH OIL) 1200 MG CAPS Take 1,200 mg by mouth daily with breakfast.    [provider]  oxyCODONE -acetaminophen  (PERCOCET) 7.5-325 MG tablet Take 1 tablet by mouth every 6 (six) hours as needed for severe pain (pain score 7-10) (Headaches). 06/30/24   [provider]  rivaroxaban  (XARELTO ) 20 MG  TABS tablet Take 20 mg by mouth at bedtime.     [provider]  sertraline  (ZOLOFT ) 50 MG tablet Take 50 mg by mouth in the morning. 01/01/21   [provider]  zolpidem  (AMBIEN  CR) 6.25 MG CR tablet Take 1 tablet (6.25 mg total) by mouth at bedtime. 12/25/23   Neda Jennet LABOR, MD    Allergies: Acetaminophen -codeine, Bee venom, Meloxicam, Tramadol, Codeine, Coconut (cocos nucifera), and Tomato    Review of Systems  Respiratory:  Positive for shortness of breath.   All other systems reviewed and are negative.   Updated Vital Signs BP (!) 121/50 (BP Location: Right Arm)   Pulse 81   Temp 97.8 F (36.6 C) (Oral)   Resp 14   Ht 5' 5 (1.651 m)   Wt (!) 137 kg   SpO2 98%   BMI 50.26 kg/m   Physical Exam Vitals and nursing note reviewed.  Constitutional:      General: She is not in acute distress.    Appearance: Normal appearance.  HENT:     Head: Normocephalic and atraumatic.  Eyes:     General:        Right eye: No discharge.        Left eye: No discharge.  Cardiovascular:     Rate and Rhythm: Normal rate and regular rhythm.     Heart sounds: No murmur heard.    No friction rub. No gallop.  Pulmonary:     Effort: Pulmonary effort is normal.     Breath sounds: Normal breath sounds.     Comments: Mild wheezing, scattered rhonchi in left lower lung fields, no wheezing on right.  No significant increased work of breathing, no respiratory distress. Abdominal:     General: Bowel sounds are normal.     Palpations: Abdomen is soft.  Skin:    General: Skin is warm and dry.     Capillary Refill: Capillary refill takes less than 2 seconds.  Neurological:     Mental Status: She is alert and oriented to person, place, and time.  Psychiatric:        Mood and Affect: Mood normal.        Behavior: Behavior normal.     (all labs ordered are listed, but only abnormal results are displayed) Labs Reviewed  BASIC METABOLIC PANEL WITH GFR - Abnormal; Notable for  the following components:      Result Value   Calcium 10.5 (*)    All other components within normal limits  CBC - Abnormal; Notable for the following components:   Hemoglobin 11.7 (*)    All other components within normal limits  PRO BRAIN NATRIURETIC PEPTIDE  TROPONIN T, HIGH SENSITIVITY  TROPONIN T, HIGH SENSITIVITY    EKG: None  Radiology: DG Chest 2 View Result Date: 07/05/2024 CLINICAL DATA:  Shortness of breath and left-sided chest pain. EXAM: CHEST - 2 VIEW COMPARISON:  03/09/2024 FINDINGS: Lungs are somewhat hypoinflated as a lordotic technique is demonstrated on the frontal image. No evidence of focal lobar consolidation or effusion. Minimal prominence of the central pulmonary vasculature likely due to the degree of hypoinflation. Borderline stable cardiomegaly. Remainder of the exam is unchanged. IMPRESSION: 1. Hypoinflation without acute cardiopulmonary disease. 2. Borderline stable cardiomegaly. Electronically Signed   By: Toribio Agreste M.D.   On: 07/05/2024 11:12     Procedures   Medications Ordered in the ED  ipratropium-albuterol  (DUONEB) 0.5-2.5 (3) MG/3ML nebulizer solution 3 mL (3 mLs Nebulization Given 07/05/24 1057)  ondansetron  (ZOFRAN -ODT) disintegrating tablet 4 mg (4 mg Oral Given 07/05/24 1040)                                    Medical Decision Making Amount and/or Complexity of Data Reviewed Labs: ordered. Radiology: ordered.  Risk Prescription drug management.   This patient is a 58 y.o. female  who presents to the ED for concern of chest pain, shortness of breath.   Differential diagnoses prior to evaluation: The emergent differential diagnosis includes, but is not limited to,  ACS, AAS, PE, Mallory-Weiss, Boerhaave's, Pneumonia, acute bronchitis, asthma or COPD exacerbation, anxiety, MSK pain or traumatic injury to the chest, acid reflux versus other . This is not an exhaustive differential.   Past Medical History / Co-morbidities / Social  History: asthma, pneumonia, she reports history of prediabetes, in her chart I do see diabetes documented, denies high blood pressure, high cholesterol, tobacco use   Additional history: Chart reviewed. Pertinent results include: Reviewed lab work, imaging from previous emergency department visits  Physical Exam: Physical exam performed. The pertinent findings include: Some mild left-sided wheezing especially on expiration on arrival, improved after repeat DuoNeb.  Vital signs have been stable throughout ED evaluation, some mild tachypnea on initial arrival is improved on reevaluation.  Stable oxygen saturation on room air.  Lab Tests/Imaging studies: I personally interpreted labs/imaging  and the pertinent results include: CBC with mild anemia, hemoglobin 11.7, no leukocytosis, BMP overall unremarkable, very mild hypercalcemia, calcium 10.5.  Plain film chest x-ray with no evidence of acute intrathoracic abnormality, stable cardiomegaly. BNP normal range.  Troponin negative x 2.  I agree with the radiologist interpretation.  Cardiac monitoring: EKG obtained and interpreted by myself and attending physician which shows: Normal sinus rhythm, no acute ST changes, prolonged PR   Medications: I ordered medication including DuoNeb, Zofran  for wheezing, nausea.  I have reviewed the patients home medicines and have made adjustments as needed.   Disposition: After consideration of the diagnostic results and the patients response to treatment, I feel that on reevaluation wheezing improved, patient feeling improved, offered short burst of steroid for mild asthma exacerbation, patient declines reporting that she does not want to experience the weight gain, increase blood pressure, sugar associated with steroid use, given her symptoms were fairly mild and totally resolved at this time I think this is reasonable.  Discussed extensive return precautions.   emergency department workup does not suggest an  emergent condition requiring admission or immediate intervention beyond what has been performed at this time. The plan is: as above. The patient is safe for discharge and has been instructed to return immediately for worsening symptoms, change in symptoms or any other concerns.   Final diagnoses:  Mild intermittent asthma with exacerbation    ED Discharge Orders     None          Rosan Sherlean DEL, PA-C 07/05/24 1446    Rogelia Jerilynn RAMAN, MD 07/09/24 [phone]

## 2024-07-06 ENCOUNTER — Telehealth: Payer: Self-pay

## 2024-07-06 ENCOUNTER — Ambulatory Visit (INDEPENDENT_AMBULATORY_CARE_PROVIDER_SITE_OTHER): Admitting: Licensed Clinical Social Worker

## 2024-07-06 DIAGNOSIS — F331 Major depressive disorder, recurrent, moderate: Secondary | ICD-10-CM

## 2024-07-06 DIAGNOSIS — F411 Generalized anxiety disorder: Secondary | ICD-10-CM

## 2024-07-06 DIAGNOSIS — F4310 Post-traumatic stress disorder, unspecified: Secondary | ICD-10-CM | POA: Diagnosis not present

## 2024-07-06 LAB — FACTOR 5 LEIDEN

## 2024-07-06 LAB — PROTHROMBIN GENE MUTATION

## 2024-07-06 NOTE — Telephone Encounter (Signed)
 [facility] Clinical Social Work  Clinical Social Work was referred by Engineer, civil (consulting) for need for [employer].  Clinical Social Worker contacted patient by phone to offer support and assess for needs. Patient utilizes [facility] services to get to her medical appointments. However, patient expressed frustration with missing or being late to her appointments. Patient inquired about other options available. However, patient is utilizing all services known to [facility] CSW. No other needs at this time.    Lizbeth Sprague, LCSW  Clinical Social Worker [facility]

## 2024-07-06 NOTE — Progress Notes (Signed)
 Virtual Visit via Video Note   I connected with Meghan [name] on 07/06/24 at 1:00pm by video enabled telemedicine application and verified that I am speaking with the correct person using two identifiers.   I discussed the limitations, risks, security and privacy concerns of performing an evaluation and management service by video and the availability of in person appointments. I also discussed with the patient that there may be a patient responsible charge related to this service. The patient expressed understanding and agreed to proceed.   I discussed the assessment and treatment plan with the patient. The patient was provided an opportunity to ask questions and all were answered. The patient agreed with the plan and demonstrated an understanding of the instructions.   The patient was advised to call back or seek an in-person evaluation if the symptoms worsen or if the condition fails to improve as anticipated.   I provided 37 minutes of non-face-to-face time during this encounter.   [name], LCSW, LCAS _______________________ THERAPIST PROGRESS NOTE   Session Time:  1:00pm - 1:37pm    Location: Patient: Patient home Provider: Home Office   Participation Level: Active    Behavioral Response: Alert, casually dressed, euthymic mood/affect   Type of Therapy:  Individual Therapy   Treatment Goals addressed: Mood management; Medication management; Attending medical appointments    Progress Towards Goals: Progressing   Interventions: CBT: challenging anxious thoughts    Summary: Meghan [name] is a 58 year old divorced African American female that presented for therapy appointment today with diagnoses of Major Depressive Disorder, recurrent, moderate, Generalized Anxiety Disorder and PTSD.      Suicidal/Homicidal: None; without plan or intent                                                                                                                Therapist Response:   Clinician met with Meghan [name] for virtual therapy appointment and assessed for safety, sobriety, and medication compliance.  Meghan [name] presented for session on time and was alert, oriented x5, with no evidence or self-report of active SI/HI or A/V H.  Meghan [name] reported ongoing compliance with medication and denied any use of alcohol or illicit substances.  Clinician inquired about Meghan [name]'s current emotional ratings, as well as any significant changes in thoughts, feelings, or behavior since previous check-in.  Meghan [name] reported scores of 0/10 for depression, 0/10 for anxiety, and 0/10 for anger/irritability.  Meghan [name] denied any recent panic attacks or outbursts.  Meghan [name] reported that a recent struggle was having to cancel our appointment last week due to finding out that a family member had been admitted to the hospital nearby.  Meghan [name] reported that since then, they have stabilized, but Meghan [name] has also had some health challenges of concern, including an asthma attack while at church, and having to schedule a biopsy due to potential issues with her bladder.  Meghan [name] reported that she has been worried since the doctor notified her of bladder cancer risks.  Clinician utilized handout in session today titled Worry exploration  in order to assist Meghan [name] in reducing her anxiety related to upcoming medical procedure. This worksheet featured a series of Socratic questions aimed at exploring the most likely outcomes for a situation of concern, rather than focusing on the worst possible outcome (i.e. catastrophizing).  Clinician assisted Meghan [name] in identifying and challenging any irrational beliefs related to this worry, in addition to utilizing problem solving approach to explore strategies which would help her not only attend appointment, but reduce risk of major health complications.  Meghan [name] actively participated in discussion on handout, reporting that she is worried that she will develop bladder cancer after the doctor mentioned this.  Meghan [name]  reported that there is little evidenced to support this outcome at the moment since there hasn't been enough testing yet, and acknowledged that she would benefit from distracting herself with self-care activities before the appointment next [facility].  She reported that she has also noticed that she has headaches when stressed, and this has been increasingly more prevalent since family was living with her temporarily.  Intervention was effective, as evidenced by Meghan [name] reporting that discussion on this subject reduced her anxiety about this medical issues, and motivated her to distract herself with church activities coming up this week instead.  She reported that she also has several supports she can talk to about worries like this, and they are also able to ground her when panic arises.  Meghan [name] asked to end session early due to a dentist appointment, and issues with bandwidth as she began to travel to that office. Clinician will continue to monitor.          Plan: Follow up again in 1-2 weeks.    Diagnosis: Major depressive disorder, recurrent, moderate; Generalized Anxiety Disorder; and PTSD    Collaboration of Care:   No collaboration of care required for this visit.                                                   Patient/Guardian was advised Release of Information must be obtained prior to any record release in order to collaborate their care with an outside provider. Patient/Guardian was advised if they have not already done so to contact the registration department to sign all necessary forms in order for us  to release information regarding their care.    Consent: Patient/Guardian gives verbal consent for treatment and assignment of benefits for services provided during this visit. Patient/Guardian expressed understanding and agreed to proceed.   [name], LCSW, LCAS 07/06/24

## 2024-07-10 ENCOUNTER — Ambulatory Visit

## 2024-07-10 ENCOUNTER — Ambulatory Visit: Admitting: Podiatry

## 2024-07-10 DIAGNOSIS — M1380 Other specified arthritis, unspecified site: Secondary | ICD-10-CM

## 2024-07-10 DIAGNOSIS — M65972 Unspecified synovitis and tenosynovitis, left ankle and foot: Secondary | ICD-10-CM

## 2024-07-10 NOTE — Progress Notes (Signed)
 Subjective:  Patient ID: Meghan [name], female    DOB: [birth year],  MRN: [id number]  Chief Complaint  Patient presents with   Arthritis    58 y.o. female presents with the above complaint.  Patient presents with left ankle capsulitis.  She states it continues to cause her a lot of discomfort.  She still hurts with ambulation or shoe pressure steroid injection cam boot immobilization seeing rheumatologist none of which has helped.  She wishes to be further advanced imaging of the left ankle pain scale 7 out of 10 dull aching nature hurts with ambulation or shoe pressure   Review of Systems: Negative except as noted in the HPI. Denies N/V/F/Ch.  Past Medical History:  Diagnosis Date   (HFpEF) heart failure with preserved ejection fraction (HCC) 01/2019   a.) Dx'd in [place]; b.) TTE 08/14/2022: EF 60-65%, mod LVH, mild LAE, triv MR   Anxiety    Asthma 05/69   Atrial fibrillation and flutter (HCC) 02/16/2019   a.) CHA2DS2VASc = 6 (sex, HFpEF, HTN, CVA x2, T2DM);  b.) s/p DCCV (200 J x 1) 08/22/2022; c.) s/p RF ablation (CTI) 11/02/2022; d.) rate/rhythm maintained on oral flecainide  + metoprolol  succinate; chronically anticoagulated with rivaroxaban    CHF (congestive heart failure) (HCC) 01/2019   Class 3 severe obesity due to excess calories with body mass index (BMI) of 50.0 to 59.9 in adult [facility])    DDD (degenerative disc disease), lumbar    GERD (gastroesophageal reflux disease)    Heart murmur 07/2017   Hiatal hernia    History of head injury    HTN (hypertension)    Hypercholesterolemia    Ileus (HCC)    Intractable nausea and vomiting 11/16/2020   Long term current use of anticoagulant    a.) rivaroxaban    Macular degeneration of left eye    MDD (major depressive disorder)    OA (osteoarthritis)    OSA on CPAP    Pneumonia    Presence of IVC filter    PTSD (post-traumatic stress disorder)    Recurrent pulmonary emboli (HCC)    a.) s/p IVC filter placement 2003;  b.) previously followed by hematology when living in [place]   Seizure disorder (HCC)    a.) last was in 2020 per pt report   Stroke (HCC) 02/1995   1989 and 1995 (left sided weakness)   T2DM (type 2 diabetes mellitus) (HCC)    Vitamin D deficiency     Current Outpatient Medications:    albuterol  (VENTOLIN  HFA) 108 (90 Base) MCG/ACT inhaler, Inhale 2 puffs into the lungs every 6 (six) hours as needed for wheezing or shortness of breath., Disp: , Rfl:    amitriptyline (ELAVIL) 10 MG tablet, Take 10 mg by mouth at bedtime., Disp: , Rfl:    bumetanide  (BUMEX ) 1 MG tablet, Take 1 tablet (1 mg total) by mouth daily for 10 days., Disp: 10 tablet, Rfl: 0   Cholecalciferol (VITAMIN D-3) 125 MCG (5000 UT) TABS, Take 5,000 Units by mouth daily., Disp: , Rfl:    MOUNJARO 12.5 MG/0.5ML Pen, Inject 12.5 mg into the skin once a week., Disp: , Rfl:    ofloxacin (FLOXIN) 0.3 % OTIC solution, Place 5 drops into both ears 2 (two) times daily., Disp: , Rfl:    Omega-3 Fatty Acids (FISH OIL) 1200 MG CAPS, Take 1,200 mg by mouth daily with breakfast., Disp: , Rfl:    oxyCODONE -acetaminophen  (PERCOCET) 7.5-325 MG tablet, Take 1 tablet by mouth every 6 (six)  hours as needed for severe pain (pain score 7-10) (Headaches)., Disp: , Rfl:    rivaroxaban  (XARELTO ) 20 MG TABS tablet, Take 20 mg by mouth at bedtime. , Disp: , Rfl:    sertraline  (ZOLOFT ) 50 MG tablet, Take 50 mg by mouth in the morning., Disp: , Rfl:    zolpidem  (AMBIEN  CR) 6.25 MG CR tablet, Take 1 tablet (6.25 mg total) by mouth at bedtime., Disp: 30 tablet, Rfl: 2  Social History   Tobacco Use  Smoking Status Never   Passive exposure: Never  Smokeless Tobacco Never    Allergies  Allergen Reactions   Acetaminophen -Codeine Swelling, Rash and Other (See Comments)    Tylenol  with Codeine, Tylenol  #3,  (facial swelling, hives)   Bee Venom Anaphylaxis   Meloxicam Anaphylaxis and Rash   Tramadol Hives and Other (See Comments)    Patient  stated I was trippin' and I do not want that ever again   Codeine Hives and Swelling   Coconut (Cocos Nucifera) Rash and Other (See Comments)    ANY coconut products    Tomato Rash   Objective:  There were no vitals filed for this visit. There is no height or weight on file to calculate BMI. Constitutional Well developed. Well nourished.  Vascular Dorsalis pedis pulses palpable bilaterally. Posterior tibial pulses palpable bilaterally. Capillary refill normal to all digits.  No cyanosis or clubbing noted. Pedal hair growth normal.  Neurologic Normal speech. Oriented to person, place, and time. Epicritic sensation to light touch grossly present bilaterally.  Dermatologic Nails well groomed and normal in appearance. No open wounds. No skin lesions.  Orthopedic: Pain on palpation to the left ankle pain with range of motion of the ankle joint deep intra-articular ankle pain noted.  No pain at the posterior tibial peroneal ATFL ligament no pain at the Achilles tendon   Radiographs: Pes planovalgus 3 views of skeletally mature adult left ankle: 3 views of skeletally mature the left ankle: No osteoarthritis noted mild midfoot changes noted.  Pes planovalgus noted.  No other bony abnormalities identified. Assessment:   1. Inflammatory arthritis   2. Synovitis of left ankle    Plan:  Patient was evaluated and treated and all questions answered.  Left ankle synovitis - All questions and concerns were discussed with the patient in extensive detail given the amount of pain that she is having should benefit from cam boot immobilization Cam boot was dispensed.  She has lost the previous 1. - She will also benefit from MRI to assess the soft tissue structure along the ankle.  She agrees with plan like to proceed with MRI - MRI was ordered  No follow-ups on file.

## 2024-07-13 ENCOUNTER — Ambulatory Visit (INDEPENDENT_AMBULATORY_CARE_PROVIDER_SITE_OTHER): Admitting: Licensed Clinical Social Worker

## 2024-07-13 ENCOUNTER — Ambulatory Visit

## 2024-07-13 DIAGNOSIS — F4310 Post-traumatic stress disorder, unspecified: Secondary | ICD-10-CM

## 2024-07-13 DIAGNOSIS — F411 Generalized anxiety disorder: Secondary | ICD-10-CM

## 2024-07-13 DIAGNOSIS — F331 Major depressive disorder, recurrent, moderate: Secondary | ICD-10-CM

## 2024-07-13 NOTE — Progress Notes (Signed)
 Virtual Visit via Video Note   I connected with Sincere S. Gaymon on 07/13/24 at 3:00pm by video enabled telemedicine application and verified that I am speaking with the correct person using two identifiers.   I discussed the limitations, risks, security and privacy concerns of performing an evaluation and management service by video and the availability of in person appointments. I also discussed with the patient that there may be a patient responsible charge related to this service. The patient expressed understanding and agreed to proceed.   I discussed the assessment and treatment plan with the patient. The patient was provided an opportunity to ask questions and all were answered. The patient agreed with the plan and demonstrated an understanding of the instructions.   The patient was advised to call back or seek an in-person evaluation if the symptoms worsen or if the condition fails to improve as anticipated.   I provided 31 minutes of non-face-to-face time during this encounter.   [name], LCSW, LCAS _______________________ THERAPIST PROGRESS NOTE   Session Time:  3:00pm - 3:31pm    Location: Patient: Patient home Provider: Home Office   Participation Level: Active    Behavioral Response: Alert, casually dressed, euthymic mood/affect   Type of Therapy:  Individual Therapy   Treatment Goals addressed: Mood management; Medication management; Attending medical appointments; Maintaining employment; Maintaining exercise regimen; Pursuing academic achievement; Following safety plan   Progress Towards Goals: Progressing   Interventions: CBT, treatment planning    Summary: Mairlyn S. Butrum is a 58 year old divorced African American female that presented for therapy appointment today with diagnoses of Major Depressive Disorder, recurrent, moderate, Generalized Anxiety Disorder and PTSD.      Suicidal/Homicidal: None; without plan or intent                                                                                                                 Therapist Response:  Clinician met with Sallyanne for virtual therapy session and assessed for safety, sobriety, and medication compliance.  Titiana presented for appointment on time and was alert, oriented x5, with no evidence or self-report of active SI/HI or A/V H.  Allayah reported ongoing compliance with medication and denied any use of alcohol or illicit substances.  Clinician inquired about Temara's emotional ratings today, as well as any significant changes in thoughts, feelings, or behavior since last check-in.  Dhamar reported scores of 0/10 for depression, 0/10 for anxiety, and 0/10 for anger/irritability.  Milla denied any recent panic attacks or outbursts.  Janelys reported that a recent success was picking up a shift at her part-time job today, although this left her feeling very tired since she slept poorly last night.  Kimmie denied having any pressing issues to target today in therapy, so clinician recommended revisiting treatment plan, which she was agreeable to.  Clinician collaborated with [name] to make updates to treatment plan as follows with her verbal consent: Meet with clinician every 2 weeks for therapy sessions to update on goal progress and address any  needs that arise; Follow up with psychiatrist once every 3 months regarding efficacy of medication and any dose modification necessary; Take medications daily as prescribed to aid in symptom reduction and improvement of daily functioning; Maintain depression at low severity level (0-1/10) over next 90 days by attending regular therapy sessions, and engaging in healthy self-care activities daily to keep mind engaged such as reading, going for walks in the park, and speaking with family x3 weekly for support; Maintain anxiety at low severity level (0-1/10) over next 90 days by practicing relaxation techniques with proven efficacy 2-3x daily, in addition to challenging anxious thoughts that  arise to negate negative impact on outlook; Exercise for at least x3 per week, 30 minutes per water aerobics class in addition to following heart-healthy diet to improve both physical and mental well-being per PCP recommendations; Attend church meetings with clergy members 2x per week to stay productive, engaged with supportive community, and maintain spiritual support, in addition to any other available activities such as choir once per week/once per month; Maintain healthier boundaries with all supports to avoid worsening anxiety and depression, as well as enforce assertive communication skills, with goal of limiting contact with toxic supports until more respectful communication patterns are established; Maintain average self-esteem at high level (around 8-10/10) over next 3 months by continuing practice of daily positive affirmations, challenging negative core beliefs related to self-image, and asserting needs more readily when interpersonal issues arise; Continue to work 5 hours per week at [employer] caring for children x1 per week in order to make extra money, preoccupy idle time and contribute to sense of purpose; Begin making arrangements (buy new laptop, supplies, etc) to begin school in November 2025 in order to earn certification to work with [employer] as an alternative to current job due to reduced hours; Voluntarily seek admission to hospital for crisis intervention should SI/HI or A/V H appear and put safety of self or others at risk.  Progress is evidenced by [name] reporting that due to increased stability and low depression and anxiety levels, she will start meeting biweekly.  Elpidia reported that she has been consistent with medication and meeting every 3 months with psychiatrist.  She reported that she is exercising 15 minutes less when she goes to the gym, but is still overcoming fear of having a stroke again due to past incident in the [facility].  She reported that she is staying consistency with church  attendance for support, and maintaining healthier boundaries with family, including her daughter and grandson that recently moved into their own place and out of her apartment.  Nivia reported that hours have decreased at her job due to being shifted to a substitute position, but she will be taking a course through the [employer] to explore new career opportunities and income.  Rylea reported that she will continue following safety plan.  Mickala asked to end session after 30 minutes due to fatigue from lack of sleep last night.  Clinician permitted this, and will continue to monitor.          Plan: Follow up again in 1-2 weeks.    Diagnosis: Major depressive disorder, recurrent, moderate; Generalized Anxiety Disorder; and PTSD    Collaboration of Care:   No collaboration of care required for this visit.  Patient/Guardian was advised Release of Information must be obtained prior to any record release in order to collaborate their care with an outside provider. Patient/Guardian was advised if they have not already done so to contact the registration department to sign all necessary forms in order for us  to release information regarding their care.    Consent: Patient/Guardian gives verbal consent for treatment and assignment of benefits for services provided during this visit. Patient/Guardian expressed understanding and agreed to proceed.   [name], LCSW, LCAS 07/13/24

## 2024-07-14 ENCOUNTER — Inpatient Hospital Stay

## 2024-07-14 ENCOUNTER — Other Ambulatory Visit: Payer: Self-pay

## 2024-07-14 DIAGNOSIS — Z86711 Personal history of pulmonary embolism: Secondary | ICD-10-CM

## 2024-07-14 LAB — CMP (CANCER CENTER ONLY)
ALT: 11 U/L (ref 0–44)
AST: 13 U/L — ABNORMAL LOW (ref 15–41)
Albumin: 3.9 g/dL (ref 3.5–5.0)
Alkaline Phosphatase: 155 U/L — ABNORMAL HIGH (ref 38–126)
Anion gap: 4 — ABNORMAL LOW (ref 5–15)
BUN: 8 mg/dL (ref 6–20)
CO2: 31 mmol/L (ref 22–32)
Calcium: 10.3 mg/dL (ref 8.9–10.3)
Chloride: 106 mmol/L (ref 98–111)
Creatinine: 0.8 mg/dL (ref 0.44–1.00)
GFR, Estimated: >60 mL/min (ref >60)
Glucose, Bld: 87 mg/dL (ref 70–99)
Potassium: 3.8 mmol/L (ref 3.5–5.1)
Sodium: 141 mmol/L (ref 135–145)
Total Bilirubin: 0.3 mg/dL (ref 0.0–1.2)
Total Protein: 7.4 g/dL (ref 6.5–8.1)

## 2024-07-14 LAB — CBC WITH DIFFERENTIAL (CANCER CENTER ONLY)
Abs Immature Granulocytes: 0.01 K/uL (ref 0.00–0.07)
Basophils Absolute: 0 K/uL (ref 0.0–0.1)
Basophils Relative: 1 %
Eosinophils Absolute: 0.1 K/uL (ref 0.0–0.5)
Eosinophils Relative: 2 %
HCT: 36.1 % (ref 36.0–46.0)
Hemoglobin: 11.9 g/dL — ABNORMAL LOW (ref 12.0–15.0)
Immature Granulocytes: 0 %
Lymphocytes Relative: 30 %
Lymphs Abs: 1.8 K/uL (ref 0.7–4.0)
MCH: 27.4 pg (ref 26.0–34.0)
MCHC: 33 g/dL (ref 30.0–36.0)
MCV: 83.2 fL (ref 80.0–100.0)
Monocytes Absolute: 0.5 K/uL (ref 0.1–1.0)
Monocytes Relative: 8 %
Neutro Abs: 3.5 K/uL (ref 1.7–7.7)
Neutrophils Relative %: 59 %
Platelet Count: 238 K/uL (ref 150–400)
RBC: 4.34 MIL/uL (ref 3.87–5.11)
RDW: 14.4 % (ref 11.5–15.5)
WBC Count: 5.9 K/uL (ref 4.0–10.5)
nRBC: 0 % (ref 0.0–0.2)

## 2024-07-14 NOTE — Progress Notes (Signed)
 Lab called stating pt did not have labs on file.  Dr. Pasam at [facility] today.  Consulted with Dr. Lanny and verbal order for CMP & CBC w/Diff given.

## 2024-07-15 ENCOUNTER — Other Ambulatory Visit: Payer: Self-pay

## 2024-07-15 ENCOUNTER — Encounter: Payer: Self-pay | Admitting: Oncology

## 2024-07-15 ENCOUNTER — Inpatient Hospital Stay (HOSPITAL_BASED_OUTPATIENT_CLINIC_OR_DEPARTMENT_OTHER): Admitting: Oncology

## 2024-07-15 ENCOUNTER — Ambulatory Visit: Attending: Vascular Surgery | Admitting: Vascular Surgery

## 2024-07-15 ENCOUNTER — Encounter: Payer: Self-pay | Admitting: Vascular Surgery

## 2024-07-15 VITALS — BP 113/76 | HR 67 | Temp 97.9°F | Ht 65.0 in | Wt 305.0 lb

## 2024-07-15 DIAGNOSIS — Z86711 Personal history of pulmonary embolism: Secondary | ICD-10-CM | POA: Diagnosis not present

## 2024-07-15 DIAGNOSIS — M316 Other giant cell arteritis: Secondary | ICD-10-CM

## 2024-07-15 NOTE — Progress Notes (Signed)
 Patient ID: Meghan [name], female   DOB: May 01, 1966, 58 y.o.   MRN: [id number]  Reason for Consult: Follow-up   Referred by Health, [address]  Subjective:     HPI:  Meghan [name] is a 58 y.o. female history of left lower extremity venous intervention for chronic venous insufficiency.  She now has multiple months of left sided headache with visual changes, jaw claudication and pain over her temporal artery.  She does have elevated inflammatory markers and is here today to discuss possible temporal artery biopsy  Past Medical History:  Diagnosis Date   (HFpEF) heart failure with preserved ejection fraction (HCC) 01/2019   a.) Dx'd in [place] , [place]; b.) TTE 08/14/2022: EF 60-65%, mod LVH, mild LAE, triv MR   Anxiety    Asthma 05/69   Atrial fibrillation and flutter (HCC) 02/16/2019   a.) CHA2DS2VASc = 6 (sex, HFpEF, HTN, CVA x2, T2DM);  b.) s/p DCCV (200 J x 1) 08/22/2022; c.) s/p RF ablation (CTI) 11/02/2022; d.) rate/rhythm maintained on oral flecainide  + metoprolol  succinate; chronically anticoagulated with rivaroxaban    CHF (congestive heart failure) (HCC) 01/2019   Class 3 severe obesity due to excess calories with body mass index (BMI) of 50.0 to 59.9 in adult [facility])    DDD (degenerative disc disease), lumbar    GERD (gastroesophageal reflux disease)    Heart murmur 07/2017   Hiatal hernia    History of head injury    HTN (hypertension)    Hypercholesterolemia    Ileus (HCC)    Intractable nausea and vomiting 11/16/2020   Long term current use of anticoagulant    a.) rivaroxaban    Macular degeneration of left eye    MDD (major depressive disorder)    OA (osteoarthritis)    OSA on CPAP    Pneumonia    Presence of IVC filter    PTSD (post-traumatic stress disorder)    Recurrent pulmonary emboli (HCC)    a.) s/p IVC filter placement 2003; b.) previously followed by hematology when living in [place] , [place]   Seizure disorder (HCC)    a.) last was in 2020 per pt report    Stroke (HCC) 02/1995   1989 and 1995 (left sided weakness)   T2DM (type 2 diabetes mellitus) (HCC)    Vitamin D deficiency    Family History  Problem Relation Age of Onset   Breast cancer Mother    Heart attack Father    Diabetes Father    Congestive Heart Failure Father    Clotting disorder Father    Heart disease Father    Heart failure Father    Hypertension Father    Heart failure Brother    Breast cancer Maternal Grandmother    Arrhythmia Paternal Grandmother    Past Surgical History:  Procedure Laterality Date   A-FLUTTER ABLATION N/A 11/02/2022   Procedure: A-FLUTTER ABLATION;  Surgeon: Mealor, Eulas BRAVO, MD;  Location: [facility];  Service: Cardiovascular;  Laterality: N/A;   ARTHRODESIS METATARSALPHALANGEAL JOINT (MTPJ) Left 02/25/2023   Procedure: ARTHRODESIS METATARSALPHALANGEAL JOINT (MTPJ);  Surgeon: Tobie Franky SQUIBB, DPM;  Location: [facility];  Service: Podiatry;  Laterality: Left;   CARDIAC CATHETERIZATION  2017   in [facility] in [place]   CARDIOVERSION N/A 08/22/2022   Procedure: CARDIOVERSION;  Surgeon: Sheena Pugh, DO;  Location: [facility];  Service: Cardiovascular;  Laterality: N/A;   CESAREAN SECTION N/A 1989   CESAREAN SECTION N/A 1993   CHOLECYSTECTOMY  2003   COLONOSCOPY  WITH PROPOFOL  N/A 06/05/2019   Procedure: COLONOSCOPY WITH PROPOFOL ;  Surgeon: Rollin Dover, MD;  Location: [facility];  Service: Endoscopy;  Laterality: N/A;   CYST REMOVAL WITH BONE GRAFT Left 10/18/2020   Procedure: BONE GRAFTING OF ENCHONDROMA MIDDLE PHANLANX OF LEFT MIDDLE FINGER;  Surgeon: Murrell Kuba, MD;  Location: [facility];  Service: Orthopedics;  Laterality: Left;  AXILLARY BLOCK   DIAGNOSTIC LAPAROSCOPY  2015; 2017   lap hernia repair x2   ENDOVENOUS ABLATION SAPHENOUS VEIN W/ LASER Left 08/31/2021   endovenous laser ablation left greater saphenous vein and stab phlebectomy 10-20 incisions left leg by Penne Colorado MD   HALLUX FUSION Right 05/28/2022    Procedure: HALLUX FUSION METATARSAL PHALANGEAL JOIT;  Surgeon: Tobie Franky SQUIBB, DPM;  Location: [facility];  Service: Podiatry;  Laterality: Right;  BLOCK   HERNIA REPAIR  2014, 2018   umbilical hernia repair   IVC FILTER INSERTION  2003   POLYPECTOMY  06/05/2019   Procedure: POLYPECTOMY;  Surgeon: Rollin Dover, MD;  Location: [facility];  Service: Endoscopy;;   SHOULDER ARTHROSCOPY W/ ROTATOR CUFF REPAIR Right 08/28/2017   TOTAL ABDOMINAL HYSTERECTOMY  2003   UPPER GI ENDOSCOPY     WRIST ARTHROSCOPY WITH DEBRIDEMENT Left 10/18/2020   Procedure: LEFT WRIST ARTHROSCOPY WITH DEBRIDEMENT;  Surgeon: Murrell Kuba, MD;  Location: [facility];  Service: Orthopedics;  Laterality: Left;  AXILLARY BLOCK   WRIST ARTHROSCOPY WITH DEBRIDEMENT Right 01/25/2022   Procedure: ARTHROSCOPY RIGHT WRIST WITH  DEBRIDEMENT/ SHRINKAGE;  Surgeon: Murrell Franky, MD;  Location: [facility];  Service: Orthopedics;  Laterality: Right;    Short Social History:  Social History   Tobacco Use   Smoking status: Never    Passive exposure: Never   Smokeless tobacco: Never  Substance Use Topics   Alcohol use: Never    Allergies  Allergen Reactions   Acetaminophen -Codeine Swelling, Rash and Other (See Comments)    Tylenol  with Codeine, Tylenol  #3,  (facial swelling, hives)   Bee Venom Anaphylaxis   Meloxicam Anaphylaxis and Rash   Tramadol Hives and Other (See Comments)    Patient stated I was trippin' and I do not want that ever again   Codeine Hives and Swelling   Coconut (Cocos Nucifera) Rash and Other (See Comments)    ANY coconut products    Tomato Rash    Current Outpatient Medications  Medication Sig Dispense Refill   albuterol  (VENTOLIN  HFA) 108 (90 Base) MCG/ACT inhaler Inhale 2 puffs into the lungs every 6 (six) hours as needed for wheezing or shortness of breath.     amitriptyline (ELAVIL) 10 MG tablet Take 10 mg by mouth at bedtime.     bumetanide  (BUMEX ) 1 MG tablet Take 1 tablet (1 mg  total) by mouth daily for 10 days. 10 tablet 0   Cholecalciferol (VITAMIN D-3) 125 MCG (5000 UT) TABS Take 5,000 Units by mouth daily.     MOUNJARO 12.5 MG/0.5ML Pen Inject 12.5 mg into the skin once a week.     ofloxacin (FLOXIN) 0.3 % OTIC solution Place 5 drops into both ears 2 (two) times daily.     Omega-3 Fatty Acids (FISH OIL) 1200 MG CAPS Take 1,200 mg by mouth daily with breakfast.     oxyCODONE -acetaminophen  (PERCOCET) 7.5-325 MG tablet Take 1 tablet by mouth every 6 (six) hours as needed for severe pain (pain score 7-10) (Headaches).     rivaroxaban  (XARELTO ) 20 MG TABS tablet Take 20 mg by mouth at bedtime.  sertraline  (ZOLOFT ) 50 MG tablet Take 50 mg by mouth in the morning.     zolpidem  (AMBIEN  CR) 6.25 MG CR tablet Take 1 tablet (6.25 mg total) by mouth at bedtime. 30 tablet 2   No current facility-administered medications for this visit.    Review of Systems  Constitutional:  Constitutional negative. HENT: HENT negative.  Eyes: Positive for visual disturbance.   Respiratory: Respiratory negative.  Cardiovascular: Cardiovascular negative.  GI: Gastrointestinal negative.  Skin: Skin negative.  Neurological: Positive for headaches.  Hematologic: Hematologic/lymphatic negative.  Psychiatric: Psychiatric negative.        Objective:  Objective   Vitals:   07/15/24 1143  BP: 113/76  Pulse: 67  Temp: 97.9 F (36.6 C)  SpO2: 93%  Weight: (!) 305 lb (138.3 kg)  Height: 5' 5 (1.651 m)   Body mass index is 50.75 kg/m.  Physical Exam HENT:     Head: Normocephalic.     Comments: Pain to palpation over palpable left temporal artery    Nose: Nose normal.     Mouth/Throat:     Mouth: Mucous membranes are moist.  Cardiovascular:     Rate and Rhythm: Normal rate.  Pulmonary:     Effort: Pulmonary effort is normal.  Abdominal:     General: Abdomen is flat.  Musculoskeletal:     Cervical back: Normal range of motion and neck supple.  Neurological:     Mental  Status: She is alert.     Data:  ESR: 92   MRI HEAD WITHOUT AND WITH CONTRAST   TECHNIQUE: Multiplanar, multiecho pulse sequences of the brain and surrounding structures were obtained without and with intravenous contrast.   CONTRAST:  10mL GADAVIST  GADOBUTROL  1 MMOL/ML IV SOLN   COMPARISON:  September 25, 2023   FINDINGS: MRI brain:   The brain volume is normal.   The signal in the brain parenchyma is normal.   No abnormal enhancement.   There is no acute or chronic infarct.   The ventricles are normal.   No mass lesion.   There are normal flow signals in the carotid arteries and basilar artery.   No significant bone marrow signal abnormality.   No significant abnormality in the paranasal sinuses or soft tissues.   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  We discussed the procedural details as well as risks and benefits of proceeding with left temporal artery biopsy.  We will schedule this in the near future.  She will need to hold Xarelto  48 hours prior to procedure.     Penne Lonni Colorado MD [facility]

## 2024-07-15 NOTE — Assessment & Plan Note (Signed)
 Two strokes, last in 2014, and recurrent pulmonary emboli, last significant event in 2003.   On lifelong anticoagulation with Xarelto .   IVC filter placed in 2003, non-removable.   No recent thromboembolic events. Intermittent leg swelling likely due to prolonged standing, without pain or redness.   Family history of clotting issues in father.   Given her history of recurrent thromboembolism and history of CVAs, we pursued thrombophilia workup on her consultation with us  on 07/01/2024.  Prothrombin gene mutation, factor V Leiden mutation, beta-2 glycoprotein antibodies, protein C activity, protein S activity, Antithrombin III activity were all unremarkable.  D-dimer was undetectable.  Anticardiolipin IgM was slightly increased at 16, indeterminate, likely from concurrent Xarelto  use.  IgG and IgA levels were within normal limits.   Regardless of these test results, she needs to remain on indefinite anticoagulation given her history of recurrent thromboembolism.   Continue Xarelto  for lifelong anticoagulation.   Since she previously had anemia, we checked iron studies and they were unremarkable.  Hemoglobin spontaneously improved to 11.9.  Since no additional hematological workup or intervention is planned, she can be discharged from our office for continued follow-up with her PCP.  Please reconsult us  as necessary.  Thank you for giving us  an opportunity to participate in her care.

## 2024-07-15 NOTE — Progress Notes (Signed)
 [facility]  HEMATOLOGY-ONCOLOGY ELECTRONIC VISIT PROGRESS NOTE  PATIENT NAME: Meghan [name]   MR#: [id number] DOB: October 20, 1965  DATE OF SERVICE: 07/15/2024  Patient Care Team: Health, [facility] as PCP - General Branch, Ronal BRAVO, MD (Inactive) as PCP - Cardiology (Cardiology) Mealor, Eulas BRAVO, MD as PCP - Electrophysiology (Cardiology) Center, [facility] [place], Vinie NOVAK, MD as Referring Physician (Cardiology) Catalina Bare, MD as Consulting Physician (Internal Medicine) Sheldon Standing, MD as Consulting Physician (General Surgery) Rollin Dover, MD as Consulting Physician (Gastroenterology) Georgina Elgin Isela DOUGLAS, MD as Referring Physician (Orthopedic Surgery) Elizbeth Leita Ruth, FNP (Inactive) as Nurse Practitioner (Family Medicine) Luba Stabs, DO as Consulting Physician (Internal Medicine)  I connected with the patient via telephone conference and verified that I am speaking with the correct person using two identifiers. The patient's location is at home and I am providing care from the [facility].  I discussed the limitations, risks, security and privacy concerns of performing an evaluation and management service by e-visits and the availability of in person appointments. I also discussed with the patient that there may be a patient responsible charge related to this service. The patient expressed understanding and agreed to proceed.   ASSESSMENT & PLAN:   Meghan [name] is a 58 y.o. lady with a past medical history of hypertension, dyslipidemia, CHF, OSA on CPAP, seizure disorder, CVAs, recurrent thromboembolism, status post IVC filter placement in 2003, history of A-fib status post ablation, was referred to our service for evaluation of underlying thrombophilia state.    History of pulmonary embolus (PE) Two strokes, last in 2014, and recurrent pulmonary emboli, last significant event in 2003.   On lifelong anticoagulation with Xarelto .    IVC filter placed in 2003, non-removable.   No recent thromboembolic events. Intermittent leg swelling likely due to prolonged standing, without pain or redness.   Family history of clotting issues in father.   Given her history of recurrent thromboembolism and history of CVAs, we pursued thrombophilia workup on her consultation with us  on 07/01/2024.  Prothrombin gene mutation, factor V Leiden mutation, beta-2 glycoprotein antibodies, protein C activity, protein S activity, Antithrombin III activity were all unremarkable.  D-dimer was undetectable.  Anticardiolipin IgM was slightly increased at 16, indeterminate, likely from concurrent Xarelto  use.  IgG and IgA levels were within normal limits.   Regardless of these test results, she needs to remain on indefinite anticoagulation given her history of recurrent thromboembolism.   Continue Xarelto  for lifelong anticoagulation.   Since she previously had anemia, we checked iron studies and they were unremarkable.  Hemoglobin spontaneously improved to 11.9.  Since no additional hematological workup or intervention is planned, she can be discharged from our office for continued follow-up with her PCP.  Please reconsult us  as necessary.  Thank you for giving us  an opportunity to participate in her care.  - Ensure regular follow-up with primary care physician, vascular surgeons, and cardiologist. - Maintain up-to-date screenings including colonoscopies and mammograms.   I discussed the assessment and treatment plan with the patient. The patient was provided an opportunity to ask questions and all were answered. The patient agreed with the plan and demonstrated an understanding of the instructions. The patient was advised to call back or seek an in-person evaluation if the symptoms worsen or if the condition fails to improve as anticipated.    I spent 12 minutes over the phone with the patient reviewing test results, discuss management and  coordination/planning of care.  [name] Patten, MD 07/15/2024 3:24 PM [facility] - A DEPT OF [facility] [address] [place] [address] Dept: [phone] Dept Fax: [phone]   INTERVAL HISTORY:  Please see above for problem oriented charting.  The purpose of today's discussion is to explain recent lab results and to formulate plan of care.  Discussed the use of AI scribe software for clinical note transcription with the patient, who gave verbal consent to proceed.  History of Present Illness Meghan [name] is a 58 year old female with recurrent blood clots who presents for follow-up after a hematological workup. She was referred for evaluation of her recurrent blood clot issues.  She has a history of recurrent blood clots and underwent a hematological workup to identify any mutations that might increase her risk. The workup included tests for factor V Leiden, prothrombin gene mutation, protein C activity, protein S activity, and antithrombin III activity, all of which returned negative results.  During the workup, her calcium level was noted to be elevated at 11 mg/dL, but subsequent testing showed normalization to 10.3 mg/dL. She denies taking calcium supplements and reports minimal dairy intake, primarily consuming yogurt and not drinking milk.  She has an inferior vena cava (IVC) filter placed in 2003 due to her history of recurrent clots. She is on lifelong anticoagulation therapy.    SUMMARY OF HEMATOLOGY HISTORY:  58 y.o. lady with a history of strokes and pulmonary embolism who presents for hematology evaluation. She was referred by her rheumatologist for evaluation of her history of strokes and clots.   She has experienced two strokes, the first in 1989 during pregnancy with twins and significant work stress, resulting in a 16-day hospitalization. The second stroke occurred in 2014, two weeks after her  father's passing, leading to left-sided weakness.   She has been on anticoagulation therapy since 1989, initially on Coumadin and currently on Xarelto , taken once daily without issues. She also has a history of atrial fibrillation, for which she underwent an ablation and has not experienced further issues.   She experienced pulmonary embolism starting in 1998, with the last episode in 2003, after which an inferior vena cava filter was placed. She occasionally experiences leg swelling, particularly after prolonged standing, and reports pain radiating up and down her leg.   She denied dyspnea, but did report a recent episode of chest pain, which she attributed to acid reflux. A recent episode of chest pain was attributed to acid reflux. No noticeable blood loss and she has not been on iron or B12 supplements despite slight anemia noted in recent labs.   Her father had clotting problems and was on blood thinners. [place] moved from [place] current location in [place].  Two strokes, last in 2014, and recurrent pulmonary emboli, last significant event in 2003.    On lifelong anticoagulation with Xarelto .    IVC filter placed in 2003, non-removable.    No recent thromboembolic events. Intermittent leg swelling likely due to prolonged standing, without pain or redness.    Family history of clotting issues in father.    Given her history of recurrent thromboembolism and history of CVAs, we pursued thrombophilia workup on her consultation with us  on 07/01/2024.  Prothrombin gene mutation, factor V Leiden mutation, beta-2 glycoprotein antibodies, protein C activity, protein S activity, Antithrombin III activity were all unremarkable.  D-dimer was undetectable.  Anticardiolipin IgM was slightly increased at 16, indeterminate, likely  from concurrent Xarelto  use.  IgG and IgA levels were within normal limits.   Regardless of these test results, she needs to remain on indefinite anticoagulation given her history  of recurrent thromboembolism.   Continue Xarelto  for lifelong anticoagulation.   Since she previously had anemia, we checked iron studies and they were unremarkable.  Hemoglobin spontaneously improved to 11.9.   REVIEW OF SYSTEMS:    Review of Systems - Oncology  All other pertinent systems were reviewed with the patient and are negative.  I have reviewed the past medical history, past surgical history, social history and family history with the patient and they are unchanged from previous note.  ALLERGIES:  She is allergic to acetaminophen -codeine, bee venom, meloxicam, tramadol, codeine, coconut (cocos nucifera), and tomato.  MEDICATIONS:  Current Outpatient Medications  Medication Sig Dispense Refill   albuterol  (VENTOLIN  HFA) 108 (90 Base) MCG/ACT inhaler Inhale 2 puffs into the lungs every 6 (six) hours as needed for wheezing or shortness of breath.     amitriptyline (ELAVIL) 10 MG tablet Take 10 mg by mouth at bedtime.     bumetanide  (BUMEX ) 1 MG tablet Take 1 tablet (1 mg total) by mouth daily for 10 days. 10 tablet 0   Cholecalciferol (VITAMIN D-3) 125 MCG (5000 UT) TABS Take 5,000 Units by mouth daily.     MOUNJARO 12.5 MG/0.5ML Pen Inject 12.5 mg into the skin once a week.     ofloxacin (FLOXIN) 0.3 % OTIC solution Place 5 drops into both ears 2 (two) times daily.     Omega-3 Fatty Acids (FISH OIL) 1200 MG CAPS Take 1,200 mg by mouth daily with breakfast.     oxyCODONE -acetaminophen  (PERCOCET) 7.5-325 MG tablet Take 1 tablet by mouth every 6 (six) hours as needed for severe pain (pain score 7-10) (Headaches).     rivaroxaban  (XARELTO ) 20 MG TABS tablet Take 20 mg by mouth at bedtime.      sertraline  (ZOLOFT ) 50 MG tablet Take 50 mg by mouth in the morning.     zolpidem  (AMBIEN  CR) 6.25 MG CR tablet Take 1 tablet (6.25 mg total) by mouth at bedtime. 30 tablet 2   No current facility-administered medications for this visit.    PHYSICAL EXAMINATION:  Not performed today as  it was a phone only visit  LABORATORY DATA:   I have reviewed the data as listed.  Recent Results (from the past 2160 hours)  CK     Status: None   Collection Time: 05/11/24 11:06 AM  Result Value Ref Range   Total CK 35 21 - 240 U/L  Aldolase     Status: None   Collection Time: 05/11/24 11:06 AM  Result Value Ref Range   Aldolase 3.8 < OR = 8.1 U/L  3-Hydroxy-3-Methylglutaryl-Coenzyme A Reductase (HMGCR) AB (IgG)     Status: None   Collection Time: 05/11/24 11:06 AM  Result Value Ref Range   HMGCR AB (IGG) <2 <20 CU    Comment: . 3-Hydroxy-3-Methylglutaryl-Coenzyme A Reductase (HMGCR) Ab is associated with necrotizing myopathy and is often found with the use of statin medications. Rarely, HMGCR Ab associated myositis has also been seen in patients ingesting mushrooms and other foods.   CBC     Status: Abnormal   Collection Time: 05/27/24 11:40 PM  Result Value Ref Range   WBC 5.1 4.0 - 10.5 K/uL   RBC 3.83 (L) 3.87 - 5.11 MIL/uL   Hemoglobin 10.1 (L) 12.0 - 15.0 g/dL   HCT 67.0 (L)  36.0 - 46.0 %   MCV 85.9 80.0 - 100.0 fL   MCH 26.4 26.0 - 34.0 pg   MCHC 30.7 30.0 - 36.0 g/dL   RDW 84.8 88.4 - 84.4 %   Platelets 247 150 - 400 K/uL   nRBC 0.0 0.0 - 0.2 %    Comment: Performed at [facility], [address] [place], [place] [address]  Basic metabolic panel     Status: Abnormal   Collection Time: 05/27/24 11:40 PM  Result Value Ref Range   Sodium 142 135 - 145 mmol/L   Potassium 3.6 3.5 - 5.1 mmol/L   Chloride 106 98 - 111 mmol/L   CO2 26 22 - 32 mmol/L   Glucose, Bld 103 (H) 70 - 99 mg/dL    Comment: Glucose reference range applies only to samples taken after fasting for at least 8 hours.   BUN 14 6 - 20 mg/dL   Creatinine, Ser 9.35 0.44 - 1.00 mg/dL   Calcium 89.8 8.9 - 89.6 mg/dL   GFR, Estimated >39 >39 mL/min    Comment: (NOTE) Calculated using the CKD-EPI Creatinine Equation (2021)    Anion gap 10 5 - 15    Comment: Performed at [facility], [address], [place], [place] [address]  Urinalysis, Routine w reflex microscopic -Urine, Clean Catch     Status: Abnormal   Collection Time: 05/28/24 12:57 AM  Result Value Ref Range   Color, Urine YELLOW YELLOW   APPearance HAZY (A) CLEAR   Specific Gravity, Urine 1.017 1.005 - 1.030   pH 6.0 5.0 - 8.0   Glucose, UA NEGATIVE NEGATIVE mg/dL   Hgb urine dipstick NEGATIVE NEGATIVE   Bilirubin Urine NEGATIVE NEGATIVE   Ketones, ur NEGATIVE NEGATIVE mg/dL   Protein, ur NEGATIVE NEGATIVE mg/dL   Nitrite NEGATIVE NEGATIVE   Leukocytes,Ua NEGATIVE NEGATIVE    Comment: Performed at [facility], [address] [place], [place] [address]  Ferritin     Status: None   Collection Time: 07/01/24  2:21 PM  Result Value Ref Range   Ferritin 46 11 - 307 ng/mL    Comment: Performed at [facility], [address] [place], [place] [address]  Iron and Iron Binding Capacity (CC-[facility] only)     Status: Abnormal   Collection Time: 07/01/24  2:21 PM  Result Value Ref Range   Iron 36 28 - 170 ug/dL   TIBC 627 749 - 549 ug/dL   Saturation Ratios 10 (L) 10.4 - 31.8 %   UIBC 336 148 - 442 ug/dL    Comment: Performed at [facility], [address] [place], [place] [address]  Antithrombin panel     Status: Abnormal   Collection Time: 07/01/24  2:21 PM  Result Value Ref Range   Antithrombin Activity 186 (H) 75 - 135 %    Comment: (NOTE) An elevated antithrombin activity is of no known clinical significance. Direct Xa inhibitor anticoagulants such as rivaroxaban , apixaban and edoxaban will lead to spuriously elevated antithrombin activity levels possibly masking a deficiency.    AT III AG PPP IMM-ACNC 130 (H) 72 - 124 %    Comment: (NOTE) This test was developed and its performance characteristics determined by [facility]. It has not been cleared or approved by the Food and Drug Administration. Performed At: [name] [address] [place], [place] [id number] Jennette Shorter MD Ey:[phone]   PROTEIN S PANEL  Status: None   Collection Time: 07/01/24  2:21 PM  Result Value Ref Range   Protein S Ag, Total 100 60 - 150 %    Comment: (NOTE) This test was developed and its performance characteristics determined by [facility]. It has not been cleared or approved by the Food and Drug Administration.    Protein S Ag, Free 94 61 - 136 %   Protein S Activity 125 63 - 140 %    Comment: (NOTE) Protein S activity may be falsely increased (masking an abnormal, low result) in patients receiving direct Xa inhibitor (e.g., rivaroxaban , apixaban, edoxaban) or a direct thrombin inhibitor (e.g., dabigatran) anticoagulant treatment due to assay interference by these drugs. Performed At: [facility] [address] [place], [place] [id number] Jennette Shorter MD Ey:[phone]   Protein C activity     Status: None   Collection Time: 07/01/24  2:21 PM  Result Value Ref Range   Protein C Activity 150 73 - 180 %    Comment: (NOTE) Performed At: [facility] [address] [place], [place] [id number] Jennette Shorter MD Ey:[id number]   Cardiolipin antibodies, IgG, IgM, IgA     Status: Abnormal   Collection Time: 07/01/24  2:21 PM  Result Value Ref Range   Anticardiolipin IgG <9 0 - 14 GPL U/mL    Comment: (NOTE)                          Negative:              <15                          Indeterminate:     15 - 20                          Low-Med Positive: >20 - 80                          High Positive:         >80    Anticardiolipin IgM 16 (H) 0 - 12 MPL U/mL    Comment: (NOTE)                          Negative:              <13                          Indeterminate:     13 - 20                          Low-Med Positive: >20 - 80                          High Positive:         >80    Anticardiolipin IgA <9 0 - 11 APL U/mL    Comment: (NOTE)                           Negative:              <12  Indeterminate:     12 - 20                          Low-Med Positive: >20 - 80                          High Positive:         >80 Performed At: [facility] [place] [address] [place], [place] [id number] Jennette Shorter MD Ey:[id number]   Beta-2-glycoprotein i abs, IgG/M/A     Status: None   Collection Time: 07/01/24  2:21 PM  Result Value Ref Range   Beta-2 Glyco I IgG <9 0 - 20 GPI IgG units   Beta-2-Glycoprotein I IgM <9 0 - 32 GPI IgM units    Comment: (NOTE) Performed At: [facility] [address] [place], [place] [id number] Jennette Shorter MD Ey:[id number]    Beta-2-Glycoprotein I IgA <9 0 - 25 GPI IgA units  Prothrombin gene mutation     Status: None   Collection Time: 07/01/24  2:21 PM  Result Value Ref Range   Recommendations-PTGENE: Comment     Comment: (NOTE) Result: c.*97G>A - Not Detected This result is not associated with an increased risk for venous thromboembolism. See Additional Clinical Information and Comments. Additional Clinical Information: Venous thromboembolism is a multifactorial disease influenced by genetic, environmental, and circumstantial risk factors. The c.*97G>A variant in the F2 gene is a genetic risk factor for venous thromboembolism. Heterozygous carriers have a 2- to 4-fold increased risk for venous thromboembolism. Homozygotes for the c.*97G>A variant are rare. The annual risk of VTE in homozygotes has been reported to be 1.1%/year. Individuals who carry both a c.*97G>A variant in the F2 gene and a c.1601G>A (p. Arg534Gln) variant in the F5 gene (commonly referred to as Factor V Leiden) have an approximately 20- fold increased risk for venous thromboembolism. Risks are likely to be even higher in more complex genotype combinations involving the F2 c.*97G>A variant and Factor V Leiden (PMID:  66325232). Additional risk factors include but are not limited to: deficiency of  protein C, protein S, or antithrombin III, age, female sex, personal or family history of deep vein thromboembolism, smoking, surgery, prolonged immobilization, malignant neoplasm, tamoxifen treatment, raloxifene treatment, oral contraceptive use, hormone replacement therapy, and pregnancy. Management of thrombotic risk and thrombotic events should follow established guidelines and fit the clinical circumstance. This result cannot predict the occurrence or recurrence of a thrombotic event. Comments: Genetic counseling is recommended to discuss the potential clinical implications of positive results, as well as recommendations for testing family members. Genetic Coordinators are available for health care providers to discuss results at [phone]). Test Details: Variant analyzed: c.*97G>A, previously referred to as G20210A Methods/Limitations: DNA analysis of the F2 gene (NM_000 506.5) was performed by PCR amplification followed by restriction enzyme analysis. The diagnostic sensitivity is >99%. Results must be combined with clinical information for the most accurate interpretation. Molecular-based testing is highly accurate, but as in any laboratory test, diagnostic errors may occur. False positive or false negative results may occur for reasons that include genetic variants, blood transfusions, bone marrow transplantation, somatic or tissue-specific mosaicism, mislabeled samples, or erroneous representation of family relationships. This test was developed and its performance characteristics determined by [facility]. It has not been cleared or approved by the Food and Drug Administration. References: Bhatt S, Taylor AK, Lozano R, Grody [facility], Signa [facility]; [facility] Professional Practice and Guidelines Committee. Addendum:  [employer] of [employer] Genetics consensus statement on factor V Leiden mutation testing. Genet Med. 2021 Mar 5. doi: 89.8961/d585 [phone]-x. PMID:  66325232. Hosey RUSH. Prothrombin Thrombophilia. 2006 Jul 25 [Updated 2021 Feb 4]. In: Juliene POSNER, Ardinger HH, Pagon RA, et al., editors. GeneReviews(R) [Internet]. [address] [facility]): [employer] , [place]; 8006-7978. Available from: [email or web address] Laurita GORMAN Waddell BRIDGETT, Huang X, Luo B, Spector EB, Ileana SHAUNNA Gal CS; [employer] Laboratory Quality Assurance Committee. Venous thromboembolism laboratory testing (factor V Leiden and factor II c.*97G>A), 2018 update: a technical standard of the [employer] of [employer] and [employer] ([facility]). Genet Med. 2018 Dec;20(12):1489-1498. doi: 10.1038/[phone])[phone]. Epub 2018 Oct 5. PMID: 69702301.    Reviewed by: Comment     Comment: (NOTE) Technical Component performed at [facility] [place] Professional Component performed by: [name] WENDI Aid, PhD, [facility] [id number], [facility], [address] [place] [address] Performed At: [facility] [place] [address] [place], [place] [id number] Loran Gales MDPhD Ey:[id number]   Factor 5 leiden     Status: None   Collection Time: 07/01/24  2:21 PM  Result Value Ref Range   Recommendations-F5LEID: Comment     Comment: (NOTE) Result: c.1601G>A (p.Arg534Gln) - Not Detected This result is not associated with an increased risk for venous thromboembolism. See Additional Clinical Information and Comments. Additional Clinical Information:    Venous thromboembolism is a multifactorial disease influenced by genetic, environmental, and circumstantial risk factors. The c.1601G>A (p. Arg534Gln) variant in the F5 gene, commonly referred to as Factor V Leiden, is a genetic risk factor for venous thromboembolism. Heterozygous carriers of this variant have a 6- to 8- fold increased risk for venous thromboembolism. Individuals homozygous for this variant (ie, with a copy of the variant on each chromosome) have an approximately 80-fold increased risk for venous thromboembolism.  Individuals who carry both a c.*97G>A variant in the F2 gene and Factor V Leiden have an approximately 20-fold increased risk for venous thromboembolism. Risks are likely to be even higher in more complex genotype combinations  involving the F2 c.*97G>A variant and Factor V Leiden (PMID: 66325232). Additional risk factors include but are not limited to: deficiency of protein C, protein S, or antithrombin III, age, female sex, personal or family history of deep vein thromboembolism, smoking, surgery, prolonged immobilization, malignant neoplasm, tamoxifen treatment, raloxifene treatment, oral contraceptive use, hormone replacement therapy, and pregnancy. Management of thrombotic risk and thrombotic events should follow established guidelines and fit the clinical circumstance. This result cannot predict the occurrence or recurrence of a thrombotic event. Comment:    Genetic counseling is recommended to discuss the potential clinical implications of positive results, as well as recommendations for testing family members.    Genetic Coordinators are available for health care providers to discuss results at [phone]). Test Details:    Variant Analyzed: c.1601G>A (p. Arg534Gln), referre d to as Factor V Leiden Methods/Limitations:    DNA analysis of the F5 gene (NM_000130.5) was performed by PCR amplification followed by electrophoresis. The diagnostic sensitivity is >99%. Results must be combined with clinical information for the most accurate interpretation. Molecular-based testing is highly accurate, but as in any laboratory test, diagnostic errors may occur. False positive or false negative results may occur for reasons that include genetic variants, blood transfusions, bone marrow transplantation, somatic or tissue-specific mosaicism, mislabeled samples, or erroneous representation of family relationships.    This test was developed and its performance  characteristics determined by [facility]. It has not been cleared or approved by the Food and Drug  Administration. References:    Bhatt S, Taylor AK, Lozano R, Grody [facility], Signa [facility]; ACMG Professional Practice and Guidelines Committee. Addendum: [employer] of Medical Genetics consensus statemen t on factor V Leiden mutation testing. Genet Med. 2021 Mar 5. doi: 89.8961/d58563-978- 01108-x. PMID: 66325232.    Hosey RUSH. Factor V Leiden Thrombophilia. 1999 May 14 (Updated 2018 Jan 4). In: Juliene POSNER, Ardinger HH, Pagon RA, et al., editors. GeneReviews(R) (Internet). [address] [facility]): [employer] , [place]; 8006-7978. Available from: [email or web address]    Laurita GORMAN Waddell BRIDGETT, Huang X, Luo B, Spector EB, Ileana SHAUNNA Gal CS; [facility] Laboratory Quality Assurance Committee. Venous thromboembolism laboratory testing (factor V Leiden and factor II c. *97G>A), 2018 update: a technical standard of the [employer] ([facility]). Genet Med. 2018 Izr;79(87): 8510-8501. doi: 10.1038/[id number]. Epub 2018 Oct 5. PMID: 69702301.    Reviewed By: Comment     Comment: (NOTE) Technical Component performed at [facility] Professional Component performed by: Larose Agent, PhD, [facility] [id number], [facility], [address] [place] [address] Performed At: [facility] [address] [place], [place] [id number] Loran Gales MDPhD Ey:[id number]   D-dimer, quantitative     Status: None   Collection Time: 07/01/24  2:21 PM  Result Value Ref Range   D-Dimer, Quant <0.27 0.00 - 0.50 ug/mL-FEU    Comment: (NOTE) At the manufacturer cut-off value of 0.5 g/mL FEU, this assay has a negative predictive value of 95-100%.This assay is intended for use in conjunction with a clinical pretest probability (PTP) assessment model to exclude pulmonary embolism (PE) and deep venous thrombosis (DVT) in outpatients suspected of PE or DVT. Results should be  correlated with clinical presentation. Performed at [facility], [address] [place], [place] [address]   CMP (Cancer Center only)     Status: Abnormal   Collection Time: 07/01/24  2:21 PM  Result Value Ref Range   Sodium 140 135 - 145 mmol/L   Potassium 3.6 3.5 - 5.1 mmol/L   Chloride 106 98 - 111 mmol/L   CO2 30 22 - 32 mmol/L   Glucose, Bld 91 70 - 99 mg/dL    Comment: Glucose reference range applies only to samples taken after fasting for at least 8 hours.   BUN 13 6 - 20 mg/dL   Creatinine 9.16 9.55 - 1.00 mg/dL   Calcium 88.9 (H) 8.9 - 10.3 mg/dL   Total Protein 7.6 6.5 - 8.1 g/dL   Albumin 4.1 3.5 - 5.0 g/dL   AST 10 (L) 15 - 41 U/L   ALT 7 0 - 44 U/L   Alkaline Phosphatase 165 (H) 38 - 126 U/L   Total Bilirubin 0.3 0.0 - 1.2 mg/dL   GFR, Estimated >39 >39 mL/min    Comment: (NOTE) Calculated using the CKD-EPI Creatinine Equation (2021)    Anion gap 4 (L) 5 - 15    Comment: Performed at [facility], [address] [place], [place] [address]  CBC with Differential (Cancer Center Only)     Status: Abnormal   Collection Time: 07/01/24  2:21 PM  Result Value Ref Range   WBC Count 6.4 4.0 - 10.5 K/uL   RBC 4.28 3.87 - 5.11 MIL/uL   Hemoglobin 11.5 (L) 12.0 - 15.0 g/dL   HCT 64.2 (L) 63.9 - 53.9 %   MCV 83.4 80.0 - 100.0 fL   MCH 26.9 26.0 - 34.0 pg   MCHC 32.2 30.0 - 36.0 g/dL  RDW 14.6 11.5 - 15.5 %   Platelet Count 227 150 - 400 K/uL   nRBC 0.0 0.0 - 0.2 %   Neutrophils Relative % 72 %   Neutro Abs 4.6 1.7 - 7.7 K/uL   Lymphocytes Relative 19 %   Lymphs Abs 1.2 0.7 - 4.0 K/uL   Monocytes Relative 7 %   Monocytes Absolute 0.5 0.1 - 1.0 K/uL   Eosinophils Relative 2 %   Eosinophils Absolute 0.1 0.0 - 0.5 K/uL   Basophils Relative 0 %   Basophils Absolute 0.0 0.0 - 0.1 K/uL   Immature Granulocytes 0 %   Abs Immature Granulocytes 0.01 0.00 - 0.07 K/uL    Comment: Performed at [facility],  [address], [place], [place] [address]  Basic metabolic panel     Status: Abnormal   Collection Time: 07/05/24 10:23 AM  Result Value Ref Range   Sodium 138 135 - 145 mmol/L   Potassium 4.5 3.5 - 5.1 mmol/L    Comment: HEMOLYSIS AT THIS LEVEL MAY AFFECT RESULT   Chloride 102 98 - 111 mmol/L   CO2 27 22 - 32 mmol/L   Glucose, Bld 97 70 - 99 mg/dL    Comment: Glucose reference range applies only to samples taken after fasting for at least 8 hours.   BUN 14 6 - 20 mg/dL   Creatinine, Ser 9.13 0.44 - 1.00 mg/dL   Calcium 89.4 (H) 8.9 - 10.3 mg/dL   GFR, Estimated >39 >39 mL/min    Comment: (NOTE) Calculated using the CKD-EPI Creatinine Equation (2021)    Anion gap 9 5 - 15    Comment: Performed at [facility], [address] [place], [place] [address]  CBC     Status: Abnormal   Collection Time: 07/05/24 10:23 AM  Result Value Ref Range   WBC 5.4 4.0 - 10.5 K/uL   RBC 4.40 3.87 - 5.11 MIL/uL   Hemoglobin 11.7 (L) 12.0 - 15.0 g/dL   HCT 62.4 63.9 - 53.9 %   MCV 85.2 80.0 - 100.0 fL   MCH 26.6 26.0 - 34.0 pg   MCHC 31.2 30.0 - 36.0 g/dL   RDW 85.5 88.4 - 84.4 %   Platelets 240 150 - 400 K/uL   nRBC 0.0 0.0 - 0.2 %    Comment: Performed at [facility], [address] [place], [place] [address]  Pro Brain natriuretic peptide     Status: None   Collection Time: 07/05/24 10:23 AM  Result Value Ref Range   Pro Brain Natriuretic Peptide 53.7 <300.0 pg/mL    Comment: (NOTE) Age Group        Cut-Points    Interpretation  < 50 years     450 pg/mL       NT-proBNP > 450 pg/mL indicates                                ADHF is likely              50 to 75 years  900 pg/mL      NT-proBNP > 900 pg/mL indicates          ADHF is likely  > 75 years      1800 pg/mL     NT-proBNP > 1800 pg/mL indicates          ADHF is likely  All ages    Results between       Indeterminate. Further clinical             300 and the cut-    information is needed to determine            point for age group   if ADHF is present.                                                             Elecsys proBNP II/ Elecsys proBNP II STAT           Cut-Point                       Interpretation  300 pg/mL                    NT-proBNP <300pg/mL indicates                             ADHF is not likely  Performed at [facility], [address], [place], [place] [address]   Troponin T, High Sensitivity     Status: None   Collection Time: 07/05/24 11:37 AM  Result Value Ref Range   Troponin T High Sensitivity <15 0 - 19 ng/L    Comment: (NOTE) Biotin concentrations > 1000 ng/mL falsely decrease TnT results.  Serial cardiac troponin measurements are suggested.  Refer to the Links section for chest pain algorithms and additional  guidance. Performed at [facility], [address] [place], [place] [address]   Troponin T, High Sensitivity     Status: None   Collection Time: 07/05/24  1:43 PM  Result Value Ref Range   Troponin T High Sensitivity <15 0 - 19 ng/L    Comment: (NOTE) Biotin concentrations > 1000 ng/mL falsely decrease TnT results.  Serial cardiac troponin measurements are suggested.  Refer to the Links section for chest pain algorithms and additional  guidance. Performed at [facility] [place], [address] [place], [place] [address]   CBC with Differential (Cancer Center Only)     Status: Abnormal   Collection Time: 07/14/24  2:28 PM  Result Value Ref Range   WBC Count 5.9 4.0 - 10.5 K/uL   RBC 4.34 3.87 - 5.11 MIL/uL   Hemoglobin 11.9 (L) 12.0 - 15.0 g/dL   HCT 63.8 63.9 - 53.9 %   MCV 83.2 80.0 - 100.0 fL   MCH 27.4 26.0 - 34.0 pg   MCHC 33.0 30.0 - 36.0 g/dL   RDW 85.5 88.4 - 84.4 %   Platelet Count 238 150 - 400 K/uL   nRBC 0.0 0.0 - 0.2 %   Neutrophils Relative % 59 %   Neutro Abs 3.5 1.7 - 7.7 K/uL   Lymphocytes Relative 30 %   Lymphs Abs 1.8  0.7 - 4.0 K/uL   Monocytes Relative 8 %   Monocytes Absolute 0.5 0.1 - 1.0 K/uL   Eosinophils Relative 2 %   Eosinophils Absolute 0.1 0.0 - 0.5 K/uL   Basophils Relative 1 %   Basophils Absolute 0.0 0.0 - 0.1 K/uL   Immature Granulocytes 0 %   Abs Immature Granulocytes 0.01 0.00 -  0.07 K/uL    Comment: Performed at [facility], [address], [place], [place] [address]  CMP (Cancer Center only)     Status: Abnormal   Collection Time: 07/14/24  2:28 PM  Result Value Ref Range   Sodium 141 135 - 145 mmol/L   Potassium 3.8 3.5 - 5.1 mmol/L   Chloride 106 98 - 111 mmol/L   CO2 31 22 - 32 mmol/L   Glucose, Bld 87 70 - 99 mg/dL    Comment: Glucose reference range applies only to samples taken after fasting for at least 8 hours.   BUN 8 6 - 20 mg/dL   Creatinine 9.19 9.55 - 1.00 mg/dL   Calcium 89.6 8.9 - 89.6 mg/dL   Total Protein 7.4 6.5 - 8.1 g/dL   Albumin 3.9 3.5 - 5.0 g/dL   AST 13 (L) 15 - 41 U/L   ALT 11 0 - 44 U/L   Alkaline Phosphatase 155 (H) 38 - 126 U/L   Total Bilirubin 0.3 0.0 - 1.2 mg/dL   GFR, Estimated >39 >39 mL/min    Comment: (NOTE) Calculated using the CKD-EPI Creatinine Equation (2021)    Anion gap 4 (L) 5 - 15    Comment: Performed at [facility], [address], [place], [place] [address]     RADIOGRAPHIC STUDIES:  I have personally reviewed the radiological images as listed and agree with the findings in the report.  DG Ankle Complete Left Result Date: 07/10/2024 Please see detailed radiograph report in office note.  DG Chest 2 View Result Date: 07/05/2024 CLINICAL DATA:  Shortness of breath and left-sided chest pain. EXAM: CHEST - 2 VIEW COMPARISON:  03/09/2024 FINDINGS: Lungs are somewhat hypoinflated as a lordotic technique is demonstrated on the frontal image. No evidence of focal lobar consolidation or effusion. Minimal prominence of the central pulmonary vasculature likely due to the degree of  hypoinflation. Borderline stable cardiomegaly. Remainder of the exam is unchanged. IMPRESSION: 1. Hypoinflation without acute cardiopulmonary disease. 2. Borderline stable cardiomegaly. Electronically Signed   By: Toribio Agreste M.D.   On: 07/05/2024 11:12       Future Appointments  Date Time Provider Department Center  07/22/2024 10:40 AM Szer, Asberry, DO [facility] None  07/25/2024  7:00 AM GI-315 MR 3 GI-315MRI GI-[facility] [facility]  08/07/2024  8:45 AM Tobie Franky SQUIBB, DPM [facility]  08/24/2024  1:00 PM Carlie Huxley [facility] None  09/01/2024  2:00 PM Carlie Huxley [facility] None  09/15/2024  1:00 PM Carlie Huxley [facility] None  09/28/2024  3:00 PM Carlie Huxley [facility] None  10/12/2024  3:00 PM Carlie Huxley [facility] None    This document was completed utilizing speech recognition software. Grammatical errors, random word insertions, pronoun errors, and incomplete sentences are an occasional consequence of this system due to software limitations, ambient noise, and hardware issues. Any formal questions or concerns about the content, text or information contained within the body of this dictation should be directly addressed to the provider for clarification.

## 2024-07-15 NOTE — H&P (View-Only) (Signed)
 Patient ID: Meghan [name], female   DOB: May 01, 1966, 58 y.o.   MRN: [id number]  Reason for Consult: Follow-up   Referred by Health, [address]  Subjective:     HPI:  Meghan [name] is a 58 y.o. female history of left lower extremity venous intervention for chronic venous insufficiency.  She now has multiple months of left sided headache with visual changes, jaw claudication and pain over her temporal artery.  She does have elevated inflammatory markers and is here today to discuss possible temporal artery biopsy  Past Medical History:  Diagnosis Date   (HFpEF) heart failure with preserved ejection fraction (HCC) 01/2019   a.) Dx'd in [place] , [place]; b.) TTE 08/14/2022: EF 60-65%, mod LVH, mild LAE, triv MR   Anxiety    Asthma 05/69   Atrial fibrillation and flutter (HCC) 02/16/2019   a.) CHA2DS2VASc = 6 (sex, HFpEF, HTN, CVA x2, T2DM);  b.) s/p DCCV (200 J x 1) 08/22/2022; c.) s/p RF ablation (CTI) 11/02/2022; d.) rate/rhythm maintained on oral flecainide  + metoprolol  succinate; chronically anticoagulated with rivaroxaban    CHF (congestive heart failure) (HCC) 01/2019   Class 3 severe obesity due to excess calories with body mass index (BMI) of 50.0 to 59.9 in adult [facility])    DDD (degenerative disc disease), lumbar    GERD (gastroesophageal reflux disease)    Heart murmur 07/2017   Hiatal hernia    History of head injury    HTN (hypertension)    Hypercholesterolemia    Ileus (HCC)    Intractable nausea and vomiting 11/16/2020   Long term current use of anticoagulant    a.) rivaroxaban    Macular degeneration of left eye    MDD (major depressive disorder)    OA (osteoarthritis)    OSA on CPAP    Pneumonia    Presence of IVC filter    PTSD (post-traumatic stress disorder)    Recurrent pulmonary emboli (HCC)    a.) s/p IVC filter placement 2003; b.) previously followed by hematology when living in [place] , [place]   Seizure disorder (HCC)    a.) last was in 2020 per pt report    Stroke (HCC) 02/1995   1989 and 1995 (left sided weakness)   T2DM (type 2 diabetes mellitus) (HCC)    Vitamin D deficiency    Family History  Problem Relation Age of Onset   Breast cancer Mother    Heart attack Father    Diabetes Father    Congestive Heart Failure Father    Clotting disorder Father    Heart disease Father    Heart failure Father    Hypertension Father    Heart failure Brother    Breast cancer Maternal Grandmother    Arrhythmia Paternal Grandmother    Past Surgical History:  Procedure Laterality Date   A-FLUTTER ABLATION N/A 11/02/2022   Procedure: A-FLUTTER ABLATION;  Surgeon: Mealor, Eulas BRAVO, MD;  [facility]: [facility];  Service: Cardiovascular;  Laterality: N/A;   ARTHRODESIS METATARSALPHALANGEAL JOINT (MTPJ) Left 02/25/2023   Procedure: ARTHRODESIS METATARSALPHALANGEAL JOINT (MTPJ);  Surgeon: Tobie Franky SQUIBB, DPM;  Location: [facility] ORS;  Service: Podiatry;  Laterality: Left;   CARDIAC CATHETERIZATION  2017   in [facility] in [place]   CARDIOVERSION N/A 08/22/2022   Procedure: CARDIOVERSION;  Surgeon: Sheena Pugh, DO;  Location: [facility];  Service: Cardiovascular;  Laterality: N/A;   CESAREAN SECTION N/A 1989   CESAREAN SECTION N/A 1993   CHOLECYSTECTOMY  2003   COLONOSCOPY  WITH PROPOFOL  N/A 06/05/2019   Procedure: COLONOSCOPY WITH PROPOFOL ;  Surgeon: Rollin Dover, MD;  Location: [facility];  Service: Endoscopy;  Laterality: N/A;   CYST REMOVAL WITH BONE GRAFT Left 10/18/2020   Procedure: BONE GRAFTING OF ENCHONDROMA MIDDLE PHANLANX OF LEFT MIDDLE FINGER;  Surgeon: Murrell Kuba, MD;  Location: [facility] OR;  Service: Orthopedics;  Laterality: Left;  AXILLARY BLOCK   DIAGNOSTIC LAPAROSCOPY  2015; 2017   lap hernia repair x2   ENDOVENOUS ABLATION SAPHENOUS VEIN W/ LASER Left 08/31/2021   endovenous laser ablation left greater saphenous vein and stab phlebectomy 10-20 incisions left leg by Penne Colorado MD   HALLUX FUSION Right 05/28/2022    Procedure: HALLUX FUSION METATARSAL PHALANGEAL JOIT;  Surgeon: Tobie Franky SQUIBB, DPM;  Location: [facility];  Service: Podiatry;  Laterality: Right;  BLOCK   HERNIA REPAIR  2014, 2018   umbilical hernia repair   IVC FILTER INSERTION  2003   POLYPECTOMY  06/05/2019   Procedure: POLYPECTOMY;  Surgeon: Rollin Dover, MD;  Location: [facility];  Service: Endoscopy;;   SHOULDER ARTHROSCOPY W/ ROTATOR CUFF REPAIR Right 08/28/2017   TOTAL ABDOMINAL HYSTERECTOMY  2003   UPPER GI ENDOSCOPY     WRIST ARTHROSCOPY WITH DEBRIDEMENT Left 10/18/2020   Procedure: LEFT WRIST ARTHROSCOPY WITH DEBRIDEMENT;  Surgeon: Murrell Kuba, MD;  Location: [facility] OR;  Service: Orthopedics;  Laterality: Left;  AXILLARY BLOCK   WRIST ARTHROSCOPY WITH DEBRIDEMENT Right 01/25/2022   Procedure: ARTHROSCOPY RIGHT WRIST WITH  DEBRIDEMENT/ SHRINKAGE;  Surgeon: Murrell Franky, MD;  Location: [facility] OR;  Service: Orthopedics;  Laterality: Right;    Short Social History:  Social History   Tobacco Use   Smoking status: Never    Passive exposure: Never   Smokeless tobacco: Never  Substance Use Topics   Alcohol use: Never    Allergies  Allergen Reactions   Acetaminophen -Codeine Swelling, Rash and Other (See Comments)    Tylenol  with Codeine, Tylenol  #3,  (facial swelling, hives)   Bee Venom Anaphylaxis   Meloxicam Anaphylaxis and Rash   Tramadol Hives and Other (See Comments)    Patient stated I was trippin' and I do not want that ever again   Codeine Hives and Swelling   Coconut (Cocos Nucifera) Rash and Other (See Comments)    ANY coconut products    Tomato Rash    Current Outpatient Medications  Medication Sig Dispense Refill   albuterol  (VENTOLIN  HFA) 108 (90 Base) MCG/ACT inhaler Inhale 2 puffs into the lungs every 6 (six) hours as needed for wheezing or shortness of breath.     amitriptyline (ELAVIL) 10 MG tablet Take 10 mg by mouth at bedtime.     bumetanide  (BUMEX ) 1 MG tablet Take 1 tablet (1 mg  total) by mouth daily for 10 days. 10 tablet 0   Cholecalciferol (VITAMIN D-3) 125 MCG (5000 UT) TABS Take 5,000 Units by mouth daily.     MOUNJARO 12.5 MG/0.5ML Pen Inject 12.5 mg into the skin once a week.     ofloxacin (FLOXIN) 0.3 % OTIC solution Place 5 drops into both ears 2 (two) times daily.     Omega-3 Fatty Acids (FISH OIL) 1200 MG CAPS Take 1,200 mg by mouth daily with breakfast.     oxyCODONE -acetaminophen  (PERCOCET) 7.5-325 MG tablet Take 1 tablet by mouth every 6 (six) hours as needed for severe pain (pain score 7-10) (Headaches).     rivaroxaban  (XARELTO ) 20 MG TABS tablet Take 20 mg by mouth at bedtime.  sertraline  (ZOLOFT ) 50 MG tablet Take 50 mg by mouth in the morning.     zolpidem  (AMBIEN  CR) 6.25 MG CR tablet Take 1 tablet (6.25 mg total) by mouth at bedtime. 30 tablet 2   No current facility-administered medications for this visit.    Review of Systems  Constitutional:  Constitutional negative. HENT: HENT negative.  Eyes: Positive for visual disturbance.   Respiratory: Respiratory negative.  Cardiovascular: Cardiovascular negative.  GI: Gastrointestinal negative.  Skin: Skin negative.  Neurological: Positive for headaches.  Hematologic: Hematologic/lymphatic negative.  Psychiatric: Psychiatric negative.        Objective:  Objective   Vitals:   07/15/24 1143  BP: 113/76  Pulse: 67  Temp: 97.9 F (36.6 C)  SpO2: 93%  Weight: (!) 305 lb (138.3 kg)  Height: 5' 5 (1.651 m)   Body mass index is 50.75 kg/m.  Physical Exam HENT:     Head: Normocephalic.     Comments: Pain to palpation over palpable left temporal artery    Nose: Nose normal.     Mouth/Throat:     Mouth: Mucous membranes are moist.  Cardiovascular:     Rate and Rhythm: Normal rate.  Pulmonary:     Effort: Pulmonary effort is normal.  Abdominal:     General: Abdomen is flat.  Musculoskeletal:     Cervical back: Normal range of motion and neck supple.  Neurological:     Mental  Status: She is alert.     Data:  ESR: 92   MRI HEAD WITHOUT AND WITH CONTRAST   TECHNIQUE: Multiplanar, multiecho pulse sequences of the brain and surrounding structures were obtained without and with intravenous contrast.   CONTRAST:  10mL GADAVIST  GADOBUTROL  1 MMOL/ML IV SOLN   COMPARISON:  September 25, 2023   FINDINGS: MRI brain:   The brain volume is normal.   The signal in the brain parenchyma is normal.   No abnormal enhancement.   There is no acute or chronic infarct.   The ventricles are normal.   No mass lesion.   There are normal flow signals in the carotid arteries and basilar artery.   No significant bone marrow signal abnormality.   No significant abnormality in the paranasal sinuses or soft tissues.   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  We discussed the procedural details as well as risks and benefits of proceeding with left temporal artery biopsy.  We will schedule this in the near future.  She will need to hold Xarelto  48 hours prior to procedure.     Penne Lonni Colorado MD [facility]

## 2024-07-16 ENCOUNTER — Encounter (HOSPITAL_COMMUNITY): Payer: Self-pay

## 2024-07-16 NOTE — Pre-Procedure Instructions (Signed)
 Surgical Instructions   Your procedure is scheduled on July 21, 2024. Report to [facility] Main Entrance A at 10:20 A.M., then check in with the Admitting office. Any questions or running late day of surgery: call [phone]  Questions prior to your surgery date: call [phone], Monday-Friday, 8am-4pm. If you experience any cold or flu symptoms such as cough, fever, chills, shortness of breath, etc. between now and your scheduled surgery, please notify us  at the above number.     Remember:  Do not eat or drink after midnight the night before your surgery    Take these medicines the morning of surgery with A SIP OF WATER: sertraline  (ZOLOFT )    May take these medicines IF NEEDED: albuterol  (VENTOLIN  HFA) inhaler - please bring inhaler with you morning of surgery oxyCODONE -acetaminophen  (PERCOCET)    STOP taking your XARELTO  3 days prior to surgery. Your last dose will be October 31st.    One week prior to surgery, STOP taking any Aspirin  (unless otherwise instructed by your surgeon) Aleve , Naproxen , Ibuprofen , Motrin , Advil , Goody's, BC's, all herbal medications, Omega-3 Fatty Acids (FISH OIL), and non-prescription vitamins.   WHAT DO I DO ABOUT MY DIABETES MEDICATION?   STOP taking your MOUNJARO one week prior to surgery. DO NOT take any doses after October 27th.   HOW TO MANAGE YOUR DIABETES BEFORE AND AFTER SURGERY  Why is it important to control my blood sugar before and after surgery? Improving blood sugar levels before and after surgery helps healing and can limit problems. A way of improving blood sugar control is eating a healthy diet by:  Eating less sugar and carbohydrates  Increasing activity/exercise  Talking with your doctor about reaching your blood sugar goals High blood sugars (greater than 180 mg/dL) can raise your risk of infections and slow your recovery, so you will need to focus on controlling your diabetes during the weeks before  surgery. Make sure that the doctor who takes care of your diabetes knows about your planned surgery including the date and location.  How do I manage my blood sugar before surgery? Check your blood sugar at least 4 times a day, starting 2 days before surgery, to make sure that the level is not too high or low.  Check your blood sugar the morning of your surgery when you wake up and every 2 hours until you get to the Short Stay unit.  If your blood sugar is less than 70 mg/dL, you will need to treat for low blood sugar: Do not take insulin . Treat a low blood sugar (less than 70 mg/dL) with  cup of clear juice (cranberry or apple), 4 glucose tablets, OR glucose gel. Recheck blood sugar in 15 minutes after treatment (to make sure it is greater than 70 mg/dL). If your blood sugar is not greater than 70 mg/dL on recheck, call [phone] for further instructions. Report your blood sugar to the short stay nurse when you get to Short Stay.  If you are admitted to the hospital after surgery: Your blood sugar will be checked by the staff and you will probably be given insulin  after surgery (instead of oral diabetes medicines) to make sure you have good blood sugar levels. The goal for blood sugar control after surgery is 80-180 mg/dL.                      Do NOT Smoke (Tobacco/Vaping) for 24 hours prior to your procedure.  If you use a CPAP at  night, you may bring your mask/headgear for your overnight stay.   You will be asked to remove any contacts, glasses, piercing's, hearing aid's, dentures/partials prior to surgery. Please bring cases for these items if needed.    Patients discharged the day of surgery will not be allowed to drive home, and someone needs to stay with them for 24 hours.  SURGICAL WAITING ROOM VISITATION Patients may have no more than 2 support people in the waiting area - these visitors may rotate.   Pre-op  nurse will coordinate an appropriate time for 1 ADULT support  person, who may not rotate, to accompany patient in pre-op .  Children under the age of 88 must have an adult with them who is not the patient and must remain in the main waiting area with an adult.  If the patient needs to stay at the hospital during part of their recovery, the visitor guidelines for inpatient rooms apply.  Please refer to the [facility] website for the visitor guidelines for any additional information.   If you received a COVID test during your pre-op  visit  it is requested that you wear a mask when out in public, stay away from anyone that may not be feeling well and notify your surgeon if you develop symptoms. If you have been in contact with anyone that has tested positive in the last 10 days please notify you surgeon.      Pre-operative CHG Bathing Instructions   You can play a key role in reducing the risk of infection after surgery. Your skin needs to be as free of germs as possible. You can reduce the number of germs on your skin by washing with CHG (chlorhexidine  gluconate) soap before surgery. CHG is an antiseptic soap that kills germs and continues to kill germs even after washing.   DO NOT use if you have an allergy to chlorhexidine /CHG or antibacterial soaps. If your skin becomes reddened or irritated, stop using the CHG and notify one of our RNs at [phone].              TAKE A SHOWER THE NIGHT BEFORE SURGERY   Please keep in mind the following:  DO NOT shave, including legs and underarms, 48 hours prior to surgery.   You may shave your face before/day of surgery.  Place clean sheets on your bed the night before surgery Use a clean washcloth (not used since being washed) for shower. DO NOT sleep with pet's night before surgery.  CHG Shower Instructions:  Wash your face and private area with normal soap. If you choose to wash your hair, wash first with your normal shampoo.  After you use shampoo/soap, rinse your hair and body thoroughly to remove  shampoo/soap residue.  Turn the water OFF and apply half the bottle of CHG soap to a CLEAN washcloth.  Apply CHG soap ONLY FROM YOUR NECK DOWN TO YOUR TOES (washing for 3-5 minutes)  DO NOT use CHG soap on face, private areas, open wounds, or sores.  Pay special attention to the area where your surgery is being performed.  If you are having back surgery, having someone wash your back for you may be helpful. Wait 2 minutes after CHG soap is applied, then you may rinse off the CHG soap.  Pat dry with a clean towel  Put on clean pajamas    Additional instructions for the day of surgery: If you choose, you may shower the morning of surgery with an antibacterial soap.  DO  NOT APPLY any lotions, deodorants, cologne, or perfumes.   Do not wear jewelry or makeup Do not wear nail polish, gel polish, artificial nails, or any other type of covering on natural nails (fingers and toes) Do not bring valuables to the hospital. [facility] is not responsible for valuables/personal belongings. Put on clean/comfortable clothes.  Please brush your teeth.  Ask your nurse before applying any prescription medications to the skin.

## 2024-07-17 ENCOUNTER — Encounter (HOSPITAL_COMMUNITY)
Admission: RE | Admit: 2024-07-17 | Discharge: 2024-07-17 | Disposition: A | Discharge status: Home / Self Care | Source: Ambulatory Visit | Attending: Vascular Surgery | Admitting: Vascular Surgery

## 2024-07-17 ENCOUNTER — Other Ambulatory Visit: Payer: Self-pay

## 2024-07-17 ENCOUNTER — Encounter (HOSPITAL_COMMUNITY): Payer: Self-pay

## 2024-07-17 VITALS — BP 141/66 | HR 70 | Temp 98.7°F | Resp 18 | Ht 65.0 in | Wt 305.7 lb

## 2024-07-17 DIAGNOSIS — Z86711 Personal history of pulmonary embolism: Secondary | ICD-10-CM | POA: Diagnosis not present

## 2024-07-17 DIAGNOSIS — Z7901 Long term (current) use of anticoagulants: Secondary | ICD-10-CM | POA: Diagnosis not present

## 2024-07-17 DIAGNOSIS — R7303 Prediabetes: Secondary | ICD-10-CM | POA: Insufficient documentation

## 2024-07-17 DIAGNOSIS — Z8673 Personal history of transient ischemic attack (TIA), and cerebral infarction without residual deficits: Secondary | ICD-10-CM | POA: Diagnosis not present

## 2024-07-17 DIAGNOSIS — Z01818 Encounter for other preprocedural examination: Secondary | ICD-10-CM

## 2024-07-17 DIAGNOSIS — Z01812 Encounter for preprocedural laboratory examination: Secondary | ICD-10-CM | POA: Insufficient documentation

## 2024-07-17 LAB — SURGICAL PCR SCREEN
MRSA, PCR: NEGATIVE
Staphylococcus aureus: NEGATIVE

## 2024-07-17 LAB — HEMOGLOBIN A1C
Hgb A1c MFr Bld: 4.8 % (ref 4.8–5.6)
Mean Plasma Glucose: 91.06 mg/dL

## 2024-07-17 NOTE — Progress Notes (Addendum)
 PCP - Health, [address]  Cardiologist - Branch, Ronal BRAVO, MD (LOV 04-24-23)  Electrophysiologist:  Eulas BRAVO Furbish, MD  (LOV 03-03-24 Follow up needed)  PPM/ICD - denies Device Orders - n/a Rep Notified - n/a  Chest x-ray - 07-05-24 EKG - 07-05-24 Stress Test - 02-07-18 (CE) ECHO - 08-14-22 Cardiac Cath - 2017  Sleep Study - 06-19-22 CPAP - uses nightly  Fasting Blood Sugar -  Per patient she checks blood sugar when she feels dizzy. She reports it is low around 68-70   Last dose of GLP1 agonist-  MOUNJARO last dose 07-14-24 GLP1 instructions: yes patient is hold dose on Tuesday till after her procedure  Blood Thinner Instructions:hold Xarelto  48 hours Last dose 07-17-24 Aspirin  Instructions:denies  ERAS Protcol -npo PRE-SURGERY Ensure or G2-   COVID TEST- n/a  Anesthesia review: Yes hx of CHF, A -fib,, HTN, CVA DM, PE -IVC filter.  Continues with predisone from ED visit on 07-05-24 Mild intermittent asthma with exacerbation   Patient denies shortness of breath, fever, cough and chest pain at PAT appointment   All instructions explained to the patient, with a verbal understanding of the material. Patient agrees to go over the instructions while at home for a better understanding. Patient also instructed to self quarantine after being tested for COVID-19. The opportunity to ask questions was provided.

## 2024-07-20 ENCOUNTER — Telehealth: Payer: Self-pay

## 2024-07-20 NOTE — Anesthesia Preprocedure Evaluation (Signed)
 Anesthesia Evaluation  Patient identified by MRN, date of birth, ID band Patient awake    Reviewed: Allergy & Precautions, NPO status , Patient's Chart, lab work & pertinent test results  History of Anesthesia Complications Negative for: history of anesthetic complications  Airway Mallampati: II  TM Distance: >3 FB Neck ROM: Full    Dental no notable dental hx. (+) Poor Dentition, Missing   Pulmonary asthma , sleep apnea and Continuous Positive Airway Pressure Ventilation , neg COPD, Patient abstained from smoking.Not current smoker   Pulmonary exam normal breath sounds clear to auscultation       Cardiovascular Exercise Tolerance: Good METShypertension, Pt. on medications (-) CAD and (-) Past MI (-) dysrhythmias  Rhythm:Regular Rate:Normal - Systolic murmurs    Neuro/Psych Seizures -, Well Controlled,  PSYCHIATRIC DISORDERS Anxiety Depression    CVA, No Residual Symptoms    GI/Hepatic ,GERD  Controlled,,(+)     (-) substance abuse    Endo/Other  diabetes  Class 4 obesityOn daily oral GLP1, held appropriately  Renal/GU negative Renal ROS     Musculoskeletal   Abdominal  (+) + obese  Peds  Hematology   Anesthesia Other Findings Past Medical History: 01/2019: (HFpEF) heart failure with preserved ejection fraction (HCC)     Comment:  a.) Dx'd in [place] , [place]; b.) TTE 08/14/2022: EF               60-65%, mod LVH, mild LAE, triv MR No date: Anxiety No date: Asthma 02/16/2019: Atrial fibrillation and flutter (HCC)     Comment:  a.) CHA2DS2VASc = 6 (sex, HFpEF, HTN, CVA x2, T2DM);                b.) s/p DCCV (200 J x 1) 08/22/2022; c.) s/p RF ablation               (CTI) 11/02/2022; d.) rate/rhythm maintained on oral               flecainide  + metoprolol  succinate; chronically               anticoagulated with rivaroxaban  No date: Class 3 severe obesity due to excess calories with body mass  index (BMI) of 50.0 to  59.9 in adult [facility]) No date: DDD (degenerative disc disease), lumbar No date: GERD (gastroesophageal reflux disease) No date: Hiatal hernia No date: History of head injury No date: HTN (hypertension) No date: Hypercholesterolemia No date: Ileus (HCC) 11/16/2020: Intractable nausea and vomiting No date: Long term current use of anticoagulant     Comment:  a.) rivaroxaban  No date: Macular degeneration of left eye No date: MDD (major depressive disorder) No date: OA (osteoarthritis) No date: OSA on CPAP No date: Pneumonia No date: Presence of IVC filter No date: PTSD (post-traumatic stress disorder) No date: Recurrent pulmonary emboli (HCC)     Comment:  a.) s/p IVC filter placement 2003; b.) previously               followed by hematology when living in [place] No date: Seizure disorder (HCC)     Comment:  a.) last was in 2020 per pt report No date: Stroke [facility])     Comment:  1989 and 1995 (left sided weakness) No date: T2DM (type 2 diabetes mellitus) (HCC) No date: Vitamin D deficiency  Reproductive/Obstetrics                              Anesthesia  Physical Anesthesia Plan  ASA: 3  Anesthesia Plan: General   Post-op Pain Management: Minimal or no pain anticipated   Induction: Intravenous  PONV Risk Score and Plan: 3 and Ondansetron , Dexamethasone , Midazolam  and Treatment may vary due to age or medical condition  Airway Management Planned: LMA  Additional Equipment: None  Intra-op Plan:   Post-operative Plan: Extubation in OR  Informed Consent: I have reviewed the patients History and Physical, chart, labs and discussed the procedure including the risks, benefits and alternatives for the proposed anesthesia with the patient or authorized representative who has indicated his/her understanding and acceptance.     Dental advisory given  Plan Discussed with: CRNA and Surgeon  Anesthesia Plan Comments: (PAT note by Lynwood Hope,  PA-C: 58 year old female follows with cardiology for history of multiple PEs now s/p IVC filter and on lifelong Xarelto , stroke, HF with recovered EF, atrial fibrillation/flutter s/p ablation 10/2022 without known recurrence.  Echo 07/2022 with LVEF 60 to 65%, normal RV, no significant valvular abnormalities.  Myocardial perfusion scan 11/2021 normal without evidence of ischemia.  Last seen in follow-up by Dr. Nancey on 03/03/2024, stable at that time, no changes to management.  Other pertinent history includes OSA on CPAP, GAD/PTSD, asthma, GERD on PPI, seizures (not on any AEDs, normal EEG 11/2020), class IV obesity BMI 50  Patient reports last dose of Mounjaro 07/06/2024.  Last dose Xarelto  07/17/2024.  Preop labs reviewed, unremarkable.  EKG 07/05/2024: Sinus rhythm.  Rate 80. Prolonged PR interval. Nonspecific T wave abnormality  CHEST - 2 VIEW 07/05/2024:  COMPARISON:  03/09/2024  FINDINGS: Lungs are somewhat hypoinflated as a lordotic technique is demonstrated on the frontal image. No evidence of focal lobar consolidation or effusion. Minimal prominence of the central pulmonary vasculature likely due to the degree of hypoinflation. Borderline stable cardiomegaly. Remainder of the exam is unchanged.  IMPRESSION: 1. Hypoinflation without acute cardiopulmonary disease. 2. Borderline stable cardiomegaly.  TTE 08/14/2022: 1. Left ventricular ejection fraction, by estimation, is 60 to 65%. The  left ventricle has normal function. The left ventricle has no regional  wall motion abnormalities. There is moderate left ventricular hypertrophy.  Left ventricular diastolic  parameters are indeterminate.  2. Right ventricular systolic function is normal. The right ventricular  size is normal.  3. Left atrial size was mildly dilated.  4. The mitral valve is normal in structure. Trivial mitral valve  regurgitation. No evidence of mitral stenosis.  5. The aortic valve is tricuspid.  Aortic valve regurgitation is not  visualized. No aortic stenosis is present.  6. The inferior vena cava is normal in size with greater than 50%  respiratory variability, suggesting right atrial pressure of 3 mmHg.   Myocardial perfusion scan 12/11/2021 (copy on chart): 1.  Normal myocardial perfusion study without evidence of inducible ischemia. 2.  The poststress left ventricle is normal in size. 3.  The poststress ejection fraction 57%. 4.  Normal symptomatic and ECG response to pharmacologic stress. 5.  No prior study available for comparison.   )        Anesthesia Quick Evaluation

## 2024-07-20 NOTE — Progress Notes (Signed)
 Anesthesia Chart Review:  58 year old female follows with cardiology for history of multiple PEs now s/p IVC filter and on lifelong Xarelto , stroke, HF with recovered EF, atrial fibrillation/flutter s/p ablation 10/2022 without known recurrence.  Echo 07/2022 with LVEF 60 to 65%, normal RV, no significant valvular abnormalities.  Myocardial perfusion scan 11/2021 normal without evidence of ischemia.  Last seen in follow-up by Dr. Nancey on 03/03/2024, stable at that time, no changes to management.  Other pertinent history includes OSA on CPAP, GAD/PTSD, asthma, GERD on PPI, seizures (not on any AEDs, normal EEG 11/2020), class IV obesity BMI 50  Patient reports last dose of Mounjaro 07/06/2024.  Last dose Xarelto  07/17/2024.  Preop labs reviewed, unremarkable.  EKG 07/05/2024: Sinus rhythm.  Rate 80. Prolonged PR interval. Nonspecific T wave abnormality  CHEST - 2 VIEW 07/05/2024:   COMPARISON:  03/09/2024   FINDINGS: Lungs are somewhat hypoinflated as a lordotic technique is demonstrated on the frontal image. No evidence of focal lobar consolidation or effusion. Minimal prominence of the central pulmonary vasculature likely due to the degree of hypoinflation. Borderline stable cardiomegaly. Remainder of the exam is unchanged.   IMPRESSION: 1. Hypoinflation without acute cardiopulmonary disease. 2. Borderline stable cardiomegaly.  TTE 08/14/2022: 1. Left ventricular ejection fraction, by estimation, is 60 to 65%. The  left ventricle has normal function. The left ventricle has no regional  wall motion abnormalities. There is moderate left ventricular hypertrophy.  Left ventricular diastolic  parameters are indeterminate.   2. Right ventricular systolic function is normal. The right ventricular  size is normal.   3. Left atrial size was mildly dilated.   4. The mitral valve is normal in structure. Trivial mitral valve  regurgitation. No evidence of mitral stenosis.   5. The aortic  valve is tricuspid. Aortic valve regurgitation is not  visualized. No aortic stenosis is present.   6. The inferior vena cava is normal in size with greater than 50%  respiratory variability, suggesting right atrial pressure of 3 mmHg.   Myocardial perfusion scan 12/11/2021 (copy on chart): 1.  Normal myocardial perfusion study without evidence of inducible ischemia. 2.  The poststress left ventricle is normal in size. 3.  The poststress ejection fraction 57%. 4.  Normal symptomatic and ECG response to pharmacologic stress. 5.  No prior study available for comparison.     Lynwood Geofm RIGGERS [facility] Short Stay Center/Anesthesiology Phone [phone] 07/20/2024 10:23 AM

## 2024-07-20 NOTE — Telephone Encounter (Signed)
 Attempted to call to inform of surgical arrival time change.  Report to [facility] at 7:00a 11/4

## 2024-07-21 ENCOUNTER — Encounter (HOSPITAL_COMMUNITY): Payer: Self-pay | Admitting: Vascular Surgery

## 2024-07-21 ENCOUNTER — Ambulatory Visit (HOSPITAL_COMMUNITY): Payer: Self-pay | Admitting: Vascular Surgery

## 2024-07-21 ENCOUNTER — Ambulatory Visit (HOSPITAL_BASED_OUTPATIENT_CLINIC_OR_DEPARTMENT_OTHER)

## 2024-07-21 ENCOUNTER — Ambulatory Visit (HOSPITAL_COMMUNITY)
Admission: RE | Admit: 2024-07-21 | Discharge: 2024-07-21 | Disposition: A | Discharge status: Home / Self Care | Attending: Vascular Surgery | Admitting: Vascular Surgery

## 2024-07-21 ENCOUNTER — Other Ambulatory Visit: Payer: Self-pay

## 2024-07-21 ENCOUNTER — Encounter (HOSPITAL_COMMUNITY)
Admission: RE | Disposition: A | Discharge status: Home / Self Care | Payer: Self-pay | Source: Home / Self Care | Attending: Vascular Surgery

## 2024-07-21 DIAGNOSIS — F329 Major depressive disorder, single episode, unspecified: Secondary | ICD-10-CM | POA: Diagnosis not present

## 2024-07-21 DIAGNOSIS — Z7985 Long-term (current) use of injectable non-insulin antidiabetic drugs: Secondary | ICD-10-CM | POA: Insufficient documentation

## 2024-07-21 DIAGNOSIS — F419 Anxiety disorder, unspecified: Secondary | ICD-10-CM | POA: Diagnosis not present

## 2024-07-21 DIAGNOSIS — E119 Type 2 diabetes mellitus without complications: Secondary | ICD-10-CM | POA: Diagnosis not present

## 2024-07-21 DIAGNOSIS — Z79899 Other long term (current) drug therapy: Secondary | ICD-10-CM | POA: Diagnosis not present

## 2024-07-21 DIAGNOSIS — I5032 Chronic diastolic (congestive) heart failure: Secondary | ICD-10-CM | POA: Insufficient documentation

## 2024-07-21 DIAGNOSIS — J45909 Unspecified asthma, uncomplicated: Secondary | ICD-10-CM | POA: Insufficient documentation

## 2024-07-21 DIAGNOSIS — Z8673 Personal history of transient ischemic attack (TIA), and cerebral infarction without residual deficits: Secondary | ICD-10-CM | POA: Diagnosis not present

## 2024-07-21 DIAGNOSIS — I110 Hypertensive heart disease with heart failure: Secondary | ICD-10-CM | POA: Insufficient documentation

## 2024-07-21 DIAGNOSIS — R519 Headache, unspecified: Secondary | ICD-10-CM

## 2024-07-21 DIAGNOSIS — G4733 Obstructive sleep apnea (adult) (pediatric): Secondary | ICD-10-CM | POA: Diagnosis not present

## 2024-07-21 DIAGNOSIS — M316 Other giant cell arteritis: Secondary | ICD-10-CM

## 2024-07-21 DIAGNOSIS — I4891 Unspecified atrial fibrillation: Secondary | ICD-10-CM | POA: Diagnosis not present

## 2024-07-21 DIAGNOSIS — E6689 Other obesity not elsewhere classified: Secondary | ICD-10-CM | POA: Diagnosis not present

## 2024-07-21 DIAGNOSIS — Z6841 Body Mass Index (BMI) 40.0 and over, adult: Secondary | ICD-10-CM | POA: Diagnosis not present

## 2024-07-21 HISTORY — PX: ARTERY BIOPSY: SHX891

## 2024-07-21 LAB — GLUCOSE, CAPILLARY
Glucose-Capillary: 85 mg/dL (ref 70–99)
Glucose-Capillary: 77 mg/dL (ref 70–99)
Glucose-Capillary: 104 mg/dL — ABNORMAL HIGH (ref 70–99)

## 2024-07-21 LAB — POCT I-STAT, CHEM 8
BUN: 10 mg/dL (ref 6–20)
Calcium, Ion: 1.34 mmol/L (ref 1.15–1.40)
Chloride: 105 mmol/L (ref 98–111)
Creatinine, Ser: 0.9 mg/dL (ref 0.44–1.00)
Glucose, Bld: 92 mg/dL (ref 70–99)
HCT: 34 % — ABNORMAL LOW (ref 36.0–46.0)
Hemoglobin: 11.6 g/dL — ABNORMAL LOW (ref 12.0–15.0)
Potassium: 3.9 mmol/L (ref 3.5–5.1)
Sodium: 139 mmol/L (ref 135–145)
TCO2: 26 mmol/L (ref 22–32)

## 2024-07-21 SURGERY — BIOPSY TEMPORAL ARTERY
Anesthesia: General | Laterality: Left

## 2024-07-21 MED ORDER — OXYCODONE HCL 5 MG PO TABS: 5.0000 mg | ORAL_TABLET | Freq: Once | ORAL | Status: DC | PRN

## 2024-07-21 MED ORDER — FENTANYL CITRATE (PF) 100 MCG/2ML IJ SOLN
INTRAMUSCULAR | Status: AC
  Filled 2024-07-21: qty 2

## 2024-07-21 MED ORDER — MIDAZOLAM HCL (PF) 2 MG/2ML IJ SOLN
INTRAMUSCULAR | Status: DC | PRN
  Administered 2024-07-21: 2 mg via INTRAVENOUS

## 2024-07-21 MED ORDER — 0.9 % SODIUM CHLORIDE (POUR BTL) OPTIME
TOPICAL | Status: DC | PRN
  Administered 2024-07-21: 1000 mL

## 2024-07-21 MED ORDER — CHLORHEXIDINE GLUCONATE 4 % EX SOLN: 60.0000 mL | Freq: Once | CUTANEOUS | Status: DC

## 2024-07-21 MED ORDER — CEFAZOLIN SODIUM-DEXTROSE 3-4 GM/150ML-% IV SOLN
3.0000 g | INTRAVENOUS | Status: AC
  Administered 2024-07-21: 3 g via INTRAVENOUS
  Filled 2024-07-21: qty 150

## 2024-07-21 MED ORDER — MEPERIDINE HCL 25 MG/ML IJ SOLN: 6.2500 mg | INTRAMUSCULAR | Status: DC | PRN

## 2024-07-21 MED ORDER — RIVAROXABAN 20 MG PO TABS: 20.0000 mg | ORAL_TABLET | Freq: Every day | ORAL | Status: AC

## 2024-07-21 MED ORDER — HYDROMORPHONE HCL 1 MG/ML IJ SOLN: 0.2500 mg | INTRAMUSCULAR | Status: DC | PRN

## 2024-07-21 MED ORDER — CHLORHEXIDINE GLUCONATE 0.12 % MT SOLN
15.0000 mL | Freq: Once | OROMUCOSAL | Status: AC
  Administered 2024-07-21: 15 mL via OROMUCOSAL
  Filled 2024-07-21: qty 15

## 2024-07-21 MED ORDER — SODIUM CHLORIDE 0.9 % IV SOLN: INTRAVENOUS | Status: DC

## 2024-07-21 MED ORDER — LIDOCAINE HCL (PF) 1 % IJ SOLN
INTRAMUSCULAR | Status: AC
  Filled 2024-07-21: qty 30

## 2024-07-21 MED ORDER — PROPOFOL 10 MG/ML IV BOLUS
INTRAVENOUS | Status: DC | PRN
  Administered 2024-07-21: 200 mg via INTRAVENOUS
  Administered 2024-07-21: 100 mg via INTRAVENOUS

## 2024-07-21 MED ORDER — ONDANSETRON HCL 4 MG/2ML IJ SOLN
INTRAMUSCULAR | Status: DC | PRN
  Administered 2024-07-21: 4 mg via INTRAVENOUS

## 2024-07-21 MED ORDER — DEXAMETHASONE SOD PHOSPHATE PF 10 MG/ML IJ SOLN
INTRAMUSCULAR | Status: DC | PRN
  Administered 2024-07-21: 10 mg via INTRAVENOUS

## 2024-07-21 MED ORDER — LIDOCAINE 2% (20 MG/ML) 5 ML SYRINGE
INTRAMUSCULAR | Status: DC | PRN
Start: 2024-07-21 — End: 2024-07-21
  Administered 2024-07-21: 80 mg via INTRAVENOUS

## 2024-07-21 MED ORDER — MIDAZOLAM HCL 2 MG/2ML IJ SOLN
INTRAMUSCULAR | Status: AC
  Filled 2024-07-21: qty 2

## 2024-07-21 MED ORDER — PROPOFOL 10 MG/ML IV BOLUS
INTRAVENOUS | Status: AC
  Filled 2024-07-21: qty 20

## 2024-07-21 MED ORDER — LACTATED RINGERS IV SOLN: INTRAVENOUS | Status: DC

## 2024-07-21 MED ORDER — FENTANYL CITRATE (PF) 250 MCG/5ML IJ SOLN
INTRAMUSCULAR | Status: DC | PRN
  Administered 2024-07-21: 50 ug via INTRAVENOUS
  Administered 2024-07-21: 100 ug via INTRAVENOUS
  Administered 2024-07-21: 50 ug via INTRAVENOUS

## 2024-07-21 MED ORDER — AMISULPRIDE (ANTIEMETIC) 5 MG/2ML IV SOLN: 10.0000 mg | Freq: Once | INTRAVENOUS | Status: DC | PRN

## 2024-07-21 MED ORDER — ORAL CARE MOUTH RINSE: 15.0000 mL | Freq: Once | OROMUCOSAL | Status: AC

## 2024-07-21 MED ORDER — OXYCODONE HCL 5 MG/5ML PO SOLN: 5.0000 mg | Freq: Once | ORAL | Status: DC | PRN

## 2024-07-21 SURGICAL SUPPLY — 35 items
BAG COUNTER SPONGE SURGICOUNT (BAG) ×1 IMPLANT
CANISTER SUCTION 3000ML PPV (SUCTIONS) ×1 IMPLANT
CLIP TI MEDIUM 6 (CLIP) IMPLANT
CLIP TI WIDE RED SMALL 6 (CLIP) IMPLANT
CNTNR URN SCR LID CUP LEK RST (MISCELLANEOUS) ×1 IMPLANT
COTTONBALL LRG STERILE PKG (GAUZE/BANDAGES/DRESSINGS) ×1 IMPLANT
COVER PROBE W GEL 5X96 (DRAPES) IMPLANT
COVER SURGICAL LIGHT HANDLE (MISCELLANEOUS) ×1 IMPLANT
DERMABOND ADVANCED .7 DNX12 (GAUZE/BANDAGES/DRESSINGS) ×1 IMPLANT
DRAPE OPHTHALMIC 77X100 STRL (CUSTOM PROCEDURE TRAY) ×1 IMPLANT
ELECT NDL BLADE 2-5/6 (NEEDLE) ×1 IMPLANT
ELECT NEEDLE BLADE 2-5/6 (NEEDLE) ×1 IMPLANT
ELECTRODE REM PT RTRN 9FT ADLT (ELECTROSURGICAL) ×1 IMPLANT
GAUZE 4X4 16PLY ~~LOC~~+RFID DBL (SPONGE) ×1 IMPLANT
GEL ULTRASOUND 8.5O AQUASONIC (MISCELLANEOUS) ×1 IMPLANT
GLOVE BIO SURGEON STRL SZ7.5 (GLOVE) ×1 IMPLANT
GOWN STRL REUS W/ TWL LRG LVL3 (GOWN DISPOSABLE) ×1 IMPLANT
GOWN STRL REUS W/ TWL XL LVL3 (GOWN DISPOSABLE) ×1 IMPLANT
KIT BASIN OR (CUSTOM PROCEDURE TRAY) ×1 IMPLANT
KIT TURNOVER KIT B (KITS) ×1 IMPLANT
NDL HYPO 25GX1X1/2 BEV (NEEDLE) ×1 IMPLANT
NEEDLE HYPO 25GX1X1/2 BEV (NEEDLE) ×1 IMPLANT
PACK GENERAL/GYN (CUSTOM PROCEDURE TRAY) ×1 IMPLANT
PAD ARMBOARD POSITIONER FOAM (MISCELLANEOUS) ×2 IMPLANT
POWDER SURGICEL 3.0 GRAM (HEMOSTASIS) IMPLANT
SOLN 0.9% NACL POUR BTL 1000ML (IV SOLUTION) ×1 IMPLANT
SOLN STERILE WATER BTL 1000 ML (IV SOLUTION) ×1 IMPLANT
SUCTION TUBE FRAZIER 10FR DISP (SUCTIONS) ×1 IMPLANT
SUT MNCRL AB 4-0 PS2 18 (SUTURE) ×1 IMPLANT
SUT PROLENE 6 0 BV (SUTURE) IMPLANT
SUT SILK 3-0 18XBRD TIE 12 (SUTURE) IMPLANT
SUT VIC AB 3-0 SH 27X BRD (SUTURE) ×1 IMPLANT
SYR CONTROL 10ML LL (SYRINGE) ×1 IMPLANT
TOWEL GREEN STERILE (TOWEL DISPOSABLE) ×1 IMPLANT
VASCULAR TIE MINI RED 18IN STL (MISCELLANEOUS) ×1 IMPLANT

## 2024-07-21 NOTE — Anesthesia Procedure Notes (Addendum)
 Procedure Name: LMA Insertion Date/Time: 07/21/2024 10:16 AM  Performed by: Delores Duwaine SAUNDERS, CRNAPre-anesthesia Checklist: Patient identified, Emergency Drugs available, Suction available and Patient being monitored Patient Re-evaluated:Patient Re-evaluated prior to induction Oxygen Delivery Method: Circle System Utilized Preoxygenation: Pre-oxygenation with 100% oxygen Induction Type: IV induction Ventilation: Mask ventilation without difficulty LMA: LMA inserted LMA Size: 5.0 Number of attempts: 2 (first attempt size 4 LMA with significant leak and poor tidal volumes. Easy placement of size 5 LMA with resolved leak) Airway Equipment and Method: Bite block Placement Confirmation: positive ETCO2 Tube secured with: Tape Dental Injury: Teeth and Oropharynx as per pre-operative assessment

## 2024-07-21 NOTE — Interval H&P Note (Addendum)
 History and Physical Interval Note:  07/21/2024 7:29 AM  Meghan [name]  has presented today for surgery, with the diagnosis of GIANT CELL ARTERITIS.  The various methods of treatment have been discussed with the patient and family. After consideration of risks, benefits and other options for treatment, the patient has consented to  Procedure(s): BIOPSY TEMPORAL ARTERY (Left) as a surgical intervention.  The patient's history has been reviewed, patient examined, no change in status, stable for surgery.  I have reviewed the patient's chart and labs.  Questions were answered to the patient's satisfaction.     [place]   ADDENDUM: I have independently interviewed and examined the patient. I agree with the above.   Norman GORMAN Serve MD [facility] Phone Number: [phone] 07/21/2024 9:14 AM

## 2024-07-21 NOTE — Op Note (Signed)
    OPERATIVE NOTE  PROCEDURE:   Left temporal artery biopsy  PRE-OPERATIVE DIAGNOSIS: headaches  POST-OPERATIVE DIAGNOSIS: same as above   SURGEON: Norman GORMAN Serve MD  ASSISTANT(S): none  ANESTHESIA: general  ESTIMATED BLOOD LOSS: 10 cc  FINDING(S): Small temporal artery with multiple bracnhes distally  SPECIMEN(S):  left temporal artery  INDICATIONS:   Meghan [name] is a 58 y.o. female with concern for left temporal headache secondary to giant cell arteritis. We discussed the procedural details as well as risks and benefits of proceeding with left temporal artery biopsy and she elected to proceed.  DESCRIPTION: Patient is resting operating room and positioned supine on the operating table.  General anesthesia was induced and the left temporal region of the head was prepped and draped in usual sterile fashion.  Preoperative antibiotics were administered and timeout was performed. We started with mapping the left temporal artery with a Doppler.  A oblique incision was made overlying the artery.  This was carried deeper electrocautery until visualizing the anterior surface of the temporal artery.  The artery was then dissected free circumferentially throughout the wound and again inspected with Doppler to ensure that this was the artery.  The artery was ligated proximally and distally between silk ties and transected.  A specimen of approximately 3 cm was then passed off the field.  The wound was then copiously irrigated and hemostasis was achieved with electrocautery.  She tolerated procedure well, all counts were correct and she was transferred to recovery in stable condition.    COMPLICATIONS: none  CONDITION: stable  Norman GORMAN Serve MD [facility] Phone Number: [phone] 07/21/2024 12:39 PM

## 2024-07-21 NOTE — Transfer of Care (Signed)
 Immediate Anesthesia Transfer of Care Note  Patient: Meghan [name]  Procedure(s) Performed: BIOPSY TEMPORAL ARTERY (Left)  Patient Location: PACU  Anesthesia Type:General  Level of Consciousness: awake and drowsy  Airway & Oxygen Therapy: Patient Spontanous Breathing  Post-op Assessment: Report given to RN  Post vital signs: Reviewed and stable  Last Vitals:  Vitals Value Taken Time  BP 151/78 07/21/24 11:45  Temp 36.8 C 07/21/24 11:48  Pulse 71 07/21/24 11:48  Resp 13 07/21/24 11:48  SpO2 96 % 07/21/24 11:48    Last Pain:  Vitals:   07/21/24 1115  TempSrc:   PainSc: Asleep      Patients Stated Pain Goal: 3 (07/21/24 0753)  Complications: No notable events documented.

## 2024-07-21 NOTE — Progress Notes (Unsigned)
 Office Visit Note  Patient: Meghan [name]             Date of Birth: 01-10-1966           MRN: [id number]             PCP: [facility] Referring: Elizbeth Leita Ruth, * Visit Date: 07/22/2024 Occupation: Data Unavailable  Subjective:  No chief complaint on file.   History of Present Illness: Meghan [name] is a 58 y.o. female ***     Activities of Daily Living:  Patient reports morning stiffness for 10-15 minutes.   Patient Reports nocturnal pain.  Difficulty dressing/grooming: Denies Difficulty climbing stairs: Reports Difficulty getting out of chair: Denies Difficulty using hands for taps, buttons, cutlery, and/or writing: Reports  Review of Systems  Constitutional:  Negative for fatigue.  HENT:  Positive for mouth dryness. Negative for mouth sores.   Eyes:  Negative for dryness.  Respiratory:  Negative for shortness of breath.   Cardiovascular:  Negative for chest pain and palpitations.  Gastrointestinal:  Positive for constipation. Negative for blood in stool and diarrhea.  Endocrine: Negative for increased urination.  Genitourinary:  Negative for involuntary urination.  Musculoskeletal:  Positive for joint pain, joint pain and morning stiffness. Negative for gait problem, joint swelling, myalgias, muscle weakness, muscle tenderness and myalgias.  Skin:  Negative for color change, rash, hair loss and sensitivity to sunlight.  Allergic/Immunologic: Negative for susceptible to infections.  Neurological:  Positive for headaches. Negative for dizziness.  Hematological:  Negative for swollen glands.  Psychiatric/Behavioral:  Negative for depressed mood and sleep disturbance. The patient is nervous/anxious.     PMFS History:  Patient Active Problem List   Diagnosis Date Noted   Normocytic anemia 07/01/2024   Vascular complication 11/07/2022   MDD (major depressive disorder), recurrent episode, moderate (HCC) 07/30/2022   GAD (generalized anxiety disorder)  07/30/2022   PTSD (post-traumatic stress disorder) 07/30/2022   Insomnia 05/24/2022   Arthritis of first metatarsophalangeal (MTP) joint of right foot 04/25/2022   Intractable nausea and vomiting 11/16/2020   Ileus (HCC) 11/14/2020   Fecal impaction (HCC) 11/14/2020   Left-sided weakness 09/29/2020   Asthma 09/29/2020   Pre-diabetes 09/29/2020   Pain in left finger(s) 07/12/2020   Seizure-like activity (HCC) 03/25/2020   History of head injury 03/25/2020   Obstructive sleep apnea syndrome 09/04/2019   Acute medial meniscus tear of right knee 07/10/2019   Class 3 severe obesity due to excess calories with serious comorbidity and body mass index (BMI) of 50.0 to 59.9 in adult (HCC) 06/01/2019   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   Atrial fibrillation (HCC) 04/17/2019   History of pulmonary embolus (PE) 04/17/2019    Past Medical History:  Diagnosis Date   (HFpEF) heart failure with preserved ejection fraction (HCC) 01/2019   a.) Dx'd in [place] , [place]; b.) TTE 08/14/2022: EF 60-65%, mod LVH, mild LAE, triv MR   Anxiety    Asthma 05/69   Atrial fibrillation and flutter (HCC) 02/16/2019   a.) CHA2DS2VASc = 6 (sex, HFpEF, HTN, CVA x2, T2DM);  b.) s/p DCCV (200 J x 1) 08/22/2022; c.) s/p RF ablation (CTI) 11/02/2022; d.) rate/rhythm maintained on oral flecainide  + metoprolol  succinate; chronically anticoagulated with rivaroxaban    CHF (congestive heart failure) (HCC) 01/2019   Class 3 severe obesity due to excess calories with body mass index (BMI) of 50.0 to 59.9 in adult [facility])    DDD (degenerative disc disease), lumbar    Dysrhythmia  A. Fib   GERD (gastroesophageal reflux disease)    Heart murmur 07/2017   Hiatal hernia    History of head injury    HTN (hypertension)    Hypercholesterolemia    Ileus (HCC)    Intractable nausea and vomiting 11/16/2020   Long term current use of anticoagulant    a.) rivaroxaban    Macular degeneration of left eye    MDD (major depressive  disorder)    OA (osteoarthritis)    OSA on CPAP    Pneumonia    Presence of IVC filter    PTSD (post-traumatic stress disorder)    Recurrent pulmonary emboli (HCC)    a.) s/p IVC filter placement 2003; b.) previously followed by hematology when living in [place] , [place]   Seizure disorder (HCC)    a.) last was in 2020 per pt report   Stroke (HCC) 02/1995   1989 and 1995 (left sided weakness)   T2DM (type 2 diabetes mellitus) (HCC)    Vitamin D deficiency     Family History  Problem Relation Age of Onset   Breast cancer Mother    Heart attack Father    Diabetes Father    Congestive Heart Failure Father    Clotting disorder Father    Heart disease Father    Heart failure Father    Hypertension Father    Heart failure Brother    Breast cancer Maternal Grandmother    Arrhythmia Paternal Grandmother    Past Surgical History:  Procedure Laterality Date   A-FLUTTER ABLATION N/A 11/02/2022   Procedure: A-FLUTTER ABLATION;  Surgeon: Mealor, Eulas BRAVO, MD;  Location: [facility];  Service: Cardiovascular;  Laterality: N/A;   ARTHRODESIS METATARSALPHALANGEAL JOINT (MTPJ) Left 02/25/2023   Procedure: ARTHRODESIS METATARSALPHALANGEAL JOINT (MTPJ);  Surgeon: Tobie Franky SQUIBB, DPM;  Location: [facility] ORS;  Service: Podiatry;  Laterality: Left;   CARDIAC CATHETERIZATION  2017   in [facility] in [place]   CARDIOVERSION N/A 08/22/2022   Procedure: CARDIOVERSION;  Surgeon: Sheena Pugh, DO;  Location: [facility];  Service: Cardiovascular;  Laterality: N/A;   CESAREAN SECTION N/A 1989   CESAREAN SECTION N/A 1993   CHOLECYSTECTOMY  2003   COLONOSCOPY WITH PROPOFOL  N/A 06/05/2019   Procedure: COLONOSCOPY WITH PROPOFOL ;  Surgeon: Rollin Dover, MD;  Location: [facility];  Service: Endoscopy;  Laterality: N/A;   CYST REMOVAL WITH BONE GRAFT Left 10/18/2020   Procedure: BONE GRAFTING OF ENCHONDROMA MIDDLE PHANLANX OF LEFT MIDDLE FINGER;  Surgeon: Murrell Kuba, MD;  Location: [facility]  OR;  Service: Orthopedics;  Laterality: Left;  AXILLARY BLOCK   DIAGNOSTIC LAPAROSCOPY  2015; 2017   lap hernia repair x2   ENDOVENOUS ABLATION SAPHENOUS VEIN W/ LASER Left 08/31/2021   endovenous laser ablation left greater saphenous vein and stab phlebectomy 10-20 incisions left leg by Penne Colorado MD   HALLUX FUSION Right 05/28/2022   Procedure: HALLUX FUSION METATARSAL PHALANGEAL JOIT;  Surgeon: Tobie Franky SQUIBB, DPM;  Location: [facility];  Service: Podiatry;  Laterality: Right;  BLOCK   HERNIA REPAIR  2014, 2018   umbilical hernia repair   IVC FILTER INSERTION  2003   POLYPECTOMY  06/05/2019   Procedure: POLYPECTOMY;  Surgeon: Rollin Dover, MD;  Location: [facility];  Service: Endoscopy;;   SHOULDER ARTHROSCOPY W/ ROTATOR CUFF REPAIR Right 08/28/2017   TOTAL ABDOMINAL HYSTERECTOMY  2003   UPPER GI ENDOSCOPY     WRIST ARTHROSCOPY WITH DEBRIDEMENT Left 10/18/2020   Procedure: LEFT WRIST ARTHROSCOPY WITH  DEBRIDEMENT;  Surgeon: Murrell Kuba, MD;  Location: [facility] OR;  Service: Orthopedics;  Laterality: Left;  AXILLARY BLOCK   WRIST ARTHROSCOPY WITH DEBRIDEMENT Right 01/25/2022   Procedure: ARTHROSCOPY RIGHT WRIST WITH  DEBRIDEMENT/ SHRINKAGE;  Surgeon: Murrell Drivers, MD;  Location: [facility] OR;  Service: Orthopedics;  Laterality: Right;   Social History   Tobacco Use   Smoking status: Never    Passive exposure: Never   Smokeless tobacco: Never  Vaping Use   Vaping status: Never Used  Substance Use Topics   Alcohol use: Never   Drug use: Never   Social History   Social History Narrative   ** Merged History Encounter **         Immunization History  Administered Date(s) Administered   INFLUENZA, HIGH DOSE SEASONAL PF 05/22/2019   Influenza Split 05/22/2019, 07/18/2021   Influenza, Seasonal, Injecte, Preservative Fre 06/19/2023   Influenza-Unspecified 05/28/2019   PFIZER(Purple Top)SARS-COV-2 Vaccination 12/18/2019, 01/18/2020     Objective: Vital Signs: There  were no vitals taken for this visit.   Physical Exam   Musculoskeletal Exam: ***  CDAI Exam: CDAI Score: -- Patient Global: --; Provider Global: -- Swollen: --; Tender: -- Joint Exam 07/22/2024   No joint exam has been documented for this visit   There is currently no information documented on the homunculus. Go to the Rheumatology activity and complete the homunculus joint exam.  Investigation: No additional findings.  Imaging: DG Ankle Complete Left Result Date: 07/10/2024 Please see detailed radiograph report in office note.  DG Chest 2 View Result Date: 07/05/2024 CLINICAL DATA:  Shortness of breath and left-sided chest pain. EXAM: CHEST - 2 VIEW COMPARISON:  03/09/2024 FINDINGS: Lungs are somewhat hypoinflated as a lordotic technique is demonstrated on the frontal image. No evidence of focal lobar consolidation or effusion. Minimal prominence of the central pulmonary vasculature likely due to the degree of hypoinflation. Borderline stable cardiomegaly. Remainder of the exam is unchanged. IMPRESSION: 1. Hypoinflation without acute cardiopulmonary disease. 2. Borderline stable cardiomegaly. Electronically Signed   By: Toribio Agreste M.D.   On: 07/05/2024 11:12    Recent Labs: Lab Results  Component Value Date   WBC 5.9 07/14/2024   HGB 11.6 (L) 07/21/2024   PLT 238 07/14/2024   NA 139 07/21/2024   K 3.9 07/21/2024   CL 105 07/21/2024   CO2 31 07/14/2024   GLUCOSE 92 07/21/2024   BUN 10 07/21/2024   CREATININE 0.90 07/21/2024   BILITOT 0.3 07/14/2024   ALKPHOS 155 (H) 07/14/2024   AST 13 (L) 07/14/2024   ALT 11 07/14/2024   PROT 7.4 07/14/2024   ALBUMIN 3.9 07/14/2024   CALCIUM 10.3 07/14/2024   GFRAA >60 05/11/2020    Speciality Comments: No specialty comments available.  Procedures:  No procedures performed Allergies: Acetaminophen -codeine, Bee venom, Meloxicam, Tramadol, Codeine, Coconut (cocos nucifera), and Tomato   Assessment / Plan:     Visit  Diagnoses: No diagnosis found.  Orders: No orders of the defined types were placed in this encounter.  No orders of the defined types were placed in this encounter.   Face-to-face time spent with patient was *** minutes. Greater than 50% of time was spent in counseling and coordination of care.  Follow-Up Instructions: No follow-ups on file.   Alfonso Patterson, LPN  Note - This record has been created using [employer].  Chart creation errors have been sought, but may not always  have been located. Such creation errors do not reflect on  the standard  of medical care.

## 2024-07-22 ENCOUNTER — Ambulatory Visit

## 2024-07-22 VITALS — BP 110/71 | HR 78 | Temp 97.5°F | Resp 15 | Ht 65.0 in | Wt 307.4 lb

## 2024-07-22 DIAGNOSIS — M79671 Pain in right foot: Secondary | ICD-10-CM

## 2024-07-22 DIAGNOSIS — R700 Elevated erythrocyte sedimentation rate: Secondary | ICD-10-CM

## 2024-07-22 DIAGNOSIS — D689 Coagulation defect, unspecified: Secondary | ICD-10-CM

## 2024-07-22 DIAGNOSIS — R7982 Elevated C-reactive protein (CRP): Secondary | ICD-10-CM

## 2024-07-22 DIAGNOSIS — M791 Myalgia, unspecified site: Secondary | ICD-10-CM

## 2024-07-22 DIAGNOSIS — Z5321 Procedure and treatment not carried out due to patient leaving prior to being seen by health care provider: Secondary | ICD-10-CM

## 2024-07-22 LAB — SURGICAL PATHOLOGY

## 2024-07-22 NOTE — Progress Notes (Signed)
 This encounter was created in error - please disregard.

## 2024-07-22 NOTE — Anesthesia Postprocedure Evaluation (Signed)
 Anesthesia Post Note  Patient: Meghan [name]  Procedure(s) Performed: BIOPSY TEMPORAL ARTERY (Left)     Patient location during evaluation: PACU Anesthesia Type: General Level of consciousness: awake and alert Pain management: pain level controlled Vital Signs Assessment: post-procedure vital signs reviewed and stable Respiratory status: spontaneous breathing, nonlabored ventilation and respiratory function stable Cardiovascular status: blood pressure returned to baseline and stable Postop Assessment: no apparent nausea or vomiting Anesthetic complications: no   No notable events documented.  Last Vitals:  Vitals:   07/21/24 1145 07/21/24 1148  BP: (!) 151/78   Pulse: 72 71  Resp: 12 13  Temp:  36.8 C  SpO2: 97% 96%    Last Pain:  Vitals:   07/21/24 1115  TempSrc:   PainSc: Asleep                 Butler Levander Pinal

## 2024-07-25 ENCOUNTER — Ambulatory Visit
Admission: RE | Admit: 2024-07-25 | Discharge: 2024-07-25 | Disposition: A | Discharge status: Home / Self Care | Source: Ambulatory Visit | Attending: Podiatry | Admitting: Podiatry

## 2024-07-25 DIAGNOSIS — M1380 Other specified arthritis, unspecified site: Secondary | ICD-10-CM

## 2024-07-25 DIAGNOSIS — M65972 Unspecified synovitis and tenosynovitis, left ankle and foot: Secondary | ICD-10-CM

## 2024-07-27 ENCOUNTER — Ambulatory Visit

## 2024-07-27 VITALS — BP 136/85 | HR 76 | Temp 97.4°F | Resp 14 | Ht 65.0 in | Wt 306.6 lb

## 2024-07-27 DIAGNOSIS — M316 Other giant cell arteritis: Secondary | ICD-10-CM | POA: Diagnosis not present

## 2024-07-27 DIAGNOSIS — Z7952 Long term (current) use of systemic steroids: Secondary | ICD-10-CM

## 2024-07-27 DIAGNOSIS — M791 Myalgia, unspecified site: Secondary | ICD-10-CM

## 2024-07-27 DIAGNOSIS — M7918 Myalgia, other site: Secondary | ICD-10-CM

## 2024-07-27 DIAGNOSIS — B999 Unspecified infectious disease: Secondary | ICD-10-CM

## 2024-07-27 DIAGNOSIS — Z79899 Other long term (current) drug therapy: Secondary | ICD-10-CM | POA: Diagnosis not present

## 2024-07-27 DIAGNOSIS — R700 Elevated erythrocyte sedimentation rate: Secondary | ICD-10-CM

## 2024-07-27 DIAGNOSIS — M797 Fibromyalgia: Secondary | ICD-10-CM

## 2024-07-27 MED ORDER — PANTOPRAZOLE SODIUM 40 MG PO TBEC: 40.0000 mg | DELAYED_RELEASE_TABLET | Freq: Every day | ORAL | 0 refills | Status: AC

## 2024-07-27 MED ORDER — PREDNISONE 20 MG PO TABS: ORAL_TABLET | ORAL | 0 refills | Status: AC

## 2024-07-27 NOTE — Progress Notes (Unsigned)
 Office Visit Note  Patient: Meghan [name]             Date of Birth: [birth year]           MRN: [id number]             PCP: Health, [facility] Referring: Health, [facility] Visit Date: 07/27/2024 Occupation: @GUAROCC @  Subjective:  Results  Patient here for new patient follow-up. She was last seen 05/11/24, with return to follow-up in 2 months. At that encounter she had elevated ESR and CRP of unclear significance with primary diagnosis of myofascial pain syndrome and fibromyalgia as the cause of her symptoms.  Since her visit she was dx w/ GCA and underwent temporal artery biopsy, which returned negative. She states the headache is on the entire left side of her head/face, and has been present for ~2 months. She has went to multiple specialist, including neurology, without any results. She states it was continuous, and did not improve at all with high dose prednisone. She states the only thing that improved the pain was the temporal artery biopsy two weeks ago (she states she has not had a severe headache since the procedure). She was on Prednisone 60mg  daily x 1 month, just ran out yesterday. She denies any associated vision changes, admits to some jaw claudication symptoms.  She continues to admit to persistent pain all over, no changes. No other new symptoms.  Activities of Daily Living:  Patient reports morning stiffness for 5 minutes.   Patient Reports nocturnal pain.  Difficulty dressing/grooming: Denies Difficulty climbing stairs: Denies Difficulty getting out of chair: Denies Difficulty using hands for taps, buttons, cutlery, and/or writing: Denies  Review of Systems  Constitutional:  Negative for fatigue.  HENT:  Positive for mouth dryness. Negative for mouth sores.   Eyes:  Negative for dryness.  Respiratory:  Negative for shortness of breath.   Cardiovascular:  Negative for chest pain and palpitations.  Gastrointestinal:  Negative for blood in stool, constipation and  diarrhea.  Endocrine: Negative for increased urination.  Genitourinary:  Negative for involuntary urination.  Musculoskeletal:  Positive for joint pain, gait problem, joint pain, joint swelling, myalgias, morning stiffness, muscle tenderness and myalgias. Negative for muscle weakness.  Skin:  Negative for color change, rash, hair loss and sensitivity to sunlight.  Allergic/Immunologic: Negative for susceptible to infections.  Neurological:  Negative for dizziness and headaches.  Hematological:  Negative for swollen glands.  Psychiatric/Behavioral:  Negative for depressed mood and sleep disturbance. The patient is not nervous/anxious.     PMFS History:  Patient Active Problem List   Diagnosis Date Noted   Normocytic anemia 07/01/2024   Vascular complication 11/07/2022   MDD (major depressive disorder), recurrent episode, moderate (HCC) 07/30/2022   GAD (generalized anxiety disorder) 07/30/2022   PTSD (post-traumatic stress disorder) 07/30/2022   Insomnia 05/24/2022   Arthritis of first metatarsophalangeal (MTP) joint of right foot 04/25/2022   Intractable nausea and vomiting 11/16/2020   Ileus (HCC) 11/14/2020   Fecal impaction (HCC) 11/14/2020   Left-sided weakness 09/29/2020   Asthma 09/29/2020   Pre-diabetes 09/29/2020   Pain in left finger(s) 07/12/2020   Seizure-like activity (HCC) 03/25/2020   History of head injury 03/25/2020   Obstructive sleep apnea syndrome 09/04/2019   Acute medial meniscus tear of right knee 07/10/2019   Class 3 severe obesity due to excess calories with serious comorbidity and body mass index (BMI) of 50.0 to 59.9 in adult (HCC) 06/01/2019   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  Atrial fibrillation (HCC) 04/17/2019   History of pulmonary embolus (PE) 04/17/2019    Past Medical History:  Diagnosis Date   (HFpEF) heart failure with preserved ejection fraction (HCC) 01/2019   a.) Dx'd in [place] , [place]; b.) TTE 08/14/2022: EF 60-65%, mod LVH, mild  LAE, triv MR   Anxiety    Asthma 05/69   Atrial fibrillation and flutter (HCC) 02/16/2019   a.) CHA2DS2VASc = 6 (sex, HFpEF, HTN, CVA x2, T2DM);  b.) s/p DCCV (200 J x 1) 08/22/2022; c.) s/p RF ablation (CTI) 11/02/2022; d.) rate/rhythm maintained on oral flecainide  + metoprolol  succinate; chronically anticoagulated with rivaroxaban    CHF (congestive heart failure) (HCC) 01/2019   Class 3 severe obesity due to excess calories with body mass index (BMI) of 50.0 to 59.9 in adult [facility])    DDD (degenerative disc disease), lumbar    Dysrhythmia    A. Fib   GERD (gastroesophageal reflux disease)    Heart murmur 07/2017   Hiatal hernia    History of head injury    HTN (hypertension)    Hypercholesterolemia    Ileus (HCC)    Intractable nausea and vomiting 11/16/2020   Long term current use of anticoagulant    a.) rivaroxaban    Macular degeneration of left eye    MDD (major depressive disorder)    OA (osteoarthritis)    OSA on CPAP    Pneumonia    Presence of IVC filter    PTSD (post-traumatic stress disorder)    Recurrent pulmonary emboli (HCC)    a.) s/p IVC filter placement 2003; b.) previously followed by hematology when living in [place]   Seizure disorder (HCC)    a.) last was in 2020 per pt report   Stroke (HCC) 02/1995   1989 and 1995 (left sided weakness)   T2DM (type 2 diabetes mellitus) (HCC)    Vitamin D deficiency     Family History  Problem Relation Age of Onset   Breast cancer Mother    Heart attack Father    Diabetes Father    Congestive Heart Failure Father    Clotting disorder Father    Heart disease Father    Heart failure Father    Hypertension Father    Heart failure Brother    Breast cancer Maternal Grandmother    Arrhythmia Paternal Grandmother    Past Surgical History:  Procedure Laterality Date   A-FLUTTER ABLATION N/A 11/02/2022   Procedure: A-FLUTTER ABLATION;  Surgeon: Mealor, Eulas BRAVO, MD;  Location: [facility];  Service:  Cardiovascular;  Laterality: N/A;   ARTERY BIOPSY Left 07/21/2024   Procedure: BIOPSY TEMPORAL ARTERY;  Surgeon: Pearline Norman RAMAN, MD;  Location: [facility] OR;  Service: Vascular;  Laterality: Left;   ARTHRODESIS METATARSALPHALANGEAL JOINT (MTPJ) Left 02/25/2023   Procedure: ARTHRODESIS METATARSALPHALANGEAL JOINT (MTPJ);  Surgeon: Tobie Franky SQUIBB, DPM;  Location: [facility] ORS;  Service: Podiatry;  Laterality: Left;   CARDIAC CATHETERIZATION  2017   in [facility] in [place]   CARDIOVERSION N/A 08/22/2022   Procedure: CARDIOVERSION;  Surgeon: Sheena Pugh, DO;  Location: [facility];  Service: Cardiovascular;  Laterality: N/A;   CESAREAN SECTION N/A 1989   CESAREAN SECTION N/A 1993   CHOLECYSTECTOMY  2003   COLONOSCOPY WITH PROPOFOL  N/A 06/05/2019   Procedure: COLONOSCOPY WITH PROPOFOL ;  Surgeon: Rollin Dover, MD;  Location: [facility];  Service: Endoscopy;  Laterality: N/A;   CYST REMOVAL WITH BONE GRAFT Left 10/18/2020   Procedure: BONE GRAFTING OF ENCHONDROMA MIDDLE  [facility] OF LEFT MIDDLE FINGER;  Surgeon: Murrell Kuba, MD;  Location: [facility] OR;  Service: Orthopedics;  Laterality: Left;  AXILLARY BLOCK   DIAGNOSTIC LAPAROSCOPY  2015; 2017   lap hernia repair x2   ENDOVENOUS ABLATION SAPHENOUS VEIN W/ LASER Left 08/31/2021   endovenous laser ablation left greater saphenous vein and stab phlebectomy 10-20 incisions left leg by Penne Colorado MD   HALLUX FUSION Right 05/28/2022   Procedure: HALLUX FUSION METATARSAL PHALANGEAL JOIT;  Surgeon: Tobie Franky SQUIBB, DPM;  Location: [facility];  Service: Podiatry;  Laterality: Right;  BLOCK   HERNIA REPAIR  2014, 2018   umbilical hernia repair   IVC FILTER INSERTION  2003   POLYPECTOMY  06/05/2019   Procedure: POLYPECTOMY;  Surgeon: Rollin Dover, MD;  Location: [facility];  Service: Endoscopy;;   SHOULDER ARTHROSCOPY W/ ROTATOR CUFF REPAIR Right 08/28/2017   TOTAL ABDOMINAL HYSTERECTOMY  2003   UPPER GI ENDOSCOPY     WRIST  ARTHROSCOPY WITH DEBRIDEMENT Left 10/18/2020   Procedure: LEFT WRIST ARTHROSCOPY WITH DEBRIDEMENT;  Surgeon: Murrell Kuba, MD;  Location: [facility] OR;  Service: Orthopedics;  Laterality: Left;  AXILLARY BLOCK   WRIST ARTHROSCOPY WITH DEBRIDEMENT Right 01/25/2022   Procedure: ARTHROSCOPY RIGHT WRIST WITH  DEBRIDEMENT/ SHRINKAGE;  Surgeon: Murrell Franky, MD;  Location: [facility] OR;  Service: Orthopedics;  Laterality: Right;   Social History   Social History Narrative   ** Merged History Encounter **       Immunization History  Administered Date(s) Administered   INFLUENZA, HIGH DOSE SEASONAL PF 05/22/2019   Influenza Split 05/22/2019, 07/18/2021   Influenza, Seasonal, Injecte, Preservative Fre 06/19/2023   Influenza-Unspecified 05/28/2019   PFIZER(Purple Top)SARS-COV-2 Vaccination 12/18/2019, 01/18/2020     Objective: Vital Signs: BP 136/85   Pulse 76   Temp (!) 97.4 F (36.3 C)   Resp 14   Ht 5' 5 (1.651 m)   Wt (!) 306 lb 9.6 oz (139.1 kg)   BMI 51.02 kg/m    Physical Exam Vitals and nursing note reviewed.  HENT:     Head: Normocephalic and atraumatic.     Nose: Nose normal.  Eyes:     Conjunctiva/sclera: Conjunctivae normal.     Pupils: Pupils are equal, round, and reactive to light.  Cardiovascular:     Rate and Rhythm: Normal rate and regular rhythm.  Pulmonary:     Effort: Pulmonary effort is normal. No respiratory distress.  Skin:    General: Skin is warm and dry.  Neurological:     Mental Status: She is alert. Mental status is at baseline.  Psychiatric:        Mood and Affect: Mood normal.        Behavior: Behavior normal.      Musculoskeletal Exam:   CDAI Exam: CDAI Score: -- Patient Global: --; Provider Global: -- Swollen: 2 ; Tender: 17  Joint Exam 07/27/2024      Right  Left  Glenohumeral   Tender   Tender  Wrist   Tender   Tender  MTP 1  Swollen Tender   Tender  MTP 2   Tender   Tender  MTP 3   Tender   Tender  MTP 4   Tender   Tender  MTP 5   Tender    Tender  IP (toe)  Swollen Tender   Tender  PIP 2 (toe)   Tender        Investigation: No additional findings.  Imaging: MR ANKLE LEFT WO  CONTRAST Result Date: 07/26/2024 MR ANKLE WITHOUT IV CONTRAST LEFT COMPARISON: None. CLINICAL HISTORY: Osteoarthritis, ankle pain. PULSE SEQUENCES: Ax T1, Ax T2 FS, Sag T1, Sag T2 FS, Cor STIR, Ax T1 FS FINDINGS: Bones: There is mild degenerative arthrosis with suggestion of mild pes planus deformity. There is no accelerated arthrosis, fracture or contusion pattern. A few small chronic ossicles are adjacent to the tip of the medial malleolus likely related to chronic injury. No significant joint effusion is present. Ligaments: The anterior and posterior tibiofibular and talofibular ligaments are intact. Deltoid ligament and spring ligaments are intact. The sinus tarsi is unremarkable. Musculotendinous structures: The tibialis anterior, extensor digitorum and extensor hallucis longus tendons are unremarkable. There is minimal tenosynovitis of the distal posterior tibial tendon. No significant tendinosis is present. The flexor hallucis longus and flexor digitorum tendons are unremarkable. Peroneal tendons demonstrate no significant abnormality. No significant tenosynovitis or tendinosis. The Achilles tendon and plantar fascia are unremarkable. IMPRESSION: Mild degenerative changes without accelerated arthrosis. No acute abnormality. Suggestion of mild pes planus deformity. A few small chronic ossicles are adjacent to the tip of the medial malleolus likely related to chronic injury. Mild tenosynovitis of the distal posterior tibial tendon. No significant tendinosis is present. Electronically signed by: Norleen Satchel MD 07/26/2024 08:34 AM EST RP Workstation: [id number]   DG Ankle Complete Left Result Date: 07/10/2024 Please see detailed radiograph report in office note.  DG Chest 2 View Result Date: 07/05/2024 CLINICAL DATA:  Shortness of breath and left-sided  chest pain. EXAM: CHEST - 2 VIEW COMPARISON:  03/09/2024 FINDINGS: Lungs are somewhat hypoinflated as a lordotic technique is demonstrated on the frontal image. No evidence of focal lobar consolidation or effusion. Minimal prominence of the central pulmonary vasculature likely due to the degree of hypoinflation. Borderline stable cardiomegaly. Remainder of the exam is unchanged. IMPRESSION: 1. Hypoinflation without acute cardiopulmonary disease. 2. Borderline stable cardiomegaly. Electronically Signed   By: Toribio Agreste M.D.   On: 07/05/2024 11:12    Recent Labs: Lab Results  Component Value Date   WBC 5.9 07/14/2024   HGB 11.6 (L) 07/21/2024   PLT 238 07/14/2024   NA 139 07/21/2024   K 3.9 07/21/2024   CL 105 07/21/2024   CO2 31 07/14/2024   GLUCOSE 92 07/21/2024   BUN 10 07/21/2024   CREATININE 0.90 07/21/2024   BILITOT 0.3 07/14/2024   ALKPHOS 155 (H) 07/14/2024   AST 13 (L) 07/14/2024   ALT 11 07/14/2024   PROT 7.4 07/14/2024   ALBUMIN 3.9 07/14/2024   CALCIUM 10.3 07/14/2024   GFRAA >60 05/11/2020   Lab Results  Component Value Date   ANA Negative 03/16/2024   RF <10.0 03/16/2024    Speciality Comments: No specialty comments available.  Procedures:  No procedures performed Allergies: Acetaminophen -codeine, Bee venom, Meloxicam, Tramadol, Codeine, Coconut (cocos nucifera), and Tomato   Assessment / Plan:     Visit Diagnoses:   Elevated ESR Elevated CRP GCA? Patient with hx of elevated inflammatory markers w/ 3 month h of intractable left-sided headache. This is a complicated case given her inflammatory marker were noted to be elevated prior to the onset of her headache (which lead to initial visit with me in August). Her headache also was present for ~3 months, and she reported no change at all in her headache with very high dose prednisone (60mg  x 1 month). Temporal artery biopsy returned negative, however patient is aware that this does not rule out the disease given  risk of skip  lesions.   As discussed with patient, low suspicion for GCA at this time, however cannot be definitive given inflammatory markers are consistently elevated. However, it is also very odd that she had no change in symptoms with high dose prednisone. Advised patient that she should not just stop prednisone without proper taper, patient willing to restart prednisone at this time with taper.   Will start at 50mg  x 14 days, with taper by 10mg  q 2 weeks. Patient instructed to not decrease below 10mg  prior to follow-up. Patient also instructed to reach out immediately or seek urgent care if she develops new or worsening symptoms concerning for GCA (headache, visual changes, jaw claudication). Patient verbalizes understanding.  Will repeat Sedimentation rate, C-reactive protein today. Given unclear etiology of persistently elevated inflammatory markers, will obtain Protein Electrophoresis, (serum), IFE Interpretation, Kappa/lambda light chains. Patient states that high dose of prednisone also did not impact her other chronic pain symptoms, further emphasizing suspicion of non-inflammatory etiology of her chronic myofascial pain.  Recurrent ear infections Will check  ANCA Screen Reflex Titer(QUEST), IgG, IgA, IgM today to rule out alternative etiology of recurrent infections.  Long-term corticosteroid use High risk medication use Patient on high dose prednisone as discussed above. Patient given PPI for ulcer prophylaxis. CBC and CMP stable from 07/14/24.  Orders: Orders Placed This Encounter  Procedures   ANCA Screen Reflex Titer(QUEST)   IgG, IgA, IgM   Sedimentation rate   C-reactive protein   Protein Electrophoresis, (serum)   IFE Interpretation   Kappa/lambda light chains   Meds ordered this encounter  Medications   predniSONE (DELTASONE) 20 MG tablet    Sig: Take 2.5 tablets (50 mg total) by mouth daily with breakfast for 14 days, THEN 2 tablets (40 mg total) daily with breakfast  for 14 days, THEN 1.5 tablets (30 mg total) daily with breakfast for 14 days, THEN 1 tablet (20 mg total) daily with breakfast for 14 days, THEN 0.5 tablets (10 mg total) daily with breakfast for 14 days.    Dispense:  105 tablet    Refill:  0   pantoprazole  (PROTONIX ) 40 MG tablet    Sig: Take 1 tablet (40 mg total) by mouth daily.    Dispense:  90 tablet    Refill:  0    I personally spent a total of 40 minutes in the care of the patient today including preparing to see the patient, getting/reviewing separately obtained history, performing a medically appropriate exam/evaluation, counseling and educating, placing orders, referring and communicating with other health care professionals, documenting clinical information in the EHR, and communicating results.  Follow-Up Instructions: Return in about 7 weeks (around 09/14/2024).   Asberry Claw, DO

## 2024-07-28 DIAGNOSIS — M797 Fibromyalgia: Secondary | ICD-10-CM | POA: Insufficient documentation

## 2024-07-30 ENCOUNTER — Other Ambulatory Visit: Payer: Self-pay

## 2024-07-30 ENCOUNTER — Ambulatory Visit
Admission: EM | Admit: 2024-07-30 | Discharge: 2024-07-30 | Disposition: A | Discharge status: Home / Self Care | Attending: Family Medicine | Admitting: Family Medicine

## 2024-07-30 ENCOUNTER — Encounter: Payer: Self-pay | Admitting: Emergency Medicine

## 2024-07-30 DIAGNOSIS — R072 Precordial pain: Secondary | ICD-10-CM | POA: Diagnosis not present

## 2024-07-30 NOTE — Discharge Instructions (Addendum)
 She will go to the emergency room for further evaluation by private vehicle.  Another person is driving her

## 2024-07-30 NOTE — ED Provider Notes (Signed)
 [facility]    CSN: [id number] Arrival date & time: 07/30/24  1829      History   Chief Complaint Chief Complaint  Patient presents with   Chest Pain    HPI Meghan [name] is a 58 y.o. female.    Chest Pain   Here for chest pain that began about 330 PM today, about 4 1/2 hours ago. It is a squeezing pain that is bothering her in her left chest, and wraps around her lower rib cage to her back.  No cough/dizziness/n/v/diaphoresis.  Has h/o A fib, had ablation earlier this year. Has h/o HFpEF.  Takes Xarelto   Past Medical History:  Diagnosis Date   (HFpEF) heart failure with preserved ejection fraction (HCC) 01/2019   a.) Dx'd in [place]; b.) TTE 08/14/2022: EF 60-65%, mod LVH, mild LAE, triv MR   Anxiety    Asthma 05/69   Atrial fibrillation and flutter (HCC) 02/16/2019   a.) CHA2DS2VASc = 6 (sex, HFpEF, HTN, CVA x2, T2DM);  b.) s/p DCCV (200 J x 1) 08/22/2022; c.) s/p RF ablation (CTI) 11/02/2022; d.) rate/rhythm maintained on oral flecainide  + metoprolol  succinate; chronically anticoagulated with rivaroxaban    CHF (congestive heart failure) (HCC) 01/2019   Class 3 severe obesity due to excess calories with body mass index (BMI) of 50.0 to 59.9 in adult [facility])    DDD (degenerative disc disease), lumbar    Dysrhythmia    A. Fib   GERD (gastroesophageal reflux disease)    Heart murmur 07/2017   Hiatal hernia    History of head injury    HTN (hypertension)    Hypercholesterolemia    Ileus (HCC)    Intractable nausea and vomiting 11/16/2020   Long term current use of anticoagulant    a.) rivaroxaban    Macular degeneration of left eye    MDD (major depressive disorder)    OA (osteoarthritis)    OSA on CPAP    Pneumonia    Presence of IVC filter    PTSD (post-traumatic stress disorder)    Recurrent pulmonary emboli (HCC)    a.) s/p IVC filter placement 2003; b.) previously followed by hematology when living in [place] , [place]   Seizure disorder  (HCC)    a.) last was in 2020 per pt report   Stroke [facility]) 02/1995   1989 and 1995 (left sided weakness)   T2DM (type 2 diabetes mellitus) (HCC)    Vitamin D deficiency     Patient Active Problem List   Diagnosis Date Noted   Fibromyalgia 07/28/2024   Normocytic anemia 07/01/2024   Vascular complication 11/07/2022   BMI 50.0-59.9, adult (HCC) 09/20/2022   Cerebrovascular disease 09/20/2022   MDD (major depressive disorder), recurrent episode, moderate (HCC) 07/30/2022   GAD (generalized anxiety disorder) 07/30/2022   PTSD (post-traumatic stress disorder) 07/30/2022   Insomnia 05/24/2022   Arthritis of first metatarsophalangeal (MTP) joint of right foot 04/25/2022   Intractable nausea and vomiting 11/16/2020   Ileus (HCC) 11/14/2020   Fecal impaction (HCC) 11/14/2020   Left-sided weakness 09/29/2020   Asthma 09/29/2020   Pre-diabetes 09/29/2020   Pain in left finger(s) 07/12/2020   Seizure-like activity (HCC) 03/25/2020   History of head injury 03/25/2020   Acute medial meniscus tear of right knee 07/10/2019   Class 3 severe obesity due to excess calories with serious comorbidity and body mass index (BMI) of 50.0 to 59.9 in adult [facility]) 06/01/2019   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   Atrial fibrillation (HCC) 04/17/2019  History of pulmonary embolus (PE) 04/17/2019   Type 2 diabetes mellitus, without long-term current use of insulin  (HCC) 04/17/2019   OSA (obstructive sleep apnea) 02/14/2018   Moderate persistent asthma without complication 02/14/2018   PE (pulmonary thromboembolism) (HCC) 02/14/2018   Seizures (HCC) 02/14/2018    Past Surgical History:  Procedure Laterality Date   A-FLUTTER ABLATION N/A 11/02/2022   Procedure: A-FLUTTER ABLATION;  Surgeon: Nancey Eulas BRAVO, MD;  Location: [facility];  Service: Cardiovascular;  Laterality: N/A;   ARTERY BIOPSY Left 07/21/2024   Procedure: BIOPSY TEMPORAL ARTERY;  Surgeon: Pearline Norman RAMAN, MD;  Location: [facility]  OR;  Service: Vascular;  Laterality: Left;   ARTHRODESIS METATARSALPHALANGEAL JOINT (MTPJ) Left 02/25/2023   Procedure: ARTHRODESIS METATARSALPHALANGEAL JOINT (MTPJ);  Surgeon: Tobie Franky SQUIBB, DPM;  Location: [facility] ORS;  Service: Podiatry;  Laterality: Left;   CARDIAC CATHETERIZATION  2017   in [facility] in [place]   CARDIOVERSION N/A 08/22/2022   Procedure: CARDIOVERSION;  Surgeon: Sheena Pugh, DO;  Location: [facility];  Service: Cardiovascular;  Laterality: N/A;   CESAREAN SECTION N/A 1989   CESAREAN SECTION N/A 1993   CHOLECYSTECTOMY  2003   COLONOSCOPY WITH PROPOFOL  N/A 06/05/2019   Procedure: COLONOSCOPY WITH PROPOFOL ;  Surgeon: Rollin Dover, MD;  Location: [facility];  Service: Endoscopy;  Laterality: N/A;   CYST REMOVAL WITH BONE GRAFT Left 10/18/2020   Procedure: BONE GRAFTING OF ENCHONDROMA MIDDLE PHANLANX OF LEFT MIDDLE FINGER;  Surgeon: Murrell Kuba, MD;  Location: [facility] OR;  Service: Orthopedics;  Laterality: Left;  AXILLARY BLOCK   DIAGNOSTIC LAPAROSCOPY  2015; 2017   lap hernia repair x2   ENDOVENOUS ABLATION SAPHENOUS VEIN W/ LASER Left 08/31/2021   endovenous laser ablation left greater saphenous vein and stab phlebectomy 10-20 incisions left leg by Penne Colorado MD   HALLUX FUSION Right 05/28/2022   Procedure: HALLUX FUSION METATARSAL PHALANGEAL JOIT;  Surgeon: Tobie Franky SQUIBB, DPM;  Location: [facility];  Service: Podiatry;  Laterality: Right;  BLOCK   HERNIA REPAIR  2014, 2018   umbilical hernia repair   IVC FILTER INSERTION  2003   POLYPECTOMY  06/05/2019   Procedure: POLYPECTOMY;  Surgeon: Rollin Dover, MD;  Location: [facility];  Service: Endoscopy;;   SHOULDER ARTHROSCOPY W/ ROTATOR CUFF REPAIR Right 08/28/2017   TOTAL ABDOMINAL HYSTERECTOMY  2003   UPPER GI ENDOSCOPY     WRIST ARTHROSCOPY WITH DEBRIDEMENT Left 10/18/2020   Procedure: LEFT WRIST ARTHROSCOPY WITH DEBRIDEMENT;  Surgeon: Murrell Kuba, MD;  Location: [facility] OR;  Service:  Orthopedics;  Laterality: Left;  AXILLARY BLOCK   WRIST ARTHROSCOPY WITH DEBRIDEMENT Right 01/25/2022   Procedure: ARTHROSCOPY RIGHT WRIST WITH  DEBRIDEMENT/ SHRINKAGE;  Surgeon: Murrell Franky, MD;  Location: [facility] OR;  Service: Orthopedics;  Laterality: Right;    OB History     Gravida  2   Para  2   Term  2   Preterm      AB      Living         SAB      IAB      Ectopic      Multiple      Live Births               Home Medications    Prior to Admission medications   Medication Sig Start Date End Date Taking? Authorizing Provider  albuterol  (VENTOLIN  HFA) 108 (90 Base) MCG/ACT inhaler Inhale 2 puffs into the lungs every 6 (  six) hours as needed for wheezing or shortness of breath.   Yes [provider]  amitriptyline (ELAVIL) 50 MG tablet Take 50 mg by mouth at bedtime.   Yes [provider]  bumetanide  (BUMEX ) 1 MG tablet Take 1 tablet (1 mg total) by mouth daily for 10 days. 12/25/22 07/30/24 Yes White, Adrienne R, NP  Cholecalciferol (VITAMIN D-3) 125 MCG (5000 UT) TABS Take 5,000 Units by mouth daily.   Yes [provider]  MOUNJARO 12.5 MG/0.5ML Pen Inject 12.5 mg into the skin once a week. Tuesdays 06/01/24  Yes [provider]  Omega-3 Fatty Acids (FISH OIL) 1200 MG CAPS Take 1,200 mg by mouth daily with breakfast.   Yes [provider]  oxyCODONE -acetaminophen  (PERCOCET) 7.5-325 MG tablet Take 1 tablet by mouth every 6 (six) hours as needed for severe pain (pain score 7-10) (Headaches). 06/30/24  Yes [provider]  pantoprazole  (PROTONIX ) 40 MG tablet Take 1 tablet (40 mg total) by mouth daily. 07/27/24  Yes Szer, Asberry, DO  predniSONE (DELTASONE) 20 MG tablet Take 2.5 tablets (50 mg total) by mouth daily with breakfast for 14 days, THEN 2 tablets (40 mg total) daily with breakfast for 14 days, THEN 1.5 tablets (30 mg total) daily with breakfast for 14 days, THEN 1 tablet (20 mg total) daily with breakfast for  14 days, THEN 0.5 tablets (10 mg total) daily with breakfast for 14 days. 07/27/24 10/05/24 Yes Szer, Asberry, DO  rivaroxaban  (XARELTO ) 20 MG TABS tablet Take 1 tablet (20 mg total) by mouth at bedtime. 07/22/24  Yes Schuh, McKenzi P, PA-C  sertraline  (ZOLOFT ) 50 MG tablet Take 50 mg by mouth in the morning. 01/01/21  Yes [provider]  amoxicillin-clavulanate (AUGMENTIN) 875-125 MG tablet Take 1 tablet by mouth 2 (two) times daily. Patient not taking: Reported on 07/27/2024    [provider]  ofloxacin (FLOXIN) 0.3 % OTIC solution Place 5 drops into both ears 2 (two) times daily. Patient not taking: Reported on 07/27/2024 05/28/24   [provider]  pantoprazole  (PROTONIX ) 20 MG tablet Take 20 mg by mouth daily. Patient not taking: Reported on 07/30/2024 07/14/24   [provider]  predniSONE (DELTASONE) 20 MG tablet Take 60 mg by mouth daily. Patient not taking: Reported on 07/27/2024 07/14/24   [provider]    Family History Family History  Problem Relation Age of Onset   Breast cancer Mother    Heart attack Father    Diabetes Father    Congestive Heart Failure Father    Clotting disorder Father    Heart disease Father    Heart failure Father    Hypertension Father    Heart failure Brother    Breast cancer Maternal Grandmother    Arrhythmia Paternal Grandmother     Social History Social History   Tobacco Use   Smoking status: Never    Passive exposure: Never   Smokeless tobacco: Never  Vaping Use   Vaping status: Never Used  Substance Use Topics   Alcohol use: Never   Drug use: Never     Allergies   Acetaminophen -codeine, Bee venom, Meloxicam, Tramadol, Codeine, Coconut (cocos nucifera), and Tomato   Review of Systems Review of Systems  Cardiovascular:  Positive for chest pain.     Physical Exam Triage Vital Signs ED Triage Vitals [07/30/24 1842]  Encounter Vitals Group     BP 128/76     Girls Systolic BP  Percentile      Girls  Diastolic BP Percentile      Boys Systolic BP Percentile      Boys Diastolic BP Percentile      Pulse Rate 74     Resp 18     Temp 98.9 F (37.2 C)     Temp Source Oral     SpO2 96 %     Weight      Height      Head Circumference      Peak Flow      Pain Score 10     Pain Loc      Pain Education      Exclude from Growth Chart    No data found.  Updated Vital Signs BP 128/76 (BP Location: Left Arm)   Pulse 74   Temp 98.9 F (37.2 C) (Oral)   Resp 18   SpO2 96%   Visual Acuity Right Eye Distance:   Left Eye Distance:   Bilateral Distance:    Right Eye Near:   Left Eye Near:    Bilateral Near:     Physical Exam Vitals reviewed.  Constitutional:      General: She is not in acute distress.    Appearance: She is not ill-appearing, toxic-appearing or diaphoretic.  Cardiovascular:     Rate and Rhythm: Normal rate and regular rhythm.     Heart sounds: No murmur heard. Pulmonary:     Effort: Pulmonary effort is normal.     Breath sounds: Normal breath sounds.  Skin:    Coloration: Skin is not pale.  Neurological:     Mental Status: She is alert and oriented to person, place, and time.  Psychiatric:        Behavior: Behavior normal.      UC Treatments / Results  Labs (all labs ordered are listed, but only abnormal results are displayed) Labs Reviewed - No data to display  EKG   Radiology No results found.  Procedures Procedures (including critical care time)  Medications Ordered in UC Medications - No data to display  Initial Impression / Assessment and Plan / UC Course  I have reviewed the triage vital signs and the nursing notes.  Pertinent labs & imaging results that were available during my care of the patient were reviewed by me and considered in my medical decision making (see chart for details).   EKG shows some possible changes in the ST segments, but no definitive elevation or depression.   I have asked her to  proceed to the emergency room for further evaluation that we cannot provide here in the urgent care setting.  She is agreeable and will go with her family driving by private vehicle.   Final Clinical Impressions(s) / UC Diagnoses   Final diagnoses:  Precordial pain     Discharge Instructions      She will go to the emergency room for further evaluation by private vehicle.  Another person is driving her    ED Prescriptions   None    PDMP not reviewed this encounter.   Vonna Sharlet POUR, MD 07/30/24 [phone]

## 2024-07-30 NOTE — ED Triage Notes (Signed)
 Pt reports chest pain that runs under her L breast and around her back - started around 3:30pm and has been constant since then. Hx of atrial fibrillation, but states she has had no issues since her ablation (Feb 2025). Denies SOB and palpitations. Describes a squeezing pain in the area. Pt notes headaches that have been ongoing for at least 2 weeks and woke up with blurred vision this morning. Denies weakness and fatigue. Also reports nose bleeds the past couple days. Seen by PCP earlier today and was referred to ENT.

## 2024-07-30 NOTE — ED Notes (Signed)
 Patient is being discharged from the Urgent Care and sent to the Emergency Department via Private Vehicle (Family) . Per Provider, patient is in need of higher level of care due to Chest Pain. Patient is aware and verbalizes understanding of plan of care.  Vitals:   07/30/24 1842  BP: 128/76  Pulse: 74  Resp: 18  Temp: 98.9 F (37.2 C)  SpO2: 96%

## 2024-07-31 ENCOUNTER — Emergency Department (HOSPITAL_COMMUNITY)

## 2024-07-31 ENCOUNTER — Emergency Department (HOSPITAL_COMMUNITY)
Admission: EM | Admit: 2024-07-31 | Discharge: 2024-07-31 | Disposition: A | Discharge status: Home / Self Care | Attending: Emergency Medicine | Admitting: Emergency Medicine

## 2024-07-31 DIAGNOSIS — Z86711 Personal history of pulmonary embolism: Secondary | ICD-10-CM | POA: Insufficient documentation

## 2024-07-31 DIAGNOSIS — W500XXA Accidental hit or strike by another person, initial encounter: Secondary | ICD-10-CM | POA: Insufficient documentation

## 2024-07-31 DIAGNOSIS — I509 Heart failure, unspecified: Secondary | ICD-10-CM | POA: Insufficient documentation

## 2024-07-31 DIAGNOSIS — R079 Chest pain, unspecified: Secondary | ICD-10-CM | POA: Insufficient documentation

## 2024-07-31 DIAGNOSIS — Z794 Long term (current) use of insulin: Secondary | ICD-10-CM | POA: Diagnosis not present

## 2024-07-31 DIAGNOSIS — Z7901 Long term (current) use of anticoagulants: Secondary | ICD-10-CM | POA: Diagnosis not present

## 2024-07-31 DIAGNOSIS — E119 Type 2 diabetes mellitus without complications: Secondary | ICD-10-CM | POA: Diagnosis not present

## 2024-07-31 DIAGNOSIS — Y92811 Bus as the place of occurrence of the external cause: Secondary | ICD-10-CM | POA: Insufficient documentation

## 2024-07-31 DIAGNOSIS — M25531 Pain in right wrist: Secondary | ICD-10-CM | POA: Insufficient documentation

## 2024-07-31 LAB — BASIC METABOLIC PANEL WITH GFR
Anion gap: 10 (ref 5–15)
BUN: 13 mg/dL (ref 6–20)
CO2: 28 mmol/L (ref 22–32)
Calcium: 10.6 mg/dL — ABNORMAL HIGH (ref 8.9–10.3)
Chloride: 102 mmol/L (ref 98–111)
Creatinine, Ser: 0.88 mg/dL (ref 0.44–1.00)
GFR, Estimated: >60 mL/min (ref >60)
Glucose, Bld: 90 mg/dL (ref 70–99)
Potassium: 3.5 mmol/L (ref 3.5–5.1)
Sodium: 139 mmol/L (ref 135–145)

## 2024-07-31 LAB — CBC
HCT: 38 % (ref 36.0–46.0)
Hemoglobin: 12.2 g/dL (ref 12.0–15.0)
MCH: 27.9 pg (ref 26.0–34.0)
MCHC: 32.1 g/dL (ref 30.0–36.0)
MCV: 86.8 fL (ref 80.0–100.0)
Platelets: 249 K/uL (ref 150–400)
RBC: 4.38 MIL/uL (ref 3.87–5.11)
RDW: 14.5 % (ref 11.5–15.5)
WBC: 5.3 K/uL (ref 4.0–10.5)
nRBC: 0 % (ref 0.0–0.2)

## 2024-07-31 LAB — TROPONIN T, HIGH SENSITIVITY: Troponin T High Sensitivity: <15 ng/L (ref 0–19)

## 2024-07-31 MED ORDER — ACETAMINOPHEN 500 MG PO TABS
1000.0000 mg | ORAL_TABLET | Freq: Once | ORAL | Status: AC
  Administered 2024-07-31: 1000 mg via ORAL
  Filled 2024-07-31: qty 2
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
## 2024-07-31 NOTE — Discharge Instructions (Addendum)
 Evaluation for your chest pain and wrist pain were reassuring.  Please follow-up with your PCP.  As we discussed if your chest pain worsens, you develop shortness of breath, calf swelling or tenderness or any other concerning symptom please return to ED for further evaluation.  For your right wrist pain you can use the brace as needed for comfort.  You can also continue to take Tylenol  for your chest pain and wrist pain.  X-ray was negative for fracture or dislocation in your wrist.

## 2024-08-01 LAB — PROTEIN ELECTROPHORESIS, SERUM
Albumin ELP: 4 g/dL (ref 3.8–4.8)
Alpha 1: 0.3 g/dL (ref 0.2–0.3)
Alpha 2: 0.9 g/dL (ref 0.5–0.9)
Beta 2: 0.5 g/dL (ref 0.2–0.5)
Beta Globulin: 0.5 g/dL (ref 0.4–0.6)
Gamma Globulin: 1.3 g/dL (ref 0.8–1.7)
Total Protein: 7.5 g/dL (ref 6.1–8.1)

## 2024-08-01 LAB — IGG, IGA, IGM
IgG (Immunoglobin G), Serum: 1510 mg/dL (ref 600–1640)
IgM, Serum: 56 mg/dL (ref 50–300)
Immunoglobulin A: 213 mg/dL (ref 47–310)

## 2024-08-01 LAB — KAPPA/LAMBDA LIGHT CHAINS
Kappa free light chain: 21.7 mg/L — ABNORMAL HIGH (ref 3.3–19.4)
Kappa:Lambda Ratio: 1.33 (ref 0.26–1.65)
Lambda Free Lght Chn: 16.3 mg/L (ref 5.7–26.3)

## 2024-08-01 LAB — ANCA SCREEN W REFLEX TITER: ANCA SCREEN: NEGATIVE

## 2024-08-01 LAB — IFE INTERPRETATION

## 2024-08-01 LAB — SEDIMENTATION RATE: Sed Rate: 65 mm/h — ABNORMAL HIGH (ref 0–30)

## 2024-08-01 LAB — C-REACTIVE PROTEIN: CRP: 9.9 mg/L — ABNORMAL HIGH (ref <8.0)

## 2024-08-07 ENCOUNTER — Ambulatory Visit: Payer: Self-pay

## 2024-08-07 ENCOUNTER — Ambulatory Visit (INDEPENDENT_AMBULATORY_CARE_PROVIDER_SITE_OTHER): Admitting: Podiatry

## 2024-08-07 DIAGNOSIS — M76822 Posterior tibial tendinitis, left leg: Secondary | ICD-10-CM | POA: Diagnosis not present

## 2024-08-07 DIAGNOSIS — Z01818 Encounter for other preprocedural examination: Secondary | ICD-10-CM

## 2024-08-07 DIAGNOSIS — M19072 Primary osteoarthritis, left ankle and foot: Secondary | ICD-10-CM

## 2024-08-07 NOTE — Progress Notes (Unsigned)
 Subjective:  Patient ID: Meghan [name], female    DOB: 18-Jul-1966,  MRN: [id number]  Chief Complaint  Patient presents with   Foot Pain    Results of MRI     58 y.o. female presents with the above complaint.  Patient presents for follow-up of left ankle capsulitis/pain.  She states is about the same nothing has helped.  She is here to go over the MRI she also has pain on the medial side of her foot as well.  She wants to discuss treatment options for this she has tried conservative care she wishes to discuss surgical options   Review of Systems: Negative except as noted in the HPI. Denies N/V/F/Ch.  Past Medical History:  Diagnosis Date   (HFpEF) heart failure with preserved ejection fraction (HCC) 01/2019   a.) Dx'd in [place]; b.) TTE 08/14/2022: EF 60-65%, mod LVH, mild LAE, triv MR   Anxiety    Asthma 05/69   Atrial fibrillation and flutter (HCC) 02/16/2019   a.) CHA2DS2VASc = 6 (sex, HFpEF, HTN, CVA x2, T2DM);  b.) s/p DCCV (200 J x 1) 08/22/2022; c.) s/p RF ablation (CTI) 11/02/2022; d.) rate/rhythm maintained on oral flecainide  + metoprolol  succinate; chronically anticoagulated with rivaroxaban    CHF (congestive heart failure) (HCC) 01/2019   Class 3 severe obesity due to excess calories with body mass index (BMI) of 50.0 to 59.9 in adult [facility])    DDD (degenerative disc disease), lumbar    Dysrhythmia    A. Fib   GERD (gastroesophageal reflux disease)    Heart murmur 07/2017   Hiatal hernia    History of head injury    HTN (hypertension)    Hypercholesterolemia    Ileus (HCC)    Intractable nausea and vomiting 11/16/2020   Long term current use of anticoagulant    a.) rivaroxaban    Macular degeneration of left eye    MDD (major depressive disorder)    OA (osteoarthritis)    OSA on CPAP    Pneumonia    Presence of IVC filter    PTSD (post-traumatic stress disorder)    Recurrent pulmonary emboli (HCC)    a.) s/p IVC filter placement 2003; b.) previously  followed by hematology when living in [place] , [place]   Seizure disorder (HCC)    a.) last was in 2020 per pt report   Stroke (HCC) 02/1995   1989 and 1995 (left sided weakness)   T2DM (type 2 diabetes mellitus) (HCC)    Vitamin D deficiency     Current Outpatient Medications:    albuterol  (VENTOLIN  HFA) 108 (90 Base) MCG/ACT inhaler, Inhale 2 puffs into the lungs every 6 (six) hours as needed for wheezing or shortness of breath., Disp: , Rfl:    amitriptyline (ELAVIL) 50 MG tablet, Take 50 mg by mouth at bedtime., Disp: , Rfl:    amoxicillin-clavulanate (AUGMENTIN) 875-125 MG tablet, Take 1 tablet by mouth 2 (two) times daily. (Patient not taking: Reported on 07/27/2024), Disp: , Rfl:    bumetanide  (BUMEX ) 1 MG tablet, Take 1 tablet (1 mg total) by mouth daily for 10 days., Disp: 10 tablet, Rfl: 0   Cholecalciferol (VITAMIN D-3) 125 MCG (5000 UT) TABS, Take 5,000 Units by mouth daily., Disp: , Rfl:    MOUNJARO 12.5 MG/0.5ML Pen, Inject 12.5 mg into the skin once a week. Tuesdays, Disp: , Rfl:    ofloxacin (FLOXIN) 0.3 % OTIC solution, Place 5 drops into both ears 2 (two) times daily. (Patient not taking: Reported  on 07/27/2024), Disp: , Rfl:    Omega-3 Fatty Acids (FISH OIL) 1200 MG CAPS, Take 1,200 mg by mouth daily with breakfast., Disp: , Rfl:    oxyCODONE -acetaminophen  (PERCOCET) 7.5-325 MG tablet, Take 1 tablet by mouth every 6 (six) hours as needed for severe pain (pain score 7-10) (Headaches)., Disp: , Rfl:    pantoprazole  (PROTONIX ) 20 MG tablet, Take 20 mg by mouth daily. (Patient not taking: Reported on 07/30/2024), Disp: , Rfl:    pantoprazole  (PROTONIX ) 40 MG tablet, Take 1 tablet (40 mg total) by mouth daily., Disp: 90 tablet, Rfl: 0   predniSONE  (DELTASONE ) 20 MG tablet, Take 60 mg by mouth daily. (Patient not taking: Reported on 07/27/2024), Disp: , Rfl:    predniSONE  (DELTASONE ) 20 MG tablet, Take 2.5 tablets (50 mg total) by mouth daily with breakfast for 14 days, THEN 2  tablets (40 mg total) daily with breakfast for 14 days, THEN 1.5 tablets (30 mg total) daily with breakfast for 14 days, THEN 1 tablet (20 mg total) daily with breakfast for 14 days, THEN 0.5 tablets (10 mg total) daily with breakfast for 14 days., Disp: 105 tablet, Rfl: 0   rivaroxaban  (XARELTO ) 20 MG TABS tablet, Take 1 tablet (20 mg total) by mouth at bedtime., Disp: , Rfl:    sertraline  (ZOLOFT ) 50 MG tablet, Take 50 mg by mouth in the morning., Disp: , Rfl:   Social History   Tobacco Use  Smoking Status Never   Passive exposure: Never  Smokeless Tobacco Never    Allergies  Allergen Reactions   Acetaminophen -Codeine Swelling, Rash and Other (See Comments)    Tylenol  with Codeine, Tylenol  #3,  (facial swelling, hives)   Bee Venom Anaphylaxis   Meloxicam Anaphylaxis and Rash   Tramadol Hives and Other (See Comments)    Patient stated I was trippin' and I do not want that ever again   Codeine Hives and Swelling   Coconut (Cocos Nucifera) Rash and Other (See Comments)    ANY coconut products    Tomato Rash   Objective:  There were no vitals filed for this visit. There is no height or weight on file to calculate BMI. Constitutional Well developed. Well nourished.  Vascular Dorsalis pedis pulses palpable bilaterally. Posterior tibial pulses palpable bilaterally. Capillary refill normal to all digits.  No cyanosis or clubbing noted. Pedal hair growth normal.  Neurologic Normal speech. Oriented to person, place, and time. Epicritic sensation to light touch grossly present bilaterally.  Dermatologic Nails well groomed and normal in appearance. No open wounds. No skin lesions.  Orthopedic: Pain on palpation to the left ankle pain with range of motion of the ankle joint deep intra-articular ankle pain noted.  No pain at the posterior tibial peroneal ATFL ligament no pain at the Achilles tendon  Pain along the course of the posterior tibial tendon pain with resisted dorsiflexion  and eversion of the foot no pain with   Radiographs: Pes planovalgus 3 views of skeletally mature adult left ankle: 3 views of skeletally mature the left ankle: No osteoarthritis noted mild midfoot changes noted.  Pes planovalgus noted.  No other bony abnormalities identified.  IMPRESSION: Mild degenerative changes without accelerated arthrosis. No acute abnormality. Suggestion of mild pes planus deformity.   A few small chronic ossicles are adjacent to the tip of the medial malleolus likely related to chronic injury.   Mild tenosynovitis of the distal posterior tibial tendon. No significant tendinosis is present. Assessment:   1. Arthritis of ankle, left  2. Posterior tibial tendinitis, left     Plan:  Patient was evaluated and treated and all questions answered.  Left ankle synovitis with left posterior tibial tendinitis pain - All questions and concerns were discussed with the patient in extensive detail MRI was reviewed with the patient in extensive detail which does show some mild osteoarthritic.  Given the amount of pain that she is still having she would benefit from ankle arthroscopy to help clean out the joint evaluate the ankle joint.  She has failed all conservative care including shoe gear modification padding protecting offloading of which at this time she would benefit from surgical intervention.  She states understanding like to proceed with surgery -She would also benefit from left posterior tibial tendon repair and evaluation as she is also hurting along the course of the posterior tibial tendon on MRI it appears to be mild tenosynovitis but could likely be more worse than expected.  At this time she would benefit from exploratory posterior tibial tendon repair and evaluation I discussed this with the patient she states that she would like to proceed with surgery -Informed surgical risk consent was reviewed and read aloud to the patient.  I reviewed the films.  I have  discussed my findings with the patient in great detail.  I have discussed all risks including but not limited to infection, stiffness, scarring, limp, disability, deformity, damage to blood vessels and nerves, numbness, poor healing, need for braces, arthritis, chronic pain, amputation, death.  All benefits and realistic expectations discussed in great detail.  I have made no promises as to the outcome.  I have provided realistic expectations.  I have offered the patient a 2nd opinion, which they have declined and assured me they preferred to proceed despite the risks  No follow-ups on file.   Left ankle arthroscopy with debridement  Left posterior tibial tendon repair

## 2024-08-14 ENCOUNTER — Encounter (INDEPENDENT_AMBULATORY_CARE_PROVIDER_SITE_OTHER): Payer: Self-pay

## 2024-08-24 ENCOUNTER — Ambulatory Visit (INDEPENDENT_AMBULATORY_CARE_PROVIDER_SITE_OTHER): Admitting: Licensed Clinical Social Worker

## 2024-08-24 DIAGNOSIS — F331 Major depressive disorder, recurrent, moderate: Secondary | ICD-10-CM

## 2024-08-24 DIAGNOSIS — F4310 Post-traumatic stress disorder, unspecified: Secondary | ICD-10-CM

## 2024-08-24 DIAGNOSIS — F411 Generalized anxiety disorder: Secondary | ICD-10-CM

## 2024-08-24 NOTE — Progress Notes (Signed)
 Virtual Visit via Video Note   I connected with Shalonda S. Despain on 08/24/24 at 1:00pm by video enabled telemedicine application and verified that I am speaking with the correct person using two identifiers.   I discussed the limitations, risks, security and privacy concerns of performing an evaluation and management service by video and the availability of in person appointments. I also discussed with the patient that there may be a patient responsible charge related to this service. The patient expressed understanding and agreed to proceed.   I discussed the assessment and treatment plan with the patient. The patient was provided an opportunity to ask questions and all were answered. The patient agreed with the plan and demonstrated an understanding of the instructions.   The patient was advised to call back or seek an in-person evaluation if the symptoms worsen or if the condition fails to improve as anticipated.   I provided 35 minutes of non-face-to-face time during this encounter.   [name], LCSW, LCAS _______________________ THERAPIST PROGRESS NOTE   Session Time:  1:00pm - 1:35pm     Location: Patient: Patient home Provider: Home Office   Participation Level: Active    Behavioral Response: Alert, casually dressed, euthymic mood/affect   Type of Therapy:  Individual Therapy   Treatment Goals addressed: Mood management; Medication management   Progress Towards Goals: Progressing   Interventions: CBT, supportive therapy    Summary: Shelene S. Elman is a 58 year old divorced African American female that presented for therapy appointment today with diagnoses of Major Depressive Disorder, recurrent, moderate, Generalized Anxiety Disorder and PTSD.      Suicidal/Homicidal: None; without plan or intent                                                                                                                Therapist Response:  Clinician met with Mirage for virtual therapy  appointment and assessed for safety, sobriety, and medication compliance.  Arvilla presented for session on time and was alert, oriented x5, with no evidence or self-report of active SI/HI or A/V H.  Zahra reported ongoing compliance with medication and denied any use of alcohol or illicit substances.  Clinician inquired about Kamaree's current emotional ratings, as well as any significant changes in thoughts, feelings, or behavior since previous check-in.  Sharleen reported scores of 0/10 for depression, 0/10 for anxiety, and 0/10 for anger/irritability.  Jahleah denied any recent panic attacks or outbursts.  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  It got accepted.  Camani reported that a struggle was having a cousin pass away from cancer yesterday. Clinician expressed sympathy for Jozlynn's loss, and revisited psychoeducation with her on the 5 stages of grief, including denial, anger, bargaining, depression, and acceptance.  Clinician discussed how each stage can affect an individual, and provided strategies on how to faciliate healthy grieving as she processes this loss. Strategies provided to [facility] included taking time to allow healthy emotional expression (I.e.  allowing oneself to cry when appropriate), engaging in healthy self-care activities for distraction, talking to people who can relate to the loss for support, and considering ways to memorialize the deceased.  Interventions were effective, as evidenced by Koren actively participating in discussion on this loss, and the impact it has had upon her mental health.  Kinzy reported that dealing with grief has historically been a challenge for her, so she was surprised by how well she has handled this loss in comparison to the past.  She reported that she believes this loss may have been different due to having time to say goodbye, since they were aware of this family member's cancer for over a year  and not feeling denial, or the need to bargain for more time to say goodbye.  Cailah reported that she is going to avoid compartmentalizing her feelings and allow time to cry when she feels sad, and be mindful of any feelings of anger, and avoid lashing out at others.  Gage stated I'm handling things a lot different now.  I'm gonna get my cry out and be fine.  Clinician will continue to monitor.          Plan: Follow up again in 1-2 weeks.    Diagnosis: Major depressive disorder, recurrent, moderate; Generalized Anxiety Disorder; and PTSD    Collaboration of Care:   No collaboration of care required for this visit.                                                   Patient/Guardian was advised Release of Information must be obtained prior to any record release in order to collaborate their care with an outside provider. Patient/Guardian was advised if they have not already done so to contact the registration department to sign all necessary forms in order for us  to release information regarding their care.    Consent: Patient/Guardian gives verbal consent for treatment and assignment of benefits for services provided during this visit. Patient/Guardian expressed understanding and agreed to proceed.   [name], LCSW, LCAS 08/24/24

## 2024-08-26 ENCOUNTER — Other Ambulatory Visit: Payer: Self-pay

## 2024-08-26 ENCOUNTER — Emergency Department (HOSPITAL_COMMUNITY)

## 2024-08-26 ENCOUNTER — Emergency Department (HOSPITAL_COMMUNITY)
Admission: EM | Admit: 2024-08-26 | Discharge: 2024-08-26 | Disposition: A | Discharge status: Home / Self Care | Attending: Emergency Medicine | Admitting: Emergency Medicine

## 2024-08-26 DIAGNOSIS — R29810 Facial weakness: Secondary | ICD-10-CM | POA: Diagnosis not present

## 2024-08-26 DIAGNOSIS — J45909 Unspecified asthma, uncomplicated: Secondary | ICD-10-CM | POA: Insufficient documentation

## 2024-08-26 DIAGNOSIS — I5030 Unspecified diastolic (congestive) heart failure: Secondary | ICD-10-CM | POA: Insufficient documentation

## 2024-08-26 DIAGNOSIS — E119 Type 2 diabetes mellitus without complications: Secondary | ICD-10-CM | POA: Insufficient documentation

## 2024-08-26 DIAGNOSIS — R0789 Other chest pain: Secondary | ICD-10-CM | POA: Insufficient documentation

## 2024-08-26 DIAGNOSIS — Z7901 Long term (current) use of anticoagulants: Secondary | ICD-10-CM | POA: Insufficient documentation

## 2024-08-26 DIAGNOSIS — Z8673 Personal history of transient ischemic attack (TIA), and cerebral infarction without residual deficits: Secondary | ICD-10-CM | POA: Insufficient documentation

## 2024-08-26 DIAGNOSIS — F430 Acute stress reaction: Secondary | ICD-10-CM

## 2024-08-26 DIAGNOSIS — R471 Dysarthria and anarthria: Secondary | ICD-10-CM | POA: Insufficient documentation

## 2024-08-26 DIAGNOSIS — R2990 Unspecified symptoms and signs involving the nervous system: Secondary | ICD-10-CM

## 2024-08-26 DIAGNOSIS — R519 Headache, unspecified: Secondary | ICD-10-CM | POA: Diagnosis not present

## 2024-08-26 DIAGNOSIS — I4891 Unspecified atrial fibrillation: Secondary | ICD-10-CM | POA: Diagnosis not present

## 2024-08-26 DIAGNOSIS — G8194 Hemiplegia, unspecified affecting left nondominant side: Secondary | ICD-10-CM | POA: Diagnosis not present

## 2024-08-26 DIAGNOSIS — Z79899 Other long term (current) drug therapy: Secondary | ICD-10-CM | POA: Diagnosis not present

## 2024-08-26 DIAGNOSIS — I110 Hypertensive heart disease with heart failure: Secondary | ICD-10-CM | POA: Diagnosis not present

## 2024-08-26 DIAGNOSIS — R0602 Shortness of breath: Secondary | ICD-10-CM | POA: Diagnosis not present

## 2024-08-26 DIAGNOSIS — R4182 Altered mental status, unspecified: Secondary | ICD-10-CM | POA: Diagnosis present

## 2024-08-26 DIAGNOSIS — M6281 Muscle weakness (generalized): Secondary | ICD-10-CM | POA: Diagnosis present

## 2024-08-26 DIAGNOSIS — R531 Weakness: Secondary | ICD-10-CM | POA: Diagnosis not present

## 2024-08-26 LAB — PROTIME-INR
INR: 1.1 (ref 0.8–1.2)
Prothrombin Time: 14.7 s (ref 11.4–15.2)

## 2024-08-26 LAB — COMPREHENSIVE METABOLIC PANEL WITH GFR
ALT: 12 U/L (ref 0–44)
AST: 16 U/L (ref 15–41)
Albumin: 3.8 g/dL (ref 3.5–5.0)
Alkaline Phosphatase: 139 U/L — ABNORMAL HIGH (ref 38–126)
Anion gap: 9 (ref 5–15)
BUN: 16 mg/dL (ref 6–20)
CO2: 24 mmol/L (ref 22–32)
Calcium: 10.2 mg/dL (ref 8.9–10.3)
Chloride: 105 mmol/L (ref 98–111)
Creatinine, Ser: 0.92 mg/dL (ref 0.44–1.00)
GFR, Estimated: >60 mL/min (ref >60)
Glucose, Bld: 103 mg/dL — ABNORMAL HIGH (ref 70–99)
Potassium: 3.4 mmol/L — ABNORMAL LOW (ref 3.5–5.1)
Sodium: 138 mmol/L (ref 135–145)
Total Bilirubin: 0.7 mg/dL (ref 0.0–1.2)
Total Protein: 7.5 g/dL (ref 6.5–8.1)

## 2024-08-26 LAB — I-STAT CHEM 8, ED
BUN: 18 mg/dL (ref 6–20)
Calcium, Ion: 1.29 mmol/L (ref 1.15–1.40)
Chloride: 104 mmol/L (ref 98–111)
Creatinine, Ser: 1.1 mg/dL — ABNORMAL HIGH (ref 0.44–1.00)
Glucose, Bld: 101 mg/dL — ABNORMAL HIGH (ref 70–99)
HCT: 39 % (ref 36.0–46.0)
Hemoglobin: 13.3 g/dL (ref 12.0–15.0)
Potassium: 3.4 mmol/L — ABNORMAL LOW (ref 3.5–5.1)
Sodium: 141 mmol/L (ref 135–145)
TCO2: 26 mmol/L (ref 22–32)

## 2024-08-26 LAB — DIFFERENTIAL
Abs Immature Granulocytes: 0.02 K/uL (ref 0.00–0.07)
Basophils Absolute: 0 K/uL (ref 0.0–0.1)
Basophils Relative: 0 %
Eosinophils Absolute: 0.1 K/uL (ref 0.0–0.5)
Eosinophils Relative: 1 %
Immature Granulocytes: 0 %
Lymphocytes Relative: 27 %
Lymphs Abs: 2.2 K/uL (ref 0.7–4.0)
Monocytes Absolute: 0.8 K/uL (ref 0.1–1.0)
Monocytes Relative: 9 %
Neutro Abs: 5.1 K/uL (ref 1.7–7.7)
Neutrophils Relative %: 63 %

## 2024-08-26 LAB — CBC
HCT: 39.5 % (ref 36.0–46.0)
Hemoglobin: 12.5 g/dL (ref 12.0–15.0)
MCH: 27.7 pg (ref 26.0–34.0)
MCHC: 31.6 g/dL (ref 30.0–36.0)
MCV: 87.6 fL (ref 80.0–100.0)
Platelets: 280 K/uL (ref 150–400)
RBC: 4.51 MIL/uL (ref 3.87–5.11)
RDW: 14.7 % (ref 11.5–15.5)
WBC: 8.2 K/uL (ref 4.0–10.5)
nRBC: 0 % (ref 0.0–0.2)

## 2024-08-26 LAB — TROPONIN I (HIGH SENSITIVITY)
Troponin I (High Sensitivity): 5 ng/L (ref <18)
Troponin I (High Sensitivity): 3 ng/L (ref <18)

## 2024-08-26 LAB — ETHANOL: Alcohol, Ethyl (B): <15 mg/dL (ref <15)

## 2024-08-26 LAB — BRAIN NATRIURETIC PEPTIDE: B Natriuretic Peptide: 15.1 pg/mL (ref 0.0–100.0)

## 2024-08-26 LAB — APTT: aPTT: 29 s (ref 24–36)

## 2024-08-26 MED ORDER — IOHEXOL 350 MG/ML SOLN
75.0000 mL | Freq: Once | INTRAVENOUS | Status: AC | PRN
  Administered 2024-08-26: 75 mL via INTRAVENOUS

## 2024-08-26 MED ORDER — ACETAMINOPHEN 500 MG PO TABS
1000.0000 mg | ORAL_TABLET | Freq: Once | ORAL | Status: AC
  Administered 2024-08-26: 1000 mg via ORAL
  Filled 2024-08-26: qty 2

## 2024-08-26 MED ORDER — SODIUM CHLORIDE 0.9% FLUSH
3.0000 mL | Freq: Once | INTRAVENOUS | Status: AC
  Administered 2024-08-26: 3 mL via INTRAVENOUS

## 2024-08-26 NOTE — ED Provider Notes (Signed)
 [facility] AT [facility] Provider Note  CSN: [id number] Arrival date & time: 08/26/24 0144  Chief Complaint(s) Code Stroke  HPI & MDM Meghan [name] is a 58 y.o. female here for as a code stroke activated by EMS.  Last known well 2045 PM.  Deficits include dysarthria and left-sided upper and lower extremity weakness with loss of sensation.  Patient was reportedly found unresponsive when family arrived.  Bystander CPR was performed.  [employer] arrived and did 3-4 rounds of CPR and patient became responsive.  When EMS arrived, patient had left-sided facial droop left-sided weakness and leftward gaze.  Patient has a history of seizure.  Also has a history of atrial fibrillation on Xarelto    The history is provided by the EMS personnel.   Patient admitted taken to CT scanner.  Exam was inconsistent with acute stroke (see neuro note for detailed exam).  CT scan and CTA were negative for ICH or obvious large vessel occlusion.  Code stroke was canceled.  I went back and spoke with the patient who was not able to communicate.  She reports that she was having chest discomfort that began around 9 PM.  Nonexertional and nonradiating.  Some shortness of breath.  No recent fevers or infections.  No cough or congestion.  Patient reports that the chest discomfort lasted for several hours.  EKG without acute ischemic changes or evidence of pericarditis.  No dysrhythmias or high-grade blocks.  No significant interval changes.  Troponins negative x 2.  BnP and imaging not concerning for heart failure.  CBC without leukocytosis or anemia.  Metabolic panel without significant electrolyte derangements or renal sufficiency. Chest x-ray without evidence of pneumonia, pneumothorax, pulmonary edema pleural effusions.  It is possible the patient could have had a seizure and had Todd's paralysis from it.  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  Possible psychogenic.  Denies any illicit drug use.  Able to ambulate without complication and tolerate p.o.     Medical Decision Making Amount and/or Complexity of Data Reviewed Labs: ordered. Decision-making details documented in ED Course. Radiology: ordered and independent interpretation performed. Decision-making details documented in ED Course. ECG/medicine tests: ordered and independent interpretation performed. Decision-making details documented in ED Course.  Risk Decision regarding hospitalization.    Final Clinical Impression(s) / ED Diagnoses Final diagnoses:  Chest discomfort  SOB (shortness of breath)  Stroke-like episode   The patient appears reasonably screened and/or stabilized for discharge and I doubt any other medical condition or other [facility] requiring further screening, evaluation, or treatment in the ED at this time. I have discussed the findings, Dx and Tx plan with the patient/family who expressed understanding and agree(s) with the plan. Discharge instructions discussed at length. The patient/family was given strict return precautions who verbalized understanding of the instructions. No further questions at time of discharge.  Disposition: Discharge  Condition: Good  ED Discharge Orders     None        Follow Up: Health, [address] [place] [address] [phone]  Call  to schedule an appointment for close follow up  Cardiology  Call  to schedule an appointment for close follow up     Past Medical History Past Medical History:  Diagnosis Date   (HFpEF) heart failure with preserved ejection fraction (HCC) 01/2019   a.) Dx'd in [place]; b.) TTE 08/14/2022: EF 60-65%, mod LVH, mild LAE, triv  MR   Anxiety    Asthma 05/69   Atrial fibrillation and flutter (HCC) 02/16/2019   a.) CHA2DS2VASc = 6 (sex, HFpEF, HTN, CVA x2, T2DM);  b.) s/p DCCV (200 J x 1) 08/22/2022; c.) s/p RF ablation (CTI) 11/02/2022; d.)  rate/rhythm maintained on oral flecainide  + metoprolol  succinate; chronically anticoagulated with rivaroxaban    CHF (congestive heart failure) (HCC) 01/2019   Class 3 severe obesity due to excess calories with body mass index (BMI) of 50.0 to 59.9 in adult [facility])    DDD (degenerative disc disease), lumbar    Dysrhythmia    A. Fib   GERD (gastroesophageal reflux disease)    Heart murmur 07/2017   Hiatal hernia    History of head injury    HTN (hypertension)    Hypercholesterolemia    Ileus (HCC)    Intractable nausea and vomiting 11/16/2020   Long term current use of anticoagulant    a.) rivaroxaban    Macular degeneration of left eye    MDD (major depressive disorder)    OA (osteoarthritis)    OSA on CPAP    Pneumonia    Presence of IVC filter    PTSD (post-traumatic stress disorder)    Recurrent pulmonary emboli (HCC)    a.) s/p IVC filter placement 2003; b.) previously followed by hematology when living in [place]   Seizure disorder (HCC)    a.) last was in 2020 per pt report   Stroke (HCC) 02/1995   1989 and 1995 (left sided weakness)   T2DM (type 2 diabetes mellitus) (HCC)    Vitamin D deficiency    Patient Active Problem List   Diagnosis Date Noted   Fibromyalgia 07/28/2024   Normocytic anemia 07/01/2024   Vascular complication 11/07/2022   BMI 50.0-59.9, adult (HCC) 09/20/2022   Cerebrovascular disease 09/20/2022   MDD (major depressive disorder), recurrent episode, moderate (HCC) 07/30/2022   GAD (generalized anxiety disorder) 07/30/2022   PTSD (post-traumatic stress disorder) 07/30/2022   Insomnia 05/24/2022   Arthritis of first metatarsophalangeal (MTP) joint of right foot 04/25/2022   Intractable nausea and vomiting 11/16/2020   Ileus (HCC) 11/14/2020   Fecal impaction (HCC) 11/14/2020   Left-sided weakness 09/29/2020   Asthma 09/29/2020   Pre-diabetes 09/29/2020   Pain in left finger(s) 07/12/2020   Seizure-like activity (HCC) 03/25/2020   History  of head injury 03/25/2020   Acute medial meniscus tear of right knee 07/10/2019   Class 3 severe obesity due to excess calories with serious comorbidity and body mass index (BMI) of 50.0 to 59.9 in adult (HCC) 06/01/2019   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   Atrial fibrillation (HCC) 04/17/2019   History of pulmonary embolus (PE) 04/17/2019   Type 2 diabetes mellitus, without long-term current use of insulin  (HCC) 04/17/2019   OSA (obstructive sleep apnea) 02/14/2018   Moderate persistent asthma without complication 02/14/2018   PE (pulmonary thromboembolism) (HCC) 02/14/2018   Seizures (HCC) 02/14/2018   Home Medication(s) Prior to Admission medications   Medication Sig Start Date End Date Taking? Authorizing Provider  albuterol  (VENTOLIN  HFA) 108 (90 Base) MCG/ACT inhaler Inhale 2 puffs into the lungs every 6 (six) hours as needed for wheezing or shortness of breath.    [provider]  amitriptyline (ELAVIL) 50 MG tablet Take 50 mg by mouth at bedtime.    [provider]  amoxicillin-clavulanate (AUGMENTIN) 875-125 MG tablet Take 1 tablet by mouth 2 (two) times daily. Patient not taking: Reported on 07/27/2024    [provider]  bumetanide  (BUMEX ) 1 MG tablet Take 1 tablet (1 mg total) by mouth daily for 10 days. 12/25/22 07/30/24  Teresa Shelba SAUNDERS, NP  Cholecalciferol (VITAMIN D-3) 125 MCG (5000 UT) TABS Take 5,000 Units by mouth daily.    [provider]  MOUNJARO 12.5 MG/0.5ML Pen Inject 12.5 mg into the skin once a week. Tuesdays 06/01/24   [provider]  ofloxacin (FLOXIN) 0.3 % OTIC solution Place 5 drops into both ears 2 (two) times daily. Patient not taking: Reported on 07/27/2024 05/28/24   [provider]  Omega-3 Fatty Acids (FISH OIL) 1200 MG CAPS Take 1,200 mg by mouth daily with breakfast.    [provider]  oxyCODONE -acetaminophen  (PERCOCET) 7.5-325 MG tablet Take 1 tablet by mouth every 6 (six) hours as  needed for severe pain (pain score 7-10) (Headaches). 06/30/24   [provider]  pantoprazole  (PROTONIX ) 20 MG tablet Take 20 mg by mouth daily. Patient not taking: Reported on 07/30/2024 07/14/24   [provider]  pantoprazole  (PROTONIX ) 40 MG tablet Take 1 tablet (40 mg total) by mouth daily. 07/27/24   Szer, Asberry, DO  predniSONE  (DELTASONE ) 20 MG tablet Take 60 mg by mouth daily. Patient not taking: Reported on 07/27/2024 07/14/24   [provider]  predniSONE  (DELTASONE ) 20 MG tablet Take 2.5 tablets (50 mg total) by mouth daily with breakfast for 14 days, THEN 2 tablets (40 mg total) daily with breakfast for 14 days, THEN 1.5 tablets (30 mg total) daily with breakfast for 14 days, THEN 1 tablet (20 mg total) daily with breakfast for 14 days, THEN 0.5 tablets (10 mg total) daily with breakfast for 14 days. 07/27/24 10/05/24  Szer, Asberry, DO  rivaroxaban  (XARELTO ) 20 MG TABS tablet Take 1 tablet (20 mg total) by mouth at bedtime. 07/22/24   Schuh, McKenzi P, PA-C  sertraline  (ZOLOFT ) 50 MG tablet Take 50 mg by mouth in the morning. 01/01/21   [provider]                                                                                                                                    Allergies Acetaminophen -codeine, Bee venom, Meloxicam, Tramadol, Codeine, Coconut (cocos nucifera), and Tomato  Review of Systems Review of Systems As noted in HPI  Physical Exam Vital Signs  I have reviewed the triage vital signs BP 117/68   Pulse 86   Resp 18   Ht 5' 5 (1.651 m)   Wt 131.6 kg   SpO2 100%   BMI 48.28 kg/m   Physical Exam Vitals reviewed.  Constitutional:      General: She is not in acute distress.    Appearance: She is well-developed. She is obese. She is not diaphoretic.  HENT:     Head: Normocephalic and atraumatic.     Nose: Nose normal.  Eyes:     General: No scleral icterus.  Right eye: No discharge.        Left eye: No  discharge.     Conjunctiva/sclera: Conjunctivae normal.     Pupils: Pupils are equal, round, and reactive to light.  Cardiovascular:     Rate and Rhythm: Normal rate and regular rhythm.     Heart sounds: No murmur heard.    No friction rub. No gallop.  Pulmonary:     Effort: Pulmonary effort is normal. No respiratory distress.     Breath sounds: Normal breath sounds. No stridor. No rales.  Abdominal:     General: There is no distension.     Palpations: Abdomen is soft.     Tenderness: There is no abdominal tenderness.  Musculoskeletal:        General: No tenderness.     Cervical back: Normal range of motion and neck supple.  Skin:    General: Skin is warm and dry.     Findings: No erythema or rash.  Neurological:     Mental Status: She is alert and oriented to person, place, and time.     Comments: Detailed neuro exam by neurology     ED Results and Treatments Labs (all labs ordered are listed, but only abnormal results are displayed) Labs Reviewed  COMPREHENSIVE METABOLIC PANEL WITH GFR - Abnormal; Notable for the following components:      Result Value   Potassium 3.4 (*)    Glucose, Bld 103 (*)    Alkaline Phosphatase 139 (*)    All other components within normal limits  I-STAT CHEM 8, ED - Abnormal; Notable for the following components:   Potassium 3.4 (*)    Creatinine, Ser 1.10 (*)    Glucose, Bld 101 (*)    All other components within normal limits  PROTIME-INR  APTT  CBC  DIFFERENTIAL  ETHANOL  BRAIN NATRIURETIC PEPTIDE  CBG MONITORING, ED  TROPONIN I (HIGH SENSITIVITY)  TROPONIN I (HIGH SENSITIVITY)                                                                                                                         EKG  EKG Interpretation Date/Time:  Wednesday August 26 2024 02:26:05 EST Ventricular Rate:  80 PR Interval:  271 QRS Duration:  93 QT Interval:  372 QTC Calculation: 430 R Axis:   71  Text Interpretation: Sinus rhythm Prolonged PR  interval Borderline T abnormalities, anterior leads Confirmed by Trine Likes [phone]) on 08/26/2024 4:26:36 AM       Radiology DG Chest Port 1 View Result Date: 08/26/2024 EXAM: 1 VIEW(S) XRAY OF THE CHEST 08/26/2024 03:37:00 AM COMPARISON: 07/31/2024 CLINICAL HISTORY: Chest discomfort FINDINGS: LUNGS AND PLEURA: No focal pulmonary opacity. No pleural effusion. No pneumothorax. HEART AND MEDIASTINUM: No acute abnormality of the cardiac and mediastinal silhouettes. BONES AND SOFT TISSUES: No acute osseous abnormality. IMPRESSION: 1. No acute cardiopulmonary process. Electronically signed by: Pinkie Pebbles MD 08/26/2024 03:40 AM EST RP Workstation: [id number]   CT ANGIO  HEAD NECK W WO CM (CODE STROKE) Result Date: 08/26/2024 EXAM: CTA HEAD AND NECK WITH AND WITHOUT 08/26/2024 02:20:45 AM TECHNIQUE: CTA of the head and neck was performed with and without the administration of intravenous contrast. Multiplanar 2D and/or 3D reformatted images are provided for review. Automated exposure control, iterative reconstruction, and/or weight based adjustment of the mA/kV was utilized to reduce the radiation dose to as low as reasonably achievable. Stenosis of the internal carotid arteries measured using NASCET criteria. COMPARISON: CTA head/neck 09/11/21 CLINICAL HISTORY: Neuro deficit, acute, stroke suspected FINDINGS: AORTIC ARCH AND ARCH VESSELS: No dissection or arterial injury. No significant stenosis of the brachiocephalic or subclavian arteries. CERVICAL CAROTID ARTERIES: No dissection, arterial injury, or hemodynamically significant stenosis by NASCET criteria. CERVICAL VERTEBRAL ARTERIES: No dissection, arterial injury, or significant stenosis. LUNGS AND MEDIASTINUM: Unremarkable. SOFT TISSUES: No acute abnormality. BONES: No acute abnormality. ANTERIOR CIRCULATION: No significant stenosis of the internal carotid arteries. No significant stenosis of the anterior cerebral arteries. No significant  stenosis of the middle cerebral arteries. No aneurysm. POSTERIOR CIRCULATION: No significant stenosis of the posterior cerebral arteries. No significant stenosis of the basilar artery. No significant stenosis of the vertebral arteries. No aneurysm. OTHER: No dural venous sinus thrombosis on this non-dedicated study. IMPRESSION: 1. No large vessel occlusion or hemodynamically significant stenosis. Electronically signed by: Gilmore Molt MD 08/26/2024 02:32 AM EST RP Workstation: [id number]   CT HEAD CODE STROKE WO CONTRAST Result Date: 08/26/2024 EXAM: CT HEAD WITHOUT 08/26/2024 02:07:38 AM TECHNIQUE: CT of the head was performed without the administration of intravenous contrast. Automated exposure control, iterative reconstruction, and/or weight based adjustment of the mA/kV was utilized to reduce the radiation dose to as low as reasonably achievable. COMPARISON: CT head 05/28/2024 CLINICAL HISTORY: Neuro deficit, acute, stroke suspected. FINDINGS: BRAIN AND VENTRICLES: No acute intracranial hemorrhage. No mass effect or midline shift. No extra-axial fluid collection. No evidence of acute infarct. No hydrocephalus. ORBITS: No acute abnormality. SINUSES AND MASTOIDS: No acute abnormality. SOFT TISSUES AND SKULL: No acute skull fracture. No acute soft tissue abnormality. Other: Findings conveyed to Dr. Lindzen via pager at 2:17 PM. IMPRESSION: 1. No acute intracranial abnormality.  ASPECTS 10. Electronically signed by: Gilmore Molt MD 08/26/2024 02:17 AM EST RP Workstation: [id number]    Medications Ordered in ED Medications  sodium chloride  flush (NS) 0.9 % injection 3 mL (has no administration in time range)  iohexol  (OMNIPAQUE ) 350 MG/ML injection 75 mL (75 mLs Intravenous Contrast Given 08/26/24 0221)   Procedures Procedures  (including critical care time)   This chart was dictated using voice recognition software.  Despite best efforts to proofread,  errors can occur which can change the  documentation meaning.   Trine Raynell Moder, MD 08/26/24 [phone]

## 2024-08-26 NOTE — ED Triage Notes (Signed)
 Pt BIB [facility] as a code stroke. Pt called family member at approx 2045 stating that she didn't feel well. Again called family at stating that she couldn't breathe. Upon family arrival pt was unresponsive and family started CPR while she was in the bed. [facility] arrived and continued 3-4 rounds of CPR on the ground pt became responsive. When EMS arrived pt with L facial droop, L sided weakness and L sided gaze, mild aphasia and dysarthria.

## 2024-08-26 NOTE — ED Notes (Signed)
 Spoke with daughter on phone and gave summary of patient's visit. Family reports they will be here to pick her up in a little bit.

## 2024-08-26 NOTE — Code Documentation (Signed)
 Stroke Response Nurse Documentation Code Documentation  AYVEN GLASCO is a 58 y.o. female arriving to [facility]  via [facility] on 12/10 with past medical hx of HTN, CHF, morbid obesity, HLD, PE, OSA with CPAP, seizures. On Xarelto  (rivaroxaban ) daily. Code stroke was activated by EMS.   Patient from home where she was LKW at 2045 and now complaining of left sided weakness/AMS.    Stroke team at the bedside on patient arrival. Labs drawn and patient cleared for CT by Dr. Trine. Patient to CT with team. NIHSS 10, see documentation for details and code stroke times. Patient with decreased LOC, bilateral leg weakness, left decreased sensation, and dysarthria  on exam. The following imaging was completed:  CT Head and CTA. Patient is not a candidate for IV Thrombolytic due to outside of window. Patient is not a candidate for IR due to No LVO.   Code stroke cancelled per Dr. Merrianne   Bedside handoff with ED RN W[place]ell.    Griselda Alm ORN  Rapid Response RN

## 2024-08-26 NOTE — Consult Note (Addendum)
 NEUROLOGY CONSULT NOTE   Date of service: August 26, 2024 Patient Name: Meghan [name] MRN:  [id number] DOB:  1966/05/30 Chief Complaint: Left sided weakness Requesting Provider: Trine Raynell Moder, *  History of Present Illness  Meghan [name] is a 57 y.o. female with a PMHx of (HFpEF) heart failure with preserved ejection fraction, Anxiety, Asthma, Atrial fibrillation and flutter (on oral anticoagulation), CHF, Class 3 severe obesity, lumbar DDD, GERD, Heart murmur, Hiatal hernia, History of head injury, HTN, Hypercholesterolemia, Ileus, Intractable nausea and vomiting, Macular degeneration of left eye, MDD, OA, OSA on CPAP, Pneumonia, Presence of IVC filter, PTSD, Recurrent pulmonary emboli, Seizure disorder, Stroke (02/1995), T2DM, and Vitamin D deficiency who presents as a Code Stroke to the ED from home via EMS after she was found unresponsive by family. LKN was 2045 at which time the patient called a family member stating that she didn't feel well. Shortly afterwards, she again called family stating that she couldn't breathe. Upon family arrival the patient was unresponsive and family started CPR while she was in the bed. [employer] arrived and continued 3-4 rounds of CPR on the ground after which became responsive. When EMS arrived the patient had an apparent left facial droop, left sided weakness and LEFT sided gaze, mild aphasia and dysarthria; no jerking, twitching or other seizure-like activity was noted. EMS then noted variable and inconsistent findings suggesting possible functional etiology for her left sided weakness, with patient transiently exhibiting normal left sided movements when distracted.   LKW: 2045 Modified rankin score: 0 IV Thrombolysis: No: Outside of the TNK time window EVT: No: CTA negative for LVO   NIHSS components Score: Comment  1a Level of Conscious 0[]  1[x]  2[]  3[]     Requires stimulation at times to respond. Suspect functionality  1b LOC Questions 0[x]  1[]  2[]       Gives the correct month and her age. States its my birth month. Gave her name  1c LOC Commands 0[x]  1[]  2[]        2 Best Gaze 0[x]  1[]  2[]      Conjugate gaze. Tracks normally to the right but stops abruptly at midline when asking her to track to the left. When distracted she directed a saccade to the left with Neurology. Also gave a hostile glance to the left that was sustained, when the nurse tested blink to threat on the left.   3 Visual 0[x]  1[]  2[]  3[]     Blinks to threat on the left when distracted, but does not blink when attention drawn to specific testing of visual fields. Blinks to threat readily on the right.   4 Facial Palsy 0[x]  1[]  2[]  3[]     Symmetric at rest. Widens right side of mouth and clenches lips actively on the left when asked to grimace which is inconsistent with a lesional facial droop. Tongue deviates sharply to the RIGHT when asked to protrude, suggestive of functionality.   5a Motor Arm - left 0[x]  1[]  2[]  3[]  4[]  UN[]   Holds antigravity for 1-2 seconds then abruptly falls to bed on several trials. On other trials with hand positioned above face, arm drops to back of head requiring deltoid contraction to do so; when examiner blocks arm from falling backwards, arm remains elevated above face for 10 seconds without drift.   5b Motor Arm - Right 0[x]  1[]  2[]  3[]  4[]  UN[]    6a Motor Leg - Left 0[]  1[]  2[]  3[x]  4[]  UN[]   Drops to bed immediately after passive elevation and release. Wiggles toes  weakly to request. Does not withdraw to repeated noxious stimulation of sole of foot.   6b Motor Leg - Right 0[x]  1[]  2[]  3[x]  4[]  UN[]    7 Limb Ataxia 0[x]  1[]  2[]  UN[]     Apparent inability to perform maneuvers with LUE and LLE.   8 Sensory 0[]  1[]  2[]  UN[]     Apparently insensate to LUE and LLE as well as face, but blinks normally to left eyelid stimulation  9 Best Language 0[x]  1[]  2[]  3[]      10 Dysarthria 0[]  1[x]  2[]  UN[]     Slow speech with dysarthria that has a non-physiological  quality.   11 Extinct. and Inattention 0[x]  1[]  2[]       TOTAL:   8      ROS  Unable to obtain a comprehensive ROS due to poor cooperation. Endorses a 20/10 headache, which she indicates by opening her right palm showing five fingers 4 times and then nods head when asked if her pain is 20/10. Opens and closes her right hand repeatedly when asked if headache is throbbing. Moves right hand to forehead to indicate the location of the headache.   Past History   Past Medical History:  Diagnosis Date   (HFpEF) heart failure with preserved ejection fraction (HCC) 01/2019   a.) Dx'd in [place]; b.) TTE 08/14/2022: EF 60-65%, mod LVH, mild LAE, triv MR   Anxiety    Asthma 05/69   Atrial fibrillation and flutter (HCC) 02/16/2019   a.) CHA2DS2VASc = 6 (sex, HFpEF, HTN, CVA x2, T2DM);  b.) s/p DCCV (200 J x 1) 08/22/2022; c.) s/p RF ablation (CTI) 11/02/2022; d.) rate/rhythm maintained on oral flecainide  + metoprolol  succinate; chronically anticoagulated with rivaroxaban    CHF (congestive heart failure) (HCC) 01/2019   Class 3 severe obesity due to excess calories with body mass index (BMI) of 50.0 to 59.9 in adult [facility])    DDD (degenerative disc disease), lumbar    Dysrhythmia    A. Fib   GERD (gastroesophageal reflux disease)    Heart murmur 07/2017   Hiatal hernia    History of head injury    HTN (hypertension)    Hypercholesterolemia    Ileus (HCC)    Intractable nausea and vomiting 11/16/2020   Long term current use of anticoagulant    a.) rivaroxaban    Macular degeneration of left eye    MDD (major depressive disorder)    OA (osteoarthritis)    OSA on CPAP    Pneumonia    Presence of IVC filter    PTSD (post-traumatic stress disorder)    Recurrent pulmonary emboli (HCC)    a.) s/p IVC filter placement 2003; b.) previously followed by hematology when living in [place]   Seizure disorder [facility])    a.) last was in 2020 per pt report   Stroke (HCC) 02/1995   1989 and  1995 (left sided weakness)   T2DM (type 2 diabetes mellitus) (HCC)    Vitamin D deficiency     Past Surgical History:  Procedure Laterality Date   A-FLUTTER ABLATION N/A 11/02/2022   Procedure: A-FLUTTER ABLATION;  Surgeon: Mealor, Eulas BRAVO, MD;  Location: [facility];  Service: Cardiovascular;  Laterality: N/A;   ARTERY BIOPSY Left 07/21/2024   Procedure: BIOPSY TEMPORAL ARTERY;  Surgeon: Pearline Norman RAMAN, MD;  Location: [facility] OR;  Service: Vascular;  Laterality: Left;   ARTHRODESIS METATARSALPHALANGEAL JOINT (MTPJ) Left 02/25/2023   Procedure: ARTHRODESIS METATARSALPHALANGEAL JOINT (MTPJ);  Surgeon: Tobie Franky SQUIBB, DPM;  Location: [facility] ORS;  Service: Podiatry;  Laterality: Left;   CARDIAC CATHETERIZATION  2017   in [facility] in [place]   CARDIOVERSION N/A 08/22/2022   Procedure: CARDIOVERSION;  Surgeon: Sheena Pugh, DO;  Location: [facility];  Service: Cardiovascular;  Laterality: N/A;   CESAREAN SECTION N/A 1989   CESAREAN SECTION N/A 1993   CHOLECYSTECTOMY  2003   COLONOSCOPY WITH PROPOFOL  N/A 06/05/2019   Procedure: COLONOSCOPY WITH PROPOFOL ;  Surgeon: Rollin Dover, MD;  Location: [facility];  Service: Endoscopy;  Laterality: N/A;   CYST REMOVAL WITH BONE GRAFT Left 10/18/2020   Procedure: BONE GRAFTING OF ENCHONDROMA MIDDLE PHANLANX OF LEFT MIDDLE FINGER;  Surgeon: Murrell Kuba, MD;  Location: [facility] OR;  Service: Orthopedics;  Laterality: Left;  AXILLARY BLOCK   DIAGNOSTIC LAPAROSCOPY  2015; 2017   lap hernia repair x2   ENDOVENOUS ABLATION SAPHENOUS VEIN W/ LASER Left 08/31/2021   endovenous laser ablation left greater saphenous vein and stab phlebectomy 10-20 incisions left leg by Penne Colorado MD   HALLUX FUSION Right 05/28/2022   Procedure: HALLUX FUSION METATARSAL PHALANGEAL JOIT;  Surgeon: Tobie Franky SQUIBB, DPM;  Location: [facility];  Service: Podiatry;  Laterality: Right;  BLOCK   HERNIA REPAIR  2014, 2018   umbilical hernia repair   IVC  FILTER INSERTION  2003   POLYPECTOMY  06/05/2019   Procedure: POLYPECTOMY;  Surgeon: Rollin Dover, MD;  Location: [facility];  Service: Endoscopy;;   SHOULDER ARTHROSCOPY W/ ROTATOR CUFF REPAIR Right 08/28/2017   TOTAL ABDOMINAL HYSTERECTOMY  2003   UPPER GI ENDOSCOPY     WRIST ARTHROSCOPY WITH DEBRIDEMENT Left 10/18/2020   Procedure: LEFT WRIST ARTHROSCOPY WITH DEBRIDEMENT;  Surgeon: Murrell Kuba, MD;  Location: [facility] OR;  Service: Orthopedics;  Laterality: Left;  AXILLARY BLOCK   WRIST ARTHROSCOPY WITH DEBRIDEMENT Right 01/25/2022   Procedure: ARTHROSCOPY RIGHT WRIST WITH  DEBRIDEMENT/ SHRINKAGE;  Surgeon: Murrell Franky, MD;  Location: [facility] OR;  Service: Orthopedics;  Laterality: Right;    Family History: Family History  Problem Relation Age of Onset   Breast cancer Mother    Heart attack Father    Diabetes Father    Congestive Heart Failure Father    Clotting disorder Father    Heart disease Father    Heart failure Father    Hypertension Father    Heart failure Brother    Breast cancer Maternal Grandmother    Arrhythmia Paternal Grandmother     Social History  reports that she has never smoked. She has never been exposed to tobacco smoke. She has never used smokeless tobacco. She reports that she does not drink alcohol and does not use drugs.  Allergies  Allergen Reactions   Acetaminophen -Codeine Swelling, Rash and Other (See Comments)    Tylenol  with Codeine, Tylenol  #3,  (facial swelling, hives)   Bee Venom Anaphylaxis   Meloxicam Anaphylaxis and Rash   Tramadol Hives and Other (See Comments)    Patient stated I was trippin' and I do not want that ever again   Codeine Hives and Swelling   Coconut (Cocos Nucifera) Rash and Other (See Comments)    ANY coconut products    Tomato Rash    Medications   Current Facility-Administered Medications:    sodium chloride  flush (NS) 0.9 % injection 3 mL, 3 mL, Intravenous, Once, Cardama, Raynell Moder, MD  Current Outpatient  Medications:    albuterol  (VENTOLIN  HFA) 108 (90 Base) MCG/ACT inhaler, Inhale 2 puffs into the lungs every 6 (  six) hours as needed for wheezing or shortness of breath., Disp: , Rfl:    amitriptyline (ELAVIL) 50 MG tablet, Take 50 mg by mouth at bedtime., Disp: , Rfl:    amoxicillin-clavulanate (AUGMENTIN) 875-125 MG tablet, Take 1 tablet by mouth 2 (two) times daily. (Patient not taking: Reported on 07/27/2024), Disp: , Rfl:    bumetanide  (BUMEX ) 1 MG tablet, Take 1 tablet (1 mg total) by mouth daily for 10 days., Disp: 10 tablet, Rfl: 0   Cholecalciferol (VITAMIN D-3) 125 MCG (5000 UT) TABS, Take 5,000 Units by mouth daily., Disp: , Rfl:    MOUNJARO 12.5 MG/0.5ML Pen, Inject 12.5 mg into the skin once a week. Tuesdays, Disp: , Rfl:    ofloxacin (FLOXIN) 0.3 % OTIC solution, Place 5 drops into both ears 2 (two) times daily. (Patient not taking: Reported on 07/27/2024), Disp: , Rfl:    Omega-3 Fatty Acids (FISH OIL) 1200 MG CAPS, Take 1,200 mg by mouth daily with breakfast., Disp: , Rfl:    oxyCODONE -acetaminophen  (PERCOCET) 7.5-325 MG tablet, Take 1 tablet by mouth every 6 (six) hours as needed for severe pain (pain score 7-10) (Headaches)., Disp: , Rfl:    pantoprazole  (PROTONIX ) 20 MG tablet, Take 20 mg by mouth daily. (Patient not taking: Reported on 07/30/2024), Disp: , Rfl:    pantoprazole  (PROTONIX ) 40 MG tablet, Take 1 tablet (40 mg total) by mouth daily., Disp: 90 tablet, Rfl: 0   predniSONE  (DELTASONE ) 20 MG tablet, Take 60 mg by mouth daily. (Patient not taking: Reported on 07/27/2024), Disp: , Rfl:    predniSONE  (DELTASONE ) 20 MG tablet, Take 2.5 tablets (50 mg total) by mouth daily with breakfast for 14 days, THEN 2 tablets (40 mg total) daily with breakfast for 14 days, THEN 1.5 tablets (30 mg total) daily with breakfast for 14 days, THEN 1 tablet (20 mg total) daily with breakfast for 14 days, THEN 0.5 tablets (10 mg total) daily with breakfast for 14 days., Disp: 105 tablet, Rfl: 0    rivaroxaban  (XARELTO ) 20 MG TABS tablet, Take 1 tablet (20 mg total) by mouth at bedtime., Disp: , Rfl:    sertraline  (ZOLOFT ) 50 MG tablet, Take 50 mg by mouth in the morning., Disp: , Rfl:   Vitals   Vitals:   09-17-2024 0100  Weight: 131.6 kg  Height: 5' 5 (1.651 m)    Body mass index is 48.28 kg/m.   Physical Exam   Constitutional: Supermorbid obesity Psych: Furrows brow in discomfort frequently; otherwise without facial expressions.  Eyes: No scleral injection.  HENT: No OP obstruction.  Head: Normocephalic.  Respiratory: Effort normal, non-labored breathing.  Skin: WDI.  Ext: No edema  Neurologic Examination   See NIHSS  Labs/Imaging/Neurodiagnostic studies   CBC:  Recent Labs  Lab September 17, 2024 0152  WBC 8.2  NEUTROABS 5.1  HGB 12.5  13.3  HCT 39.5  39.0  MCV 87.6  PLT 280   Basic Metabolic Panel:  Lab Results  Component Value Date   NA 141 09/17/2024   K 3.4 (L) 09-17-24   CO2 28 07/31/2024   GLUCOSE 101 (H) 09-17-24   BUN 18 September 17, 2024   CREATININE 1.10 (H) 09/17/2024   CALCIUM 10.6 (H) 07/31/2024   GFRNONAA >60 07/31/2024   GFRAA >60 05/11/2020   Lipid Panel: No results found for: LDLCALC HgbA1c:  Lab Results  Component Value Date   HGBA1C 4.8 07/17/2024   Urine Drug Screen:     Component Value Date/Time   LABOPIA NONE DETECTED 09/29/2020  1451   COCAINSCRNUR NONE DETECTED 09/29/2020 1451   LABBENZ NONE DETECTED 09/29/2020 1451   AMPHETMU NONE DETECTED 09/29/2020 1451   THCU NONE DETECTED 09/29/2020 1451   LABBARB NONE DETECTED 09/29/2020 1451    Alcohol Level     Component Value Date/Time   ETH <10 12/30/2023 1032   INR  Lab Results  Component Value Date   INR 1.1 08/26/2024   APTT  Lab Results  Component Value Date   APTT 29 08/26/2024   Prior TTE (08/14/22): 1. Left ventricular ejection fraction, by estimation, is 60 to 65%. The  left ventricle has normal function. The left ventricle has no regional  wall motion  abnormalities. There is moderate left ventricular hypertrophy.  Left ventricular diastolic  parameters are indeterminate.   2. Right ventricular systolic function is normal. The right ventricular  size is normal.   3. Left atrial size was mildly dilated.   4. The mitral valve is normal in structure. Trivial mitral valve  regurgitation. No evidence of mitral stenosis.   5. The aortic valve is tricuspid. Aortic valve regurgitation is not  visualized. No aortic stenosis is present.   6. The inferior vena cava is normal in size with greater than 50%  respiratory variability, suggesting right atrial pressure of 3 mmHg.   Recent prior MRI brain (06/06/24): Normal    ASSESSMENT  58 y.o. female with a PMHx of (HFpEF) heart failure with preserved ejection fraction, Anxiety, Asthma, Atrial fibrillation and flutter (on oral anticoagulation), CHF, Class 3 severe obesity, lumbar DDD, GERD, Heart murmur, Hiatal hernia, History of head injury, HTN, Hypercholesterolemia, Ileus, Intractable nausea and vomiting, Macular degeneration of left eye, MDD, OA, OSA on CPAP, Pneumonia, Presence of IVC filter, PTSD, Recurrent pulmonary emboli, Seizure disorder, Stroke (02/1995), T2DM, and Vitamin D deficiency who presents as a Code Stroke to the ED from home via EMS after she was found unresponsive by family. LKN was 2045 at which time the patient called a family member stating that she didn't feel well. Shortly afterwards, she again called family stating that she couldn't breathe. Upon family arrival the patient was unresponsive and family started CPR while she was in the bed. [employer] arrived and continued 3-4 rounds of CPR on the ground after which became responsive. When EMS arrived the patient had an apparent left facial droop, left sided weakness and LEFT sided gaze, mild aphasia and dysarthria; no jerking, twitching or other seizure-like activity was noted. EMS then noted variable and inconsistent findings suggesting  possible functional etiology for her left sided weakness, with patient transiently exhibiting normal left sided movements when distracted.  - Exam reveals multiple functional findings and variable left sided motor responses to various exam modalities that appear most consistent with a functional/nonorganic etiology. NIHSS 8.  - CT head: No acute intracranial abnormality. ASPECTS 10.  - CTA of head and neck: No large vessel occlusion or hemodynamically significant stenosis.  - EKG: Sinus rhythm Prolonged PR interval Borderline T abnormalities, anterior lead - Labs: EtOH negative. Glucose 103. Coags normal. CBC normal. K 3.4. Na normal. Ca normal. BUN 16, Cr 1.1. LFTs normal.  - Impression:  - Functional neurological deficits in a patient presenting with variable left sided weakness. No hard neurological findings on exam to militate in favor of stroke. CTA and CT are normal.  - Severe bifrontal throbbing headache, suggestive of possible migraine.  - SOB  RECOMMENDATIONS  - Observe in the ED for improvement - Most likely will not be able to  obtain MRI due to body habitus.  - Headache management with migraine cocktail or muscle relaxants per ED Team.  - Evaluation of SOB per ED Team.  ______________________________________________________________________    Bonney SHARK, Rodolph Hagemann, MD [facility]

## 2024-08-27 ENCOUNTER — Other Ambulatory Visit: Payer: Self-pay

## 2024-08-27 DIAGNOSIS — R609 Edema, unspecified: Secondary | ICD-10-CM

## 2024-09-01 ENCOUNTER — Ambulatory Visit (HOSPITAL_COMMUNITY): Admitting: Licensed Clinical Social Worker
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
## 2024-09-07 ENCOUNTER — Other Ambulatory Visit

## 2024-09-07 ENCOUNTER — Ambulatory Visit (INDEPENDENT_AMBULATORY_CARE_PROVIDER_SITE_OTHER): Admitting: Licensed Clinical Social Worker

## 2024-09-07 DIAGNOSIS — F331 Major depressive disorder, recurrent, moderate: Secondary | ICD-10-CM

## 2024-09-07 DIAGNOSIS — F411 Generalized anxiety disorder: Secondary | ICD-10-CM | POA: Diagnosis not present

## 2024-09-07 DIAGNOSIS — F4310 Post-traumatic stress disorder, unspecified: Secondary | ICD-10-CM | POA: Diagnosis not present

## 2024-09-07 NOTE — Progress Notes (Signed)
 Virtual Visit via Video Note   I connected with Sharan S. Rust on 09/07/24 at 2:00pm by video enabled telemedicine application and verified that I am speaking with the correct person using two identifiers.   I discussed the limitations, risks, security and privacy concerns of performing an evaluation and management service by video and the availability of in person appointments. I also discussed with the patient that there may be a patient responsible charge related to this service. The patient expressed understanding and agreed to proceed.   I discussed the assessment and treatment plan with the patient. The patient was provided an opportunity to ask questions and all were answered. The patient agreed with the plan and demonstrated an understanding of the instructions.   The patient was advised to call back or seek an in-person evaluation if the symptoms worsen or if the condition fails to improve as anticipated.   I provided 37 minutes of non-face-to-face time during this encounter.   [name], LCSW, LCAS _______________________ THERAPIST PROGRESS NOTE   Session Time:  2:00pm - 2:37pm    Location: Patient: Patient home Provider: [facility]   Participation Level: Active    Behavioral Response: Alert, casually dressed, euthymic mood/affect   Type of Therapy:  Individual Therapy   Treatment Goals addressed: Mood management; Medication management; Improving boundaries with support network    Progress Towards Goals: Progressing   Interventions: CBT, conflict resolution skills    Summary: Kenidy S. Luo is a 58 year old divorced African American female that presented for therapy appointment today with diagnoses of Major Depressive Disorder, recurrent, moderate, Generalized Anxiety Disorder and PTSD.      Suicidal/Homicidal: None; without plan or intent                                                                                                                Therapist  Response:  Clinician met with Margery for virtual therapy session and assessed for safety, sobriety, and medication compliance.  Kerrington presented for appointment on time and was alert, oriented x5, with no evidence or self-report of active SI/HI or A/V H.  Haislee reported ongoing compliance with medication and denied any abuse of alcohol or illicit substances.  Clinician inquired about Yuktha's emotional ratings today, as well as any significant changes in thoughts, feelings, or behavior since last check-in.  Audrea reported scores of 0/10 for depression, 0/10 for anxiety, and 0/10 for anger/irritability.  Brittannie reported that a recent success was having family come from out of town to visit with her for her birthday, although she didn't see them as long as she hoped for.  Oreatha reported that she was hospitalized since our last session due to a seizure event where her daughter found her unresponsive and CPR had to be administered before EMS arrived for transport.  Celestine reported that an additional struggle was having a panic attack and outburst last Friday, stating We had a rehearsal for my Christmas play and people were trying to change things around.  I almost  went off on this woman.    Clinician revisited topic of communication styles (i.e. passive, aggressive, assertive) with Cheyrl to help identify what approach the antagonist assumed which was triggering for her, as well as any areas of improvement that could be made to her Lashawn's style to reduce chances of emotional outbursts.  Clinician also reviewed conflict resolution skills that Makinzee could utilize to resolve differences with her network, including examples such as reflective listening, I statements, time outs, and seeking compromise when possible.  Clinician also provided time for Twinkle to do roleplay practice to prepare for similar distressing issues that might arise in the future.  Intervention was effective, as evidenced by [name] actively engaging in  conversation on subject and reporting that there was one person in particular that kept triggering her throughout the night, and because this was an older woman and she tries to be respectful of elders, she repressed her feelings on the matter to the point where she almost became aggressive.  Zadaya reported that her neurologist reported that stressors like this could trigger future seizures as well, so she will try to be more assertive and if they cannot reach a compromise, she will take timeouts, and get the pastor involved for additional support.  Rosann reported that she anticipates they will have to work together at the [place] event, so she will continue working on conflict resolution skills to prepare.  Clinician will continue to monitor.          Plan: Follow up again in 1-2 weeks.    Diagnosis: Major depressive disorder, recurrent, moderate; Generalized Anxiety Disorder; and PTSD    Collaboration of Care:   No collaboration of care required for this visit.                                                   Patient/Guardian was advised Release of Information must be obtained prior to any record release in order to collaborate their care with an outside provider. Patient/Guardian was advised if they have not already done so to contact the registration department to sign all necessary forms in order for us  to release information regarding their care.    Consent: Patient/Guardian gives verbal consent for treatment and assignment of benefits for services provided during this visit. Patient/Guardian expressed understanding and agreed to proceed.   [name], LCSW, LCAS 09/07/24

## 2024-09-08 ENCOUNTER — Ambulatory Visit
Admission: RE | Admit: 2024-09-08 | Discharge: 2024-09-08 | Disposition: A | Discharge status: Home / Self Care | Source: Ambulatory Visit

## 2024-09-08 DIAGNOSIS — R609 Edema, unspecified: Secondary | ICD-10-CM

## 2024-09-11 ENCOUNTER — Other Ambulatory Visit: Payer: Self-pay

## 2024-09-11 ENCOUNTER — Encounter (HOSPITAL_COMMUNITY): Payer: Self-pay

## 2024-09-11 ENCOUNTER — Emergency Department (HOSPITAL_COMMUNITY)
Admission: EM | Admit: 2024-09-11 | Discharge: 2024-09-11 | Disposition: A | Discharge status: Home / Self Care | Source: Ambulatory Visit | Attending: Emergency Medicine | Admitting: Emergency Medicine

## 2024-09-11 ENCOUNTER — Emergency Department (HOSPITAL_COMMUNITY)

## 2024-09-11 DIAGNOSIS — Z7901 Long term (current) use of anticoagulants: Secondary | ICD-10-CM | POA: Insufficient documentation

## 2024-09-11 DIAGNOSIS — I4891 Unspecified atrial fibrillation: Secondary | ICD-10-CM | POA: Insufficient documentation

## 2024-09-11 DIAGNOSIS — R634 Abnormal weight loss: Secondary | ICD-10-CM | POA: Insufficient documentation

## 2024-09-11 DIAGNOSIS — R6881 Early satiety: Secondary | ICD-10-CM | POA: Insufficient documentation

## 2024-09-11 DIAGNOSIS — I5030 Unspecified diastolic (congestive) heart failure: Secondary | ICD-10-CM | POA: Diagnosis not present

## 2024-09-11 LAB — URINALYSIS, ROUTINE W REFLEX MICROSCOPIC
Bilirubin Urine: NEGATIVE
Glucose, UA: NEGATIVE mg/dL
Hgb urine dipstick: NEGATIVE
Ketones, ur: NEGATIVE mg/dL
Nitrite: NEGATIVE
Protein, ur: NEGATIVE mg/dL
Specific Gravity, Urine: 1.006 (ref 1.005–1.030)
pH: 5 (ref 5.0–8.0)

## 2024-09-11 LAB — COMPREHENSIVE METABOLIC PANEL WITH GFR
ALT: 21 U/L (ref 0–44)
AST: 26 U/L (ref 15–41)
Albumin: 4.5 g/dL (ref 3.5–5.0)
Alkaline Phosphatase: 183 U/L — ABNORMAL HIGH (ref 38–126)
Anion gap: 10 (ref 5–15)
BUN: 13 mg/dL (ref 6–20)
CO2: 27 mmol/L (ref 22–32)
Calcium: 11.1 mg/dL — ABNORMAL HIGH (ref 8.9–10.3)
Chloride: 103 mmol/L (ref 98–111)
Creatinine, Ser: 0.85 mg/dL (ref 0.44–1.00)
GFR, Estimated: >60 mL/min
Glucose, Bld: 96 mg/dL (ref 70–99)
Potassium: 3.8 mmol/L (ref 3.5–5.1)
Sodium: 140 mmol/L (ref 135–145)
Total Bilirubin: 0.5 mg/dL (ref 0.0–1.2)
Total Protein: 8.4 g/dL — ABNORMAL HIGH (ref 6.5–8.1)

## 2024-09-11 LAB — CBC
HCT: 40.6 % (ref 36.0–46.0)
Hemoglobin: 13 g/dL (ref 12.0–15.0)
MCH: 27.4 pg (ref 26.0–34.0)
MCHC: 32 g/dL (ref 30.0–36.0)
MCV: 85.5 fL (ref 80.0–100.0)
Platelets: 291 K/uL (ref 150–400)
RBC: 4.75 MIL/uL (ref 3.87–5.11)
RDW: 14.4 % (ref 11.5–15.5)
WBC: 5.9 K/uL (ref 4.0–10.5)
nRBC: 0 % (ref 0.0–0.2)

## 2024-09-11 LAB — LIPASE, BLOOD: Lipase: 25 U/L (ref 11–51)

## 2024-09-11 MED ORDER — IOHEXOL 300 MG/ML  SOLN
100.0000 mL | Freq: Once | INTRAMUSCULAR | Status: AC | PRN
  Administered 2024-09-11: 100 mL via INTRAVENOUS

## 2024-09-11 MED ORDER — SUCRALFATE 1 G PO TABS: 1.0000 g | ORAL_TABLET | Freq: Two times a day (BID) | ORAL | 0 refills | Status: AC

## 2024-09-11 MED ORDER — PANTOPRAZOLE SODIUM 40 MG IV SOLR
40.0000 mg | Freq: Once | INTRAVENOUS | Status: AC
  Administered 2024-09-11: 40 mg via INTRAVENOUS
  Filled 2024-09-11: qty 10

## 2024-09-11 MED ORDER — ONDANSETRON HCL 4 MG/2ML IJ SOLN: 4.0000 mg | Freq: Once | INTRAMUSCULAR | Status: DC

## 2024-09-11 MED ORDER — METOCLOPRAMIDE HCL 5 MG/ML IJ SOLN
10.0000 mg | Freq: Once | INTRAMUSCULAR | Status: AC
  Administered 2024-09-11: 10 mg via INTRAVENOUS
  Filled 2024-09-11: qty 2

## 2024-09-11 MED ORDER — LACTATED RINGERS IV BOLUS
1000.0000 mL | Freq: Once | INTRAVENOUS | Status: AC
  Administered 2024-09-11: 1000 mL via INTRAVENOUS

## 2024-09-11 NOTE — Discharge Instructions (Addendum)
 You can call either of the GI providers listed.  You can tell them that you are in the emergency room and referred to them.  You can also be asked to put on their cancellation list.  In the meantime make sure you are doing protein shakes or small frequent meals.  Also continue to keep in touch with your doctor at [facility] so they can also help you coordinate being able to see the specialist.

## 2024-09-11 NOTE — ED Triage Notes (Signed)
 Pt reports weight loss of 45 pounds in 2 months, this was unintentional. Pt has seen PCP for this issue. Decreased appetite. Pt states she has to force herself to eat.

## 2024-09-11 NOTE — ED Provider Notes (Signed)
 " [facility] AT [facility] Provider Note   CSN: [id number] Arrival date & time: 09/11/24  1208     Patient presents with: Weight Loss   Meghan [name] is a 58 y.o. female.   Patient is a 58 year old female with a history of heart failure with preserved EF, recurrent PE with an IVC filter, seizure disorder, stroke, hyperlipidemia, atrial fibrillation on Xarelto , diabetes who is presenting with complaints of poor appetite, significant weight loss and now today with an episode of emesis.  Patient reports for the last month or 2 she has been having worsening anorexia.  She reports now she is not even interested in food but when she does eat she only takes a few bites and is already full.  She says that her doctor stopped Mounjaro 1 month ago but she had been on it for 8 months and had been doing okay until recently.  Even with stopping the Mounjaro it did not change her appetite.  She occasionally will have stomach pain and occasionally will feel nauseated but that is not a common complaint.  She reports in the last 3 weeks she has lost 45 pounds because she just does not feel like eating.  She is able to drink liquids but also does not feel like doing that either and reports when she arrived in the emergency room was the first time she had urinated all day.  She also reports that her bowel movements are very small.  She has not had fever, no recent abdominal procedures.  She did have evaluation of her esophagus earlier in the year which showed dysmotility and decreased peristalsis.  She has talked with her doctor about this but nothing has been set up for further evaluation yet.  The history is provided by the patient and medical records.       Prior to Admission medications  Medication Sig Start Date End Date Taking? Authorizing Provider  sucralfate  (CARAFATE ) 1 g tablet Take 1 tablet (1 g total) by mouth 2 (two) times daily. 09/11/24 10/09/24 Yes Doretha Folks,  MD  albuterol  (VENTOLIN  HFA) 108 (90 Base) MCG/ACT inhaler Inhale 2 puffs into the lungs every 6 (six) hours as needed for wheezing or shortness of breath.    [provider]  amitriptyline (ELAVIL) 50 MG tablet Take 50 mg by mouth at bedtime.    [provider]  amoxicillin-clavulanate (AUGMENTIN) 875-125 MG tablet Take 1 tablet by mouth 2 (two) times daily. Patient not taking: Reported on 07/27/2024    [provider]  bumetanide  (BUMEX ) 1 MG tablet Take 1 tablet (1 mg total) by mouth daily for 10 days. 12/25/22 07/30/24  Teresa Shelba SAUNDERS, NP  Cholecalciferol (VITAMIN D-3) 125 MCG (5000 UT) TABS Take 5,000 Units by mouth daily.    [provider]  MOUNJARO 12.5 MG/0.5ML Pen Inject 12.5 mg into the skin once a week. Tuesdays 06/01/24   [provider]  ofloxacin (FLOXIN) 0.3 % OTIC solution Place 5 drops into both ears 2 (two) times daily. Patient not taking: Reported on 07/27/2024 05/28/24   [provider]  Omega-3 Fatty Acids (FISH OIL) 1200 MG CAPS Take 1,200 mg by mouth daily with breakfast.    [provider]  oxyCODONE -acetaminophen  (PERCOCET) 7.5-325 MG tablet Take 1 tablet by mouth every 6 (six) hours as needed for severe pain (pain score 7-10) (Headaches). 06/30/24   [provider]  pantoprazole  (PROTONIX ) 20 MG tablet Take 20 mg by mouth daily. Patient  not taking: Reported on 07/30/2024 07/14/24   [provider]  pantoprazole  (PROTONIX ) 40 MG tablet Take 1 tablet (40 mg total) by mouth daily. 07/27/24   Szer, Asberry, DO  predniSONE  (DELTASONE ) 20 MG tablet Take 60 mg by mouth daily. Patient not taking: Reported on 07/27/2024 07/14/24   [provider]  predniSONE  (DELTASONE ) 20 MG tablet Take 2.5 tablets (50 mg total) by mouth daily with breakfast for 14 days, THEN 2 tablets (40 mg total) daily with breakfast for 14 days, THEN 1.5 tablets (30 mg total) daily with breakfast for 14 days, THEN 1 tablet  (20 mg total) daily with breakfast for 14 days, THEN 0.5 tablets (10 mg total) daily with breakfast for 14 days. 07/27/24 10/05/24  Szer, Asberry, DO  rivaroxaban  (XARELTO ) 20 MG TABS tablet Take 1 tablet (20 mg total) by mouth at bedtime. 07/22/24   Schuh, McKenzi P, PA-C  sertraline  (ZOLOFT ) 50 MG tablet Take 50 mg by mouth in the morning. 01/01/21   [provider]    Allergies: Acetaminophen -codeine, Bee venom, Meloxicam, Tramadol, Codeine, Coconut (cocos nucifera), and Tomato    Review of Systems  Updated Vital Signs BP (!) 169/95 (BP Location: Right Arm)   Pulse 78   Temp 98.8 F (37.1 C) (Oral)   Resp 16   Ht 5' 5 (1.651 m)   Wt 131.1 kg   SpO2 98%   BMI 48.09 kg/m   Physical Exam Vitals and nursing note reviewed.  Constitutional:      General: She is not in acute distress.    Appearance: She is well-developed.  HENT:     Head: Normocephalic and atraumatic.  Eyes:     Pupils: Pupils are equal, round, and reactive to light.  Cardiovascular:     Rate and Rhythm: Normal rate and regular rhythm.     Pulses: Normal pulses.     Heart sounds: Normal heart sounds. No murmur heard.    No friction rub.  Pulmonary:     Effort: Pulmonary effort is normal.     Breath sounds: Normal breath sounds. No wheezing or rales.  Abdominal:     General: Bowel sounds are normal. There is no distension.     Palpations: Abdomen is soft.     Tenderness: There is no abdominal tenderness. There is no guarding or rebound.  Musculoskeletal:        General: No tenderness. Normal range of motion.     Right lower leg: No edema.     Left lower leg: No edema.     Comments: No edema  Skin:    General: Skin is warm and dry.     Findings: No rash.  Neurological:     Mental Status: She is alert and oriented to person, place, and time.     Cranial Nerves: No cranial nerve deficit.  Psychiatric:        Behavior: Behavior normal.     (all labs ordered are listed, but only abnormal results  are displayed) Labs Reviewed  COMPREHENSIVE METABOLIC PANEL WITH GFR - Abnormal; Notable for the following components:      Result Value   Calcium 11.1 (*)    Total Protein 8.4 (*)    Alkaline Phosphatase 183 (*)    All other components within normal limits  URINALYSIS, ROUTINE W REFLEX MICROSCOPIC - Abnormal; Notable for the following components:   Color, Urine STRAW (*)    APPearance HAZY (*)    Leukocytes,Ua TRACE (*)  Bacteria, UA RARE (*)    All other components within normal limits  LIPASE, BLOOD  CBC    EKG: None  Radiology: CT ABDOMEN PELVIS W CONTRAST Result Date: 09/11/2024 EXAM: CT ABDOMEN AND PELVIS WITH CONTRAST 09/11/2024 04:10:59 PM TECHNIQUE: CT of the abdomen and pelvis was performed with the administration of 100 mL of iohexol  (OMNIPAQUE ) 300 MG/ML solution. Multiplanar reformatted images are provided for review. Automated exposure control, iterative reconstruction, and/or weight-based adjustment of the mA/kV was utilized to reduce the radiation dose to as low as reasonably achievable. COMPARISON: 11/07/2022 CLINICAL HISTORY: early satiety, abd fullness and bloating FINDINGS: LOWER CHEST: Mild cardiomegaly. Multifocal subsegmental atelectasis in the lung bases. LIVER: The liver is unremarkable. GALLBLADDER AND BILE DUCTS: Cholecystectomy. No biliary ductal dilatation. SPLEEN: A couple of small hypodensities in the spleen, unchanged, likely small cysts or hemangiomas. PANCREAS: No acute abnormality. ADRENAL GLANDS: No acute abnormality. KIDNEYS, URETERS AND BLADDER: No stones in the kidneys or ureters. No hydronephrosis. No perinephric or periureteral stranding. The urinary bladder is almost completely decompressed. GI AND BOWEL: Stomach demonstrates no acute abnormality. Normal appendix containing a small appendiculum. Scattered colonic diverticulosis in the descending colon. There is no bowel obstruction. PERITONEUM AND RETROPERITONEUM: No ascites. No free air. No free  pelvic fluid. VASCULATURE: Aorta is normal in caliber. IVC filter in place, apex below the level of the renal vein insertions. LYMPH NODES: No lymphadenopathy. REPRODUCTIVE ORGANS: Hysterectomy. BONES AND SOFT TISSUES: Ventral abdominal wall hernia mesh without associated fluid collection. Persistent diastasis recti in the lower ventral wall. Moderate multilevel lumbar facet arthropathy. Mild grade 1 anterolisthesis of L4-L5. Thoracic disc. No acute osseous abnormality. No focal soft tissue abnormality. IMPRESSION: 1. No acute findings in the abdomen or pelvis. 2. Scattered colonic diverticulosis. No changes of acute diverticulitis. Electronically signed by: Rogelia Myers MD 09/11/2024 05:01 PM EST RP Workstation: [id number]     Procedures   Medications Ordered in the ED  lactated ringers  bolus 1,000 mL (1,000 mLs Intravenous New Bag/Given 09/11/24 1546)  pantoprazole  (PROTONIX ) injection 40 mg (40 mg Intravenous Given 09/11/24 1544)  metoCLOPramide  (REGLAN ) injection 10 mg (10 mg Intravenous Given 09/11/24 1544)  iohexol  (OMNIPAQUE ) 300 MG/ML solution 100 mL (100 mLs Intravenous Contrast Given 09/11/24 1557)                                    Medical Decision Making Amount and/or Complexity of Data Reviewed External Data Reviewed: notes. Labs: ordered. Decision-making details documented in ED Course. Radiology: ordered and independent interpretation performed. Decision-making details documented in ED Course.  Risk Prescription drug management.   Pt with multiple medical problems and comorbidities and presenting today with a complaint that caries a high risk for morbidity and mortality.  Here today with the above complaints.  Concern for mass, gastric outlet obstruction, esophageal stricture, issues with gastric emptying as a cause of patient's symptoms.  Lower suspicion for bowel obstruction at this time.  Patient is having no specific abdominal pain with lesser concern for pancreatitis,  hepatitis.  Also concern for possible electrolyte abnormalities, AKI due to poor oral intake. I independently interpreted patient's labs and UA without acute findings, lipase, CMP, CBC all without acute issues.  Will get a CT to evaluate for the above.  Patient given IV fluids and some nausea medication as she does report she feels a little bit of nausea.  Today is the first time she has had  emesis after trying to eat. I have independently visualized and interpreted pt's images today.  CT without acute findings.  Radiology reports no acute findings and scattered colonic diverticulosis. Discussed the case with Dr. Elicia with [facility] and at this time it seems that patient's symptoms will require outpatient workup.  He did recommend trying sucralfate  1 g twice daily for 4 weeks.  Discussed all this with the patient and her sister.  She was given information for [facility] as well as for Dr. Curtis office that she has seen him in the past.  Discussed with her if she is not interested in eating at least doing protein shakes or things that can ensure that she is getting plenty of nutrients.  At this time she appears stable for discharge.     Final diagnoses:  Weight loss  Early satiety    ED Discharge Orders          Ordered    sucralfate  (CARAFATE ) 1 g tablet  2 times daily        09/11/24 1813               Doretha Folks, MD 09/11/24 1819  "

## 2024-09-14 ENCOUNTER — Ambulatory Visit

## 2024-09-14 VITALS — BP 121/68 | HR 74 | Temp 97.0°F | Resp 14 | Ht 65.0 in | Wt 288.8 lb

## 2024-09-14 DIAGNOSIS — R700 Elevated erythrocyte sedimentation rate: Secondary | ICD-10-CM

## 2024-09-14 DIAGNOSIS — Z7952 Long term (current) use of systemic steroids: Secondary | ICD-10-CM | POA: Diagnosis not present

## 2024-09-14 DIAGNOSIS — Z79899 Other long term (current) drug therapy: Secondary | ICD-10-CM

## 2024-09-14 DIAGNOSIS — B999 Unspecified infectious disease: Secondary | ICD-10-CM

## 2024-09-14 DIAGNOSIS — R7982 Elevated C-reactive protein (CRP): Secondary | ICD-10-CM | POA: Diagnosis not present

## 2024-09-15 ENCOUNTER — Ambulatory Visit (HOSPITAL_COMMUNITY): Admitting: Licensed Clinical Social Worker

## 2024-09-15 ENCOUNTER — Telehealth: Payer: Self-pay | Admitting: Podiatry

## 2024-09-15 DIAGNOSIS — F4310 Post-traumatic stress disorder, unspecified: Secondary | ICD-10-CM

## 2024-09-15 DIAGNOSIS — F331 Major depressive disorder, recurrent, moderate: Secondary | ICD-10-CM | POA: Diagnosis not present

## 2024-09-15 DIAGNOSIS — F411 Generalized anxiety disorder: Secondary | ICD-10-CM

## 2024-09-15 NOTE — Progress Notes (Signed)
 Virtual Visit via Video Note   I connected with Glendene S. Radoncic on 09/15/24 at 1:00pm by video enabled telemedicine application and verified that I am speaking with the correct person using two identifiers.   I discussed the limitations, risks, security and privacy concerns of performing an evaluation and management service by video and the availability of in person appointments. I also discussed with the patient that there may be a patient responsible charge related to this service. The patient expressed understanding and agreed to proceed.   I discussed the assessment and treatment plan with the patient. The patient was provided an opportunity to ask questions and all were answered. The patient agreed with the plan and demonstrated an understanding of the instructions.   The patient was advised to call back or seek an in-person evaluation if the symptoms worsen or if the condition fails to improve as anticipated.   I provided 59 minutes of non-face-to-face time during this encounter.   [name], LCSW, LCAS _______________________ THERAPIST PROGRESS NOTE   Session Time:  1:00pm - 1:59pm     Location: Patient: Patient home Provider: [facility]   Participation Level: Active    Behavioral Response: Alert, casually dressed, euthymic mood/affect   Type of Therapy:  Individual Therapy   Treatment Goals addressed: Mood management; Medication management; Improving boundaries with support network    Progress Towards Goals: Progressing   Interventions: CBT, supportive therapy, strengths based    Summary: Kayleen S. Buckman is a 58 year old divorced African American female that presented for therapy appointment today with diagnoses of Major Depressive Disorder, recurrent, moderate, Generalized Anxiety Disorder and PTSD.      Suicidal/Homicidal: None; without plan or intent                                                                                                                 Therapist Response:  Clinician met with Kersti for virtual therapy appointment and assessed for safety, sobriety, and medication compliance.  Diana presented for session on time and was alert, oriented x5, with no evidence or self-report of active SI/HI or A/V H.  Camera reported ongoing compliance with medication and denied any abuse of alcohol or illicit substances.  Clinician inquired about Mackensey's current emotional ratings, as well as any significant changes in thoughts, feelings, or behavior since previous check-in.  Linette reported scores of 0/10 for depression, 0/10 for anxiety, and 0/10 for anger/irritability.  She denied any recent panic attacks or outbursts. Larena reported that a recent struggle has been having trouble with her appetite, which has included eating sparingly, and throwing up after meals.  Daliana reported that she has been having stomach issues for over 1 month and during the holidays family became concerned any took her to the ED on 09/11/24.  Wilbert reported that they said she has a combination issue due to her esophagus and stomach, so she now has an appointment to see a specialist on January 5th and is on a new medication.  Shenay reported  that an additional struggle has been missing an aunt that passed away recently, in addition to reflecting on other people like her grandmother she cannot spend the holidays with. Clinician proposed discussion on topic of grief and loss to assist. Clinician explained how it is normal to experience grief during the holidays, as individuals may reflect on family, friends, or other loved ones that are no longer around, and miss them.  Clinician offered healthy strategies for coping during the holidays, and processing these feelings in healthy ways, such as creating new traditions to memorialize those that can't be present, engaging in gratitude practice to identify sources of joy, being more gentle with herself, taking timeouts to cry and express other strong  emotions when feeling overwhelmed, staying engaged with positive supports that can empathize, and more.  Clinician inquired about what Maliya has already tried, or could implement next year to cope more effectively.  Intervention was effective, as evidenced by [name] actively engaging in discussion on subject, reporting that she is trying to be more open with family to express difficult thoughts and feelings, such as grief.  She reported that 'going through the motions' and putting on an 'emotional mask' could lead her to ignore both physical and mental health concerns, so she has contemplated seeking a family therapist to work with her direct family and promote healthier mutual coping strategies and communication.  She stated I don't tell them everything I have going on, and I really need to work on that.  Clinician will continue to monitor.          Plan: Follow up again in 1-2 weeks.    Diagnosis: Major depressive disorder, recurrent, moderate; Generalized Anxiety Disorder; and PTSD    Collaboration of Care:   No collaboration of care required for this visit.                                                   Patient/Guardian was advised Release of Information must be obtained prior to any record release in order to collaborate their care with an outside provider. Patient/Guardian was advised if they have not already done so to contact the registration department to sign all necessary forms in order for us  to release information regarding their care.    Consent: Patient/Guardian gives verbal consent for treatment and assignment of benefits for services provided during this visit. Patient/Guardian expressed understanding and agreed to proceed.   [name], LCSW, LCAS 09/15/24

## 2024-09-15 NOTE — Telephone Encounter (Signed)
 Called and scheduled patient for surgery on 10/12/2024. Patient is on blood thinners and has been given instructions for discontinuing. Patient not on any GLP1 medicaitons. Pharmacy is correct.

## 2024-09-16 ENCOUNTER — Ambulatory Visit (INDEPENDENT_AMBULATORY_CARE_PROVIDER_SITE_OTHER)

## 2024-09-16 ENCOUNTER — Encounter (INDEPENDENT_AMBULATORY_CARE_PROVIDER_SITE_OTHER): Payer: Self-pay

## 2024-09-16 VITALS — Ht 65.0 in | Wt 287.0 lb

## 2024-09-16 DIAGNOSIS — R040 Epistaxis: Secondary | ICD-10-CM

## 2024-09-16 DIAGNOSIS — J342 Deviated nasal septum: Secondary | ICD-10-CM

## 2024-09-16 DIAGNOSIS — H65195 Other acute nonsuppurative otitis media, recurrent, left ear: Secondary | ICD-10-CM | POA: Diagnosis not present

## 2024-09-16 DIAGNOSIS — H6123 Impacted cerumen, bilateral: Secondary | ICD-10-CM

## 2024-09-16 DIAGNOSIS — Z7901 Long term (current) use of anticoagulants: Secondary | ICD-10-CM

## 2024-09-16 DIAGNOSIS — J340 Abscess, furuncle and carbuncle of nose: Secondary | ICD-10-CM

## 2024-09-16 MED ORDER — AYR SALINE NASAL NA GEL: 1.0000 | Freq: Every evening | NASAL | 0 refills | Status: AC

## 2024-09-16 MED ORDER — OXYMETAZOLINE HCL 0.05 % NA SOLN: 1.0000 | NASAL | 0 refills | Status: AC | PRN

## 2024-09-16 MED ORDER — MUPIROCIN 2 % EX OINT: 1.0000 | TOPICAL_OINTMENT | Freq: Two times a day (BID) | CUTANEOUS | 0 refills | Status: AC

## 2024-09-16 NOTE — Progress Notes (Signed)
 Dear Dr. Claude, Here is my assessment for our mutual patient, Meghan [name]. Thank you for allowing me the opportunity to care for your patient. Please do not hesitate to contact me should you have any other questions. Sincerely, Dr. Hadassah Parody  Otolaryngology Clinic Note Referring provider: Dr. Claude HPI:   Initial HPI (09/16/2024) 58 year old female with history of pulmonary embolus status post IVC filter on Xarelto , type 2 diabetes, atrial fibrillation, CHF, stroke who presents with epistaxis.   She has had daily episodes of left-sided epistaxis for approximately 1 year.  Episodes last 3 to 5 minutes.  Bleeding is controlled with direct pressure and wiping white blood.  She has never used Flonase.  She uses a humidifier at home but also uses a fan at nighttime because she says that she will get too hot she does not have a fan on.  No history of digital trauma to the area.  She is on Xarelto  for anticoagulation.   She also reports recurrent ear infections.  On the left side she had what sounds like an otitis externa where her entire left ear and left face were affected.  She then had an episode of otitis media.  Both these resolved with antibiotics.  She then had an episode on the right side as well.  Wants to make sure no concerns on today's exam.  Independent Review of Additional Tests or Records:  Referral note 08/10/24 Sydelle Pool, NP: Notes recent infection ear on the left side treated with antibiotics.  No epistaxis at the time.  ED note 08/26/2024 Raynell Naegeli, MD: Patient presented with unresponsiveness, strokelike episode  ED note 07/30/2024 Sharlet Linden, MD: Seen for chest pain   PMH/Meds/All/SocHx/FamHx/ROS:   Past Medical History:  Diagnosis Date   (HFpEF) heart failure with preserved ejection fraction (HCC) 01/2019   a.) Dx'd in [place]; b.) TTE 08/14/2022: EF 60-65%, mod LVH, mild LAE, triv MR   Anxiety    Asthma 05/69   Atrial fibrillation and  flutter (HCC) 02/16/2019   a.) CHA2DS2VASc = 6 (sex, HFpEF, HTN, CVA x2, T2DM);  b.) s/p DCCV (200 J x 1) 08/22/2022; c.) s/p RF ablation (CTI) 11/02/2022; d.) rate/rhythm maintained on oral flecainide  + metoprolol  succinate; chronically anticoagulated with rivaroxaban    CHF (congestive heart failure) (HCC) 01/2019   Class 3 severe obesity due to excess calories with body mass index (BMI) of 50.0 to 59.9 in adult [facility])    DDD (degenerative disc disease), lumbar    Dysrhythmia    A. Fib   GERD (gastroesophageal reflux disease)    Heart murmur 07/2017   Hiatal hernia    History of head injury    HTN (hypertension)    Hypercholesterolemia    Ileus (HCC)    Intractable nausea and vomiting 11/16/2020   Long term current use of anticoagulant    a.) rivaroxaban    Macular degeneration of left eye    MDD (major depressive disorder)    OA (osteoarthritis)    OSA on CPAP    Pneumonia    Presence of IVC filter    PTSD (post-traumatic stress disorder)    Recurrent pulmonary emboli (HCC)    a.) s/p IVC filter placement 2003; b.) previously followed by hematology when living in [place] , [place]   Seizure disorder [facility])    a.) last was in 2020 per pt report   Stroke (HCC) 02/1995   1989 and 1995 (left sided weakness)   T2DM (type 2 diabetes mellitus) (HCC)    Vitamin  D deficiency      Past Surgical History:  Procedure Laterality Date   A-FLUTTER ABLATION N/A 11/02/2022   Procedure: A-FLUTTER ABLATION;  Surgeon: Mealor, Eulas BRAVO, MD;  [facility]: [facility];  Service: Cardiovascular;  Laterality: N/A;   ARTERY BIOPSY Left 07/21/2024   Procedure: BIOPSY TEMPORAL ARTERY;  Surgeon: Pearline Norman RAMAN, MD;  [facility];  Service: Vascular;  Laterality: Left;   ARTHRODESIS METATARSALPHALANGEAL JOINT (MTPJ) Left 02/25/2023   Procedure: ARTHRODESIS METATARSALPHALANGEAL JOINT (MTPJ);  Surgeon: Tobie Franky SQUIBB, DPM;  Location: [facility] ORS;  Service: Podiatry;  Laterality: Left;   CARDIAC  CATHETERIZATION  2017   in [facility] in [place]   CARDIOVERSION N/A 08/22/2022   Procedure: CARDIOVERSION;  Surgeon: Sheena Pugh, DO;  Location: [facility] ENDOSCOPY;  Service: Cardiovascular;  Laterality: N/A;   CESAREAN SECTION N/A 1989   CESAREAN SECTION N/A 1993   CHOLECYSTECTOMY  2003   COLONOSCOPY WITH PROPOFOL  N/A 06/05/2019   Procedure: COLONOSCOPY WITH PROPOFOL ;  Surgeon: Rollin Dover, MD;  Location: [facility] ENDOS[facility];  Service: Endoscopy;  Laterality: N/A;   CYST REMOVAL WITH BONE GRAFT Left 10/18/2020   Procedure: BONE GRAFTING OF ENCHONDROMA MIDDLE PHANLANX OF LEFT MIDDLE FINGER;  Surgeon: Murrell Kuba, MD;  Location: [facility] OR;  Service: Orthopedics;  Laterality: Left;  AXILLARY BLOCK   DIAGNOSTIC LAPAROSCOPY  2015; 2017   lap hernia repair x2   ENDOVENOUS ABLATION SAPHENOUS VEIN W/ LASER Left 08/31/2021   endovenous laser ablation left greater saphenous vein and stab phlebectomy 10-20 incisions left leg by Penne Colorado MD   HALLUX FUSION Right 05/28/2022   Procedure: HALLUX FUSION METATARSAL PHALANGEAL JOIT;  Surgeon: Tobie Franky SQUIBB, DPM;  Location: [facility];  Service: Podiatry;  Laterality: Right;  BLOCK   HERNIA REPAIR  2014, 2018   umbilical hernia repair   IVC FILTER INSERTION  2003   POLYPECTOMY  06/05/2019   Procedure: POLYPECTOMY;  Surgeon: Rollin Dover, MD;  Location: [facility];  Service: Endoscopy;;   SHOULDER ARTHROSCOPY W/ ROTATOR CUFF REPAIR Right 08/28/2017   TOTAL ABDOMINAL HYSTERECTOMY  2003   UPPER GI ENDOSCOPY     WRIST ARTHROSCOPY WITH DEBRIDEMENT Left 10/18/2020   Procedure: LEFT WRIST ARTHROSCOPY WITH DEBRIDEMENT;  Surgeon: Murrell Kuba, MD;  Location: [facility] OR;  Service: Orthopedics;  Laterality: Left;  AXILLARY BLOCK   WRIST ARTHROSCOPY WITH DEBRIDEMENT Right 01/25/2022   Procedure: ARTHROSCOPY RIGHT WRIST WITH  DEBRIDEMENT/ SHRINKAGE;  Surgeon: Murrell Franky, MD;  Location: [facility] OR;  Service: Orthopedics;  Laterality: Right;    Family  History  Problem Relation Age of Onset   Breast cancer Mother    Heart attack Father    Diabetes Father    Congestive Heart Failure Father    Clotting disorder Father    Heart disease Father    Heart failure Father    Hypertension Father    Heart failure Brother    Breast cancer Maternal Grandmother    Arrhythmia Paternal Grandmother      Social Connections: Socially Integrated (09/15/2024)   Received from [facility]   Social Network    How would you rate your social network (family, work, friends)?: Good participation with social networks     Current Outpatient Medications  Medication Instructions   albuterol  (VENTOLIN  HFA) 108 (90 Base) MCG/ACT inhaler 2 puffs, Every 6 hours PRN   amitriptyline (ELAVIL) 50 mg, Daily at bedtime   amoxicillin-clavulanate (AUGMENTIN) 875-125 MG tablet 1 tablet, 2 times daily   bumetanide  (BUMEX )  1 mg, Oral, Daily   Fish Oil 1,200 mg, Daily with breakfast   Mounjaro 12.5 mg, Weekly   mupirocin ointment (BACTROBAN) 2 % 1 Application, Topical, 2 times daily   ofloxacin (FLOXIN) 0.3 % OTIC solution 5 drops, 2 times daily   oxyCODONE -acetaminophen  (PERCOCET) 7.5-325 MG tablet 1 tablet, Every 6 hours PRN   oxymetazoline (AFRIN NASAL SPRAY) 0.05 % nasal spray 1 spray, Each Nare, As needed   pantoprazole  (PROTONIX ) 20 mg, Daily   pantoprazole  (PROTONIX ) 40 mg, Oral, Daily   predniSONE  (DELTASONE ) 20 MG tablet Take 2.5 tablets (50 mg total) by mouth daily with breakfast for 14 days, THEN 2 tablets (40 mg total) daily with breakfast for 14 days, THEN 1.5 tablets (30 mg total) daily with breakfast for 14 days, THEN 1 tablet (20 mg total) daily with breakfast for 14 days, THEN 0.5 tablets (10 mg total) daily with breakfast for 14 days.   predniSONE  (DELTASONE ) 60 mg, Daily   rivaroxaban  (XARELTO ) 20 mg, Oral, Daily at bedtime   saline (AYR) GEL 1 Application, Each Nare, Nightly   sertraline  (ZOLOFT ) 50 mg, Every morning   sucralfate  (CARAFATE ) 1 g,  Oral, 2 times daily   Vitamin D-3 5,000 Units, Daily     Physical Exam:   Ht 5' 5 (1.651 m)   Wt 287 lb (130.2 kg)   BMI 47.76 kg/m   Salient findings:  CN II-XII intact   Bilateral EAC impacted with cerumen.  Following removal, bilateral TM intact with well pneumatized middle ear spaces  Anterior rhinoscopy: Septum deviated slightly leftward anteriorly Nasal endoscopy was indicated to better evaluate the nose and paranasal sinuses, given the patient's history and exam findings, and is detailed below.  No lesions of oral cavity/oropharynx  No obviously palpable neck masses/lymphadenopathy/thyromegaly  No respiratory distress or stridor  Seprately Identifiable Procedures:  Prior to initiating any procedures, risks/benefits/alternatives were explained to the patient and verbal consent obtained.  PROCEDURE (09/16/2024): Bilateral Diagnostic Rigid Nasal Endoscopy Pre-procedure diagnosis: Concern for recurrent epistaxis Post-procedure diagnosis: same Indication: See pre-procedure diagnosis and physical exam above Complications: None apparent EBL: 0 mL Anesthesia: Lidocaine  4% and topical decongestant was topically sprayed in each nasal cavity  Description of Procedure:  Patient was identified. A rigid 30 degree endoscope was utilized to evaluate the sinonasal cavities, mucosa, sinus ostia and turbinates and septum.  Overall, signs of mucosal inflammation are noted.  Also noted are healing ulcer on the left nasal septum, leftward nasal septal deviation.  No mucopurulence, polyps, or masses noted.   Right Middle meatus: Clear Right SE Recess: Clear Left MM: Clear Left SE Recess: Clear Photodocumentation was obtained.  CPT CODE -- 68768 - Mod 25  Procedure (09/16/2024): Bilateral ear microscopy and cerumen removal using microscope (CPT [phone]) - Mod 25,50 Pre-procedure diagnosis: Bilateral cerumen impaction  Post-procedure diagnosis: same Indication: Bilateral cerumen  impaction; given patient's otologic complaints and history as well as for improved and comprehensive examination of external ear and tympanic membrane, bilateral otologic examination using microscope was performed and impacted cerumen removed  Procedure: Patient was placed semi-recumbent. Both ear canals were examined using the microscope with findings above. Cerumen removed on left and on right using suction and currette with improvement in EAC examination and patency.  See findings above      Impression & Plans:  Meghan [name] is a 58 y.o. female with   1. Bilateral impacted cerumen   2. Nasal septal ulcer   3. Recurrent epistaxis   4. Other recurrent  acute nonsuppurative otitis media of left ear     Assessment and Plan Assessment & Plan Recurrent epistaxis secondary to nasal septal ulcer and deviated septum She experiences daily episodes of epistaxis for approximately one year, predominantly from the left side. Etiology is multifactorial, including chronic Xarelto  use, and environmental factors such as dry air. Examination demonstrated a leftward deviated septum and a small, healing septal ulcer. Cauterization was deferred due to risk of further bleeding and disruption of healing tissue. She remains at increased risk for recurrent epistaxis due to anticoagulation  - Prescribed intranasal antibiotic ointment for 7 days to promote ulcer healing. - Prescribed saline gel (air gel) for nightly use after completion of antibiotic ointment to maintain mucosal hydration. - Prescribed Afrin nasal spray for use during active epistaxis; instructed to use two large sprays and apply pressure for five minutes. - Provided education on epistaxis management, emphasizing proper compression technique and avoidance of clot disruption. - Advised continued use of humidifier to maintain nasal moisture. - Provided anticipatory guidance regarding increased risk of epistaxis due to Xarelto  and age-related  changes. - Instructed to return for follow-up if epistaxis worsens or fails to improve with current management. - Sent prescriptions to her preferred pharmacy.  Recurrent otitis externa and otitis media Bilateral cerumen impaction She has recurrent bilateral otitis externa and otitis media over the past year, with recent symptomatic improvement on prescribed eardrops. Examination revealed healthy tympanic membranes without evidence of active infection or chronic damage. - Performed cerumen removal from both ear canals to facilitate examination. - Counseled to avoid self-cleaning of ears with instruments to reduce risk of trauma and infection. - Provided reassurance regarding absence of active infection or chronic damage on current examination. - Instructed to monitor for recurrence of symptoms and return for evaluation if otalgia or signs of infection recur.   See below regarding exact medications prescribed this encounter including dosages and route: Meds ordered this encounter  Medications   saline (AYR) GEL    Sig: Place 1 Application into both nostrils at bedtime.    Dispense:  14.1 g    Refill:  0   oxymetazoline (AFRIN NASAL SPRAY) 0.05 % nasal spray    Sig: Place 1 spray into both nostrils as needed (nose bleeds).    Dispense:  30 mL    Refill:  0   mupirocin ointment (BACTROBAN) 2 %    Sig: Apply 1 Application topically 2 (two) times daily for 7 days.    Dispense:  14 g    Refill:  0      Thank you for allowing me the opportunity to care for your patient. Please do not hesitate to contact me should you have any other questions.  Sincerely, Hadassah Parody, MD Otolaryngologist (ENT), [facility] Phone: [phone] Fax: [phone]  MDM:  Level 4 Complexity/Problems addressed: 4-chronic worsening problem Data complexity: 4-  independent review of 3 notes - Morbidity: 4-prescription drug management - Prescription Drug prescribed or managed: Yes'

## 2024-09-23 ENCOUNTER — Ambulatory Visit (INDEPENDENT_AMBULATORY_CARE_PROVIDER_SITE_OTHER): Admitting: Podiatry

## 2024-09-23 DIAGNOSIS — Z79899 Other long term (current) drug therapy: Secondary | ICD-10-CM

## 2024-09-23 DIAGNOSIS — B351 Tinea unguium: Secondary | ICD-10-CM

## 2024-09-23 NOTE — Progress Notes (Signed)
 "  Subjective:  Patient ID: Meghan [name], female    DOB: [birth year],  MRN: [id number]  Chief Complaint  Patient presents with   Nail Problem    Pt stated that her nails are starting to become a different color     59 y.o. female presents with the above complaint.  Patient present with new complaint of onychomycosis toenails x 10 she would like to discuss treatment options for has not seen and was prior to seeing me for this.  She has a surgery scheduled later this month as well.  Denies any other acute issues.   Review of Systems: Negative except as noted in the HPI. Denies N/V/F/Ch.  Past Medical History:  Diagnosis Date   (HFpEF) heart failure with preserved ejection fraction (HCC) 01/2019   a.) Dx'd in [place]; b.) TTE 08/14/2022: EF 60-65%, mod LVH, mild LAE, triv MR   Anxiety    Asthma 05/69   Atrial fibrillation and flutter (HCC) 02/16/2019   a.) CHA2DS2VASc = 6 (sex, HFpEF, HTN, CVA x2, T2DM);  b.) s/p DCCV (200 J x 1) 08/22/2022; c.) s/p RF ablation (CTI) 11/02/2022; d.) rate/rhythm maintained on oral flecainide  + metoprolol  succinate; chronically anticoagulated with rivaroxaban    CHF (congestive heart failure) (HCC) 01/2019   Class 3 severe obesity due to excess calories with body mass index (BMI) of 50.0 to 59.9 in adult [facility])    DDD (degenerative disc disease), lumbar    Dysrhythmia    A. Fib   GERD (gastroesophageal reflux disease)    Heart murmur 07/2017   Hiatal hernia    History of head injury    HTN (hypertension)    Hypercholesterolemia    Ileus (HCC)    Intractable nausea and vomiting 11/16/2020   Long term current use of anticoagulant    a.) rivaroxaban    Macular degeneration of left eye    MDD (major depressive disorder)    OA (osteoarthritis)    OSA on CPAP    Pneumonia    Presence of IVC filter    PTSD (post-traumatic stress disorder)    Recurrent pulmonary emboli (HCC)    a.) s/p IVC filter placement 2003; b.) previously followed by hematology  when living in [place] , [place]   Seizure disorder (HCC)    a.) last was in 2020 per pt report   Stroke (HCC) 02/1995   1989 and 1995 (left sided weakness)   T2DM (type 2 diabetes mellitus) (HCC)    Vitamin D deficiency     Current Outpatient Medications:    albuterol  (VENTOLIN  HFA) 108 (90 Base) MCG/ACT inhaler, Inhale 2 puffs into the lungs every 6 (six) hours as needed for wheezing or shortness of breath., Disp: , Rfl:    amitriptyline (ELAVIL) 50 MG tablet, Take 50 mg by mouth at bedtime., Disp: , Rfl:    amoxicillin-clavulanate (AUGMENTIN) 875-125 MG tablet, Take 1 tablet by mouth 2 (two) times daily. (Patient not taking: Reported on 07/27/2024), Disp: , Rfl:    bumetanide  (BUMEX ) 1 MG tablet, Take 1 tablet (1 mg total) by mouth daily for 10 days., Disp: 10 tablet, Rfl: 0   Cholecalciferol (VITAMIN D-3) 125 MCG (5000 UT) TABS, Take 5,000 Units by mouth daily., Disp: , Rfl:    MOUNJARO 12.5 MG/0.5ML Pen, Inject 12.5 mg into the skin once a week. Tuesdays (Patient taking differently: Inject 12.5 mg into the skin once a week. Tuesdays   On hold for gastro purpose), Disp: , Rfl:    mupirocin  ointment (BACTROBAN )  2 %, Apply 1 Application topically 2 (two) times daily for 7 days., Disp: 14 g, Rfl: 0   ofloxacin (FLOXIN) 0.3 % OTIC solution, Place 5 drops into both ears 2 (two) times daily. (Patient not taking: Reported on 07/27/2024), Disp: , Rfl:    Omega-3 Fatty Acids (FISH OIL) 1200 MG CAPS, Take 1,200 mg by mouth daily with breakfast., Disp: , Rfl:    oxyCODONE -acetaminophen  (PERCOCET) 7.5-325 MG tablet, Take 1 tablet by mouth every 6 (six) hours as needed for severe pain (pain score 7-10) (Headaches)., Disp: , Rfl:    oxymetazoline  (AFRIN NASAL SPRAY) 0.05 % nasal spray, Place 1 spray into both nostrils as needed (nose bleeds)., Disp: 30 mL, Rfl: 0   pantoprazole  (PROTONIX ) 20 MG tablet, Take 20 mg by mouth daily. (Patient not taking: Reported on 07/30/2024), Disp: , Rfl:    pantoprazole   (PROTONIX ) 40 MG tablet, Take 1 tablet (40 mg total) by mouth daily., Disp: 90 tablet, Rfl: 0   predniSONE  (DELTASONE ) 20 MG tablet, Take 60 mg by mouth daily. (Patient not taking: Reported on 07/27/2024), Disp: , Rfl:    predniSONE  (DELTASONE ) 20 MG tablet, Take 2.5 tablets (50 mg total) by mouth daily with breakfast for 14 days, THEN 2 tablets (40 mg total) daily with breakfast for 14 days, THEN 1.5 tablets (30 mg total) daily with breakfast for 14 days, THEN 1 tablet (20 mg total) daily with breakfast for 14 days, THEN 0.5 tablets (10 mg total) daily with breakfast for 14 days., Disp: 105 tablet, Rfl: 0   rivaroxaban  (XARELTO ) 20 MG TABS tablet, Take 1 tablet (20 mg total) by mouth at bedtime., Disp: , Rfl:    saline (AYR) GEL, Place 1 Application into both nostrils at bedtime., Disp: 14.1 g, Rfl: 0   sertraline  (ZOLOFT ) 50 MG tablet, Take 50 mg by mouth in the morning., Disp: , Rfl:    sucralfate  (CARAFATE ) 1 g tablet, Take 1 tablet (1 g total) by mouth 2 (two) times daily., Disp: 56 tablet, Rfl: 0  Social History   Tobacco Use  Smoking Status Never   Passive exposure: Past  Smokeless Tobacco Never    Allergies  Allergen Reactions   Acetaminophen -Codeine Swelling, Rash and Other (See Comments)    Tylenol  with Codeine, Tylenol  #3,  (facial swelling, hives)   Bee Venom Anaphylaxis   Meloxicam Anaphylaxis and Rash   Tramadol Hives and Other (See Comments)    Patient stated I was trippin' and I do not want that ever again   Codeine Hives and Swelling   Coconut (Cocos Nucifera) Rash and Other (See Comments)    ANY coconut products    Tomato Rash   Objective:  There were no vitals filed for this visit. There is no height or weight on file to calculate BMI. Constitutional Well developed. Well nourished.  Vascular Dorsalis pedis pulses palpable bilaterally. Posterior tibial pulses palpable bilaterally. Capillary refill normal to all digits.  No cyanosis or clubbing noted. Pedal  hair growth normal.  Neurologic Normal speech. Oriented to person, place, and time. Epicritic sensation to light touch grossly present bilaterally.  Dermatologic Nails well groomed and normal in appearance. No open wounds. No skin lesions.  Orthopedic: Pain on palpation to the left ankle pain with range of motion of the ankle joint deep intra-articular ankle pain noted.  No pain at the posterior tibial peroneal ATFL ligament no pain at the Achilles tendon  Pain along the course of the posterior tibial tendon pain with resisted dorsiflexion  and eversion of the foot no pain with   Radiographs: Pes planovalgus 3 views of skeletally mature adult left ankle: 3 views of skeletally mature the left ankle: No osteoarthritis noted mild midfoot changes noted.  Pes planovalgus noted.  No other bony abnormalities identified.  IMPRESSION: Mild degenerative changes without accelerated arthrosis. No acute abnormality. Suggestion of mild pes planus deformity.   A few small chronic ossicles are adjacent to the tip of the medial malleolus likely related to chronic injury.   Mild tenosynovitis of the distal posterior tibial tendon. No significant tendinosis is present. Assessment:   1. Long-term use of high-risk medication   2. Onychomycosis due to dermatophyte     Plan:  Patient was evaluated and treated and all questions answered.  Left ankle synovitis with left posterior tibial tendinitis pain - All questions and concerns were discussed with the patient in extensive detail MRI was reviewed with the patient in extensive detail which does show some mild osteoarthritic.  Given the amount of pain that she is still having she would benefit from ankle arthroscopy to help clean out the joint evaluate the ankle joint.  She has failed all conservative care including shoe gear modification padding protecting offloading of which at this time she would benefit from surgical intervention.  She states understanding  like to proceed with surgery -She would also benefit from left posterior tibial tendon repair and evaluation as she is also hurting along the course of the posterior tibial tendon on MRI it appears to be mild tenosynovitis but could likely be more worse than expected.  At this time she would benefit from exploratory posterior tibial tendon repair and evaluation I discussed this with the patient she states that she would like to proceed with surgery -Informed surgical risk consent was reviewed and read aloud to the patient.  I reviewed the films.  I have discussed my findings with the patient in great detail.  I have discussed all risks including but not limited to infection, stiffness, scarring, limp, disability, deformity, damage to blood vessels and nerves, numbness, poor healing, need for braces, arthritis, chronic pain, amputation, death.  All benefits and realistic expectations discussed in great detail.  I have made no promises as to the outcome.  I have provided realistic expectations.  I have offered the patient a 2nd opinion, which they have declined and assured me they preferred to proceed despite the risks  Onychomycosis toenails x 10 yeah -Educated the patient on the etiology of onychomycosis and various treatment options associated with improving the fungal load.  I explained to the patient that there is 3 treatment options available to treat the onychomycosis including topical, p.o., laser treatment.  Patient elected to undergo p.o. options with Lamisil /terbinafine  therapy.  In order for me to start the medication therapy, I explained to the patient the importance of evaluating the liver and obtaining the liver function test.  Once the liver function test comes back normal I will start him on 76-month course of Lamisil  therapy.  Patient understood all risk and would like to proceed with Lamisil  therapy.  I have asked the patient to immediately stop the Lamisil  therapy if she has any reactions to it  and call the office or go to the emergency room right away.  Patient states understanding   "

## 2024-09-26 LAB — HEPATIC FUNCTION PANEL
ALT: 8 IU/L (ref 0–32)
AST: 7 IU/L (ref 0–40)
Albumin: 4.3 g/dL (ref 3.8–4.9)
Alkaline Phosphatase: 204 IU/L — ABNORMAL HIGH (ref 49–135)
Bilirubin Total: 0.4 mg/dL (ref 0.0–1.2)
Bilirubin, Direct: 0.1 mg/dL (ref 0.00–0.40)
Total Protein: 7.4 g/dL (ref 6.0–8.5)

## 2024-09-28 ENCOUNTER — Ambulatory Visit (HOSPITAL_COMMUNITY): Admitting: Licensed Clinical Social Worker

## 2024-09-28 ENCOUNTER — Other Ambulatory Visit: Payer: Self-pay | Admitting: Podiatry

## 2024-09-28 MED ORDER — TERBINAFINE HCL 250 MG PO TABS: 250.0000 mg | ORAL_TABLET | Freq: Every day | ORAL | 0 refills | Status: AC

## 2024-09-30 ENCOUNTER — Ambulatory Visit (INDEPENDENT_AMBULATORY_CARE_PROVIDER_SITE_OTHER): Admitting: Licensed Clinical Social Worker

## 2024-09-30 DIAGNOSIS — F4310 Post-traumatic stress disorder, unspecified: Secondary | ICD-10-CM | POA: Diagnosis not present

## 2024-09-30 DIAGNOSIS — F331 Major depressive disorder, recurrent, moderate: Secondary | ICD-10-CM | POA: Diagnosis not present

## 2024-09-30 DIAGNOSIS — F411 Generalized anxiety disorder: Secondary | ICD-10-CM

## 2024-09-30 NOTE — Progress Notes (Signed)
 Virtual Visit via Video Note   I connected with Kristalynn S. Diamond on 09/30/24 at 2:00pm by video enabled telemedicine application and verified that I am speaking with the correct person using two identifiers.   I discussed the limitations, risks, security and privacy concerns of performing an evaluation and management service by video and the availability of in person appointments. I also discussed with the patient that there may be a patient responsible charge related to this service. The patient expressed understanding and agreed to proceed.   I discussed the assessment and treatment plan with the patient. The patient was provided an opportunity to ask questions and all were answered. The patient agreed with the plan and demonstrated an understanding of the instructions.   The patient was advised to call back or seek an in-person evaluation if the symptoms worsen or if the condition fails to improve as anticipated.   I provided 43 minutes of non-face-to-face time during this encounter.   [name], LCSW, LCAS _______________________ THERAPIST PROGRESS NOTE   Session Time:  2:00pm - 2:43pm      Location: Patient: Patient home Provider: Home Office   Participation Level: Active    Behavioral Response: Alert, casually dressed, euthymic mood/affect   Type of Therapy:  Individual Therapy   Treatment Goals addressed: Mood management; Medication management   Progress Towards Goals: Progressing   Interventions: CBT: challenging anxious thoughts    Summary: Laquitta S. Paget is a 59 year old divorced African American female that presented for therapy appointment today with diagnoses of Major Depressive Disorder, recurrent, moderate, Generalized Anxiety Disorder and PTSD.      Suicidal/Homicidal: None; without plan or intent                                                                                                                Therapist Response:  Clinician met with Reggie for virtual  therapy session and assessed for safety, sobriety, and medication compliance.  Wyolene presented for appointment on time and was alert, oriented x5, with no evidence or self-report of active SI/HI or A/V H.  Avika reported ongoing compliance with medication and denied any abuse of alcohol or illicit substances.  Clinician inquired about Tiawana's emotional ratings today, as well as any significant changes in thoughts, feelings, or behavior since last check-in.  Canary reported scores of 0/10 for depression, 0/10 for anxiety, and 0/10 for anger/irritability.  She denied any recent panic attacks or outbursts. Mellony reported that a recent struggle was learning that her sister has cancer and will need to have surgery, which has her worried, stating I want to be there for her.  Clinician utilized handout in session today titled Worry exploration in order to assist Aranda in reducing her anxiety related to her sister's health.  This worksheet featured a series of Socratic questions aimed at exploring the most likely outcomes for a situation of concern, rather than focusing on the worst possible outcome (i.e. catastrophizing).  Clinician assisted Kamica in identifying and challenging any irrational beliefs related  to this worry, in addition to utilizing problem solving approach to explore strategies which would help her accomplish goal of assisting her sister without becoming overwhelmed.  Itzell actively participated in discussion on handout, reporting that she is worried that her sister could pass away from this cancer if it gets worse.  Camarie reported that there is sufficient evidence to suggest her sister will be okay, as this was detected early, she already has a surgery scheduled, and several family members and church members have stepped in to assist with caregiving and support.  Intervention was effective, as evidenced by Brocha reporting that discussion on this subject provided reassurance that things will turn out  okay for her sister, and reminded her of steps she can take to help support her sister.  Kiernan reported that her sister also has a good attitude about the situation, so she will try to be more optimistic.  Amela stated I know she is gonna be alright.  Clinician will continue to monitor.          Plan: Follow up again in 1-2 weeks.    Diagnosis: Major depressive disorder, recurrent, moderate; Generalized Anxiety Disorder; and PTSD    Collaboration of Care:   No collaboration of care required for this visit.                                                   Patient/Guardian was advised Release of Information must be obtained prior to any record release in order to collaborate their care with an outside provider. Patient/Guardian was advised if they have not already done so to contact the registration department to sign all necessary forms in order for us  to release information regarding their care.    Consent: Patient/Guardian gives verbal consent for treatment and assignment of benefits for services provided during this visit. Patient/Guardian expressed understanding and agreed to proceed.   [name], [place], LCAS 09/30/24

## 2024-10-01 ENCOUNTER — Telehealth: Payer: Self-pay | Admitting: Podiatry

## 2024-10-01 NOTE — Telephone Encounter (Signed)
 DOS- 10/12/2024  REPAIR POST TIBIAL TENDON LT- 72340 SURGICAL DEBRIDEMENT DURING ANKLE ARTHROSCOPY LT- 29898  [facility] EFFECTIVE DATE- 09/17/2024  DEDUCTIBLE- $283 REMAINING- $283 OOP- $9250 REMAINING- $9250 COINSURANCE- 20%  PER [employer] PORTAL, PRIOR AUTH IS NOT REQUIRED FOR CPT CODES 72340 AND [phone]. DECISION ID# [id number]

## 2024-10-12 ENCOUNTER — Ambulatory Visit (INDEPENDENT_AMBULATORY_CARE_PROVIDER_SITE_OTHER): Admitting: Licensed Clinical Social Worker

## 2024-10-12 DIAGNOSIS — F331 Major depressive disorder, recurrent, moderate: Secondary | ICD-10-CM

## 2024-10-12 DIAGNOSIS — F4310 Post-traumatic stress disorder, unspecified: Secondary | ICD-10-CM | POA: Diagnosis not present

## 2024-10-12 DIAGNOSIS — F411 Generalized anxiety disorder: Secondary | ICD-10-CM | POA: Diagnosis not present

## 2024-10-12 NOTE — Progress Notes (Signed)
 Virtual Visit via Video Note   I connected with Pariss S. Louth on 10/12/24 at 3:00pm by video enabled telemedicine application and verified that I am speaking with the correct person using two identifiers.   I discussed the limitations, risks, security and privacy concerns of performing an evaluation and management service by video and the availability of in person appointments. I also discussed with the patient that there may be a patient responsible charge related to this service. The patient expressed understanding and agreed to proceed.   I discussed the assessment and treatment plan with the patient. The patient was provided an opportunity to ask questions and all were answered. The patient agreed with the plan and demonstrated an understanding of the instructions.   The patient was advised to call back or seek an in-person evaluation if the symptoms worsen or if the condition fails to improve as anticipated.  I provided 41 minutes of non-face-to-face time during this encounter.   [name], LCSW, LCAS _______________________ THERAPIST PROGRESS NOTE   Session Time:  3:00pm - 3:41pm       Location: Patient: Patient home Provider: Home Office   Participation Level: Active    Behavioral Response: Alert, casually dressed, euthymic mood/affect   Type of Therapy:  Individual Therapy   Treatment Goals addressed: Mood management; Medication management   Progress Towards Goals: Progressing   Interventions: CBT: self-care assessment    Summary: Donja S. Dia is a 59 year old divorced African American female that presented for therapy appointment today with diagnoses of Major Depressive Disorder, recurrent, moderate, Generalized Anxiety Disorder and PTSD.      Suicidal/Homicidal: None; without plan or intent                                                                                                                Therapist Response:  Clinician met with Caysie for virtual therapy  appointment and assessed for safety, sobriety, and medication compliance.  Alek presented for session on time and was alert, oriented x5, with no evidence or self-report of active SI/HI or A/V H.  Alane reported ongoing compliance with medication and denied any abuse of alcohol or illicit substances.  Clinician inquired about Ayse's current emotional ratings, as well as any significant changes in thoughts, feelings, or behavior since previous check-in.  Jillianna reported scores of 0/10 for depression, 0/10 for anxiety, and 0/10 for anger/irritability.  She denied any recent panic attacks or outbursts. 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  She reported that a struggle has been balancing her schedule at times, stating I've been back and forth to doctors and trying to get my health together.  Clinician revisited topic of self-care today.  Clinician explained how this can be defined as the things one does to maintain good health and improve well-being.  Clinician provided Skylynn with a self-care assessment form to complete.  This handout featured various sub-categories of self-care, including physical, psychological/emotional, social, spiritual, and professional.  Jakia was  asked to rank her engagement in the activities listed for each dimension on a scale of 1-3, with 1 indicating 'Poor', 2 indicating 'Ok', and 3 indicating 'Well'.  Clinician invited Maebelle to share results of her assessment, and inquired about which areas of self-care she is doing well in, as well as areas that require attention, and how she plans to begin addressing this during treatment.  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  Clinician will continue to monitor.          Plan: Follow up again in 1-2 weeks.    Diagnosis: Major depressive disorder, recurrent, moderate; Generalized Anxiety Disorder; and PTSD    Collaboration of Care:   No collaboration of care required for this visit.                                                   Patient/Guardian was advised Release of Information must be obtained prior to any record release in order to collaborate their care with an outside provider. Patient/Guardian was advised if they have not already done so to contact the registration department to sign all necessary forms in order for us  to release information regarding their care.    Consent: Patient/Guardian gives verbal consent for treatment and assignment of benefits for services provided during this visit. Patient/Guardian expressed understanding and agreed to proceed.   [name], LCSW, LCAS 10/12/24
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
## 2024-10-16 NOTE — Telephone Encounter (Signed)
**  SURGERY MOVED TO 10/26/2024**  REPAIR POST TIBIAL TENDON LT- 72340 SURGICAL DEBRIDEMENT DURING ANKLE ARTHROSCOPY LT- 29898   [facility] EFFECTIVE DATE- 09/17/2024   DEDUCTIBLE- $283 REMAINING- $283 OOP- $9250 REMAINING- $9250 COINSURANCE- 20%   PER [employer] PORTAL, PRIOR AUTH IS NOT REQUIRED FOR CPT CODES 72340. DECISION ID# [id number] PER [employer] PORTAL, PRIOR AUTH FOR CPT CODE 70101 HAS BEEN APPROVED FROM 10/26/2024-01/24/2025. AUTH# [id number]

## 2024-10-19 ENCOUNTER — Ambulatory Visit (INDEPENDENT_AMBULATORY_CARE_PROVIDER_SITE_OTHER): Admitting: Licensed Clinical Social Worker

## 2024-10-19 DIAGNOSIS — F4310 Post-traumatic stress disorder, unspecified: Secondary | ICD-10-CM

## 2024-10-19 DIAGNOSIS — F331 Major depressive disorder, recurrent, moderate: Secondary | ICD-10-CM | POA: Diagnosis not present

## 2024-10-19 DIAGNOSIS — F411 Generalized anxiety disorder: Secondary | ICD-10-CM | POA: Diagnosis not present

## 2024-10-19 NOTE — Progress Notes (Signed)
 Virtual Visit via Video Note   I connected with Meghan [name] on 10/19/24 at 3:00pm by video enabled telemedicine application and verified that I am speaking with the correct person using two identifiers.   I discussed the limitations, risks, security and privacy concerns of performing an evaluation and management service by video and the availability of in person appointments. I also discussed with the patient that there may be a patient responsible charge related to this service. The patient expressed understanding and agreed to proceed.   I discussed the assessment and treatment plan with the patient. The patient was provided an opportunity to ask questions and all were answered. The patient agreed with the plan and demonstrated an understanding of the instructions.   The patient was advised to call back or seek an in-person evaluation if the symptoms worsen or if the condition fails to improve as anticipated.   I provided 1 hour of non-face-to-face time during this encounter.   [name], LCSW, LCAS _______________________ THERAPIST PROGRESS NOTE   Session Time:  3:00pm - 4:00pm    Location: Patient: Patient home Provider: Home Office   Participation Level: Active    Behavioral Response: Alert, casually dressed, depressed mood/affect   Type of Therapy:  Individual Therapy   Treatment Goals addressed: Mood management; Medication management   Progress Towards Goals: Progressing   Interventions: CBT, supportive therapy    Summary: Meghan [name] is a 59 year old divorced African American female that presented for therapy appointment today with diagnoses of Major Depressive Disorder, recurrent, moderate, Generalized Anxiety Disorder and PTSD.      Suicidal/Homicidal: None; without plan or intent                                                                                                                Therapist Response:  Clinician met with Ebany for virtual therapy session and  assessed for safety, sobriety, and medication compliance.  Chariah presented for appointment on time and was alert, oriented x5, with no evidence or self-report of active SI/HI or A/V H.  Rafia reported ongoing compliance with medication and denied any abuse of alcohol or illicit substances.  Clinician inquired about Mayela's emotional ratings today, as well as any significant changes in thoughts, feelings, or behavior since last check-in.  Trenae reported scores of 3/10 for depression, 0/10 for anxiety, and 0/10 for anger/irritability.  She denied any recent panic attacks. Olubunmi reported that a recent struggle has been worrying about a family member that might pass away soon due to recurring hospitalizations.  Clinician expressed sympathy for Lilybelle's situation, and revisited psychoeducation on the 5 stages of grief, including denial, anger, bargaining, depression, and acceptance.  Clinician discussed how each stage can affect an individual, and provided strategies on how to faciliate healthy grieving as she processes this loss. Strategies provided to [facility] included taking time to allow healthy emotional expression (I.e. allowing oneself to cry when appropriate), engaging in healthy self-care activities for distraction, talking to people who can relate to the  loss for support, and considering ways to make the most of remaining time with this family member as their health deteriorates.  Interventions were effective, as evidenced by [name] actively engaging in discussion on topic and reporting that she has noticed changes in mood lately due to news of her aunt's ill health.  Kadee reported that she had an outburst toward her kids the other day when she felt they weren't taking the situation as seriously, and appeared disrespectful.  She reported that she has felt depressed more often than not, and crying frequently, stating I know what's coming and I'm not ready for it.  Emori reported that she feels like her aunt has  come to accept her declining health and potential death more readily than she has, which is also saddening, stating She told everybody that shes ready to go home.  Annabell reported that they have some events planned to make the most of remaining time together, including a family photoshoot when everyone can travel together to visit.  Clinician will continue to monitor.          Plan: Follow up again in 1-2 weeks.    Diagnosis: Major depressive disorder, recurrent, moderate; Generalized Anxiety Disorder; and PTSD    Collaboration of Care:   No collaboration of care required for this visit.                                                   Patient/Guardian was advised Release of Information must be obtained prior to any record release in order to collaborate their care with an outside provider. Patient/Guardian was advised if they have not already done so to contact the registration department to sign all necessary forms in order for us  to release information regarding their care.    Consent: Patient/Guardian gives verbal consent for treatment and assignment of benefits for services provided during this visit. Patient/Guardian expressed understanding and agreed to proceed.   [name], LCSW, LCAS 10/19/24

## 2024-10-21 ENCOUNTER — Encounter: Admitting: Podiatry

## 2024-10-21 ENCOUNTER — Encounter

## 2024-10-23 ENCOUNTER — Other Ambulatory Visit: Payer: Self-pay

## 2024-10-23 NOTE — Telephone Encounter (Signed)
 Last Fill: 07/27/2024  Next Visit: 12/15/2024  Last Visit: 09/14/2024  Dx: not discussed   Current Dose per office note on 09/14/2024: not discussed  Okay to refill Pantoprazole ?

## 2024-11-02 ENCOUNTER — Ambulatory Visit (HOSPITAL_COMMUNITY): Admitting: Licensed Clinical Social Worker

## 2024-11-04 ENCOUNTER — Encounter: Admitting: Podiatry

## 2024-11-04 ENCOUNTER — Encounter

## 2024-11-16 ENCOUNTER — Ambulatory Visit (HOSPITAL_COMMUNITY): Admitting: Licensed Clinical Social Worker

## 2024-11-18 ENCOUNTER — Encounter: Admitting: Podiatry

## 2024-11-30 ENCOUNTER — Ambulatory Visit (HOSPITAL_COMMUNITY): Admitting: Licensed Clinical Social Worker

## 2024-12-14 ENCOUNTER — Ambulatory Visit (HOSPITAL_COMMUNITY): Admitting: Licensed Clinical Social Worker

## 2024-12-15 ENCOUNTER — Ambulatory Visit

## 2024-12-28 ENCOUNTER — Ambulatory Visit (HOSPITAL_COMMUNITY): Admitting: Licensed Clinical Social Worker

## 2025-01-11 ENCOUNTER — Ambulatory Visit (HOSPITAL_COMMUNITY): Admitting: Licensed Clinical Social Worker

## 2025-01-22 ENCOUNTER — Ambulatory Visit: Admitting: Podiatry

## 2025-01-25 ENCOUNTER — Ambulatory Visit (HOSPITAL_COMMUNITY): Admitting: Licensed Clinical Social Worker

## 2025-02-08 ENCOUNTER — Ambulatory Visit (HOSPITAL_COMMUNITY): Admitting: Licensed Clinical Social Worker
